# Patient Record
Sex: Male | Born: 1935 | Race: White | Hispanic: No | Smoking: Former smoker
Health system: Southern US, Community
[De-identification: ages and names within clinical notes are randomized; demographics above are authoritative.]

## PROBLEM LIST (undated history)

## (undated) DIAGNOSIS — I2510 Atherosclerotic heart disease of native coronary artery without angina pectoris: Secondary | ICD-10-CM

## (undated) DIAGNOSIS — N189 Chronic kidney disease, unspecified: Secondary | ICD-10-CM

## (undated) DIAGNOSIS — E785 Hyperlipidemia, unspecified: Secondary | ICD-10-CM

## (undated) DIAGNOSIS — I499 Cardiac arrhythmia, unspecified: Secondary | ICD-10-CM

## (undated) DIAGNOSIS — Z950 Presence of cardiac pacemaker: Secondary | ICD-10-CM

## (undated) DIAGNOSIS — E119 Type 2 diabetes mellitus without complications: Secondary | ICD-10-CM

## (undated) DIAGNOSIS — M1990 Unspecified osteoarthritis, unspecified site: Secondary | ICD-10-CM

## (undated) DIAGNOSIS — F419 Anxiety disorder, unspecified: Secondary | ICD-10-CM

## (undated) DIAGNOSIS — I219 Acute myocardial infarction, unspecified: Secondary | ICD-10-CM

## (undated) DIAGNOSIS — I10 Essential (primary) hypertension: Secondary | ICD-10-CM

## (undated) HISTORY — PX: CHOLECYSTECTOMY: SHX55

## (undated) HISTORY — PX: PACEMAKER INSERTION: SHX728

## (undated) HISTORY — PX: HEMORROIDECTOMY: SUR656

## (undated) HISTORY — PX: CORONARY ANGIOPLASTY: SHX604

---

## 2004-02-17 HISTORY — PX: KNEE SURGERY: SHX244

## 2012-02-17 DIAGNOSIS — I219 Acute myocardial infarction, unspecified: Secondary | ICD-10-CM

## 2012-02-17 HISTORY — DX: Acute myocardial infarction, unspecified: I21.9

## 2013-09-27 ENCOUNTER — Encounter (HOSPITAL_BASED_OUTPATIENT_CLINIC_OR_DEPARTMENT_OTHER): Payer: Self-pay | Admitting: Emergency Medicine

## 2013-09-27 ENCOUNTER — Emergency Department (HOSPITAL_BASED_OUTPATIENT_CLINIC_OR_DEPARTMENT_OTHER)
Admission: EM | Admit: 2013-09-27 | Discharge: 2013-09-27 | Disposition: A | Discharge status: Home / Self Care | Payer: Medicare Other | Attending: Emergency Medicine | Admitting: Emergency Medicine

## 2013-09-27 DIAGNOSIS — M25569 Pain in unspecified knee: Secondary | ICD-10-CM | POA: Diagnosis not present

## 2013-09-27 DIAGNOSIS — E119 Type 2 diabetes mellitus without complications: Secondary | ICD-10-CM | POA: Diagnosis not present

## 2013-09-27 DIAGNOSIS — I10 Essential (primary) hypertension: Secondary | ICD-10-CM | POA: Insufficient documentation

## 2013-09-27 DIAGNOSIS — Z7901 Long term (current) use of anticoagulants: Secondary | ICD-10-CM | POA: Insufficient documentation

## 2013-09-27 DIAGNOSIS — M25561 Pain in right knee: Secondary | ICD-10-CM

## 2013-09-27 DIAGNOSIS — E785 Hyperlipidemia, unspecified: Secondary | ICD-10-CM | POA: Diagnosis not present

## 2013-09-27 DIAGNOSIS — Z87891 Personal history of nicotine dependence: Secondary | ICD-10-CM | POA: Diagnosis not present

## 2013-09-27 DIAGNOSIS — I252 Old myocardial infarction: Secondary | ICD-10-CM | POA: Insufficient documentation

## 2013-09-27 DIAGNOSIS — Z79899 Other long term (current) drug therapy: Secondary | ICD-10-CM | POA: Diagnosis not present

## 2013-09-27 DIAGNOSIS — Z7902 Long term (current) use of antithrombotics/antiplatelets: Secondary | ICD-10-CM | POA: Diagnosis not present

## 2013-09-27 DIAGNOSIS — G8929 Other chronic pain: Secondary | ICD-10-CM | POA: Insufficient documentation

## 2013-09-27 DIAGNOSIS — F411 Generalized anxiety disorder: Secondary | ICD-10-CM | POA: Diagnosis not present

## 2013-09-27 HISTORY — DX: Anxiety disorder, unspecified: F41.9

## 2013-09-27 HISTORY — DX: Acute myocardial infarction, unspecified: I21.9

## 2013-09-27 HISTORY — DX: Type 2 diabetes mellitus without complications: E11.9

## 2013-09-27 HISTORY — DX: Essential (primary) hypertension: I10

## 2013-09-27 HISTORY — DX: Hyperlipidemia, unspecified: E78.5

## 2013-09-27 MED ORDER — OXYCODONE-ACETAMINOPHEN 5-325 MG PO TABS: 1.0000 | ORAL_TABLET | Freq: Four times a day (QID) | ORAL | Status: DC | PRN

## 2013-09-27 MED ORDER — OXYCODONE-ACETAMINOPHEN 5-325 MG PO TABS
1.0000 | ORAL_TABLET | Freq: Once | ORAL | Status: AC
  Administered 2013-09-27: 1 via ORAL
  Filled 2013-09-27: qty 1

## 2013-09-27 MED ORDER — NAPROXEN 250 MG PO TABS
500.0000 mg | ORAL_TABLET | Freq: Once | ORAL | Status: AC
  Administered 2013-09-27: 500 mg via ORAL
  Filled 2013-09-27: qty 2

## 2013-09-27 NOTE — ED Notes (Signed)
Pt is unable to ambulate and transfer to wheelchair and van.  [facility] called for transport.  Caregiver at bedside.

## 2013-09-27 NOTE — ED Notes (Signed)
Right knee pain that is chronic.  States pain kept him awake last night.

## 2013-09-27 NOTE — ED Provider Notes (Signed)
CSN: [id number]     Arrival date & time 09/27/13  1114 History   First MD Initiated Contact with Patient 09/27/13 1116     Chief Complaint  Patient presents with  . Knee Pain     (Consider location/radiation/quality/duration/timing/severity/associated sxs/prior Treatment) The history is provided by the patient.   patient has a history of a total right knee.  He reports chronic pain in his right knee over the past several months.  He states his pain exacerbated last night and was unrelieved by his Tylenol No. 3 that he usually takes for pain.  No recent illness.  No recent injury.  Denies fevers or chills.  Denies redness or skin changes.  Denies numbness or tingling distal to the right knee.  Denies new weakness of his right lower extremity.  No other complaints  Past Medical History  Diagnosis Date  . Diabetes mellitus without complication   . Anxiety   . MI (myocardial infarction)   . Hypertension   . Hyperlipidemia    Past Surgical History  Procedure Laterality Date  . Knee surgery    . Cholecystectomy    . Hemorroidectomy     No family history on file. History  Substance Use Topics  . Smoking status: Former Research scientist (life sciences)  . Smokeless tobacco: Not on file  . Alcohol Use: No    Review of Systems  All other systems reviewed and are negative.     Allergies  Review of patient's allergies indicates not on file.  Home Medications   Prior to Admission medications   Medication Sig Start Date End Date Taking? Authorizing Provider  acetaminophen-codeine (TYLENOL #3) 300-30 MG per tablet Take by mouth 2 (two) times daily as needed for moderate pain.   Yes Historical Provider, MD  atorvastatin (LIPITOR) 10 MG tablet Take 10 mg by mouth daily.   Yes Historical Provider, MD  busPIRone (BUSPAR) 15 MG tablet Take 15 mg by mouth 3 (three) times daily.   Yes Historical Provider, MD  carvedilol (COREG) 3.125 MG tablet Take 3.125 mg by mouth 2 (two) times daily with a meal.   Yes  Historical Provider, MD  clopidogrel (PLAVIX) 75 MG tablet Take 75 mg by mouth daily.   Yes Historical Provider, MD  ranitidine (ZANTAC) 150 MG capsule Take 150 mg by mouth 2 (two) times daily.   Yes Historical Provider, MD  warfarin (COUMADIN) 5 MG tablet Take 5 mg by mouth daily.   Yes Historical Provider, MD  oxyCODONE-acetaminophen (PERCOCET/ROXICET) 5-325 MG per tablet Take 1 tablet by mouth every 6 (six) hours as needed for severe pain. 09/27/13   Hoy Morn, MD   BP 130/64  Pulse 74  Temp(Src) 98.3 F (36.8 C) (Oral)  Resp 16  SpO2 100% Physical Exam  Nursing note and vitals reviewed. Constitutional: He is oriented to person, place, and time. He appears well-developed and well-nourished.  HENT:  Head: Normocephalic.  Eyes: EOM are normal.  Neck: Normal range of motion.  Pulmonary/Chest: Effort normal.  Abdominal: He exhibits no distension.  Musculoskeletal:  Mild to moderate joint effusion of the right knee.  No erythema or warmth.  Limited range of motion secondary to pain.  Neurological: He is alert and oriented to person, place, and time.  Psychiatric: He has a normal mood and affect.    ED Course  Procedures (including critical care time) Labs Review Labs Reviewed - No data to display  Imaging Review No results found.   EKG Interpretation None  MDM   Final diagnoses:  Chronic knee pain, right    Exacerbation of chronic pain.  No recent injury or trauma.  No indication for imaging.  Pain treated.  Home with a short course of Percocet.  I've asked that he call his orthopedic surgeon for followup    Hoy Morn, MD 09/27/13 1136

## 2013-09-27 NOTE — ED Notes (Signed)
[facility] at bedside for transport.  

## 2013-09-27 NOTE — ED Notes (Signed)
[facility] at bedside preparing for transport.

## 2013-12-19 ENCOUNTER — Inpatient Hospital Stay (HOSPITAL_COMMUNITY)
Admission: EM | Admit: 2013-12-19 | Discharge: 2013-12-23 | DRG: 690 | Disposition: A | Discharge status: Home Health | Payer: Medicare Other | Attending: Internal Medicine | Admitting: Internal Medicine

## 2013-12-19 ENCOUNTER — Encounter (HOSPITAL_COMMUNITY): Payer: Self-pay | Admitting: *Deleted

## 2013-12-19 DIAGNOSIS — N390 Urinary tract infection, site not specified: Secondary | ICD-10-CM | POA: Diagnosis not present

## 2013-12-19 DIAGNOSIS — Z7901 Long term (current) use of anticoagulants: Secondary | ICD-10-CM

## 2013-12-19 DIAGNOSIS — N179 Acute kidney failure, unspecified: Secondary | ICD-10-CM | POA: Diagnosis present

## 2013-12-19 DIAGNOSIS — B9620 Unspecified Escherichia coli [E. coli] as the cause of diseases classified elsewhere: Secondary | ICD-10-CM | POA: Diagnosis present

## 2013-12-19 DIAGNOSIS — I10 Essential (primary) hypertension: Secondary | ICD-10-CM | POA: Diagnosis present

## 2013-12-19 DIAGNOSIS — E785 Hyperlipidemia, unspecified: Secondary | ICD-10-CM | POA: Diagnosis present

## 2013-12-19 DIAGNOSIS — L89151 Pressure ulcer of sacral region, stage 1: Secondary | ICD-10-CM | POA: Diagnosis present

## 2013-12-19 DIAGNOSIS — Z86718 Personal history of other venous thrombosis and embolism: Secondary | ICD-10-CM

## 2013-12-19 DIAGNOSIS — Z950 Presence of cardiac pacemaker: Secondary | ICD-10-CM

## 2013-12-19 DIAGNOSIS — Z79899 Other long term (current) drug therapy: Secondary | ICD-10-CM

## 2013-12-19 DIAGNOSIS — E860 Dehydration: Secondary | ICD-10-CM | POA: Diagnosis present

## 2013-12-19 DIAGNOSIS — K219 Gastro-esophageal reflux disease without esophagitis: Secondary | ICD-10-CM | POA: Diagnosis present

## 2013-12-19 DIAGNOSIS — R1030 Lower abdominal pain, unspecified: Secondary | ICD-10-CM | POA: Diagnosis not present

## 2013-12-19 DIAGNOSIS — R739 Hyperglycemia, unspecified: Secondary | ICD-10-CM | POA: Diagnosis present

## 2013-12-19 DIAGNOSIS — E1121 Type 2 diabetes mellitus with diabetic nephropathy: Secondary | ICD-10-CM | POA: Diagnosis present

## 2013-12-19 DIAGNOSIS — R791 Abnormal coagulation profile: Secondary | ICD-10-CM | POA: Diagnosis present

## 2013-12-19 DIAGNOSIS — Z7902 Long term (current) use of antithrombotics/antiplatelets: Secondary | ICD-10-CM

## 2013-12-19 DIAGNOSIS — R7989 Other specified abnormal findings of blood chemistry: Secondary | ICD-10-CM | POA: Diagnosis present

## 2013-12-19 DIAGNOSIS — I2510 Atherosclerotic heart disease of native coronary artery without angina pectoris: Secondary | ICD-10-CM | POA: Diagnosis present

## 2013-12-19 DIAGNOSIS — I252 Old myocardial infarction: Secondary | ICD-10-CM

## 2013-12-19 DIAGNOSIS — R531 Weakness: Secondary | ICD-10-CM

## 2013-12-19 DIAGNOSIS — F419 Anxiety disorder, unspecified: Secondary | ICD-10-CM | POA: Diagnosis present

## 2013-12-19 DIAGNOSIS — L8990 Pressure ulcer of unspecified site, unspecified stage: Secondary | ICD-10-CM | POA: Diagnosis present

## 2013-12-19 DIAGNOSIS — E119 Type 2 diabetes mellitus without complications: Secondary | ICD-10-CM

## 2013-12-19 NOTE — ED Notes (Signed)
CBG 207, HR 60-70's, BP 132/65

## 2013-12-19 NOTE — ED Notes (Signed)
Per EMS pt from assisted living, staff reported pt started acting different, reported had dark/cloudy urine, today is having generalized weakness and no appetite today, then this evening noticed pt was incontinent in bed which is not normal and diaphoretic, pt report some abdominal pain, pt has UTI smell per EMS.

## 2013-12-20 ENCOUNTER — Inpatient Hospital Stay (HOSPITAL_COMMUNITY): Payer: Medicare Other

## 2013-12-20 ENCOUNTER — Encounter (HOSPITAL_COMMUNITY): Payer: Self-pay

## 2013-12-20 ENCOUNTER — Emergency Department (HOSPITAL_COMMUNITY): Payer: Medicare Other

## 2013-12-20 DIAGNOSIS — I252 Old myocardial infarction: Secondary | ICD-10-CM

## 2013-12-20 DIAGNOSIS — Z7902 Long term (current) use of antithrombotics/antiplatelets: Secondary | ICD-10-CM | POA: Diagnosis not present

## 2013-12-20 DIAGNOSIS — R7989 Other specified abnormal findings of blood chemistry: Secondary | ICD-10-CM | POA: Diagnosis present

## 2013-12-20 DIAGNOSIS — Z7901 Long term (current) use of anticoagulants: Secondary | ICD-10-CM | POA: Diagnosis not present

## 2013-12-20 DIAGNOSIS — K219 Gastro-esophageal reflux disease without esophagitis: Secondary | ICD-10-CM | POA: Diagnosis present

## 2013-12-20 DIAGNOSIS — I10 Essential (primary) hypertension: Secondary | ICD-10-CM | POA: Diagnosis present

## 2013-12-20 DIAGNOSIS — Z79899 Other long term (current) drug therapy: Secondary | ICD-10-CM | POA: Diagnosis not present

## 2013-12-20 DIAGNOSIS — R739 Hyperglycemia, unspecified: Secondary | ICD-10-CM | POA: Diagnosis present

## 2013-12-20 DIAGNOSIS — Z950 Presence of cardiac pacemaker: Secondary | ICD-10-CM | POA: Diagnosis not present

## 2013-12-20 DIAGNOSIS — I219 Acute myocardial infarction, unspecified: Secondary | ICD-10-CM | POA: Insufficient documentation

## 2013-12-20 DIAGNOSIS — N390 Urinary tract infection, site not specified: Principal | ICD-10-CM | POA: Diagnosis present

## 2013-12-20 DIAGNOSIS — Z86718 Personal history of other venous thrombosis and embolism: Secondary | ICD-10-CM

## 2013-12-20 DIAGNOSIS — E785 Hyperlipidemia, unspecified: Secondary | ICD-10-CM | POA: Diagnosis present

## 2013-12-20 DIAGNOSIS — R791 Abnormal coagulation profile: Secondary | ICD-10-CM | POA: Diagnosis present

## 2013-12-20 DIAGNOSIS — R531 Weakness: Secondary | ICD-10-CM

## 2013-12-20 DIAGNOSIS — F419 Anxiety disorder, unspecified: Secondary | ICD-10-CM | POA: Diagnosis present

## 2013-12-20 DIAGNOSIS — N179 Acute kidney failure, unspecified: Secondary | ICD-10-CM | POA: Diagnosis present

## 2013-12-20 DIAGNOSIS — L8990 Pressure ulcer of unspecified site, unspecified stage: Secondary | ICD-10-CM | POA: Diagnosis present

## 2013-12-20 DIAGNOSIS — L89151 Pressure ulcer of sacral region, stage 1: Secondary | ICD-10-CM | POA: Diagnosis present

## 2013-12-20 DIAGNOSIS — E1121 Type 2 diabetes mellitus with diabetic nephropathy: Secondary | ICD-10-CM | POA: Diagnosis present

## 2013-12-20 DIAGNOSIS — R1030 Lower abdominal pain, unspecified: Secondary | ICD-10-CM | POA: Diagnosis present

## 2013-12-20 DIAGNOSIS — B9620 Unspecified Escherichia coli [E. coli] as the cause of diseases classified elsewhere: Secondary | ICD-10-CM | POA: Diagnosis present

## 2013-12-20 DIAGNOSIS — E860 Dehydration: Secondary | ICD-10-CM | POA: Diagnosis present

## 2013-12-20 DIAGNOSIS — I2510 Atherosclerotic heart disease of native coronary artery without angina pectoris: Secondary | ICD-10-CM | POA: Diagnosis present

## 2013-12-20 LAB — URINALYSIS, ROUTINE W REFLEX MICROSCOPIC
Bilirubin Urine: NEGATIVE
Glucose, UA: NEGATIVE mg/dL
Ketones, ur: NEGATIVE mg/dL
Nitrite: POSITIVE — AB
Protein, ur: 30 mg/dL — AB
SPECIFIC GRAVITY, URINE: 1.018 (ref 1.005–1.030)
UROBILINOGEN UA: 0.2 mg/dL (ref 0.0–1.0)
pH: 5 (ref 5.0–8.0)

## 2013-12-20 LAB — COMPREHENSIVE METABOLIC PANEL
ALT: 16 U/L (ref 0–53)
AST: 20 U/L (ref 0–37)
Albumin: 3.2 g/dL — ABNORMAL LOW (ref 3.5–5.2)
Alkaline Phosphatase: 77 U/L (ref 39–117)
Anion gap: 15 (ref 5–15)
BUN: 24 mg/dL — ABNORMAL HIGH (ref 6–23)
CO2: 22 mEq/L (ref 19–32)
Calcium: 9.6 mg/dL (ref 8.4–10.5)
Chloride: 100 mEq/L (ref 96–112)
Creatinine, Ser: 1.52 mg/dL — ABNORMAL HIGH (ref 0.50–1.35)
GFR calc non Af Amer: 39 mL/min — ABNORMAL LOW (ref >90)
GFR, EST AFRICAN AMERICAN: 46 mL/min — AB (ref >90)
GLUCOSE: 189 mg/dL — AB (ref 70–99)
Potassium: 4.7 mEq/L (ref 3.7–5.3)
Sodium: 137 mEq/L (ref 137–147)
TOTAL PROTEIN: 8 g/dL (ref 6.0–8.3)
Total Bilirubin: 0.4 mg/dL (ref 0.3–1.2)

## 2013-12-20 LAB — CBC
HCT: 38.8 % — ABNORMAL LOW (ref 39.0–52.0)
HEMOGLOBIN: 12.7 g/dL — AB (ref 13.0–17.0)
MCH: 27.5 pg (ref 26.0–34.0)
MCHC: 32.7 g/dL (ref 30.0–36.0)
MCV: 84.2 fL (ref 78.0–100.0)
Platelets: 291 10*3/uL (ref 150–400)
RBC: 4.61 MIL/uL (ref 4.22–5.81)
RDW: 15.8 % — ABNORMAL HIGH (ref 11.5–15.5)
WBC: 6.4 10*3/uL (ref 4.0–10.5)

## 2013-12-20 LAB — PROTIME-INR
INR: 1.41 (ref 0.00–1.49)
INR: 1.43 (ref 0.00–1.49)
PROTHROMBIN TIME: 17.4 s — AB (ref 11.6–15.2)
Prothrombin Time: 17.6 seconds — ABNORMAL HIGH (ref 11.6–15.2)

## 2013-12-20 LAB — URINE MICROSCOPIC-ADD ON

## 2013-12-20 LAB — GLUCOSE, CAPILLARY
GLUCOSE-CAPILLARY: 191 mg/dL — AB (ref 70–99)
GLUCOSE-CAPILLARY: 168 mg/dL — AB (ref 70–99)
Glucose-Capillary: 165 mg/dL — ABNORMAL HIGH (ref 70–99)

## 2013-12-20 LAB — I-STAT CG4 LACTIC ACID, ED: Lactic Acid, Venous: 1.72 mmol/L (ref 0.5–2.2)

## 2013-12-20 LAB — SODIUM, URINE, RANDOM: SODIUM UR: 84 meq/L

## 2013-12-20 LAB — CBC WITH DIFFERENTIAL/PLATELET
BASOS ABS: 0 10*3/uL (ref 0.0–0.1)
Basophils Relative: 1 % (ref 0–1)
EOS ABS: 0 10*3/uL (ref 0.0–0.7)
EOS PCT: 0 % (ref 0–5)
HCT: 40.2 % (ref 39.0–52.0)
Hemoglobin: 13.3 g/dL (ref 13.0–17.0)
Lymphocytes Relative: 19 % (ref 12–46)
Lymphs Abs: 1.3 10*3/uL (ref 0.7–4.0)
MCH: 27.5 pg (ref 26.0–34.0)
MCHC: 33.1 g/dL (ref 30.0–36.0)
MCV: 83.1 fL (ref 78.0–100.0)
Monocytes Absolute: 0.7 10*3/uL (ref 0.1–1.0)
Monocytes Relative: 10 % (ref 3–12)
Neutro Abs: 4.8 10*3/uL (ref 1.7–7.7)
Neutrophils Relative %: 70 % (ref 43–77)
PLATELETS: 325 10*3/uL (ref 150–400)
RBC: 4.84 MIL/uL (ref 4.22–5.81)
RDW: 15.8 % — AB (ref 11.5–15.5)
WBC: 6.8 10*3/uL (ref 4.0–10.5)

## 2013-12-20 LAB — PRO B NATRIURETIC PEPTIDE: PRO B NATRI PEPTIDE: 3532 pg/mL — AB (ref 0–450)

## 2013-12-20 LAB — CREATININE, URINE, RANDOM: Creatinine, Urine: 145.7 mg/dL

## 2013-12-20 LAB — BASIC METABOLIC PANEL
ANION GAP: 15 (ref 5–15)
BUN: 24 mg/dL — AB (ref 6–23)
CHLORIDE: 105 meq/L (ref 96–112)
CO2: 23 mEq/L (ref 19–32)
Calcium: 9.3 mg/dL (ref 8.4–10.5)
Creatinine, Ser: 1.66 mg/dL — ABNORMAL HIGH (ref 0.50–1.35)
GFR calc Af Amer: 41 mL/min — ABNORMAL LOW (ref >90)
GFR, EST NON AFRICAN AMERICAN: 35 mL/min — AB (ref >90)
Glucose, Bld: 158 mg/dL — ABNORMAL HIGH (ref 70–99)
POTASSIUM: 4.5 meq/L (ref 3.7–5.3)
Sodium: 143 mEq/L (ref 137–147)

## 2013-12-20 LAB — SEDIMENTATION RATE: Sed Rate: 40 mm/hr — ABNORMAL HIGH (ref 0–16)

## 2013-12-20 LAB — HEMOGLOBIN A1C
HEMOGLOBIN A1C: 7.7 % — AB (ref <5.7)
Mean Plasma Glucose: 174 mg/dL — ABNORMAL HIGH (ref <117)

## 2013-12-20 LAB — TROPONIN I

## 2013-12-20 LAB — APTT: aPTT: 35 seconds (ref 24–37)

## 2013-12-20 MED ORDER — BUSPIRONE HCL 15 MG PO TABS
15.0000 mg | ORAL_TABLET | Freq: Three times a day (TID) | ORAL | Status: DC
  Administered 2013-12-20 – 2013-12-23 (×11): 15 mg via ORAL
  Filled 2013-12-20 (×12): qty 1

## 2013-12-20 MED ORDER — CLOPIDOGREL BISULFATE 75 MG PO TABS
75.0000 mg | ORAL_TABLET | Freq: Every day | ORAL | Status: DC
  Administered 2013-12-20 – 2013-12-23 (×4): 75 mg via ORAL
  Filled 2013-12-20 (×4): qty 1

## 2013-12-20 MED ORDER — INSULIN ASPART 100 UNIT/ML ~~LOC~~ SOLN
0.0000 [IU] | Freq: Every day | SUBCUTANEOUS | Status: DC
  Administered 2013-12-20: 3 [IU] via SUBCUTANEOUS

## 2013-12-20 MED ORDER — FAMOTIDINE 20 MG PO TABS
20.0000 mg | ORAL_TABLET | Freq: Every day | ORAL | Status: DC
  Administered 2013-12-20 – 2013-12-23 (×4): 20 mg via ORAL
  Filled 2013-12-20 (×4): qty 1

## 2013-12-20 MED ORDER — INSULIN ASPART 100 UNIT/ML ~~LOC~~ SOLN
0.0000 [IU] | Freq: Three times a day (TID) | SUBCUTANEOUS | Status: DC
  Administered 2013-12-20 – 2013-12-21 (×5): 2 [IU] via SUBCUTANEOUS
  Administered 2013-12-22: 1 [IU] via SUBCUTANEOUS
  Administered 2013-12-22: 2 [IU] via SUBCUTANEOUS
  Administered 2013-12-22: 1 [IU] via SUBCUTANEOUS

## 2013-12-20 MED ORDER — ATORVASTATIN CALCIUM 10 MG PO TABS
10.0000 mg | ORAL_TABLET | Freq: Every day | ORAL | Status: DC
  Administered 2013-12-20 – 2013-12-22 (×3): 10 mg via ORAL
  Filled 2013-12-20 (×4): qty 1

## 2013-12-20 MED ORDER — CETYLPYRIDINIUM CHLORIDE 0.05 % MT LIQD
7.0000 mL | Freq: Two times a day (BID) | OROMUCOSAL | Status: DC
  Administered 2013-12-20 – 2013-12-23 (×7): 7 mL via OROMUCOSAL

## 2013-12-20 MED ORDER — ENOXAPARIN SODIUM 80 MG/0.8ML ~~LOC~~ SOLN
1.0000 mg/kg | SUBCUTANEOUS | Status: DC
  Administered 2013-12-20 – 2013-12-21 (×2): 75 mg via SUBCUTANEOUS
  Filled 2013-12-20 (×3): qty 0.8

## 2013-12-20 MED ORDER — ACETAMINOPHEN-CODEINE #3 300-30 MG PO TABS
1.0000 | ORAL_TABLET | Freq: Three times a day (TID) | ORAL | Status: DC | PRN
Start: 2013-12-20 — End: 2013-12-23
  Administered 2013-12-21: 1 via ORAL
  Filled 2013-12-20: qty 1

## 2013-12-20 MED ORDER — DEXTROSE 5 % IV SOLN
1.0000 g | Freq: Every day | INTRAVENOUS | Status: DC
  Administered 2013-12-20 – 2013-12-22 (×3): 1 g via INTRAVENOUS
  Filled 2013-12-20 (×5): qty 10

## 2013-12-20 MED ORDER — WARFARIN SODIUM 5 MG PO TABS
5.0000 mg | ORAL_TABLET | Freq: Once | ORAL | Status: AC
  Administered 2013-12-20: 5 mg via ORAL
  Filled 2013-12-20: qty 1

## 2013-12-20 MED ORDER — WARFARIN - PHARMACIST DOSING INPATIENT: Freq: Every day | Status: DC

## 2013-12-20 MED ORDER — CARVEDILOL 3.125 MG PO TABS
3.1250 mg | ORAL_TABLET | Freq: Two times a day (BID) | ORAL | Status: DC
  Administered 2013-12-20 – 2013-12-23 (×8): 3.125 mg via ORAL
  Filled 2013-12-20 (×9): qty 1

## 2013-12-20 MED ORDER — ENSURE COMPLETE PO LIQD
237.0000 mL | Freq: Two times a day (BID) | ORAL | Status: DC
  Administered 2013-12-20 – 2013-12-23 (×6): 237 mL via ORAL

## 2013-12-20 MED ORDER — DEXTROSE 5 % IV SOLN
1.0000 g | Freq: Once | INTRAVENOUS | Status: AC
  Administered 2013-12-20: 1 g via INTRAVENOUS
  Filled 2013-12-20: qty 10

## 2013-12-20 MED ORDER — OXYCODONE-ACETAMINOPHEN 5-325 MG PO TABS: 1.0000 | ORAL_TABLET | Freq: Four times a day (QID) | ORAL | Status: DC | PRN

## 2013-12-20 MED ORDER — HEPARIN (PORCINE) IN NACL 100-0.45 UNIT/ML-% IJ SOLN
1150.0000 [IU]/h | INTRAMUSCULAR | Status: DC
  Administered 2013-12-20: 1150 [IU]/h via INTRAVENOUS
  Filled 2013-12-20: qty 250

## 2013-12-20 MED ORDER — SODIUM CHLORIDE 0.9 % IV SOLN
INTRAVENOUS | Status: DC
  Administered 2013-12-20 – 2013-12-22 (×3): 1000 mL via INTRAVENOUS

## 2013-12-20 NOTE — Progress Notes (Signed)
INITIAL NUTRITION ASSESSMENT  DOCUMENTATION CODES Per approved criteria  -Not Applicable   INTERVENTION: Heart Healthy diet Ensure Complete po BID, each supplement provides 350 kcal and 13 grams of protein RD to follow.  NUTRITION DIAGNOSIS: Predicted sub optimal intake related to caring for himself as evidenced by weight loss.   Goal: Intake of meals and supplements to meet >90% estimated needs.  Monitor:  Intake, labs, weight trend  Reason for Assessment: low braden  80 y.o. male  Admitting Dx: Acute UTI  ASSESSMENT: Patient admitted with UTI.   Patient reports good intake currently and prior to admit. Good appetite.  Lives alone and cooks for self. Reports UBW of 200 lbs "maybe" but unable to state when Stage 1 persistent erythema at sacrum and buttocks.  Height: Ht Readings from Last 1 Encounters:  12/19/13 5\' 7"  (1.702 m)    Weight: Wt Readings from Last 1 Encounters:  12/19/13 170 lb (77.111 kg)    Ideal Body Weight: 148 lbs  % Ideal Body Weight: 115  Wt Readings from Last 10 Encounters:  12/19/13 170 lb (77.111 kg)    Usual Body Weight: 200 lbs  % Usual Body Weight: 85  BMI:  Body mass index is 26.62 kg/(m^2).  Estimated Nutritional Needs: Kcal: 2000-2100 Protein: 75-85 gm Fluid: >/=2L daily  Skin: stage 1 pressure ulcer, persistent erythema at sacrum and buttocks  Diet Order: Diet Heart  EDUCATION NEEDS: -No education needs identified at this time   Intake/Output Summary (Last 24 hours) at 12/20/13 1045 Last data filed at 12/20/13 0530  Gross per 24 hour  Intake    120 ml  Output    300 ml  Net   -180 ml     Labs:   Recent Labs Lab 12/20/13 0128 12/20/13 0725  NA 137 143  K 4.7 4.5  CL 100 105  CO2 22 23  BUN 24* 24*  CREATININE 1.52* 1.66*  CALCIUM 9.6 9.3  GLUCOSE 189* 158*    CBG (last 3)   Recent Labs  12/20/13 0737  GLUCAP 165*    Scheduled Meds: . antiseptic oral rinse  7 mL Mouth Rinse BID  .  atorvastatin  10 mg Oral q1800  . busPIRone  15 mg Oral TID  . carvedilol  3.125 mg Oral BID WC  . cefTRIAXone (ROCEPHIN)  IV  1 g Intravenous QHS  . clopidogrel  75 mg Oral Daily  . enoxaparin (LOVENOX) injection  1 mg/kg Subcutaneous Q24H  . famotidine  20 mg Oral Daily  . insulin aspart  0-5 Units Subcutaneous QHS  . insulin aspart  0-9 Units Subcutaneous TID WC  . Warfarin - Pharmacist Dosing Inpatient   Does not apply q1800    Continuous Infusions: . sodium chloride 100 mL/hr at 12/20/13 0500    Past Medical History  Diagnosis Date  . Diabetes mellitus without complication   . Anxiety   . MI (myocardial infarction)   . Hypertension   . Hyperlipidemia     Past Surgical History  Procedure Laterality Date  . Knee surgery    . Cholecystectomy    . Hemorroidectomy      Antonieta Iba, RD, LDN Clinical Inpatient Dietitian Pager:  [phone] Weekend and after hours pager:  [phone]

## 2013-12-20 NOTE — ED Notes (Addendum)
Enos Fling [phone] (sits with patient)

## 2013-12-20 NOTE — Progress Notes (Addendum)
ANTICOAGULATION CONSULT NOTE - Initial Consult  Pharmacy Consult for Lovenox, warfarin Indication: history of DVT  Not on File  Patient Measurements: Height: 5\' 7"  (170.2 cm) Weight: 170 lb (77.111 kg) IBW/kg (Calculated) : 66.1  Vital Signs: Temp: 98.3 F (36.8 C) (11/04 0512) Temp Source: Oral (11/04 0512) BP: 126/52 mmHg (11/04 0512) Pulse Rate: 69 (11/04 0512)  Labs:  Recent Labs  12/20/13 0128 12/20/13 0725  HGB 13.3 12.7*  HCT 40.2 38.8*  PLT 325 291  APTT 35  --   LABPROT 17.4* 17.6*  INR 1.41 1.43  CREATININE 1.52* 1.66*  TROPONINI <0.30  --     Estimated Creatinine Clearance: 29.3 mL/min (by C-G formula based on Cr of 1.66).   Medical History: Past Medical History  Diagnosis Date  . Diabetes mellitus without complication   . Anxiety   . MI (myocardial infarction)   . Hypertension   . Hyperlipidemia     Medications:  Scheduled:  . atorvastatin  10 mg Oral q1800  . busPIRone  15 mg Oral TID  . carvedilol  3.125 mg Oral BID WC  . cefTRIAXone (ROCEPHIN)  IV  1 g Intravenous QHS  . clopidogrel  75 mg Oral Daily  . famotidine  20 mg Oral Daily  . insulin aspart  0-5 Units Subcutaneous QHS  . insulin aspart  0-9 Units Subcutaneous TID WC  . Warfarin - Pharmacist Dosing Inpatient   Does not apply q1800   Infusions:  . sodium chloride      Assessment: 80 yo male presented to ER early this AM with lower abd pain. Per notes, caregiver states that he is not walking and eating well as usual. Also has chronic right leg edema and tenderness in lower leg. Patient found to have UTI started on antibiotics. Note that patient was on warfarin prior to admission and INR was subtherapeutic on admission. Started on IV heparin and now transitioning to full dose Lovenox until INR therapeutic   Warfarin dose PTA: 5mg  daily with last dose AM 11/3  INR subtherapeutic  CBC ok  No reported bleeding  Patient also with positive UA on Rocephin  Goal of Therapy:   INR 2-3 Anti-Xa level 0.6-1 units/ml 4hrs after LMWH dose given Monitor platelets by anticoagulation protocol: Yes   Plan:  1) D/C IV heparin 2) 1 hour after heparin stopped, start Lovenox 1mg /kg (75mg ) q24 due to CrCl < 30 ml/min at this point 3) Monitor renal function for any possible Lovenox dosage changes 4) D/C Lovenox when INR therapeutic 5) Warfarin 5mg  tonight 6) Daily INR  Adrian Saran, PharmD, BCPS Pager [phone] 12/20/2013 9:47 AM

## 2013-12-20 NOTE — H&P (Addendum)
[facility] History and Physical  Kristi Hyer DJT:[id number] DOB: February 19, 1924 DOA: 12/19/2013  Referring physician: ED physician PCP: No primary care provider on file.  Specialists:   Chief Complaint: lower abdominal pain  HPI: Randy [name] is a 80 y.o. 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  Patient reports that he started having suprapubic abdominal pain this morning. He denies dysuria or burning on urination. He states that he has been having urinary frequency for more than a month. He does not have fever, chills, nausea, vomiting, diarrhea. He denies chest pain, cough, shortness breath.  Per caregiver, pt is usually active, and able to ambulate, but today, he is not walking and eating well. Patient was drenched in sweats at one point. Patient has chronic right leg edema and tenderness in the right lower leg. Of note, he has hx of right leg DVT per patient. He is supposed to be on coumadin, but his INR is 1.41 on admission.   Patient is found to have positive urinalysis for UTI. No leukocytosis. Negative chest x-ray. He is admitted to inpatient for further evaluation and treatment.  Review of Systems: As presented in the history of presenting illness, rest negative.  Where does patient live? Lives at home  Can patient participate in ADLs? little  Allergy: Not on File  Past Medical History  Diagnosis Date  . Diabetes mellitus without complication   . Anxiety   . MI (myocardial infarction)   . Hypertension   . Hyperlipidemia     Past Surgical History  Procedure Laterality Date  . Knee surgery    . Cholecystectomy    . Hemorroidectomy      Social History:  reports that he has quit smoking. He does not have any smokeless tobacco history on file. He reports that he does not drink alcohol or use illicit drugs.  Family  History: No family history on file.   Prior to Admission medications   Medication Sig Start Date End Date Taking? Authorizing Provider  acetaminophen-codeine (TYLENOL #3) 300-30 MG per tablet Take by mouth 2 (two) times daily as needed for moderate pain.    Historical Provider, MD  atorvastatin (LIPITOR) 10 MG tablet Take 10 mg by mouth daily.    Historical Provider, MD  busPIRone (BUSPAR) 15 MG tablet Take 15 mg by mouth 3 (three) times daily.    Historical Provider, MD  carvedilol (COREG) 3.125 MG tablet Take 3.125 mg by mouth 2 (two) times daily with a meal.    Historical Provider, MD  clopidogrel (PLAVIX) 75 MG tablet Take 75 mg by mouth daily.    Historical Provider, MD  oxyCODONE-acetaminophen (PERCOCET/ROXICET) 5-325 MG per tablet Take 1 tablet by mouth every 6 (six) hours as needed for severe pain. 09/27/13   Hoy Morn, MD  ranitidine (ZANTAC) 150 MG capsule Take 150 mg by mouth 2 (two) times daily.    Historical Provider, MD  warfarin (COUMADIN) 5 MG tablet Take 5 mg by mouth daily.    Historical Provider, MD    Physical Exam: Filed Vitals:   12/19/13 2316  BP: 152/68  Pulse: 69  Temp: 98.6 F (37 C)  TempSrc: Oral  Resp: 18  Height: 5\' 7"  (1.702 m)  Weight: 77.111 kg (170 lb)  SpO2: 100%   General: Not in acute distress. Dry mucus and membrane HEENT:       Eyes: PERRL, EOMI,  no scleral icterus       ENT: No discharge from the ears and nose, no pharynx injection, no tonsillar enlargement.        Neck: No JVD, no bruit, no mass felt. Cardiac: remote heart sound S1/S2, RRR, No murmurs, gallops or rubs Pulm: Good air movement bilaterally. Clear to auscultation bilaterally. No rales, wheezing, rhonchi or rubs. Abd: Soft, nondistended, mildly tender over suprapubic area, no rebound pain, no organomegaly, BS present. No CVA tenderness Ext: 1+ pitting right leg edema and tender over right lower leg. 2+DP/PT pulse bilaterally Musculoskeletal: No joint deformities, erythema,  or stiffness, ROM full Skin: Sacral erythema without tenderness, or fluctuance.  Neuro: Alert and oriented X3, cranial nerves II-XII grossly intact, muscle strength 5/5 in all extremeties, sensation to light touch intact. Moves all extremities. Psych: Patient is not psychotic, no suicidal or hemocidal ideation.  Labs on Admission:  Basic Metabolic Panel:  Recent Labs Lab 12/20/13 0128  NA 137  K 4.7  CL 100  CO2 22  GLUCOSE 189*  BUN 24*  CREATININE 1.52*  CALCIUM 9.6   Liver Function Tests:  Recent Labs Lab 12/20/13 0128  AST 20  ALT 16  ALKPHOS 77  BILITOT 0.4  PROT 8.0  ALBUMIN 3.2*   No results for input(s): LIPASE, AMYLASE in the last 168 hours. No results for input(s): AMMONIA in the last 168 hours. CBC:  Recent Labs Lab 12/20/13 0128  WBC 6.8  NEUTROABS 4.8  HGB 13.3  HCT 40.2  MCV 83.1  PLT 325   Cardiac Enzymes:  Recent Labs Lab 12/20/13 0128  TROPONINI <0.30    BNP (last 3 results) No results for input(s): PROBNP in the last 8760 hours. CBG: No results for input(s): GLUCAP in the last 168 hours.  Radiological Exams on Admission: Dg Chest 2 View  12/20/2013   CLINICAL DATA:  Weakness generalized.  UTI.  Fever.  EXAM: CHEST  2 VIEW  COMPARISON:  None.  FINDINGS: Left dual lead cardiac pacemaker. Heart size is upper limits of normal. Lungs are clear without airspace disease or pulmonary edema. Degenerative changes in the thoracic spine. No definite pleural effusions.  IMPRESSION: No acute chest abnormality.   Electronically Signed   By: Markus Daft M.D.   On: 12/20/2013 01:11    EKG: Independently reviewed.   Assessment/Plan Principal Problem:   Acute UTI Active Problems:   Hyperlipidemia   Hypertension   History of MI (myocardial infarction)   UTI (lower urinary tract infection)  UTI:  He has positive UA for UTI and urinary frequency. Currently patient is not septic. He is hemodynamically stable. Lactate is 1.7 to admission. - Admit  to MedSurg bed - Started Rocephin IV - IVF: ns 100 cc/h - blood culture x 2 - urine culture  Hx of MI: s/p stent placement per patient. On plavix at home. No chest pain on admission. - continue Plavix - Continue Lipitor, Coreg  HTN:  -continue Coreg.  Hx of R leg DVT: patient is supposed to be on Coumadin, however his INR is 1.41. He still has right leg edema and tenderness. - will start heparin IV for bridging  - coumadin per pharmacy  Decubitus ulcer: Sacral erythema without tenderness, or fluctuance.  -will consult to wound care  Elevated cre: cre is 1.52 on admission. No previous record. Likely due to dehydration. - IVF: ns 100 cc/h - check FaNa - BMP in AM   DVT ppx: On IV  Heparin    GERD:  pepcid  Code Status: Full code. I discussed with patient about code status, he is not ready to make decision now. Will be full code now.  Family Communication: None at bed side.   Disposition Plan: Admit to inpatient   Date of Service 12/20/2013    Ivor Costa [facility] Pager [phone]  If 7PM-7AM, please contact night-coverage [email or web address] Password [email or web address] 12/20/2013, 4:20 AM

## 2013-12-20 NOTE — ED Provider Notes (Signed)
CSN: [id number]     Arrival date & time 12/19/13  2303 History   First MD Initiated Contact with Patient 12/19/13 2339     Chief Complaint  Patient presents with  . Weakness  . Urinary Incontinence    foul odor (urine)     (Consider location/radiation/quality/duration/timing/severity/associated sxs/prior Treatment) HPI Comments: PT comes in with cc of weakness, loss of appetite. Pt has hx of dementia, poor historian, however, caregiver at bedside. Pt's POA is son, who we have been unable to reach.  Per caregiver, pt is usually active, and able to ambulate, but today, he is not walking and not eating. She also noticed that he was drenched in sweats at one point and he was working hard to breath when he was being helped to get out of the bed. Pt is also having incontinence, and his urine is foul smelling. Pt has hx of CAD with stents, ? DVT hx, on coumadin, and diet controlled DM.  Patient is a 80 y.o. male presenting with weakness. The history is provided by the patient.  Weakness Associated symptoms include shortness of breath. Pertinent negatives include no chest pain and no abdominal pain.    Past Medical History  Diagnosis Date  . Diabetes mellitus without complication   . Anxiety   . MI (myocardial infarction)   . Hypertension   . Hyperlipidemia    Past Surgical History  Procedure Laterality Date  . Knee surgery    . Cholecystectomy    . Hemorroidectomy     No family history on file. History  Substance Use Topics  . Smoking status: Former Research scientist (life sciences)  . Smokeless tobacco: Not on file  . Alcohol Use: No    Review of Systems  Constitutional: Positive for diaphoresis, activity change and fatigue. Negative for fever and appetite change.  Respiratory: Positive for shortness of breath. Negative for cough.   Cardiovascular: Negative for chest pain.  Gastrointestinal: Negative for abdominal pain.  Genitourinary: Negative for dysuria and hematuria.  Neurological: Positive for  weakness.      Allergies  Review of patient's allergies indicates not on file.  Home Medications   Prior to Admission medications   Medication Sig Start Date End Date Taking? Authorizing Provider  acetaminophen-codeine (TYLENOL #3) 300-30 MG per tablet Take by mouth 2 (two) times daily as needed for moderate pain.    Historical Provider, MD  atorvastatin (LIPITOR) 10 MG tablet Take 10 mg by mouth daily.    Historical Provider, MD  busPIRone (BUSPAR) 15 MG tablet Take 15 mg by mouth 3 (three) times daily.    Historical Provider, MD  carvedilol (COREG) 3.125 MG tablet Take 3.125 mg by mouth 2 (two) times daily with a meal.    Historical Provider, MD  clopidogrel (PLAVIX) 75 MG tablet Take 75 mg by mouth daily.    Historical Provider, MD  oxyCODONE-acetaminophen (PERCOCET/ROXICET) 5-325 MG per tablet Take 1 tablet by mouth every 6 (six) hours as needed for severe pain. 09/27/13   Hoy Morn, MD  ranitidine (ZANTAC) 150 MG capsule Take 150 mg by mouth 2 (two) times daily.    Historical Provider, MD  warfarin (COUMADIN) 5 MG tablet Take 5 mg by mouth daily.    Historical Provider, MD   BP 152/68 mmHg  Pulse 69  Temp(Src) 98.6 F (37 C) (Oral)  Resp 18  Ht 5\' 7"  (1.702 m)  Wt 170 lb (77.111 kg)  BMI 26.62 kg/m2  SpO2 100% Physical Exam  Constitutional: He appears well-developed.  HENT:  Head: Normocephalic and atraumatic.  Eyes: Conjunctivae and EOM are normal. Pupils are equal, round, and reactive to light.  Neck: Normal range of motion. Neck supple.  Cardiovascular: Normal rate and regular rhythm.   Faint heart sounds  Pulmonary/Chest: Effort normal and breath sounds normal.  Abdominal: Soft. Bowel sounds are normal. He exhibits no distension. There is no tenderness. There is no rebound and no guarding.  Neurological: He is alert.  Skin: Skin is warm. Rash noted.  Sacral erythema without tenderness, or fluctuance.  Nursing note and vitals reviewed.   ED Course   Procedures (including critical care time) Labs Review Labs Reviewed  CBC WITH DIFFERENTIAL - Abnormal; Notable for the following:    RDW 15.8 (*)    All other components within normal limits  COMPREHENSIVE METABOLIC PANEL - Abnormal; Notable for the following:    Glucose, Bld 189 (*)    BUN 24 (*)    Creatinine, Ser 1.52 (*)    Albumin 3.2 (*)    GFR calc non Af Amer 39 (*)    GFR calc Af Amer 46 (*)    All other components within normal limits  URINALYSIS, ROUTINE W REFLEX MICROSCOPIC - Abnormal; Notable for the following:    APPearance TURBID (*)    Hgb urine dipstick MODERATE (*)    Protein, ur 30 (*)    Nitrite POSITIVE (*)    Leukocytes, UA LARGE (*)    All other components within normal limits  PROTIME-INR - Abnormal; Notable for the following:    Prothrombin Time 17.4 (*)    All other components within normal limits  SEDIMENTATION RATE - Abnormal; Notable for the following:    Sed Rate 40 (*)    All other components within normal limits  URINE MICROSCOPIC-ADD ON - Abnormal; Notable for the following:    Bacteria, UA MANY (*)    All other components within normal limits  URINE CULTURE  TROPONIN I  APTT  PRO B NATRIURETIC PEPTIDE  I-STAT CG4 LACTIC ACID, ED    Imaging Review Dg Chest 2 View  12/20/2013   CLINICAL DATA:  Weakness generalized.  UTI.  Fever.  EXAM: CHEST  2 VIEW  COMPARISON:  None.  FINDINGS: Left dual lead cardiac pacemaker. Heart size is upper limits of normal. Lungs are clear without airspace disease or pulmonary edema. Degenerative changes in the thoracic spine. No definite pleural effusions.  IMPRESSION: No acute chest abnormality.   Electronically Signed   By: Markus Daft M.D.   On: 12/20/2013 01:11     EKG Interpretation None      Date: 12/20/2013  Rate: 71  Rhythm: AV dual paced  QRS Axis: normal  Intervals: normal  ST/T Wave abnormalities: normal  Conduction Disutrbances:none  Narrative Interpretation:   Old EKG Reviewed: none  available   MDM   Final diagnoses:  Weakness generalized  UTI (lower urinary tract infection)   Pt comes in with weakness. No recent falls. Normal neuro exam, Generalized weakness, with exam finding showing possible new developing pressure ulcer in the sacrum. DDx: Sepsis syndrome ACS syndrome DKA ICH/Stroke Infection - pneumonia/UTI/Cellulitis Dehydration Electrolyte abnormality Tox syndrome   Based on the exam results and history - appears to have UTI. Pt has no SIRs criteria. Pt's caregiver however did indicate that he is significantly weak, not eating and was sweating - and she was concerned that he was not fit for their services. Will admit as obs, which they agreed.   Nikiesha Milford,  MD 12/20/13 8588

## 2013-12-20 NOTE — Progress Notes (Signed)
Patient admitted to room 1328 from ED with UTI.

## 2013-12-20 NOTE — Progress Notes (Signed)
PT Cancellation Note  Patient Details Name: Randy [name] MRN: [id number] DOB: October 09, 1924   Cancelled Treatment:    Reason Eval/Treat Not Completed: Pain limiting ability to participate, patient states huis knee pain is bad, is quite comfortable in recliner. Discussed with patient he may want to consider  [place] as patient lives alone with daughter in and out. Will return 11/5.   Claretha Cooper 12/20/2013, 4:28 PM

## 2013-12-20 NOTE — Consult Note (Signed)
WOC wound consult note Reason for Consult: Persistent erythema at sacrum and buttocks Wound type:Pressure with incontinence associated dermatitis Pressure Ulcer POA: Yes-Stage 1 Measurement:6cm x 10cm  Wound bed:No open area Drainage (amount, consistency, odor) None Periwound:Intact, moist Dressing procedure/placement/frequency: I will implement a turning and repositioning POC for this patient and suggest moisturizing to follow the skin conditioning cleanser twice daily and PRN.  Additionally, I have provided a pressure redistribution chair cushion for his use when OOB in the chair that he will be able to take with him upon discharge. [place] nursing team will not follow, but will remain available to this patient, the nursing and medical teams.  Please re-consult if needed. Thanks, Maudie Flakes, MSN, RN, [place], [place], [place] [phone])

## 2013-12-20 NOTE — Progress Notes (Addendum)
Progress Note   [name] FMB:[id number] DOB: [birth year] DOA: 12/19/2013 PCP: Pcp Not In System  Dr. Rella Larve in [place].   Brief Narrative:   Randy [name] is an 80 y.o. 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  On initial evaluation in the ED, the patient was noted to have a subtherapeutic INR at 1.41, negative chest x-ray, and urinalysis concerning for infection.  Assessment/Plan:   Principal Problem:   Acute UTI  Continue empiric Rocephin.  Follow-up blood and urine cultures.  Check pre and post void residuals.  Active Problems:   Elevated BNP  Troponins negative.  Chest x-ray clear.  May be reflective of decreased renal clearance given elevated creatinine.    Hyperglycemia  Follow-up hemoglobin A1c.  Start insulin sensitive SSI.    Acute kidney injury  Baseline creatinine unknown.  No improvement despite IV fluids.  Follow-up FeNa.  Check renal ultrasound, rule out post obstructive uropathy.    Decubitus ulcer  Wound care RN consulted.    Hyperlipidemia  Continue Lipitor.    Hypertension  Continue Coreg.    History of MI (myocardial infarction)  Status post stent.  Continue Lipitor, Plavix and Coreg.    DVT  Started on IV heparin (INR subtherapeutic)and Coumadin per pharmacy.    DVT Prophylaxis  Code Status: Full. Family Communication: No family at the bedside.   Disposition Plan: Home when stable.   IV Access:    Peripheral IV   Procedures and diagnostic studies:   Dg Chest 2 View 12/20/2013: No acute chest abnormality.     Medical Consultants:    None.  Anti-Infectives:    Rocephin 12/20/13--->  Subjective:    Randy [name] denies abdominal pain.  No N/V.  Appetite good.  No diarrhea.  Urine stream weak over the past 2 days, has nocturia.     Objective:    Filed Vitals:   12/19/13 2316 12/20/13 0433 12/20/13 0500 12/20/13 0512  BP: 152/68 146/66  126/52  Pulse: 69 69 70 69  Temp: 98.6 F (37 C) 97.7 F (36.5 C)  98.3 F (36.8 C)  TempSrc: Oral Oral  Oral  Resp: 18 22  18   Height: 5\' 7"  (1.702 m)     Weight: 77.111 kg (170 lb)     SpO2: 100% 100%  100%    Intake/Output Summary (Last 24 hours) at 12/20/13 1121 Last data filed at 12/20/13 0530  Gross per 24 hour  Intake    120 ml  Output    300 ml  Net   -180 ml    Exam: Gen:  NAD Cardiovascular:  RRR, No M/R/G Respiratory:  Lungs CTAB Gastrointestinal:  Abdomen soft, NT/ND, + BS Extremities:  Trace edema, venous stasis changes   Data Reviewed:    Labs: Basic Metabolic Panel:  Recent Labs Lab 12/20/13 0128 12/20/13 0725  NA 137 143  K 4.7 4.5  CL 100 105  CO2 22 23  GLUCOSE 189* 158*  BUN 24* 24*  CREATININE 1.52* 1.66*  CALCIUM 9.6 9.3   GFR Estimated Creatinine Clearance: 29.3 mL/min (by C-G formula based on Cr of 1.66). Liver Function Tests:  Recent Labs Lab 12/20/13 0128  AST 20  ALT 16  ALKPHOS 77  BILITOT 0.4  PROT 8.0  ALBUMIN 3.2*   Coagulation profile  Recent Labs Lab 12/20/13 0128 12/20/13 0725  INR 1.41 1.43    CBC:  Recent Labs Lab 12/20/13 0128 12/20/13 0725  WBC 6.8 6.4  NEUTROABS 4.8  --   HGB 13.3 12.7*  HCT 40.2 38.8*  MCV 83.1 84.2  PLT 325 291   Cardiac Enzymes:  Recent Labs Lab 12/20/13 0128  TROPONINI <0.30   BNP (last 3 results)  Recent Labs  12/20/13 0128  PROBNP 3532.0*   CBG:  Recent Labs Lab 12/20/13 0737  GLUCAP 165*   Hgb A1c: No results for input(s): HGBA1C in the last 72 hours.  Urinalysis    Component Value Date/Time   COLORURINE YELLOW 12/20/2013 0159   APPEARANCEUR TURBID* 12/20/2013 0159   LABSPEC 1.018 12/20/2013 0159   PHURINE 5.0 12/20/2013 0159   GLUCOSEU NEGATIVE 12/20/2013 0159   HGBUR MODERATE* 12/20/2013 0159   BILIRUBINUR NEGATIVE 12/20/2013  0159   KETONESUR NEGATIVE 12/20/2013 0159   PROTEINUR 30* 12/20/2013 0159   UROBILINOGEN 0.2 12/20/2013 0159   NITRITE POSITIVE* 12/20/2013 0159   LEUKOCYTESUR LARGE* 12/20/2013 0159    Sepsis Labs:  Recent Labs Lab 12/20/13 0128 12/20/13 0139 12/20/13 0725  WBC 6.8  --  6.4  LATICACIDVEN  --  1.72  --    Microbiology No results found for this or any previous visit (from the past 240 hour(s)).   Medications:   . antiseptic oral rinse  7 mL Mouth Rinse BID  . atorvastatin  10 mg Oral q1800  . busPIRone  15 mg Oral TID  . carvedilol  3.125 mg Oral BID WC  . cefTRIAXone (ROCEPHIN)  IV  1 g Intravenous QHS  . clopidogrel  75 mg Oral Daily  . enoxaparin (LOVENOX) injection  1 mg/kg Subcutaneous Q24H  . famotidine  20 mg Oral Daily  . insulin aspart  0-5 Units Subcutaneous QHS  . insulin aspart  0-9 Units Subcutaneous TID WC  . warfarin  5 mg Oral ONCE-1800  . Warfarin - Pharmacist Dosing Inpatient   Does not apply q1800   Continuous Infusions: . sodium chloride 100 mL/hr at 12/20/13 0500    Time spent: 25 minutes.   LOS: 1 day   Rosalynn Sergent  [facility] Pager [phone]. If unable to reach me by pager, please call my cell phone at [phone].  *Please refer to [email or web address], password [email or web address] to get updated schedule on who will round on this patient, as hospitalists switch teams weekly. If 7PM-7AM, please contact night-coverage at [email or web address], password [email or web address] for any overnight needs.  12/20/2013, 11:21 AM

## 2013-12-20 NOTE — Plan of Care (Signed)
Problem: Phase I Progression Outcomes Goal: Voiding-avoid urinary catheter unless indicated Outcome: Completed/Met Date Met:  12/20/13 Incontinent of urine

## 2013-12-20 NOTE — Evaluation (Signed)
Occupational Therapy Evaluation Patient Details Name: Randy [name] MRN: [id number] DOB: [birth year] Today's Date: 12/20/2013    History of Present Illness Pt was admitted for acute UTI.  He has a h/o HRN, Pacemaker, CAD and MI   Clinical Impression   This 80 year old man was admitted for the above.  PLOF is unknown as is home situation:  Pt is a poor historian.  OT goals will focus on toilet transfers and standing for adls; these goals are set for min A.  Pt currently needs mod A at this time for sit to stand.    Follow Up Recommendations  Supervision/Assistance - 24 hour;SNF (vs)    Equipment Recommendations       Recommendations for Other Services       Precautions / Restrictions Precautions Precautions: Fall Restrictions Weight Bearing Restrictions: No      Mobility Bed Mobility Overal bed mobility: Needs Assistance Bed Mobility: Supine to Sit     Supine to sit: Mod assist     General bed mobility comments: assist for LEs and trunk.  Pt used siderail  Transfers   Equipment used: Rolling walker (2 wheeled) Transfers: Sit to/from Stand Sit to Stand: Mod assist;From elevated surface         General transfer comment: cues for UE placement    Balance                                            ADL Overall ADL's : Needs assistance/impaired     Grooming: Wash/dry hands;Wash/dry face;Sitting;Set up   Upper Body Bathing: Supervision/ safety;Sitting   Lower Body Bathing: Moderate assistance;Sit to/from stand   Upper Body Dressing : Minimal assistance;Sitting   Lower Body Dressing: Maximal assistance;Sit to/from stand                 General ADL Comments: did not get up to chair:  chair alarm box unavailable.  Sidestepped up [place].      Vision                     Perception     Praxis      Pertinent Vitals/Pain Pain Assessment: No/denies pain     Hand Dominance     Extremity/Trunk Assessment Upper Extremity  Assessment Upper Extremity Assessment: Generalized weakness (stiff end ranges)           Communication Communication Communication: No difficulties   Cognition Arousal/Alertness: Awake/alert Behavior During Therapy: WFL for tasks assessed/performed Overall Cognitive Status: No family/caregiver present to determine baseline cognitive functioning (pt oriented to self and type of place only   )                     General Comments       Exercises       Shoulder Instructions      Home Living Family/patient expects to be discharged to:: Private residence Living Arrangements: Children                               Additional Comments: pt is a poor historian.  Chart mentions daughter and caregiver:  unsure of amount of assistance/PLOF      Prior Functioning/Environment          Comments: pt is a poor historian; unsure  OT Diagnosis: Generalized weakness   OT Problem List: Decreased strength;Decreased activity tolerance;Impaired balance (sitting and/or standing);Decreased cognition;Decreased safety awareness;Pain   OT Treatment/Interventions: Self-care/ADL training;DME and/or AE instruction;Patient/family education;Balance training;Cognitive remediation/compensation    OT Goals(Current goals can be found in the care plan section) Acute Rehab OT Goals Patient Stated Goal: none stated OT Goal Formulation: With patient Time For Goal Achievement: 01/03/14 Potential to Achieve Goals: Good ADL Goals Pt Will Transfer to Toilet: with min assist;ambulating;bedside commode Additional ADL Goal #1: pt will go from sit to stand with min A and maintain for 2 minutes with min guard for adls.  OT Frequency: Min 2X/week   Barriers to D/C:            Co-evaluation              End of Session    Activity Tolerance: Patient tolerated treatment well Patient left: in bed;with call bell/phone within reach;with bed alarm set   Time: 5997-7414 OT Time  Calculation (min): 21 min Charges:  OT General Charges $OT Visit: 1 Procedure OT Evaluation $Initial OT Evaluation Tier I: 1 Procedure OT Treatments $Self Care/Home Management : 8-22 mins G-Codes:    Maiko Salais Jan 09, 2014, 12:13 PM   Lesle Chris, OTR/L [phone] 01-09-2014

## 2013-12-20 NOTE — Progress Notes (Addendum)
ANTICOAGULATION CONSULT NOTE - Initial Consult  Pharmacy Consult for Heparin, warfarin Indication: DVT  Not on File  Patient Measurements: Height: 5\' 7"  (170.2 cm) Weight: 170 lb (77.111 kg) IBW/kg (Calculated) : 66.1 Heparin Dosing Weight:   Vital Signs: Temp: 98.3 F (36.8 C) (11/04 0512) Temp Source: Oral (11/04 0512) BP: 126/52 mmHg (11/04 0512) Pulse Rate: 69 (11/04 0512)  Labs:  Recent Labs  12/20/13 0128  HGB 13.3  HCT 40.2  PLT 325  APTT 35  LABPROT 17.4*  INR 1.41  CREATININE 1.52*  TROPONINI <0.30    Estimated Creatinine Clearance: 32 mL/min (by C-G formula based on Cr of 1.52).   Medical History: Past Medical History  Diagnosis Date  . Diabetes mellitus without complication   . Anxiety   . MI (myocardial infarction)   . Hypertension   . Hyperlipidemia     Medications:  Scheduled:  . atorvastatin  10 mg Oral q1800  . busPIRone  15 mg Oral TID  . carvedilol  3.125 mg Oral BID WC  . cefTRIAXone (ROCEPHIN)  IV  1 g Intravenous QHS  . clopidogrel  75 mg Oral Daily  . famotidine  20 mg Oral Daily  . Warfarin - Pharmacist Dosing Inpatient   Does not apply q1800   Infusions:  . sodium chloride    . heparin      Assessment: Patient with hx of DVT and INR <1.5 on admit but on chronic wararin PTA.  Patient is poor historian so a follow up of home meds is to be completed today.    Goal of Therapy:  Heparin level 0.3-0.7 units/ml Monitor platelets by anticoagulation protocol: Yes   Plan:  Heparin drip at 1150 units/hr Daily CBC Next heparin level at  [address], [place] 12/20/2013,6:39 AM  Adrian Saran, PharmD, BCPS Pager [phone] 12/20/2013 8:01 AM

## 2013-12-21 DIAGNOSIS — E119 Type 2 diabetes mellitus without complications: Secondary | ICD-10-CM

## 2013-12-21 DIAGNOSIS — E1122 Type 2 diabetes mellitus with diabetic chronic kidney disease: Secondary | ICD-10-CM

## 2013-12-21 DIAGNOSIS — N189 Chronic kidney disease, unspecified: Secondary | ICD-10-CM

## 2013-12-21 DIAGNOSIS — N179 Acute kidney failure, unspecified: Secondary | ICD-10-CM

## 2013-12-21 LAB — BASIC METABOLIC PANEL
ANION GAP: 12 (ref 5–15)
BUN: 25 mg/dL — ABNORMAL HIGH (ref 6–23)
CALCIUM: 8.7 mg/dL (ref 8.4–10.5)
CO2: 23 mEq/L (ref 19–32)
Chloride: 104 mEq/L (ref 96–112)
Creatinine, Ser: 1.78 mg/dL — ABNORMAL HIGH (ref 0.50–1.35)
GFR, EST AFRICAN AMERICAN: 38 mL/min — AB (ref >90)
GFR, EST NON AFRICAN AMERICAN: 33 mL/min — AB (ref >90)
Glucose, Bld: 146 mg/dL — ABNORMAL HIGH (ref 70–99)
Potassium: 4.1 mEq/L (ref 3.7–5.3)
SODIUM: 139 meq/L (ref 137–147)

## 2013-12-21 LAB — GLUCOSE, CAPILLARY
GLUCOSE-CAPILLARY: 265 mg/dL — AB (ref 70–99)
GLUCOSE-CAPILLARY: 168 mg/dL — AB (ref 70–99)
GLUCOSE-CAPILLARY: 171 mg/dL — AB (ref 70–99)
Glucose-Capillary: 164 mg/dL — ABNORMAL HIGH (ref 70–99)
Glucose-Capillary: 194 mg/dL — ABNORMAL HIGH (ref 70–99)

## 2013-12-21 LAB — CBC
HCT: 36.1 % — ABNORMAL LOW (ref 39.0–52.0)
Hemoglobin: 11.6 g/dL — ABNORMAL LOW (ref 13.0–17.0)
MCH: 27.3 pg (ref 26.0–34.0)
MCHC: 32.1 g/dL (ref 30.0–36.0)
MCV: 84.9 fL (ref 78.0–100.0)
Platelets: 298 10*3/uL (ref 150–400)
RBC: 4.25 MIL/uL (ref 4.22–5.81)
RDW: 15.9 % — AB (ref 11.5–15.5)
WBC: 6.6 10*3/uL (ref 4.0–10.5)

## 2013-12-21 LAB — PROTIME-INR
INR: 1.42 (ref 0.00–1.49)
Prothrombin Time: 17.5 seconds — ABNORMAL HIGH (ref 11.6–15.2)

## 2013-12-21 MED ORDER — INSULIN NPH (HUMAN) (ISOPHANE) 100 UNIT/ML ~~LOC~~ SUSP
14.0000 [IU] | Freq: Two times a day (BID) | SUBCUTANEOUS | Status: DC
  Administered 2013-12-21 – 2013-12-23 (×4): 14 [IU] via SUBCUTANEOUS
  Filled 2013-12-21: qty 10

## 2013-12-21 MED ORDER — WARFARIN SODIUM 5 MG PO TABS
5.0000 mg | ORAL_TABLET | Freq: Once | ORAL | Status: AC
  Administered 2013-12-21: 5 mg via ORAL
  Filled 2013-12-21: qty 1

## 2013-12-21 MED ORDER — TAMSULOSIN HCL 0.4 MG PO CAPS
0.4000 mg | ORAL_CAPSULE | Freq: Every day | ORAL | Status: DC
  Administered 2013-12-21 – 2013-12-23 (×3): 0.4 mg via ORAL
  Filled 2013-12-21 (×3): qty 1

## 2013-12-21 NOTE — Care Management (Signed)
CARE MANAGEMENT NOTE 12/21/2013  Patient:  Randy [name], Randy [name]   Account Number:  [id number]  Date Initiated:  12/21/2013  Documentation initiated by:  Marney Doctor  Subjective/Objective Assessment:   80 yo admitted with UTI. history of hypertension, hyperlipidemia, history of right leg DVT on coumadin, pacemaker placement, CAD and myocardial infarction, who presents with suprapubic abdominal pain.     Action/Plan:   Lives with daughter who has MS and round the clock caregivers.   Anticipated DC Date:  12/24/2013   Anticipated DC Plan:  [place]  CM consult      [facility] Choice  HOME HEALTH   Choice offered to / List presented to:  C-4 Adult Children   DME arranged  [place] BED      DME agency  [place] arranged  HH-1 RN  [place].   Status of service:  In process, will continue to follow Medicare Important Message given?   (If response is "NO", the following Medicare IM given date fields will be blank) Date Medicare IM given:   Medicare IM given by:   Date Additional Medicare IM given:   Additional Medicare IM given by:    Discharge Disposition:    Per UR Regulation:  Reviewed for med. necessity/level of care/duration of stay  If discussed at [place] of Stay Meetings, dates discussed:    Comments:  12/21/13 Marney Doctor RN,BSN,NCM [phone] Spoke with pt about DC plan.  PT is recommending SNF.  Pt states that he will not be going to SNF, he will be going back home with his daughter.  I called and spoke with his daughter just to verify the plan.  Daughter [name] [phone] states that her plan is for her father to come back home with her as well.  She has MS and has 24hr caregivers.  Pt has been living with daugher for about 3 months and caregivers have been taking care of him as well. The address where they are living is [place] [address] [place] [address].  Daugher was offered choice for [facility] services and would like to use [facility].  She also states that pt will need a hospital bed and wheelchair.  MD paged to ask for orders for DME and HH.  Will call DME rep when orders placed.  [facility] HH rep informed of [place] referral and will follow pt progress. CM will continue to follow and assist with DC needs.

## 2013-12-21 NOTE — Plan of Care (Signed)
Problem: Phase II Progression Outcomes Goal: Voiding independently Outcome: Completed/Met Date Met:  12/21/13

## 2013-12-21 NOTE — Plan of Care (Signed)
Problem: Phase I Progression Outcomes Goal: Vital Signs stable- temperature less than 102 Outcome: Completed/Met Date Met:  12/21/13

## 2013-12-21 NOTE — Plan of Care (Signed)
Problem: Phase I Progression Outcomes Goal: OOB as tolerated unless otherwise ordered Outcome: Progressing Worked with PT/OT on 11/4

## 2013-12-21 NOTE — Plan of Care (Signed)
Problem: Phase I Progression Outcomes Goal: Hemodynamically stable Outcome: Completed/Met Date Met:  12/21/13     

## 2013-12-21 NOTE — Plan of Care (Signed)
Problem: Phase I Progression Outcomes Goal: Pain controlled with appropriate interventions Outcome: Not Applicable Date Met:  28/76/81 Denies pain

## 2013-12-21 NOTE — Progress Notes (Addendum)
Clinical Social Work Department BRIEF PSYCHOSOCIAL ASSESSMENT 12/22/2013  Patient:  Randy [name], Randy [name]     Account Number:  [id number]     Admit date:  12/19/2013  Clinical Social Worker:  Ulyess Blossom  Date/Time:  12/21/2013 03:00 PM  Referred by:  Physician  Date Referred:  12/21/2013 Referred for  SNF Placement   Other Referral:   Interview type:  Patient Other interview type:    PSYCHOSOCIAL DATA Living Status:  FAMILY Admitted from facility:   Level of care:   Primary support name:  Randy [name]/[phone] Primary support relationship to patient:  CHILD, ADULT Degree of support available:   strong    CURRENT CONCERNS Current Concerns  Post-Acute Placement   Other Concerns:    SOCIAL WORK ASSESSMENT / PLAN CSW received referral.    CSW and RNCM met with pt at bedside. CSW introduced self and explained role. CSW and RNCM discussed recommendation for ST SNF for discharge. Pt declines SNF and states that he plans to return home with pt daughter and 24 hour caregivers. Pt agreeable for RNCM to contact pt daughter to confirm that pt can discharge to pt daughter with 24 hour care.    RNCM contacted pt daughter via telephone and confirmed that pt daughter can manage pt care needs at home with the assistance of 24 hour care givers.    Per RN, pt will need ambulance transport home upon discharge. CSW to assist.    CSW to continue to follow.   Assessment/plan status:  Psychosocial Support/Ongoing Assessment of Needs Other assessment/ plan:   Information/referral to community resources:   RNCM following for [facility] needs. Pt declines SNF.    PATIENT'S/FAMILY'S RESPONSE TO PLAN OF CARE: Pt alert and oriented x 3. Pt declining SNF and plans to return home with pt daughter and 24 hour caregivers and home health. Pt daughter confirmed plan.    Alison Murray, MSW, [place] [facility] [phone]

## 2013-12-21 NOTE — Evaluation (Signed)
Physical Therapy Evaluation Patient Details Name: Randy [name] MRN: [id number] DOB: [birth year] Today's Date: 12/21/2013   History of Present Illness  Pt was admitted for acute UTI.  He has a h/o HRN, Pacemaker, CAD and MI  Clinical Impression  Pt was seen for initiating mobility after not being appropriate to evaluate today.  He wants to go straight home but PT recommends he go to SNF as his energy and pain-related disability are substantial and not going to change quickly.  Pt has ramped entrance to home and struggling with gait on level surface    Follow Up Recommendations SNF    Equipment Recommendations  None recommended by PT    Recommendations for Other Services       Precautions / Restrictions Precautions Precautions: Fall Restrictions Weight Bearing Restrictions: No      Mobility  Bed Mobility Overal bed mobility: Needs Assistance Bed Mobility: Supine to Sit     Supine to sit: Mod assist     General bed mobility comments: assist for LEs and trunk.  Pt used siderail  Transfers Overall transfer level: Needs assistance Equipment used: Rolling walker (2 wheeled);1 person hand held assist (one person standing by) Transfers: Sit to/from Stand Sit to Stand: Mod assist;From elevated surface         General transfer comment: cues for UE placement  Ambulation/Gait Ambulation/Gait assistance: +2 safety/equipment;Mod assist Ambulation Distance (Feet): 20 Feet Assistive device: Rolling walker (2 wheeled) Gait Pattern/deviations: Decreased step length - right;Decreased step length - left;Step-through pattern;Decreased dorsiflexion - right;Decreased dorsiflexion - left;Wide base of support;Trunk flexed Gait velocity: slow Gait velocity interpretation: Below normal speed for age/gender General Gait Details: Has shuffled appearance but mainly due to dependence on LLE as R knee is in tight position and ER'd hip, limited DF and knee ext  Stairs             Wheelchair Mobility    Modified Rankin (Stroke Patients Only)       Balance Overall balance assessment: Needs assistance Sitting-balance support: Feet supported;Bilateral upper extremity supported Sitting balance-Leahy Scale: Fair Sitting balance - Comments: Reminded to protect his IV site Postural control: Posterior lean Standing balance support: Bilateral upper extremity supported Standing balance-Leahy Scale: Poor Standing balance comment: walker and vc's with tc's to correct standing foot placement and control wgt shift onto RLE                             Pertinent Vitals/Pain Pain Assessment: 0-10 Pain Score: 6  Pain Location: R knee Pain Intervention(s): Patient requesting pain meds-RN notified;Monitored during session;Limited activity within patient's tolerance    Home Living Family/patient expects to be discharged to:: Private residence Living Arrangements: Children Available Help at Discharge: Family;Personal care attendant;Available PRN/intermittently Type of Home: House Home Access: Ramped entrance     Home Layout: One level Home Equipment: Walker - 2 wheels;Shower seat;Grab bars - tub/shower Additional Comments: Not sure of the assistance available at home    Prior Function Level of Independence: Independent with assistive device(s) (Help at home caring for home from caretakers)               Hand Dominance        Extremity/Trunk Assessment   Upper Extremity Assessment: Overall WFL for tasks assessed           Lower Extremity Assessment: Generalized weakness      Cervical / Trunk Assessment: Normal  Communication   Communication: No  difficulties  Cognition Arousal/Alertness: Awake/alert Behavior During Therapy: WFL for tasks assessed/performed Overall Cognitive Status: No family/caregiver present to determine baseline cognitive functioning                      General Comments General comments (skin integrity,  edema, etc.): Pt is edematous and complaining of R toenail being ingrown.  Has appearance that he will need a  great deal of care to go home from hospital.  Recommended SNF to doctor and nursing, as well as pt    Exercises General Exercises - Lower Extremity Ankle Circles/Pumps: AROM;Both;10 reps Quad Sets: AROM;Both;10 reps Gluteal Sets: AROM;Both;10 reps Long Arc Quad: Strengthening;Both;10 reps Heel Slides: Strengthening;Both;10 reps Hip ABduction/ADduction: Strengthening;Both;10 reps      Assessment/Plan    PT Assessment Patient needs continued PT services  PT Diagnosis Difficulty walking   PT Problem List Decreased strength;Decreased range of motion;Decreased activity tolerance;Decreased balance;Decreased mobility;Decreased coordination;Decreased cognition;Decreased knowledge of use of DME;Decreased safety awareness;Decreased knowledge of precautions;Obesity;Decreased skin integrity;Pain  PT Treatment Interventions DME instruction;Gait training;Functional mobility training;Therapeutic activities;Therapeutic exercise;Balance training;Neuromuscular re-education;Cognitive remediation;Patient/family education   PT Goals (Current goals can be found in the Care Plan section) Acute Rehab PT Goals Patient Stated Goal: none stated PT Goal Formulation: With patient Potential to Achieve Goals: Fair    Frequency Min 3X/week   Barriers to discharge Inaccessible home environment Pt reports his son in law will lift him where he cannot help    Co-evaluation               End of Session Equipment Utilized During Treatment: Other (comment) (FWW) Activity Tolerance: Patient limited by pain Patient left: in chair;with call bell/phone within reach Nurse Communication: Mobility status         Time: 9381-8299 PT Time Calculation (min): 31 min   Charges:   PT Evaluation $Initial PT Evaluation Tier I: 1 Procedure PT Treatments $Gait Training: 8-22 mins   PT G CodesRamond Dial 12/21/2013, 11:50 AM   Mee Hives, PT MS Acute Rehab Dept. Number: [phone]

## 2013-12-21 NOTE — Plan of Care (Signed)
Problem: Phase I Progression Outcomes Goal: Tolerating diet Outcome: Completed/Met Date Met:  12/21/13

## 2013-12-21 NOTE — Progress Notes (Signed)
[place] for Lovenox, warfarin Indication: history of DVT  No Known Allergies  Patient Measurements: Height: 5\' 7"  (170.2 cm) Weight: 170 lb (77.111 kg) IBW/kg (Calculated) : 66.1  Vital Signs: Temp: 98.2 F (36.8 C) (11/05 0511) Temp Source: Oral (11/05 0511) BP: 134/51 mmHg (11/05 0511) Pulse Rate: 75 (11/05 0511)  Labs:  Recent Labs  12/20/13 0128 12/20/13 0725 12/21/13 0413  HGB 13.3 12.7* 11.6*  HCT 40.2 38.8* 36.1*  PLT 325 291 298  APTT 35  --   --   LABPROT 17.4* 17.6* 17.5*  INR 1.41 1.43 1.42  CREATININE 1.52* 1.66* 1.78*  TROPONINI <0.30  --   --     Estimated Creatinine Clearance: 27.3 mL/min (by C-G formula based on Cr of 1.78).   Medical History: Past Medical History  Diagnosis Date  . Diabetes mellitus without complication   . Anxiety   . MI (myocardial infarction)   . Hypertension   . Hyperlipidemia     Medications:  Scheduled:  . antiseptic oral rinse  7 mL Mouth Rinse BID  . atorvastatin  10 mg Oral q1800  . busPIRone  15 mg Oral TID  . carvedilol  3.125 mg Oral BID WC  . cefTRIAXone (ROCEPHIN)  IV  1 g Intravenous QHS  . clopidogrel  75 mg Oral Daily  . enoxaparin (LOVENOX) injection  1 mg/kg Subcutaneous Q24H  . famotidine  20 mg Oral Daily  . feeding supplement (ENSURE COMPLETE)  237 mL Oral BID BM  . insulin aspart  0-5 Units Subcutaneous QHS  . insulin aspart  0-9 Units Subcutaneous TID WC  . Warfarin - Pharmacist Dosing Inpatient   Does not apply q1800   Infusions:  . sodium chloride 1,000 mL (12/21/13 3875)    Assessment: 80 yo male presented to ER early this AM with lower abd pain. Per notes, caregiver states that he is not walking and eating well as usual. Also has chronic right leg edema and tenderness in lower leg. Patient found to have UTI started on antibiotics. Note that patient was on warfarin prior to admission and INR was subtherapeutic on admission. Started on IV heparin and  now transitioning to full dose Lovenox until INR therapeutic   Warfarin dose PTA: 5mg  daily  INR 1.42 today (SUBtherapeutic)  CBC ok  No reported bleeding  Patient also with positive UA on Rocephin  Warfarin doses: 11/4: 5mg   Goal of Therapy:  INR 2-3 Anti-Xa level 0.6-1 units/ml 4hrs after LMWH dose given Monitor platelets by anticoagulation protocol: Yes   Plan:  1) Continue Lovenox 1mg /kg (75mg ) q24 3) Monitor renal function for any possible Lovenox dosage changes 4) D/C Lovenox when INR therapeutic x 24 hours 5) Warfarin 5mg  tonight 6) Daily INR  Kizzie Furnish, PharmD Pager: [phone] 12/21/2013 9:45 AM

## 2013-12-21 NOTE — Plan of Care (Signed)
Problem: Acute Rehab PT Goals(only PT should resolve) Goal: Pt Will Go Supine/Side To Sit Using no bedrails Goal: Pt Will Transfer Bed To Chair/Chair To Bed With LRAD

## 2013-12-21 NOTE — Plan of Care (Signed)
Problem: Consults Goal: Skin Care Protocol Initiated - if Braden Score 18 or less If consults are not indicated, leave blank or document N/A  Outcome: Completed/Met Date Met:  12/21/13     

## 2013-12-21 NOTE — Progress Notes (Signed)
Progress Note   Randy [name] [id number] DOB: May 16, 1924 DOA: 12/19/2013 PCP: Pcp Not In System  Dr. Rella Larve in [place].   Brief Narrative:   Randy [name] is an 80 y.o. 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  On initial evaluation in the ED, the patient was noted to have a subtherapeutic INR at 1.41, negative chest x-ray, and urinalysis concerning for infection.  Assessment/Plan:   Principal Problem:   Acute UTI  Continue empiric Rocephin.  Follow-up blood and urine cultures.  Had 107 cc post-void residual.  Start flomax.  Active Problems:   Elevated BNP  Troponins negative.  Chest x-ray clear.  May be reflective of decreased renal clearance given elevated creatinine.    Hyperglycemia / type II DM  Hemoglobin A1c is 7.7%.  CBGs 165-265.  Resume home dose of NPH.  Continue insulin sensitive SSI.    Acute kidney injury  Baseline creatinine unknown.  No improvement despite IV fluids, suspect that he has CKD.  No hydronephrosis on renal U/S.  Follow-up FeNa.    Decubitus ulcer  Wound care RN consulted.    Hyperlipidemia  Continue Lipitor.    Hypertension  Continue Coreg.    History of MI (myocardial infarction)  Status post stent.  Continue Lipitor, Plavix and Coreg.    DVT  On bridging Lovenox (INR subtherapeutic)and Coumadin per pharmacy.  INR not yet therapeutic.  Code Status: Full. Family Communication: No family at the bedside.  Caregiver, Luanna Cole updated by telephone.   Disposition Plan: Home when stable.  Has 2 caregivers at all times.   IV Access:    Peripheral IV   Procedures and diagnostic studies:   Dg Chest 2 View 12/20/2013: No acute chest abnormality.     US Renal 12/20/2013: 1. Large right upper pole cyst, 10.8 cm. 2. No hydronephrosis. 3. Significant  postvoid residual calculated to be 107 cc. 4. Debris within the bladder.    Medical Consultants:    None.  Anti-Infectives:    Rocephin 12/20/13--->  Subjective:   Randy [name] is without complaints.  No abdominal pain, nausea or vomiting.    Objective:    Filed Vitals:   12/20/13 1440 12/20/13 2045 12/20/13 2224 12/21/13 0511  BP: 144/61  131/60 134/51  Pulse: 73 80 69 75  Temp: 97.7 F (36.5 C)  98.7 F (37.1 C) 98.2 F (36.8 C)  TempSrc: Oral  Oral Oral  Resp: 18  16 16   Height:      Weight:      SpO2: 100%  99% 99%    Intake/Output Summary (Last 24 hours) at 12/21/13 1047 Last data filed at 12/21/13 0655  Gross per 24 hour  Intake 3461.66 ml  Output   1000 ml  Net 2461.66 ml    Exam: Gen:  NAD Cardiovascular:  RRR, No M/R/G Respiratory:  Lungs CTAB Gastrointestinal:  Abdomen soft, NT/ND, + BS Extremities:  Trace edema, venous stasis changes   Data Reviewed:    Labs: Basic Metabolic Panel:  Recent Labs Lab 12/20/13 0128 12/20/13 0725 12/21/13 0413  NA 137 143 139  K 4.7 4.5 4.1  CL 100 105 104  CO2 22 23 23   GLUCOSE 189* 158* 146*  BUN 24* 24* 25*  CREATININE 1.52* 1.66* 1.78*  CALCIUM 9.6 9.3 8.7   GFR Estimated Creatinine Clearance: 27.3 mL/min (by C-G formula  based on Cr of 1.78). Liver Function Tests:  Recent Labs Lab 12/20/13 0128  AST 20  ALT 16  ALKPHOS 77  BILITOT 0.4  PROT 8.0  ALBUMIN 3.2*   Coagulation profile  Recent Labs Lab 12/20/13 0128 12/20/13 0725 12/21/13 0413  INR 1.41 1.43 1.42    CBC:  Recent Labs Lab 12/20/13 0128 12/20/13 0725 12/21/13 0413  WBC 6.8 6.4 6.6  NEUTROABS 4.8  --   --   HGB 13.3 12.7* 11.6*  HCT 40.2 38.8* 36.1*  MCV 83.1 84.2 84.9  PLT 325 291 298   Cardiac Enzymes:  Recent Labs Lab 12/20/13 0128  TROPONINI <0.30   BNP (last 3 results)  Recent Labs  12/20/13 0128  PROBNP 3532.0*   CBG:  Recent Labs Lab 12/20/13 0737 12/20/13 1237 12/20/13 1653  12/20/13 2220 12/21/13 0725  GLUCAP 165* 191* 168* 265* 168*   Hgb A1c:  Recent Labs  12/20/13 0725  HGBA1C 7.7*    Urinalysis    Component Value Date/Time   COLORURINE YELLOW 12/20/2013 0159   APPEARANCEUR TURBID* 12/20/2013 0159   LABSPEC 1.018 12/20/2013 0159   PHURINE 5.0 12/20/2013 0159   GLUCOSEU NEGATIVE 12/20/2013 0159   HGBUR MODERATE* 12/20/2013 0159   BILIRUBINUR NEGATIVE 12/20/2013 0159   KETONESUR NEGATIVE 12/20/2013 0159   PROTEINUR 30* 12/20/2013 0159   UROBILINOGEN 0.2 12/20/2013 0159   NITRITE POSITIVE* 12/20/2013 0159   LEUKOCYTESUR LARGE* 12/20/2013 0159    Sepsis Labs:  Recent Labs Lab 12/20/13 0128 12/20/13 0139 12/20/13 0725 12/21/13 0413  WBC 6.8  --  6.4 6.6  LATICACIDVEN  --  1.72  --   --    Microbiology Recent Results (from the past 240 hour(s))  Culture, blood (routine x 2)     Status: None (Preliminary result)   Collection Time: 12/20/13  7:15 AM  Result Value Ref Range Status   Specimen Description BLOOD LEFT ARM  Final   Special Requests BOTTLES DRAWN AEROBIC ONLY 5CC  Final   Culture  Setup Time   Final    12/20/2013 10:12 Performed at [facility]    Culture   Final           BLOOD CULTURE RECEIVED NO GROWTH TO DATE CULTURE WILL BE HELD FOR 5 DAYS BEFORE ISSUING A FINAL NEGATIVE REPORT Performed at [facility]    Report Status PENDING  Incomplete  Culture, blood (routine x 2)     Status: None (Preliminary result)   Collection Time: 12/20/13  7:20 AM  Result Value Ref Range Status   Specimen Description BLOOD LEFT ANTECUBITAL  Final   Special Requests BOTTLES DRAWN AEROBIC ONLY 5CC  Final   Culture  Setup Time   Final    12/20/2013 10:13 Performed at [facility]    Culture   Final           BLOOD CULTURE RECEIVED NO GROWTH TO DATE CULTURE WILL BE HELD FOR 5 DAYS BEFORE ISSUING A FINAL NEGATIVE REPORT Performed at [facility]    Report Status PENDING  Incomplete     Medications:    . antiseptic oral rinse  7 mL Mouth Rinse BID  . atorvastatin  10 mg Oral q1800  . busPIRone  15 mg Oral TID  . carvedilol  3.125 mg Oral BID WC  . cefTRIAXone (ROCEPHIN)  IV  1 g Intravenous QHS  . clopidogrel  75 mg Oral Daily  . enoxaparin (LOVENOX) injection  1 mg/kg Subcutaneous Q24H  .  famotidine  20 mg Oral Daily  . feeding supplement (ENSURE COMPLETE)  237 mL Oral BID BM  . insulin aspart  0-5 Units Subcutaneous QHS  . insulin aspart  0-9 Units Subcutaneous TID WC  . warfarin  5 mg Oral ONCE-1800  . Warfarin - Pharmacist Dosing Inpatient   Does not apply q1800   Continuous Infusions: . sodium chloride 1,000 mL (12/21/13 0655)    Time spent: 25 minutes.   LOS: 2 days   Jalene Demo  [facility] Pager [phone]. If unable to reach me by pager, please call my cell phone at [phone].  *Please refer to [email or web address], password [email or web address] to get updated schedule on who will round on this patient, as hospitalists switch teams weekly. If 7PM-7AM, please contact night-coverage at [email or web address], password [email or web address] for any overnight needs.  12/21/2013, 10:47 AM

## 2013-12-22 DIAGNOSIS — L8990 Pressure ulcer of unspecified site, unspecified stage: Secondary | ICD-10-CM

## 2013-12-22 DIAGNOSIS — Z86718 Personal history of other venous thrombosis and embolism: Secondary | ICD-10-CM

## 2013-12-22 LAB — BASIC METABOLIC PANEL
ANION GAP: 14 (ref 5–15)
BUN: 22 mg/dL (ref 6–23)
CALCIUM: 8.6 mg/dL (ref 8.4–10.5)
CO2: 21 mEq/L (ref 19–32)
CREATININE: 1.46 mg/dL — AB (ref 0.50–1.35)
Chloride: 104 mEq/L (ref 96–112)
GFR, EST AFRICAN AMERICAN: 48 mL/min — AB (ref >90)
GFR, EST NON AFRICAN AMERICAN: 41 mL/min — AB (ref >90)
Glucose, Bld: 142 mg/dL — ABNORMAL HIGH (ref 70–99)
Potassium: 3.8 mEq/L (ref 3.7–5.3)
Sodium: 139 mEq/L (ref 137–147)

## 2013-12-22 LAB — PROTIME-INR
INR: 1.5 — ABNORMAL HIGH (ref 0.00–1.49)
Prothrombin Time: 18.2 seconds — ABNORMAL HIGH (ref 11.6–15.2)

## 2013-12-22 LAB — URINE CULTURE

## 2013-12-22 LAB — CBC
HEMATOCRIT: 33.8 % — AB (ref 39.0–52.0)
Hemoglobin: 10.9 g/dL — ABNORMAL LOW (ref 13.0–17.0)
MCH: 26.9 pg (ref 26.0–34.0)
MCHC: 32.2 g/dL (ref 30.0–36.0)
MCV: 83.5 fL (ref 78.0–100.0)
PLATELETS: 281 10*3/uL (ref 150–400)
RBC: 4.05 MIL/uL — ABNORMAL LOW (ref 4.22–5.81)
RDW: 15.7 % — ABNORMAL HIGH (ref 11.5–15.5)
WBC: 5.9 10*3/uL (ref 4.0–10.5)

## 2013-12-22 LAB — GLUCOSE, CAPILLARY
Glucose-Capillary: 139 mg/dL — ABNORMAL HIGH (ref 70–99)
Glucose-Capillary: 159 mg/dL — ABNORMAL HIGH (ref 70–99)
Glucose-Capillary: 143 mg/dL — ABNORMAL HIGH (ref 70–99)
Glucose-Capillary: 118 mg/dL — ABNORMAL HIGH (ref 70–99)

## 2013-12-22 MED ORDER — CETYLPYRIDINIUM CHLORIDE 0.05 % MT LIQD: 7.0000 mL | Freq: Two times a day (BID) | OROMUCOSAL | Status: DC

## 2013-12-22 MED ORDER — ENOXAPARIN SODIUM 80 MG/0.8ML ~~LOC~~ SOLN: 1.0000 mg/kg | Freq: Two times a day (BID) | SUBCUTANEOUS | Status: DC

## 2013-12-22 MED ORDER — VANCOMYCIN HCL 1000 MG IV SOLR
750.0000 mg | INTRAVENOUS | Status: DC
  Administered 2013-12-22 – 2013-12-23 (×2): 750 mg via INTRAVENOUS
  Filled 2013-12-22 (×3): qty 750

## 2013-12-22 MED ORDER — ENOXAPARIN SODIUM 80 MG/0.8ML ~~LOC~~ SOLN
1.0000 mg/kg | Freq: Two times a day (BID) | SUBCUTANEOUS | Status: DC
  Administered 2013-12-22 – 2013-12-23 (×3): 75 mg via SUBCUTANEOUS
  Filled 2013-12-22 (×5): qty 0.8

## 2013-12-22 MED ORDER — TAMSULOSIN HCL 0.4 MG PO CAPS: 0.4000 mg | ORAL_CAPSULE | Freq: Every day | ORAL | Status: DC

## 2013-12-22 MED ORDER — ACETAMINOPHEN-CODEINE #3 300-30 MG PO TABS: 1.0000 | ORAL_TABLET | Freq: Two times a day (BID) | ORAL | Status: DC | PRN

## 2013-12-22 MED ORDER — WARFARIN SODIUM 7.5 MG PO TABS
7.5000 mg | ORAL_TABLET | Freq: Once | ORAL | Status: AC
  Administered 2013-12-22: 7.5 mg via ORAL
  Filled 2013-12-22: qty 1

## 2013-12-22 MED ORDER — WARFARIN SODIUM 7.5 MG PO TABS: 7.5000 mg | ORAL_TABLET | Freq: Once | ORAL | Status: DC

## 2013-12-22 MED ORDER — ENSURE COMPLETE PO LIQD: 237.0000 mL | Freq: Two times a day (BID) | ORAL | Status: DC

## 2013-12-22 MED ORDER — BUSPIRONE HCL 15 MG PO TABS: 15.0000 mg | ORAL_TABLET | Freq: Two times a day (BID) | ORAL | Status: DC

## 2013-12-22 NOTE — Plan of Care (Signed)
Problem: Phase I Progression Outcomes Goal: Adequate I & O Outcome: Completed/Met Date Met:  12/22/13

## 2013-12-22 NOTE — Care Management Note (Signed)
CARE MANAGEMENT NOTE 12/22/2013  Patient:  Randy [name], Randy [name]   Account Number:  [id number]  Date Initiated:  12/21/2013  Documentation initiated by:  Marney Doctor  Subjective/Objective Assessment:   80 yo admitted with UTI. history of hypertension, hyperlipidemia, history of right leg DVT on coumadin, pacemaker placement, CAD and myocardial infarction, who presents with suprapubic abdominal pain.     Action/Plan:   Lives with daughter who has MS and round the clock caregivers.   Anticipated DC Date:  12/24/2013   Anticipated DC Plan:  [place]  CM consult      [facility] Choice  HOME HEALTH   Choice offered to / List presented to:  C-4 Adult Children   DME arranged  [facility]      DME agency  [place] arranged  HH-1 RN  [place].   Status of service:  In process, will continue to follow Medicare Important Message given?  YES (If response is "NO", the following Medicare IM given date fields will be blank) Date Medicare IM given:  12/22/2013 Medicare IM given by:  Marney Doctor Date Additional Medicare IM given:   Additional Medicare IM given by:    Discharge Disposition:    Per UR Regulation:  Reviewed for med. necessity/level of care/duration of stay  If discussed at [place] of Stay Meetings, dates discussed:    Comments:  12/22/13 Marney Doctor RN,BSN,NCM Orders written for HHRN/PT/OT/Aide, wheelchair and hospital bed.  Pts daughter states that they do not need an aide since they currently have round the clock caregivers.  [facility] DME rep Pura Spice called for Hospital bed and wheelchair. Per Pura Spice pt already has a wheelchair ordered under his name from [facility], and would have to pay out of pocket for a second one.  Spoke with caregiver Luanna Cole about wheelchair and she states she will just have the one they have fixed and do  not want another one.  No other Cm needs noted at this time.  CM will continue to follow.  12/21/13 Marney Doctor RN,BSN,NCM [phone] Spoke with pt about DC plan.  PT is recommending SNF.  Pt states that he will not be going to SNF, he will be going back home with his daughter.  I called and spoke with his daughter just to verify the plan.  Daughter Randy [name] [phone] states that her plan is for her father to come back home with her as well.  She has MS and has 24hr caregivers.  Pt has been living with daugher for about 3 months and caregivers have been taking care of him as well. The address where they are living is [address], [place] [address].  Daugher was offered choice for [facility] services and would like to use [facility].  She also states that pt will need a hospital bed and wheelchair.  MD paged to ask for orders for DME and HH.  Will call DME rep when orders placed.  [facility] HH rep informed of Industry referral and will follow pt progress. CM will continue to follow and assist with DC needs.

## 2013-12-22 NOTE — Progress Notes (Signed)
[facility] is providing the following services: Wheelchair and Hospital bed  If patient discharges after hours, please call [phone].   Linward Headland 12/22/2013, 10:05 AM

## 2013-12-22 NOTE — Progress Notes (Signed)
ANTIBIOTIC CONSULT NOTE - INITIAL  Pharmacy Consult for Vancomycin Indication: Bacteremia  No Known Allergies  Patient Measurements: Height: 5\' 7"  (170.2 cm) Weight: 170 lb (77.111 kg) IBW/kg (Calculated) : 66.1   Vital Signs: Temp: 98 F (36.7 C) (11/05 2059) Temp Source: Oral (11/05 2059) BP: 143/66 mmHg (11/05 2059) Pulse Rate: 81 (11/05 2059) Intake/Output from previous day: 11/05 0701 - 11/06 0700 In: 720 [P.O.:720] Out: 1550 [Urine:1550] Intake/Output from this shift: Total I/O In: -  Out: 450 [Urine:450]  Labs:  Recent Labs  12/20/13 0128 12/20/13 0159 12/20/13 0725 12/21/13 0413  WBC 6.8  --  6.4 6.6  HGB 13.3  --  12.7* 11.6*  PLT 325  --  291 298  LABCREA  --  145.7  --   --   CREATININE 1.52*  --  1.66* 1.78*   Estimated Creatinine Clearance: 27.3 mL/min (by C-G formula based on Cr of 1.78). No results for input(s): VANCOTROUGH, VANCOPEAK, VANCORANDOM, GENTTROUGH, GENTPEAK, GENTRANDOM, TOBRATROUGH, TOBRAPEAK, TOBRARND, AMIKACINPEAK, AMIKACINTROU, AMIKACIN in the last 72 hours.   Microbiology: Recent Results (from the past 720 hour(s))  Urine culture     Status: None (Preliminary result)   Collection Time: 12/20/13  1:59 AM  Result Value Ref Range Status   Specimen Description URINE, RANDOM  Final   Special Requests NONE  Final   Culture  Setup Time   Final    12/20/2013 08:38 Performed at [facility]   Final    >=100,000 COLONIES/ML Performed at [facility]    Culture   Final    ESCHERICHIA COLI Performed at [facility]    Report Status PENDING  Incomplete  Culture, blood (routine x 2)     Status: None (Preliminary result)   Collection Time: 12/20/13  7:15 AM  Result Value Ref Range Status   Specimen Description BLOOD LEFT ARM  Final   Special Requests BOTTLES DRAWN AEROBIC ONLY 5CC  Final   Culture  Setup Time   Final    12/20/2013 10:12 Performed at [facility]    Culture   Final            BLOOD CULTURE RECEIVED NO GROWTH TO DATE CULTURE WILL BE HELD FOR 5 DAYS BEFORE ISSUING A FINAL NEGATIVE REPORT Performed at [facility]    Report Status PENDING  Incomplete  Culture, blood (routine x 2)     Status: None (Preliminary result)   Collection Time: 12/20/13  7:20 AM  Result Value Ref Range Status   Specimen Description BLOOD LEFT ANTECUBITAL  Final   Special Requests BOTTLES DRAWN AEROBIC ONLY 5CC  Final   Culture  Setup Time   Final    12/20/2013 10:13 Performed at [facility]    Culture   Final    [place] IN CLUSTERS Note: Gram Stain Report Called to,Read Back By and Verified With: JENNIFER DODSON ON 12/21/2013 AT 11:55P BY WILEJ Performed at [facility]    Report Status PENDING  Incomplete    Medical History: Past Medical History  Diagnosis Date  . Diabetes mellitus without complication   . Anxiety   . MI (myocardial infarction)   . Hypertension   . Hyperlipidemia     Medications:  Scheduled:  . antiseptic oral rinse  7 mL Mouth Rinse BID  . atorvastatin  10 mg Oral q1800  . busPIRone  15 mg Oral TID  . carvedilol  3.125 mg Oral  BID WC  . cefTRIAXone (ROCEPHIN)  IV  1 g Intravenous QHS  . clopidogrel  75 mg Oral Daily  . enoxaparin (LOVENOX) injection  1 mg/kg Subcutaneous Q24H  . famotidine  20 mg Oral Daily  . feeding supplement (ENSURE COMPLETE)  237 mL Oral BID BM  . insulin aspart  0-5 Units Subcutaneous QHS  . insulin aspart  0-9 Units Subcutaneous TID WC  . insulin NPH Human  14 Units Subcutaneous BID AC  . tamsulosin  0.4 mg Oral Daily  . vancomycin (VANCOCIN) 750 mg IVPB  750 mg Intravenous Q24H  . Warfarin - Pharmacist Dosing Inpatient   Does not apply q1800   Infusions:  . sodium chloride 1,000 mL (12/21/13 0655)   Assessment: 55 yoM admitted 11/4 with UTI started on Rocephin now [facility] with Gm + cocci in clusters. Vancomycin per Rx for  Bacteremia.   Goal of Therapy:  Vancomycin trough  level 15-20 mcg/ml  Plan:   Vancomycin 750mg  IV q24h  F/U Scr/levels/cultures  Lawana Pai R 12/22/2013,1:08 AM

## 2013-12-22 NOTE — Care Management Note (Addendum)
Needs INR check daily until level 2-3. Patient is also on Lovenox 75 mg twice daily subcutaneous which he needs to continue for 24 hours after INR is at therapeutic level. Please communicate INR levels with PCP.  [facility] informed of the above needs for [facility].  Marney Doctor RN,BSN,NCM

## 2013-12-22 NOTE — Plan of Care (Signed)
Problem: Phase II Progression Outcomes Goal: Progress activity as tolerated unless otherwise ordered Outcome: Completed/Met Date Met:  12/22/13 oob in chair for most of the day on 11/5

## 2013-12-22 NOTE — Progress Notes (Addendum)
[facility] lab called with + blood culture from 11/3. 1 aerobic bottle + with gram + cocci in clusters. Paged NP on call via amion.

## 2013-12-22 NOTE — Progress Notes (Signed)
Pt to be discharged home on lovenox injections BID. No family/caregiver has been present at the bedside and pt with short term memory impairment unable to be taught to do so. After speaking with pt's caregivers, decision made to hold discharge until first thing tomorrow morning when [facility] [address] can provide education to family/caregivers face to face. Also, caregiver stated that she would not be able to fill pts prescriptions tonight if he did return home because his pharmacy is in [place] and it was too late to travel there and back. Communicated this with CM and MD and Dr. Charlies Silvers verbalized OK to hold discharge until the morning. Hortencia Conradi RN.

## 2013-12-22 NOTE — Discharge Summary (Addendum)
Physician Discharge Summary  Hewitt Garner NWG:[id number] DOB: Jul 08, 1924 DOA: 12/19/2013  PCP: Pcp Not In System  Admit date: 12/19/2013 Discharge date: 12/22/2013  Recommendations for Outpatient Follow-up:  Patient is on Lovenox 75 mg twice daily. D/C Lovenox when INR therapeutic x 24 hours.  INR needs to be 2-3. HHRN order placed for INR check; results to be communicated with PCP. Adjust coumadin dose based on INR level.   Warfarin 7.5mg  po x 1 tonight, 12/22/2013.  Take Macrobid for 7 days on discharge for treatment of Escherichia coli UTI. Patient is sensitive to Macrobid based on urine culture sensitivity report.    Discharge wound care:    Complete by:  As directed  Persistent erythema at sacrum and buttocks Wound type:Pressure with incontinence associated dermatitis Pressure Ulcer POA: Yes-Stage 1 Measurement:6cm x 10cm  Wound bed:No open area Drainage (amount, consistency, odor) None Periwound:Intact, moist Dressing procedure/placement/frequency:Tturning and repositioning POC for this patient and  moisturizing to follow the skin conditioning cleanser twice daily and PRN. Additionally,  a pressure redistribution chair cushion for his use when OOB in the chair.       Discharge Diagnoses:  Principal Problem:   Acute UTI Active Problems:   Hyperlipidemia   Hypertension   History of MI (myocardial infarction)   UTI (lower urinary tract infection)   Elevated brain natriuretic peptide (BNP) level   Hyperglycemia   Acute kidney injury   Decubitus ulcer   History of DVT (deep vein thrombosis)   Diabetes mellitus    Discharge Condition: stable   Diet recommendation: as tolerated   History of present illness:  80 y.o. 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 On initial evaluation in the ED, the patient was noted  to have a subtherapeutic INR at 1.41, negative chest x-ray, and urinalysis concerning for infection.  Assessment/Plan:   Principal Problem:  Acute UTI, E.Coli  Was one empiric Rocephin.  Will take macrobid on discharge as prescribed.  One of the blood cultures on admission showed gram positive clusters, another was negative. This may be a contaminant rather than true infection.Ispoke with Dr. Megan Salon who said likely this is coag negative. We can follow this up outpatient and if it turns out to be staph aureus will have to arrange outpatient treatment but as mentioned usually one blood culture with cocci in clusters is coag negative.Also  Had 107 cc post-void residual. Continue flomax.  Active Problems:  Elevated BNP  Troponins negative. Chest x-ray clear.  May be reflective of decreased renal clearance given elevated creatinine.   Hyperglycemia / type II DM  Hemoglobin A1c is 7.7%.Resume home dose of NPH.   Acute kidney injury  Baseline creatinine unknown.  No improvement despite IV fluids, suspect that he has CKD. No hydronephrosis on renal U/S.  Follow-up FeNa.   Decubitus ulcer  Wound care RN consulted.Dressing recommendations noted above in follow up section.   Hyperlipidemia  Continue Lipitor.   Hypertension  Continue Coreg.   History of MI (myocardial infarction)  Status post stent.  Continue Lipitor, Plavix and Coreg.   DVT  On bridging Lovenox (INR subtherapeutic)and Coumadin per pharmacy.  Please note regimen above   Code Status: Full. Family Communication: No family at the bedside. Caregiver, Luanna Cole updated by telephone.     IV Access:    Peripheral IV   Procedures and diagnostic studies:   Dg  Chest 2 View 12/20/2013: No acute chest abnormality.   US Renal 12/20/2013: 1. Large right upper pole cyst, 10.8 cm. 2. No hydronephrosis. 3. Significant postvoid residual calculated to be 107 cc. 4. Debris within the  bladder.   Medical Consultants:    None.  Anti-Infectives:    Rocephin 12/20/13---> 12/23/2013   Signed:  Leisa Lenz, MD  [facility] 12/22/2013, 2:16 PM  Pager #: [phone]   Discharge Exam: Filed Vitals:   12/22/13 0530  BP: 143/63  Pulse: 73  Temp: 97.7 F (36.5 C)  Resp: 20   Filed Vitals:   12/21/13 1400 12/21/13 2040 12/21/13 2059 12/22/13 0530  BP: 130/60  143/66 143/63  Pulse: 80 68 81 73  Temp: 98 F (36.7 C)  98 F (36.7 C) 97.7 F (36.5 C)  TempSrc: Oral  Oral Oral  Resp: 18  20 20   Height:      Weight:      SpO2: 98%  100% 98%    General: Pt is sleeping, no acute dsitress Cardiovascular: Regular rate and rhythm, S1/S2 appreciated  Respiratory: no wheezing, no crackles, no rhonchi Neuro: Grossly nonfocal  Discharge Instructions  Discharge Instructions    Call MD for:  difficulty breathing, headache or visual disturbances    Complete by:  As directed      Call MD for:  persistant dizziness or light-headedness    Complete by:  As directed      Call MD for:  persistant nausea and vomiting    Complete by:  As directed      Call MD for:  severe uncontrolled pain    Complete by:  As directed      Diet - low sodium heart healthy    Complete by:  As directed      Discharge instructions    Complete by:  As directed   Patient is on Lovenox 75 mg twice daily. D/C Lovenox when INR therapeutic x 24 hours.  INR needs to be 2-3. HHRN order placed for INR check; results to be communicated with PCP. Adjust coumadin dose based on INR level.   Warfarin 7.5mg  po x 1 tonight, 12/22/2013.  Take Macrobid for 7 days on discharge for treatment of Escherichia coli UTI. Patient is sensitive to Macrobid based on urine culture sensitivity report.     Discharge wound care:    Complete by:  As directed   Persistent erythema at sacrum and buttocks Wound type:Pressure with incontinence associated dermatitis Pressure Ulcer POA: Yes-Stage  1 Measurement:6cm x 10cm  Wound bed:No open area Drainage (amount, consistency, odor) None Periwound:Intact, moist Dressing procedure/placement/frequency:Tturning and repositioning POC for this patient and  moisturizing to follow the skin conditioning cleanser twice daily and PRN. Additionally,  a pressure redistribution chair cushion for his use when OOB in the chair.     Increase activity slowly    Complete by:  As directed             Medication List    TAKE these medications        acetaminophen-codeine 300-30 MG per tablet  Commonly known as:  TYLENOL #3  Take 1 tablet by mouth 2 (two) times daily as needed for moderate pain.     antiseptic oral rinse 0.05 % Liqd solution  Commonly known as:  CPC / CETYLPYRIDINIUM CHLORIDE 0.05%  7 mLs by Mouth Rinse route 2 (two) times daily.     atorvastatin 10 MG tablet  Commonly known as:  LIPITOR  Take 10  mg by mouth daily.     busPIRone 15 MG tablet  Commonly known as:  BUSPAR  Take 1 tablet (15 mg total) by mouth 2 (two) times daily.     carvedilol 3.125 MG tablet  Commonly known as:  COREG  Take 3.125 mg by mouth 2 (two) times daily with a meal.     clopidogrel 75 MG tablet  Commonly known as:  PLAVIX  Take 75 mg by mouth daily.     DESITIN EX  Apply 1 application topically as needed.     enoxaparin 80 MG/0.8ML injection  Commonly known as:  LOVENOX  Inject 0.75 mLs (75 mg total) into the skin every 12 (twelve) hours.     eucerin cream  Apply 1 application topically as needed for dry skin.     feeding supplement (ENSURE COMPLETE) Liqd  Take 237 mLs by mouth 2 (two) times daily between meals.     insulin NPH Human 100 UNIT/ML injection  Commonly known as:  HUMULIN N,NOVOLIN N  Inject 14 Units into the skin 2 (two) times daily before a meal.     ranitidine 150 MG capsule  Commonly known as:  ZANTAC  Take 150 mg by mouth 2 (two) times daily.     tamsulosin 0.4 MG Caps capsule  Commonly known as:  FLOMAX   Take 1 capsule (0.4 mg total) by mouth daily.     warfarin 7.5 MG tablet  Commonly known as:  COUMADIN  Take 1 tablet (7.5 mg total) by mouth one time only at 6 PM.           Follow-up Information    Follow up with Pcp Not In System.       The results of significant diagnostics from this hospitalization (including imaging, microbiology, ancillary and laboratory) are listed below for reference.    Significant Diagnostic Studies: Dg Chest 2 View  12/20/2013   CLINICAL DATA:  Weakness generalized.  UTI.  Fever.  EXAM: CHEST  2 VIEW  COMPARISON:  None.  FINDINGS: Left dual lead cardiac pacemaker. Heart size is upper limits of normal. Lungs are clear without airspace disease or pulmonary edema. Degenerative changes in the thoracic spine. No definite pleural effusions.  IMPRESSION: No acute chest abnormality.   Electronically Signed   By: Markus Daft M.D.   On: 12/20/2013 01:11   US Renal  12/20/2013   CLINICAL DATA:  Acute kidney injury. History of diabetes, hypertension, hyperlipidemia. Incontinence.  EXAM: RENAL/URINARY TRACT ULTRASOUND COMPLETE  COMPARISON:  None.  FINDINGS: Right Kidney:  Length: 11.7 cm. Echogenicity is normal. Large upper pole cyst is 10.8 x 8.1 x 10.4 cm. No hydronephrosis.  Left Kidney:  Length: 11.0 cm. Echogenicity within normal limits. No mass or hydronephrosis visualized.  Bladder:  Within the posterior aspect of the bladder there is echogenic debris. Prevoid volume is calculated to be 196 cc. Postvoid residual is 107 cc.  IMPRESSION: 1. Large right upper pole cyst, 10.8 cm. 2. No hydronephrosis. 3. Significant postvoid residual calculated to be 107 cc. 4. Debris within the bladder.   Electronically Signed   By: Shon Hale M.D.   On: 12/20/2013 20:39    Microbiology: Recent Results (from the past 240 hour(s))  Urine culture     Status: None   Collection Time: 12/20/13  1:59 AM  Result Value Ref Range Status   Specimen Description URINE, RANDOM  Final    Special Requests NONE  Final   Culture  Setup Time  Final    12/20/2013 08:38 Performed at [facility]   Final    >=100,000 COLONIES/ML Performed at [facility]    Culture   Final    ESCHERICHIA COLI Performed at [facility]    Report Status 12/22/2013 FINAL  Final   Organism ID, Bacteria ESCHERICHIA COLI  Final      Susceptibility   Escherichia coli - MIC*    AMPICILLIN >=32 RESISTANT Resistant     CEFAZOLIN >=64 RESISTANT Resistant     CEFTRIAXONE <=1 SENSITIVE Sensitive     CIPROFLOXACIN >=4 RESISTANT Resistant     GENTAMICIN <=1 SENSITIVE Sensitive     LEVOFLOXACIN >=8 RESISTANT Resistant     NITROFURANTOIN <=16 SENSITIVE Sensitive     TOBRAMYCIN <=1 SENSITIVE Sensitive     TRIMETH/SULFA <=20 SENSITIVE Sensitive     PIP/TAZO 8 SENSITIVE Sensitive     * ESCHERICHIA COLI  Culture, blood (routine x 2)     Status: None (Preliminary result)   Collection Time: 12/20/13  7:15 AM  Result Value Ref Range Status   Specimen Description BLOOD LEFT ARM  Final   Special Requests BOTTLES DRAWN AEROBIC ONLY 5CC  Final   Culture  Setup Time   Final    12/20/2013 10:12 Performed at [facility]    Culture   Final           BLOOD CULTURE RECEIVED NO GROWTH TO DATE CULTURE WILL BE HELD FOR 5 DAYS BEFORE ISSUING A FINAL NEGATIVE REPORT Performed at [facility]    Report Status PENDING  Incomplete  Culture, blood (routine x 2)     Status: None (Preliminary result)   Collection Time: 12/20/13  7:20 AM  Result Value Ref Range Status   Specimen Description BLOOD LEFT ANTECUBITAL  Final   Special Requests BOTTLES DRAWN AEROBIC ONLY 5CC  Final   Culture  Setup Time   Final    12/20/2013 10:13 Performed at [facility]    Culture   Final    GRAM POSITIVE COCCI IN CLUSTERS Note: Gram Stain Report Called to,Read Back By and Verified With: JENNIFER DODSON ON 12/21/2013 AT 11:55P BY WILEJ Performed at [facility]     Report Status PENDING  Incomplete     Labs: Basic Metabolic Panel:  Recent Labs Lab 12/20/13 0128 12/20/13 0725 12/21/13 0413 12/22/13 0500  NA 137 143 139 139  K 4.7 4.5 4.1 3.8  CL 100 105 104 104  CO2 22 23 23 21   GLUCOSE 189* 158* 146* 142*  BUN 24* 24* 25* 22  CREATININE 1.52* 1.66* 1.78* 1.46*  CALCIUM 9.6 9.3 8.7 8.6   Liver Function Tests:  Recent Labs Lab 12/20/13 0128  AST 20  ALT 16  ALKPHOS 77  BILITOT 0.4  PROT 8.0  ALBUMIN 3.2*   No results for input(s): LIPASE, AMYLASE in the last 168 hours. No results for input(s): AMMONIA in the last 168 hours. CBC:  Recent Labs Lab 12/20/13 0128 12/20/13 0725 12/21/13 0413 12/22/13 0500  WBC 6.8 6.4 6.6 5.9  NEUTROABS 4.8  --   --   --   HGB 13.3 12.7* 11.6* 10.9*  HCT 40.2 38.8* 36.1* 33.8*  MCV 83.1 84.2 84.9 83.5  PLT 325 291 298 281   Cardiac Enzymes:  Recent Labs Lab 12/20/13 0128  TROPONINI <0.30   BNP: BNP (last 3 results)  Recent Labs  12/20/13 0128  PROBNP 3532.0*  CBG:  Recent Labs Lab 12/21/13 1158 12/21/13 1624 12/21/13 2058 12/22/13 0725 12/22/13 1200  GLUCAP 164* 194* 171* 139* 159*    Time coordinating discharge: Over 30 minutes

## 2013-12-22 NOTE — Progress Notes (Signed)
[place] for Lovenox, warfarin Indication: history of DVT  No Known Allergies  Patient Measurements: Height: 5\' 7"  (170.2 cm) Weight: 170 lb (77.111 kg) IBW/kg (Calculated) : 66.1  Vital Signs: Temp: 97.7 F (36.5 C) (11/06 0530) Temp Source: Oral (11/06 0530) BP: 143/63 mmHg (11/06 0530) Pulse Rate: 73 (11/06 0530)  Labs:  Recent Labs  12/20/13 0128 12/20/13 0725 12/21/13 0413 12/22/13 0500  HGB 13.3 12.7* 11.6* 10.9*  HCT 40.2 38.8* 36.1* 33.8*  PLT 325 291 298 281  APTT 35  --   --   --   LABPROT 17.4* 17.6* 17.5* 18.2*  INR 1.41 1.43 1.42 1.50*  CREATININE 1.52* 1.66* 1.78* 1.46*  TROPONINI <0.30  --   --   --     Estimated Creatinine Clearance: 33.3 mL/min (by C-G formula based on Cr of 1.46).   Medical History: Past Medical History  Diagnosis Date  . Diabetes mellitus without complication   . Anxiety   . MI (myocardial infarction)   . Hypertension   . Hyperlipidemia     Medications:  Scheduled:  . antiseptic oral rinse  7 mL Mouth Rinse BID  . atorvastatin  10 mg Oral q1800  . busPIRone  15 mg Oral TID  . carvedilol  3.125 mg Oral BID WC  . cefTRIAXone (ROCEPHIN)  IV  1 g Intravenous QHS  . clopidogrel  75 mg Oral Daily  . enoxaparin (LOVENOX) injection  1 mg/kg Subcutaneous Q24H  . famotidine  20 mg Oral Daily  . feeding supplement (ENSURE COMPLETE)  237 mL Oral BID BM  . insulin aspart  0-5 Units Subcutaneous QHS  . insulin aspart  0-9 Units Subcutaneous TID WC  . insulin NPH Human  14 Units Subcutaneous BID AC  . tamsulosin  0.4 mg Oral Daily  . vancomycin (VANCOCIN) 750 mg IVPB  750 mg Intravenous Q24H  . Warfarin - Pharmacist Dosing Inpatient   Does not apply q1800   Infusions:  . sodium chloride 1,000 mL (12/22/13 5625)    Assessment: 79 yo male presented to ER early this AM with lower abd pain. Per notes, caregiver states that he is not walking and eating well as usual. Also has chronic right  leg edema and tenderness in lower leg. Patient found to have UTI started on antibiotics. Note that patient was on warfarin prior to admission and INR was subtherapeutic on admission. Started on IV heparin and now transitioning to full dose Lovenox until INR therapeutic   Warfarin dose PTA: 5mg  daily  INR 1.5 today (SUBtherapeutic)  CBC ok  No reported bleeding  Patient also with positive UA on Rocephin  Renal: SCr improved to 1.46 CrCl 28ml/min  Warfarin doses: 11/4: 5mg , 11/5: 5mg   Goal of Therapy:  INR 2-3 Anti-Xa level 0.6-1 units/ml 4hrs after LMWH dose given Monitor platelets by anticoagulation protocol: Yes   Plan:  1) Adjust Lovenox to 1mg /kg (75mg ) Q12H for improved renal function 3) Monitor renal function for any possible Lovenox dosage changes 4) D/C Lovenox when INR therapeutic x 24 hours 5) Warfarin 7.5mg  po x 1 tonight 6) Daily INR  Kizzie Furnish, PharmD Pager: [phone] 12/22/2013 7:26 AM

## 2013-12-22 NOTE — Progress Notes (Signed)
Physical Therapy Treatment Patient Details Name: Ozzie Knobel MRN: [id number] DOB: 11/21/1924 Today's Date: 12/22/2013    History of Present Illness Pt was admitted for acute UTI.  He has a h/o HRN, Pacemaker, CAD and MI    PT Comments    Patient is very pleasant and cooperative this session.  Pt is min-mod A with all mobility and requires increased time to perform due R knee pain.  Pt continues to refuse PT recommendation for SNF and insists that he will d/c to his daughters home; daughter has a ramp to enter home.  Will inform LPT  Follow Up Recommendations  SNF rec by LPT however pt declines and plans to go to his daughters home Already has a RW Will need HH PT Will update LPT on D/C change     Equipment Recommendations  None recommended by PT    Recommendations for Other Services       Precautions / Restrictions Precautions Precautions: Fall Restrictions Weight Bearing Restrictions: No    Mobility  Bed Mobility                  Transfers Overall transfer level: Needs assistance Equipment used: Rolling walker (2 wheeled) Transfers: Sit to/from Stand Sit to Stand: Min assist         General transfer comment: pt requires increased time to perform  Ambulation/Gait Ambulation/Gait assistance: Min assist;+2 safety/equipment Ambulation Distance (Feet): 20 Feet Assistive device: Rolling walker (2 wheeled) Gait Pattern/deviations: Decreased step length - left;Decreased stance time - right;Decreased weight shift to right;Antalgic Gait velocity: slow   General Gait Details: pt requires increased time and is limited d/t R knee pain; +2 A for equipment only   Stairs            Wheelchair Mobility    Modified Rankin (Stroke Patients Only)       Balance                                    Cognition Arousal/Alertness: Awake/alert Behavior During Therapy: WFL for tasks assessed/performed Overall Cognitive Status: No family/caregiver  present to determine baseline cognitive functioning                      Exercises General Exercises - Lower Extremity Ankle Circles/Pumps: AROM;Both;10 reps Quad Sets: AROM;Both;10 reps Gluteal Sets: AROM;Both;10 reps Heel Slides: Strengthening;Both;10 reps Straight Leg Raises: AROM;Both;10 reps    General Comments        Pertinent Vitals/Pain Pain Assessment: 0-10 Pain Score: 7  Pain Location: R knee Pain Intervention(s): Limited activity within patient's tolerance;Monitored during session    Home Living                      Prior Function            PT Goals (current goals can now be found in the care plan section) Progress towards PT goals: Progressing toward goals    Frequency  Min 3X/week    PT Plan Current plan remains appropriate    Co-evaluation             End of Session Equipment Utilized During Treatment: Gait belt Activity Tolerance: Patient limited by pain Patient left: in chair;with call bell/phone within reach;with chair alarm set     Time: 1045-1109 PT Time Calculation (min): 24 min  Charges:  G Codes:      Miller,Derrick, SPTA 12/22/2013, 12:53 PM   Reviewed above  Rica Koyanagi  PTA [facility]  Acute  Rehab Pager      [phone]

## 2013-12-22 NOTE — Plan of Care (Signed)
Problem: Phase II Progression Outcomes Goal: Tolerating diet Outcome: Completed/Met Date Met:  12/22/13     

## 2013-12-22 NOTE — Plan of Care (Signed)
Problem: Phase II Progression Outcomes Goal: Vital signs remain stable, temperature < 100 Outcome: Completed/Met Date Met:  12/22/13

## 2013-12-22 NOTE — Discharge Instructions (Signed)

## 2013-12-23 DIAGNOSIS — R799 Abnormal finding of blood chemistry, unspecified: Secondary | ICD-10-CM

## 2013-12-23 LAB — CBC
HCT: 36.3 % — ABNORMAL LOW (ref 39.0–52.0)
Hemoglobin: 11.7 g/dL — ABNORMAL LOW (ref 13.0–17.0)
MCH: 26.8 pg (ref 26.0–34.0)
MCHC: 32.2 g/dL (ref 30.0–36.0)
MCV: 83.3 fL (ref 78.0–100.0)
Platelets: 285 10*3/uL (ref 150–400)
RBC: 4.36 MIL/uL (ref 4.22–5.81)
RDW: 15.7 % — ABNORMAL HIGH (ref 11.5–15.5)
WBC: 5.6 10*3/uL (ref 4.0–10.5)

## 2013-12-23 LAB — GLUCOSE, CAPILLARY
GLUCOSE-CAPILLARY: 118 mg/dL — AB (ref 70–99)
Glucose-Capillary: 96 mg/dL (ref 70–99)

## 2013-12-23 LAB — CULTURE, BLOOD (ROUTINE X 2)

## 2013-12-23 LAB — PROTIME-INR
INR: 1.47 (ref 0.00–1.49)
Prothrombin Time: 18 seconds — ABNORMAL HIGH (ref 11.6–15.2)

## 2013-12-23 MED ORDER — WARFARIN SODIUM 7.5 MG PO TABS
7.5000 mg | ORAL_TABLET | Freq: Once | ORAL | Status: DC
  Filled 2013-12-23: qty 1

## 2013-12-23 MED ORDER — ENOXAPARIN (LOVENOX) PATIENT EDUCATION KIT
PACK | Freq: Once | Status: AC
  Administered 2013-12-23: 13:00:00
  Filled 2013-12-23: qty 1

## 2013-12-23 NOTE — Progress Notes (Signed)
Pt for discharge home today. Per RN, pt needs ambulance transport home.  CSW had confirmed address with pt daughter yesterday.   Ambulance transport form completed and provided to RN. RN plans to arrange ambulance transport to home. RN provided ambulance transport phone number [phone].  No further social work needs identified at this time.  CSW signing off.   Alison Murray, MSW, [place] Work [phone]

## 2013-12-23 NOTE — Progress Notes (Signed)
Progress Note   [name]:[id number] DOB: May 18, 1924 DOA: 12/19/2013 PCP: Pcp Not In System  Dr. Rella Larve in [place].   Brief Narrative:   Randy [name] is an 80 y.o. 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  On initial evaluation in the ED, the patient was noted to have a subtherapeutic INR at 1.41, negative chest x-ray, and urinalysis concerning for infection.  Barrier to discharge 12/22/13 was inability to obtain Lovenox in a timely manner. Discharge summary done by Dr. Rogers Blocker 12/22/13.  Assessment/Plan:   Principal Problem:   Acute UTI  Initially treated withempiric Rocephin.  Discharged on a course of Macrobid.  Follow-up blood and urine cultures.  Continue flomax.  One of 2 blood cultures grew coagulase-negative staph, felt to be a contaminant.  Active Problems:   Elevated BNP  Troponins negative.  Chest x-ray clear.  May be reflective of decreased renal clearance given elevated creatinine.    Hyperglycemia / type II DM  Hemoglobin A1c is 7.7%.  CBGs 96-159.  Continue home dose of NPH.    Acute kidney injury  Baseline creatinine unknown.  No improvement despite IV fluids, suspect that he has CKD.  No hydronephrosis on renal U/S.  FeNa was 0.67%, indicative of a prerenal etiology, however, creatinine has not returned to baseline despite IV fluids, so suspect that the patient has an element of diabetic nephropathy.    Decubitus ulcer  Seen by wound care RN 12/20/13. Wound care/pressure reduction recommendations noted.    Hyperlipidemia  Continue Lipitor.    Hypertension  Continue Coreg.    History of MI (myocardial infarction)  Status post stent.  Continue Lipitor, Plavix and Coreg.    DVT  On bridging Lovenox (INR subtherapeutic)and Coumadin per pharmacy.  INR not yet  therapeutic.  Discharge home on Lovenox until INR therapeutic 48 hours. Home health RN to draw PT/INR results and fax to PCP.  Code Status: Full. Family Communication: No family at the bedside.   Disposition Plan: Home when stable.  Has 2 caregivers at all times.   IV Access:    Peripheral IV   Procedures and diagnostic studies:   Dg Chest 2 View 12/20/2013: No acute chest abnormality.     US Renal 12/20/2013: 1. Large right upper pole cyst, 10.8 cm. 2. No hydronephrosis. 3. Significant postvoid residual calculated to be 107 cc. 4. Debris within the bladder.    Medical Consultants:    None.  Anti-Infectives:    Rocephin 12/20/13--->  Subjective:   Randy [name] is without complaints.  No abdominal pain, nausea or vomiting. Feels ready for discharge.   Objective:    Filed Vitals:   12/22/13 0530 12/22/13 1400 12/22/13 2052 12/23/13 0511  BP: 143/63 142/62 150/65 140/69  Pulse: 73 70 73 71  Temp: 97.7 F (36.5 C) 98 F (36.7 C) 97.5 F (36.4 C) 98.1 F (36.7 C)  TempSrc: Oral Oral Oral Oral  Resp: 20 20 18 16   Height:      Weight:      SpO2: 98% 98% 99% 98%    Intake/Output Summary (Last 24 hours) at 12/23/13 1253 Last data filed at 12/23/13 0900  Gross per 24 hour  Intake    880 ml  Output   1925 ml  Net  -1045 ml    Exam: Gen:  NAD Cardiovascular:  RRR, No  M/R/G Respiratory:  Lungs CTAB Gastrointestinal:  Abdomen soft, NT/ND, + BS Extremities:  Trace edema, venous stasis changes   Data Reviewed:    Labs: Basic Metabolic Panel:  Recent Labs Lab 12/20/13 0128 12/20/13 0725 12/21/13 0413 12/22/13 0500  NA 137 143 139 139  K 4.7 4.5 4.1 3.8  CL 100 105 104 104  CO2 22 23 23 21   GLUCOSE 189* 158* 146* 142*  BUN 24* 24* 25* 22  CREATININE 1.52* 1.66* 1.78* 1.46*  CALCIUM 9.6 9.3 8.7 8.6   GFR Estimated Creatinine Clearance: 33.3 mL/min (by C-G formula based on Cr of 1.46). Liver Function Tests:  Recent Labs Lab 12/20/13 0128   AST 20  ALT 16  ALKPHOS 77  BILITOT 0.4  PROT 8.0  ALBUMIN 3.2*   Coagulation profile  Recent Labs Lab 12/20/13 0128 12/20/13 0725 12/21/13 0413 12/22/13 0500 12/23/13 0525  INR 1.41 1.43 1.42 1.50* 1.47    CBC:  Recent Labs Lab 12/20/13 0128 12/20/13 0725 12/21/13 0413 12/22/13 0500 12/23/13 0525  WBC 6.8 6.4 6.6 5.9 5.6  NEUTROABS 4.8  --   --   --   --   HGB 13.3 12.7* 11.6* 10.9* 11.7*  HCT 40.2 38.8* 36.1* 33.8* 36.3*  MCV 83.1 84.2 84.9 83.5 83.3  PLT 325 291 298 281 285   Cardiac Enzymes:  Recent Labs Lab 12/20/13 0128  TROPONINI <0.30   BNP (last 3 results)  Recent Labs  12/20/13 0128  PROBNP 3532.0*   CBG:  Recent Labs Lab 12/22/13 0725 12/22/13 1200 12/22/13 1744 12/22/13 2057 12/23/13 0746  GLUCAP 139* 159* 143* 118* 96   Hgb A1c: No results for input(s): HGBA1C in the last 72 hours.  Urinalysis    Component Value Date/Time   COLORURINE YELLOW 12/20/2013 0159   APPEARANCEUR TURBID* 12/20/2013 0159   LABSPEC 1.018 12/20/2013 0159   PHURINE 5.0 12/20/2013 0159   GLUCOSEU NEGATIVE 12/20/2013 0159   HGBUR MODERATE* 12/20/2013 0159   BILIRUBINUR NEGATIVE 12/20/2013 0159   KETONESUR NEGATIVE 12/20/2013 0159   PROTEINUR 30* 12/20/2013 0159   UROBILINOGEN 0.2 12/20/2013 0159   NITRITE POSITIVE* 12/20/2013 0159   LEUKOCYTESUR LARGE* 12/20/2013 0159    Sepsis Labs:  Recent Labs Lab 12/20/13 0139 12/20/13 0725 12/21/13 0413 12/22/13 0500 12/23/13 0525  WBC  --  6.4 6.6 5.9 5.6  LATICACIDVEN 1.72  --   --   --   --    Microbiology Recent Results (from the past 240 hour(s))  Urine culture     Status: None   Collection Time: 12/20/13  1:59 AM  Result Value Ref Range Status   Specimen Description URINE, RANDOM  Final   Special Requests NONE  Final   Culture  Setup Time   Final    12/20/2013 08:38 Performed at [facility]   Final    >=100,000 COLONIES/ML Performed at [facility]     Culture   Final    ESCHERICHIA COLI Performed at [facility]    Report Status 12/22/2013 FINAL  Final   Organism ID, Bacteria ESCHERICHIA COLI  Final      Susceptibility   Escherichia coli - MIC*    AMPICILLIN >=32 RESISTANT Resistant     CEFAZOLIN >=64 RESISTANT Resistant     CEFTRIAXONE <=1 SENSITIVE Sensitive     CIPROFLOXACIN >=4 RESISTANT Resistant     GENTAMICIN <=1 SENSITIVE Sensitive     LEVOFLOXACIN >=8 RESISTANT Resistant  NITROFURANTOIN <=16 SENSITIVE Sensitive     TOBRAMYCIN <=1 SENSITIVE Sensitive     TRIMETH/SULFA <=20 SENSITIVE Sensitive     PIP/TAZO 8 SENSITIVE Sensitive     * ESCHERICHIA COLI  Culture, blood (routine x 2)     Status: None (Preliminary result)   Collection Time: 12/20/13  7:15 AM  Result Value Ref Range Status   Specimen Description BLOOD LEFT ARM  Final   Special Requests BOTTLES DRAWN AEROBIC ONLY 5CC  Final   Culture  Setup Time   Final    12/20/2013 10:12 Performed at [facility]    Culture   Final           BLOOD CULTURE RECEIVED NO GROWTH TO DATE CULTURE WILL BE HELD FOR 5 DAYS BEFORE ISSUING A FINAL NEGATIVE REPORT Performed at [facility]    Report Status PENDING  Incomplete  Culture, blood (routine x 2)     Status: None   Collection Time: 12/20/13  7:20 AM  Result Value Ref Range Status   Specimen Description BLOOD LEFT ANTECUBITAL  Final   Special Requests BOTTLES DRAWN AEROBIC ONLY 5CC  Final   Culture  Setup Time   Final    12/20/2013 10:13 Performed at [facility]    Culture   Final    STAPHYLOCOCCUS SPECIES (COAGULASE NEGATIVE) Note: THE SIGNIFICANCE OF ISOLATING THIS ORGANISM FROM A SINGLE SET OF BLOOD CULTURES WHEN MULTIPLE SETS ARE DRAWN IS UNCERTAIN. PLEASE NOTIFY THE MICROBIOLOGY DEPARTMENT WITHIN ONE WEEK IF SPECIATION AND SENSITIVITIES ARE REQUIRED. Note: Gram Stain Report Called to,Read Back By and Verified With: JENNIFER DODSON ON 12/21/2013 AT 11:55P BY WILEJ Performed at  [facility]    Report Status 12/23/2013 FINAL  Final     Medications:   . antiseptic oral rinse  7 mL Mouth Rinse BID  . atorvastatin  10 mg Oral q1800  . busPIRone  15 mg Oral TID  . carvedilol  3.125 mg Oral BID WC  . cefTRIAXone (ROCEPHIN)  IV  1 g Intravenous QHS  . clopidogrel  75 mg Oral Daily  . enoxaparin (LOVENOX) injection  1 mg/kg Subcutaneous Q12H  . enoxaparin   Does not apply Once  . famotidine  20 mg Oral Daily  . feeding supplement (ENSURE COMPLETE)  237 mL Oral BID BM  . insulin aspart  0-5 Units Subcutaneous QHS  . insulin aspart  0-9 Units Subcutaneous TID WC  . insulin NPH Human  14 Units Subcutaneous BID AC  . tamsulosin  0.4 mg Oral Daily  . vancomycin (VANCOCIN) 750 mg IVPB  750 mg Intravenous Q24H  . warfarin  7.5 mg Oral ONCE-1800  . Warfarin - Pharmacist Dosing Inpatient   Does not apply q1800   Continuous Infusions: . sodium chloride 1,000 mL (12/22/13 0639)    Time spent: 25 minutes.   LOS: 4 days   [facility] Pager [phone]. If unable to reach me by pager, please call my cell phone at [phone].  *Please refer to [email or web address], password [email or web address] to get updated schedule on who will round on this patient, as hospitalists switch teams weekly. If 7PM-7AM, please contact night-coverage at [email or web address], password [email or web address] for any overnight needs.  12/23/2013, 12:53 PM

## 2013-12-23 NOTE — Progress Notes (Signed)
Patient discharged to home via [facility], Discharge instructions reviewed with patients daughter who verbalized understanding. Lovenox injection reviewed with care giver over the phone and Lovenox education Kit sent with patient. New RX's sent with patient.

## 2013-12-23 NOTE — Progress Notes (Signed)
Randy [name] for Lovenox, warfarin Indication: history of DVT  No Known Allergies  Patient Measurements: Height: 5\' 7"  (170.2 cm) Weight: 170 lb (77.111 kg) IBW/kg (Calculated) : 66.1  Vital Signs: Temp: 98.1 F (36.7 C) (11/07 0511) Temp Source: Oral (11/07 0511) BP: 140/69 mmHg (11/07 0511) Pulse Rate: 71 (11/07 0511)  Labs:  Recent Labs  12/21/13 0413 12/22/13 0500 12/23/13 0525  HGB 11.6* 10.9* 11.7*  HCT 36.1* 33.8* 36.3*  PLT 298 281 285  LABPROT 17.5* 18.2* 18.0*  INR 1.42 1.50* 1.47  CREATININE 1.78* 1.46*  --     Estimated Creatinine Clearance: 33.3 mL/min (by C-G formula based on Cr of 1.46).   Medical History: Past Medical History  Diagnosis Date  . Diabetes mellitus without complication   . Anxiety   . MI (myocardial infarction)   . Hypertension   . Hyperlipidemia     Medications:  Scheduled:  . antiseptic oral rinse  7 mL Mouth Rinse BID  . atorvastatin  10 mg Oral q1800  . busPIRone  15 mg Oral TID  . carvedilol  3.125 mg Oral BID WC  . cefTRIAXone (ROCEPHIN)  IV  1 g Intravenous QHS  . clopidogrel  75 mg Oral Daily  . enoxaparin (LOVENOX) injection  1 mg/kg Subcutaneous Q12H  . famotidine  20 mg Oral Daily  . feeding supplement (ENSURE COMPLETE)  237 mL Oral BID BM  . insulin aspart  0-5 Units Subcutaneous QHS  . insulin aspart  0-9 Units Subcutaneous TID WC  . insulin NPH Human  14 Units Subcutaneous BID AC  . tamsulosin  0.4 mg Oral Daily  . vancomycin (VANCOCIN) 750 mg IVPB  750 mg Intravenous Q24H  . Warfarin - Pharmacist Dosing Inpatient   Does not apply q1800   Infusions:  . sodium chloride 1,000 mL (12/22/13 7989)    Assessment: 80 yo male presented to ER early this AM with lower abd pain. Per notes, caregiver states that he is not walking and eating well as usual. Also has chronic right leg edema and tenderness in lower leg. Patient found to have UTI started on antibiotics. Note that patient  was on warfarin prior to admission and INR was subtherapeutic on admission. Started on IV heparin and now transitioning to full dose Lovenox until INR therapeutic   Warfarin dose PTA: 5mg  daily  INR 1.47 today (SUBtherapeutic)  CBC ok  No reported bleeding  Patient also with positive UA on Rocephin  Renal: SCr improved to 1.46 CrCl 72ml/min  Warfarin doses: 11/4: 5mg , 11/5: 5mg  11/6: 7.5mg   Goal of Therapy:  INR 2-3 Anti-Xa level 0.6-1 units/ml 4hrs after LMWH dose given Monitor platelets by anticoagulation protocol: Yes   Plan:  1) Continue Lovenox 1mg /kg (75mg ) Q12H 3) Monitor renal function for any possible Lovenox dosage changes 4) D/C Lovenox when INR therapeutic x 24 hours 5) Warfarin 7.5mg  po x 1 tonight 6) Daily INR  [name] RPh 12/23/2013, 12:11 PM Pager [phone]

## 2013-12-23 NOTE — Progress Notes (Signed)
Notified [facility] of scheduled dc home today with HH and Lovenox SQ injection needed. Jonnie Finner RN CCM Case Mgmt phone [phone]

## 2013-12-26 LAB — CULTURE, BLOOD (ROUTINE X 2): Culture: NO GROWTH

## 2014-01-03 ENCOUNTER — Inpatient Hospital Stay (HOSPITAL_COMMUNITY)
Admission: EM | Admit: 2014-01-03 | Discharge: 2014-01-09 | DRG: 871 | Disposition: A | Discharge status: Skilled Nursing Facility | Payer: Medicare Other | Attending: Internal Medicine | Admitting: Internal Medicine

## 2014-01-03 ENCOUNTER — Encounter (HOSPITAL_COMMUNITY): Payer: Self-pay | Admitting: Emergency Medicine

## 2014-01-03 ENCOUNTER — Emergency Department (HOSPITAL_COMMUNITY): Payer: Medicare Other

## 2014-01-03 DIAGNOSIS — R4182 Altered mental status, unspecified: Secondary | ICD-10-CM

## 2014-01-03 DIAGNOSIS — L8990 Pressure ulcer of unspecified site, unspecified stage: Secondary | ICD-10-CM

## 2014-01-03 DIAGNOSIS — Z7901 Long term (current) use of anticoagulants: Secondary | ICD-10-CM

## 2014-01-03 DIAGNOSIS — R739 Hyperglycemia, unspecified: Secondary | ICD-10-CM

## 2014-01-03 DIAGNOSIS — B9620 Unspecified Escherichia coli [E. coli] as the cause of diseases classified elsewhere: Secondary | ICD-10-CM | POA: Diagnosis present

## 2014-01-03 DIAGNOSIS — Z86718 Personal history of other venous thrombosis and embolism: Secondary | ICD-10-CM

## 2014-01-03 DIAGNOSIS — E119 Type 2 diabetes mellitus without complications: Secondary | ICD-10-CM

## 2014-01-03 DIAGNOSIS — A419 Sepsis, unspecified organism: Secondary | ICD-10-CM | POA: Diagnosis not present

## 2014-01-03 DIAGNOSIS — I2510 Atherosclerotic heart disease of native coronary artery without angina pectoris: Secondary | ICD-10-CM | POA: Diagnosis present

## 2014-01-03 DIAGNOSIS — E785 Hyperlipidemia, unspecified: Secondary | ICD-10-CM | POA: Diagnosis present

## 2014-01-03 DIAGNOSIS — Z950 Presence of cardiac pacemaker: Secondary | ICD-10-CM

## 2014-01-03 DIAGNOSIS — N183 Chronic kidney disease, stage 3 (moderate): Secondary | ICD-10-CM | POA: Diagnosis present

## 2014-01-03 DIAGNOSIS — I129 Hypertensive chronic kidney disease with stage 1 through stage 4 chronic kidney disease, or unspecified chronic kidney disease: Secondary | ICD-10-CM | POA: Diagnosis present

## 2014-01-03 DIAGNOSIS — R531 Weakness: Secondary | ICD-10-CM

## 2014-01-03 DIAGNOSIS — I10 Essential (primary) hypertension: Secondary | ICD-10-CM | POA: Diagnosis present

## 2014-01-03 DIAGNOSIS — N179 Acute kidney failure, unspecified: Secondary | ICD-10-CM

## 2014-01-03 DIAGNOSIS — N390 Urinary tract infection, site not specified: Secondary | ICD-10-CM | POA: Diagnosis present

## 2014-01-03 DIAGNOSIS — G9340 Encephalopathy, unspecified: Secondary | ICD-10-CM | POA: Diagnosis present

## 2014-01-03 DIAGNOSIS — Z794 Long term (current) use of insulin: Secondary | ICD-10-CM

## 2014-01-03 DIAGNOSIS — K219 Gastro-esophageal reflux disease without esophagitis: Secondary | ICD-10-CM | POA: Diagnosis present

## 2014-01-03 DIAGNOSIS — I252 Old myocardial infarction: Secondary | ICD-10-CM

## 2014-01-03 DIAGNOSIS — F419 Anxiety disorder, unspecified: Secondary | ICD-10-CM | POA: Diagnosis present

## 2014-01-03 DIAGNOSIS — R7989 Other specified abnormal findings of blood chemistry: Secondary | ICD-10-CM

## 2014-01-03 DIAGNOSIS — Z87891 Personal history of nicotine dependence: Secondary | ICD-10-CM

## 2014-01-03 LAB — COMPREHENSIVE METABOLIC PANEL
ALBUMIN: 3.3 g/dL — AB (ref 3.5–5.2)
ALK PHOS: 80 U/L (ref 39–117)
ALT: 23 U/L (ref 0–53)
ANION GAP: 16 — AB (ref 5–15)
AST: 19 U/L (ref 0–37)
BILIRUBIN TOTAL: 0.7 mg/dL (ref 0.3–1.2)
BUN: 22 mg/dL (ref 6–23)
CHLORIDE: 98 meq/L (ref 96–112)
CO2: 21 mEq/L (ref 19–32)
Calcium: 9.6 mg/dL (ref 8.4–10.5)
Creatinine, Ser: 1.47 mg/dL — ABNORMAL HIGH (ref 0.50–1.35)
GFR calc Af Amer: 48 mL/min — ABNORMAL LOW (ref >90)
GFR calc non Af Amer: 41 mL/min — ABNORMAL LOW (ref >90)
Glucose, Bld: 215 mg/dL — ABNORMAL HIGH (ref 70–99)
POTASSIUM: 4.9 meq/L (ref 3.7–5.3)
Sodium: 135 mEq/L — ABNORMAL LOW (ref 137–147)
TOTAL PROTEIN: 7.4 g/dL (ref 6.0–8.3)

## 2014-01-03 LAB — CBC WITH DIFFERENTIAL/PLATELET
BASOS ABS: 0 10*3/uL (ref 0.0–0.1)
BASOS PCT: 0 % (ref 0–1)
EOS ABS: 0.1 10*3/uL (ref 0.0–0.7)
Eosinophils Relative: 1 % (ref 0–5)
HCT: 39.9 % (ref 39.0–52.0)
HEMOGLOBIN: 12.8 g/dL — AB (ref 13.0–17.0)
Lymphocytes Relative: 7 % — ABNORMAL LOW (ref 12–46)
Lymphs Abs: 0.8 10*3/uL (ref 0.7–4.0)
MCH: 27.1 pg (ref 26.0–34.0)
MCHC: 32.1 g/dL (ref 30.0–36.0)
MCV: 84.4 fL (ref 78.0–100.0)
MONOS PCT: 6 % (ref 3–12)
Monocytes Absolute: 0.6 10*3/uL (ref 0.1–1.0)
NEUTROS PCT: 86 % — AB (ref 43–77)
Neutro Abs: 9.1 10*3/uL — ABNORMAL HIGH (ref 1.7–7.7)
Platelets: 298 10*3/uL (ref 150–400)
RBC: 4.73 MIL/uL (ref 4.22–5.81)
RDW: 16.3 % — AB (ref 11.5–15.5)
WBC: 10.6 10*3/uL — ABNORMAL HIGH (ref 4.0–10.5)

## 2014-01-03 LAB — PROTIME-INR
INR: 2.96 — AB (ref 0.00–1.49)
PROTHROMBIN TIME: 31 s — AB (ref 11.6–15.2)

## 2014-01-03 LAB — TROPONIN I: Troponin I: <0.3 ng/mL (ref <0.30)

## 2014-01-03 LAB — CBG MONITORING, ED: Glucose-Capillary: 209 mg/dL — ABNORMAL HIGH (ref 70–99)

## 2014-01-03 LAB — PRO B NATRIURETIC PEPTIDE: Pro B Natriuretic peptide (BNP): 1521 pg/mL — ABNORMAL HIGH (ref 0–450)

## 2014-01-03 LAB — I-STAT CG4 LACTIC ACID, ED: Lactic Acid, Venous: 2.31 mmol/L — ABNORMAL HIGH (ref 0.5–2.2)

## 2014-01-03 MED ORDER — ACETAMINOPHEN 650 MG RE SUPP
650.0000 mg | Freq: Once | RECTAL | Status: AC
  Administered 2014-01-03: 650 mg via RECTAL
  Filled 2014-01-03: qty 1

## 2014-01-03 MED ORDER — SODIUM CHLORIDE 0.9 % IV BOLUS (SEPSIS)
250.0000 mL | Freq: Once | INTRAVENOUS | Status: AC
  Administered 2014-01-03: 250 mL via INTRAVENOUS

## 2014-01-03 MED ORDER — DEXTROSE 5 % IV SOLN
2.0000 g | Freq: Once | INTRAVENOUS | Status: DC
  Administered 2014-01-04: 2 g via INTRAVENOUS
  Filled 2014-01-03: qty 2

## 2014-01-03 NOTE — ED Notes (Signed)
Per EMS: From home not acting right, previously had UTI, unknown if had antibiotic.  Caregiver states Pt is not talking as much as he usually does. Pupils unequal, left bigger than right.  No recent injuries or falls.  243cbg 99.3 F 156/80 110hr Lung sounds clear.

## 2014-01-03 NOTE — ED Provider Notes (Signed)
CSN: [id number]     Arrival date & time 01/03/14  2150 History   First MD Initiated Contact with Patient 01/03/14 2151     Chief Complaint  Patient presents with  . Altered Mental Status     (Consider location/radiation/quality/duration/timing/severity/associated sxs/prior Treatment) The history is provided by the EMS personnel. No language interpreter was used.  Randy [name] is an 80 y/o M with PMHx of MI, HTN, DM, HLD presenting to the ED with AMS - patient was brought in by EMS. Last seen normal unknown. Patient lives at home and has aide that comes to his home. As per EMS report, stated that aide called EMS due to patient not acting like his normal self. Patient unable to give much history. History limited to no one with patient and patient's mental state. ROS limited.  Level 5 caveat   Past Medical History  Diagnosis Date  . Diabetes mellitus without complication   . Anxiety   . MI (myocardial infarction)   . Hypertension   . Hyperlipidemia    Past Surgical History  Procedure Laterality Date  . Knee surgery    . Cholecystectomy    . Hemorroidectomy     History reviewed. No pertinent family history. History  Substance Use Topics  . Smoking status: Former Research scientist (life sciences)  . Smokeless tobacco: Never Used  . Alcohol Use: No    Review of Systems  Unable to perform ROS: Mental status change      Allergies  Review of patient's allergies indicates no known allergies.  Home Medications   Prior to Admission medications   Medication Sig Start Date End Date Taking? Authorizing Provider  antiseptic oral rinse (CPC / CETYLPYRIDINIUM CHLORIDE 0.05%) 0.05 % LIQD solution 7 mLs by Mouth Rinse route 2 (two) times daily. 12/22/13  Yes Robbie Lis, MD  acetaminophen-codeine (TYLENOL #3) 300-30 MG per tablet Take 1 tablet by mouth 2 (two) times daily as needed for moderate pain. 12/22/13   Robbie Lis, MD  atorvastatin (LIPITOR) 10 MG tablet Take 10 mg by mouth daily.    Historical  Provider, MD  busPIRone (BUSPAR) 15 MG tablet Take 1 tablet (15 mg total) by mouth 2 (two) times daily. 12/22/13   Robbie Lis, MD  carvedilol (COREG) 3.125 MG tablet Take 3.125 mg by mouth 2 (two) times daily with a meal.    Historical Provider, MD  clopidogrel (PLAVIX) 75 MG tablet Take 75 mg by mouth daily.    Historical Provider, MD  Diaper Rash Products (DESITIN EX) Apply 1 application topically as needed.    Historical Provider, MD  enoxaparin (LOVENOX) 80 MG/0.8ML injection Inject 0.75 mLs (75 mg total) into the skin every 12 (twelve) hours. 12/22/13   Robbie Lis, MD  feeding supplement, ENSURE COMPLETE, (ENSURE COMPLETE) LIQD Take 237 mLs by mouth 2 (two) times daily between meals. 12/22/13   Robbie Lis, MD  insulin NPH Human (HUMULIN N,NOVOLIN N) 100 UNIT/ML injection Inject 14 Units into the skin 2 (two) times daily before a meal.    Historical Provider, MD  ranitidine (ZANTAC) 150 MG capsule Take 150 mg by mouth 2 (two) times daily.    Historical Provider, MD  Skin Protectants, Misc. (EUCERIN) cream Apply 1 application topically as needed for dry skin.    Historical Provider, MD  tamsulosin (FLOMAX) 0.4 MG CAPS capsule Take 1 capsule (0.4 mg total) by mouth daily. 12/22/13   Robbie Lis, MD  warfarin (COUMADIN) 7.5 MG tablet Take 1 tablet (  7.5 mg total) by mouth one time only at 6 PM. 12/22/13   Robbie Lis, MD   BP 103/52 mmHg  Pulse 91  Temp(Src) 104.2 F (40.1 C) (Rectal)  Resp 19  Ht 5\' 9"  (1.753 m)  Wt 170 lb (77.111 kg)  BMI 25.09 kg/m2  SpO2 97% Physical Exam  Constitutional: He appears well-developed and well-nourished. No distress.  HENT:  Head: Normocephalic and atraumatic.  Dry mucous membranes  Eyes:  Asymmetrical pupils-dilated in the left from previous surgery  Neck: Normal range of motion. Neck supple. No tracheal deviation present.  Negative neck stiffness Negative nuchal rigidity Negative cervical lymphadenopathy Negative meningeal signs   Cardiovascular: Regular rhythm and normal heart sounds.  Exam reveals no friction rub.   No murmur heard. Pulses:      Radial pulses are 2+ on the right side, and 2+ on the left side.       Dorsalis pedis pulses are 2+ on the right side, and 2+ on the left side.  Cap refill < 3 seconds  Pulmonary/Chest: Effort normal. No respiratory distress. He has no wheezes. He has rales. He exhibits no tenderness.  Rales noted to upper and lower lobes bilaterally  Negative stridor Negative use of accessory muscles  Abdominal: Soft. Bowel sounds are normal. He exhibits no distension. There is no tenderness. There is no rebound and no guarding.  Lymphadenopathy:    He has no cervical adenopathy.  Neurological: He is alert. No cranial nerve deficit. He exhibits normal muscle tone. Coordination normal.  Patient says "yes" when his name is called Patient does not follow commands Patient has a blank stare  Skin: Skin is warm and dry. No rash noted. He is not diaphoretic. No erythema.  Psychiatric: He has a normal mood and affect. His behavior is normal. Thought content normal.  Nursing note and vitals reviewed.   ED Course  Procedures (including critical care time)  Results for orders placed or performed during the hospital encounter of 01/03/14  CBC with Differential  Result Value Ref Range   WBC 10.6 (H) 4.0 - 10.5 K/uL   RBC 4.73 4.22 - 5.81 MIL/uL   Hemoglobin 12.8 (L) 13.0 - 17.0 g/dL   HCT 39.9 39.0 - 52.0 %   MCV 84.4 78.0 - 100.0 fL   MCH 27.1 26.0 - 34.0 pg   MCHC 32.1 30.0 - 36.0 g/dL   RDW 16.3 (H) 11.5 - 15.5 %   Platelets 298 150 - 400 K/uL   Neutrophils Relative % 86 (H) 43 - 77 %   Neutro Abs 9.1 (H) 1.7 - 7.7 K/uL   Lymphocytes Relative 7 (L) 12 - 46 %   Lymphs Abs 0.8 0.7 - 4.0 K/uL   Monocytes Relative 6 3 - 12 %   Monocytes Absolute 0.6 0.1 - 1.0 K/uL   Eosinophils Relative 1 0 - 5 %   Eosinophils Absolute 0.1 0.0 - 0.7 K/uL   Basophils Relative 0 0 - 1 %   Basophils  Absolute 0.0 0.0 - 0.1 K/uL  Comprehensive metabolic panel  Result Value Ref Range   Sodium 135 (L) 137 - 147 mEq/L   Potassium 4.9 3.7 - 5.3 mEq/L   Chloride 98 96 - 112 mEq/L   CO2 21 19 - 32 mEq/L   Glucose, Bld 215 (H) 70 - 99 mg/dL   BUN 22 6 - 23 mg/dL   Creatinine, Ser 1.47 (H) 0.50 - 1.35 mg/dL   Calcium 9.6 8.4 - 10.5 mg/dL  Total Protein 7.4 6.0 - 8.3 g/dL   Albumin 3.3 (L) 3.5 - 5.2 g/dL   AST 19 0 - 37 U/L   ALT 23 0 - 53 U/L   Alkaline Phosphatase 80 39 - 117 U/L   Total Bilirubin 0.7 0.3 - 1.2 mg/dL   GFR calc non Af Amer 41 (L) >90 mL/min   GFR calc Af Amer 48 (L) >90 mL/min   Anion gap 16 (H) 5 - 15  Troponin I  Result Value Ref Range   Troponin I <0.30 <0.30 ng/mL  Urinalysis, Routine w reflex microscopic  Result Value Ref Range   Color, Urine YELLOW YELLOW   APPearance TURBID (A) CLEAR   Specific Gravity, Urine 1.011 1.005 - 1.030   pH 5.5 5.0 - 8.0   Glucose, UA NEGATIVE NEGATIVE mg/dL   Hgb urine dipstick MODERATE (A) NEGATIVE   Bilirubin Urine NEGATIVE NEGATIVE   Ketones, ur 15 (A) NEGATIVE mg/dL   Protein, ur 30 (A) NEGATIVE mg/dL   Urobilinogen, UA 0.2 0.0 - 1.0 mg/dL   Nitrite POSITIVE (A) NEGATIVE   Leukocytes, UA LARGE (A) NEGATIVE  Protime-INR  Result Value Ref Range   Prothrombin Time 31.0 (H) 11.6 - 15.2 seconds   INR 2.96 (H) 0.00 - 1.49  Pro b natriuretic peptide (BNP)  Result Value Ref Range   Pro B Natriuretic peptide (BNP) 1521.0 (H) 0 - 450 pg/mL  Urine microscopic-add on  Result Value Ref Range   Squamous Epithelial / LPF RARE RARE   WBC, UA TOO NUMEROUS TO COUNT <3 WBC/hpf   RBC / HPF 11-20 <3 RBC/hpf   Bacteria, UA MANY (A) RARE  I-Stat CG4 Lactic Acid, ED  Result Value Ref Range   Lactic Acid, Venous 2.31 (H) 0.5 - 2.2 mmol/L  CBG monitoring, ED  Result Value Ref Range   Glucose-Capillary 209 (H) 70 - 99 mg/dL   Labs Review Labs Reviewed  CBC WITH DIFFERENTIAL - Abnormal; Notable for the following:    WBC 10.6 (*)     Hemoglobin 12.8 (*)    RDW 16.3 (*)    Neutrophils Relative % 86 (*)    Neutro Abs 9.1 (*)    Lymphocytes Relative 7 (*)    All other components within normal limits  COMPREHENSIVE METABOLIC PANEL - Abnormal; Notable for the following:    Sodium 135 (*)    Glucose, Bld 215 (*)    Creatinine, Ser 1.47 (*)    Albumin 3.3 (*)    GFR calc non Af Amer 41 (*)    GFR calc Af Amer 48 (*)    Anion gap 16 (*)    All other components within normal limits  URINALYSIS, ROUTINE W REFLEX MICROSCOPIC - Abnormal; Notable for the following:    APPearance TURBID (*)    Hgb urine dipstick MODERATE (*)    Ketones, ur 15 (*)    Protein, ur 30 (*)    Nitrite POSITIVE (*)    Leukocytes, UA LARGE (*)    All other components within normal limits  PROTIME-INR - Abnormal; Notable for the following:    Prothrombin Time 31.0 (*)    INR 2.96 (*)    All other components within normal limits  PRO B NATRIURETIC PEPTIDE - Abnormal; Notable for the following:    Pro B Natriuretic peptide (BNP) 1521.0 (*)    All other components within normal limits  URINE MICROSCOPIC-ADD ON - Abnormal; Notable for the following:    Bacteria, UA MANY (*)  All other components within normal limits  I-STAT CG4 LACTIC ACID, ED - Abnormal; Notable for the following:    Lactic Acid, Venous 2.31 (*)    All other components within normal limits  CBG MONITORING, ED - Abnormal; Notable for the following:    Glucose-Capillary 209 (*)    All other components within normal limits  CULTURE, BLOOD (ROUTINE X 2)  CULTURE, BLOOD (ROUTINE X 2)  URINE CULTURE  TROPONIN I  I-STAT CG4 LACTIC ACID, ED    Imaging Review Ct Head Wo Contrast  01/03/2014   CLINICAL DATA:  Altered mental status. Previous urinary tract infection. Pupils are unequal. No recent injury or fall.  EXAM: CT HEAD WITHOUT CONTRAST  TECHNIQUE: Contiguous axial images were obtained from the base of the skull through the vertex without intravenous contrast.  COMPARISON:   None.  FINDINGS: Diffuse cerebral atrophy. Mild ventricular dilatation consistent with central atrophy. Mild low attenuation change in the deep white matter likely due to small vessel ischemia. No mass effect or midline shift. No abnormal extra-axial fluid collections. Gray-white matter junctions are distinct. Basal cisterns are not effaced. No evidence of acute intracranial hemorrhage. No depressed skull fractures. Mild mucosal thickening in the paranasal sinuses. Mastoid air cells are not opacified.  IMPRESSION: No acute intracranial abnormalities. Chronic atrophy and small vessel ischemic changes.   Electronically Signed   By: Lucienne Capers M.D.   On: 01/03/2014 23:37   Dg Chest Port 1 View  01/03/2014   CLINICAL DATA:  Altered mental status, productive cough, fever, history diabetes, MI, hypertension, hyperlipidemia  EXAM: PORTABLE CHEST - 1 VIEW  COMPARISON:  Portable exam 2222 hr compared to 12/20/2013  FINDINGS: LEFT subclavian pacemaker leads project over RIGHT atrium and RIGHT ventricle.  Enlargement of cardiac silhouette.  Mediastinal contours and pulmonary vascularity normal.  Rotated to the RIGHT.  Lungs grossly clear.  No definite pleural effusion, pneumothorax or osseous abnormality.  IMPRESSION: Enlargement of cardiac silhouette post pacemaker.  No acute abnormalities.   Electronically Signed   By: Lavonia Dana M.D.   On: 01/03/2014 23:15     EKG Interpretation   Date/Time:  Wednesday January 03 2014 22:06:08 EST Ventricular Rate:  101 PR Interval:    QRS Duration: 96 QT Interval:  351 QTC Calculation: 455 R Axis:   -57 Text Interpretation:  Ventricular-paced complexes No further rhythm  analysis attempted due to paced rhythm Left axis deviation Borderline  repolarization abnormality Borderline prolonged QT interval Confirmed by  HARRISON  MD, FORREST ([id number]) on 01/03/2014 10:12:18 PM       12:18 AM This provider spoke with Dr. Blaine Hamper, [facility] - discussed case, labs,  imaging, vitals, ED course in great detail. Patient to be admitted for urosepsis. Patient to be admitted to [facility].   MDM   Final diagnoses:  Altered mental state  UTI (lower urinary tract infection)  Sepsis, due to unspecified organism    Medications  cefTRIAXone (ROCEPHIN) 1 g in dextrose 5 % 50 mL IVPB (0 g Intravenous Stopped 01/04/14 0124)  acetaminophen (TYLENOL) suppository 650 mg (650 mg Rectal Given 01/03/14 2219)  sodium chloride 0.9 % bolus 250 mL (0 mLs Intravenous Stopped 01/04/14 0017)   Filed Vitals:   01/04/14 0030 01/04/14 0100 01/04/14 0115 01/04/14 0130  BP: 94/50   103/52  Pulse: 106 109 90 91  Temp:      TempSrc:      Resp:  19    Height:      Weight:  SpO2: 95% 98% 96% 97%   This provider reviewed the patient's chart. Patient was admitted on 12/19/2013 for UTI and AKI.  EKG noted ventricular paced complexes with heart rate of 101 bpm. Troponin negative elevation. PT/INR elevated, PT 31, INR 2.96. BNP mildly elevated at 1521.0. CBC noted elevated white blood cell count of 10.6. CMP noted mild hyponatremia of 135. Glucose is 2:15. Mildly elevated anion gap of 16.0 mg/L-doubt DKA, bicarbonate 21. Mild bump in creatinine 1.47, this has improved, patient was approximately 1.78 thirteen days ago. Lactic acid 2.31. Urinalysis noted moderate hemoglobin with positive nitrates and large leukocytes with white blood cell count to numerous to count with many bacteria. Urine culture pending. Chest x-ray noted enlargement of the cardiac silhouette post pacemaker-no acute abnormalities identified. CT head no acute intracranial abnormalities identified. Blood cultures pending.  Patient presenting to emergency department with altered mental status and urosepsis. Patient had temperature of 104.73F rectally upon arrival with tachycardia, tachypnea, mildly low blood pressure of 100/48. Patient given IV fluids and IV antibiotics. Careful with the fluids since patient sounds wet with  elevated BNP - negative pitting edema noted to the lower extremities bilaterally. Patient given Tylenol suppository. Temperature has decreased to 97.74F. Patient found to have urinary tract infection-appears to be recurrent, patient was admitted to the hospital for the same in early November 2015. Patient to be admitted to stepdown for urosepsis. Patient stable for transfer.  Jamse Mead, PA-C 01/04/14 0201  Pamella Pert, MD 01/04/14 1124

## 2014-01-03 NOTE — ED Notes (Signed)
From home not acting right, previously had UTI, unknown if had antibiotic.  Caregiver states Pt is not talking as much as he usually does. Pupils unequal, left bigger than right.  No recent injuries or falls.

## 2014-01-04 ENCOUNTER — Encounter (HOSPITAL_COMMUNITY): Payer: Self-pay | Admitting: Internal Medicine

## 2014-01-04 DIAGNOSIS — E119 Type 2 diabetes mellitus without complications: Secondary | ICD-10-CM | POA: Insufficient documentation

## 2014-01-04 DIAGNOSIS — A419 Sepsis, unspecified organism: Secondary | ICD-10-CM | POA: Diagnosis present

## 2014-01-04 DIAGNOSIS — N179 Acute kidney failure, unspecified: Secondary | ICD-10-CM | POA: Diagnosis present

## 2014-01-04 DIAGNOSIS — I129 Hypertensive chronic kidney disease with stage 1 through stage 4 chronic kidney disease, or unspecified chronic kidney disease: Secondary | ICD-10-CM | POA: Diagnosis present

## 2014-01-04 DIAGNOSIS — I10 Essential (primary) hypertension: Secondary | ICD-10-CM

## 2014-01-04 DIAGNOSIS — F419 Anxiety disorder, unspecified: Secondary | ICD-10-CM | POA: Diagnosis present

## 2014-01-04 DIAGNOSIS — I252 Old myocardial infarction: Secondary | ICD-10-CM | POA: Diagnosis not present

## 2014-01-04 DIAGNOSIS — N390 Urinary tract infection, site not specified: Secondary | ICD-10-CM

## 2014-01-04 DIAGNOSIS — K219 Gastro-esophageal reflux disease without esophagitis: Secondary | ICD-10-CM | POA: Diagnosis present

## 2014-01-04 DIAGNOSIS — N183 Chronic kidney disease, stage 3 (moderate): Secondary | ICD-10-CM | POA: Diagnosis present

## 2014-01-04 DIAGNOSIS — Z7901 Long term (current) use of anticoagulants: Secondary | ICD-10-CM | POA: Diagnosis not present

## 2014-01-04 DIAGNOSIS — B9620 Unspecified Escherichia coli [E. coli] as the cause of diseases classified elsewhere: Secondary | ICD-10-CM | POA: Diagnosis present

## 2014-01-04 DIAGNOSIS — Z87891 Personal history of nicotine dependence: Secondary | ICD-10-CM | POA: Diagnosis not present

## 2014-01-04 DIAGNOSIS — I2510 Atherosclerotic heart disease of native coronary artery without angina pectoris: Secondary | ICD-10-CM | POA: Diagnosis present

## 2014-01-04 DIAGNOSIS — R4182 Altered mental status, unspecified: Secondary | ICD-10-CM | POA: Diagnosis present

## 2014-01-04 DIAGNOSIS — G9340 Encephalopathy, unspecified: Secondary | ICD-10-CM | POA: Diagnosis present

## 2014-01-04 DIAGNOSIS — Z86718 Personal history of other venous thrombosis and embolism: Secondary | ICD-10-CM

## 2014-01-04 DIAGNOSIS — E785 Hyperlipidemia, unspecified: Secondary | ICD-10-CM | POA: Diagnosis present

## 2014-01-04 DIAGNOSIS — Z950 Presence of cardiac pacemaker: Secondary | ICD-10-CM | POA: Diagnosis not present

## 2014-01-04 DIAGNOSIS — I369 Nonrheumatic tricuspid valve disorder, unspecified: Secondary | ICD-10-CM

## 2014-01-04 DIAGNOSIS — Z794 Long term (current) use of insulin: Secondary | ICD-10-CM | POA: Diagnosis not present

## 2014-01-04 LAB — CBC
HEMATOCRIT: 37.2 % — AB (ref 39.0–52.0)
HEMOGLOBIN: 11.7 g/dL — AB (ref 13.0–17.0)
MCH: 26.7 pg (ref 26.0–34.0)
MCHC: 31.5 g/dL (ref 30.0–36.0)
MCV: 84.7 fL (ref 78.0–100.0)
Platelets: 326 10*3/uL (ref 150–400)
RBC: 4.39 MIL/uL (ref 4.22–5.81)
RDW: 16.5 % — ABNORMAL HIGH (ref 11.5–15.5)
WBC: 15.9 10*3/uL — ABNORMAL HIGH (ref 4.0–10.5)

## 2014-01-04 LAB — COMPREHENSIVE METABOLIC PANEL
ALT: 19 U/L (ref 0–53)
AST: 15 U/L (ref 0–37)
Albumin: 2.9 g/dL — ABNORMAL LOW (ref 3.5–5.2)
Alkaline Phosphatase: 68 U/L (ref 39–117)
Anion gap: 14 (ref 5–15)
BUN: 27 mg/dL — ABNORMAL HIGH (ref 6–23)
CALCIUM: 9.2 mg/dL (ref 8.4–10.5)
CO2: 24 mEq/L (ref 19–32)
Chloride: 103 mEq/L (ref 96–112)
Creatinine, Ser: 1.94 mg/dL — ABNORMAL HIGH (ref 0.50–1.35)
GFR calc non Af Amer: 29 mL/min — ABNORMAL LOW (ref >90)
GFR, EST AFRICAN AMERICAN: 34 mL/min — AB (ref >90)
GLUCOSE: 234 mg/dL — AB (ref 70–99)
Potassium: 4.8 mEq/L (ref 3.7–5.3)
Sodium: 141 mEq/L (ref 137–147)
TOTAL PROTEIN: 6.9 g/dL (ref 6.0–8.3)
Total Bilirubin: 0.6 mg/dL (ref 0.3–1.2)

## 2014-01-04 LAB — GLUCOSE, CAPILLARY
GLUCOSE-CAPILLARY: 168 mg/dL — AB (ref 70–99)
GLUCOSE-CAPILLARY: 166 mg/dL — AB (ref 70–99)
Glucose-Capillary: 233 mg/dL — ABNORMAL HIGH (ref 70–99)

## 2014-01-04 LAB — URINALYSIS, ROUTINE W REFLEX MICROSCOPIC
Bilirubin Urine: NEGATIVE
GLUCOSE, UA: NEGATIVE mg/dL
Ketones, ur: 15 mg/dL — AB
Nitrite: POSITIVE — AB
Protein, ur: 30 mg/dL — AB
SPECIFIC GRAVITY, URINE: 1.011 (ref 1.005–1.030)
Urobilinogen, UA: 0.2 mg/dL (ref 0.0–1.0)
pH: 5.5 (ref 5.0–8.0)

## 2014-01-04 LAB — TROPONIN I
Troponin I: <0.3 ng/mL (ref <0.30)
Troponin I: <0.3 ng/mL (ref <0.30)
Troponin I: <0.3 ng/mL (ref <0.30)

## 2014-01-04 LAB — LACTIC ACID, PLASMA: LACTIC ACID, VENOUS: 2.3 mmol/L — AB (ref 0.5–2.2)

## 2014-01-04 LAB — MRSA PCR SCREENING: MRSA BY PCR: POSITIVE — AB

## 2014-01-04 LAB — URINE MICROSCOPIC-ADD ON

## 2014-01-04 LAB — PROTIME-INR
INR: 2.88 — ABNORMAL HIGH (ref 0.00–1.49)
Prothrombin Time: 30.4 seconds — ABNORMAL HIGH (ref 11.6–15.2)

## 2014-01-04 MED ORDER — CETYLPYRIDINIUM CHLORIDE 0.05 % MT LIQD
7.0000 mL | Freq: Two times a day (BID) | OROMUCOSAL | Status: DC
  Administered 2014-01-04 – 2014-01-09 (×11): 7 mL via OROMUCOSAL

## 2014-01-04 MED ORDER — SODIUM CHLORIDE 0.9 % IV BOLUS (SEPSIS)
300.0000 mL | Freq: Once | INTRAVENOUS | Status: AC
Start: 2014-01-04 — End: 2014-01-04
  Administered 2014-01-04: 300 mL via INTRAVENOUS

## 2014-01-04 MED ORDER — INSULIN NPH (HUMAN) (ISOPHANE) 100 UNIT/ML ~~LOC~~ SUSP
10.0000 [IU] | Freq: Two times a day (BID) | SUBCUTANEOUS | Status: DC
  Administered 2014-01-04 – 2014-01-09 (×11): 10 [IU] via SUBCUTANEOUS
  Filled 2014-01-04: qty 10

## 2014-01-04 MED ORDER — CLOPIDOGREL BISULFATE 75 MG PO TABS
75.0000 mg | ORAL_TABLET | Freq: Every day | ORAL | Status: DC
  Administered 2014-01-04 – 2014-01-09 (×6): 75 mg via ORAL
  Filled 2014-01-04 (×6): qty 1

## 2014-01-04 MED ORDER — WARFARIN SODIUM 7.5 MG PO TABS
7.5000 mg | ORAL_TABLET | Freq: Every day | ORAL | Status: DC
  Administered 2014-01-04 – 2014-01-06 (×3): 7.5 mg via ORAL
  Filled 2014-01-04 (×4): qty 1

## 2014-01-04 MED ORDER — ENSURE COMPLETE PO LIQD
237.0000 mL | Freq: Two times a day (BID) | ORAL | Status: DC
  Administered 2014-01-04 – 2014-01-09 (×8): 237 mL via ORAL
  Administered 2014-01-09: 10:00:00 via ORAL

## 2014-01-04 MED ORDER — PERFLUTREN LIPID MICROSPHERE
1.0000 mL | INTRAVENOUS | Status: AC | PRN
  Administered 2014-01-04: 3 mL via INTRAVENOUS
  Filled 2014-01-04: qty 10

## 2014-01-04 MED ORDER — INSULIN ASPART 100 UNIT/ML ~~LOC~~ SOLN
0.0000 [IU] | Freq: Three times a day (TID) | SUBCUTANEOUS | Status: DC
  Administered 2014-01-04 – 2014-01-05 (×3): 3 [IU] via SUBCUTANEOUS
  Administered 2014-01-05: 1 [IU] via SUBCUTANEOUS
  Administered 2014-01-05: 2 [IU] via SUBCUTANEOUS
  Administered 2014-01-06 – 2014-01-07 (×3): 1 [IU] via SUBCUTANEOUS
  Administered 2014-01-07 – 2014-01-08 (×2): 2 [IU] via SUBCUTANEOUS
  Administered 2014-01-08 – 2014-01-09 (×3): 1 [IU] via SUBCUTANEOUS
  Administered 2014-01-09: 3 [IU] via SUBCUTANEOUS

## 2014-01-04 MED ORDER — SODIUM CHLORIDE 0.9 % IV SOLN
250.0000 mg | Freq: Four times a day (QID) | INTRAVENOUS | Status: DC
  Administered 2014-01-04 – 2014-01-09 (×19): 250 mg via INTRAVENOUS
  Filled 2014-01-04 (×29): qty 250

## 2014-01-04 MED ORDER — ACETAMINOPHEN 650 MG RE SUPP
650.0000 mg | Freq: Three times a day (TID) | RECTAL | Status: DC | PRN
  Administered 2014-01-04 – 2014-01-06 (×2): 650 mg via RECTAL
  Filled 2014-01-04 (×2): qty 1

## 2014-01-04 MED ORDER — WARFARIN - PHARMACIST DOSING INPATIENT
Freq: Every day | Status: DC
  Administered 2014-01-04 – 2014-01-07 (×4)

## 2014-01-04 MED ORDER — CEFTRIAXONE SODIUM 1 G IJ SOLR
1.0000 g | INTRAMUSCULAR | Status: DC
  Administered 2014-01-04: 1 g via INTRAVENOUS
  Filled 2014-01-04 (×2): qty 10

## 2014-01-04 MED ORDER — ATORVASTATIN CALCIUM 10 MG PO TABS
10.0000 mg | ORAL_TABLET | Freq: Every day | ORAL | Status: DC
  Administered 2014-01-04 – 2014-01-09 (×6): 10 mg via ORAL
  Filled 2014-01-04 (×6): qty 1

## 2014-01-04 MED ORDER — BUSPIRONE HCL 15 MG PO TABS
15.0000 mg | ORAL_TABLET | Freq: Two times a day (BID) | ORAL | Status: DC
  Administered 2014-01-04 – 2014-01-09 (×11): 15 mg via ORAL
  Filled 2014-01-04 (×12): qty 1

## 2014-01-04 MED ORDER — SODIUM CHLORIDE 0.9 % IV SOLN
INTRAVENOUS | Status: DC
  Administered 2014-01-04 – 2014-01-05 (×3): via INTRAVENOUS
  Administered 2014-01-06: 1000 mL via INTRAVENOUS
  Administered 2014-01-06: 05:00:00 via INTRAVENOUS

## 2014-01-04 MED ORDER — FAMOTIDINE 20 MG PO TABS
20.0000 mg | ORAL_TABLET | Freq: Two times a day (BID) | ORAL | Status: DC
  Administered 2014-01-04 – 2014-01-05 (×3): 20 mg via ORAL
  Filled 2014-01-04 (×4): qty 1

## 2014-01-04 MED ORDER — ZINC OXIDE 40 % EX OINT: TOPICAL_OINTMENT | CUTANEOUS | Status: DC | PRN

## 2014-01-04 NOTE — Progress Notes (Signed)
[facility]  Patient Status: Active (receiving services up to time of hospitalization)  [facility] is providing the following services: RN, PT and OT  If patient discharges after hours, please call [phone].   Randy [name] 01/04/2014, 5:05 PM

## 2014-01-04 NOTE — Progress Notes (Signed)
ANTICOAGULATION CONSULT NOTE - Initial Consult  Pharmacy Consult for coumadin  Indication: hx DVT  No Known Allergies  Patient Measurements: Height: 5\' 7"  (170.2 cm) Weight: 203 lb 0.7 oz (92.1 kg) IBW/kg (Calculated) : 66.1 Heparin Dosing Weight:   Vital Signs: Temp: 97.3 F (36.3 C) (11/19 0210) Temp Source: Oral (11/19 0210) BP: 94/54 mmHg (11/19 0143) Pulse Rate: 89 (11/19 0143)  Labs:  Recent Labs  01/03/14 2220  HGB 12.8*  HCT 39.9  PLT 298  LABPROT 31.0*  INR 2.96*  CREATININE 1.47*  TROPONINI <0.30    Estimated Creatinine Clearance: 38.3 mL/min (by C-G formula based on Cr of 1.47).   Medical History: Past Medical History  Diagnosis Date  . Diabetes mellitus without complication   . Anxiety   . MI (myocardial infarction)   . Hypertension   . Hyperlipidemia     Medications:  Prescriptions prior to admission  Medication Sig Dispense Refill Last Dose  . antiseptic oral rinse (CPC / CETYLPYRIDINIUM CHLORIDE 0.05%) 0.05 % LIQD solution 7 mLs by Mouth Rinse route 2 (two) times daily. 44 mL 0 01/04/2014 at Unknown time  . acetaminophen-codeine (TYLENOL #3) 300-30 MG per tablet Take 1 tablet by mouth 2 (two) times daily as needed for moderate pain. 30 tablet 0   . atorvastatin (LIPITOR) 10 MG tablet Take 10 mg by mouth daily.   12/19/2013 at Unknown time  . busPIRone (BUSPAR) 15 MG tablet Take 1 tablet (15 mg total) by mouth 2 (two) times daily. 60 tablet 0   . carvedilol (COREG) 3.125 MG tablet Take 3.125 mg by mouth 2 (two) times daily with a meal.   12/19/2013 at Unknown time  . clopidogrel (PLAVIX) 75 MG tablet Take 75 mg by mouth daily.   12/19/2013 at Unknown time  . Diaper Rash Products (DESITIN EX) Apply 1 application topically as needed.   Past Week at Unknown time  . enoxaparin (LOVENOX) 80 MG/0.8ML injection Inject 0.75 mLs (75 mg total) into the skin every 12 (twelve) hours. 22 Syringe 0   . feeding supplement, ENSURE COMPLETE, (ENSURE COMPLETE) LIQD  Take 237 mLs by mouth 2 (two) times daily between meals. 237 mL 0   . insulin NPH Human (HUMULIN N,NOVOLIN N) 100 UNIT/ML injection Inject 14 Units into the skin 2 (two) times daily before a meal.   12/19/2013 at Unknown time  . ranitidine (ZANTAC) 150 MG capsule Take 150 mg by mouth 2 (two) times daily.   12/19/2013 at Unknown time  . Skin Protectants, Misc. (EUCERIN) cream Apply 1 application topically as needed for dry skin.   Past Week at Unknown time  . tamsulosin (FLOMAX) 0.4 MG CAPS capsule Take 1 capsule (0.4 mg total) by mouth daily. 30 capsule 0   . warfarin (COUMADIN) 7.5 MG tablet Take 1 tablet (7.5 mg total) by mouth one time only at 6 PM. 20 tablet 0     Assessment: 80 yo with hx of Hyperlipidemia  Hypertension  Anxiety  History of MI (myocardial infarction)  UTI (lower urinary tract infection)  Essential hypertension  Weakness generalized  History of DVT (deep vein thrombosis)  Diabetes mellitus  Sepsis Coumadin to continue. Current INR is 2.96.  Goal of Therapy:  INR 2-3 Monitor platelets by anticoagulation protocol: Yes   Plan:  Continue home coumadin dose of 7.5 mg daily.  Daily. INR   Curlene Dolphin 01/04/2014,4:03 AM

## 2014-01-04 NOTE — Progress Notes (Signed)
PATIENT DETAILS Name: Randy [name] Age: 80 y.o. Sex: male Date of Birth: 1924/12/27 Admit Date: 01/03/2014 Admitting Physician Ivor Costa, MD PCP:Pcp Not In System  Subjective: Much better, able to answer most of my questions appropriately  Assessment/Plan: Principal Problem:   Acute encephalopathy:likely secondary to UTI. Improved with Abx.CT head negative for significant intracranial abnormalities.  Active Problems: Sepsis:secondary to UTI. Much better with IVF, IV Abx. Await Urine and Blood cs.  UTI: Febrile on admission, recent urine culture Escherichia coli sensitive to Rocephin. Continue Rocephin. Await urine culture. Currently stable and without any nontoxic appearance.  Acute renal failure: Likely prerenal secondary to sepsis. Start IV fluids, follow renal function.  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 Pharmacy managing Coumadin.  History of CAD: No chest pain. Continue Plavix, Lipitor. Resume beta blocker when blood pressure more stable.  Hyperlipidemia: continue statin  Hypertension: BP currently soft, holding all antihypertensives for now. Resume when able  Diabetes: CBGs stable, continue NPH, as his eye. Follow CBGs and titrate accordingly. Recent A1c on 11/47.7.  Anxiety: Continue BuSpar.  Deconditioning/generalized weakness: Secondary to sepsis, recent illness.PT evaluation.  Disposition: Remain inpatient  Antibiotics:  Rocephin 11/18>>   Anti-infectives    Start     Dose/Rate Route Frequency Ordered Stop   01/04/14 0100  cefTRIAXone (ROCEPHIN) 1 g in dextrose 5 % 50 mL IVPB     1 g100 mL/hr over 30 Minutes Intravenous Every 24 hours 01/04/14 0019     01/04/14 0000  cefTRIAXone (ROCEPHIN) 2 g in dextrose 5 % 50 mL IVPB  Status:  Discontinued     2 g100 mL/hr over 30 Minutes Intravenous  Once 01/03/14 2353 01/04/14 0022      DVT Prophylaxis: On coumadin  Code Status: Full code   Family Communication None at  bedside  Procedures:  None  CONSULTS:  None  Time spent 40 minutes-which includes 50% of the time with face-to-face with patient/ family and coordinating care related to the above assessment and plan.  MEDICATIONS: Scheduled Meds: . antiseptic oral rinse  7 mL Mouth Rinse BID  . atorvastatin  10 mg Oral Daily  . busPIRone  15 mg Oral BID  . cefTRIAXone (ROCEPHIN)  IV  1 g Intravenous Q24H  . clopidogrel  75 mg Oral Daily  . famotidine  20 mg Oral BID  . feeding supplement (ENSURE COMPLETE)  237 mL Oral BID BM  . insulin aspart  0-9 Units Subcutaneous TID WC  . insulin NPH Human  10 Units Subcutaneous BID  . warfarin  7.5 mg Oral q1800  . Warfarin - Pharmacist Dosing Inpatient   Does not apply q1800   Continuous Infusions: . sodium chloride 100 mL/hr at 01/04/14 0840   PRN Meds:.acetaminophen, liver oil-zinc oxide    PHYSICAL EXAM: Vital signs in last 24 hours: Filed Vitals:   01/04/14 0210 01/04/14 0300 01/04/14 0345 01/04/14 0500  BP:  107/42 91/50   Pulse:  70 70   Temp: 97.3 F (36.3 C)  97.6 F (36.4 C)   TempSrc: Oral  Oral   Resp:  19 19   Height: 5\' 7"  (1.702 m)     Weight: 92.1 kg (203 lb 0.7 oz)   92.1 kg (203 lb 0.7 oz)  SpO2:  98% 98%     Weight change:  Filed Weights   01/03/14 2155 01/04/14 0210 01/04/14 0500  Weight: 77.111 kg (170 lb) 92.1 kg (203 lb 0.7 oz) 92.1 kg (  203 lb 0.7 oz)   Body mass index is 31.79 kg/(m^2).   Gen Exam: Awake and alert with clear speech.   Neck: Supple, No JVD.   Chest: B/L Clear.   CVS: S1 S2 Regular, no murmurs.  Abdomen: soft, BS +, non tender, non distended.  Extremities: no edema, lower extremities warm to touch. Neurologic: Non Focal.   Skin: No Rash.   Wounds: N/A.   Intake/Output from previous day: No intake or output data in the 24 hours ending 01/04/14 0916   LAB RESULTS: CBC  Recent Labs Lab 01/03/14 2220 01/04/14 0550  WBC 10.6* 15.9*  HGB 12.8* 11.7*  HCT 39.9 37.2*  PLT 298 326   MCV 84.4 84.7  MCH 27.1 26.7  MCHC 32.1 31.5  RDW 16.3* 16.5*  LYMPHSABS 0.8  --   MONOABS 0.6  --   EOSABS 0.1  --   BASOSABS 0.0  --     Chemistries   Recent Labs Lab 01/03/14 2220 01/04/14 0550  NA 135* 141  K 4.9 4.8  CL 98 103  CO2 21 24  GLUCOSE 215* 234*  BUN 22 27*  CREATININE 1.47* 1.94*  CALCIUM 9.6 9.2    CBG:  Recent Labs Lab 01/03/14 2212  GLUCAP 209*    GFR Estimated Creatinine Clearance: 29 mL/min (by C-G formula based on Cr of 1.94).  Coagulation profile  Recent Labs Lab 01/03/14 2220 01/04/14 0550  INR 2.96* 2.88*    Cardiac Enzymes  Recent Labs Lab 01/03/14 2220 01/04/14 0550  TROPONINI <0.30 <0.30    Invalid input(s): POCBNP No results for input(s): DDIMER in the last 72 hours. No results for input(s): HGBA1C in the last 72 hours. No results for input(s): CHOL, HDL, LDLCALC, TRIG, CHOLHDL, LDLDIRECT in the last 72 hours. No results for input(s): TSH, T4TOTAL, T3FREE, THYROIDAB in the last 72 hours.  Invalid input(s): FREET3 No results for input(s): VITAMINB12, FOLATE, FERRITIN, TIBC, IRON, RETICCTPCT in the last 72 hours. No results for input(s): LIPASE, AMYLASE in the last 72 hours.  Urine Studies No results for input(s): UHGB, CRYS in the last 72 hours.  Invalid input(s): UACOL, UAPR, USPG, UPH, UTP, UGL, UKET, UBIL, UNIT, UROB, ULEU, UEPI, UWBC, URBC, UBAC, CAST, UCOM, BILUA  MICROBIOLOGY: Recent Results (from the past 240 hour(s))  MRSA PCR Screening     Status: Abnormal   Collection Time: 01/04/14  2:15 AM  Result Value Ref Range Status   MRSA by PCR POSITIVE (A) NEGATIVE Final    Comment:        The GeneXpert MRSA Assay (FDA approved for NASAL specimens only), is one component of a comprehensive MRSA colonization surveillance program. It is not intended to diagnose MRSA infection nor to guide or monitor treatment for MRSA infections. RESULT CALLED TO, READ BACK BY AND VERIFIED WITH: PROPST,N RN [id number] AT  [phone] SKEEN,P     RADIOLOGY STUDIES/RESULTS: Dg Chest 2 View  12/20/2013   CLINICAL DATA:  Weakness generalized.  UTI.  Fever.  EXAM: CHEST  2 VIEW  COMPARISON:  None.  FINDINGS: Left dual lead cardiac pacemaker. Heart size is upper limits of normal. Lungs are clear without airspace disease or pulmonary edema. Degenerative changes in the thoracic spine. No definite pleural effusions.  IMPRESSION: No acute chest abnormality.   Electronically Signed   By: Markus Daft M.D.   On: 12/20/2013 01:11   Ct Head Wo Contrast  01/03/2014   CLINICAL DATA:  Altered mental status. Previous urinary tract infection. Pupils  are unequal. No recent injury or fall.  EXAM: CT HEAD WITHOUT CONTRAST  TECHNIQUE: Contiguous axial images were obtained from the base of the skull through the vertex without intravenous contrast.  COMPARISON:  None.  FINDINGS: Diffuse cerebral atrophy. Mild ventricular dilatation consistent with central atrophy. Mild low attenuation change in the deep white matter likely due to small vessel ischemia. No mass effect or midline shift. No abnormal extra-axial fluid collections. Gray-white matter junctions are distinct. Basal cisterns are not effaced. No evidence of acute intracranial hemorrhage. No depressed skull fractures. Mild mucosal thickening in the paranasal sinuses. Mastoid air cells are not opacified.  IMPRESSION: No acute intracranial abnormalities. Chronic atrophy and small vessel ischemic changes.   Electronically Signed   By: Lucienne Capers M.D.   On: 01/03/2014 23:37   US Renal  12/20/2013   CLINICAL DATA:  Acute kidney injury. History of diabetes, hypertension, hyperlipidemia. Incontinence.  EXAM: RENAL/URINARY TRACT ULTRASOUND COMPLETE  COMPARISON:  None.  FINDINGS: Right Kidney:  Length: 11.7 cm. Echogenicity is normal. Large upper pole cyst is 10.8 x 8.1 x 10.4 cm. No hydronephrosis.  Left Kidney:  Length: 11.0 cm. Echogenicity within normal limits. No mass or hydronephrosis  visualized.  Bladder:  Within the posterior aspect of the bladder there is echogenic debris. Prevoid volume is calculated to be 196 cc. Postvoid residual is 107 cc.  IMPRESSION: 1. Large right upper pole cyst, 10.8 cm. 2. No hydronephrosis. 3. Significant postvoid residual calculated to be 107 cc. 4. Debris within the bladder.   Electronically Signed   By: Shon Hale M.D.   On: 12/20/2013 20:39   Dg Chest Port 1 View  01/03/2014   CLINICAL DATA:  Altered mental status, productive cough, fever, history diabetes, MI, hypertension, hyperlipidemia  EXAM: PORTABLE CHEST - 1 VIEW  COMPARISON:  Portable exam 2222 hr compared to 12/20/2013  FINDINGS: LEFT subclavian pacemaker leads project over RIGHT atrium and RIGHT ventricle.  Enlargement of cardiac silhouette.  Mediastinal contours and pulmonary vascularity normal.  Rotated to the RIGHT.  Lungs grossly clear.  No definite pleural effusion, pneumothorax or osseous abnormality.  IMPRESSION: Enlargement of cardiac silhouette post pacemaker.  No acute abnormalities.   Electronically Signed   By: Lavonia Dana M.D.   On: 01/03/2014 23:15    Oren Binet, MD  [facility] Pager:[phone]  If 7PM-7AM, please contact night-coverage [email or web address] Password [email or web address] 01/04/2014, 9:16 AM   LOS: 1 day

## 2014-01-04 NOTE — H&P (Signed)
[facility] History and Physical  Andrea Colglazier YBW:[id number] DOB: 1924/03/02 DOA: 01/03/2014  Referring physician: ED physician PCP: Pcp Not In System  Specialists:   Chief Complaint: AMS and increasing urinary frequency.  HPI: Randy [name] is a 80 y.o. male with PMHx of MI, HTN, DM, HLD presenting to the ED with AMS.   Patient was brought in by EMS and he dose not know what happened to him. The history is limited. Patient lives at home and has aide that comes to his home. As per EMS report, stated that aide called EMS due to patient not acting like his normal self. When I evaluated patient on the floor, he is oriented x3. He does not have any symptoms except for increased urinary frequency. Patient does not have fever, chills, chest pain, shortness of breath, nausea, vomiting, diarrhea, abdominal pain.   Work up in the ED demonstrates positive UA for UTI, negative CT-head for acute abnormalities, negative chest x-ray for acute abnormalities. Pt is admitted to inpatient for further evaluation and treatment.  Review of Systems: As presented in the history of presenting illness, rest negative.  Where does patient live? Lives at home with aide.   Can patient participate in ADLs? barely  Allergy: No Known Allergies  Past Medical History  Diagnosis Date  . Diabetes mellitus without complication   . Anxiety   . MI (myocardial infarction)   . Hypertension   . Hyperlipidemia     Past Surgical History  Procedure Laterality Date  . Knee surgery    . Cholecystectomy    . Hemorroidectomy      Social History:  reports that he has quit smoking. He has never used smokeless tobacco. He reports that he does not drink alcohol or use illicit drugs.  Family History:  Family History  Problem Relation Age of Onset  . Heart attack Mother   . Heart attack Father      Prior to Admission medications   Medication Sig Start Date End Date Taking? Authorizing Provider   acetaminophen-codeine (TYLENOL #3) 300-30 MG per tablet Take 1 tablet by mouth 2 (two) times daily as needed for moderate pain. 12/22/13   Robbie Lis, MD  antiseptic oral rinse (CPC / CETYLPYRIDINIUM CHLORIDE 0.05%) 0.05 % LIQD solution 7 mLs by Mouth Rinse route 2 (two) times daily. 12/22/13   Robbie Lis, MD  atorvastatin (LIPITOR) 10 MG tablet Take 10 mg by mouth daily.    Historical Provider, MD  busPIRone (BUSPAR) 15 MG tablet Take 1 tablet (15 mg total) by mouth 2 (two) times daily. 12/22/13   Robbie Lis, MD  carvedilol (COREG) 3.125 MG tablet Take 3.125 mg by mouth 2 (two) times daily with a meal.    Historical Provider, MD  clopidogrel (PLAVIX) 75 MG tablet Take 75 mg by mouth daily.    Historical Provider, MD  Diaper Rash Products (DESITIN EX) Apply 1 application topically as needed.    Historical Provider, MD  enoxaparin (LOVENOX) 80 MG/0.8ML injection Inject 0.75 mLs (75 mg total) into the skin every 12 (twelve) hours. 12/22/13   Robbie Lis, MD  feeding supplement, ENSURE COMPLETE, (ENSURE COMPLETE) LIQD Take 237 mLs by mouth 2 (two) times daily between meals. 12/22/13   Robbie Lis, MD  insulin NPH Human (HUMULIN N,NOVOLIN N) 100 UNIT/ML injection Inject 14 Units into the skin 2 (two) times daily before a meal.    Historical Provider, MD  ranitidine (ZANTAC) 150 MG capsule Take 150 mg by  mouth 2 (two) times daily.    Historical Provider, MD  Skin Protectants, Misc. (EUCERIN) cream Apply 1 application topically as needed for dry skin.    Historical Provider, MD  tamsulosin (FLOMAX) 0.4 MG CAPS capsule Take 1 capsule (0.4 mg total) by mouth daily. 12/22/13   Robbie Lis, MD  warfarin (COUMADIN) 7.5 MG tablet Take 1 tablet (7.5 mg total) by mouth one time only at 6 PM. 12/22/13   Robbie Lis, MD    Physical Exam: Filed Vitals:   01/04/14 0130 01/04/14 0143 01/04/14 0147 01/04/14 0210  BP: 103/52 94/54    Pulse: 91 89    Temp:   97.5 F (36.4 C) 97.3 F (36.3 C)  TempSrc:    Oral Oral  Resp:  24    Height:    5\' 7"  (1.702 m)  Weight:    92.1 kg (203 lb 0.7 oz)  SpO2: 97% 95%     General: Not in acute distress HEENT:       Eyes: PERRL, EOMI, no scleral icterus       ENT: No discharge from the ears and nose, no pharynx injection, no tonsillar enlargement.        Neck: No JVD, no bruit, no mass felt. Cardiac: S1/S2, RRR, No murmurs, No gallops or rubs Pulm: Good air movement bilaterally. Clear to auscultation bilaterally. No rales, wheezing, rhonchi or rubs. Abd: Soft, nondistended, nontender, no rebound pain, no organomegaly, BS present Ext: No edema bilaterally. 2+DP/PT pulse bilaterally Musculoskeletal: No joint deformities, erythema, or stiffness, ROM full Skin: No rashes.  Neuro: Alert and oriented X3, cranial nerves II-XII grossly intact, muscle strength 5/5 in all extremeties, sensation to light touch intact. Brachial reflex 2+ bilaterally. Knee reflex 1+ bilaterally. Negative Babinski's sign. Normal finger to nose test. Psych: Patient is not psychotic, no suicidal or hemocidal ideation.  Labs on Admission:  Basic Metabolic Panel:  Recent Labs Lab 01/03/14 2220  NA 135*  K 4.9  CL 98  CO2 21  GLUCOSE 215*  BUN 22  CREATININE 1.47*  CALCIUM 9.6   Liver Function Tests:  Recent Labs Lab 01/03/14 2220  AST 19  ALT 23  ALKPHOS 80  BILITOT 0.7  PROT 7.4  ALBUMIN 3.3*   No results for input(s): LIPASE, AMYLASE in the last 168 hours. No results for input(s): AMMONIA in the last 168 hours. CBC:  Recent Labs Lab 01/03/14 2220  WBC 10.6*  NEUTROABS 9.1*  HGB 12.8*  HCT 39.9  MCV 84.4  PLT 298   Cardiac Enzymes:  Recent Labs Lab 01/03/14 2220  TROPONINI <0.30    BNP (last 3 results)  Recent Labs  12/20/13 0128 01/03/14 2220  PROBNP 3532.0* 1521.0*   CBG:  Recent Labs Lab 01/03/14 2212  GLUCAP 209*    Radiological Exams on Admission: Ct Head Wo Contrast  01/03/2014   CLINICAL DATA:  Altered mental status.  Previous urinary tract infection. Pupils are unequal. No recent injury or fall.  EXAM: CT HEAD WITHOUT CONTRAST  TECHNIQUE: Contiguous axial images were obtained from the base of the skull through the vertex without intravenous contrast.  COMPARISON:  None.  FINDINGS: Diffuse cerebral atrophy. Mild ventricular dilatation consistent with central atrophy. Mild low attenuation change in the deep white matter likely due to small vessel ischemia. No mass effect or midline shift. No abnormal extra-axial fluid collections. Gray-white matter junctions are distinct. Basal cisterns are not effaced. No evidence of acute intracranial hemorrhage. No depressed skull fractures. Mild  mucosal thickening in the paranasal sinuses. Mastoid air cells are not opacified.  IMPRESSION: No acute intracranial abnormalities. Chronic atrophy and small vessel ischemic changes.   Electronically Signed   By: Lucienne Capers M.D.   On: 01/03/2014 23:37   Dg Chest Port 1 View  01/03/2014   CLINICAL DATA:  Altered mental status, productive cough, fever, history diabetes, MI, hypertension, hyperlipidemia  EXAM: PORTABLE CHEST - 1 VIEW  COMPARISON:  Portable exam 2222 hr compared to 12/20/2013  FINDINGS: LEFT subclavian pacemaker leads project over RIGHT atrium and RIGHT ventricle.  Enlargement of cardiac silhouette.  Mediastinal contours and pulmonary vascularity normal.  Rotated to the RIGHT.  Lungs grossly clear.  No definite pleural effusion, pneumothorax or osseous abnormality.  IMPRESSION: Enlargement of cardiac silhouette post pacemaker.  No acute abnormalities.   Electronically Signed   By: Lavonia Dana M.D.   On: 01/03/2014 23:15    EKG: Independently reviewed. Paced.   Assessment/Plan Principal Problem:   Acute encephalopathy Active Problems:   Hyperlipidemia   Hypertension   Anxiety   History of MI (myocardial infarction)   UTI (lower urinary tract infection)   Essential hypertension   Weakness generalized   History of  DVT (deep vein thrombosis)   Diabetes mellitus   Sepsis  Acute encephalopathy: Likely secondary to urinary tract infection and sepsis. CT head is negative. Chest x-ray is negative. Patient mental status improved to his baseline after IVF - will admit to SDU - treat UTI - neuro check q4h  Sepsis secondary to UTI: He has positive UA for UTI and urinary frequency. Currently patient is mildly septic. bp is soft. Lactic acid is 2.31 - Started Rocephin IV - bolus IVF as needed.  - will not give IVF continuously given his elevated ProBMP 1521 - blood culture x 2 - urine culture - will hold Tamsulosine and coreg given soft bp  Hx of MI: s/p stent placement per patient. On plavix at home. No chest pain on admission. - continue Plavix - Continue Lipitor - hold coreg  HTN:  - hold coreg  Hx of R leg DVT: On Coumadin, and INR 2.96, -Continue Coumadin per pharmacy  Elevated cre: cre is 1.47 on admission. This is likely his new baseline since his Cre was also elevated on previous admission at 1.5 - BMP in AM  GERD: pepcid  DVT ppx: SCD. On coumadin with INR 2.96  Code Status: Full code Family Communication: None at bed side.    Disposition Plan: Admit to inpatient   Date of Service 01/04/2014    Ivor Costa [facility] Pager [phone]  If 7PM-7AM, please contact night-coverage [email or web address] Password [email or web address] 01/04/2014, 2:47 AM

## 2014-01-04 NOTE — Progress Notes (Signed)
ANTIBIOTIC CONSULT NOTE - INITIAL  Pharmacy Consult for Primaxin Indication: bacteremia  No Known Allergies  Patient Measurements: Height: 5\' 7"  (170.2 cm) Weight: 203 lb 0.7 oz (92.1 kg) IBW/kg (Calculated) : 66.1 Adjusted Body Weight:   Vital Signs: Temp: 98.1 F (36.7 C) (11/19 1957) Temp Source: Oral (11/19 1957) BP: 104/45 mmHg (11/19 1957) Pulse Rate: 70 (11/19 1957) Intake/Output from previous day:   Intake/Output from this shift:    Labs:  Recent Labs  01/03/14 2220 01/04/14 0550  WBC 10.6* 15.9*  HGB 12.8* 11.7*  PLT 298 326  CREATININE 1.47* 1.94*    Medical History: Past Medical History  Diagnosis Date  . Diabetes mellitus without complication   . Anxiety   . MI (myocardial infarction)   . Hypertension   . Hyperlipidemia     Medications:  Prescriptions prior to admission  Medication Sig Dispense Refill Last Dose  . acetaminophen-codeine (TYLENOL #3) 300-30 MG per tablet Take 1 tablet by mouth 2 (two) times daily as needed for moderate pain. 30 tablet 0 unknown  . antiseptic oral rinse (CPC / CETYLPYRIDINIUM CHLORIDE 0.05%) 0.05 % LIQD solution 7 mLs by Mouth Rinse route 2 (two) times daily. 44 mL 0 01/04/2014 at Unknown time  . atorvastatin (LIPITOR) 10 MG tablet Take 10 mg by mouth daily.   unknown  . busPIRone (BUSPAR) 15 MG tablet Take 1 tablet (15 mg total) by mouth 2 (two) times daily. 60 tablet 0 unknown  . carvedilol (COREG) 3.125 MG tablet Take 3.125 mg by mouth 2 (two) times daily with a meal.   unknown  . clopidogrel (PLAVIX) 75 MG tablet Take 75 mg by mouth daily.   unknown  . Diaper Rash Products (DESITIN EX) Apply 1 application topically as needed.   unknown  . enoxaparin (LOVENOX) 80 MG/0.8ML injection Inject 0.75 mLs (75 mg total) into the skin every 12 (twelve) hours. 22 Syringe 0 unknown  . feeding supplement, ENSURE COMPLETE, (ENSURE COMPLETE) LIQD Take 237 mLs by mouth 2 (two) times daily between meals. 237 mL 0 unknown  .  insulin NPH Human (HUMULIN N,NOVOLIN N) 100 UNIT/ML injection Inject 14 Units into the skin 2 (two) times daily before a meal.   unknown  . ranitidine (ZANTAC) 150 MG capsule Take 150 mg by mouth 2 (two) times daily.   unknown  . Skin Protectants, Misc. (EUCERIN) cream Apply 1 application topically as needed for dry skin.   unknown  . tamsulosin (FLOMAX) 0.4 MG CAPS capsule Take 1 capsule (0.4 mg total) by mouth daily. 30 capsule 0 unknown  . warfarin (COUMADIN) 7.5 MG tablet Take 1 tablet (7.5 mg total) by mouth one time only at 6 PM. 20 tablet 0 unknown   Assessment: 80 yo male admitted with acute encephalopathy, concern for sepsis, pt with UTI and now has 2/2 blood cultures with GNR. Increased white count  5.6 > 10.6 > 15.9, LA 2.3, Tmax/24h 102.5.  Goal of Therapy:  Resolution of infection  Plan:  Primaxin 250 mg IV q6h Monitor renal fx, cultures, duration of therapy Narrow as appropriate   Hughes Better, PharmD, BCPS Clinical Pharmacist Pager: [phone] 01/04/2014 9:13 PM

## 2014-01-04 NOTE — Care Management Note (Signed)
    Page 1 of 1   01/04/2014     2:02:27 PM CARE MANAGEMENT NOTE 01/04/2014  Patient:  Randy [name], Randy [name]   Account Number:  [id number]  Date Initiated:  01/04/2014  Documentation initiated by:  Olubunmi Rothenberger  Subjective/Objective Assessment:   dx sepsis 2/2 UTI; lives with dtr who has MS, has w/c and hosp bed, active with [facility] for RN, PT, OT, 24 hr caregivers for dtr also provide care for pt     Anticipated DC Date:  01/09/2014   Anticipated DC Plan:  [place]  CM consult      Status of service:  In process, will continue to follow Medicare Important Message given?   (If response is "NO", the following Medicare IM given date fields will be blank) Date Medicare IM given:   Medicare IM given by:   Date Additional Medicare IM given:   Additional Medicare IM given by:    Per UR Regulation:  Reviewed for med. necessity/level of care/duration of stay

## 2014-01-04 NOTE — Progress Notes (Signed)
OT Cancellation Note  Patient Details Name: Randy [name] MRN: [id number] DOB: Dec 30, 1924   Cancelled Treatment:    Reason Eval/Treat Not Completed: Other (comment) Pt is Medicare and current D/C plan is SNF. No apparent immediate acute care OT needs, therefore will defer OT to SNF. If OT eval is needed please call Acute Rehab Dept. at [phone] or text page OT at [phone].  [place], OTR/L  [phone] 01/04/2014 01/04/2014, 4:55 PM

## 2014-01-04 NOTE — Evaluation (Signed)
Physical Therapy Evaluation Patient Details Name: Randy [name] MRN: [id number] DOB: 1924-04-09 Today's Date: 01/04/2014   History of Present Illness  Pt was admitted for AMS and  for acute UTI.  He has a h/o Pacemaker, CAD and MI  Clinical Impression  Patient demonstrates deficits in functional mobility as indicated below. Will need continued skilled PT to address deficits and maximize function. Patient extremely lethargic, limited evaluation.  Attempted repositioning at EOB to facilitate arousal but patient did not respond significantly.  Nsg aware, patient too lethargic for OOB at this time. Total assist for mobility. Will continue to assess.     Follow Up Recommendations SNF    Equipment Recommendations  None recommended by PT    Recommendations for Other Services       Precautions / Restrictions Precautions Precautions: Fall Restrictions Weight Bearing Restrictions: No      Mobility  Bed Mobility Overal bed mobility: Needs Assistance Bed Mobility: Supine to Sit     Supine to sit: Total assist     General bed mobility comments: assist for LEs and trunk.  Attempts to arouse patient by repositioning and sitting EOB unsuccessful  Transfers                    Ambulation/Gait                Stairs            Wheelchair Mobility    Modified Rankin (Stroke Patients Only)       Balance   Sitting-balance support: Feet supported Sitting balance-Leahy Scale: Poor Sitting balance - Comments: sitting EOB patient was able to maintian positioning with min assist but still non-arousable                                     Pertinent Vitals/Pain Pain Assessment: Faces Faces Pain Scale: No hurt    Home Living Family/patient expects to be discharged to:: Private residence Living Arrangements: Children Available Help at Discharge: Family;Personal care attendant;Available PRN/intermittently Type of Home: House Home Access: Ramped  entrance     Home Layout: One level Home Equipment: Walker - 2 wheels;Shower seat;Grab bars - tub/shower Additional Comments: ALL OF THIS HOME LIVING WAS TAKEN FROM PREVIOUS VISIT INFORMATION     Prior Function           Comments: poor historian     Hand Dominance        Extremity/Trunk Assessment               Lower Extremity Assessment: Difficult to assess due to impaired cognition         Communication   Communication: HOH  Cognition Arousal/Alertness: Lethargic   Overall Cognitive Status: Difficult to assess                      General Comments      Exercises        Assessment/Plan    PT Assessment Patient needs continued PT services  PT Diagnosis Difficulty walking;Altered mental status   PT Problem List Decreased strength;Decreased range of motion;Decreased activity tolerance;Decreased balance;Decreased mobility;Decreased coordination;Decreased cognition;Decreased knowledge of use of DME;Decreased safety awareness;Decreased knowledge of precautions;Obesity;Decreased skin integrity;Pain  PT Treatment Interventions DME instruction;Gait training;Functional mobility training;Therapeutic activities;Therapeutic exercise;Balance training;Neuromuscular re-education;Cognitive remediation;Patient/family education   PT Goals (Current goals can be found in the Care Plan section) Acute Rehab PT Goals Patient  Stated Goal: none stated PT Goal Formulation: Patient unable to participate in goal setting Time For Goal Achievement: 01/18/14 Potential to Achieve Goals: Fair    Frequency Min 3X/week   Barriers to discharge Inaccessible home environment      Co-evaluation               End of Session   Activity Tolerance: Patient limited by lethargy Patient left: in bed Nurse Communication: Mobility status         Time: 6295-2841 PT Time Calculation (min) (ACUTE ONLY): 23 min   Charges:   PT Evaluation $Initial PT Evaluation Tier I: 1  Procedure PT Treatments $Therapeutic Activity: 8-22 mins   PT G CodesDuncan Dull 01/04/2014, 4:04 PM Alben Deeds, [place] DPT  [phone]

## 2014-01-04 NOTE — ED Notes (Signed)
At this time pt is alert to self and situation, however believes it is 58 and Randy [name] is president. Reoriented patient.

## 2014-01-04 NOTE — ED Notes (Signed)
Attempted report 

## 2014-01-04 NOTE — Progress Notes (Signed)
Inpatient Diabetes Program Recommendations  AACE/ADA: New Consensus Statement on Inpatient Glycemic Control (2013)  Target Ranges:  Prepandial:   less than 140 mg/dL      Peak postprandial:   less than 180 mg/dL (1-2 hours)      Critically ill patients:  140 - 180 mg/dL   Inpatient Diabetes Program Recommendations Insulin - Basal: Please increase NPH to 14 units bid (ac before meals as at home) Diet: Pt ordered heart healthy diet-please add carb modified as combinaition HH/Carb mod diet.  Thank you, Rosita Kea, RN, CNS, Diabetes Coordinator [phone])

## 2014-01-04 NOTE — Progress Notes (Signed)
Echocardiogram 2D Echocardiogram with Definity has been performed.  Randy [name] 01/04/2014, 2:07 PM

## 2014-01-05 DIAGNOSIS — R7881 Bacteremia: Secondary | ICD-10-CM

## 2014-01-05 DIAGNOSIS — A4151 Sepsis due to Escherichia coli [E. coli]: Secondary | ICD-10-CM

## 2014-01-05 LAB — BASIC METABOLIC PANEL
ANION GAP: 14 (ref 5–15)
BUN: 29 mg/dL — ABNORMAL HIGH (ref 6–23)
CO2: 23 meq/L (ref 19–32)
Calcium: 8.8 mg/dL (ref 8.4–10.5)
Chloride: 102 mEq/L (ref 96–112)
Creatinine, Ser: 1.95 mg/dL — ABNORMAL HIGH (ref 0.50–1.35)
GFR calc Af Amer: 34 mL/min — ABNORMAL LOW (ref >90)
GFR, EST NON AFRICAN AMERICAN: 29 mL/min — AB (ref >90)
Glucose, Bld: 163 mg/dL — ABNORMAL HIGH (ref 70–99)
Potassium: 4.6 mEq/L (ref 3.7–5.3)
SODIUM: 139 meq/L (ref 137–147)

## 2014-01-05 LAB — CBC
HCT: 38.3 % — ABNORMAL LOW (ref 39.0–52.0)
Hemoglobin: 12.3 g/dL — ABNORMAL LOW (ref 13.0–17.0)
MCH: 26.9 pg (ref 26.0–34.0)
MCHC: 32.1 g/dL (ref 30.0–36.0)
MCV: 83.6 fL (ref 78.0–100.0)
PLATELETS: 294 10*3/uL (ref 150–400)
RBC: 4.58 MIL/uL (ref 4.22–5.81)
RDW: 16.5 % — ABNORMAL HIGH (ref 11.5–15.5)
WBC: 8 10*3/uL (ref 4.0–10.5)

## 2014-01-05 LAB — GLUCOSE, CAPILLARY
GLUCOSE-CAPILLARY: 170 mg/dL — AB (ref 70–99)
GLUCOSE-CAPILLARY: 210 mg/dL — AB (ref 70–99)
Glucose-Capillary: 161 mg/dL — ABNORMAL HIGH (ref 70–99)
Glucose-Capillary: 133 mg/dL — ABNORMAL HIGH (ref 70–99)
Glucose-Capillary: 200 mg/dL — ABNORMAL HIGH (ref 70–99)

## 2014-01-05 LAB — PROTIME-INR
INR: 2.46 — AB (ref 0.00–1.49)
Prothrombin Time: 26.9 seconds — ABNORMAL HIGH (ref 11.6–15.2)

## 2014-01-05 MED ORDER — CHLORHEXIDINE GLUCONATE CLOTH 2 % EX PADS
6.0000 | MEDICATED_PAD | Freq: Every day | CUTANEOUS | Status: AC
  Administered 2014-01-05 – 2014-01-09 (×5): 6 via TOPICAL

## 2014-01-05 MED ORDER — MUPIROCIN 2 % EX OINT
1.0000 "application " | TOPICAL_OINTMENT | Freq: Two times a day (BID) | CUTANEOUS | Status: DC
  Administered 2014-01-05 – 2014-01-09 (×9): 1 via NASAL
  Filled 2014-01-05 (×2): qty 22

## 2014-01-05 MED ORDER — FAMOTIDINE 20 MG PO TABS
20.0000 mg | ORAL_TABLET | Freq: Every day | ORAL | Status: DC
  Administered 2014-01-06 – 2014-01-09 (×4): 20 mg via ORAL
  Filled 2014-01-05 (×4): qty 1

## 2014-01-05 NOTE — Progress Notes (Signed)
Physical Therapy Treatment Patient Details Name: Randy [name] MRN: [id number] DOB: 1924/03/03 Today's Date: 01/05/2014    History of Present Illness Pt was admittedm for AMS and  for acute UTI.  He has a h/o Pacemaker, CAD and MI    PT Comments    Patient with some moments of verbalizations, but overall remains lethargic with poor ability to participate in session. Attempted EOB activities to facilitate arousal, unsuccessfully.  Attempted sit to stand but patient not able to participate.  Given level of lethargy, assisted patient back to bed, nsg aware. Will decrease frequency as indicated below and continue to progress as tolerated.   Follow Up Recommendations  SNF     Equipment Recommendations  None recommended by PT    Recommendations for Other Services       Precautions / Restrictions Precautions Precautions: Fall Restrictions Weight Bearing Restrictions: No    Mobility  Bed Mobility Overal bed mobility: Needs Assistance Bed Mobility: Supine to Sit;Sit to Supine     Supine to sit: Max assist;+2 for physical assistance Sit to supine: Total assist;+2 for physical assistance   General bed mobility comments: patient able to perform some assit with MAX cues and tactile hand placement on rail  Transfers Overall transfer level: Needs assistance Equipment used:  (gait belt and chuck pad) Transfers: Sit to/from Stand Sit to Stand: Total assist;+2 physical assistance         General transfer comment: unable to clear > 2 inches from the bed, patient not assisting, unsafe to perform further attempts  Ambulation/Gait                 Stairs            Wheelchair Mobility    Modified Rankin (Stroke Patients Only)       Balance Overall balance assessment: Needs assistance Sitting-balance support: Feet supported Sitting balance-Leahy Scale: Poor Sitting balance - Comments: patient required assist for sitting EOB, minimal arousal noted but patient  with difficulty engaging in activity                            Cognition Arousal/Alertness: Lethargic   Overall Cognitive Status: Difficult to assess                      Exercises      General Comments General comments (skin integrity, edema, etc.): multiple attempts to arouse patient.  Patient hygiene perfromed upon return to supine      Pertinent Vitals/Pain Pain Assessment: Faces Faces Pain Scale: Hurts little more Pain Location: left toes when putting on socks Pain Intervention(s): Monitored during session;Limited activity within patient's tolerance    Home Living                      Prior Function            PT Goals (current goals can now be found in the care plan section) Acute Rehab PT Goals Patient Stated Goal: minimal arousal PT Goal Formulation: Patient unable to participate in goal setting Time For Goal Achievement: 01/18/14 Potential to Achieve Goals: Fair Progress towards PT goals: Not progressing toward goals - comment    Frequency  Min 2X/week    PT Plan Current plan remains appropriate;Frequency needs to be updated    Co-evaluation             End of Session Equipment Utilized During Treatment: Gait belt  Activity Tolerance: Patient limited by lethargy Patient left: in bed     Time: 0832-0858 PT Time Calculation (min) (ACUTE ONLY): 26 min  Charges:  $Therapeutic Activity: 23-37 mins                    G CodesDuncan Dull 29-Jan-2014, 10:38 AM Alben Deeds, PT DPT  [phone]

## 2014-01-05 NOTE — Progress Notes (Signed)
ANTICOAGULATION and ANTIBIOTIC CONSULT NOTE - Follow Up Consult  Pharmacy Consult for Coumadin and Primaxin Indication: hx DVT, bacteremia coverage  No Known Allergies  Patient Measurements: Height: 5\' 7"  (170.2 cm) Weight: 214 lb 15.2 oz (97.5 kg) IBW/kg (Calculated) : 66.1  Vital Signs: Temp: 101.7 F (38.7 C) (11/20 1203) Temp Source: Oral (11/20 1203) BP: 122/50 mmHg (11/20 1203) Pulse Rate: 67 (11/20 1203)  Labs:  Recent Labs  01/03/14 2220 01/04/14 0550 01/04/14 1400 01/04/14 1848 01/05/14 0250  HGB 12.8* 11.7*  --   --  12.3*  HCT 39.9 37.2*  --   --  38.3*  PLT 298 326  --   --  294  LABPROT 31.0* 30.4*  --   --  26.9*  INR 2.96* 2.88*  --   --  2.46*  CREATININE 1.47* 1.94*  --   --  1.95*  TROPONINI <0.30 <0.30 <0.30 <0.30  --     Estimated Creatinine Clearance: 29.7 mL/min (by C-G formula based on Cr of 1.95).  Assessment:   INR remains therapeutic (2.46) on most recent Coumadin regimen of 7.5 mg daily.  Hx right leg DVT. Previously on Coumadin 5 mg daily.  Was at [facility] form 11/3-11/7/15. INRs were subtherapeutic during that admission (1.4-1.5), so Lovenox added during that admission, and continued on discharge 12/23/13.  Also on Plavix.    E coli in blood and urine cultures.  Changed from Rocephin to Primaxin last night.  Goal of Therapy:  INR 2-3 Monitor platelets by anticoagulation protocol: Yes   Plan:   Continue Coumadin 7.5 mg daily.  Daily PT/INR for now.    Continue Primaxin 250 mg IV q6hrs.  Follow up E coli sensitivities;   Arty Baumgartner,  Pager: [phone] 01/05/2014,2:59 PM

## 2014-01-05 NOTE — Clinical Social Work Psychosocial (Signed)
Clinical Social Work Department BRIEF PSYCHOSOCIAL ASSESSMENT 01/05/2014  Patient:  BARTON, WANT     Account Number:  [id number]     Admit date:  01/03/2014  Clinical Social Worker:  Domenica Reamer, [place]  Date/Time:  01/05/2014 04:38 PM  Referred by:  Physician  Date Referred:  01/05/2014 Referred for  SNF Placement   Other Referral:   Interview type:  Family Other interview type:    PSYCHOSOCIAL DATA Living Status:  FAMILY Admitted from facility:   Level of care:   Primary support name:  Jeydan Barner Primary support relationship to patient:  CHILD, ADULT Degree of support available:   Patients son stated that he checks on his father everyday.    CURRENT CONCERNS Current Concerns  Post-Acute Placement   Other Concerns:    SOCIAL WORK ASSESSMENT / PLAN CSW spoke with patients son concerning SNF placement- son reports that his is the POA for his father, his sister, and his sisters husband (both of whom are disabled- MS and ?). [name] stated that the patient/daughter/son in law all live together so that [name] is able to check in on them all at once.  [name] stated that patient has been to SNF in the past and his is agreeable to patient returning for SNF in [place] or in [place].   Assessment/plan status:  Psychosocial Support/Ongoing Assessment of Needs Other assessment/ plan:   FL2 update   Information/referral to community resources:   [place] [facility] [facility] snf    PATIENT'S/FAMILY'S RESPONSE TO PLAN OF CARE: Patients son is very agreeable to SNF placement but is concerned about where he ends up because he wants to continue to visit the patient regularly.       Domenica Reamer, [place] Social Worker [phone]

## 2014-01-05 NOTE — Progress Notes (Signed)
PATIENT DETAILS Name: Randy [name] Age: 80 y.o. Sex: male Date of Birth: 19-May-1924 Admit Date: 01/03/2014 Admitting Physician Ivor Costa, MD PCP:Pcp Not In System  Subjective: Lethargic, but answering questions appropriately  Assessment/Plan: Principal Problem:  Acute encephalopathy:likely secondary to UTI/Bacteremia. Improved with Abx.CT head negative for significant intracranial abnormalities.  Active Problems: Sepsis:secondary to UTI/Bacteremia. Much better with IVF, IV Abx. Await Urine cs.Blood cs positive for E Coli-sensitivity pending.  UTI: Febrile on admission but none since, recent urine culture Escherichia coli sensitive to Rocephin. Continue Rocephin. Await urine culture. Currently stable and without any nontoxic appearance.  E Coli Bacteremia: continue IV Abx, await sensitivities. Repeat Blood culture 11/20.  Acute renal failure: Likely prerenal secondary to sepsis. Continue IV fluids, follow renal function.  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 Pharmacy managing Coumadin.  History of CAD: No chest pain. Continue Plavix, Lipitor. Resume beta blocker when blood pressure more stable.  Hyperlipidemia: continue statin  Hypertension: BP controlled without any anti-hypertensives, holding all antihypertensives for now. Resume when able  Diabetes: CBGs stable, continue NPH, as his eye. Follow CBGs and titrate accordingly. Recent A1c on 11/47.7.  Anxiety: Continue BuSpar.  Deconditioning/generalized weakness: Secondary to sepsis, recent illness.PT evaluation.  Disposition: Remain inpatient-SNF early next week.  Antibiotics:  Rocephin 11/18>>11/19  Primaxin 11/19>>   Anti-infectives    Start     Dose/Rate Route Frequency Ordered Stop   01/04/14 2130  imipenem-cilastatin (PRIMAXIN) 250 mg in sodium chloride 0.9 % 100 mL IVPB     250 mg200 mL/hr over 30 Minutes Intravenous 4 times per day 01/04/14 2110     01/04/14 0100  cefTRIAXone  (ROCEPHIN) 1 g in dextrose 5 % 50 mL IVPB  Status:  Discontinued     1 g100 mL/hr over 30 Minutes Intravenous Every 24 hours 01/04/14 0019 01/04/14 2100   01/04/14 0000  cefTRIAXone (ROCEPHIN) 2 g in dextrose 5 % 50 mL IVPB  Status:  Discontinued     2 g100 mL/hr over 30 Minutes Intravenous  Once 01/03/14 2353 01/04/14 0022      DVT Prophylaxis: On coumadin  Code Status: Full code   Family Communication Luna Fuse the phone Abrahim Sargent -[phone]-  Procedures:  None  CONSULTS:  None  Time spent 40 minutes-which includes 50% of the time with face-to-face with patient/ family and coordinating care related to the above assessment and plan.  MEDICATIONS: Scheduled Meds: . antiseptic oral rinse  7 mL Mouth Rinse BID  . atorvastatin  10 mg Oral Daily  . busPIRone  15 mg Oral BID  . clopidogrel  75 mg Oral Daily  . famotidine  20 mg Oral BID  . feeding supplement (ENSURE COMPLETE)  237 mL Oral BID BM  . imipenem-cilastatin  250 mg Intravenous 4 times per day  . insulin aspart  0-9 Units Subcutaneous TID WC  . insulin NPH Human  10 Units Subcutaneous BID  . warfarin  7.5 mg Oral q1800  . Warfarin - Pharmacist Dosing Inpatient   Does not apply q1800   Continuous Infusions: . sodium chloride 100 mL/hr at 01/04/14 1842   PRN Meds:.acetaminophen, liver oil-zinc oxide    PHYSICAL EXAM: Vital signs in last 24 hours: Filed Vitals:   01/05/14 0039 01/05/14 0505 01/05/14 0830 01/05/14 0900  BP: 128/43 145/74 143/45   Pulse: 70 77 71   Temp:  98 F (36.7 C)  99.8 F (37.7 C)  TempSrc:  Axillary  Oral  Resp: 29 31 28    Height:      Weight:  97.5 kg (214 lb 15.2 oz)    SpO2: 96% 97% 97%     Weight change: 20.389 kg (44 lb 15.2 oz) Filed Weights   01/04/14 0210 01/04/14 0500 01/05/14 0505  Weight: 92.1 kg (203 lb 0.7 oz) 92.1 kg (203 lb 0.7 oz) 97.5 kg (214 lb 15.2 oz)   Body mass index is 33.66 kg/(m^2).   Gen Exam: Awake and  alert, but some what lethargic today   Neck: Supple, No JVD.   Chest: B/L Clear.   CVS: S1 S2 Regular, no murmurs.  Abdomen: soft, BS +, non tender, non distended.  Extremities: no edema, lower extremities warm to touch. Neurologic: Non Focal-but with gen weakness Skin: No Rash.   Wounds: N/A.   Intake/Output from previous day:  Intake/Output Summary (Last 24 hours) at 01/05/14 0944 Last data filed at 01/05/14 0900  Gross per 24 hour  Intake   1680 ml  Output      0 ml  Net   1680 ml     LAB RESULTS: CBC  Recent Labs Lab 01/03/14 2220 01/04/14 0550 01/05/14 0250  WBC 10.6* 15.9* 8.0  HGB 12.8* 11.7* 12.3*  HCT 39.9 37.2* 38.3*  PLT 298 326 294  MCV 84.4 84.7 83.6  MCH 27.1 26.7 26.9  MCHC 32.1 31.5 32.1  RDW 16.3* 16.5* 16.5*  LYMPHSABS 0.8  --   --   MONOABS 0.6  --   --   EOSABS 0.1  --   --   BASOSABS 0.0  --   --     Chemistries   Recent Labs Lab 01/03/14 2220 01/04/14 0550 01/05/14 0250  NA 135* 141 139  K 4.9 4.8 4.6  CL 98 103 102  CO2 21 24 23   GLUCOSE 215* 234* 163*  BUN 22 27* 29*  CREATININE 1.47* 1.94* 1.95*  CALCIUM 9.6 9.2 8.8    CBG:  Recent Labs Lab 01/03/14 2212 01/04/14 1226 01/04/14 1616 01/04/14 2322 01/05/14 0652  GLUCAP 209* 233* 168* 166* 161*    GFR Estimated Creatinine Clearance: 29.7 mL/min (by C-G formula based on Cr of 1.95).  Coagulation profile  Recent Labs Lab 01/03/14 2220 01/04/14 0550 01/05/14 0250  INR 2.96* 2.88* 2.46*    Cardiac Enzymes  Recent Labs Lab 01/04/14 0550 01/04/14 1400 01/04/14 1848  TROPONINI <0.30 <0.30 <0.30    Invalid input(s): POCBNP No results for input(s): DDIMER in the last 72 hours. No results for input(s): HGBA1C in the last 72 hours. No results for input(s): CHOL, HDL, LDLCALC, TRIG, CHOLHDL, LDLDIRECT in the last 72 hours. No results for input(s): TSH, T4TOTAL, T3FREE, THYROIDAB in the last 72 hours.  Invalid input(s): FREET3 No results for input(s):  VITAMINB12, FOLATE, FERRITIN, TIBC, IRON, RETICCTPCT in the last 72 hours. No results for input(s): LIPASE, AMYLASE in the last 72 hours.  Urine Studies No results for input(s): UHGB, CRYS in the last 72 hours.  Invalid input(s): UACOL, UAPR, USPG, UPH, UTP, UGL, UKET, UBIL, UNIT, UROB, ULEU, UEPI, UWBC, URBC, UBAC, CAST, UCOM, BILUA  MICROBIOLOGY: Recent Results (from the past 240 hour(s))  Culture, blood (routine x 2)     Status: None (Preliminary result)   Collection Time: 01/03/14 10:18 PM  Result Value Ref Range Status   Specimen Description BLOOD LEFT FOREARM  Final   Special Requests BOTTLES DRAWN AEROBIC AND ANAEROBIC 5CC EACH  Final   Culture  Setup Time  Final    01/04/2014 04:44 Performed at [place]   Final    ESCHERICHIA COLI Note: Gram Stain Report Called to,Read Back By and Verified With: MONICA LANE @ [place] BY [facility] Performed at [facility]    Report Status PENDING  Incomplete  Culture, blood (routine x 2)     Status: None (Preliminary result)   Collection Time: 01/03/14 10:20 PM  Result Value Ref Range Status   Specimen Description BLOOD RIGHT ARM  Final   Special Requests BOTTLES DRAWN AEROBIC AND ANAEROBIC 5CC EACH  Final   Culture  Setup Time   Final    01/04/2014 04:44 Performed at [facility]    Culture   Final    ESCHERICHIA COLI Note: Gram Stain Report Called to,Read Back By and Verified With: JENNIFER WALKER ON 01/04/2014 AT 8:20P BY WILEJ Performed at [facility]    Report Status PENDING  Incomplete  MRSA PCR Screening     Status: Abnormal   Collection Time: 01/04/14  2:15 AM  Result Value Ref Range Status   MRSA by PCR POSITIVE (A) NEGATIVE Final    Comment:        The GeneXpert MRSA Assay (FDA approved for NASAL specimens only), is one component of a comprehensive MRSA colonization surveillance program. It is not intended to diagnose MRSA infection nor to guide or monitor  treatment for MRSA infections. RESULT CALLED TO, READ BACK BY AND VERIFIED WITH: PROPST,N RN [id number] AT [phone] SKEEN,P     RADIOLOGY STUDIES/RESULTS: Dg Chest 2 View  12/20/2013   CLINICAL DATA:  Weakness generalized.  UTI.  Fever.  EXAM: CHEST  2 VIEW  COMPARISON:  None.  FINDINGS: Left dual lead cardiac pacemaker. Heart size is upper limits of normal. Lungs are clear without airspace disease or pulmonary edema. Degenerative changes in the thoracic spine. No definite pleural effusions.  IMPRESSION: No acute chest abnormality.   Electronically Signed   By: Markus Daft M.D.   On: 12/20/2013 01:11   Ct Head Wo Contrast  01/03/2014   CLINICAL DATA:  Altered mental status. Previous urinary tract infection. Pupils are unequal. No recent injury or fall.  EXAM: CT HEAD WITHOUT CONTRAST  TECHNIQUE: Contiguous axial images were obtained from the base of the skull through the vertex without intravenous contrast.  COMPARISON:  None.  FINDINGS: Diffuse cerebral atrophy. Mild ventricular dilatation consistent with central atrophy. Mild low attenuation change in the deep white matter likely due to small vessel ischemia. No mass effect or midline shift. No abnormal extra-axial fluid collections. Gray-white matter junctions are distinct. Basal cisterns are not effaced. No evidence of acute intracranial hemorrhage. No depressed skull fractures. Mild mucosal thickening in the paranasal sinuses. Mastoid air cells are not opacified.  IMPRESSION: No acute intracranial abnormalities. Chronic atrophy and small vessel ischemic changes.   Electronically Signed   By: Lucienne Capers M.D.   On: 01/03/2014 23:37   US Renal  12/20/2013   CLINICAL DATA:  Acute kidney injury. History of diabetes, hypertension, hyperlipidemia. Incontinence.  EXAM: RENAL/URINARY TRACT ULTRASOUND COMPLETE  COMPARISON:  None.  FINDINGS: Right Kidney:  Length: 11.7 cm. Echogenicity is normal. Large upper pole cyst is 10.8 x 8.1 x 10.4 cm. No  hydronephrosis.  Left Kidney:  Length: 11.0 cm. Echogenicity within normal limits. No mass or hydronephrosis visualized.  Bladder:  Within the posterior aspect of the bladder there is echogenic debris. Prevoid volume is calculated to be  196 cc. Postvoid residual is 107 cc.  IMPRESSION: 1. Large right upper pole cyst, 10.8 cm. 2. No hydronephrosis. 3. Significant postvoid residual calculated to be 107 cc. 4. Debris within the bladder.   Electronically Signed   By: Shon Hale M.D.   On: 12/20/2013 20:39   Dg Chest Port 1 View  01/03/2014   CLINICAL DATA:  Altered mental status, productive cough, fever, history diabetes, MI, hypertension, hyperlipidemia  EXAM: PORTABLE CHEST - 1 VIEW  COMPARISON:  Portable exam 2222 hr compared to 12/20/2013  FINDINGS: LEFT subclavian pacemaker leads project over RIGHT atrium and RIGHT ventricle.  Enlargement of cardiac silhouette.  Mediastinal contours and pulmonary vascularity normal.  Rotated to the RIGHT.  Lungs grossly clear.  No definite pleural effusion, pneumothorax or osseous abnormality.  IMPRESSION: Enlargement of cardiac silhouette post pacemaker.  No acute abnormalities.   Electronically Signed   By: Lavonia Dana M.D.   On: 01/03/2014 23:15    Oren Binet, MD  [facility] Pager:[phone]  If 7PM-7AM, please contact night-coverage [email or web address] Password [email or web address] 01/05/2014, 9:44 AM   LOS: 2 days

## 2014-01-05 NOTE — Progress Notes (Signed)
Patient temperature was taken at 1200 and documented 101.7; not made aware of temperature until 1500; temperature rechecked at that time and was 99.4. Dr Sloan Leiter notified via text page. Blood cultures collected this morning. Will continue to monitor. Teresita Madura RN

## 2014-01-05 NOTE — Clinical Social Work Placement (Addendum)
Clinical Social Work Department  CLINICAL SOCIAL WORK PLACEMENT NOTE 01/05/2014 01/08/14 - SEE ADDITIONAL COMMENTS BELOW Patient:  Randy [name], Randy [name]  Account Number:  [id number] Admit date:  01/03/2014  Clinical Social Worker:  Domenica Reamer, CLINICAL SOCIAL WORKER  Date/time:  01/05/2014 04:59 PM  Clinical Social Work is seeking post-discharge placement for this patient at the following level of care:   SKILLED NURSING   (*CSW will update this form in Epic as items are completed)   01/05/2014  Patient/family provided with [place] Department of Clinical Social Work's list of facilities offering this level of care within the geographic area requested by the patient (or if unable, by the patient's family).  01/05/2014  Patient/family informed of their freedom to choose among providers that offer the needed level of care, that participate in Medicare, Medicaid or managed care program needed by the patient, have an available bed and are willing to accept the patient.  01/05/2014  Patient/family informed of [facility]' ownership interest in [facility], as well as of the fact that they are under no obligation to receive care at this facility.  PASARR submitted to EDS on 01/05/2014 PASARR number received on 01/05/2014  FL2 transmitted to all facilities in geographic area requested by pt/family on  01/05/2014 FL2 transmitted to all facilities within larger geographic area on   Patient informed that his/her managed care company has contracts with or will negotiate with  certain facilities, including the following:     Patient/family informed of bed offers received: 01/08/14  Patient chooses bed at [facility] [place]  Physician recommends and patient chooses bed at    Patient to be transferred to [facility] on 01/09/14   Patient to be transferred to facility by private male caregiver. Patient transported in wheelchair van. Patient and family  notified of transfer on 01/09/14 Name of family member notified: Son, Ancil Dewan   The following physician request were entered in Epic:  Additional Comments: 01/08/14: CSW talked with son Ulrick Methot several times today regarding discharge to SNF. He was advised of [facility]'s bed offers: [facility] and [facility] and his choice is [facility]. Mr. Kittleson reported that patient has been there before and the care was adequate. CSW advised by admissions director Debbie Early that d/c from hospital to them will have to be Tuesday, 11/24 as son could not do admissions paperwork today and this must be done before patient can be admitted per Ms. Early.   Shelle Iron, LCSW [phone]) 01/09/14: Son was informed of cost of ambulance transport to [place] (over $1,000). He arranged transport by wheelchair Lucianne Lei.      Domenica Reamer, [place] Social Worker [phone]

## 2014-01-05 NOTE — Plan of Care (Signed)
Problem: Consults Goal: UTI/Pyelonephritis Patient Education See Patient Education Module for education specifics. Outcome: Progressing Goal: Skin Care Protocol Initiated - if Braden Score 18 or less If consults are not indicated, leave blank or document N/A Outcome: Completed/Met Date Met:  01/05/14 Goal: Diabetes Guidelines if Diabetic/Glucose > 140 If diabetic or lab glucose is > 140 mg/dl - Initiate Diabetes/Hyperglycemia Guidelines & Document Interventions  Outcome: Completed/Met Date Met:  01/05/14  Problem: Phase I Progression Outcomes Goal: Pain controlled with appropriate interventions Outcome: Completed/Met Date Met:  01/05/14 Goal: OOB as tolerated unless otherwise ordered Outcome: Completed/Met Date Met:  01/05/14 Goal: Initial discharge plan identified Outcome: Completed/Met Date Met:  01/05/14 Goal: Voiding-avoid urinary catheter unless indicated Outcome: Completed/Met Date Met:  01/05/14 Goal: Tolerating diet Outcome: Completed/Met Date Met:  01/05/14 Goal: Hemodynamically stable Outcome: Completed/Met Date Met:  01/05/14

## 2014-01-06 DIAGNOSIS — R531 Weakness: Secondary | ICD-10-CM

## 2014-01-06 LAB — BASIC METABOLIC PANEL
Anion gap: 14 (ref 5–15)
BUN: 27 mg/dL — AB (ref 6–23)
CALCIUM: 8 mg/dL — AB (ref 8.4–10.5)
CO2: 21 mEq/L (ref 19–32)
Chloride: 106 mEq/L (ref 96–112)
Creatinine, Ser: 1.71 mg/dL — ABNORMAL HIGH (ref 0.50–1.35)
GFR, EST AFRICAN AMERICAN: 40 mL/min — AB (ref >90)
GFR, EST NON AFRICAN AMERICAN: 34 mL/min — AB (ref >90)
Glucose, Bld: 174 mg/dL — ABNORMAL HIGH (ref 70–99)
Potassium: 4.1 mEq/L (ref 3.7–5.3)
SODIUM: 141 meq/L (ref 137–147)

## 2014-01-06 LAB — URINE CULTURE

## 2014-01-06 LAB — CBC
HEMATOCRIT: 33.1 % — AB (ref 39.0–52.0)
Hemoglobin: 10.6 g/dL — ABNORMAL LOW (ref 13.0–17.0)
MCH: 26.7 pg (ref 26.0–34.0)
MCHC: 32 g/dL (ref 30.0–36.0)
MCV: 83.4 fL (ref 78.0–100.0)
Platelets: 246 10*3/uL (ref 150–400)
RBC: 3.97 MIL/uL — ABNORMAL LOW (ref 4.22–5.81)
RDW: 16.5 % — AB (ref 11.5–15.5)
WBC: 6 10*3/uL (ref 4.0–10.5)

## 2014-01-06 LAB — PROTIME-INR
INR: 2.24 — ABNORMAL HIGH (ref 0.00–1.49)
Prothrombin Time: 25 seconds — ABNORMAL HIGH (ref 11.6–15.2)

## 2014-01-06 LAB — CULTURE, BLOOD (ROUTINE X 2)

## 2014-01-06 LAB — GLUCOSE, CAPILLARY
GLUCOSE-CAPILLARY: 139 mg/dL — AB (ref 70–99)
Glucose-Capillary: 105 mg/dL — ABNORMAL HIGH (ref 70–99)

## 2014-01-06 MED ORDER — BACITRACIN ZINC 500 UNIT/GM EX OINT
TOPICAL_OINTMENT | Freq: Two times a day (BID) | CUTANEOUS | Status: DC
  Administered 2014-01-06 – 2014-01-09 (×6): via TOPICAL
  Filled 2014-01-06: qty 28.35

## 2014-01-06 NOTE — Consult Note (Signed)
WOC wound consult note Reason for Consult: LGT wound.  Patient had recent surgery at podiatrists office for removal of infected paronychia on medial nail of LGT. Wound type: Surgical Pressure Ulcer POA: No Measurement: 1.5cm x 0.2cm x 0.1cm Wound KMM:NOTRR, moist Drainage (amount, consistency, odor) Small amount of serosanguinous drainage on old dressing Periwound:intact Dressing procedure/placement/frequency: I will implement nursing orders for twice daily cleansing and application of bacitracin ointment to the surgical site followed by a dry dressing. This can be continued for two weeks until follow up appointment with podiatry. [facility] nursing team will not follow, but will remain available to this patient, the nursing and medical team.  Please re-consult if needed. Thanks, Maudie Flakes, MSN, RN, [place], [place], [place] [phone])

## 2014-01-06 NOTE — Progress Notes (Signed)
ANTICOAGULATION and ANTIBIOTIC CONSULT NOTE - Follow Up Consult  Pharmacy Consult for Coumadin and Primaxin Indication: hx DVT, bacteremia coverage  No Known Allergies  Patient Measurements: Height: 5\' 7"  (170.2 cm) Weight: 209 lb 14.1 oz (95.2 kg) IBW/kg (Calculated) : 66.1  Vital Signs: Temp: 100.8 F (38.2 C) (11/21 1153) Temp Source: Axillary (11/21 1153) BP: 136/55 mmHg (11/21 1153) Pulse Rate: 73 (11/21 1153)  Labs:  Recent Labs  01/04/14 0550 01/04/14 1400 01/04/14 1848 01/05/14 0250 01/06/14 0338  HGB 11.7*  --   --  12.3* 10.6*  HCT 37.2*  --   --  38.3* 33.1*  PLT 326  --   --  294 246  LABPROT 30.4*  --   --  26.9* 25.0*  INR 2.88*  --   --  2.46* 2.24*  CREATININE 1.94*  --   --  1.95* 1.71*  TROPONINI <0.30 <0.30 <0.30  --   --     Estimated Creatinine Clearance: 33.4 mL/min (by C-G formula based on Cr of 1.71).  Assessment:   INR remains therapeutic (2.24) on most recent Coumadin regimen of 7.5 mg daily, but has trended down over the last few days.  Hx right leg DVT. Previously on Coumadin 5 mg daily.  Was at [facility] form [facility]/3-11/7/15. INRs were subtherapeutic during that admission (1.4-1.5), so Lovenox added during that admission, and continued on discharge 12/23/13.  Also on Plavix.    E coli in blood and urine cultures now reported as ESBL-producing. Sensitive to Primaxin, day #3.  Febrile last night, WBC 6.0.   Scr has trended down some but current Primaxin dose remains appropriate.  Goal of Therapy:  INR 2-3 Monitor platelets by anticoagulation protocol: Yes   Plan:   Continue Coumadin 7.5 mg daily.  Continue Daily PT/INR.    Continue Primaxin 250 mg IV q6hrs.  Will follow renal function for any need to adjust regimen.  Arty Baumgartner, [place] Pager: [phone] 01/06/2014,2:03 PM

## 2014-01-06 NOTE — Progress Notes (Signed)
PATIENT DETAILS Name: Randy [name] Age: 80 y.o. Sex: male Date of Birth: 1924/03/11 Admit Date: 01/03/2014 Admitting Physician Ivor Costa, MD PCP:Pcp Not In System  Subjective: Awake and alert today. Febrile last night.  Assessment/Plan: Principal Problem:  Acute encephalopathy:likely secondary to UTI/E. Coli Bacteremia. Improved with Abx.CT head negative for significant intracranial abnormalities.  Active Problems: Sepsis:secondary to UTI/E Coli Bacteremia. Much better with IVF, IV Abx. Await Urine cs.Blood cs positive for E Coli-sensitivity pending.  UTI: Febrile on admission, and overnight. But non toxic appearance. Continue Primaxin. Await sensitivities.   E Coli Bacteremia: continue IV Abx, await sensitivities. Repeat Blood culture 11/20 pending.  Acute renal failure: Likely prerenal secondary to sepsis. Continue IV fluids, follow renal function-creatinine now down trending.  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 Pharmacy managing Coumadin.  History of CAD: No chest pain. Continue Plavix, Lipitor. Resume beta blocker when blood pressure more stable.  Hyperlipidemia: continue statin  Hypertension: BP controlled without any anti-hypertensives, holding all antihypertensives for now. Resume when able  Diabetes: CBGs stable, continue NPH,and SSI. Follow CBGs and titrate accordingly. Recent A1c on 11/47.7.  Anxiety: Continue BuSpar.  Deconditioning/generalized weakness: Secondary to sepsis, recent illness.PT evaluation completed-recommendations are for SNF  Disposition: Remain inpatient-SNF early next week-transfer to floor.  Antibiotics:  Rocephin 11/18>>11/19  Primaxin 11/19>>   Anti-infectives    Start     Dose/Rate Route Frequency Ordered Stop   01/04/14 2130  imipenem-cilastatin (PRIMAXIN) 250 mg in sodium chloride 0.9 % 100 mL IVPB     250 mg200 mL/hr over 30 Minutes Intravenous 4 times per day 01/04/14 2110     01/04/14 0100   cefTRIAXone (ROCEPHIN) 1 g in dextrose 5 % 50 mL IVPB  Status:  Discontinued     1 g100 mL/hr over 30 Minutes Intravenous Every 24 hours 01/04/14 0019 01/04/14 2100   01/04/14 0000  cefTRIAXone (ROCEPHIN) 2 g in dextrose 5 % 50 mL IVPB  Status:  Discontinued     2 g100 mL/hr over 30 Minutes Intravenous  Once 01/03/14 2353 01/04/14 0022      DVT Prophylaxis: On coumadin  Code Status: Full code   Family Communication Luna Fuse the phone on 11/20 Kamar Callender -[phone] 11/20  Procedures:  None  CONSULTS:  None  MEDICATIONS: Scheduled Meds: . antiseptic oral rinse  7 mL Mouth Rinse BID  . atorvastatin  10 mg Oral Daily  . busPIRone  15 mg Oral BID  . Chlorhexidine Gluconate Cloth  6 each Topical Q0600  . clopidogrel  75 mg Oral Daily  . famotidine  20 mg Oral Daily  . feeding supplement (ENSURE COMPLETE)  237 mL Oral BID BM  . imipenem-cilastatin  250 mg Intravenous 4 times per day  . insulin aspart  0-9 Units Subcutaneous TID WC  . insulin NPH Human  10 Units Subcutaneous BID  . mupirocin ointment  1 application Nasal BID  . warfarin  7.5 mg Oral q1800  . Warfarin - Pharmacist Dosing Inpatient   Does not apply q1800   Continuous Infusions: . sodium chloride 100 mL/hr at 01/06/14 0443   PRN Meds:.acetaminophen, liver oil-zinc oxide    PHYSICAL EXAM: Vital signs in last 24 hours: Filed Vitals:   01/06/14 0102 01/06/14 0230 01/06/14 0400 01/06/14 0751  BP: 138/51  106/54 150/59  Pulse: 75  70 70  Temp: 102.6 F (39.2 C) 98.7 F (37.1 C) 99.2 F (37.3 C) 97.1 F (36.2 C)  TempSrc:  Axillary Oral Axillary Oral  Resp: 29  22 24   Height:      Weight:   95.2 kg (209 lb 14.1 oz)   SpO2: 98%  96% 98%    Weight change: -2.3 kg (-5 lb 1.1 oz) Filed Weights   01/04/14 0500 01/05/14 0505 01/06/14 0400  Weight: 92.1 kg (203 lb 0.7 oz) 97.5 kg (214 lb 15.2 oz) 95.2 kg (209 lb 14.1 oz)   Body mass index is 32.86 kg/(m^2).    Gen Exam: Awake and alert, but some what lethargic today   Neck: Supple, No JVD.   Chest: B/L Clear.   CVS: S1 S2 Regular, no murmurs.  Abdomen: soft, BS +, non tender, non distended.  Extremities: no edema, lower extremities warm to touch. Neurologic: Non Focal-but with gen weakness Skin: No Rash.   Wounds: N/A.   Intake/Output from previous day:  Intake/Output Summary (Last 24 hours) at 01/06/14 1009 Last data filed at 01/06/14 0600  Gross per 24 hour  Intake   2340 ml  Output      0 ml  Net   2340 ml     LAB RESULTS: CBC  Recent Labs Lab 01/03/14 2220 01/04/14 0550 01/05/14 0250 01/06/14 0338  WBC 10.6* 15.9* 8.0 6.0  HGB 12.8* 11.7* 12.3* 10.6*  HCT 39.9 37.2* 38.3* 33.1*  PLT 298 326 294 246  MCV 84.4 84.7 83.6 83.4  MCH 27.1 26.7 26.9 26.7  MCHC 32.1 31.5 32.1 32.0  RDW 16.3* 16.5* 16.5* 16.5*  LYMPHSABS 0.8  --   --   --   MONOABS 0.6  --   --   --   EOSABS 0.1  --   --   --   BASOSABS 0.0  --   --   --     Chemistries   Recent Labs Lab 01/03/14 2220 01/04/14 0550 01/05/14 0250 01/06/14 0338  NA 135* 141 139 141  K 4.9 4.8 4.6 4.1  CL 98 103 102 106  CO2 21 24 23 21   GLUCOSE 215* 234* 163* 174*  BUN 22 27* 29* 27*  CREATININE 1.47* 1.94* 1.95* 1.71*  CALCIUM 9.6 9.2 8.8 8.0*    CBG:  Recent Labs Lab 01/05/14 0652 01/05/14 1209 01/05/14 1659 01/05/14 2239 01/06/14 0757  GLUCAP 161* 210* 133* 200* 139*    GFR Estimated Creatinine Clearance: 33.4 mL/min (by C-G formula based on Cr of 1.71).  Coagulation profile  Recent Labs Lab 01/03/14 2220 01/04/14 0550 01/05/14 0250 01/06/14 0338  INR 2.96* 2.88* 2.46* 2.24*    Cardiac Enzymes  Recent Labs Lab 01/04/14 0550 01/04/14 1400 01/04/14 1848  TROPONINI <0.30 <0.30 <0.30    Invalid input(s): POCBNP No results for input(s): DDIMER in the last 72 hours. No results for input(s): HGBA1C in the last 72 hours. No results for input(s): CHOL, HDL, LDLCALC, TRIG, CHOLHDL,  LDLDIRECT in the last 72 hours. No results for input(s): TSH, T4TOTAL, T3FREE, THYROIDAB in the last 72 hours.  Invalid input(s): FREET3 No results for input(s): VITAMINB12, FOLATE, FERRITIN, TIBC, IRON, RETICCTPCT in the last 72 hours. No results for input(s): LIPASE, AMYLASE in the last 72 hours.  Urine Studies No results for input(s): UHGB, CRYS in the last 72 hours.  Invalid input(s): UACOL, UAPR, USPG, UPH, UTP, UGL, UKET, UBIL, UNIT, UROB, ULEU, UEPI, UWBC, URBC, UBAC, CAST, UCOM, BILUA  MICROBIOLOGY: Recent Results (from the past 240 hour(s))  Culture, blood (routine x 2)     Status: None   Collection Time:  01/03/14 10:18 PM  Result Value Ref Range Status   Specimen Description BLOOD LEFT FOREARM  Final   Special Requests BOTTLES DRAWN AEROBIC AND ANAEROBIC [facility] EACH  Final   Culture  Setup Time   Final    01/04/2014 04:44 Performed at [facility]    Culture   Final    ESCHERICHIA COLI Note: SUSCEPTIBILITIES PERFORMED ON PREVIOUS CULTURE WITHIN THE LAST 5 DAYS. Confirmed Extended Spectrum Beta-Lactamase Producer (ESBL) CRITICAL RESULT CALLED TO, READ BACK BY AND VERIFIED WITH: MARGE LESSARD 01/06/14 @ 9:48AM BY RUSCOE A. Note: Gram Stain Report Called to,Read Back By and Verified With: MONICA LANE @ 1960 ON [phone] BY [facility] Performed at [facility]    Report Status 01/06/2014 FINAL  Final  Culture, blood (routine x 2)     Status: None   Collection Time: 01/03/14 10:20 PM  Result Value Ref Range Status   Specimen Description BLOOD RIGHT ARM  Final   Special Requests BOTTLES DRAWN AEROBIC AND ANAEROBIC [facility] EACH  Final   Culture  Setup Time   Final    01/04/2014 04:44 Performed at [facility]    Culture   Final    ESCHERICHIA COLI Note: Confirmed Extended Spectrum Beta-Lactamase Producer (ESBL) Note: Gram Stain Report Called to,Read Back By and Verified With: JENNIFER WALKER ON 01/04/2014 AT 8:20P BY WILEJ CRITICAL RESULT CALLED TO, READ BACK BY  AND VERIFIED WITH: MARGE LESSARD 01/06/14 @ 9:48AM BY RUSCOE A. Performed at [facility]    Report Status 01/06/2014 FINAL  Final   Organism ID, Bacteria ESCHERICHIA COLI  Final      Susceptibility   Escherichia coli - MIC*    AMPICILLIN >=32 RESISTANT Resistant     AMPICILLIN/SULBACTAM 16 INTERMEDIATE Intermediate     CEFAZOLIN >=64 RESISTANT Resistant     CEFEPIME RESISTANT      CEFTAZIDIME RESISTANT      CEFTRIAXONE >=64 RESISTANT Resistant     CIPROFLOXACIN >=4 RESISTANT Resistant     GENTAMICIN <=1 SENSITIVE Sensitive     IMIPENEM <=0.25 SENSITIVE Sensitive     PIP/TAZO <=4 SENSITIVE Sensitive     TOBRAMYCIN <=1 SENSITIVE Sensitive     TRIMETH/SULFA <=20 SENSITIVE Sensitive     * ESCHERICHIA COLI  Urine culture     Status: None (Preliminary result)   Collection Time: 01/03/14 10:54 PM  Result Value Ref Range Status   Specimen Description URINE, CATHETERIZED  Final   Special Requests CX ADDED AT 0422 ON [id number]  Final   Culture  Setup Time   Final    01/04/2014 12:40 Performed at [place]   Final    >=100,000 COLONIES/ML Performed at [facility]    Culture   Final    ESCHERICHIA COLI Performed at [facility]    Report Status PENDING  Incomplete  MRSA PCR Screening     Status: Abnormal   Collection Time: 01/04/14  2:15 AM  Result Value Ref Range Status   MRSA by PCR POSITIVE (A) NEGATIVE Final    Comment:        The GeneXpert MRSA Assay (FDA approved for NASAL specimens only), is one component of a comprehensive MRSA colonization surveillance program. It is not intended to diagnose MRSA infection nor to guide or monitor treatment for MRSA infections. RESULT CALLED TO, READ BACK BY AND VERIFIED WITH: PROPST,N RN [phone] AT [phone] SKEEN,P     RADIOLOGY STUDIES/RESULTS: Dg Chest 2 View  12/20/2013   CLINICAL DATA:  Weakness generalized.  UTI.  Fever.  EXAM: CHEST  2 VIEW  COMPARISON:  None.  FINDINGS: Left  dual lead cardiac pacemaker. Heart size is upper limits of normal. Lungs are clear without airspace disease or pulmonary edema. Degenerative changes in the thoracic spine. No definite pleural effusions.  IMPRESSION: No acute chest abnormality.   Electronically Signed   By: Markus Daft M.D.   On: 12/20/2013 01:11   Ct Head Wo Contrast  01/03/2014   CLINICAL DATA:  Altered mental status. Previous urinary tract infection. Pupils are unequal. No recent injury or fall.  EXAM: CT HEAD WITHOUT CONTRAST  TECHNIQUE: Contiguous axial images were obtained from the base of the skull through the vertex without intravenous contrast.  COMPARISON:  None.  FINDINGS: Diffuse cerebral atrophy. Mild ventricular dilatation consistent with central atrophy. Mild low attenuation change in the deep white matter likely due to small vessel ischemia. No mass effect or midline shift. No abnormal extra-axial fluid collections. Gray-white matter junctions are distinct. Basal cisterns are not effaced. No evidence of acute intracranial hemorrhage. No depressed skull fractures. Mild mucosal thickening in the paranasal sinuses. Mastoid air cells are not opacified.  IMPRESSION: No acute intracranial abnormalities. Chronic atrophy and small vessel ischemic changes.   Electronically Signed   By: Lucienne Capers M.D.   On: 01/03/2014 23:37   US Renal  12/20/2013   CLINICAL DATA:  Acute kidney injury. History of diabetes, hypertension, hyperlipidemia. Incontinence.  EXAM: RENAL/URINARY TRACT ULTRASOUND COMPLETE  COMPARISON:  None.  FINDINGS: Right Kidney:  Length: 11.7 cm. Echogenicity is normal. Large upper pole cyst is 10.8 x 8.1 x 10.4 cm. No hydronephrosis.  Left Kidney:  Length: 11.0 cm. Echogenicity within normal limits. No mass or hydronephrosis visualized.  Bladder:  Within the posterior aspect of the bladder there is echogenic debris. Prevoid volume is calculated to be 196 cc. Postvoid residual is 107 cc.  IMPRESSION: 1. Large right upper  pole cyst, 10.8 cm. 2. No hydronephrosis. 3. Significant postvoid residual calculated to be 107 cc. 4. Debris within the bladder.   Electronically Signed   By: Shon Hale M.D.   On: 12/20/2013 20:39   Dg Chest Port 1 View  01/03/2014   CLINICAL DATA:  Altered mental status, productive cough, fever, history diabetes, MI, hypertension, hyperlipidemia  EXAM: PORTABLE CHEST - 1 VIEW  COMPARISON:  Portable exam 2222 hr compared to 12/20/2013  FINDINGS: LEFT subclavian pacemaker leads project over RIGHT atrium and RIGHT ventricle.  Enlargement of cardiac silhouette.  Mediastinal contours and pulmonary vascularity normal.  Rotated to the RIGHT.  Lungs grossly clear.  No definite pleural effusion, pneumothorax or osseous abnormality.  IMPRESSION: Enlargement of cardiac silhouette post pacemaker.  No acute abnormalities.   Electronically Signed   By: Lavonia Dana M.D.   On: 01/03/2014 23:15    Oren Binet, MD  [facility] Pager:[phone]  If 7PM-7AM, please contact night-coverage [email or web address] Password [email or web address] 01/06/2014, 10:09 AM   LOS: 3 days

## 2014-01-07 LAB — BASIC METABOLIC PANEL
ANION GAP: 13 (ref 5–15)
BUN: 21 mg/dL (ref 6–23)
CO2: 22 mEq/L (ref 19–32)
Calcium: 8.3 mg/dL — ABNORMAL LOW (ref 8.4–10.5)
Chloride: 101 mEq/L (ref 96–112)
Creatinine, Ser: 1.52 mg/dL — ABNORMAL HIGH (ref 0.50–1.35)
GFR calc Af Amer: 46 mL/min — ABNORMAL LOW (ref >90)
GFR, EST NON AFRICAN AMERICAN: 39 mL/min — AB (ref >90)
Glucose, Bld: 169 mg/dL — ABNORMAL HIGH (ref 70–99)
Potassium: 4 mEq/L (ref 3.7–5.3)
SODIUM: 136 meq/L — AB (ref 137–147)

## 2014-01-07 LAB — GLUCOSE, CAPILLARY
GLUCOSE-CAPILLARY: 112 mg/dL — AB (ref 70–99)
Glucose-Capillary: 129 mg/dL — ABNORMAL HIGH (ref 70–99)
Glucose-Capillary: 156 mg/dL — ABNORMAL HIGH (ref 70–99)
Glucose-Capillary: 215 mg/dL — ABNORMAL HIGH (ref 70–99)

## 2014-01-07 LAB — PROTIME-INR
INR: 2.91 — AB (ref 0.00–1.49)
PROTHROMBIN TIME: 30.6 s — AB (ref 11.6–15.2)

## 2014-01-07 MED ORDER — SODIUM CHLORIDE 0.9 % IV SOLN
INTRAVENOUS | Status: AC
  Administered 2014-01-07: 11:00:00 via INTRAVENOUS

## 2014-01-07 MED ORDER — WARFARIN SODIUM 5 MG PO TABS
5.0000 mg | ORAL_TABLET | Freq: Once | ORAL | Status: AC
  Administered 2014-01-07: 5 mg via ORAL
  Filled 2014-01-07: qty 1

## 2014-01-07 NOTE — Progress Notes (Signed)
Pt's son Izsak Meir returned call and gave telephone consent for PICC line placement witnessed by Carole Civil, RN and Kizzie Ide, RN.

## 2014-01-07 NOTE — Plan of Care (Signed)
Problem: Phase I Progression Outcomes Goal: Vital Signs stable- temperature less than 102 Outcome: Completed/Met Date Met:  01/07/14 Goal: Adequate I & O Outcome: Completed/Met Date Met:  01/07/14

## 2014-01-07 NOTE — Progress Notes (Signed)
PATIENT DETAILS Name: Randy [name] Age: 80 y.o. Sex: male Date of Birth: May 10, 1924 Admit Date: 01/03/2014 Admitting Physician Ivor Costa, MD PCP:Pcp Not In System  Subjective: Awake and alert today. No major complaints.  Assessment/Plan: Principal Problem:  Acute encephalopathy:likely secondary to UTI/ESBL E. Coli Bacteremia. Improved with Abx.CT head negative for significant intracranial abnormalities.  Active Problems: Sepsis:secondary to UTI/ESBL E Coli Bacteremia. Much better with IVF, IV Abx.Urine cs and Blood cs positive for ESBL E Coli.Maintained on Primaxin, since repeat blood cultures on 11/20 continue to be negative, will place PICC, and plan on 2 weeks of IV Invanz on discharge.  UTI: Febrile on admission. But non toxic appearance. Abx and Cultures as above.  E Coli Bacteremia: continue IV Abx, await sensitivities. Repeat Blood culture 11/20 negative.As noted above, much improved.  Acute renal failure: Likely prerenal secondary to sepsis. Continue IV fluids, follow renal function-creatinine now down trending.  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 Pharmacy managing Coumadin.  History of CAD: No chest pain. Continue Plavix, Lipitor. Resume beta blocker when blood pressure more stable.  Hyperlipidemia: continue statin  Hypertension: BP controlled without any anti-hypertensives, holding all antihypertensives for now. Resume when able  Diabetes: CBGs stable, continue NPH,and SSI. Follow CBGs and titrate accordingly. Recent A1c on 11/47.7.  Anxiety: Continue BuSpar.  Deconditioning/generalized weakness: Secondary to sepsis, recent illness.PT evaluation completed-recommendations are for SNF  Disposition: Remain inpatient-SNF 11/23 with PICC line.  Antibiotics:  Rocephin 11/18>>11/19  Primaxin 11/19>>   Anti-infectives    Start     Dose/Rate Route Frequency Ordered Stop   01/04/14 2130  imipenem-cilastatin (PRIMAXIN) 250 mg in sodium  chloride 0.9 % 100 mL IVPB     250 mg200 mL/hr over 30 Minutes Intravenous 4 times per day 01/04/14 2110     01/04/14 0100  cefTRIAXone (ROCEPHIN) 1 g in dextrose 5 % 50 mL IVPB  Status:  Discontinued     1 g100 mL/hr over 30 Minutes Intravenous Every 24 hours 01/04/14 0019 01/04/14 2100   01/04/14 0000  cefTRIAXone (ROCEPHIN) 2 g in dextrose 5 % 50 mL IVPB  Status:  Discontinued     2 g100 mL/hr over 30 Minutes Intravenous  Once 01/03/14 2353 01/04/14 0022      DVT Prophylaxis: On coumadin  Code Status: Full code   Family Communication Luna Fuse the phone on 11/20 Rondall Radigan -[id number] 11/20  Procedures:  None  CONSULTS:  None  MEDICATIONS: Scheduled Meds: . antiseptic oral rinse  7 mL Mouth Rinse BID  . atorvastatin  10 mg Oral Daily  . bacitracin   Topical BID  . busPIRone  15 mg Oral BID  . Chlorhexidine Gluconate Cloth  6 each Topical Q0600  . clopidogrel  75 mg Oral Daily  . famotidine  20 mg Oral Daily  . feeding supplement (ENSURE COMPLETE)  237 mL Oral BID BM  . imipenem-cilastatin  250 mg Intravenous 4 times per day  . insulin aspart  0-9 Units Subcutaneous TID WC  . insulin NPH Human  10 Units Subcutaneous BID  . mupirocin ointment  1 application Nasal BID  . warfarin  7.5 mg Oral q1800  . Warfarin - Pharmacist Dosing Inpatient   Does not apply q1800   Continuous Infusions: . sodium chloride 40 mL/hr at 01/07/14 1103   PRN Meds:.acetaminophen, liver oil-zinc oxide    PHYSICAL EXAM: Vital signs in last 24 hours: Filed Vitals:   01/06/14 1700 01/06/14 2038 01/07/14  0559 01/07/14 1012  BP: 110/49 135/74 154/68 133/59  Pulse: 70 77 75 70  Temp:  98.4 F (36.9 C) 97.5 F (36.4 C) 97.3 F (36.3 C)  TempSrc:  Oral Oral Oral  Resp: 25 20 19 18   Height:      Weight:  98.294 kg (216 lb 11.2 oz)    SpO2: 95% 99% 97% 98%    Weight change: 3.094 kg (6 lb 13.2 oz) Filed Weights   01/05/14 0505 01/06/14 0400  01/06/14 2038  Weight: 97.5 kg (214 lb 15.2 oz) 95.2 kg (209 lb 14.1 oz) 98.294 kg (216 lb 11.2 oz)   Body mass index is 33.93 kg/(m^2).   Gen Exam: Awake and alert. Neck: Supple, No JVD.   Chest: B/L Clear.  No rales or rhonchi CVS: S1 S2 Regular, no murmurs.  Abdomen: soft, BS +, non tender, non distended.  Extremities: no edema, lower extremities warm to touch. Neurologic: Non Focal-but with gen weakness Skin: No Rash.   Wounds: N/A.   Intake/Output from previous day:  Intake/Output Summary (Last 24 hours) at 01/07/14 1133 Last data filed at 01/07/14 1013  Gross per 24 hour  Intake 3008.75 ml  Output    675 ml  Net 2333.75 ml     LAB RESULTS: CBC  Recent Labs Lab 01/03/14 2220 01/04/14 0550 01/05/14 0250 01/06/14 0338  WBC 10.6* 15.9* 8.0 6.0  HGB 12.8* 11.7* 12.3* 10.6*  HCT 39.9 37.2* 38.3* 33.1*  PLT 298 326 294 246  MCV 84.4 84.7 83.6 83.4  MCH 27.1 26.7 26.9 26.7  MCHC 32.1 31.5 32.1 32.0  RDW 16.3* 16.5* 16.5* 16.5*  LYMPHSABS 0.8  --   --   --   MONOABS 0.6  --   --   --   EOSABS 0.1  --   --   --   BASOSABS 0.0  --   --   --     Chemistries   Recent Labs Lab 01/03/14 2220 01/04/14 0550 01/05/14 0250 01/06/14 0338 01/07/14 0850  NA 135* 141 139 141 136*  K 4.9 4.8 4.6 4.1 4.0  CL 98 103 102 106 101  CO2 21 24 23 21 22   GLUCOSE 215* 234* 163* 174* 169*  BUN 22 27* 29* 27* 21  CREATININE 1.47* 1.94* 1.95* 1.71* 1.52*  CALCIUM 9.6 9.2 8.8 8.0* 8.3*    CBG:  Recent Labs Lab 01/05/14 1659 01/05/14 2239 01/06/14 0757 01/06/14 2129 01/07/14 0732  GLUCAP 133* 200* 139* 105* 112*    GFR Estimated Creatinine Clearance: 38.3 mL/min (by C-G formula based on Cr of 1.52).  Coagulation profile  Recent Labs Lab 01/03/14 2220 01/04/14 0550 01/05/14 0250 01/06/14 0338  INR 2.96* 2.88* 2.46* 2.24*    Cardiac Enzymes  Recent Labs Lab 01/04/14 0550 01/04/14 1400 01/04/14 1848  TROPONINI <0.30 <0.30 <0.30    Invalid input(s):  POCBNP No results for input(s): DDIMER in the last 72 hours. No results for input(s): HGBA1C in the last 72 hours. No results for input(s): CHOL, HDL, LDLCALC, TRIG, CHOLHDL, LDLDIRECT in the last 72 hours. No results for input(s): TSH, T4TOTAL, T3FREE, THYROIDAB in the last 72 hours.  Invalid input(s): FREET3 No results for input(s): VITAMINB12, FOLATE, FERRITIN, TIBC, IRON, RETICCTPCT in the last 72 hours. No results for input(s): LIPASE, AMYLASE in the last 72 hours.  Urine Studies No results for input(s): UHGB, CRYS in the last 72 hours.  Invalid input(s): UACOL, UAPR, USPG, UPH, UTP, UGL, UKET, UBIL, UNIT, UROB, ULEU,  UEPI, UWBC, URBC, UBAC, CAST, UCOM, BILUA  MICROBIOLOGY: Recent Results (from the past 240 hour(s))  Culture, blood (routine x 2)     Status: None   Collection Time: 01/03/14 10:18 PM  Result Value Ref Range Status   Specimen Description BLOOD LEFT FOREARM  Final   Special Requests BOTTLES DRAWN AEROBIC AND ANAEROBIC 5CC EACH  Final   Culture  Setup Time   Final    01/04/2014 04:44 Performed at [facility]    Culture   Final    ESCHERICHIA COLI Note: SUSCEPTIBILITIES PERFORMED ON PREVIOUS CULTURE WITHIN THE LAST 5 DAYS. Confirmed Extended Spectrum Beta-Lactamase Producer (ESBL) CRITICAL RESULT CALLED TO, READ BACK BY AND VERIFIED WITH: MARGE LESSARD 01/06/14 @ 9:48AM BY RUSCOE A. Note: Gram Stain Report Called to,Read Back By and Verified With: MONICA LANE @ 2458 ON [phone] BY [facility] Performed at [facility]    Report Status 01/06/2014 FINAL  Final  Culture, blood (routine x 2)     Status: None   Collection Time: 01/03/14 10:20 PM  Result Value Ref Range Status   Specimen Description BLOOD RIGHT ARM  Final   Special Requests BOTTLES DRAWN AEROBIC AND ANAEROBIC [facility] EACH  Final   Culture  Setup Time   Final    01/04/2014 04:44 Performed at [facility]    Culture   Final    ESCHERICHIA COLI Note: Confirmed Extended Spectrum  Beta-Lactamase Producer (ESBL) Note: Gram Stain Report Called to,Read Back By and Verified With: JENNIFER WALKER ON 01/04/2014 AT 8:20P BY WILEJ CRITICAL RESULT CALLED TO, READ BACK BY AND VERIFIED WITH: MARGE LESSARD 01/06/14 @ 9:48AM BY RUSCOE A. Performed at [facility]    Report Status 01/06/2014 FINAL  Final   Organism ID, Bacteria ESCHERICHIA COLI  Final      Susceptibility   Escherichia coli - MIC*    AMPICILLIN >=32 RESISTANT Resistant     AMPICILLIN/SULBACTAM 16 INTERMEDIATE Intermediate     CEFAZOLIN >=64 RESISTANT Resistant     CEFEPIME RESISTANT      CEFTAZIDIME RESISTANT      CEFTRIAXONE >=64 RESISTANT Resistant     CIPROFLOXACIN >=4 RESISTANT Resistant     GENTAMICIN <=1 SENSITIVE Sensitive     IMIPENEM <=0.25 SENSITIVE Sensitive     PIP/TAZO <=4 SENSITIVE Sensitive     TOBRAMYCIN <=1 SENSITIVE Sensitive     TRIMETH/SULFA <=20 SENSITIVE Sensitive     * ESCHERICHIA COLI  Urine culture     Status: None   Collection Time: 01/03/14 10:54 PM  Result Value Ref Range Status   Specimen Description URINE, CATHETERIZED  Final   Special Requests CX ADDED AT 0422 ON [id number]  Final   Culture  Setup Time   Final    01/04/2014 12:40 Performed at [facility]   Final    >=100,000 COLONIES/ML Performed at [facility]    Culture   Final    ESCHERICHIA COLI Note: Confirmed Extended Spectrum Beta-Lactamase Producer (ESBL) Performed at [facility]    Report Status 01/06/2014 FINAL  Final   Organism ID, Bacteria ESCHERICHIA COLI  Final      Susceptibility   Escherichia coli - MIC*    AMPICILLIN >=32 RESISTANT Resistant     CEFAZOLIN >=64 RESISTANT Resistant     CEFTRIAXONE >=64 RESISTANT Resistant     CIPROFLOXACIN >=4 RESISTANT Resistant     GENTAMICIN <=1 SENSITIVE Sensitive     LEVOFLOXACIN >=8 RESISTANT Resistant  NITROFURANTOIN <=16 SENSITIVE Sensitive     TOBRAMYCIN <=1 SENSITIVE Sensitive     TRIMETH/SULFA <=20  SENSITIVE Sensitive     IMIPENEM <=0.25 SENSITIVE Sensitive     PIP/TAZO <=4 SENSITIVE Sensitive     * ESCHERICHIA COLI  MRSA PCR Screening     Status: Abnormal   Collection Time: 01/04/14  2:15 AM  Result Value Ref Range Status   MRSA by PCR POSITIVE (A) NEGATIVE Final    Comment:        The GeneXpert MRSA Assay (FDA approved for NASAL specimens only), is one component of a comprehensive MRSA colonization surveillance program. It is not intended to diagnose MRSA infection nor to guide or monitor treatment for MRSA infections. RESULT CALLED TO, READ BACK BY AND VERIFIED WITH: PROPST,N RN [phone] AT [phone] SKEEN,P   Culture, blood (routine x 2)     Status: None (Preliminary result)   Collection Time: 01/05/14  7:50 AM  Result Value Ref Range Status   Specimen Description BLOOD LEFT HAND  Final   Special Requests BOTTLES DRAWN AEROBIC AND ANAEROBIC 10CC  Final   Culture  Setup Time   Final    01/05/2014 14:37 Performed at [facility]    Culture   Final           BLOOD CULTURE RECEIVED NO GROWTH TO DATE CULTURE WILL BE HELD FOR 5 DAYS BEFORE ISSUING A FINAL NEGATIVE REPORT Performed at [facility]    Report Status PENDING  Incomplete  Culture, blood (routine x 2)     Status: None (Preliminary result)   Collection Time: 01/05/14  8:00 AM  Result Value Ref Range Status   Specimen Description BLOOD RIGHT HAND  Final   Special Requests   Final    BOTTLES DRAWN AEROBIC AND ANAEROBIC 10CC BLUE 5CC RED   Culture  Setup Time   Final    01/05/2014 14:37 Performed at [facility]    Culture   Final           BLOOD CULTURE RECEIVED NO GROWTH TO DATE CULTURE WILL BE HELD FOR 5 DAYS BEFORE ISSUING A FINAL NEGATIVE REPORT Performed at [facility]    Report Status PENDING  Incomplete    RADIOLOGY STUDIES/RESULTS: Dg Chest 2 View  12/20/2013   CLINICAL DATA:  Weakness generalized.  UTI.  Fever.  EXAM: CHEST  2 VIEW  COMPARISON:  None.  FINDINGS: Left  dual lead cardiac pacemaker. Heart size is upper limits of normal. Lungs are clear without airspace disease or pulmonary edema. Degenerative changes in the thoracic spine. No definite pleural effusions.  IMPRESSION: No acute chest abnormality.   Electronically Signed   By: Markus Daft M.D.   On: 12/20/2013 01:11   Ct Head Wo Contrast  01/03/2014   CLINICAL DATA:  Altered mental status. Previous urinary tract infection. Pupils are unequal. No recent injury or fall.  EXAM: CT HEAD WITHOUT CONTRAST  TECHNIQUE: Contiguous axial images were obtained from the base of the skull through the vertex without intravenous contrast.  COMPARISON:  None.  FINDINGS: Diffuse cerebral atrophy. Mild ventricular dilatation consistent with central atrophy. Mild low attenuation change in the deep white matter likely due to small vessel ischemia. No mass effect or midline shift. No abnormal extra-axial fluid collections. Gray-white matter junctions are distinct. Basal cisterns are not effaced. No evidence of acute intracranial hemorrhage. No depressed skull fractures. Mild mucosal thickening in the paranasal sinuses. Mastoid air cells are not opacified.  IMPRESSION: No acute intracranial abnormalities. Chronic atrophy and small vessel ischemic changes.   Electronically Signed   By: Lucienne Capers M.D.   On: 01/03/2014 23:37   US Renal  12/20/2013   CLINICAL DATA:  Acute kidney injury. History of diabetes, hypertension, hyperlipidemia. Incontinence.  EXAM: RENAL/URINARY TRACT ULTRASOUND COMPLETE  COMPARISON:  None.  FINDINGS: Right Kidney:  Length: 11.7 cm. Echogenicity is normal. Large upper pole cyst is 10.8 x 8.1 x 10.4 cm. No hydronephrosis.  Left Kidney:  Length: 11.0 cm. Echogenicity within normal limits. No mass or hydronephrosis visualized.  Bladder:  Within the posterior aspect of the bladder there is echogenic debris. Prevoid volume is calculated to be 196 cc. Postvoid residual is 107 cc.  IMPRESSION: 1. Large right upper  pole cyst, 10.8 cm. 2. No hydronephrosis. 3. Significant postvoid residual calculated to be 107 cc. 4. Debris within the bladder.   Electronically Signed   By: Shon Hale M.D.   On: 12/20/2013 20:39   Dg Chest Port 1 View  01/03/2014   CLINICAL DATA:  Altered mental status, productive cough, fever, history diabetes, MI, hypertension, hyperlipidemia  EXAM: PORTABLE CHEST - 1 VIEW  COMPARISON:  Portable exam 2222 hr compared to 12/20/2013  FINDINGS: LEFT subclavian pacemaker leads project over RIGHT atrium and RIGHT ventricle.  Enlargement of cardiac silhouette.  Mediastinal contours and pulmonary vascularity normal.  Rotated to the RIGHT.  Lungs grossly clear.  No definite pleural effusion, pneumothorax or osseous abnormality.  IMPRESSION: Enlargement of cardiac silhouette post pacemaker.  No acute abnormalities.   Electronically Signed   By: Lavonia Dana M.D.   On: 01/03/2014 23:15    Oren Binet, MD  [facility] Pager:[phone]  If 7PM-7AM, please contact night-coverage [email or web address] Password [email or web address] 01/07/2014, 11:33 AM   LOS: 4 days

## 2014-01-07 NOTE — Progress Notes (Signed)
Patient refused lab draw this am. Abigail Marsiglia Joselita, RN 

## 2014-01-07 NOTE — Clinical Social Work Note (Signed)
CSW continues to follow this patient for d/c planning needs. CSW contacted patient's son [name] [phone]) and left message to follow-up. CSW to follow tomorrow.  [place], [place] Weekend Clinical Social Worker [phone]

## 2014-01-07 NOTE — Progress Notes (Signed)
Called emergency contact daughter [name] who the patient lives with at this time.  Advised pt was to get PICC line and needed consent for procedure.  Daughter stated that in fact her brother was the pt's POA and lives in [place]. Daughter has MS and is chair bound and waiting for caregiver to get back to get phone number out of her cell to call to get consent.  Last call caregiver has not returned yet but contact number for daughter [phone].

## 2014-01-07 NOTE — Clinical Social Work Note (Signed)
CSW contacted by patient's son [name]) and provided bed offers. Per [name], he would like to see if other facilities in [place] will provide an offer before making decision. [name] states he would like patient to be in [place], because it will be closer. CSW informed son, if other facilities have not offered a bed prior to d/c, patient may have to d/c to a facility that has provided a bed. Patient's son verbalized understanding and stated he wants to be contacted when other bed offers are made. CSW to follow tomorrow.  [place], [place] Weekend Clinical Social Worker [phone]

## 2014-01-07 NOTE — Progress Notes (Signed)
ANTICOAGULATION CONSULT NOTE - Follow Up Consult  Pharmacy Consult for Coumadin Indication: h/o DVT  No Known Allergies  Patient Measurements: Height: 5\' 7"  (170.2 cm) Weight: 216 lb 11.2 oz (98.294 kg) IBW/kg (Calculated) : 66.1  Vital Signs: Temp: 97.3 F (36.3 C) (11/22 1012) Temp Source: Oral (11/22 1012) BP: 133/59 mmHg (11/22 1012) Pulse Rate: 70 (11/22 1012)  Labs:  Recent Labs  01/04/14 1400 01/04/14 1848 01/05/14 0250 01/06/14 0338 01/07/14 0850  HGB  --   --  12.3* 10.6*  --   HCT  --   --  38.3* 33.1*  --   PLT  --   --  294 246  --   LABPROT  --   --  26.9* 25.0*  --   INR  --   --  2.46* 2.24*  --   CREATININE  --   --  1.95* 1.71* 1.52*  TROPONINI <0.30 <0.30  --   --   --     Estimated Creatinine Clearance: 38.3 mL/min (by C-G formula based on Cr of 1.52).  Assessment: Pt has h/o right leg DVT. Previously on Coumadin 5 mg daily.  Was at [facility] form 11/3-11/7/15. INRs were subtherapeutic during that admission (1.4-1.5), so Lovenox added during that admission, and continued on discharge 12/23/13.  Also on Plavix.  Patient's INR remains therapeutic at 2.91. Based on rapid increased in INR from 2.24 to 2.91 and reduced eating while inpatient, will reduce today's dose.   Goal of Therapy:  INR 2-3 Monitor platelets by anticoagulation protocol: Yes   Plan:  -Coumadin 5 mg tonight x1 -Daily PT/INR -Monitor for s/sx of bleeding  Andrey Cota. Diona Foley, PharmD Clinical Pharmacist Pager [phone] 01/07/2014,11:04 AM

## 2014-01-08 LAB — GLUCOSE, CAPILLARY
GLUCOSE-CAPILLARY: 130 mg/dL — AB (ref 70–99)
GLUCOSE-CAPILLARY: 153 mg/dL — AB (ref 70–99)
GLUCOSE-CAPILLARY: 135 mg/dL — AB (ref 70–99)
Glucose-Capillary: 170 mg/dL — ABNORMAL HIGH (ref 70–99)

## 2014-01-08 LAB — PROTIME-INR
INR: 3.59 — ABNORMAL HIGH (ref 0.00–1.49)
Prothrombin Time: 36.1 seconds — ABNORMAL HIGH (ref 11.6–15.2)

## 2014-01-08 MED ORDER — SODIUM CHLORIDE 0.9 % IV SOLN: 1.0000 g | INTRAVENOUS | Status: DC

## 2014-01-08 MED ORDER — SODIUM CHLORIDE 0.9 % IJ SOLN
10.0000 mL | INTRAMUSCULAR | Status: DC | PRN
  Administered 2014-01-09: 10 mL
  Filled 2014-01-08: qty 40

## 2014-01-08 MED ORDER — BUSPIRONE HCL 15 MG PO TABS: 15.0000 mg | ORAL_TABLET | Freq: Two times a day (BID) | ORAL | Status: DC

## 2014-01-08 NOTE — Progress Notes (Signed)
PHARMACY NOTE  Pharmacy Consult :  80 y.o. male is currently on chronic Coumadin for Hx DVT.   Dosing Wt :  97.4 kg  Hematology :  Recent Labs  01/06/14 0338 01/07/14 0850 01/07/14 1250 01/08/14 0531  HGB 10.6*  --   --   --   HCT 33.1*  --   --   --   PLT 246  --   --   --   LABPROT 25.0*  --  30.6* 36.1*  INR 2.24*  --  2.91* 3.59*  CREATININE 1.71* 1.52*  --   --     Current Medication[s] Include: Scheduled:  Scheduled:  . antiseptic oral rinse  7 mL Mouth Rinse BID  . atorvastatin  10 mg Oral Daily  . bacitracin   Topical BID  . busPIRone  15 mg Oral BID  . Chlorhexidine Gluconate Cloth  6 each Topical Q0600  . clopidogrel  75 mg Oral Daily  . famotidine  20 mg Oral Daily  . feeding supplement (ENSURE COMPLETE)  237 mL Oral BID BM  . imipenem-cilastatin  250 mg Intravenous 4 times per day  . insulin aspart  0-9 Units Subcutaneous TID WC  . insulin NPH Human  10 Units Subcutaneous BID  . mupirocin ointment  1 application Nasal BID  . Warfarin - Pharmacist Dosing Inpatient   Does not apply q1800   Assessment :  Today's INR 3.59.   INR is Supra-therapeutic.  No evidence of bleeding complications observed.  Goal :  INR goal is 2-3  Plan :  No Coumadin Today.    Next INR in AM with further Coumadin doses to be determined.  Ajai Terhaar, Craig Guess,  Pharm.D  01/08/2014  6:41 PM

## 2014-01-08 NOTE — Progress Notes (Signed)
Peripherally Inserted Central Catheter/Midline Placement  The IV Nurse has discussed with the patient and/or persons authorized to consent for the patient, the purpose of this procedure and the potential benefits and risks involved with this procedure.  The benefits include less needle sticks, lab draws from the catheter and patient may be discharged home with the catheter.  Risks include, but not limited to, infection, bleeding, blood clot (thrombus formation), and puncture of an artery; nerve damage and irregular heat beat.  Alternatives to this procedure were also discussed.  PICC/Midline Placement Documentation        Randy [name] 01/08/2014, 4:13 PM Consent was obtained by staff from son that is POA.

## 2014-01-08 NOTE — Plan of Care (Signed)
Problem: Phase II Progression Outcomes Goal: Progress activity as tolerated unless otherwise ordered Outcome: Adequate for Discharge Goal: Vital signs remain stable, temperature < 100 Outcome: Completed/Met Date Met:  01/08/14 Goal: Tolerating diet Outcome: Completed/Met Date Met:  01/08/14

## 2014-01-08 NOTE — Plan of Care (Signed)
Problem: Phase II Progression Outcomes Goal: Discharge plan established Outcome: Completed/Met Date Met:  01/08/14 Goal: Voiding independently Outcome: Completed/Met Date Met:  01/08/14 Goal: Other Phase II Outcomes/Goals Outcome: Not Applicable Date Met:  81/10/31

## 2014-01-08 NOTE — Discharge Summary (Addendum)
PATIENT DETAILS Name: Randy [name] Age: 80 y.o. Sex: male Date of Birth: May 12, 1924 MRN: [id number]. Admitting Physician: Ivor Costa, MD PCP:Pcp Not In System  Admit Date: 01/03/2014 Discharge date: 01/09/2014  Recommendations for Outpatient Follow-up:  1. Please continue IV Invanz for a total of 2 weeks from 01/06/14.  2. Please remove PICC line once Antibiotics are completed 3. Please follow blood cultures drawn on 11/24. 4. Please repeat blood cultures once patient completes Invanz to ensure clearing of bacteremia. 5. Repeat CBC/BMET weekly while on IV Invanz 6. Please repeat PT/INR on 11/25, and adjust Coumadin accordingly.  PRIMARY DISCHARGE DIAGNOSIS:  Principal Problem:   Acute encephalopathy Active Problems:   Hyperlipidemia   Hypertension   Anxiety   History of MI (myocardial infarction)   UTI (lower urinary tract infection)   Essential hypertension   Weakness generalized   History of DVT (deep vein thrombosis)   Diabetes mellitus   Sepsis      PAST MEDICAL HISTORY: Past Medical History  Diagnosis Date  . Diabetes mellitus without complication   . Anxiety   . MI (myocardial infarction)   . Hypertension   . Hyperlipidemia     DISCHARGE MEDICATIONS: Current Discharge Medication List    START taking these medications   Details  ertapenem 1 g in sodium chloride 0.9 % 50 mL Inject 1 g into the vein daily. For 14 days from 01/06/14. Please repeat blood cultures after completion of IV Invanz.      CONTINUE these medications which have CHANGED   Details  busPIRone (BUSPAR) 15 MG tablet Take 1 tablet (15 mg total) by mouth 2 (two) times daily. Qty: 30 tablet, Refills: 0    warfarin (COUMADIN) 7.5 MG tablet Take 1 tablet (7.5 mg total) by mouth one time only at 6 PM. Qty: 20 tablet, Refills: 0      CONTINUE these medications which have NOT CHANGED   Details  antiseptic oral rinse (CPC / CETYLPYRIDINIUM CHLORIDE 0.05%) 0.05 % LIQD solution 7 mLs by  Mouth Rinse route 2 (two) times daily. Qty: 44 mL, Refills: 0    atorvastatin (LIPITOR) 10 MG tablet Take 10 mg by mouth daily.    carvedilol (COREG) 3.125 MG tablet Take 3.125 mg by mouth 2 (two) times daily with a meal.    clopidogrel (PLAVIX) 75 MG tablet Take 75 mg by mouth daily.    Diaper Rash Products (DESITIN EX) Apply 1 application topically as needed.    feeding supplement, ENSURE COMPLETE, (ENSURE COMPLETE) LIQD Take 237 mLs by mouth 2 (two) times daily between meals. Qty: 237 mL, Refills: 0    insulin NPH Human (HUMULIN N,NOVOLIN N) 100 UNIT/ML injection Inject 14 Units into the skin 2 (two) times daily before a meal.    ranitidine (ZANTAC) 150 MG capsule Take 150 mg by mouth 2 (two) times daily.    Skin Protectants, Misc. (EUCERIN) cream Apply 1 application topically as needed for dry skin.    tamsulosin (FLOMAX) 0.4 MG CAPS capsule Take 1 capsule (0.4 mg total) by mouth daily. Qty: 30 capsule, Refills: 0      STOP taking these medications     acetaminophen-codeine (TYLENOL #3) 300-30 MG per tablet      enoxaparin (LOVENOX) 80 MG/0.8ML injection         ALLERGIES:  No Known Allergies  BRIEF HPI:  See H&P, Labs, Consult and Test reports for all details in brief, patient 80 year old male with history of coronary artery disease, diabetes and hypertension  who presented with altered mental status. He was found to have sepsis and UTI and admitted to the hospital for further evaluation and treatment.   CONSULTATIONS:   None  PERTINENT RADIOLOGIC STUDIES: Dg Chest 2 View  12/20/2013   CLINICAL DATA:  Weakness generalized.  UTI.  Fever.  EXAM: CHEST  2 VIEW  COMPARISON:  None.  FINDINGS: Left dual lead cardiac pacemaker. Heart size is upper limits of normal. Lungs are clear without airspace disease or pulmonary edema. Degenerative changes in the thoracic spine. No definite pleural effusions.  IMPRESSION: No acute chest abnormality.   Electronically Signed   By: Markus Daft M.D.   On: 12/20/2013 01:11   Ct Head Wo Contrast  01/03/2014   CLINICAL DATA:  Altered mental status. Previous urinary tract infection. Pupils are unequal. No recent injury or fall.  EXAM: CT HEAD WITHOUT CONTRAST  TECHNIQUE: Contiguous axial images were obtained from the base of the skull through the vertex without intravenous contrast.  COMPARISON:  None.  FINDINGS: Diffuse cerebral atrophy. Mild ventricular dilatation consistent with central atrophy. Mild low attenuation change in the deep white matter likely due to small vessel ischemia. No mass effect or midline shift. No abnormal extra-axial fluid collections. Gray-white matter junctions are distinct. Basal cisterns are not effaced. No evidence of acute intracranial hemorrhage. No depressed skull fractures. Mild mucosal thickening in the paranasal sinuses. Mastoid air cells are not opacified.  IMPRESSION: No acute intracranial abnormalities. Chronic atrophy and small vessel ischemic changes.   Electronically Signed   By: Lucienne Capers M.D.   On: 01/03/2014 23:37   US Renal  12/20/2013   CLINICAL DATA:  Acute kidney injury. History of diabetes, hypertension, hyperlipidemia. Incontinence.  EXAM: RENAL/URINARY TRACT ULTRASOUND COMPLETE  COMPARISON:  None.  FINDINGS: Right Kidney:  Length: 11.7 cm. Echogenicity is normal. Large upper pole cyst is 10.8 x 8.1 x 10.4 cm. No hydronephrosis.  Left Kidney:  Length: 11.0 cm. Echogenicity within normal limits. No mass or hydronephrosis visualized.  Bladder:  Within the posterior aspect of the bladder there is echogenic debris. Prevoid volume is calculated to be 196 cc. Postvoid residual is 107 cc.  IMPRESSION: 1. Large right upper pole cyst, 10.8 cm. 2. No hydronephrosis. 3. Significant postvoid residual calculated to be 107 cc. 4. Debris within the bladder.   Electronically Signed   By: Shon Hale M.D.   On: 12/20/2013 20:39   Dg Chest Port 1 View  01/03/2014   CLINICAL DATA:  Altered mental status,  productive cough, fever, history diabetes, MI, hypertension, hyperlipidemia  EXAM: PORTABLE CHEST - 1 VIEW  COMPARISON:  Portable exam 2222 hr compared to 12/20/2013  FINDINGS: LEFT subclavian pacemaker leads project over RIGHT atrium and RIGHT ventricle.  Enlargement of cardiac silhouette.  Mediastinal contours and pulmonary vascularity normal.  Rotated to the RIGHT.  Lungs grossly clear.  No definite pleural effusion, pneumothorax or osseous abnormality.  IMPRESSION: Enlargement of cardiac silhouette post pacemaker.  No acute abnormalities.   Electronically Signed   By: Lavonia Dana M.D.   On: 01/03/2014 23:15     PERTINENT LAB RESULTS: CBC: No results for input(s): WBC, HGB, HCT, PLT in the last 72 hours. CMET CMP     Component Value Date/Time   NA 136* 01/07/2014 0850   K 4.0 01/07/2014 0850   CL 101 01/07/2014 0850   CO2 22 01/07/2014 0850   GLUCOSE 169* 01/07/2014 0850   BUN 21 01/07/2014 0850   CREATININE 1.52* 01/07/2014 0850  CALCIUM 8.3* 01/07/2014 0850   PROT 6.9 01/04/2014 0550   ALBUMIN 2.9* 01/04/2014 0550   AST 15 01/04/2014 0550   ALT 19 01/04/2014 0550   ALKPHOS 68 01/04/2014 0550   BILITOT 0.6 01/04/2014 0550   GFRNONAA 39* 01/07/2014 0850   GFRAA 46* 01/07/2014 0850    GFR Estimated Creatinine Clearance: 38.1 mL/min (by C-G formula based on Cr of 1.52). No results for input(s): LIPASE, AMYLASE in the last 72 hours. No results for input(s): CKTOTAL, CKMB, CKMBINDEX, TROPONINI in the last 72 hours. Invalid input(s): POCBNP No results for input(s): DDIMER in the last 72 hours. No results for input(s): HGBA1C in the last 72 hours. No results for input(s): CHOL, HDL, LDLCALC, TRIG, CHOLHDL, LDLDIRECT in the last 72 hours. No results for input(s): TSH, T4TOTAL, T3FREE, THYROIDAB in the last 72 hours.  Invalid input(s): FREET3 No results for input(s): VITAMINB12, FOLATE, FERRITIN, TIBC, IRON, RETICCTPCT in the last 72 hours. Coags:  Recent Labs  01/08/14 0531  01/09/14 0541  INR 3.59* 3.59*   Microbiology: Recent Results (from the past 240 hour(s))  Culture, blood (routine x 2)     Status: None   Collection Time: 01/03/14 10:18 PM  Result Value Ref Range Status   Specimen Description BLOOD LEFT FOREARM  Final   Special Requests BOTTLES DRAWN AEROBIC AND ANAEROBIC [facility] EACH  Final   Culture  Setup Time   Final    01/04/2014 04:44 Performed at [facility]    Culture   Final    ESCHERICHIA COLI Note: SUSCEPTIBILITIES PERFORMED ON PREVIOUS CULTURE WITHIN THE LAST 5 DAYS. Confirmed Extended Spectrum Beta-Lactamase Producer (ESBL) CRITICAL RESULT CALLED TO, READ BACK BY AND VERIFIED WITH: MARGE LESSARD 01/06/14 @ 9:48AM BY RUSCOE A. Note: Gram Stain Report Called to,Read Back By and Verified With: MONICA LANE @ [phone] ON [phone] BY [facility] Performed at [facility]    Report Status 01/06/2014 FINAL  Final  Culture, blood (routine x 2)     Status: None   Collection Time: 01/03/14 10:20 PM  Result Value Ref Range Status   Specimen Description BLOOD RIGHT ARM  Final   Special Requests BOTTLES DRAWN AEROBIC AND ANAEROBIC [facility] EACH  Final   Culture  Setup Time   Final    01/04/2014 04:44 Performed at [facility]    Culture   Final    ESCHERICHIA COLI Note: Confirmed Extended Spectrum Beta-Lactamase Producer (ESBL) Note: Gram Stain Report Called to,Read Back By and Verified With: JENNIFER WALKER ON 01/04/2014 AT 8:20P BY WILEJ CRITICAL RESULT CALLED TO, READ BACK BY AND VERIFIED WITH: MARGE LESSARD 01/06/14 @ 9:48AM BY RUSCOE A. Performed at [facility]    Report Status 01/06/2014 FINAL  Final   Organism ID, Bacteria ESCHERICHIA COLI  Final      Susceptibility   Escherichia coli - MIC*    AMPICILLIN >=32 RESISTANT Resistant     AMPICILLIN/SULBACTAM 16 INTERMEDIATE Intermediate     CEFAZOLIN >=64 RESISTANT Resistant     CEFEPIME RESISTANT      CEFTAZIDIME RESISTANT      CEFTRIAXONE >=64 RESISTANT Resistant      CIPROFLOXACIN >=4 RESISTANT Resistant     GENTAMICIN <=1 SENSITIVE Sensitive     IMIPENEM <=0.25 SENSITIVE Sensitive     PIP/TAZO <=4 SENSITIVE Sensitive     TOBRAMYCIN <=1 SENSITIVE Sensitive     TRIMETH/SULFA <=20 SENSITIVE Sensitive     * ESCHERICHIA COLI  Urine culture     Status: None  Collection Time: 01/03/14 10:54 PM  Result Value Ref Range Status   Specimen Description URINE, CATHETERIZED  Final   Special Requests CX ADDED AT 0422 ON [id number]  Final   Culture  Setup Time   Final    01/04/2014 12:40 Performed at [place]   Final    >=100,000 COLONIES/ML Performed at [facility]    Culture   Final    ESCHERICHIA COLI Note: Confirmed Extended Spectrum Beta-Lactamase Producer (ESBL) Performed at [facility]    Report Status 01/06/2014 FINAL  Final   Organism ID, Bacteria ESCHERICHIA COLI  Final      Susceptibility   Escherichia coli - MIC*    AMPICILLIN >=32 RESISTANT Resistant     CEFAZOLIN >=64 RESISTANT Resistant     CEFTRIAXONE >=64 RESISTANT Resistant     CIPROFLOXACIN >=4 RESISTANT Resistant     GENTAMICIN <=1 SENSITIVE Sensitive     LEVOFLOXACIN >=8 RESISTANT Resistant     NITROFURANTOIN <=16 SENSITIVE Sensitive     TOBRAMYCIN <=1 SENSITIVE Sensitive     TRIMETH/SULFA <=20 SENSITIVE Sensitive     IMIPENEM <=0.25 SENSITIVE Sensitive     PIP/TAZO <=4 SENSITIVE Sensitive     * ESCHERICHIA COLI  MRSA PCR Screening     Status: Abnormal   Collection Time: 01/04/14  2:15 AM  Result Value Ref Range Status   MRSA by PCR POSITIVE (A) NEGATIVE Final    Comment:        The GeneXpert MRSA Assay (FDA approved for NASAL specimens only), is one component of a comprehensive MRSA colonization surveillance program. It is not intended to diagnose MRSA infection nor to guide or monitor treatment for MRSA infections. RESULT CALLED TO, READ BACK BY AND VERIFIED WITH: PROPST,N RN [phone] AT [phone] SKEEN,P   Culture, blood  (routine x 2)     Status: None (Preliminary result)   Collection Time: 01/05/14  7:50 AM  Result Value Ref Range Status   Specimen Description BLOOD LEFT HAND  Final   Special Requests BOTTLES DRAWN AEROBIC AND ANAEROBIC 10CC  Final   Culture  Setup Time   Final    01/05/2014 14:37 Performed at [place] Note: Gram Stain Report Called to,Read Back By and Verified With: CHERYL MARSHALL ON 01/08/2014 AT 11:30P BY WILEJ Performed at [facility]    Report Status PENDING  Incomplete  Culture, blood (routine x 2)     Status: None (Preliminary result)   Collection Time: 01/05/14  8:00 AM  Result Value Ref Range Status   Specimen Description BLOOD RIGHT HAND  Final   Special Requests   Final    BOTTLES DRAWN AEROBIC AND ANAEROBIC 10CC BLUE 5CC RED   Culture  Setup Time   Final    01/05/2014 14:37 Performed at [facility]    Culture   Final           BLOOD CULTURE RECEIVED NO GROWTH TO DATE CULTURE WILL BE HELD FOR 5 DAYS BEFORE ISSUING A FINAL NEGATIVE REPORT Performed at [facility]    Report Status PENDING  Incomplete     BRIEF HOSPITAL COURSE:  Acute encephalopathy:likely secondary to UTI/ESBL E. Coli Bacteremia. Resolved with Abx.CT head negative for significant intracranial abnormalities.  Active Problems: Sepsis:secondary to UTI/ESBL E Coli Bacteremia. Much better with IVF, IV Abx.Urine cs and Blood cs positive for ESBL E.Coli.Maintained  on Primaxin since 11/19, repeat blood cultures on 11/20 came back positive for gram-negative rods on 11/23 evening, case discussed with infectious disease Dr. Baxter Flattery over the phone who reviewed the chart. Retrospectively, it looks like repeat blood cultures on 11/20 was done a bit too early, patient only received 1 dose of IV Primaxin prior to these cultures being drawn. Patient is significantly improved, has defervesced and is without any fever. Dr. Baxter Flattery recommends to  continue with IV antibiotics for a total of 14 days, day one being 01/06/14. I would repeat another set of blood cultures on 11/24, these will need to be followed. Recommendations are to repeat another set of blood cultures once patient completes antibiotics. If repeat blood cultures are positive, suggest infectious disease evaluation at some point.  UTI: Febrile on admission. But non toxic appearance. Abx and Cultures as above.  E Coli Bacteremia: Blood cultures positive for ESBL Escherichia coli. Was on Primaxin from 11/19, see above for further details. Patient will be transitioned to [facility] on discharge.  Acute on chronic kidney disease stage III: Likely prerenal secondary to sepsis. Was treated IV fluids, creatinine peaked at 1.95, has now gone trended, last creatinine on 11/22 was 1.52. Please continue to follow renal function periodically.  History of DVT: On chronic Coumadin, INR slightly supra-therapeutic. Continue dosing of Coumadin-start on 11/25, and check INR 11/25 for further adjustment.   History of CAD: No chest pain. Continue Plavix, Lipitor. Resume beta blocker on discharge.   Hyperlipidemia: continue statin  Hypertension: Initially blood pressures was soft, hence all antihypertensive medications were held. BP no more stable, we'll resume Coreg on discharge.  Diabetes: CBGs stable, continue NPH. Follow CBGs and titrate accordingly. Recent A1c on 11/47.7.  Anxiety: Continue BuSpar.  Deconditioning/generalized weakness: Secondary to sepsis, recent illness.PT evaluation completed-recommendations are for SNF. Patient and son-Eric agreeable.   TODAY-DAY OF DISCHARGE:  Subjective:   Meziah Blasingame today has no headache,no chest abdominal pain,no new weakness tingling or numbness, feels much better wants to go home today.   Objective:   Blood pressure 141/66, pulse 70, temperature 97.8 F (36.6 C), temperature source Oral, resp. rate 18, height 5\' 7"  (1.702 m), weight 97.5  kg (214 lb 15.2 oz), SpO2 98 %.  Intake/Output Summary (Last 24 hours) at 01/09/14 0948 Last data filed at 01/09/14 0600  Gross per 24 hour  Intake    360 ml  Output   2150 ml  Net  -1790 ml   Filed Weights   01/06/14 2038 01/07/14 2117 01/08/14 2015  Weight: 98.294 kg (216 lb 11.2 oz) 97.4 kg (214 lb 11.7 oz) 97.5 kg (214 lb 15.2 oz)    Exam Awake Alert, Oriented *3, No new F.N deficits, Normal affect Zap.AT,PERRAL Supple Neck,No JVD, No cervical lymphadenopathy appriciated.  Symmetrical Chest wall movement, Good air movement bilaterally, CTAB RRR,No Gallops,Rubs or new Murmurs, No Parasternal Heave +ve B.Sounds, Abd Soft, Non tender, No organomegaly appriciated, No rebound -guarding or rigidity. No Cyanosis, Clubbing or edema, No new Rash or bruise  DISCHARGE CONDITION: Stable  DISPOSITION: SNF  DISCHARGE INSTRUCTIONS:    Activity:  As tolerated with Full fall precautions use walker/cane & assistance as needed  Diet recommendation: Diabetic Diet Heart Healthy diet   Discharge Instructions    Call MD for:  redness, tenderness, or signs of infection (pain, swelling, redness, odor or green/yellow discharge around incision site)    Complete by:  As directed      Diet - low sodium heart healthy  Complete by:  As directed      Diet Carb Modified    Complete by:  As directed      Increase activity slowly    Complete by:  As directed            Follow-up Information    Follow up with PCP. Schedule an appointment as soon as possible for a visit in 1 week.   Contact information:   PCP in [place]      Total Time spent on discharge equals 45 minutes.  SignedOren Binet 01/09/2014 9:48 AM

## 2014-01-08 NOTE — Progress Notes (Signed)
PT Cancellation Note  Patient Details Name: Randy [name] MRN: [id number] DOB: 1924/09/17   Cancelled Treatment:    Reason Eval/Treat Not Completed: Other (comment), pt with plans for d/c to SNF today, defer PT to SNF setting.   [place], [place] 01/08/2014, 2:13 PM

## 2014-01-08 NOTE — Plan of Care (Signed)
Problem: Phase I Progression Outcomes Goal: Other Phase I Outcomes/Goals Outcome: Not Applicable Date Met:  88/89/16

## 2014-01-08 NOTE — Plan of Care (Signed)
Problem: Consults Goal: UTI/Pyelonephritis Patient Education See Patient Education Module for education specifics.  Outcome: Completed/Met Date Met:  01/08/14 Goal: Nutrition Consult-if indicated Outcome: Completed/Met Date Met:  01/08/14  Problem: Phase II Progression Outcomes Goal: Progress activity as tolerated unless otherwise ordered Outcome: Adequate for Discharge  Problem: Phase III Progression Outcomes Goal: Pain controlled on oral analgesia Outcome: Completed/Met Date Met:  01/08/14 Goal: Activity at appropriate level-compared to baseline (UP IN CHAIR FOR HEMODIALYSIS)  Outcome: Not Applicable Date Met:  40/37/54 Goal: Afebrile, Vital Signs remain stable Outcome: Completed/Met Date Met:  01/08/14 Goal: Tolerating diet Outcome: Completed/Met Date Met:  01/08/14 Goal: IV Medications to PO Outcome: Completed/Met Date Met:  01/08/14 Goal: Discharge plan remains appropriate-arrangements made Outcome: Completed/Met Date Met:  01/08/14 Goal: Other Phase III Outcomes/Goals Outcome: Not Applicable Date Met:  36/06/77  Problem: Discharge Progression Outcomes Goal: Barriers To Progression Addressed/Resolved Outcome: Completed/Met Date Met:  01/08/14 Goal: Discharge plan in place and appropriate Outcome: Completed/Met Date Met:  01/08/14 Goal: Pain controlled with appropriate interventions Outcome: Completed/Met Date Met:  01/08/14 Goal: Hemodynamically stable Outcome: Completed/Met Date Met:  03/40/35 Goal: Complications resolved/controlled Outcome: Completed/Met Date Met:  01/08/14 Goal: Tolerating diet Outcome: Completed/Met Date Met:  01/08/14 Goal: Activity appropriate for discharge plan Outcome: Adequate for Discharge Goal: Temperature < 100 x 24 hrs on PO antibiotics Outcome: Completed/Met Date Met:  01/08/14 Goal: Other Discharge Outcomes/Goals Outcome: Not Applicable Date Met:  24/81/85

## 2014-01-08 NOTE — Progress Notes (Signed)
Patient not going to SNF today, paper work needs to be finalized by patient's son.  [facility] cannot accept the patient without the paper work per Education officer, museum.  Dr. Oren Binet paged about the situation.

## 2014-01-09 LAB — GLUCOSE, CAPILLARY
GLUCOSE-CAPILLARY: 180 mg/dL — AB (ref 70–99)
GLUCOSE-CAPILLARY: 211 mg/dL — AB (ref 70–99)
Glucose-Capillary: 137 mg/dL — ABNORMAL HIGH (ref 70–99)
Glucose-Capillary: 136 mg/dL — ABNORMAL HIGH (ref 70–99)

## 2014-01-09 LAB — PROTIME-INR
INR: 3.59 — ABNORMAL HIGH (ref 0.00–1.49)
Prothrombin Time: 36.1 seconds — ABNORMAL HIGH (ref 11.6–15.2)

## 2014-01-09 MED ORDER — HEPARIN SOD (PORK) LOCK FLUSH 100 UNIT/ML IV SOLN
250.0000 [IU] | Freq: Every day | INTRAVENOUS | Status: DC
  Filled 2014-01-09: qty 3

## 2014-01-09 MED ORDER — HEPARIN SOD (PORK) LOCK FLUSH 100 UNIT/ML IV SOLN
250.0000 [IU] | INTRAVENOUS | Status: DC | PRN
  Administered 2014-01-09: 250 [IU]
  Filled 2014-01-09: qty 3

## 2014-01-09 MED ORDER — WARFARIN SODIUM 7.5 MG PO TABS: 7.5000 mg | ORAL_TABLET | Freq: Once | ORAL | Status: DC

## 2014-01-09 MED ORDER — SODIUM CHLORIDE 0.9 % IV SOLN: 1.0000 g | INTRAVENOUS | Status: DC

## 2014-01-09 NOTE — Progress Notes (Signed)
PATIENT DETAILS Name: Randy [name] Age: 80 y.o. Sex: male Date of Birth: Aug 18, 1924 Admit Date: 01/03/2014 Admitting Physician Ivor Costa, MD PCP:Pcp Not In System  Subjective: Awake and alert today. No major complaints.Remains afebrile  Assessment/Plan: Principal Problem:  Acute encephalopathy:likely secondary to UTI/ESBL E. Coli Bacteremia. Improved with Abx.CT head negative for significant intracranial abnormalities.  Active Problems: Sepsis:secondary to UTI/ESBL E Coli Bacteremia. Much better with IVF, IV Abx.Urine cs and Blood cs positive for ESBL E Coli.Maintained on Primaxin, repeat blood cultures on 11/20 came back positive on 11/23 pm. Suspect repeat blood cultures done to early, after review of chart looks like patient only got one dose of Primaxin before repeat blood cultures. Patient improved, afebrile. Spoke with Dr Baxter Flattery who reviewed chart and recommended that we continue with IV Invanz/Primaxin for 14 days-day 1 will now be 01/06/14. Will repeat cultures today, and will need repeat cultures once completes antibiotic treatment.  UTI: Febrile on admission. But non toxic appearance. Abx and Cultures as above.  E Coli Bacteremia: continue IV Abx. See above  Acute renal failure: Likely prerenal secondary to sepsis. Continue IV fluids, follow renal function-creatinine now down trending.  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 Pharmacy managing Coumadin.  History of CAD: No chest pain. Continue Plavix, Lipitor. Resume beta blocker when blood pressure more stable.  Hyperlipidemia: continue statin  Hypertension: BP controlled without any anti-hypertensives, holding all antihypertensives for now. Resume when able  Diabetes: CBGs stable, continue NPH,and SSI. Follow CBGs and titrate accordingly. Recent A1c on 11/47.7.  Anxiety: Continue BuSpar.  Deconditioning/generalized weakness: Secondary to sepsis, recent illness.PT evaluation  completed-recommendations are for SNF  Disposition: Remain inpatient-SNF 11/24 with PICC line.  Antibiotics:  Rocephin 11/18>>11/19  Primaxin 11/19>>   Anti-infectives    Start     Dose/Rate Route Frequency Ordered Stop   01/08/14 0000  ertapenem 1 g in sodium chloride 0.9 % 50 mL     1 g100 mL/hr over 30 Minutes Intravenous Every 24 hours 01/08/14 0805     01/04/14 2130  imipenem-cilastatin (PRIMAXIN) 250 mg in sodium chloride 0.9 % 100 mL IVPB     250 mg200 mL/hr over 30 Minutes Intravenous 4 times per day 01/04/14 2110     01/04/14 0100  cefTRIAXone (ROCEPHIN) 1 g in dextrose 5 % 50 mL IVPB  Status:  Discontinued     1 g100 mL/hr over 30 Minutes Intravenous Every 24 hours 01/04/14 0019 01/04/14 2100   01/04/14 0000  cefTRIAXone (ROCEPHIN) 2 g in dextrose 5 % 50 mL IVPB  Status:  Discontinued     2 g100 mL/hr over 30 Minutes Intravenous  Once 01/03/14 2353 01/04/14 0022      DVT Prophylaxis: On coumadin  Code Status: Full code   Family Communication Luna Fuse the phone on 11/20 Efrain Clauson -[id number] 11/20  Procedures:  None  CONSULTS:  None  MEDICATIONS: Scheduled Meds: . antiseptic oral rinse  7 mL Mouth Rinse BID  . atorvastatin  10 mg Oral Daily  . bacitracin   Topical BID  . busPIRone  15 mg Oral BID  . clopidogrel  75 mg Oral Daily  . famotidine  20 mg Oral Daily  . feeding supplement (ENSURE COMPLETE)  237 mL Oral BID BM  . imipenem-cilastatin  250 mg Intravenous 4 times per day  . insulin aspart  0-9 Units Subcutaneous TID WC  . insulin NPH Human  10 Units Subcutaneous BID  . mupirocin  ointment  1 application Nasal BID  . Warfarin - Pharmacist Dosing Inpatient   Does not apply q1800   Continuous Infusions:   PRN Meds:.acetaminophen, liver oil-zinc oxide, sodium chloride    PHYSICAL EXAM: Vital signs in last 24 hours: Filed Vitals:   01/08/14 1803 01/08/14 2015 01/09/14 0600 01/09/14 0813  BP: 138/68  131/46 133/75 141/66  Pulse: 76 68 77 70  Temp: 98.7 F (37.1 C) 97.9 F (36.6 C) 98 F (36.7 C) 97.8 F (36.6 C)  TempSrc: Oral Oral Oral Oral  Resp: 18 17 17 18   Height:  5\' 7"  (1.702 m)    Weight:  97.5 kg (214 lb 15.2 oz)    SpO2: 99% 96% 96% 98%    Weight change: 0.1 kg (3.5 oz) Filed Weights   01/06/14 2038 01/07/14 2117 01/08/14 2015  Weight: 98.294 kg (216 lb 11.2 oz) 97.4 kg (214 lb 11.7 oz) 97.5 kg (214 lb 15.2 oz)   Body mass index is 33.66 kg/(m^2).   Gen Exam: Awake and alert. Neck: Supple, No JVD.   Chest: B/L Clear.  No rales or rhonchi CVS: S1 S2 Regular, no murmurs.  Abdomen: soft, BS +, non tender, non distended.  Extremities: no edema, lower extremities warm to touch. Neurologic: Non Focal-but with gen weakness Skin: No Rash.   Wounds: N/A.   Intake/Output from previous day:  Intake/Output Summary (Last 24 hours) at 01/09/14 0934 Last data filed at 01/09/14 0600  Gross per 24 hour  Intake    360 ml  Output   2150 ml  Net  -1790 ml     LAB RESULTS: CBC  Recent Labs Lab 01/03/14 2220 01/04/14 0550 01/05/14 0250 01/06/14 0338  WBC 10.6* 15.9* 8.0 6.0  HGB 12.8* 11.7* 12.3* 10.6*  HCT 39.9 37.2* 38.3* 33.1*  PLT 298 326 294 246  MCV 84.4 84.7 83.6 83.4  MCH 27.1 26.7 26.9 26.7  MCHC 32.1 31.5 32.1 32.0  RDW 16.3* 16.5* 16.5* 16.5*  LYMPHSABS 0.8  --   --   --   MONOABS 0.6  --   --   --   EOSABS 0.1  --   --   --   BASOSABS 0.0  --   --   --     Chemistries   Recent Labs Lab 01/03/14 2220 01/04/14 0550 01/05/14 0250 01/06/14 0338 01/07/14 0850  NA 135* 141 139 141 136*  K 4.9 4.8 4.6 4.1 4.0  CL 98 103 102 106 101  CO2 21 24 23 21 22   GLUCOSE 215* 234* 163* 174* 169*  BUN 22 27* 29* 27* 21  CREATININE 1.47* 1.94* 1.95* 1.71* 1.52*  CALCIUM 9.6 9.2 8.8 8.0* 8.3*    CBG:  Recent Labs Lab 01/08/14 0754 01/08/14 1128 01/08/14 1737 01/08/14 2050 01/09/14 0807  GLUCAP 130* 153* 135* 170* 136*    GFR Estimated  Creatinine Clearance: 38.1 mL/min (by C-G formula based on Cr of 1.52).  Coagulation profile  Recent Labs Lab 01/05/14 0250 01/06/14 0338 01/07/14 1250 01/08/14 0531 01/09/14 0541  INR 2.46* 2.24* 2.91* 3.59* 3.59*    Cardiac Enzymes  Recent Labs Lab 01/04/14 0550 01/04/14 1400 01/04/14 1848  TROPONINI <0.30 <0.30 <0.30    Invalid input(s): POCBNP No results for input(s): DDIMER in the last 72 hours. No results for input(s): HGBA1C in the last 72 hours. No results for input(s): CHOL, HDL, LDLCALC, TRIG, CHOLHDL, LDLDIRECT in the last 72 hours. No results for input(s): TSH, T4TOTAL, T3FREE, THYROIDAB in  the last 72 hours.  Invalid input(s): FREET3 No results for input(s): VITAMINB12, FOLATE, FERRITIN, TIBC, IRON, RETICCTPCT in the last 72 hours. No results for input(s): LIPASE, AMYLASE in the last 72 hours.  Urine Studies No results for input(s): UHGB, CRYS in the last 72 hours.  Invalid input(s): UACOL, UAPR, USPG, UPH, UTP, UGL, UKET, UBIL, UNIT, UROB, ULEU, UEPI, UWBC, URBC, UBAC, CAST, UCOM, BILUA  MICROBIOLOGY: Recent Results (from the past 240 hour(s))  Culture, blood (routine x 2)     Status: None   Collection Time: 01/03/14 10:18 PM  Result Value Ref Range Status   Specimen Description BLOOD LEFT FOREARM  Final   Special Requests BOTTLES DRAWN AEROBIC AND ANAEROBIC 5CC EACH  Final   Culture  Setup Time   Final    01/04/2014 04:44 Performed at [facility]    Culture   Final    ESCHERICHIA COLI Note: SUSCEPTIBILITIES PERFORMED ON PREVIOUS CULTURE WITHIN THE LAST 5 DAYS. Confirmed Extended Spectrum Beta-Lactamase Producer (ESBL) CRITICAL RESULT CALLED TO, READ BACK BY AND VERIFIED WITH: MARGE LESSARD 01/06/14 @ 9:48AM BY RUSCOE A. Note: Gram Stain Report Called to,Read Back By and Verified With: MONICA LANE @ 2585 ON [phone] BY [facility] Performed at [facility]    Report Status 01/06/2014 FINAL  Final  Culture, blood (routine x 2)     Status:  None   Collection Time: 01/03/14 10:20 PM  Result Value Ref Range Status   Specimen Description BLOOD RIGHT ARM  Final   Special Requests BOTTLES DRAWN AEROBIC AND ANAEROBIC [facility] EACH  Final   Culture  Setup Time   Final    01/04/2014 04:44 Performed at [facility]    Culture   Final    ESCHERICHIA COLI Note: Confirmed Extended Spectrum Beta-Lactamase Producer (ESBL) Note: Gram Stain Report Called to,Read Back By and Verified With: JENNIFER WALKER ON 01/04/2014 AT 8:20P BY WILEJ CRITICAL RESULT CALLED TO, READ BACK BY AND VERIFIED WITH: MARGE LESSARD 01/06/14 @ 9:48AM BY RUSCOE A. Performed at [facility]    Report Status 01/06/2014 FINAL  Final   Organism ID, Bacteria ESCHERICHIA COLI  Final      Susceptibility   Escherichia coli - MIC*    AMPICILLIN >=32 RESISTANT Resistant     AMPICILLIN/SULBACTAM 16 INTERMEDIATE Intermediate     CEFAZOLIN >=64 RESISTANT Resistant     CEFEPIME RESISTANT      CEFTAZIDIME RESISTANT      CEFTRIAXONE >=64 RESISTANT Resistant     CIPROFLOXACIN >=4 RESISTANT Resistant     GENTAMICIN <=1 SENSITIVE Sensitive     IMIPENEM <=0.25 SENSITIVE Sensitive     PIP/TAZO <=4 SENSITIVE Sensitive     TOBRAMYCIN <=1 SENSITIVE Sensitive     TRIMETH/SULFA <=20 SENSITIVE Sensitive     * ESCHERICHIA COLI  Urine culture     Status: None   Collection Time: 01/03/14 10:54 PM  Result Value Ref Range Status   Specimen Description URINE, CATHETERIZED  Final   Special Requests CX ADDED AT 0422 ON [id number]  Final   Culture  Setup Time   Final    01/04/2014 12:40 Performed at [place]   Final    >=100,000 COLONIES/ML Performed at [facility]    Culture   Final    ESCHERICHIA COLI Note: Confirmed Extended Spectrum Beta-Lactamase Producer (ESBL) Performed at [facility]    Report Status 01/06/2014 FINAL  Final   Organism ID, Bacteria ESCHERICHIA  COLI  Final      Susceptibility   Escherichia coli - MIC*      AMPICILLIN >=32 RESISTANT Resistant     CEFAZOLIN >=64 RESISTANT Resistant     CEFTRIAXONE >=64 RESISTANT Resistant     CIPROFLOXACIN >=4 RESISTANT Resistant     GENTAMICIN <=1 SENSITIVE Sensitive     LEVOFLOXACIN >=8 RESISTANT Resistant     NITROFURANTOIN <=16 SENSITIVE Sensitive     TOBRAMYCIN <=1 SENSITIVE Sensitive     TRIMETH/SULFA <=20 SENSITIVE Sensitive     IMIPENEM <=0.25 SENSITIVE Sensitive     PIP/TAZO <=4 SENSITIVE Sensitive     * ESCHERICHIA COLI  MRSA PCR Screening     Status: Abnormal   Collection Time: 01/04/14  2:15 AM  Result Value Ref Range Status   MRSA by PCR POSITIVE (A) NEGATIVE Final    Comment:        The GeneXpert MRSA Assay (FDA approved for NASAL specimens only), is one component of a comprehensive MRSA colonization surveillance program. It is not intended to diagnose MRSA infection nor to guide or monitor treatment for MRSA infections. RESULT CALLED TO, READ BACK BY AND VERIFIED WITH: PROPST,N RN [phone] AT [phone] SKEEN,P   Culture, blood (routine x 2)     Status: None (Preliminary result)   Collection Time: 01/05/14  7:50 AM  Result Value Ref Range Status   Specimen Description BLOOD LEFT HAND  Final   Special Requests BOTTLES DRAWN AEROBIC AND ANAEROBIC 10CC  Final   Culture  Setup Time   Final    01/05/2014 14:37 Performed at [place] Note: Gram Stain Report Called to,Read Back By and Verified With: CHERYL MARSHALL ON 01/08/2014 AT 11:30P BY WILEJ Performed at [facility]    Report Status PENDING  Incomplete  Culture, blood (routine x 2)     Status: None (Preliminary result)   Collection Time: 01/05/14  8:00 AM  Result Value Ref Range Status   Specimen Description BLOOD RIGHT HAND  Final   Special Requests   Final    BOTTLES DRAWN AEROBIC AND ANAEROBIC 10CC BLUE 5CC RED   Culture  Setup Time   Final    01/05/2014 14:37 Performed at [facility]    Culture    Final           BLOOD CULTURE RECEIVED NO GROWTH TO DATE CULTURE WILL BE HELD FOR 5 DAYS BEFORE ISSUING A FINAL NEGATIVE REPORT Performed at [facility]    Report Status PENDING  Incomplete    RADIOLOGY STUDIES/RESULTS: Dg Chest 2 View  12/20/2013   CLINICAL DATA:  Weakness generalized.  UTI.  Fever.  EXAM: CHEST  2 VIEW  COMPARISON:  None.  FINDINGS: Left dual lead cardiac pacemaker. Heart size is upper limits of normal. Lungs are clear without airspace disease or pulmonary edema. Degenerative changes in the thoracic spine. No definite pleural effusions.  IMPRESSION: No acute chest abnormality.   Electronically Signed   By: Markus Daft M.D.   On: 12/20/2013 01:11   Ct Head Wo Contrast  01/03/2014   CLINICAL DATA:  Altered mental status. Previous urinary tract infection. Pupils are unequal. No recent injury or fall.  EXAM: CT HEAD WITHOUT CONTRAST  TECHNIQUE: Contiguous axial images were obtained from the base of the skull through the vertex without intravenous contrast.  COMPARISON:  None.  FINDINGS: Diffuse cerebral atrophy. Mild ventricular dilatation consistent  with central atrophy. Mild low attenuation change in the deep white matter likely due to small vessel ischemia. No mass effect or midline shift. No abnormal extra-axial fluid collections. Gray-white matter junctions are distinct. Basal cisterns are not effaced. No evidence of acute intracranial hemorrhage. No depressed skull fractures. Mild mucosal thickening in the paranasal sinuses. Mastoid air cells are not opacified.  IMPRESSION: No acute intracranial abnormalities. Chronic atrophy and small vessel ischemic changes.   Electronically Signed   By: Lucienne Capers M.D.   On: 01/03/2014 23:37   US Renal  12/20/2013   CLINICAL DATA:  Acute kidney injury. History of diabetes, hypertension, hyperlipidemia. Incontinence.  EXAM: RENAL/URINARY TRACT ULTRASOUND COMPLETE  COMPARISON:  None.  FINDINGS: Right Kidney:  Length: 11.7 cm.  Echogenicity is normal. Large upper pole cyst is 10.8 x 8.1 x 10.4 cm. No hydronephrosis.  Left Kidney:  Length: 11.0 cm. Echogenicity within normal limits. No mass or hydronephrosis visualized.  Bladder:  Within the posterior aspect of the bladder there is echogenic debris. Prevoid volume is calculated to be 196 cc. Postvoid residual is 107 cc.  IMPRESSION: 1. Large right upper pole cyst, 10.8 cm. 2. No hydronephrosis. 3. Significant postvoid residual calculated to be 107 cc. 4. Debris within the bladder.   Electronically Signed   By: Shon Hale M.D.   On: 12/20/2013 20:39   Dg Chest Port 1 View  01/03/2014   CLINICAL DATA:  Altered mental status, productive cough, fever, history diabetes, MI, hypertension, hyperlipidemia  EXAM: PORTABLE CHEST - 1 VIEW  COMPARISON:  Portable exam 2222 hr compared to 12/20/2013  FINDINGS: LEFT subclavian pacemaker leads project over RIGHT atrium and RIGHT ventricle.  Enlargement of cardiac silhouette.  Mediastinal contours and pulmonary vascularity normal.  Rotated to the RIGHT.  Lungs grossly clear.  No definite pleural effusion, pneumothorax or osseous abnormality.  IMPRESSION: Enlargement of cardiac silhouette post pacemaker.  No acute abnormalities.   Electronically Signed   By: Lavonia Dana M.D.   On: 01/03/2014 23:15    Oren Binet, MD  [facility] Pager:[phone]  If 7PM-7AM, please contact night-coverage [email or web address] Password [facility] 01/09/2014, 9:34 AM   LOS: 6 days

## 2014-01-09 NOTE — Progress Notes (Signed)
Called Report to [employer], Elise Benne

## 2014-01-09 NOTE — Progress Notes (Signed)
ANTICOAGULATION CONSULT NOTE - Follow Up Consult  Pharmacy Consult for coumadin Indication: hx of DVT  No Known Allergies  Patient Measurements: Height: 5\' 7"  (170.2 cm) Weight: 214 lb 15.2 oz (97.5 kg) IBW/kg (Calculated) : 66.1 Heparin Dosing Weight:   Vital Signs: Temp: 97.8 F (36.6 C) (11/24 0813) Temp Source: Oral (11/24 0813) BP: 141/66 mmHg (11/24 0813) Pulse Rate: 70 (11/24 0813)  Labs:  Recent Labs  01/07/14 0850 01/07/14 1250 01/08/14 0531 01/09/14 0541  LABPROT  --  30.6* 36.1* 36.1*  INR  --  2.91* 3.59* 3.59*  CREATININE 1.52*  --   --   --     Estimated Creatinine Clearance: 38.1 mL/min (by C-G formula based on Cr of 1.52).   Medications:  Scheduled:  . antiseptic oral rinse  7 mL Mouth Rinse BID  . atorvastatin  10 mg Oral Daily  . bacitracin   Topical BID  . busPIRone  15 mg Oral BID  . clopidogrel  75 mg Oral Daily  . famotidine  20 mg Oral Daily  . feeding supplement (ENSURE COMPLETE)  237 mL Oral BID BM  . imipenem-cilastatin  250 mg Intravenous 4 times per day  . insulin aspart  0-9 Units Subcutaneous TID WC  . insulin NPH Human  10 Units Subcutaneous BID  . mupirocin ointment  1 application Nasal BID  . Warfarin - Pharmacist Dosing Inpatient   Does not apply q1800   Infusions:    Assessment: 80 yo male with hx of DVT is currently on supratherapeutic coumadin.  INR today is stable at 3.59. Goal of Therapy:  INR 2-3 Monitor platelets by anticoagulation protocol: Yes   Plan:  - no coumadin tonight. INR in am  Kade Demicco, Tsz-Yin 01/09/2014,8:30 AM

## 2014-01-09 NOTE — Progress Notes (Signed)
Pt discharged to [facility] in [place].  Pt left floor via wheelchair accompanied by caregiver and staff. Pleasant. Denies pain.

## 2014-01-11 LAB — CULTURE, BLOOD (ROUTINE X 2): Culture: NO GROWTH

## 2014-01-15 LAB — CULTURE, BLOOD (ROUTINE X 2)
Culture: NO GROWTH
Culture: NO GROWTH

## 2014-02-15 ENCOUNTER — Other Ambulatory Visit: Payer: Self-pay | Admitting: Internal Medicine

## 2014-03-10 ENCOUNTER — Emergency Department (HOSPITAL_COMMUNITY)
Admission: EM | Admit: 2014-03-10 | Discharge: 2014-03-10 | Disposition: A | Discharge status: Home / Self Care | Payer: Medicare Other | Source: Home / Self Care | Attending: Emergency Medicine | Admitting: Emergency Medicine

## 2014-03-10 ENCOUNTER — Encounter (HOSPITAL_COMMUNITY): Payer: Self-pay | Admitting: Emergency Medicine

## 2014-03-10 ENCOUNTER — Emergency Department (HOSPITAL_COMMUNITY): Payer: Medicare Other

## 2014-03-10 DIAGNOSIS — N390 Urinary tract infection, site not specified: Secondary | ICD-10-CM | POA: Insufficient documentation

## 2014-03-10 DIAGNOSIS — I10 Essential (primary) hypertension: Secondary | ICD-10-CM | POA: Insufficient documentation

## 2014-03-10 DIAGNOSIS — E785 Hyperlipidemia, unspecified: Secondary | ICD-10-CM | POA: Insufficient documentation

## 2014-03-10 DIAGNOSIS — R410 Disorientation, unspecified: Secondary | ICD-10-CM

## 2014-03-10 DIAGNOSIS — Z79899 Other long term (current) drug therapy: Secondary | ICD-10-CM | POA: Insufficient documentation

## 2014-03-10 DIAGNOSIS — A4150 Gram-negative sepsis, unspecified: Secondary | ICD-10-CM | POA: Diagnosis not present

## 2014-03-10 DIAGNOSIS — Z7902 Long term (current) use of antithrombotics/antiplatelets: Secondary | ICD-10-CM | POA: Insufficient documentation

## 2014-03-10 DIAGNOSIS — I252 Old myocardial infarction: Secondary | ICD-10-CM | POA: Insufficient documentation

## 2014-03-10 DIAGNOSIS — E119 Type 2 diabetes mellitus without complications: Secondary | ICD-10-CM | POA: Insufficient documentation

## 2014-03-10 DIAGNOSIS — Z794 Long term (current) use of insulin: Secondary | ICD-10-CM | POA: Insufficient documentation

## 2014-03-10 DIAGNOSIS — Z7901 Long term (current) use of anticoagulants: Secondary | ICD-10-CM | POA: Insufficient documentation

## 2014-03-10 LAB — BASIC METABOLIC PANEL
Anion gap: 7 (ref 5–15)
BUN: 23 mg/dL (ref 6–23)
CO2: 25 mmol/L (ref 19–32)
Calcium: 9 mg/dL (ref 8.4–10.5)
Chloride: 104 mmol/L (ref 96–112)
Creatinine, Ser: 1.66 mg/dL — ABNORMAL HIGH (ref 0.50–1.35)
GFR calc Af Amer: 41 mL/min — ABNORMAL LOW (ref >90)
GFR calc non Af Amer: 35 mL/min — ABNORMAL LOW (ref >90)
Glucose, Bld: 172 mg/dL — ABNORMAL HIGH (ref 70–99)
Potassium: 4.2 mmol/L (ref 3.5–5.1)
Sodium: 136 mmol/L (ref 135–145)

## 2014-03-10 LAB — CBC WITH DIFFERENTIAL/PLATELET
Basophils Absolute: 0 10*3/uL (ref 0.0–0.1)
Basophils Relative: 1 % (ref 0–1)
Eosinophils Absolute: 0 10*3/uL (ref 0.0–0.7)
Eosinophils Relative: 1 % (ref 0–5)
HCT: 43.3 % (ref 39.0–52.0)
Hemoglobin: 14.2 g/dL (ref 13.0–17.0)
Lymphocytes Relative: 9 % — ABNORMAL LOW (ref 12–46)
Lymphs Abs: 0.8 10*3/uL (ref 0.7–4.0)
MCH: 28.2 pg (ref 26.0–34.0)
MCHC: 32.8 g/dL (ref 30.0–36.0)
MCV: 86.1 fL (ref 78.0–100.0)
Monocytes Absolute: 0.8 10*3/uL (ref 0.1–1.0)
Monocytes Relative: 9 % (ref 3–12)
Neutro Abs: 6.7 10*3/uL (ref 1.7–7.7)
Neutrophils Relative %: 80 % — ABNORMAL HIGH (ref 43–77)
Platelets: 316 10*3/uL (ref 150–400)
RBC: 5.03 MIL/uL (ref 4.22–5.81)
RDW: 16.3 % — ABNORMAL HIGH (ref 11.5–15.5)
WBC: 8.3 10*3/uL (ref 4.0–10.5)

## 2014-03-10 LAB — URINALYSIS, ROUTINE W REFLEX MICROSCOPIC
Bilirubin Urine: NEGATIVE
Glucose, UA: NEGATIVE mg/dL
Ketones, ur: NEGATIVE mg/dL
Nitrite: POSITIVE — AB
Protein, ur: 30 mg/dL — AB
Specific Gravity, Urine: 1.013 (ref 1.005–1.030)
Urobilinogen, UA: 0.2 mg/dL (ref 0.0–1.0)
pH: 6 (ref 5.0–8.0)

## 2014-03-10 LAB — URINE MICROSCOPIC-ADD ON

## 2014-03-10 MED ORDER — CEFTRIAXONE SODIUM 1 G IJ SOLR
1.0000 g | Freq: Once | INTRAMUSCULAR | Status: AC
  Administered 2014-03-10: 1 g via INTRAVENOUS
  Filled 2014-03-10: qty 10

## 2014-03-10 MED ORDER — CEPHALEXIN 500 MG PO CAPS: 500.0000 mg | ORAL_CAPSULE | Freq: Four times a day (QID) | ORAL | Status: DC

## 2014-03-10 NOTE — ED Notes (Signed)
Per EMS. Pt from home. Caregiver told EMS pt is more confused than normal today. Pt has been having increased frequency of urination and has had a strong odor to urine. Caregiver reports last time pt had similar symptoms he was diagnosed with a UTI.

## 2014-03-10 NOTE — ED Notes (Signed)
Bed: TE70 Expected date: 03/10/14 Expected time: 5:44 PM Means of arrival: Ambulance Comments: AMS history of UTI

## 2014-03-10 NOTE — ED Provider Notes (Signed)
CSN: [id number]     Arrival date & time 03/10/14  1745 History   First MD Initiated Contact with Patient 03/10/14 1756     Chief Complaint  Patient presents with  . Altered Mental Status     (Consider location/radiation/quality/duration/timing/severity/associated sxs/prior Treatment) HPI   81 year old male brought in by EMS for evaluation of change in his mental status. Patient is coming from home. Apparently more confused than his baseline. Also reported increased urinary frequency in the urine is dark and has a strong odor. Patient himself has no complaints, but not sure how reliable a historian he is.  Past Medical History  Diagnosis Date  . Diabetes mellitus without complication   . Anxiety   . MI (myocardial infarction)   . Hypertension   . Hyperlipidemia    Past Surgical History  Procedure Laterality Date  . Knee surgery    . Cholecystectomy    . Hemorroidectomy    . Pacemaker insertion     Family History  Problem Relation Age of Onset  . Heart attack Mother   . Heart attack Father    History  Substance Use Topics  . Smoking status: Former Research scientist (life sciences)  . Smokeless tobacco: Never Used  . Alcohol Use: No    Review of Systems  All systems reviewed and negative, other than as noted in HPI.   Allergies  Review of patient's allergies indicates no known allergies.  Home Medications   Prior to Admission medications   Medication Sig Start Date End Date Taking? Authorizing Provider  antiseptic oral rinse (CPC / CETYLPYRIDINIUM CHLORIDE 0.05%) 0.05 % LIQD solution 7 mLs by Mouth Rinse route 2 (two) times daily. 12/22/13   Robbie Lis, MD  atorvastatin (LIPITOR) 10 MG tablet Take 10 mg by mouth daily.    Historical Provider, MD  busPIRone (BUSPAR) 15 MG tablet Take 1 tablet (15 mg total) by mouth 2 (two) times daily. 01/08/14   Shanker Kristeen Mans, MD  carvedilol (COREG) 3.125 MG tablet Take 3.125 mg by mouth 2 (two) times daily with a meal.    Historical Provider, MD   clopidogrel (PLAVIX) 75 MG tablet Take 75 mg by mouth daily.    Historical Provider, MD  Diaper Rash Products (DESITIN EX) Apply 1 application topically as needed.    Historical Provider, MD  ertapenem 1 g in sodium chloride 0.9 % 50 mL Inject 1 g into the vein daily. For 14 days from 01/06/14. Please repeat blood cultures after completion of IV Invanz. 01/09/14   Shanker Kristeen Mans, MD  feeding supplement, ENSURE COMPLETE, (ENSURE COMPLETE) LIQD Take 237 mLs by mouth 2 (two) times daily between meals. 12/22/13   Robbie Lis, MD  insulin NPH Human (HUMULIN N,NOVOLIN N) 100 UNIT/ML injection Inject 14 Units into the skin 2 (two) times daily before a meal.    Historical Provider, MD  ranitidine (ZANTAC) 150 MG capsule Take 150 mg by mouth 2 (two) times daily.    Historical Provider, MD  Skin Protectants, Misc. (EUCERIN) cream Apply 1 application topically as needed for dry skin.    Historical Provider, MD  tamsulosin (FLOMAX) 0.4 MG CAPS capsule Take 1 capsule (0.4 mg total) by mouth daily. 12/22/13   Robbie Lis, MD  warfarin (COUMADIN) 7.5 MG tablet Take 1 tablet (7.5 mg total) by mouth one time only at 6 PM. 01/10/14   Jonetta Osgood, MD   BP 111/46 mmHg  Pulse 70  Temp(Src) 98.7 F (37.1 C) (Oral)  Resp  26  SpO2 99% Physical Exam  Constitutional: He appears well-developed and well-nourished. No distress.  HENT:  Head: Normocephalic and atraumatic.  Eyes: Conjunctivae are normal. Right eye exhibits no discharge. Left eye exhibits no discharge.  Neck: Neck supple.  Cardiovascular: Normal rate, regular rhythm and normal heart sounds.  Exam reveals no gallop and no friction rub.   No murmur heard. Pulmonary/Chest: Effort normal and breath sounds normal. No respiratory distress.  Abdominal: Soft. He exhibits no distension. There is no tenderness.  Musculoskeletal: He exhibits no edema or tenderness.  Neurological: He is alert.  Skin: Skin is warm and dry.  Psychiatric: He has a normal  mood and affect. His behavior is normal. Thought content normal.  Nursing note and vitals reviewed.   ED Course  Procedures (including critical care time) Labs Review Labs Reviewed  URINALYSIS, ROUTINE W REFLEX MICROSCOPIC - Abnormal; Notable for the following:    APPearance TURBID (*)    Hgb urine dipstick LARGE (*)    Protein, ur 30 (*)    Nitrite POSITIVE (*)    Leukocytes, UA LARGE (*)    All other components within normal limits  BASIC METABOLIC PANEL - Abnormal; Notable for the following:    Glucose, Bld 172 (*)    Creatinine, Ser 1.66 (*)    GFR calc non Af Amer 35 (*)    GFR calc Af Amer 41 (*)    All other components within normal limits  CBC WITH DIFFERENTIAL/PLATELET - Abnormal; Notable for the following:    RDW 16.3 (*)    Neutrophils Relative % 80 (*)    Lymphocytes Relative 9 (*)    All other components within normal limits  URINE CULTURE  URINE MICROSCOPIC-ADD ON    Imaging Review Dg Chest 2 View  03/10/2014   CLINICAL DATA:  Increased confusion today. Increased urination and malodorous urine.  EXAM: CHEST  2 VIEW  COMPARISON:  01/03/2014 and 12/20/2013.  FINDINGS: 1923 hr. There are low lung volumes with patient rotation to the right. The left subclavian pacemaker leads appear unchanged within the right atrium and right ventricle. The heart size and mediastinal contours are stable. The lungs appear unchanged. I believe there is mild chronic scarring at the left base. There is no pleural effusion or pneumothorax. No acute osseous findings identified.  IMPRESSION: Stable radiographic appearance of the chest. No acute cardiopulmonary process.   Electronically Signed   By: Camie Patience M.D.   On: 03/10/2014 19:31     EKG Interpretation None      MDM   Final diagnoses:  Confusion  UTI (lower urinary tract infection)    81 year old male with change in mental status noted urinary changes. Urinalysis consistent with urinary tract infection. Afebrile. Renal  impairment, but this appears to be near baseline. Patient has no acute complaints. Feel he is stable for discharge at this time. Will send urine culture. Given a dose of Rocephin prior to discharge. Continued antibiotics with cephalexin.    Virgel Manifold, MD 03/14/14 1335

## 2014-03-10 NOTE — Discharge Instructions (Signed)

## 2014-03-11 ENCOUNTER — Encounter (HOSPITAL_COMMUNITY): Payer: Self-pay

## 2014-03-11 ENCOUNTER — Inpatient Hospital Stay (HOSPITAL_COMMUNITY)
Admission: EM | Admit: 2014-03-11 | Discharge: 2014-03-14 | DRG: 871 | Disposition: A | Discharge status: Home Health | Payer: Medicare Other | Attending: Internal Medicine | Admitting: Internal Medicine

## 2014-03-11 DIAGNOSIS — N390 Urinary tract infection, site not specified: Secondary | ICD-10-CM | POA: Diagnosis present

## 2014-03-11 DIAGNOSIS — G9341 Metabolic encephalopathy: Secondary | ICD-10-CM | POA: Diagnosis present

## 2014-03-11 DIAGNOSIS — Z7901 Long term (current) use of anticoagulants: Secondary | ICD-10-CM | POA: Diagnosis not present

## 2014-03-11 DIAGNOSIS — E785 Hyperlipidemia, unspecified: Secondary | ICD-10-CM | POA: Diagnosis present

## 2014-03-11 DIAGNOSIS — E119 Type 2 diabetes mellitus without complications: Secondary | ICD-10-CM | POA: Diagnosis present

## 2014-03-11 DIAGNOSIS — I252 Old myocardial infarction: Secondary | ICD-10-CM

## 2014-03-11 DIAGNOSIS — Z86718 Personal history of other venous thrombosis and embolism: Secondary | ICD-10-CM

## 2014-03-11 DIAGNOSIS — Z87891 Personal history of nicotine dependence: Secondary | ICD-10-CM

## 2014-03-11 DIAGNOSIS — E860 Dehydration: Secondary | ICD-10-CM | POA: Diagnosis present

## 2014-03-11 DIAGNOSIS — N12 Tubulo-interstitial nephritis, not specified as acute or chronic: Secondary | ICD-10-CM | POA: Diagnosis present

## 2014-03-11 DIAGNOSIS — N183 Chronic kidney disease, stage 3 (moderate): Secondary | ICD-10-CM | POA: Diagnosis present

## 2014-03-11 DIAGNOSIS — Z794 Long term (current) use of insulin: Secondary | ICD-10-CM

## 2014-03-11 DIAGNOSIS — A4150 Gram-negative sepsis, unspecified: Secondary | ICD-10-CM | POA: Diagnosis present

## 2014-03-11 DIAGNOSIS — R509 Fever, unspecified: Secondary | ICD-10-CM

## 2014-03-11 DIAGNOSIS — I129 Hypertensive chronic kidney disease with stage 1 through stage 4 chronic kidney disease, or unspecified chronic kidney disease: Secondary | ICD-10-CM | POA: Diagnosis present

## 2014-03-11 DIAGNOSIS — G9340 Encephalopathy, unspecified: Secondary | ICD-10-CM

## 2014-03-11 LAB — CBC WITH DIFFERENTIAL/PLATELET
BASOS PCT: 1 % (ref 0–1)
Basophils Absolute: 0.1 10*3/uL (ref 0.0–0.1)
Eosinophils Absolute: 0 10*3/uL (ref 0.0–0.7)
Eosinophils Relative: 0 % (ref 0–5)
HCT: 41 % (ref 39.0–52.0)
Hemoglobin: 13.2 g/dL (ref 13.0–17.0)
LYMPHS ABS: 1 10*3/uL (ref 0.7–4.0)
Lymphocytes Relative: 12 % (ref 12–46)
MCH: 27.6 pg (ref 26.0–34.0)
MCHC: 32.2 g/dL (ref 30.0–36.0)
MCV: 85.8 fL (ref 78.0–100.0)
Monocytes Absolute: 1 10*3/uL (ref 0.1–1.0)
Monocytes Relative: 12 % (ref 3–12)
NEUTROS PCT: 75 % (ref 43–77)
Neutro Abs: 6.1 10*3/uL (ref 1.7–7.7)
Platelets: 330 10*3/uL (ref 150–400)
RBC: 4.78 MIL/uL (ref 4.22–5.81)
RDW: 16.3 % — ABNORMAL HIGH (ref 11.5–15.5)
WBC: 8.1 10*3/uL (ref 4.0–10.5)

## 2014-03-11 LAB — BASIC METABOLIC PANEL
ANION GAP: 9 (ref 5–15)
BUN: 22 mg/dL (ref 6–23)
CHLORIDE: 102 mmol/L (ref 96–112)
CO2: 24 mmol/L (ref 19–32)
Calcium: 8.9 mg/dL (ref 8.4–10.5)
Creatinine, Ser: 1.71 mg/dL — ABNORMAL HIGH (ref 0.50–1.35)
GFR, EST AFRICAN AMERICAN: 40 mL/min — AB (ref >90)
GFR, EST NON AFRICAN AMERICAN: 34 mL/min — AB (ref >90)
GLUCOSE: 180 mg/dL — AB (ref 70–99)
POTASSIUM: 4.3 mmol/L (ref 3.5–5.1)
Sodium: 135 mmol/L (ref 135–145)

## 2014-03-11 LAB — GLUCOSE, CAPILLARY
GLUCOSE-CAPILLARY: 178 mg/dL — AB (ref 70–99)
Glucose-Capillary: 184 mg/dL — ABNORMAL HIGH (ref 70–99)

## 2014-03-11 LAB — PROTIME-INR
INR: 1.79 — ABNORMAL HIGH (ref 0.00–1.49)
Prothrombin Time: 21 seconds — ABNORMAL HIGH (ref 11.6–15.2)

## 2014-03-11 LAB — MAGNESIUM: Magnesium: 1.7 mg/dL (ref 1.5–2.5)

## 2014-03-11 LAB — TROPONIN I
TROPONIN I: 0.04 ng/mL — AB (ref <0.031)
TROPONIN I: 0.05 ng/mL — AB (ref <0.031)

## 2014-03-11 LAB — HEPATIC FUNCTION PANEL
ALT: 20 U/L (ref 0–53)
AST: 26 U/L (ref 0–37)
Albumin: 3.4 g/dL — ABNORMAL LOW (ref 3.5–5.2)
Alkaline Phosphatase: 66 U/L (ref 39–117)
Bilirubin, Direct: <0.1 mg/dL (ref 0.0–0.5)
Total Bilirubin: 0.4 mg/dL (ref 0.3–1.2)
Total Protein: 7.7 g/dL (ref 6.0–8.3)

## 2014-03-11 LAB — MRSA PCR SCREENING: MRSA BY PCR: POSITIVE — AB

## 2014-03-11 LAB — I-STAT CG4 LACTIC ACID, ED: LACTIC ACID, VENOUS: 2.5 mmol/L — AB (ref 0.5–2.0)

## 2014-03-11 MED ORDER — SODIUM CHLORIDE 0.9 % IV SOLN: 500.0000 mg | Freq: Three times a day (TID) | INTRAVENOUS | Status: DC

## 2014-03-11 MED ORDER — INSULIN ASPART 100 UNIT/ML ~~LOC~~ SOLN
0.0000 [IU] | Freq: Three times a day (TID) | SUBCUTANEOUS | Status: DC
Start: 2014-03-12 — End: 2014-03-14
  Administered 2014-03-12: 3 [IU] via SUBCUTANEOUS
  Administered 2014-03-12 (×2): 1 [IU] via SUBCUTANEOUS
  Administered 2014-03-13 – 2014-03-14 (×3): 2 [IU] via SUBCUTANEOUS

## 2014-03-11 MED ORDER — SODIUM CHLORIDE 0.9 % IV BOLUS (SEPSIS)
1000.0000 mL | Freq: Once | INTRAVENOUS | Status: AC
  Administered 2014-03-11: 1000 mL via INTRAVENOUS

## 2014-03-11 MED ORDER — ALUM & MAG HYDROXIDE-SIMETH 200-200-20 MG/5ML PO SUSP: 30.0000 mL | Freq: Four times a day (QID) | ORAL | Status: DC | PRN

## 2014-03-11 MED ORDER — WARFARIN - PHARMACIST DOSING INPATIENT
Freq: Every day | Status: DC
  Administered 2014-03-13: 18:00:00

## 2014-03-11 MED ORDER — ONDANSETRON HCL 4 MG/2ML IJ SOLN: 4.0000 mg | Freq: Four times a day (QID) | INTRAMUSCULAR | Status: DC | PRN

## 2014-03-11 MED ORDER — INFLUENZA VAC SPLIT QUAD 0.5 ML IM SUSY
0.5000 mL | PREFILLED_SYRINGE | INTRAMUSCULAR | Status: AC
  Administered 2014-03-13: 0.5 mL via INTRAMUSCULAR
  Filled 2014-03-11 (×3): qty 0.5

## 2014-03-11 MED ORDER — ACETAMINOPHEN 325 MG PO TABS
650.0000 mg | ORAL_TABLET | ORAL | Status: DC | PRN
Start: 2014-03-11 — End: 2014-03-14
  Administered 2014-03-11: 650 mg via ORAL
  Filled 2014-03-11: qty 2

## 2014-03-11 MED ORDER — DEXTROSE 5 % IV SOLN
1.0000 g | Freq: Once | INTRAVENOUS | Status: AC
  Administered 2014-03-11: 1 g via INTRAVENOUS
  Filled 2014-03-11: qty 10

## 2014-03-11 MED ORDER — SODIUM CHLORIDE 0.9 % IJ SOLN
3.0000 mL | Freq: Two times a day (BID) | INTRAMUSCULAR | Status: DC
  Administered 2014-03-12: 3 mL via INTRAVENOUS

## 2014-03-11 MED ORDER — CARVEDILOL 3.125 MG PO TABS
3.1250 mg | ORAL_TABLET | Freq: Two times a day (BID) | ORAL | Status: DC
  Administered 2014-03-11 – 2014-03-14 (×6): 3.125 mg via ORAL
  Filled 2014-03-11 (×6): qty 1

## 2014-03-11 MED ORDER — ACETAMINOPHEN 325 MG PO TABS
650.0000 mg | ORAL_TABLET | Freq: Four times a day (QID) | ORAL | Status: DC | PRN
  Administered 2014-03-11: 650 mg via ORAL
  Filled 2014-03-11: qty 2

## 2014-03-11 MED ORDER — TAMSULOSIN HCL 0.4 MG PO CAPS
0.4000 mg | ORAL_CAPSULE | Freq: Every day | ORAL | Status: DC
  Administered 2014-03-11 – 2014-03-14 (×4): 0.4 mg via ORAL
  Filled 2014-03-11 (×5): qty 1

## 2014-03-11 MED ORDER — ONDANSETRON HCL 4 MG PO TABS: 4.0000 mg | ORAL_TABLET | Freq: Four times a day (QID) | ORAL | Status: DC | PRN

## 2014-03-11 MED ORDER — WARFARIN SODIUM 7.5 MG PO TABS: 7.5000 mg | ORAL_TABLET | Freq: Once | ORAL | Status: DC

## 2014-03-11 MED ORDER — HYDROMORPHONE HCL 1 MG/ML IJ SOLN: 0.5000 mg | INTRAMUSCULAR | Status: DC | PRN

## 2014-03-11 MED ORDER — ATORVASTATIN CALCIUM 10 MG PO TABS
5.0000 mg | ORAL_TABLET | Freq: Every day | ORAL | Status: DC
  Administered 2014-03-11 – 2014-03-14 (×4): 5 mg via ORAL
  Filled 2014-03-11 (×5): qty 0.5

## 2014-03-11 MED ORDER — INSULIN NPH (HUMAN) (ISOPHANE) 100 UNIT/ML ~~LOC~~ SUSP
16.0000 [IU] | Freq: Two times a day (BID) | SUBCUTANEOUS | Status: DC
  Administered 2014-03-12 – 2014-03-14 (×5): 16 [IU] via SUBCUTANEOUS
  Filled 2014-03-11: qty 10

## 2014-03-11 MED ORDER — PNEUMOCOCCAL VAC POLYVALENT 25 MCG/0.5ML IJ INJ
0.5000 mL | INJECTION | INTRAMUSCULAR | Status: AC
  Administered 2014-03-13: 0.5 mL via INTRAMUSCULAR
  Filled 2014-03-11 (×3): qty 0.5

## 2014-03-11 MED ORDER — SODIUM CHLORIDE 0.9 % IV SOLN
250.0000 mg | Freq: Four times a day (QID) | INTRAVENOUS | Status: DC
  Administered 2014-03-11 – 2014-03-13 (×7): 250 mg via INTRAVENOUS
  Filled 2014-03-11 (×10): qty 250

## 2014-03-11 MED ORDER — ACETAMINOPHEN 650 MG RE SUPP
650.0000 mg | RECTAL | Status: DC | PRN
Start: 2014-03-11 — End: 2014-03-14
  Administered 2014-03-12: 650 mg via RECTAL
  Filled 2014-03-11: qty 1

## 2014-03-11 MED ORDER — LEVALBUTEROL HCL 0.63 MG/3ML IN NEBU: 0.6300 mg | INHALATION_SOLUTION | Freq: Four times a day (QID) | RESPIRATORY_TRACT | Status: DC | PRN

## 2014-03-11 MED ORDER — ACETAMINOPHEN 325 MG PO TABS
650.0000 mg | ORAL_TABLET | Freq: Once | ORAL | Status: AC
  Administered 2014-03-11: 650 mg via ORAL
  Filled 2014-03-11: qty 2

## 2014-03-11 MED ORDER — CLOPIDOGREL BISULFATE 75 MG PO TABS
75.0000 mg | ORAL_TABLET | Freq: Every day | ORAL | Status: DC
  Administered 2014-03-11 – 2014-03-14 (×4): 75 mg via ORAL
  Filled 2014-03-11 (×5): qty 1

## 2014-03-11 MED ORDER — ENSURE COMPLETE PO LIQD
237.0000 mL | Freq: Two times a day (BID) | ORAL | Status: DC
Start: 2014-03-12 — End: 2014-03-14
  Administered 2014-03-12 – 2014-03-14 (×5): 237 mL via ORAL

## 2014-03-11 MED ORDER — WARFARIN SODIUM 5 MG PO TABS
5.0000 mg | ORAL_TABLET | Freq: Once | ORAL | Status: AC
  Administered 2014-03-11: 5 mg via ORAL
  Filled 2014-03-11: qty 1

## 2014-03-11 MED ORDER — WARFARIN SODIUM 5 MG PO TABS: 5.0000 mg | ORAL_TABLET | Freq: Every day | ORAL | Status: DC

## 2014-03-11 MED ORDER — ACETAMINOPHEN 650 MG RE SUPP: 650.0000 mg | Freq: Four times a day (QID) | RECTAL | Status: DC | PRN

## 2014-03-11 NOTE — ED Notes (Signed)
Floor nurse notified and and ed nurse notified...klj

## 2014-03-11 NOTE — H&P (Addendum)
[facility] History and Physical  Randy [name] MGQ:[id number] DOB: [birth year] DOA: 03/11/2014  Referring physician:  PCP: Pcp Not In System   Chief Complaint: AMS and increasing urinary frequency.  HPI:  Randy [name] is a 81 y.o. male with PMHx of MI, HTN, DM, HLD presenting to the ED with AMS.Caregiver told EMS pt is more confused than normal today. Pt has been having increased frequency of urination and has had a strong odor to urine. Caregiver reports last time pt had similar symptoms he was diagnosed with a UTI.He was seen in the ED 1/23 , rx with rocephine and DC home on keflex which he was unable to fill . Today he presents from home and he was noted to have a fever. In the ED his fever was recorded as 102.7 . UA shows WBCs too numerous to count. Patient very weak and clinically dehydrated      Review of Systems: negative for the following  Constitutional: Positive for fever, chills, diaphoresis, appetite change and fatigue.  HEENT: Denies photophobia, eye pain, redness, hearing loss, ear pain, congestion, sore throat, rhinorrhea, sneezing, mouth sores, trouble swallowing, neck pain, neck stiffness and tinnitus.  Respiratory: Denies SOB, DOE, cough, chest tightness, and wheezing.  Cardiovascular: Denies chest pain, palpitations and leg swelling.  Gastrointestinal: Denies nausea, vomiting, abdominal pain, diarrhea, constipation, blood in stool and abdominal distention.  Genitourinary: Positive for dysuria, urgency, frequency, hematuria, flank pain and difficulty urinating.  Musculoskeletal: Denies myalgias, back pain, joint swelling, arthralgias and gait problem.  Skin: Denies pallor, rash and wound.  Neurological: Positive for dizziness, seizures, syncope, weakness, light-headedness, numbness and headaches.  Hematological: Denies adenopathy. Easy bruising, personal or family bleeding history  Psychiatric/Behavioral: Denies suicidal ideation, mood changes, confusion,  nervousness, sleep disturbance and agitation       Past Medical History  Diagnosis Date  . Diabetes mellitus without complication   . Anxiety   . MI (myocardial infarction)   . Hypertension   . Hyperlipidemia      Past Surgical History  Procedure Laterality Date  . Knee surgery    . Cholecystectomy    . Hemorroidectomy    . Pacemaker insertion        Social History:  reports that he has quit smoking. He has never used smokeless tobacco. He reports that he does not drink alcohol or use illicit drugs.    No Known Allergies  Family History  Problem Relation Age of Onset  . Heart attack Mother   . Heart attack Father                        Prior to Admission medications   Medication Sig Start Date End Date Taking? Authorizing Provider  busPIRone (BUSPAR) 15 MG tablet Take 1 tablet (15 mg total) by mouth 2 (two) times daily. 01/08/14  Yes Shanker Kristeen Mans, MD  carvedilol (COREG) 3.125 MG tablet Take 3.125 mg by mouth 2 (two) times daily with a meal.   Yes Historical Provider, MD  clopidogrel (PLAVIX) 75 MG tablet Take 75 mg by mouth daily.   Yes Historical Provider, MD  insulin NPH Human (HUMULIN N,NOVOLIN N) 100 UNIT/ML injection Inject 16 Units into the skin 2 (two) times daily before a meal.    Yes Historical Provider, MD  ranitidine (ZANTAC) 150 MG capsule Take 150 mg by mouth 2 (two) times daily.   Yes Historical Provider, MD  tamsulosin (FLOMAX) 0.4 MG CAPS capsule Take 1 capsule (0.4 mg total)  by mouth daily. 12/22/13  Yes Robbie Lis, MD  warfarin (COUMADIN) 5 MG tablet Take 5 mg by mouth daily.   Yes Historical Provider, MD  antiseptic oral rinse (CPC / CETYLPYRIDINIUM CHLORIDE 0.05%) 0.05 % LIQD solution 7 mLs by Mouth Rinse route 2 (two) times daily. Patient not taking: Reported on 03/11/2014 12/22/13   Robbie Lis, MD  atorvastatin (LIPITOR) 10 MG tablet Take 5 mg by mouth daily.     Historical Provider, MD  cephALEXin (KEFLEX) 500 MG capsule Take 1  capsule (500 mg total) by mouth 4 (four) times daily. Patient not taking: Reported on 03/11/2014 03/10/14   Virgel Manifold, MD  Diaper Rash Products (DESITIN EX) Apply 1 application topically as needed.    Historical Provider, MD  ertapenem 1 g in sodium chloride 0.9 % 50 mL Inject 1 g into the vein daily. For 14 days from 01/06/14. Please repeat blood cultures after completion of IV Invanz. Patient not taking: Reported on 03/11/2014 01/09/14   Jonetta Osgood, MD  feeding supplement, ENSURE COMPLETE, (ENSURE COMPLETE) LIQD Take 237 mLs by mouth 2 (two) times daily between meals. 12/22/13   Robbie Lis, MD  Skin Protectants, Misc. (EUCERIN) cream Apply 1 application topically as needed for dry skin.    Historical Provider, MD  warfarin (COUMADIN) 7.5 MG tablet Take 1 tablet (7.5 mg total) by mouth one time only at 6 PM. 01/10/14   Jonetta Osgood, MD     Physical Exam: Filed Vitals:   03/11/14 1100 03/11/14 1117 03/11/14 1249 03/11/14 1434  BP: 123/46   145/55  Pulse: 72   74  Temp:  100.9 F (38.3 C) 99.9 F (37.7 C) 98 F (36.7 C)  TempSrc:  Rectal Rectal Oral  Resp: 18   17  SpO2: 97%   100%    Constitutional: He appears well-developed. He appears distressed (he appears uncomfortable).  Elderly, frail  Head: Normocephalic and atraumatic  Ear: TM normal bilaterally  Mouth: no erythema or exudates, dry mucous membranes Eyes: PERRL, EOMI, conjunctivae normal, No scleral icterus.  Neck: Supple, Trachea midline normal ROM, No JVD, mass, thyromegaly, or carotid bruit present.  Cardiovascular: RRR, S1 normal, S2 normal, no MRG, pulses symmetric and intact bilaterally  Pulmonary/Chest: CTAB, no wheezes, rales, or rhonchi  Abdominal: Soft. Non-tender, non-distended, bowel sounds are normal, no masses, organomegaly, or guarding present.  GU: no CVA tenderness Musculoskeletal: No joint deformities, erythema, or stiffness, ROM full and no nontender Ext: no edema and no cyanosis, pulses  palpable bilaterally (DP and PT)  Hematology: no cervical, inginal, or axillary adenopathy.  Neurological: A&O x3, Strenght is normal and symmetric bilaterally, cranial nerve II-XII are grossly intact, no focal motor deficit, sensory intact to light touch bilaterally.  Skin: Warm, dry and intact. No rash, cyanosis, or clubbing.  Psychiatric: Normal mood and affect. speech and behavior is normal. Judgment and thought content normal. Cognition and memory are normal.       Labs on Admission:    Basic Metabolic Panel:  Recent Labs Lab 03/10/14 1859 03/11/14 1004  NA 136 135  K 4.2 4.3  CL 104 102  CO2 25 24  GLUCOSE 172* 180*  BUN 23 22  CREATININE 1.66* 1.71*  CALCIUM 9.0 8.9   Liver Function Tests: No results for input(s): AST, ALT, ALKPHOS, BILITOT, PROT, ALBUMIN in the last 168 hours. No results for input(s): LIPASE, AMYLASE in the last 168 hours. No results for input(s): AMMONIA in the last 168  hours. CBC:  Recent Labs Lab 03/10/14 1859 03/11/14 1004  WBC 8.3 8.1  NEUTROABS 6.7 6.1  HGB 14.2 13.2  HCT 43.3 41.0  MCV 86.1 85.8  PLT 316 330   Cardiac Enzymes: No results for input(s): CKTOTAL, CKMB, CKMBINDEX, TROPONINI in the last 168 hours.  BNP (last 3 results)  Recent Labs  12/20/13 0128 01/03/14 2220  PROBNP 3532.0* 1521.0*      CBG: No results for input(s): GLUCAP in the last 168 hours.  Radiological Exams on Admission: Dg Chest 2 View  03/10/2014   CLINICAL DATA:  Increased confusion today. Increased urination and malodorous urine.  EXAM: CHEST  2 VIEW  COMPARISON:  01/03/2014 and 12/20/2013.  FINDINGS: 1923 hr. There are low lung volumes with patient rotation to the right. The left subclavian pacemaker leads appear unchanged within the right atrium and right ventricle. The heart size and mediastinal contours are stable. The lungs appear unchanged. I believe there is mild chronic scarring at the left base. There is no pleural effusion or  pneumothorax. No acute osseous findings identified.  IMPRESSION: Stable radiographic appearance of the chest. No acute cardiopulmonary process.   Electronically Signed   By: Camie Patience M.D.   On: 03/10/2014 19:31    EKG: Independently reviewed.  Assessment/Plan Active Problems:   Pyelonephritis   SIRS secondary to pyelonephritis/ESBL UTI? Chest x-ray negative Patient placed on Primaxin given history of ESBL Blood culture 2  Chronic kidney disease, stage III Creatinine at baseline We'll start the patient on gentle IV hydration given clinically dehydrated   Diabetes mellitus We'll check a hemoglobin A1c Start the patient on sliding scale insulin  Acute encephalopathy: Likely secondary to urinary tract infection and sepsis  Hx of MI: s/p stent placement per patient. On plavix at home. No chest pain on admission. - continue Plavix - Continue Lipitor - hold coreg  Hx of R leg DVT: On Coumadin, and INR 1.7 -Continue Coumadin per pharmacy  ode Status:   full Family Communication: bedside Disposition Plan: admit   Time spent: 70 mins   [facility] Pager [phone]  If 7PM-7AM, please contact night-coverage [email or web address] Password [facility] 03/11/2014, 2:53 PM

## 2014-03-11 NOTE — Progress Notes (Signed)
ANTIBIOTIC CONSULT NOTE - INITIAL  Pharmacy Consult for Primaxin Indication: Pyelonephritis, Hx of ESBL infection  No Known Allergies  Patient Measurements:   Height: 5'7" Weight: current pending, 97.5 kg as of 01/08/14  Vital Signs: Temp: 98 F (36.7 C) (01/24 1434) Temp Source: Oral (01/24 1434) BP: 145/55 mmHg (01/24 1434) Pulse Rate: 74 (01/24 1434) Intake/Output from previous day:   Intake/Output from this shift:    Labs:  Recent Labs  03/10/14 1859 03/11/14 1004  WBC 8.3 8.1  HGB 14.2 13.2  PLT 316 330  CREATININE 1.66* 1.71*   CrCl cannot be calculated (Unknown ideal weight.). No results for input(s): VANCOTROUGH, VANCOPEAK, VANCORANDOM, GENTTROUGH, GENTPEAK, GENTRANDOM, TOBRATROUGH, TOBRAPEAK, TOBRARND, AMIKACINPEAK, AMIKACINTROU, AMIKACIN in the last 72 hours.   Microbiology: No results found for this or any previous visit (from the past 720 hour(s)).  Medical History: Past Medical History  Diagnosis Date  . Diabetes mellitus without complication   . Anxiety   . MI (myocardial infarction)   . Hypertension   . Hyperlipidemia     Assessment: 81 y/o M with PMH of ESBL UTI, MI, HTN, DM, HLD, and hx of DVT on chronic anticoagulation with warfarin who presents to [facility] ED with AMS, malodorous urine, and frequency of urination. Patient was seen in ED on 1/23, where he received a dose of Rocephin and was sent home with a prescription from Keflex that he was unable to fill.  Pharmacy consulted to dose Primaxin for this patient for pyelonephritis with hx of ESBL infection.  1/23 >> Rocephin x 1 1/24 >> Primaxin >>  Tmax: 102.7 WBCs: WNL Renal: SCr 1.71, CrCl ~ 31 mL/min Normalized  1/24 blood x 2: sent 1/24 urine: sent  Goal of Therapy:  Appropriate antibiotic dosing for renal function and indication Eradication of infection  Plan:   Primaxin 250 mg IV q6h.  Follow-up renal function, cultures, clinical course.   Lindell Spar, PharmD, BCPS Pager:  [phone] 03/11/2014 3:39 PM

## 2014-03-11 NOTE — ED Provider Notes (Signed)
CSN: [id number]     Arrival date & time 03/11/14  0941 History   First MD Initiated Contact with Patient 03/11/14 0945     Chief Complaint  Patient presents with  . Weakness     (Consider location/radiation/quality/duration/timing/severity/associated sxs/prior Treatment) HPI   Randy [name] is a 81 y.o. male who presents for evaluation of altered mental status.  This was noticed, when a home health aide came to see his wife, this morning.  He was in the ED yesterday and treated for a UTI with IV Rocephin and given a prescription for Keflex.  It is unknown if he started the Keflex yet.  The patient cannot give history.  He does not know why he is here.  Level V Caveat- confusion   Past Medical History  Diagnosis Date  . Diabetes mellitus without complication   . Anxiety   . MI (myocardial infarction)   . Hypertension   . Hyperlipidemia    Past Surgical History  Procedure Laterality Date  . Knee surgery    . Cholecystectomy    . Hemorroidectomy    . Pacemaker insertion     Family History  Problem Relation Age of Onset  . Heart attack Mother   . Heart attack Father    History  Substance Use Topics  . Smoking status: Former Research scientist (life sciences)  . Smokeless tobacco: Never Used  . Alcohol Use: No    Review of Systems  Unable to perform ROS     Allergies  Review of patient's allergies indicates no known allergies.  Home Medications   Prior to Admission medications   Medication Sig Start Date End Date Taking? Authorizing Provider  clopidogrel (PLAVIX) 75 MG tablet Take 75 mg by mouth daily.   Yes Historical Provider, MD  antiseptic oral rinse (CPC / CETYLPYRIDINIUM CHLORIDE 0.05%) 0.05 % LIQD solution 7 mLs by Mouth Rinse route 2 (two) times daily. 12/22/13   Robbie Lis, MD  atorvastatin (LIPITOR) 10 MG tablet Take 10 mg by mouth daily.    Historical Provider, MD  busPIRone (BUSPAR) 15 MG tablet Take 1 tablet (15 mg total) by mouth 2 (two) times daily. 01/08/14   Shanker Kristeen Mans, MD  carvedilol (COREG) 3.125 MG tablet Take 3.125 mg by mouth 2 (two) times daily with a meal.    Historical Provider, MD  cephALEXin (KEFLEX) 500 MG capsule Take 1 capsule (500 mg total) by mouth 4 (four) times daily. 03/10/14   Virgel Manifold, MD  Diaper Rash Products (DESITIN EX) Apply 1 application topically as needed.    Historical Provider, MD  ertapenem 1 g in sodium chloride 0.9 % 50 mL Inject 1 g into the vein daily. For 14 days from 01/06/14. Please repeat blood cultures after completion of IV Invanz. 01/09/14   Shanker Kristeen Mans, MD  feeding supplement, ENSURE COMPLETE, (ENSURE COMPLETE) LIQD Take 237 mLs by mouth 2 (two) times daily between meals. 12/22/13   Robbie Lis, MD  insulin NPH Human (HUMULIN N,NOVOLIN N) 100 UNIT/ML injection Inject 14 Units into the skin 2 (two) times daily before a meal.    Historical Provider, MD  ranitidine (ZANTAC) 150 MG capsule Take 150 mg by mouth 2 (two) times daily.    Historical Provider, MD  Skin Protectants, Misc. (EUCERIN) cream Apply 1 application topically as needed for dry skin.    Historical Provider, MD  tamsulosin (FLOMAX) 0.4 MG CAPS capsule Take 1 capsule (0.4 mg total) by mouth daily. 12/22/13   Link Snuffer  Charlies Silvers, MD  warfarin (COUMADIN) 7.5 MG tablet Take 1 tablet (7.5 mg total) by mouth one time only at 6 PM. 01/10/14   Shanker Kristeen Mans, MD   BP 123/46 mmHg  Pulse 72  Temp(Src) 99.9 F (37.7 C) (Rectal)  Resp 18  SpO2 97% Physical Exam  Constitutional: He appears well-developed. He appears distressed (he appears uncomfortable).  Elderly, frail  HENT:  Head: Normocephalic and atraumatic.  Right Ear: External ear normal.  Left Ear: External ear normal.  Mucous membranes are dry  Eyes: Conjunctivae and EOM are normal. Pupils are equal, round, and reactive to light.  Neck: Normal range of motion and phonation normal. Neck supple.  Cardiovascular: Normal rate, regular rhythm and normal heart sounds.   Pulmonary/Chest: Effort  normal and breath sounds normal. He exhibits no bony tenderness.  Abdominal: Soft. There is no tenderness.  Musculoskeletal: Normal range of motion.  Neurological: He is alert. No cranial nerve deficit or sensory deficit. He exhibits normal muscle tone. Coordination normal.  Skin: Skin is warm, dry and intact. No erythema. No pallor.  Psychiatric: His behavior is normal.  Nursing note and vitals reviewed.   ED Course  Procedures (including critical care time) He is febrile, treated with Tylenol, for fever.  Medications  sodium chloride 0.9 % bolus 1,000 mL (0 mLs Intravenous Stopped 03/11/14 1149)  acetaminophen (TYLENOL) tablet 650 mg (650 mg Oral Given 03/11/14 1025)  cefTRIAXone (ROCEPHIN) 1 g in dextrose 5 % 50 mL IVPB (0 g Intravenous Stopped 03/11/14 1224)    Patient Vitals for the past 24 hrs:  BP Temp Temp src Pulse Resp SpO2  03/11/14 1249 - 99.9 F (37.7 C) Rectal - - -  03/11/14 1117 - 100.9 F (38.3 C) Rectal - - -  03/11/14 1100 (!) 123/46 mmHg - - 72 18 97 %  03/11/14 1040 (!) 130/51 mmHg - - 72 18 98 %  03/11/14 0949 (!) 143/52 mmHg 102.7 F (39.3 C) Rectal 70 18 97 %     2:22 PM Reevaluation with update and discussion. After initial assessment and treatment, an updated evaluation reveals patient is now alert, awake, responsive and eating.  His temperature has improved.Richarda Blade   I talked to the patient's son, Randy [name], who states that the patient has upper.  His home situation, being that he requires supervision, by AIDS, or there about 18 hours each day.  They typically get him in bed.  They did not return until the morning.  There are 2 other family matters in the home, but they are also disabled and require assistance from AIDS.  Son lives remotely, from [place].  The son plans on seeing about 24-hour care, beginning tomorrow.  In the meantime, he feels that the patient would be unsafe if he returns to his home setting.  2:31 PM-Consult complete  with Dr. Allyson Sabal. Patient case explained and discussed. She agrees to admit patient for further evaluation and treatment. Call ended at [facility] - Abnormal; Notable for the following:    Glucose, Bld 180 (*)    Creatinine, Ser 1.71 (*)    GFR calc non Af Amer 34 (*)    GFR calc Af Amer 40 (*)    All other components within normal limits  CBC WITH DIFFERENTIAL/PLATELET - Abnormal; Notable for the following:    RDW 16.3 (*)    All other components within normal limits  I-STAT CG4 LACTIC ACID, ED -  Abnormal; Notable for the following:    Lactic Acid, Venous 2.50 (*)    All other components within normal limits  CULTURE, BLOOD (ROUTINE X 2)  CULTURE, BLOOD (ROUTINE X 2)    Imaging Review Dg Chest 2 View  03/10/2014   CLINICAL DATA:  Increased confusion today. Increased urination and malodorous urine.  EXAM: CHEST  2 VIEW  COMPARISON:  01/03/2014 and 12/20/2013.  FINDINGS: 1923 hr. There are low lung volumes with patient rotation to the right. The left subclavian pacemaker leads appear unchanged within the right atrium and right ventricle. The heart size and mediastinal contours are stable. The lungs appear unchanged. I believe there is mild chronic scarring at the left base. There is no pleural effusion or pneumothorax. No acute osseous findings identified.  IMPRESSION: Stable radiographic appearance of the chest. No acute cardiopulmonary process.   Electronically Signed   By: Camie Patience M.D.   On: 03/10/2014 19:31     EKG Interpretation None      MDM   Final diagnoses:  Febrile illness  UTI (lower urinary tract infection)    Urinary tract infection with fever, and confusion.  I suspect that he is delirious from the fever.  Patient is debilitated, and unable to be managed as he currently is, in his home setting.  He also had a difficult to treat urinary tract infection, with bacteremia, about 2 months ago.  He will therefore be admitted  for treatment and stabilization.  Nursing Notes Reviewed/ Care Coordinated, and agree without changes. Applicable Imaging Reviewed.  Interpretation of Laboratory Data incorporated into ED treatment  Plan: Admit    Richarda Blade, MD 03/11/14 [phone]

## 2014-03-11 NOTE — ED Notes (Signed)
Bed: CB76 Expected date:  Expected time:  Means of arrival:  Comments: EMS- aloc, seen last PM for UTI

## 2014-03-11 NOTE — Progress Notes (Signed)
ANTICOAGULATION CONSULT NOTE - Initial Consult  Pharmacy Consult for Warfarin Indication: VTE prophylaxis with prior DVT  No Known Allergies  Patient Measurements: Height: 5\' 7"  (170.2 cm) Weight: 198 lb 1 oz (89.841 kg) IBW/kg (Calculated) : 66.1 Heparin Dosing Weight: n/a  Vital Signs: Temp: 101.8 F (38.8 C) (01/24 1617) Temp Source: Oral (01/24 1617) BP: 147/56 mmHg (01/24 1617) Pulse Rate: 87 (01/24 1617)  Labs:  Recent Labs  03/10/14 1859 03/11/14 1004 03/11/14 1503  HGB 14.2 13.2  --   HCT 43.3 41.0  --   PLT 316 330  --   LABPROT  --   --  21.0*  INR  --   --  1.79*  CREATININE 1.66* 1.71*  --   TROPONINI  --   --  0.04*    Estimated Creatinine Clearance: 32.5 mL/min (by C-G formula based on Cr of 1.71).   Medical History: Past Medical History  Diagnosis Date  . Diabetes mellitus without complication   . Anxiety   . MI (myocardial infarction)   . Hypertension   . Hyperlipidemia     Medications:  Prescriptions prior to admission  Medication Sig Dispense Refill Last Dose  . busPIRone (BUSPAR) 15 MG tablet Take 1 tablet (15 mg total) by mouth 2 (two) times daily. 30 tablet 0 Past Week at Unknown time  . carvedilol (COREG) 3.125 MG tablet Take 3.125 mg by mouth 2 (two) times daily with a meal.   Past Week at Unknown time  . clopidogrel (PLAVIX) 75 MG tablet Take 75 mg by mouth daily.   Past Week at Unknown time  . insulin NPH Human (HUMULIN N,NOVOLIN N) 100 UNIT/ML injection Inject 16 Units into the skin 2 (two) times daily before a meal.    Unknown  . ranitidine (ZANTAC) 150 MG capsule Take 150 mg by mouth 2 (two) times daily.   Past Week at Unknown time  . tamsulosin (FLOMAX) 0.4 MG CAPS capsule Take 1 capsule (0.4 mg total) by mouth daily. 30 capsule 0 Past Week at Unknown time  . warfarin (COUMADIN) 5 MG tablet Take 5 mg by mouth daily.   Past Week at Unknown time  . antiseptic oral rinse (CPC / CETYLPYRIDINIUM CHLORIDE 0.05%) 0.05 % LIQD solution 7  mLs by Mouth Rinse route 2 (two) times daily. (Patient not taking: Reported on 03/11/2014) 44 mL 0 01/04/2014 at Unknown time  . atorvastatin (LIPITOR) 10 MG tablet Take 5 mg by mouth daily.    Unknown  . cephALEXin (KEFLEX) 500 MG capsule Take 1 capsule (500 mg total) by mouth 4 (four) times daily. (Patient not taking: Reported on 03/11/2014) 20 capsule 0   . Diaper Rash Products (DESITIN EX) Apply 1 application topically as needed.   Unknown  . ertapenem 1 g in sodium chloride 0.9 % 50 mL Inject 1 g into the vein daily. For 14 days from 01/06/14. Please repeat blood cultures after completion of IV Invanz. (Patient not taking: Reported on 03/11/2014)     . feeding supplement, ENSURE COMPLETE, (ENSURE COMPLETE) LIQD Take 237 mLs by mouth 2 (two) times daily between meals. 237 mL 0 Unknown  . Skin Protectants, Misc. (EUCERIN) cream Apply 1 application topically as needed for dry skin.   Unknown  . warfarin (COUMADIN) 7.5 MG tablet Take 1 tablet (7.5 mg total) by mouth one time only at 6 PM. 20 tablet 0     Assessment: 89yoM admitted for AMS, fever; UA c/w UTI.  On warfarin 5 mg daily  at home, to resume per pharmacy while in hospital.  PMH ESBL UTI; also MI, DM, HTN, HLD  Baseline CBC wnl Baseline INR subtherapeutic at 1.79 Last dose warfarin sometime in previous week, although exact day unknown Noted to be on Plavix for prior MI; now on broad spectrum abx in hospital No signs of bleeding per RN/MD notes  Goal of Therapy:  INR 2-3    Plan:  I expect patient's low INR is due to recent skipped doses.  Also acutely ill and now on broad spectrum abx, which may indirectly increase INR.  Unclear how long ago DVT was.  For these reasons, will resume home dosing rather than loading patient with warfarin  Warfarin 5 mg po x 1 tonight; would use even lower doses (i.e. 4 mg) once INR therapeutic as expect acute illness to lower warfarin requirement  Daily INR, CBC at least q72 hr while on warfarin  F/u  for signs of bleeding  Recommend f/u regarding duration of warfarin and Plavix with patient's cardiologist or primary prescriber  Reuel Boom, PharmD Pager: [phone] 03/11/2014, 4:54 PM

## 2014-03-11 NOTE — ED Notes (Signed)
UNABLE TO GET SECOND SET OF BLOOD CULTURES X2

## 2014-03-11 NOTE — ED Notes (Signed)
He was seen here yesterday evening; dx with u.t.i. And received IV antibiotic and was d/c home.  A home health aide phoned EMS because she felt "pt. Not awake as usual".  He is drowsy and in no distress.  He lives with his daughter, who has m.s.  His skin is pale, warm and dry and he is breathing normally.

## 2014-03-11 NOTE — ED Notes (Signed)
He is eating lunch with alacrity after he is assisted with set-up of same.  I just facilitated a call with pt. Daughter and caregiver to Dr. Eulis Foster.

## 2014-03-12 LAB — COMPREHENSIVE METABOLIC PANEL
ALT: 18 U/L (ref 0–53)
AST: 22 U/L (ref 0–37)
Albumin: 3 g/dL — ABNORMAL LOW (ref 3.5–5.2)
Alkaline Phosphatase: 58 U/L (ref 39–117)
Anion gap: 11 (ref 5–15)
BILIRUBIN TOTAL: 0.6 mg/dL (ref 0.3–1.2)
BUN: 23 mg/dL (ref 6–23)
CO2: 24 mmol/L (ref 19–32)
Calcium: 8.6 mg/dL (ref 8.4–10.5)
Chloride: 104 mmol/L (ref 96–112)
Creatinine, Ser: 1.74 mg/dL — ABNORMAL HIGH (ref 0.50–1.35)
GFR calc Af Amer: 39 mL/min — ABNORMAL LOW (ref >90)
GFR calc non Af Amer: 34 mL/min — ABNORMAL LOW (ref >90)
Glucose, Bld: 170 mg/dL — ABNORMAL HIGH (ref 70–99)
Potassium: 4.2 mmol/L (ref 3.5–5.1)
SODIUM: 139 mmol/L (ref 135–145)
Total Protein: 7 g/dL (ref 6.0–8.3)

## 2014-03-12 LAB — GLUCOSE, CAPILLARY
GLUCOSE-CAPILLARY: 150 mg/dL — AB (ref 70–99)
GLUCOSE-CAPILLARY: 136 mg/dL — AB (ref 70–99)
Glucose-Capillary: 230 mg/dL — ABNORMAL HIGH (ref 70–99)

## 2014-03-12 LAB — HEMOGLOBIN A1C
Hgb A1c MFr Bld: 6.9 % — ABNORMAL HIGH (ref <5.7)
Mean Plasma Glucose: 151 mg/dL — ABNORMAL HIGH (ref <117)

## 2014-03-12 LAB — CBC
HCT: 37.4 % — ABNORMAL LOW (ref 39.0–52.0)
Hemoglobin: 11.7 g/dL — ABNORMAL LOW (ref 13.0–17.0)
MCH: 27 pg (ref 26.0–34.0)
MCHC: 31.3 g/dL (ref 30.0–36.0)
MCV: 86.2 fL (ref 78.0–100.0)
PLATELETS: 263 10*3/uL (ref 150–400)
RBC: 4.34 MIL/uL (ref 4.22–5.81)
RDW: 16.3 % — ABNORMAL HIGH (ref 11.5–15.5)
WBC: 9.1 10*3/uL (ref 4.0–10.5)

## 2014-03-12 LAB — PROTIME-INR
INR: 1.76 — ABNORMAL HIGH (ref 0.00–1.49)
Prothrombin Time: 20.7 seconds — ABNORMAL HIGH (ref 11.6–15.2)

## 2014-03-12 LAB — TROPONIN I: TROPONIN I: 0.07 ng/mL — AB (ref <0.031)

## 2014-03-12 MED ORDER — SODIUM CHLORIDE 0.9 % IV SOLN
INTRAVENOUS | Status: AC
  Administered 2014-03-12: 14:00:00 via INTRAVENOUS

## 2014-03-12 MED ORDER — CHLORHEXIDINE GLUCONATE CLOTH 2 % EX PADS
6.0000 | MEDICATED_PAD | Freq: Every day | CUTANEOUS | Status: DC
  Administered 2014-03-12 – 2014-03-14 (×3): 6 via TOPICAL

## 2014-03-12 MED ORDER — MUPIROCIN 2 % EX OINT
1.0000 "application " | TOPICAL_OINTMENT | Freq: Two times a day (BID) | CUTANEOUS | Status: DC
  Administered 2014-03-12 – 2014-03-14 (×6): 1 via NASAL
  Filled 2014-03-12: qty 22

## 2014-03-12 MED ORDER — WARFARIN SODIUM 6 MG PO TABS
6.0000 mg | ORAL_TABLET | Freq: Once | ORAL | Status: AC
  Administered 2014-03-12: 6 mg via ORAL
  Filled 2014-03-12: qty 1

## 2014-03-12 NOTE — Progress Notes (Signed)
[place] for Warfarin Indication: Hx DVT  No Known Allergies  Patient Measurements: Height: 5\' 7"  (170.2 cm) Weight: 198 lb 1 oz (89.841 kg) IBW/kg (Calculated) : 66.1  Vital Signs: Temp: 101 F (38.3 C) (01/25 0622) Temp Source: Rectal (01/25 0622) BP: 115/53 mmHg (01/25 0622) Pulse Rate: 70 (01/25 0622)  Labs:  Recent Labs  03/10/14 1859 03/11/14 1004 03/11/14 1503 03/11/14 2051 03/12/14 0251 03/12/14 0447  HGB 14.2 13.2  --   --   --  11.7*  HCT 43.3 41.0  --   --   --  37.4*  PLT 316 330  --   --   --  263  LABPROT  --   --  21.0*  --   --  20.7*  INR  --   --  1.79*  --   --  1.76*  CREATININE 1.66* 1.71*  --   --   --  1.74*  TROPONINI  --   --  0.04* 0.05* 0.07*  --     Estimated Creatinine Clearance: 32 mL/min (by C-G formula based on Cr of 1.74).  Assessment: 89yoM admitted 1/24 with AMS, fever, UA c/w UTI.  PMH includes ESBL UTI, MI, DM, HTN, HLD and DVT on chronic warfarin.  Home warfarin dose reported as 5 mg daily (last dose unknown).  Admission INR 1.79 is subtherapeutic.  Pharmacy is consulted to continue inpatient warfarin dosing.  Today, 03/12/2014:  INR 1.76, remains subtherapeutic  CBC: Hgb decreased to 11.7, Plt 263  No bleeding or complications reported.  SCr remains elevated, 1.74  Diet: Heart healthy  Drug-drug interactions: Plavix for prior MI; now on broad spectrum abx in hospital  Goal of Therapy:  INR 2-3    Plan:   Warfarin 6 mg po x 1 tonight  He may require lower doses once INR therapeutic as acute illness and abx will lower warfarin requirement  Daily INR, CBC at least q72 hr while on warfarin  F/u for signs of bleeding   Gretta Arab PharmD, BCPS Pager [phone] 03/12/2014 10:09 AM

## 2014-03-12 NOTE — Progress Notes (Signed)
Patient's troponins 0.04, 0.05, and 0.07. Pt in no distress. BP 115/53, HR 71, temp 101. Dr.Abrol notified. Will pass information to oncoming nurse.

## 2014-03-12 NOTE — Progress Notes (Addendum)
[facility] PROGRESS NOTE  Randy [name] MVE:[id number] DOB: 02-28-1924 DOA: 03/11/2014 PCP: Pcp Not In System  Assessment/Plan: Active Problems:   Pyelonephritis    Sepsis secondary to pyelonephritis/ESBL UTI, with gram-negative bacteremia Chest x-ray negative Patient placed on Primaxin given history of ESBL Blood culture positive for gram-negative bacteria, urine culture positive Awaiting sensitivity panel   Chronic kidney disease, stage III Creatinine at baseline Increase IV fluids to 1 25cc/h     Diabetes mellitus-controlled We'll check a hemoglobin A1c Continue sliding scale insulin   Acute encephalopathy: Likely secondary to urinary tract infection and sepsis  Hx of MI: s/p stent placement per patient. On plavix at home. No chest pain on admission. - continue Plavix - Continue Lipitor - hold parameters placed on coreg  Hx of R leg DVT: On Coumadin, and INR 1.7 -Continue Coumadin per pharmacy   Code Status: full Family Communication: family updated about patient's clinical progress Disposition Plan:  As above    Brief narrative: Randy [name] is a 81 y.o. male with PMHx of MI, HTN, DM, HLD presenting to the ED with AMS.Caregiver told EMS pt is more confused than normal today. Pt has been having increased frequency of urination and has had a strong odor to urine. Caregiver reports last time pt had similar symptoms he was diagnosed with a UTI.He was seen in the ED 1/23 , rx with rocephine and DC home on keflex which he was unable to fill . Today he presents from home and he was noted to have a fever. In the ED his fever was recorded as 102.7 . UA shows WBCs too numerous to count. Patient very weak and clinically dehydrated  Consultants:  None  Procedures:  None  Antibiotics: primaxin  HPI/Subjective: Patient continues to be febrile, agitated, confused  Objective: Filed Vitals:   03/11/14 2049 03/11/14 2323 03/12/14 0234 03/12/14 0622  BP:     115/53  Pulse:    70  Temp: 102.4 F (39.1 C) 102 F (38.9 C) 101.2 F (38.4 C) 101 F (38.3 C)  TempSrc: Rectal Rectal Rectal Rectal  Resp:    18  Height:      Weight:      SpO2:    98%    Intake/Output Summary (Last 24 hours) at 03/12/14 1254 Last data filed at 03/12/14 1215  Gross per 24 hour  Intake    880 ml  Output    152 ml  Net    728 ml    Exam:  General: Disoriented, does not appear to be short of breath or any acute distress  Cardiovascular: RRR, nl S1 s2  Respiratory: Decreased breath sounds at the bases, scattered rhonchi, no crackles  Abdomen: soft +BS NT/ND, no masses palpable  Extremities: No cyanosis and no edema      Data Reviewed: Basic Metabolic Panel:  Recent Labs Lab 03/10/14 1859 03/11/14 1004 03/11/14 1503 03/12/14 0447  NA 136 135  --  139  K 4.2 4.3  --  4.2  CL 104 102  --  104  CO2 25 24  --  24  GLUCOSE 172* 180*  --  170*  BUN 23 22  --  23  CREATININE 1.66* 1.71*  --  1.74*  CALCIUM 9.0 8.9  --  8.6  MG  --   --  1.7  --     Liver Function Tests:  Recent Labs Lab 03/11/14 1503 03/12/14 0447  AST 26 22  ALT 20 18  ALKPHOS 66 58  BILITOT 0.4 0.6  PROT 7.7 7.0  ALBUMIN 3.4* 3.0*   No results for input(s): LIPASE, AMYLASE in the last 168 hours. No results for input(s): AMMONIA in the last 168 hours.  CBC:  Recent Labs Lab 03/10/14 1859 03/11/14 1004 03/12/14 0447  WBC 8.3 8.1 9.1  NEUTROABS 6.7 6.1  --   HGB 14.2 13.2 11.7*  HCT 43.3 41.0 37.4*  MCV 86.1 85.8 86.2  PLT 316 330 263    Cardiac Enzymes:  Recent Labs Lab 03/11/14 1503 03/11/14 2051 03/12/14 0251  TROPONINI 0.04* 0.05* 0.07*   BNP (last 3 results)  Recent Labs  12/20/13 0128 01/03/14 2220  PROBNP 3532.0* 1521.0*     CBG:  Recent Labs Lab 03/11/14 1640 03/11/14 2207 03/12/14 0803  GLUCAP 178* 184* 150*    Recent Results (from the past 240 hour(s))  Urine culture     Status: None (Preliminary result)   Collection  Time: 03/10/14  6:40 PM  Result Value Ref Range Status   Specimen Description URINE, CATHETERIZED  Final   Special Requests NONE  Final   Colony Count   Final    >=100,000 COLONIES/ML Performed at [facility]    Culture   Final    ESCHERICHIA COLI Performed at [facility]    Report Status PENDING  Incomplete  Culture, blood (routine x 2)     Status: None (Preliminary result)   Collection Time: 03/11/14 10:04 AM  Result Value Ref Range Status   Specimen Description BLOOD RIGHT ANTECUBITAL  Final   Special Requests BOTTLES DRAWN AEROBIC AND ANAEROBIC 5 CC EACH  Final   Culture   Final    GRAM NEGATIVE RODS Note: Gram Stain Report Called to,Read Back By and Verified With: CHLOE A. RN @ 1215AM 03/12/14 [place] Performed at [facility]    Report Status PENDING  Incomplete  Culture, blood (routine x 2)     Status: None (Preliminary result)   Collection Time: 03/11/14 10:20 AM  Result Value Ref Range Status   Specimen Description BLOOD LEFT ANTECUBITAL  Final   Special Requests BOTTLES DRAWN AEROBIC AND ANAEROBIC 3 CC EACH  Final   Culture   Final           BLOOD CULTURE RECEIVED NO GROWTH TO DATE CULTURE WILL BE HELD FOR 5 DAYS BEFORE ISSUING A FINAL NEGATIVE REPORT Performed at [facility]    Report Status PENDING  Incomplete  MRSA PCR Screening     Status: Abnormal   Collection Time: 03/11/14  9:39 PM  Result Value Ref Range Status   MRSA by PCR POSITIVE (A) NEGATIVE Final    Comment:        The GeneXpert MRSA Assay (FDA approved for NASAL specimens only), is one component of a comprehensive MRSA colonization surveillance program. It is not intended to diagnose MRSA infection nor to guide or monitor treatment for MRSA infections. RESULT CALLED TO, READ BACK BY AND VERIFIED WITH: SPOKE WITH ADDISON,C RN [phone] [place],N      Studies: Dg Chest 2 View  03/10/2014   CLINICAL DATA:  Increased confusion today. Increased urination  and malodorous urine.  EXAM: CHEST  2 VIEW  COMPARISON:  01/03/2014 and 12/20/2013.  FINDINGS: 1923 hr. There are low lung volumes with patient rotation to the right. The left subclavian pacemaker leads appear unchanged within the right atrium and right ventricle. The heart size and mediastinal contours are stable. The lungs appear unchanged. I believe there  is mild chronic scarring at the left base. There is no pleural effusion or pneumothorax. No acute osseous findings identified.  IMPRESSION: Stable radiographic appearance of the chest. No acute cardiopulmonary process.   Electronically Signed   By: Camie Patience M.D.   On: 03/10/2014 19:31    Scheduled Meds: . atorvastatin  5 mg Oral Daily  . carvedilol  3.125 mg Oral BID WC  . Chlorhexidine Gluconate Cloth  6 each Topical Q0600  . clopidogrel  75 mg Oral Daily  . feeding supplement (ENSURE COMPLETE)  237 mL Oral BID BM  . imipenem-cilastatin  250 mg Intravenous 4 times per day  . Influenza vac split quadrivalent PF  0.5 mL Intramuscular Tomorrow-1000  . insulin aspart  0-9 Units Subcutaneous TID WC  . insulin NPH Human  16 Units Subcutaneous BID AC  . mupirocin ointment  1 application Nasal BID  . pneumococcal 23 valent vaccine  0.5 mL Intramuscular Tomorrow-1000  . sodium chloride  3 mL Intravenous Q12H  . tamsulosin  0.4 mg Oral Daily  . warfarin  6 mg Oral ONCE-1800  . Warfarin - Pharmacist Dosing Inpatient   Does not apply q1800   Continuous Infusions:   Active Problems:   Pyelonephritis    Time spent: 40 minutes   [facility] Pager [phone]. If 7PM-7AM, please contact night-coverage at [email or web address], password [facility] 03/12/2014, 12:54 PM  LOS: 1 day

## 2014-03-12 NOTE — Progress Notes (Signed)
Received call from [facility] with positive blood culture gram negative rods. NP on call made aware. No new orders placed. Pt on Primaxin. Will continue to monitor closely

## 2014-03-12 NOTE — Progress Notes (Addendum)
CRITICAL VALUE ALERT  Critical value received:  MRSA positive  Date of notification:  03/12/14  Time of notification:  0004  Critical value read back:Yes.    Nurse who received alert:  Virgina Norfolk, RN  MD notified (1st page):  Rogue Bussing   Time of first page:  0006  MD notified (2nd page):  Time of second page:  Responding MD:  Rogue Bussing  Time MD responded:  [phone]

## 2014-03-12 NOTE — Progress Notes (Signed)
CRITICAL VALUE ALERT  Critical value received:  Anaerobic bottle positive for gram negative rods  Date of notification:  03/12/14  Time of notification:  0016  Critical value read back:Yes.    Nurse who received alert:  Virgina Norfolk   MD notified (1st page):  Rogue Bussing   Time of first page:  0017  MD notified (2nd page):  Time of second page:  Responding MD:  Rogue Bussing   Time MD responded:  [phone]

## 2014-03-13 ENCOUNTER — Inpatient Hospital Stay (HOSPITAL_COMMUNITY): Payer: Medicare Other

## 2014-03-13 DIAGNOSIS — A4150 Gram-negative sepsis, unspecified: Secondary | ICD-10-CM | POA: Diagnosis not present

## 2014-03-13 LAB — COMPREHENSIVE METABOLIC PANEL
ALT: 19 U/L (ref 0–53)
ANION GAP: 9 (ref 5–15)
AST: 26 U/L (ref 0–37)
Albumin: 2.9 g/dL — ABNORMAL LOW (ref 3.5–5.2)
Alkaline Phosphatase: 56 U/L (ref 39–117)
BILIRUBIN TOTAL: 0.6 mg/dL (ref 0.3–1.2)
BUN: 25 mg/dL — AB (ref 6–23)
CALCIUM: 8.3 mg/dL — AB (ref 8.4–10.5)
CO2: 24 mmol/L (ref 19–32)
Chloride: 105 mmol/L (ref 96–112)
Creatinine, Ser: 1.54 mg/dL — ABNORMAL HIGH (ref 0.50–1.35)
GFR calc Af Amer: 45 mL/min — ABNORMAL LOW (ref >90)
GFR calc non Af Amer: 39 mL/min — ABNORMAL LOW (ref >90)
GLUCOSE: 104 mg/dL — AB (ref 70–99)
POTASSIUM: 3.9 mmol/L (ref 3.5–5.1)
Sodium: 138 mmol/L (ref 135–145)
TOTAL PROTEIN: 6.7 g/dL (ref 6.0–8.3)

## 2014-03-13 LAB — CBC
HCT: 36.9 % — ABNORMAL LOW (ref 39.0–52.0)
Hemoglobin: 11.7 g/dL — ABNORMAL LOW (ref 13.0–17.0)
MCH: 27.3 pg (ref 26.0–34.0)
MCHC: 31.7 g/dL (ref 30.0–36.0)
MCV: 86 fL (ref 78.0–100.0)
PLATELETS: 252 10*3/uL (ref 150–400)
RBC: 4.29 MIL/uL (ref 4.22–5.81)
RDW: 16.4 % — AB (ref 11.5–15.5)
WBC: 8.6 10*3/uL (ref 4.0–10.5)

## 2014-03-13 LAB — URINE CULTURE: Colony Count: >=100000

## 2014-03-13 LAB — PROTIME-INR
INR: 1.58 — ABNORMAL HIGH (ref 0.00–1.49)
PROTHROMBIN TIME: 19.1 s — AB (ref 11.6–15.2)

## 2014-03-13 LAB — GLUCOSE, CAPILLARY
GLUCOSE-CAPILLARY: 97 mg/dL (ref 70–99)
GLUCOSE-CAPILLARY: 182 mg/dL — AB (ref 70–99)
Glucose-Capillary: 138 mg/dL — ABNORMAL HIGH (ref 70–99)
Glucose-Capillary: 192 mg/dL — ABNORMAL HIGH (ref 70–99)
Glucose-Capillary: 110 mg/dL — ABNORMAL HIGH (ref 70–99)

## 2014-03-13 MED ORDER — SODIUM CHLORIDE 0.9 % IV SOLN
1.0000 g | INTRAVENOUS | Status: DC
  Administered 2014-03-13 – 2014-03-14 (×2): 1 g via INTRAVENOUS
  Filled 2014-03-13 (×2): qty 1

## 2014-03-13 MED ORDER — WARFARIN SODIUM 7.5 MG PO TABS
7.5000 mg | ORAL_TABLET | Freq: Once | ORAL | Status: AC
  Administered 2014-03-13: 7.5 mg via ORAL
  Filled 2014-03-13: qty 1

## 2014-03-13 MED ORDER — SODIUM CHLORIDE 0.9 % IJ SOLN
10.0000 mL | INTRAMUSCULAR | Status: DC | PRN
  Administered 2014-03-14 (×2): 10 mL
  Filled 2014-03-13 (×2): qty 40

## 2014-03-13 NOTE — Progress Notes (Signed)
Peripherally Inserted Central Catheter/Midline Placement  The IV Nurse has discussed with the patient and/or persons authorized to consent for the patient, the purpose of this procedure and the potential benefits and risks involved with this procedure.  The benefits include less needle sticks, lab draws from the catheter and patient may be discharged home with the catheter.  Risks include, but not limited to, infection, bleeding, blood clot (thrombus formation), and puncture of an artery; nerve damage and irregular heat beat.  Alternatives to this procedure were also discussed.  PICC/Midline Placement Documentation        Randy [name] 03/13/2014, 12:17 PM

## 2014-03-13 NOTE — Progress Notes (Addendum)
[facility] PROGRESS NOTE  Randy [name] PPJ:[id number] DOB: 07-25-1924 DOA: 03/11/2014 PCP: Pcp Not In System  Assessment/Plan: Active Problems:   Pyelonephritis    Sepsis secondary to pyelonephritis/ESBL UTI, with gram-negative bacteremia Chest x-ray negative Patient placed on Primaxin given history of ESBL, will switch to ertapenem, PICC line placement Given positive blood cultures will need a total of 10-14 days of IV antibiotics, discussed with Dr. Megan Salon Awaiting sensitivity panel PICC line placement  Chronic kidney disease, stage III Creatinine at baseline Hold fluids and continue to follow    Diabetes mellitus-controlled Hemoglobin A1c 6.9 Continue sliding scale insulin   Acute encephalopathy: Likely secondary to urinary tract infection and sepsis  Hx of MI: s/p stent placement per patient. On plavix at home. No chest pain on admission. - continue Plavix - Continue Lipitor - hold parameters placed on coreg  Hx of R leg DVT: On Coumadin, INR subtherapeutic -Continue Coumadin per pharmacy   Code Status: full Family Communication: family updated about patient's clinical progress Disposition Plan:  PT/OT eval   Brief narrative: Randy [name] is a 81 y.o. male with PMHx of MI, HTN, DM, HLD presenting to the ED with AMS.Caregiver told EMS pt is more confused than normal today. Pt has been having increased frequency of urination and has had a strong odor to urine. Caregiver reports last time pt had similar symptoms he was diagnosed with a UTI.He was seen in the ED 1/23 , rx with rocephine and DC home on keflex which he was unable to fill . Today he presents from home and he was noted to have a fever. In the ED his fever was recorded as 102.7 . UA shows WBCs too numerous to count. Patient very weak and clinically dehydrated  Consultants:  None  Procedures:  None  Antibiotics: primaxin  HPI/Subjective: Patient continues to be febrile, agitated,  confused  Objective: Filed Vitals:   03/12/14 0622 03/12/14 1430 03/12/14 2143 03/13/14 0414  BP: 115/53 161/79 101/54 138/58  Pulse: 70 78 69 67  Temp: 101 F (38.3 C) 99 F (37.2 C) 98 F (36.7 C) 98 F (36.7 C)  TempSrc: Rectal Oral Oral Oral  Resp: 18 18 18 19   Height:      Weight:      SpO2: 98% 100% 96% 100%    Intake/Output Summary (Last 24 hours) at 03/13/14 1047 Last data filed at 03/13/14 0511  Gross per 24 hour  Intake 1113.75 ml  Output    750 ml  Net 363.75 ml    Exam:  General: Disoriented, does not appear to be short of breath or any acute distress  Cardiovascular: RRR, nl S1 s2  Respiratory: Decreased breath sounds at the bases, scattered rhonchi, no crackles  Abdomen: soft +BS NT/ND, no masses palpable  Extremities: No cyanosis and no edema      Data Reviewed: Basic Metabolic Panel:  Recent Labs Lab 03/10/14 1859 03/11/14 1004 03/11/14 1503 03/12/14 0447 03/13/14 0450  NA 136 135  --  139 138  K 4.2 4.3  --  4.2 3.9  CL 104 102  --  104 105  CO2 25 24  --  24 24  GLUCOSE 172* 180*  --  170* 104*  BUN 23 22  --  23 25*  CREATININE 1.66* 1.71*  --  1.74* 1.54*  CALCIUM 9.0 8.9  --  8.6 8.3*  MG  --   --  1.7  --   --     Liver Function Tests:  Recent Labs Lab 03/11/14 1503 03/12/14 0447 03/13/14 0450  AST 26 22 26   ALT 20 18 19   ALKPHOS 66 58 56  BILITOT 0.4 0.6 0.6  PROT 7.7 7.0 6.7  ALBUMIN 3.4* 3.0* 2.9*   No results for input(s): LIPASE, AMYLASE in the last 168 hours. No results for input(s): AMMONIA in the last 168 hours.  CBC:  Recent Labs Lab 03/10/14 1859 03/11/14 1004 03/12/14 0447 03/13/14 0450  WBC 8.3 8.1 9.1 8.6  NEUTROABS 6.7 6.1  --   --   HGB 14.2 13.2 11.7* 11.7*  HCT 43.3 41.0 37.4* 36.9*  MCV 86.1 85.8 86.2 86.0  PLT 316 330 263 252    Cardiac Enzymes:  Recent Labs Lab 03/11/14 1503 03/11/14 2051 03/12/14 0251  TROPONINI 0.04* 0.05* 0.07*   BNP (last 3 results)  Recent Labs   12/20/13 0128 01/03/14 2220  PROBNP 3532.0* 1521.0*     CBG:  Recent Labs Lab 03/12/14 0803 03/12/14 1152 03/12/14 1724 03/12/14 2145 03/13/14 0735  GLUCAP 150* 230* 136* 138* 97    Recent Results (from the past 240 hour(s))  Urine culture     Status: None   Collection Time: 03/10/14  6:40 PM  Result Value Ref Range Status   Specimen Description URINE, CATHETERIZED  Final   Special Requests NONE  Final   Colony Count   Final    >=100,000 COLONIES/ML Performed at [facility]    Culture   Final    ESCHERICHIA COLI Note: Confirmed Extended Spectrum Beta-Lactamase Producer (ESBL) CRITICAL RESULT CALLED TO, READ BACK BY AND VERIFIED WITH: DANA@9 :00AM ON 03/13/14 BY DANTS Performed at [facility]    Report Status 03/13/2014 FINAL  Final   Organism ID, Bacteria ESCHERICHIA COLI  Final      Susceptibility   Escherichia coli - MIC*    AMPICILLIN >=32 RESISTANT Resistant     CEFAZOLIN >=64 RESISTANT Resistant     CEFTRIAXONE >=64 RESISTANT Resistant     CIPROFLOXACIN >=4 RESISTANT Resistant     GENTAMICIN <=1 SENSITIVE Sensitive     LEVOFLOXACIN >=8 RESISTANT Resistant     NITROFURANTOIN <=16 SENSITIVE Sensitive     TOBRAMYCIN <=1 SENSITIVE Sensitive     TRIMETH/SULFA <=20 SENSITIVE Sensitive     IMIPENEM 1 SENSITIVE Sensitive     PIP/TAZO <=4 SENSITIVE Sensitive     * ESCHERICHIA COLI  Urine culture     Status: None   Collection Time: 03/11/14  3:14 AM  Result Value Ref Range Status   Specimen Description URINE, RANDOM  Final   Special Requests NONE  Final   Colony Count   Final    3,000 COLONIES/ML Performed at [facility]    Culture   Final    INSIGNIFICANT GROWTH Performed at [facility]    Report Status 03/13/2014 FINAL  Final  Culture, blood (routine x 2)     Status: None (Preliminary result)   Collection Time: 03/11/14 10:04 AM  Result Value Ref Range Status   Specimen Description BLOOD RIGHT ANTECUBITAL  Final    Special Requests BOTTLES DRAWN AEROBIC AND ANAEROBIC 5 CC EACH  Final   Culture   Final    GRAM NEGATIVE RODS Note: Gram Stain Report Called to,Read Back By and Verified With: CHLOE A. RN @ 1215AM 03/12/14 [place] Performed at [facility]    Report Status PENDING  Incomplete  Culture, blood (routine x 2)     Status: None (Preliminary result)  Collection Time: 03/11/14 10:20 AM  Result Value Ref Range Status   Specimen Description BLOOD LEFT ANTECUBITAL  Final   Special Requests BOTTLES DRAWN AEROBIC AND ANAEROBIC 3 CC EACH  Final   Culture   Final           BLOOD CULTURE RECEIVED NO GROWTH TO DATE CULTURE WILL BE HELD FOR 5 DAYS BEFORE ISSUING A FINAL NEGATIVE REPORT Performed at [facility]    Report Status PENDING  Incomplete  MRSA PCR Screening     Status: Abnormal   Collection Time: 03/11/14  9:39 PM  Result Value Ref Range Status   MRSA by PCR POSITIVE (A) NEGATIVE Final    Comment:        The GeneXpert MRSA Assay (FDA approved for NASAL specimens only), is one component of a comprehensive MRSA colonization surveillance program. It is not intended to diagnose MRSA infection nor to guide or monitor treatment for MRSA infections. RESULT CALLED TO, READ BACK BY AND VERIFIED WITH: SPOKE WITH ADDISON,C RN [phone] [place],N      Studies: Dg Chest 2 View  03/10/2014   CLINICAL DATA:  Increased confusion today. Increased urination and malodorous urine.  EXAM: CHEST  2 VIEW  COMPARISON:  01/03/2014 and 12/20/2013.  FINDINGS: 1923 hr. There are low lung volumes with patient rotation to the right. The left subclavian pacemaker leads appear unchanged within the right atrium and right ventricle. The heart size and mediastinal contours are stable. The lungs appear unchanged. I believe there is mild chronic scarring at the left base. There is no pleural effusion or pneumothorax. No acute osseous findings identified.  IMPRESSION: Stable radiographic appearance of the  chest. No acute cardiopulmonary process.   Electronically Signed   By: Camie Patience M.D.   On: 03/10/2014 19:31    Scheduled Meds: . atorvastatin  5 mg Oral Daily  . carvedilol  3.125 mg Oral BID WC  . Chlorhexidine Gluconate Cloth  6 each Topical Q0600  . clopidogrel  75 mg Oral Daily  . ertapenem  1 g Intravenous Q24H  . feeding supplement (ENSURE COMPLETE)  237 mL Oral BID BM  . insulin aspart  0-9 Units Subcutaneous TID WC  . insulin NPH Human  16 Units Subcutaneous BID AC  . mupirocin ointment  1 application Nasal BID  . sodium chloride  3 mL Intravenous Q12H  . tamsulosin  0.4 mg Oral Daily  . warfarin  7.5 mg Oral Once  . Warfarin - Pharmacist Dosing Inpatient   Does not apply q1800   Continuous Infusions:   Active Problems:   Pyelonephritis    Time spent: 40 minutes   [facility] Pager [phone]. If 7PM-7AM, please contact night-coverage at [email or web address], password [facility] 03/13/2014, 10:47 AM  LOS: 2 days

## 2014-03-13 NOTE — Progress Notes (Signed)
[place] for Warfarin Indication: Hx DVT  No Known Allergies  Patient Measurements: Height: 5\' 7"  (170.2 cm) Weight: 198 lb 1 oz (89.841 kg) IBW/kg (Calculated) : 66.1  Vital Signs: Temp: 98 F (36.7 C) (01/26 0414) Temp Source: Oral (01/26 0414) BP: 138/58 mmHg (01/26 0414) Pulse Rate: 67 (01/26 0414)  Labs:  Recent Labs  03/11/14 1004 03/11/14 1503 03/11/14 2051 03/12/14 0251 03/12/14 0447 03/13/14 0450  HGB 13.2  --   --   --  11.7* 11.7*  HCT 41.0  --   --   --  37.4* 36.9*  PLT 330  --   --   --  263 252  LABPROT  --  21.0*  --   --  20.7* 19.1*  INR  --  1.79*  --   --  1.76* 1.58*  CREATININE 1.71*  --   --   --  1.74* 1.54*  TROPONINI  --  0.04* 0.05* 0.07*  --   --    Estimated Creatinine Clearance: 36.1 mL/min (by C-G formula based on Cr of 1.54).  Assessment: 89yoM admitted 1/24 with AMS, fever, UA c/w UTI.  PMH includes ESBL UTI, MI, DM, HTN, HLD and DVT on chronic warfarin.  Home warfarin dose reported as 5 mg daily (last dose unknown).  Admission INR 1.79 is subtherapeutic.  Pharmacy is consulted to continue inpatient warfarin dosing.  Today, 03/13/2014:  INR 1.58, remains subtherapeutic and decreased despite increased warfarin dosage  CBC: Hgb low/stable at 11.7, Plt remain WNL  No bleeding or complications reported.  SCr remains elevated but improved, 1.54  Diet: Heart healthy, Ensure supplement BID  Drug-drug interactions: Plavix for prior MI; broad spectrum abx  Goal of Therapy:  INR 2-3   Plan:   Warfarin 7.5 mg PO x1 dose today  Daily INR, CBC at least q72 hr while on warfarin  F/u for signs of bleeding   Gretta Arab PharmD, BCPS Pager [phone] 03/13/2014 8:23 AM

## 2014-03-14 LAB — GLUCOSE, CAPILLARY
Glucose-Capillary: 100 mg/dL — ABNORMAL HIGH (ref 70–99)
Glucose-Capillary: 188 mg/dL — ABNORMAL HIGH (ref 70–99)

## 2014-03-14 LAB — CULTURE, BLOOD (ROUTINE X 2)

## 2014-03-14 LAB — PROTIME-INR
INR: 1.59 — ABNORMAL HIGH (ref 0.00–1.49)
Prothrombin Time: 19.1 seconds — ABNORMAL HIGH (ref 11.6–15.2)

## 2014-03-14 MED ORDER — HEPARIN SOD (PORK) LOCK FLUSH 100 UNIT/ML IV SOLN
250.0000 [IU] | INTRAVENOUS | Status: DC | PRN
  Administered 2014-03-14: 250 [IU]
  Filled 2014-03-14 (×2): qty 3

## 2014-03-14 MED ORDER — ERTAPENEM SODIUM 1 G IJ SOLR: 1.0000 g | INTRAMUSCULAR | Status: DC

## 2014-03-14 MED ORDER — HEPARIN SOD (PORK) LOCK FLUSH 100 UNIT/ML IV SOLN
250.0000 [IU] | Freq: Every day | INTRAVENOUS | Status: DC
  Filled 2014-03-14: qty 3

## 2014-03-14 MED ORDER — WARFARIN SODIUM 7.5 MG PO TABS
7.5000 mg | ORAL_TABLET | Freq: Once | ORAL | Status: AC
  Administered 2014-03-14: 7.5 mg via ORAL
  Filled 2014-03-14: qty 1

## 2014-03-14 NOTE — Evaluation (Signed)
Physical Therapy Evaluation Patient Details Name: Randy [name] MRN: [id number] DOB: [birth year] Today's Date: 03/14/2014   History of Present Illness  Pt is an 81 year old male admitted with AMS and sepsis secondary to pyelonephritis/ESBL UTI, with gram-negative bacteremia.  PMHx pacemaker, CAD, MI, DM    Clinical Impression  Pt admitted with above diagnosis. Pt currently with functional limitations due to the deficits listed below (see PT Problem List).  Pt will benefit from skilled PT to increase their independence and safety with mobility to allow discharge to the venue listed below.  Pt poor historian and unaware of assist required and current condition.  Pt reports he ambulates well without assistive device however unable to stand pivot to [facility] for BM today.  Pt only able to tolerate standing EOB with RW for support.  Recommend SNF upon d/c at this time.     Follow Up Recommendations SNF;Supervision/Assistance - 24 hour    Equipment Recommendations  None recommended by PT    Recommendations for Other Services       Precautions / Restrictions Precautions Precautions: Fall      Mobility  Bed Mobility Overal bed mobility: Needs Assistance Bed Mobility: Supine to Sit;Sit to Supine     Supine to sit: HOB elevated;Supervision Sit to supine: Supervision   General bed mobility comments: increased time and effort however did not require assist  Transfers Overall transfer level: Needs assistance Equipment used: Rolling walker (2 wheeled) Transfers: Sit to/from Stand Sit to Stand: Mod assist         General transfer comment: performed twice, once without AD as pt states he never uses them and pt unsteady and required assist to rise and stabilize, unable to perform full extension on R LE and pt c/o R knee pain, performed again with RW for support however pt continues to require increased assist  Ambulation/Gait                Stairs            Wheelchair  Mobility    Modified Rankin (Stroke Patients Only)       Balance Overall balance assessment: Needs assistance         Standing balance support: Bilateral upper extremity supported Standing balance-Leahy Scale: Poor Standing balance comment: required RW and support for static standing                             Pertinent Vitals/Pain Pain Assessment: No/denies pain    Home Living Family/patient expects to be discharged to:: Private residence   Available Help at Discharge: Family;Personal care attendant;Available PRN/intermittently Type of Home: House Home Access: Ramped entrance     Home Layout: One level Home Equipment: Walker - 2 wheels;Shower seat;Grab bars - tub/shower Additional Comments: above per previous admission, pt poor historian    Prior Function Level of Independence: Independent with assistive device(s)         Comments: poor historian, states he has w/c and RW however "would never use" them     Hand Dominance        Extremity/Trunk Assessment   Upper Extremity Assessment: Defer to OT evaluation           Lower Extremity Assessment: Generalized weakness;RLE deficits/detail RLE Deficits / Details: reports R knee pain during mobility, limited functional extension observed, per knee scar likely possible hx of TKA       Communication   Communication: [facility]  Cognition  Arousal/Alertness: Awake/alert Behavior During Therapy: WFL for tasks assessed/performed Overall Cognitive Status: Within Functional Limits for tasks assessed                      General Comments      Exercises        Assessment/Plan    PT Assessment Patient needs continued PT services  PT Diagnosis Difficulty walking;Generalized weakness   PT Problem List Decreased strength;Decreased range of motion;Decreased mobility;Decreased cognition;Decreased activity tolerance;Decreased knowledge of use of DME;Decreased safety awareness  PT Treatment  Interventions DME instruction;Gait training;Functional mobility training;Patient/family education;Therapeutic activities;Therapeutic exercise;Balance training   PT Goals (Current goals can be found in the Care Plan section) Acute Rehab PT Goals PT Goal Formulation: With patient Time For Goal Achievement: 03/21/14 Potential to Achieve Goals: Good    Frequency Min 3X/week   Barriers to discharge        Co-evaluation               End of Session Equipment Utilized During Treatment: Gait belt Activity Tolerance: Patient limited by pain Patient left: in bed;with call bell/phone within reach;with bed alarm set Nurse Communication: Mobility status (aware pt on bedpan)         Time: 0902-0922 PT Time Calculation (min) (ACUTE ONLY): 20 min   Charges:   PT Evaluation $Initial PT Evaluation Tier I: 1 Procedure PT Treatments $Therapeutic Activity: 8-22 mins   PT G Codes:        Khiana Camino,KATHrine E 03/14/2014, 9:35 AM Carmelia Bake, PT, DPT 03/14/2014 Pager: [phone]

## 2014-03-14 NOTE — Evaluation (Signed)
Occupational Therapy Evaluation Patient Details Name: Randy [name] MRN: [id number] DOB: 01-08-1925 Today's Date: 03/14/2014    History of Present Illness Pt is an 81 year old male admitted with AMS and sepsis secondary to pyelonephritis/ESBL UTI, with gram-negative bacteremia.  PMHx pacemaker, CAD, MI, DM   Clinical Impression   Pt presents w/ dx as above, he appears confused and is a poor historian demonstrating decreased cog/AMS although no family present for baseline. He currently requires Mod A for sit to stand transfers and LB dressing. Pt has posterior lean in sitting at EOB. He will benefit from SNF Rehab followed by 24/7 assist.     Follow Up Recommendations  SNF;Supervision/Assistance - 24 hour    Equipment Recommendations  Other (comment) (Defer to next venue)    Recommendations for Other Services       Precautions / Restrictions Precautions Precautions: Fall      Mobility Bed Mobility Overal bed mobility: Needs Assistance Bed Mobility: Supine to Sit;Sit to Supine     Supine to sit: HOB elevated;Supervision Sit to supine: Supervision   General bed mobility comments: increased time and effort however did not require assist  Transfers Overall transfer level: Needs assistance Equipment used: Rolling walker (2 wheeled) Transfers: Sit to/from Stand Sit to Stand: Mod assist         General transfer comment: performed twice, once without AD as pt states he never uses them and pt unsteady and required assist to rise and stabilize, unable to perform full extension on R LE and pt c/o R knee pain, performed again with RW for support however pt continues to require increased assist    Balance Overall balance assessment: Needs assistance Sitting-balance support: No upper extremity supported;Feet supported Sitting balance-Leahy Scale: Fair   Postural control: Posterior lean Standing balance support: Bilateral upper extremity supported Standing balance-Leahy Scale:  Poor Standing balance comment: Required RW & support for static standing                            ADL Overall ADL's : Needs assistance/impaired     Grooming: Wash/dry hands;Wash/dry face;Set up;Sitting   Upper Body Bathing: Set up;Sitting;Supervision/ safety   Lower Body Bathing: Sit to/from stand;Moderate assistance   Upper Body Dressing : Set up;Sitting;Supervision/safety   Lower Body Dressing: Sit to/from stand;Moderate assistance   Toilet Transfer: BSC;Stand-pivot;RW;+2 for physical assistance;Moderate assistance   Toileting- Clothing Manipulation and Hygiene: +2 for physical assistance;+2 for safety/equipment;Sit to/from stand       Functional mobility during ADLs: +2 for physical assistance;Rolling walker (Pt poor historian, needs assist although states he does not) General ADL Comments: Pt appears confused and is a poor historian demonstrating decreased cog/AMS although no family present for baseline. Pt stated he lived alone, then stated he lives w/ daughter, then stated that he has assist w/ ADL's, then stated he does not have assist with ADL's, self care or homemaking. Pt participated in ADL retraining session after assessment for grooming and UB bathing sitting at EOB. Pt required frequent verbal and tactile cues due to posterior lean in sitting.     Vision  NT                   Perception     Praxis      Pertinent Vitals/Pain Pain Assessment: No/denies pain     Hand Dominance     Extremity/Trunk Assessment Upper Extremity Assessment Upper Extremity Assessment: Generalized weakness   Lower Extremity  Assessment Lower Extremity Assessment: Defer to PT evaluation RLE Deficits / Details: reports R knee pain during mobility, limited functional extension observed, per knee scar likely possible hx of TKA       Communication Communication Communication: HOH   Cognition Arousal/Alertness: Awake/alert Behavior During Therapy: WFL for tasks  assessed/performed Overall Cognitive Status: No family/caregiver present to determine baseline cognitive functioning       Memory: Decreased short-term memory             General Comments       Exercises       Shoulder Instructions      Home Living Family/patient expects to be discharged to:: Private residence Living Arrangements: Children Available Help at Discharge: Family;Personal care attendant;Available PRN/intermittently Type of Home: House Home Access: Ramped entrance     Home Layout: One level               Home Equipment: Walker - 2 wheels;Shower seat;Grab bars - tub/shower   Additional Comments: above per previous admission, pt poor historian      Prior Functioning/Environment Level of Independence: Independent with assistive device(s)        Comments: poor historian, states he has w/c and RW however "would never use" them    OT Diagnosis: Generalized weakness;Cognitive deficits;Altered mental status   OT Problem List: Decreased strength;Decreased activity tolerance;Impaired balance (sitting and/or standing);Decreased knowledge of use of DME or AE;Decreased safety awareness;Decreased cognition;Decreased knowledge of precautions   OT Treatment/Interventions:      OT Goals(Current goals can be found in the care plan section) Acute Rehab OT Goals Patient Stated Goal: I want to go home  OT Frequency:     Barriers to D/C:            Co-evaluation              End of Session Equipment Utilized During Treatment: Gait belt;Rolling walker  Activity Tolerance: Patient tolerated treatment well Patient left: with call bell/phone within reach;in bed;with bed alarm set   Time: 1000-1020 OT Time Calculation (min): 20 min Charges:  OT General Charges $OT Visit: 1 Procedure OT Evaluation $Initial OT Evaluation Tier I: 1 Procedure G-Codes:    Almyra Deforest, OTR/L 03/14/2014, 10:35 AM

## 2014-03-14 NOTE — Plan of Care (Signed)
Problem: Phase II Progression Outcomes Goal: Voiding independently Outcome: Adequate for Discharge Home with 24 hour caregiver  Problem: Phase III Progression Outcomes Goal: IV Medications to PO Outcome: Adequate for Discharge Pt getting IV abx at home

## 2014-03-14 NOTE — Progress Notes (Signed)
Spoke with pt's daughter Caren Griffins [phone] concerning discharge plans and needs. Caren Griffins selected [facility] for [facility] needs. Referral given to in house rep. Caren Griffins also stated pt will need transportation home. Claiborne Billings, [place] aware.

## 2014-03-14 NOTE — Progress Notes (Signed)
ANTICOAGULATION CONSULT NOTE   Pharmacy Consult for Warfarin Indication: Hx DVT  No Known Allergies  Patient Measurements: Height: 5\' 7"  (170.2 cm) Weight: 198 lb 1 oz (89.841 kg) IBW/kg (Calculated) : 66.1  Vital Signs: Temp: 97.7 F (36.5 C) (01/27 0607) Temp Source: Oral (01/27 0607) BP: 131/63 mmHg (01/27 0607) Pulse Rate: 70 (01/27 0607)  Labs:  Recent Labs  03/11/14 1004  03/11/14 1503 03/11/14 2051 03/12/14 0251 03/12/14 0447 03/13/14 0450 03/14/14 0600  HGB 13.2  --   --   --   --  11.7* 11.7*  --   HCT 41.0  --   --   --   --  37.4* 36.9*  --   PLT 330  --   --   --   --  263 252  --   LABPROT  --   < > 21.0*  --   --  20.7* 19.1* 19.1*  INR  --   < > 1.79*  --   --  1.76* 1.58* 1.59*  CREATININE 1.71*  --   --   --   --  1.74* 1.54*  --   TROPONINI  --   --  0.04* 0.05* 0.07*  --   --   --   < > = values in this interval not displayed. Estimated Creatinine Clearance: 36.1 mL/min (by C-G formula based on Cr of 1.54).  Assessment: 89yoM admitted 1/24 with AMS, fever, UA c/w UTI.  PMH includes ESBL UTI, MI, DM, HTN, HLD and DVT on chronic warfarin.  Home warfarin dose reported as 5 mg daily (last dose unknown).  Admission INR 1.79 is subtherapeutic.  Pharmacy is consulted to continue inpatient warfarin dosing.  Today, 03/14/2014:  INR 1.59, remains subtherapeutic despite increased warfarin dosage  CBC: Hgb low/stable at 11.7, Plt remain WNL (1/26)  No bleeding or complications reported.  SCr remains elevated but improved, 1.54  Diet: Heart healthy, Ensure supplement BID  Drug-drug interactions: Plavix for prior MI; broad spectrum abx  Goal of Therapy:  INR 2-3   Plan:   Warfarin 7.5 mg PO x1 dose today  Daily INR, CBC at least q72 hr while on warfarin  Monitor for signs of bleeding   Gretta Arab PharmD, BCPS Pager [phone] 03/14/2014 9:01 AM

## 2014-03-14 NOTE — Progress Notes (Signed)
CSW reviewed PT evaluation recommending SNF at discharge - CSW spoke with patient's daughter, Caren Griffins via phone (ph#: [phone]) who states that they would prefer that he return home at discharge, that they can provide 24 hour care. RNCM, Cookie aware & will set up for home health services. Patient's daughter states that they would need non-emergent ambulance for transport home - CSW will arrange when ready.   No further CSW needs identified - CSW signing off.   Raynaldo Opitz, [facility] Clinical Social Worker cell #: [phone]

## 2014-03-14 NOTE — Progress Notes (Signed)
[facility] called for transport.     Cederick Broadnax, LCSW [facility] Clinical Social Worker cell #: [phone]  

## 2014-03-14 NOTE — Discharge Summary (Addendum)
Physician Discharge Summary  Randy [name] MRN: [id number] DOB/AGE: 79/16/1926 79 y.o.  PCP: Pcp Not In System   Admit date: 03/11/2014 Discharge date: 03/14/2014  Discharge Diagnoses:  ESBL UTI/ bacteremia . Diabetes mellitus without complication   . Anxiety   . MI (myocardial infarction)   . Hypertension   . Hyperlipidemia    Follow-up recommendations Follow-up primary care provider in 5-7 days Follow-up INR 1/28 and then every 3 days Follow-up CBC, BMP in one week    Medication List    STOP taking these medications        cephALEXin 500 MG capsule  Commonly known as:  [place] these medications        antiseptic oral rinse 0.05 % Liqd solution  Commonly known as:  CPC / CETYLPYRIDINIUM CHLORIDE 0.05%  7 mLs by Mouth Rinse route 2 (two) times daily.     atorvastatin 10 MG tablet  Commonly known as:  LIPITOR  Take 5 mg by mouth daily.     busPIRone 15 MG tablet  Commonly known as:  BUSPAR  Take 1 tablet (15 mg total) by mouth 2 (two) times daily.     carvedilol 3.125 MG tablet  Commonly known as:  COREG  Take 3.125 mg by mouth 2 (two) times daily with a meal.     clopidogrel 75 MG tablet  Commonly known as:  PLAVIX  Take 75 mg by mouth daily.     DESITIN EX  Apply 1 application topically as needed.     ertapenem 1 g in sodium chloride 0.9 % 50 mL  Inject 1 g into the vein daily.     eucerin cream  Apply 1 application topically as needed for dry skin.     feeding supplement (ENSURE COMPLETE) Liqd  Take 237 mLs by mouth 2 (two) times daily between meals.     insulin NPH Human 100 UNIT/ML injection  Commonly known as:  HUMULIN N,NOVOLIN N  Inject 16 Units into the skin 2 (two) times daily before a meal.     ranitidine 150 MG capsule  Commonly known as:  ZANTAC  Take 150 mg by mouth 2 (two) times daily.     tamsulosin 0.4 MG Caps capsule  Commonly known as:  FLOMAX  Take 1 capsule (0.4 mg total) by mouth daily.      warfarin 5 MG tablet  Commonly known as:  COUMADIN  Take 5 mg by mouth daily.     warfarin 7.5 MG tablet  Commonly known as:  COUMADIN  Take 1 tablet (7.5 mg total) by mouth one time only at 6 PM.        Discharge Condition:    Disposition: 01-Home or Self Care   Consults:  None   Significant Diagnostic Studies: Dg Chest 2 View  03/10/2014   CLINICAL DATA:  Increased confusion today. Increased urination and malodorous urine.  EXAM: CHEST  2 VIEW  COMPARISON:  01/03/2014 and 12/20/2013.  FINDINGS: 1923 hr. There are low lung volumes with patient rotation to the right. The left subclavian pacemaker leads appear unchanged within the right atrium and right ventricle. The heart size and mediastinal contours are stable. The lungs appear unchanged. I believe there is mild chronic scarring at the left base. There is no pleural effusion or pneumothorax. No acute osseous findings identified.  IMPRESSION: Stable radiographic appearance of the chest. No acute cardiopulmonary process.   Electronically Signed   By: Camie Patience  M.D.   On: 03/10/2014 19:31   Dg Chest Port 1 View  03/13/2014   CLINICAL DATA:  Right-sided PICC line placement.  EXAM: PORTABLE CHEST - 1 VIEW  COMPARISON:  March 10, 2014.  FINDINGS: The heart size and mediastinal contours are within normal limits. Both lungs are clear. No pneumothorax or pleural effusion is noted. Left-sided pacemaker is unchanged in position. Interval placement of right-sided PICC line with distal tip overlying expected position of SVC. The visualized skeletal structures are unremarkable.  IMPRESSION: Right-sided PICC line tip is seen in expected position of SVC. No acute cardiopulmonary abnormality seen.   Electronically Signed   By: Sabino Dick M.D.   On: 03/13/2014 13:13      Microbiology: Recent Results (from the past 240 hour(s))  Urine culture     Status: None   Collection Time: 03/10/14  6:40 PM  Result Value Ref Range Status   Specimen  Description URINE, CATHETERIZED  Final   Special Requests NONE  Final   Colony Count   Final    >=100,000 COLONIES/ML Performed at [facility]    Culture   Final    ESCHERICHIA COLI Note: Confirmed Extended Spectrum Beta-Lactamase Producer (ESBL) CRITICAL RESULT CALLED TO, READ BACK BY AND VERIFIED WITH: DANA_0 :00AM ON 03/13/14 BY DANTS Performed at [facility]    Report Status 03/13/2014 FINAL  Final   Organism ID, Bacteria ESCHERICHIA COLI  Final      Susceptibility   Escherichia coli - MIC*    AMPICILLIN >=32 RESISTANT Resistant     CEFAZOLIN >=64 RESISTANT Resistant     CEFTRIAXONE >=64 RESISTANT Resistant     CIPROFLOXACIN >=4 RESISTANT Resistant     GENTAMICIN <=1 SENSITIVE Sensitive     LEVOFLOXACIN >=8 RESISTANT Resistant     NITROFURANTOIN <=16 SENSITIVE Sensitive     TOBRAMYCIN <=1 SENSITIVE Sensitive     TRIMETH/SULFA <=20 SENSITIVE Sensitive     IMIPENEM 1 SENSITIVE Sensitive     PIP/TAZO <=4 SENSITIVE Sensitive     * ESCHERICHIA COLI  Urine culture     Status: None   Collection Time: 03/11/14  3:14 AM  Result Value Ref Range Status   Specimen Description URINE, RANDOM  Final   Special Requests NONE  Final   Colony Count   Final    3,000 COLONIES/ML Performed at [facility]    Culture   Final    INSIGNIFICANT GROWTH Performed at [facility]    Report Status 03/13/2014 FINAL  Final  Culture, blood (routine x 2)     Status: None   Collection Time: 03/11/14 10:04 AM  Result Value Ref Range Status   Specimen Description BLOOD RIGHT ANTECUBITAL  Final   Special Requests BOTTLES DRAWN AEROBIC AND ANAEROBIC 5 CC EACH  Final   Culture   Final    ESCHERICHIA COLI Note: Confirmed Extended Spectrum Beta-Lactamase Producer (ESBL) CRITICAL RESULT CALLED TO, READ BACK BY AND VERIFIED WITH: Sharyon Medicus 03/14/14 0750 BY SMITHERSJ Note: Gram Stain Report Called to,Read Back By and Verified With: CHLOE A. RN @ 1215AM 03/12/14  VINCJ Performed at [facility]    Report Status 03/14/2014 FINAL  Final   Organism ID, Bacteria ESCHERICHIA COLI  Final      Susceptibility   Escherichia coli - MIC*    AMPICILLIN >=32 RESISTANT Resistant     AMPICILLIN/SULBACTAM 16 INTERMEDIATE Intermediate     CEFAZOLIN >=64 RESISTANT Resistant     CEFEPIME >=64 RESISTANT Resistant  CEFTAZIDIME RESISTANT      CEFTRIAXONE >=64 RESISTANT Resistant     CIPROFLOXACIN >=4 RESISTANT Resistant     GENTAMICIN <=1 SENSITIVE Sensitive     IMIPENEM 1 SENSITIVE Sensitive     PIP/TAZO <=4 SENSITIVE Sensitive     TOBRAMYCIN <=1 SENSITIVE Sensitive     TRIMETH/SULFA <=20 SENSITIVE Sensitive     * ESCHERICHIA COLI  Culture, blood (routine x 2)     Status: None (Preliminary result)   Collection Time: 03/11/14 10:20 AM  Result Value Ref Range Status   Specimen Description BLOOD LEFT ANTECUBITAL  Final   Special Requests BOTTLES DRAWN AEROBIC AND ANAEROBIC 3 CC EACH  Final   Culture   Final           BLOOD CULTURE RECEIVED NO GROWTH TO DATE CULTURE WILL BE HELD FOR 5 DAYS BEFORE ISSUING A FINAL NEGATIVE REPORT Performed at [facility]    Report Status PENDING  Incomplete  MRSA PCR Screening     Status: Abnormal   Collection Time: 03/11/14  9:39 PM  Result Value Ref Range Status   MRSA by PCR POSITIVE (A) NEGATIVE Final    Comment:        The GeneXpert MRSA Assay (FDA approved for NASAL specimens only), is one component of a comprehensive MRSA colonization surveillance program. It is not intended to diagnose MRSA infection nor to guide or monitor treatment for MRSA infections. RESULT CALLED TO, READ BACK BY AND VERIFIED WITH: SPOKE WITH ADDISON,C RN [phone] [place],N      Labs: Results for orders placed or performed during the hospital encounter of 03/11/14 (from the past 48 hour(s))  Glucose, capillary     Status: Abnormal   Collection Time: 03/12/14  5:24 PM  Result Value Ref Range   Glucose-Capillary  136 (H) 70 - 99 mg/dL   Comment 1 Documented in Chart    Comment 2 Notify RN   Glucose, capillary     Status: Abnormal   Collection Time: 03/12/14  9:45 PM  Result Value Ref Range   Glucose-Capillary 138 (H) 70 - 99 mg/dL  Protime-INR     Status: Abnormal   Collection Time: 03/13/14  4:50 AM  Result Value Ref Range   Prothrombin Time 19.1 (H) 11.6 - 15.2 seconds   INR 1.58 (H) 0.00 - 1.49  CBC     Status: Abnormal   Collection Time: 03/13/14  4:50 AM  Result Value Ref Range   WBC 8.6 4.0 - 10.5 K/uL   RBC 4.29 4.22 - 5.81 MIL/uL   Hemoglobin 11.7 (L) 13.0 - 17.0 g/dL   HCT 36.9 (L) 39.0 - 52.0 %   MCV 86.0 78.0 - 100.0 fL   MCH 27.3 26.0 - 34.0 pg   MCHC 31.7 30.0 - 36.0 g/dL   RDW 16.4 (H) 11.5 - 15.5 %   Platelets 252 150 - 400 K/uL  Comprehensive metabolic panel     Status: Abnormal   Collection Time: 03/13/14  4:50 AM  Result Value Ref Range   Sodium 138 135 - 145 mmol/L   Potassium 3.9 3.5 - 5.1 mmol/L   Chloride 105 96 - 112 mmol/L   CO2 24 19 - 32 mmol/L   Glucose, Bld 104 (H) 70 - 99 mg/dL   BUN 25 (H) 6 - 23 mg/dL   Creatinine, Ser 1.54 (H) 0.50 - 1.35 mg/dL   Calcium 8.3 (L) 8.4 - 10.5 mg/dL   Total Protein 6.7 6.0 - 8.3 g/dL  Albumin 2.9 (L) 3.5 - 5.2 g/dL   AST 26 0 - 37 U/L   ALT 19 0 - 53 U/L   Alkaline Phosphatase 56 39 - 117 U/L   Total Bilirubin 0.6 0.3 - 1.2 mg/dL   GFR calc non Af Amer 39 (L) >90 mL/min   GFR calc Af Amer 45 (L) >90 mL/min    Comment: (NOTE) The eGFR has been calculated using the CKD EPI equation. This calculation has not been validated in all clinical situations. eGFR's persistently <90 mL/min signify possible Chronic Kidney Disease.    Anion gap 9 5 - 15  Glucose, capillary     Status: None   Collection Time: 03/13/14  7:35 AM  Result Value Ref Range   Glucose-Capillary 97 70 - 99 mg/dL  Glucose, capillary     Status: Abnormal   Collection Time: 03/13/14 11:50 AM  Result Value Ref Range   Glucose-Capillary 192 (H) 70 - 99  mg/dL  Glucose, capillary     Status: Abnormal   Collection Time: 03/13/14  4:52 PM  Result Value Ref Range   Glucose-Capillary 182 (H) 70 - 99 mg/dL  Glucose, capillary     Status: Abnormal   Collection Time: 03/13/14  9:09 PM  Result Value Ref Range   Glucose-Capillary 110 (H) 70 - 99 mg/dL  Protime-INR     Status: Abnormal   Collection Time: 03/14/14  6:00 AM  Result Value Ref Range   Prothrombin Time 19.1 (H) 11.6 - 15.2 seconds   INR 1.59 (H) 0.00 - 1.49  Glucose, capillary     Status: Abnormal   Collection Time: 03/14/14  7:32 AM  Result Value Ref Range   Glucose-Capillary 100 (H) 70 - 99 mg/dL     HPI :Randy [name] is a 81 y.o. male with PMHx of MI, HTN, DM, HLD presenting to the ED with AMS.Caregiver told EMS pt is more confused than normal today. Pt has been having increased frequency of urination and has had a strong odor to urine. Caregiver reports last time pt had similar symptoms he was diagnosed with a UTI.He was seen in the ED 1/23 , rx with rocephine and DC home on keflex which he was unable to fill . Today he presents from home and he was noted to have a fever. In the ED his fever was recorded as 102.7 . UA shows WBCs too numerous to count. Patient very weak and clinically dehydrated  HOSPITAL COURSE: Sepsis secondary to pyelonephritis/ESBL UTI, with gram-negative bacteremia Chest x-ray negative Patient placed on Primaxin given history of ESBL, will switch to ertapenem, PICC line placement Given positive blood cultures will need a total of 12 days of IV antibiotics, discussed with Dr. Megan Salon PICC line placement  Chronic kidney disease, stage III Creatinine at baseline Follow BMP In 5-7 days    Diabetes mellitus-controlled Hemoglobin A1c 6.9  Acute encephalopathy: Likely secondary to urinary tract infection and sepsis  Hx of MI: s/p stent placement per patient. On plavix at home. No chest pain on admission. - continue Plavix - Continue Lipitor Cont  coreg   Hx of R leg DVT: On Coumadin, INR subtherapeutic -Continue Coumadin per pharmacy Home health to follow the patients INR      Discharge Exam: *   Blood pressure 131/63, pulse 70, temperature 97.7 F (36.5 C), temperature source Oral, resp. rate 20, height _0  (1.702 m), weight 89.841 kg (198 lb 1 oz), SpO2 98 %.   General: Disoriented, does not appear  to be short of breath or any acute distress   Cardiovascular: RRR, nl S1 s2   Respiratory: Decreased breath sounds at the bases, scattered rhonchi, no crackles   Abdomen: soft +BS NT/ND, no masses palpable   Extremities: No cyanosis and no edema         Discharge Instructions    Diet - low sodium heart healthy    Complete by:  As directed      Increase activity slowly    Complete by:  As directed              Signed: March Steyer 03/14/2014, 12:35 PM

## 2014-03-14 NOTE — Progress Notes (Signed)
CRITICAL VALUE ALERT  Critical value received:  Anaerobic blood culture positive for E coli and ESBL  Date of notification:  03/14/2014   Time of notification:  7:50 AM  Critical value read back:Yes.    Nurse who received alert:  Sharyon Medicus RN  MD notified (1st page):  Dr. Allyson Sabal  Time of first page:  7:53 AM  MD notified (2nd page):  Time of second page:  Responding MD:  Dr. Allyson Sabal  Time MD responded:  7:58 AM

## 2014-03-17 LAB — CULTURE, BLOOD (ROUTINE X 2): Culture: NO GROWTH

## 2014-04-17 HISTORY — PX: PICC LINE PLACE PERIPHERAL (ARMC HX): HXRAD1248

## 2014-04-19 ENCOUNTER — Encounter (HOSPITAL_COMMUNITY): Payer: Self-pay | Admitting: *Deleted

## 2014-04-19 ENCOUNTER — Emergency Department (HOSPITAL_COMMUNITY): Payer: Medicare Other

## 2014-04-19 ENCOUNTER — Ambulatory Visit (HOSPITAL_COMMUNITY)
Admission: RE | Admit: 2014-04-19 | Discharge: 2014-04-19 | Disposition: A | Discharge status: Home / Self Care | Payer: Medicare Other | Source: Ambulatory Visit | Attending: Orthopedic Surgery | Admitting: Orthopedic Surgery

## 2014-04-19 ENCOUNTER — Inpatient Hospital Stay (HOSPITAL_COMMUNITY)
Admission: EM | Admit: 2014-04-19 | Discharge: 2014-04-24 | DRG: 464 | Disposition: A | Discharge status: Home / Self Care | Payer: Medicare Other | Attending: Internal Medicine | Admitting: Internal Medicine

## 2014-04-19 DIAGNOSIS — E119 Type 2 diabetes mellitus without complications: Secondary | ICD-10-CM | POA: Diagnosis present

## 2014-04-19 DIAGNOSIS — I878 Other specified disorders of veins: Secondary | ICD-10-CM | POA: Diagnosis present

## 2014-04-19 DIAGNOSIS — T8450XA Infection and inflammatory reaction due to unspecified internal joint prosthesis, initial encounter: Secondary | ICD-10-CM

## 2014-04-19 DIAGNOSIS — Z950 Presence of cardiac pacemaker: Secondary | ICD-10-CM

## 2014-04-19 DIAGNOSIS — Z794 Long term (current) use of insulin: Secondary | ICD-10-CM

## 2014-04-19 DIAGNOSIS — T8453XA Infection and inflammatory reaction due to internal right knee prosthesis, initial encounter: Secondary | ICD-10-CM | POA: Diagnosis present

## 2014-04-19 DIAGNOSIS — T8459XA Infection and inflammatory reaction due to other internal joint prosthesis, initial encounter: Secondary | ICD-10-CM

## 2014-04-19 DIAGNOSIS — N390 Urinary tract infection, site not specified: Secondary | ICD-10-CM | POA: Diagnosis present

## 2014-04-19 DIAGNOSIS — N400 Enlarged prostate without lower urinary tract symptoms: Secondary | ICD-10-CM | POA: Diagnosis present

## 2014-04-19 DIAGNOSIS — F419 Anxiety disorder, unspecified: Secondary | ICD-10-CM | POA: Diagnosis present

## 2014-04-19 DIAGNOSIS — Z86718 Personal history of other venous thrombosis and embolism: Secondary | ICD-10-CM

## 2014-04-19 DIAGNOSIS — Z96659 Presence of unspecified artificial knee joint: Secondary | ICD-10-CM

## 2014-04-19 DIAGNOSIS — K219 Gastro-esophageal reflux disease without esophagitis: Secondary | ICD-10-CM | POA: Diagnosis present

## 2014-04-19 DIAGNOSIS — D62 Acute posthemorrhagic anemia: Secondary | ICD-10-CM | POA: Diagnosis not present

## 2014-04-19 DIAGNOSIS — F0390 Unspecified dementia without behavioral disturbance: Secondary | ICD-10-CM | POA: Diagnosis not present

## 2014-04-19 DIAGNOSIS — I252 Old myocardial infarction: Secondary | ICD-10-CM | POA: Diagnosis not present

## 2014-04-19 DIAGNOSIS — M00861 Arthritis due to other bacteria, right knee: Secondary | ICD-10-CM | POA: Diagnosis not present

## 2014-04-19 DIAGNOSIS — Z7901 Long term (current) use of anticoagulants: Secondary | ICD-10-CM | POA: Diagnosis not present

## 2014-04-19 DIAGNOSIS — Z7902 Long term (current) use of antithrombotics/antiplatelets: Secondary | ICD-10-CM

## 2014-04-19 DIAGNOSIS — Z96651 Presence of right artificial knee joint: Secondary | ICD-10-CM | POA: Diagnosis present

## 2014-04-19 DIAGNOSIS — M009 Pyogenic arthritis, unspecified: Secondary | ICD-10-CM | POA: Diagnosis present

## 2014-04-19 DIAGNOSIS — I2510 Atherosclerotic heart disease of native coronary artery without angina pectoris: Secondary | ICD-10-CM | POA: Diagnosis present

## 2014-04-19 DIAGNOSIS — I10 Essential (primary) hypertension: Secondary | ICD-10-CM | POA: Diagnosis present

## 2014-04-19 DIAGNOSIS — Z955 Presence of coronary angioplasty implant and graft: Secondary | ICD-10-CM | POA: Diagnosis not present

## 2014-04-19 DIAGNOSIS — I129 Hypertensive chronic kidney disease with stage 1 through stage 4 chronic kidney disease, or unspecified chronic kidney disease: Secondary | ICD-10-CM | POA: Diagnosis present

## 2014-04-19 DIAGNOSIS — Z8619 Personal history of other infectious and parasitic diseases: Secondary | ICD-10-CM | POA: Insufficient documentation

## 2014-04-19 DIAGNOSIS — Z87891 Personal history of nicotine dependence: Secondary | ICD-10-CM | POA: Diagnosis not present

## 2014-04-19 DIAGNOSIS — N183 Chronic kidney disease, stage 3 unspecified: Secondary | ICD-10-CM | POA: Diagnosis present

## 2014-04-19 DIAGNOSIS — Z22322 Carrier or suspected carrier of Methicillin resistant Staphylococcus aureus: Secondary | ICD-10-CM | POA: Diagnosis not present

## 2014-04-19 DIAGNOSIS — Y838 Other surgical procedures as the cause of abnormal reaction of the patient, or of later complication, without mention of misadventure at the time of the procedure: Secondary | ICD-10-CM | POA: Diagnosis not present

## 2014-04-19 DIAGNOSIS — E785 Hyperlipidemia, unspecified: Secondary | ICD-10-CM | POA: Diagnosis present

## 2014-04-19 DIAGNOSIS — Y831 Surgical operation with implant of artificial internal device as the cause of abnormal reaction of the patient, or of later complication, without mention of misadventure at the time of the procedure: Secondary | ICD-10-CM | POA: Diagnosis present

## 2014-04-19 DIAGNOSIS — B9629 Other Escherichia coli [E. coli] as the cause of diseases classified elsewhere: Secondary | ICD-10-CM | POA: Diagnosis not present

## 2014-04-19 DIAGNOSIS — M25561 Pain in right knee: Secondary | ICD-10-CM | POA: Diagnosis present

## 2014-04-19 LAB — COMPREHENSIVE METABOLIC PANEL
ALBUMIN: 2.9 g/dL — AB (ref 3.5–5.2)
ALK PHOS: 69 U/L (ref 39–117)
ALT: 16 U/L (ref 0–53)
AST: 25 U/L (ref 0–37)
Anion gap: 7 (ref 5–15)
BILIRUBIN TOTAL: 0.4 mg/dL (ref 0.3–1.2)
BUN: 15 mg/dL (ref 6–23)
CHLORIDE: 103 mmol/L (ref 96–112)
CO2: 29 mmol/L (ref 19–32)
Calcium: 8.7 mg/dL (ref 8.4–10.5)
Creatinine, Ser: 1.57 mg/dL — ABNORMAL HIGH (ref 0.50–1.35)
GFR calc Af Amer: 44 mL/min — ABNORMAL LOW (ref >90)
GFR calc non Af Amer: 38 mL/min — ABNORMAL LOW (ref >90)
Glucose, Bld: 115 mg/dL — ABNORMAL HIGH (ref 70–99)
POTASSIUM: 4.1 mmol/L (ref 3.5–5.1)
Sodium: 139 mmol/L (ref 135–145)
Total Protein: 7.3 g/dL (ref 6.0–8.3)

## 2014-04-19 LAB — URINALYSIS, ROUTINE W REFLEX MICROSCOPIC
Bilirubin Urine: NEGATIVE
Glucose, UA: NEGATIVE mg/dL
KETONES UR: NEGATIVE mg/dL
NITRITE: NEGATIVE
PH: 5.5 (ref 5.0–8.0)
Protein, ur: 30 mg/dL — AB
SPECIFIC GRAVITY, URINE: 1.016 (ref 1.005–1.030)
Urobilinogen, UA: 0.2 mg/dL (ref 0.0–1.0)

## 2014-04-19 LAB — I-STAT CG4 LACTIC ACID, ED: Lactic Acid, Venous: 1.58 mmol/L (ref 0.5–2.0)

## 2014-04-19 LAB — CBC WITH DIFFERENTIAL/PLATELET
BASOS ABS: 0 10*3/uL (ref 0.0–0.1)
Basophils Relative: 0 % (ref 0–1)
EOS ABS: 0.3 10*3/uL (ref 0.0–0.7)
EOS PCT: 3 % (ref 0–5)
HCT: 40.7 % (ref 39.0–52.0)
Hemoglobin: 13.2 g/dL (ref 13.0–17.0)
Lymphocytes Relative: 17 % (ref 12–46)
Lymphs Abs: 1.6 10*3/uL (ref 0.7–4.0)
MCH: 27.2 pg (ref 26.0–34.0)
MCHC: 32.4 g/dL (ref 30.0–36.0)
MCV: 83.9 fL (ref 78.0–100.0)
Monocytes Absolute: 0.8 10*3/uL (ref 0.1–1.0)
Monocytes Relative: 9 % (ref 3–12)
Neutro Abs: 6.4 10*3/uL (ref 1.7–7.7)
Neutrophils Relative %: 71 % (ref 43–77)
PLATELETS: 490 10*3/uL — AB (ref 150–400)
RBC: 4.85 MIL/uL (ref 4.22–5.81)
RDW: 16.5 % — AB (ref 11.5–15.5)
WBC: 9.1 10*3/uL (ref 4.0–10.5)

## 2014-04-19 LAB — PROTIME-INR
INR: 2.52 — AB (ref 0.00–1.49)
PROTHROMBIN TIME: 27.4 s — AB (ref 11.6–15.2)

## 2014-04-19 LAB — URINE MICROSCOPIC-ADD ON

## 2014-04-19 LAB — GLUCOSE, CAPILLARY: Glucose-Capillary: 121 mg/dL — ABNORMAL HIGH (ref 70–99)

## 2014-04-19 MED ORDER — CARVEDILOL 3.125 MG PO TABS
3.1250 mg | ORAL_TABLET | Freq: Two times a day (BID) | ORAL | Status: DC
  Administered 2014-04-20 – 2014-04-24 (×8): 3.125 mg via ORAL
  Filled 2014-04-19 (×11): qty 1

## 2014-04-19 MED ORDER — HYDROCERIN EX CREA: 1.0000 "application " | TOPICAL_CREAM | CUTANEOUS | Status: DC | PRN

## 2014-04-19 MED ORDER — CETYLPYRIDINIUM CHLORIDE 0.05 % MT LIQD
7.0000 mL | Freq: Two times a day (BID) | OROMUCOSAL | Status: DC
  Administered 2014-04-20 – 2014-04-24 (×4): 7 mL via OROMUCOSAL

## 2014-04-19 MED ORDER — ENSURE COMPLETE PO LIQD
237.0000 mL | Freq: Two times a day (BID) | ORAL | Status: DC
  Administered 2014-04-21 – 2014-04-24 (×8): 237 mL via ORAL

## 2014-04-19 MED ORDER — CLOPIDOGREL BISULFATE 75 MG PO TABS
75.0000 mg | ORAL_TABLET | Freq: Every day | ORAL | Status: DC
  Administered 2014-04-21 – 2014-04-24 (×4): 75 mg via ORAL
  Filled 2014-04-19 (×4): qty 1

## 2014-04-19 MED ORDER — BUSPIRONE HCL 15 MG PO TABS
15.0000 mg | ORAL_TABLET | Freq: Two times a day (BID) | ORAL | Status: DC
  Administered 2014-04-20 – 2014-04-24 (×9): 15 mg via ORAL
  Filled 2014-04-19 (×13): qty 1

## 2014-04-19 MED ORDER — PIPERACILLIN-TAZOBACTAM 3.375 G IVPB
3.3750 g | Freq: Three times a day (TID) | INTRAVENOUS | Status: DC
  Administered 2014-04-20 – 2014-04-23 (×10): 3.375 g via INTRAVENOUS
  Filled 2014-04-19 (×12): qty 50

## 2014-04-19 MED ORDER — INSULIN ASPART 100 UNIT/ML ~~LOC~~ SOLN
0.0000 [IU] | Freq: Three times a day (TID) | SUBCUTANEOUS | Status: DC
  Administered 2014-04-21 (×3): 3 [IU] via SUBCUTANEOUS
  Administered 2014-04-22: 5 [IU] via SUBCUTANEOUS
  Administered 2014-04-22: 3 [IU] via SUBCUTANEOUS
  Administered 2014-04-22: 2 [IU] via SUBCUTANEOUS
  Administered 2014-04-23 (×2): 5 [IU] via SUBCUTANEOUS
  Administered 2014-04-24 (×2): 3 [IU] via SUBCUTANEOUS

## 2014-04-19 MED ORDER — PIPERACILLIN-TAZOBACTAM 3.375 G IVPB 30 MIN
3.3750 g | Freq: Once | INTRAVENOUS | Status: AC
  Administered 2014-04-19: 3.375 g via INTRAVENOUS
  Filled 2014-04-19: qty 50

## 2014-04-19 MED ORDER — TAMSULOSIN HCL 0.4 MG PO CAPS
0.4000 mg | ORAL_CAPSULE | Freq: Every day | ORAL | Status: DC
  Administered 2014-04-20 – 2014-04-24 (×5): 0.4 mg via ORAL
  Filled 2014-04-19 (×6): qty 1

## 2014-04-19 MED ORDER — VANCOMYCIN HCL IN DEXTROSE 1-5 GM/200ML-% IV SOLN
1000.0000 mg | Freq: Once | INTRAVENOUS | Status: AC
  Administered 2014-04-19: 1000 mg via INTRAVENOUS
  Filled 2014-04-19: qty 200

## 2014-04-19 MED ORDER — ATORVASTATIN CALCIUM 10 MG PO TABS
5.0000 mg | ORAL_TABLET | Freq: Every day | ORAL | Status: DC
  Administered 2014-04-20 – 2014-04-24 (×5): 5 mg via ORAL
  Filled 2014-04-19 (×5): qty 1

## 2014-04-19 MED ORDER — ZINC OXIDE 40 % EX OINT: TOPICAL_OINTMENT | CUTANEOUS | Status: DC | PRN

## 2014-04-19 MED ORDER — FAMOTIDINE 20 MG PO TABS
20.0000 mg | ORAL_TABLET | Freq: Two times a day (BID) | ORAL | Status: DC
  Administered 2014-04-20 – 2014-04-24 (×9): 20 mg via ORAL
  Filled 2014-04-19 (×10): qty 1

## 2014-04-19 NOTE — ED Provider Notes (Signed)
CSN: [id number]     Arrival date & time 04/19/14  1315 History   First MD Initiated Contact with Patient 04/19/14 1451     Chief Complaint  Patient presents with  . Knee Pain     (Consider location/radiation/quality/duration/timing/severity/associated sxs/prior Treatment) HPI Comments: Patient from orthopedics office with 2 day history of right knee pain and swelling and redness. He had joint aspiration the office that was reportedly septic. Patient sent for admission for our washout. He denies fevers, chills, nausea or vomiting. Denies any abdominal pain, chest pain or shortness of breath. Previous admission last month for urinary tract infection. Patient is on Coumadin for history of DVT. He also takes Plavix. He denies any pain in his knee unless he tries to bear weight.  The history is provided by the patient and a caregiver.    Past Medical History  Diagnosis Date  . Diabetes mellitus without complication   . Anxiety   . MI (myocardial infarction)   . Hypertension   . Hyperlipidemia    Past Surgical History  Procedure Laterality Date  . Knee surgery    . Cholecystectomy    . Hemorroidectomy    . Pacemaker insertion     Family History  Problem Relation Age of Onset  . Heart attack Mother   . Heart attack Father    History  Substance Use Topics  . Smoking status: Former Research scientist (life sciences)  . Smokeless tobacco: Never Used  . Alcohol Use: No    Review of Systems  Constitutional: Positive for activity change and appetite change. Negative for fever.  HENT: Negative for congestion and rhinorrhea.   Respiratory: Negative for cough, chest tightness and shortness of breath.   Cardiovascular: Negative for chest pain.  Gastrointestinal: Negative for nausea, vomiting and abdominal pain.  Genitourinary: Negative for dysuria and hematuria.  Musculoskeletal: Positive for myalgias, joint swelling and arthralgias.  Skin: Positive for wound.  Neurological: Negative for dizziness, weakness and  headaches.  A complete 10 system review of systems was obtained and all systems are negative except as noted in the HPI and PMH.      Allergies  Review of patient's allergies indicates no known allergies.  Home Medications   Prior to Admission medications   Medication Sig Start Date End Date Taking? Authorizing Provider  warfarin (COUMADIN) 5 MG tablet Take 5 mg by mouth daily.   Yes Historical Provider, MD  antiseptic oral rinse (CPC / CETYLPYRIDINIUM CHLORIDE 0.05%) 0.05 % LIQD solution 7 mLs by Mouth Rinse route 2 (two) times daily. Patient not taking: Reported on 03/11/2014 12/22/13   Robbie Lis, MD  atorvastatin (LIPITOR) 10 MG tablet Take 5 mg by mouth daily.     Historical Provider, MD  busPIRone (BUSPAR) 15 MG tablet Take 1 tablet (15 mg total) by mouth 2 (two) times daily. 01/08/14   Shanker Kristeen Mans, MD  carvedilol (COREG) 3.125 MG tablet Take 3.125 mg by mouth 2 (two) times daily with a meal.    Historical Provider, MD  clopidogrel (PLAVIX) 75 MG tablet Take 75 mg by mouth daily.    Historical Provider, MD  Diaper Rash Products (DESITIN EX) Apply 1 application topically as needed.    Historical Provider, MD  feeding supplement, ENSURE COMPLETE, (ENSURE COMPLETE) LIQD Take 237 mLs by mouth 2 (two) times daily between meals. 12/22/13   Robbie Lis, MD  insulin NPH Human (HUMULIN N,NOVOLIN N) 100 UNIT/ML injection Inject 16 Units into the skin 2 (two) times daily before a  meal.     Historical Provider, MD  ranitidine (ZANTAC) 150 MG capsule Take 150 mg by mouth 2 (two) times daily.    Historical Provider, MD  Skin Protectants, Misc. (EUCERIN) cream Apply 1 application topically as needed for dry skin.    Historical Provider, MD  tamsulosin (FLOMAX) 0.4 MG CAPS capsule Take 1 capsule (0.4 mg total) by mouth daily. 12/22/13   Robbie Lis, MD  warfarin (COUMADIN) 7.5 MG tablet Take 1 tablet (7.5 mg total) by mouth one time only at 6 PM. Patient not taking: Reported on 04/19/2014  01/10/14   Shanker Kristeen Mans, MD   BP 127/42 mmHg  Pulse 70  Temp(Src) 98.3 F (36.8 C) (Oral)  Resp 18  Wt 197 lb 15.6 oz (89.8 kg)  SpO2 100% Physical Exam  Constitutional: He is oriented to person, place, and time. He appears well-developed and well-nourished. No distress.  HENT:  Head: Normocephalic and atraumatic.  Mouth/Throat: Oropharynx is clear and moist. No oropharyngeal exudate.  Eyes: Conjunctivae and EOM are normal. Pupils are equal, round, and reactive to light.  Neck: Normal range of motion. Neck supple.  No meningismus.  Cardiovascular: Normal rate, regular rhythm, normal heart sounds and intact distal pulses.   No murmur heard. Pulmonary/Chest: Effort normal and breath sounds normal. No respiratory distress.  Abdominal: Soft. There is no tenderness. There is no rebound and no guarding.  Musculoskeletal: Normal range of motion. He exhibits edema and tenderness.  Right knee with erythema and edema. Reduced range of motion. Intact distal pulses.  Neurological: He is alert and oriented to person, place, and time. No cranial nerve deficit. He exhibits normal muscle tone. Coordination normal.  No ataxia on finger to nose bilaterally. No pronator drift. 5/5 strength throughout. CN 2-12 intact. Negative Romberg. Equal grip strength. Sensation intact. Gait is normal.   Skin: Skin is warm.  Psychiatric: He has a normal mood and affect. His behavior is normal.  Nursing note and vitals reviewed.   ED Course  Procedures (including critical care time) Labs Review Labs Reviewed  CBC WITH DIFFERENTIAL/PLATELET - Abnormal; Notable for the following:    RDW 16.5 (*)    Platelets 490 (*)    All other components within normal limits  PROTIME-INR - Abnormal; Notable for the following:    Prothrombin Time 27.4 (*)    INR 2.52 (*)    All other components within normal limits  URINALYSIS, ROUTINE W REFLEX MICROSCOPIC - Abnormal; Notable for the following:    APPearance TURBID (*)     Hgb urine dipstick MODERATE (*)    Protein, ur 30 (*)    Leukocytes, UA LARGE (*)    All other components within normal limits  URINE MICROSCOPIC-ADD ON - Abnormal; Notable for the following:    Squamous Epithelial / LPF FEW (*)    Bacteria, UA MANY (*)    All other components within normal limits  COMPREHENSIVE METABOLIC PANEL - Abnormal; Notable for the following:    Glucose, Bld 115 (*)    Creatinine, Ser 1.57 (*)    Albumin 2.9 (*)    GFR calc non Af Amer 38 (*)    GFR calc Af Amer 44 (*)    All other components within normal limits  GLUCOSE, CAPILLARY - Abnormal; Notable for the following:    Glucose-Capillary 121 (*)    All other components within normal limits  CULTURE, BLOOD (ROUTINE X 2)  CULTURE, BLOOD (ROUTINE X 2)  MRSA PCR SCREENING  URINE  CULTURE  MAGNESIUM  PHOSPHORUS  BASIC METABOLIC PANEL  PROTIME-INR  CBC  I-STAT CG4 LACTIC ACID, ED  I-STAT CG4 LACTIC ACID, ED    Imaging Review Dg Chest 2 View  04/19/2014   CLINICAL DATA:  Hypertension, diabetes, past smoker right knee pain  EXAM: CHEST  2 VIEW  COMPARISON:  03/13/2014  FINDINGS: Cardiomegaly again noted. Chronic elevation of the right hemidiaphragm. No acute infiltrate or pleural effusion. No pulmonary edema. Dual lead cardiac pacemaker with leads in right atrium and right ventricle. Osteopenia and mild degenerative changes thoracic spine.  IMPRESSION: No active disease.  Dual lead cardiac pacemaker in place.   Electronically Signed   By: Lahoma Crocker M.D.   On: 04/19/2014 16:10   Dg Knee Complete 4 Views Right  04/19/2014   CLINICAL DATA:  Knee pain, status post knee replacement 2010  EXAM: RIGHT KNEE - COMPLETE 4+ VIEW  COMPARISON:  None.  FINDINGS: Four views of the right knee submitted. No acute fracture or subluxation. There is right knee prosthesis in anatomic alignment. Spurring of patella. Small joint effusion. There is no evidence of loosening of prosthesis.  IMPRESSION: No acute fracture or subluxation.  Right knee prosthesis in anatomic alignment. No evidence of loosening of prosthesis. Spurring of patella. Small joint effusion.   Electronically Signed   By: Lahoma Crocker M.D.   On: 04/19/2014 16:05     EKG Interpretation None      MDM   Final diagnoses:  Septic arthritis of knee, right   Patient presents from ortho office with concern for septic knee. No fever or systemic symptoms.  Discussed with Dr. Mayer Camel confirms the affected joint and recommended medical admission for antibiotics. Plan for OR tomorrow.  Previous urine culture grew E coli ESBL.   Vitals  Stable. No leukocytosis. No fever. +UTI as well.   We'll treat with Zosyn which patient's last blood culture was sensitive to.  Admission discussed with outpatient clinic residents.  Ezequiel Essex, MD 04/19/14 [phone]

## 2014-04-19 NOTE — Consult Note (Signed)
Reason for Consult: Infected right total knee  Referring Physician: Dr. Sherilyn Cooter is an 81 y.o. male.   HPI:  Randy [name] is here today with complaint of right knee pain.  Patient states that over the few days  he's noticed increased pain and some swelling as well as redness on the right knee.  He does have a history of a right total knee arthroplasty performed by Dr. Stana Bunting and states that on 2010.  The patient has had a very stiff knee following his knee replacement most likely due to noncompliance with physical therapy.  Again a proximally 1 week ago he noticed increased pain and swelling on the knee.  He cannot recall a specific injury.  He denies any fevers chills night sweats or other signs of infection.  Today, he rates his pain as an 8/10 with any movement but states it is minimal at rest.  The patient is here today with his caretaker, she reports that he was hospitalized from 1/24-1/27 2016, with urosepsis and grew Escherichia coli out of both the urine and his blood.  His PICC line was discontinued 4-5 days ago.  In the last day or 2, he has  grown somewhat weaker, with some crackles when he breathes.  Past Medical History  Diagnosis Date  . Diabetes mellitus without complication   . Anxiety   . MI (myocardial infarction)   . Hypertension   . Hyperlipidemia     Past Surgical History  Procedure Laterality Date  . Knee surgery    . Cholecystectomy    . Hemorroidectomy    . Pacemaker insertion      Family History  Problem Relation Age of Onset  . Heart attack Mother   . Heart attack Father     Social History:  reports that he has quit smoking. He has never used smokeless tobacco. He reports that he does not drink alcohol or use illicit drugs.  Allergies: No Known Allergies  Medications: I have reviewed the patient's current medications.  Results for orders placed or performed during the hospital encounter of 04/19/14 (from the past 48 hour(s))  CBC with  Differential/Platelet     Status: Abnormal   Collection Time: 04/19/14  3:30 PM  Result Value Ref Range   WBC 9.1 4.0 - 10.5 K/uL   RBC 4.85 4.22 - 5.81 MIL/uL   Hemoglobin 13.2 13.0 - 17.0 g/dL   HCT 40.7 39.0 - 52.0 %   MCV 83.9 78.0 - 100.0 fL   MCH 27.2 26.0 - 34.0 pg   MCHC 32.4 30.0 - 36.0 g/dL   RDW 16.5 (H) 11.5 - 15.5 %   Platelets 490 (H) 150 - 400 K/uL   Neutrophils Relative % 71 43 - 77 %   Neutro Abs 6.4 1.7 - 7.7 K/uL   Lymphocytes Relative 17 12 - 46 %   Lymphs Abs 1.6 0.7 - 4.0 K/uL   Monocytes Relative 9 3 - 12 %   Monocytes Absolute 0.8 0.1 - 1.0 K/uL   Eosinophils Relative 3 0 - 5 %   Eosinophils Absolute 0.3 0.0 - 0.7 K/uL   Basophils Relative 0 0 - 1 %   Basophils Absolute 0.0 0.0 - 0.1 K/uL  Urinalysis, Routine w reflex microscopic     Status: Abnormal   Collection Time: 04/19/14  3:30 PM  Result Value Ref Range   Color, Urine YELLOW YELLOW   APPearance TURBID (A) CLEAR   Specific Gravity, Urine 1.016 1.005 -  1.030   pH 5.5 5.0 - 8.0   Glucose, UA NEGATIVE NEGATIVE mg/dL   Hgb urine dipstick MODERATE (A) NEGATIVE   Bilirubin Urine NEGATIVE NEGATIVE   Ketones, ur NEGATIVE NEGATIVE mg/dL   Protein, ur 30 (A) NEGATIVE mg/dL   Urobilinogen, UA 0.2 0.0 - 1.0 mg/dL   Nitrite NEGATIVE NEGATIVE   Leukocytes, UA LARGE (A) NEGATIVE  Urine microscopic-add on     Status: Abnormal   Collection Time: 04/19/14  3:30 PM  Result Value Ref Range   Squamous Epithelial / LPF FEW (A) RARE   WBC, UA TOO NUMEROUS TO COUNT <3 WBC/hpf   RBC / HPF 11-20 <3 RBC/hpf   Bacteria, UA MANY (A) RARE    Dg Chest 2 View  04/19/2014   CLINICAL DATA:  Hypertension, diabetes, past smoker right knee pain  EXAM: CHEST  2 VIEW  COMPARISON:  03/13/2014  FINDINGS: Cardiomegaly again noted. Chronic elevation of the right hemidiaphragm. No acute infiltrate or pleural effusion. No pulmonary edema. Dual lead cardiac pacemaker with leads in right atrium and right ventricle. Osteopenia and mild  degenerative changes thoracic spine.  IMPRESSION: No active disease.  Dual lead cardiac pacemaker in place.   Electronically Signed   By: Lahoma Crocker M.D.   On: 04/19/2014 16:10   Dg Knee Complete 4 Views Right  04/19/2014   CLINICAL DATA:  Knee pain, status post knee replacement 2010  EXAM: RIGHT KNEE - COMPLETE 4+ VIEW  COMPARISON:  None.  FINDINGS: Four views of the right knee submitted. No acute fracture or subluxation. There is right knee prosthesis in anatomic alignment. Spurring of patella. Small joint effusion. There is no evidence of loosening of prosthesis.  IMPRESSION: No acute fracture or subluxation. Right knee prosthesis in anatomic alignment. No evidence of loosening of prosthesis. Spurring of patella. Small joint effusion.   Electronically Signed   By: Lahoma Crocker M.D.   On: 04/19/2014 16:05    ROS  Patient denies any chest pains or shortness of breath.  Blood pressure 138/51, pulse 70, temperature 97.6 F (36.4 C), temperature source Oral, resp. rate 18, weight 89.8 kg (197 lb 15.6 oz), SpO2 99 %.   Physical Exam   Blood pressure 113/64, temperature 98.7, pulse 69.  Well-nourished well-developed patient seated on the exam table in no obvious distress no shortness of breath.  Patient is a pleasant 81 year old male in no acute distress.  He is alert and oriented x3.  His skin is intact.  Patient's respirations are regular and slightly labored.  Today, the patient has some erythema or warmth as well as redness over the superior lateral portion of his knee.  He does have an area of fluctuance.  He continues to have a range from 30-45 with an obvious flexion contracture.  No noticeable laxity.  His calves are soft and nontender.  He does have chronic venous stasis and skin changes over bilateral lower extremities.  RADIOGRAPHS:  X-rays were ordered, performed, and interpreted by myself and Dr. Mayer Camel today included; AP and lateral views of the right knee are taken and reviewed in office  today.  No fracture dislocation subluxation or tumors identified.  After discussing options and obtaining informed consent, the area of fluctuance was cleansed with alcohol, anesthetized with ethyl chloride and we aspirated 15 cc of cloudy oranges reddish fluid.  He has the patient also had an effusion, we went to the other side of the total knee where was noninflamed likewise prepped the skin and aspirated  another 35 cc of similar purulent fluid.  This was sent off for stat Gram stain, culture and cell count.   Assessment/Plan:  Assess: Patient with urosepsis in late January 2016, blood cultures grew out Escherichia coli, just completed PICC line last week,.  He developed effusion and pain and right total knee with weakness, aspirated almost 40 cc of pus from total knee today.   Plan: Treatment options are discussed with the patient and Dr. Mayer Camel.  At this time the patient will be sent to [facility] to be admitted by [facility]  hospitalist, spoke with Dr. Tana Coast.  Unfortunately, there are no beds available and the patient will have to be admitted through the emergency room.  After preoperative evaluation and clearance by the hospitalist, the plan is to have the patient undergo a irrigation and debridement with removal of parts and placement of an antibiotic spacer.  He'll likely be on IV antibiotics following the procedure and he will have a consult with infectious disease.  Because of his age, diabetes and cardiac history this patient is at a high risk for complications.  Patient is told there are certainly risks with a procedure of this magnitude.  The potential complications of surgery are discussed with the patient.  Surgeries plan for 04/20/14 at 1240 to include irrigation and debridement as well as removal of an infected total knee and placement of antibiotic spacer.  PHILLIPS, ERIC R 04/19/2014, 5:19 PM

## 2014-04-19 NOTE — ED Notes (Signed)
Pt in from [facility] to be admitted to the hospital, pt is planned to have irrigation and debridement of his right knee. Pt had this knee replaced in 2010 and due to noncompliance with physical therapy pt has developed pain and stiffness in this knee. Pt sent here due to no available beds in the hospital.

## 2014-04-19 NOTE — Consult Note (Signed)
ANTIBIOTIC CONSULT NOTE - INITIAL  Pharmacy Consult for Zosyn Indication: right knee infection, UTI  No Known Allergies  Patient Measurements: Weight: 197 lb 15.6 oz (89.8 kg)  Vital Signs: Temp: 97.6 F (36.4 C) (03/03 1321) Temp Source: Oral (03/03 1321) BP: 128/65 mmHg (03/03 1800) Pulse Rate: 70 (03/03 1800) Intake/Output from previous day:   Intake/Output from this shift:    Labs:  Recent Labs  04/19/14 1530 04/19/14 1652  WBC 9.1  --   HGB 13.2  --   PLT 490*  --   CREATININE  --  1.57*   Estimated Creatinine Clearance: 35.4 mL/min (by C-G formula based on Cr of 1.57).  Microbiology: No results found for this or any previous visit (from the past 720 hour(s)).  Medical History: Past Medical History  Diagnosis Date  . Diabetes mellitus without complication   . Anxiety   . MI (myocardial infarction)   . Hypertension   . Hyperlipidemia    Assessment: 89yom s/p right TKR in 2010 presents to the ED with increased pain, swelling, and redness to his right knee. He was recently hospitalized 1/24-1/27 with Ecoli urosepsis/bacteremia and finished a course of ertapenem ~ 5 days ago. He will now begin zosyn with plans for I&D of his knee tomorrow. Serum creatinine is elevated at 1.57 but appears to be around his baseline.   Received vancomycin 1g and zosyn 3.375g in the ED.  3/3 blood cx x2>> 3/3 UA positive  Goal of Therapy:  Appropriate dosing  Plan:  1) Zosyn 3.375g IV q8 ( 4 hours infusion) 2) Follow renal function, cultures, LOT  Deboraha Sprang 04/19/2014,6:05 PM

## 2014-04-19 NOTE — ED Notes (Signed)
Patient transported to X-ray 

## 2014-04-19 NOTE — H&P (Signed)
Date: 04/19/2014               Patient Name:  Randy [name] MRN: [id number]  DOB: 07/23/1924 Age / Sex: 81 y.o., male   PCP: Pcp Not In System         Medical Service: Internal Medicine Teaching Service         Attending Physician: Dr. Aldine Contes, MD    First Contact: Dr. Venita Lick  Pager: [phone]  Second Contact: Dr. Bing Neighbors  Pager: [phone]       After Hours (After 5p/  First Contact Pager: [phone]  weekends / holidays): Second Contact Pager: [phone]   Chief Complaint: Right knee pain  History of Present Illness: Mr. Randy [name] is a 81 year old male with type 2 diabetes, history of MI status post stent, hypertension, hyperlipidemia who presents today with right knee pain. His caregiver Randy [name] was present and contributed to the interview.  Today, he went to see his orthopedist Dr. Mayer Camel for right knee pain. He reports that there was increased warmth, erythema, swelling and wanted to have it evaluated. He reports he has had knee issues for the past 5-6 years did not feel as though the pain was worse. Per orthopedics, he had a right total knee arthroplasty performed by Dr. Stana Bunting in 2010. His caregiver however reported that he had decreased mobility using his walker at home which raise suspicion.  He was hospitalized from 03/11/14 through 03/14/14 for Escherichia coli urosepsis secondary to pyelonephritis for which he was given imipenem/cilastatin due to history of ESBL infections but was transitioned to ertapenem for total 12 day course of antibiotics. His PICC line was discontinued for 4 to 5 days ago which the caregiver confirms. Currently, he denies any fever, chills, dysuria, flank pain, hematuria, increased urinary frequency, abdominal pain, nausea, vomiting.  In the office, he was found to have an effusion of his right knee, and 40 mL of purulent fluid was collected and sent for culture, Gram stain, cell count.   Meds: Current Facility-Administered Medications    Medication Dose Route Frequency Provider Last Rate Last Dose  . [START ON 04/20/2014] piperacillin-tazobactam (ZOSYN) IVPB 3.375 g  3.375 g Intravenous 3 times per day Deboraha Sprang, [facility]        Allergies: Allergies as of 04/19/2014  . (No Known Allergies)   Past Medical History  Diagnosis Date  . Diabetes mellitus without complication   . Anxiety   . MI (myocardial infarction)   . Hypertension   . Hyperlipidemia    Past Surgical History  Procedure Laterality Date  . Knee surgery    . Cholecystectomy    . Hemorroidectomy    . Pacemaker insertion     Family History  Problem Relation Age of Onset  . Heart attack Mother   . Heart attack Father    History   Social History  . Marital Status: Widowed    Spouse Name: N/A  . Number of Children: N/A  . Years of Education: N/A   Occupational History  . Not on file.   Social History Main Topics  . Smoking status: Former Research scientist (life sciences)  . Smokeless tobacco: Never Used  . Alcohol Use: No  . Drug Use: No  . Sexual Activity: No   Other Topics Concern  . Not on file   Social History Narrative    Review of Systems: Review of Systems  Respiratory: Negative for shortness of breath.   Cardiovascular: Negative for chest pain.  Physical Exam: Blood pressure 137/59, pulse 76, temperature 97.6 F (36.4 C), temperature source Oral, resp. rate 18, weight 197 lb 15.6 oz (89.8 kg), SpO2 96 %.   General: resting in bed, NAD HEENT: Left eye pupil fixated and dilated without any response to light, right pupillary response to light intact, EOMI, no scleral icterus, metal fillings noted in his oropharynx Cardiac: RRR, no rubs, murmurs or gallops Pulm: clear to auscultation bilaterally, no wheezes, rales, or rhonchi Abd: soft, nontender, nondistended, BS present Ext: Right knee with increased erythema, swelling, warmth as compared to the left knee. Right lower extremity also with skin discoloration that appears consistent with  chronic venous stasis disease.        Neuro: responds to questions appropriately; moving all extremities freely   Lab results: Basic Metabolic Panel:  Recent Labs  04/19/14 1652  NA 139  K 4.1  CL 103  CO2 29  GLUCOSE 115*  BUN 15  CREATININE 1.57*  CALCIUM 8.7   Liver Function Tests:  Recent Labs  04/19/14 1652  AST 25  ALT 16  ALKPHOS 69  BILITOT 0.4  PROT 7.3  ALBUMIN 2.9*  CBC:  Recent Labs  04/19/14 1530  WBC 9.1  NEUTROABS 6.4  HGB 13.2  HCT 40.7  MCV 83.9  PLT 490*   Coagulation:  Recent Labs  04/19/14 1652  LABPROT 27.4*  INR 2.52*   Urinalysis:  Recent Labs  04/19/14 1530  COLORURINE YELLOW  LABSPEC 1.016  PHURINE 5.5  GLUCOSEU NEGATIVE  HGBUR MODERATE*  BILIRUBINUR NEGATIVE  KETONESUR NEGATIVE  PROTEINUR 30*  UROBILINOGEN 0.2  NITRITE NEGATIVE  LEUKOCYTESUR LARGE*    Imaging results:  Dg Chest 2 View  04/19/2014   CLINICAL DATA:  Hypertension, diabetes, past smoker right knee pain  EXAM: CHEST  2 VIEW  COMPARISON:  03/13/2014  FINDINGS: Cardiomegaly again noted. Chronic elevation of the right hemidiaphragm. No acute infiltrate or pleural effusion. No pulmonary edema. Dual lead cardiac pacemaker with leads in right atrium and right ventricle. Osteopenia and mild degenerative changes thoracic spine.  IMPRESSION: No active disease.  Dual lead cardiac pacemaker in place.   Electronically Signed   By: Lahoma Crocker M.D.   On: 04/19/2014 16:10   Dg Knee Complete 4 Views Right  04/19/2014   CLINICAL DATA:  Knee pain, status post knee replacement 2010  EXAM: RIGHT KNEE - COMPLETE 4+ VIEW  COMPARISON:  None.  FINDINGS: Four views of the right knee submitted. No acute fracture or subluxation. There is right knee prosthesis in anatomic alignment. Spurring of patella. Small joint effusion. There is no evidence of loosening of prosthesis.  IMPRESSION: No acute fracture or subluxation. Right knee prosthesis in anatomic alignment. No evidence of  loosening of prosthesis. Spurring of patella. Small joint effusion.   Electronically Signed   By: Lahoma Crocker M.D.   On: 04/19/2014 16:05    Assessment & Plan by Problem:  Right knee septic arthritis: Likely in the setting of his most recent sepsis though he had been treated with IV antibiotics. Of course, any kind of hardware in place can serve as a nidus for infection. UA does appear suggestive of infection given large leukocytes, many bacteria though patient is currently without symptoms. No reported use of catheter at home which could account for these findings. Plan for operative irrigation and debridement, removal of infected total knee, and placement of an antibiotic spacer. -Continue Zosyn to cover for Escherichia coli, etiologic agent of his urosepsis -Follow-up cell  count, culture, Gram stain from knee effusion -Orthopedics following, appreciate recommendations -Check urine culture to guide antibiotic therapy -Consult PT/OT  Chronic anticoagulation: Currently taking warfarin 5 mg daily, most recent doses morning. 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 INR 2.5 which is therapeutic. -Hold warfarin for operative management as noted above  CKD Stage 3: Crt 1.6, baseline 1.5-1.7.  -Continue following  BPH: Continue Flomax 0.4 mg  Hyperlipidemia: Continue atorvastatin 5 mg  Hypertension: Continue Coreg 3.125 mg twice a day.  GERD: Continue Pepcid 20 mg twice a day.  Anxiety: Continue BuSpar 15 mg twice a day.  Coronary artery disease: Continue Plavix 75 mg.  #FEN:  -Diet: Nothing by mouth after midnight  #DVT prophylaxis: SCDs  #CODE STATUS: FULL CODE -Defer to caregivers Teonte [phone]] and Dyann Ruddle [[phone]] if patient lacks decision-making capacity -Confirmed with patient on admission  Dispo: Disposition is deferred at this time, awaiting improvement of current medical problems.   The patient does have a current PCP (Pcp Not In System) and does not need an  [facility] hospital follow-up appointment after discharge.  The patient does have transportation limitations that hinder transportation to clinic appointments.  Signed: Charlott Rakes, MD 04/19/2014, 8:01 PM

## 2014-04-20 ENCOUNTER — Inpatient Hospital Stay (HOSPITAL_COMMUNITY): Payer: Medicare Other | Admitting: Anesthesiology

## 2014-04-20 ENCOUNTER — Encounter (HOSPITAL_COMMUNITY)
Admission: EM | Disposition: A | Discharge status: Home / Self Care | Payer: Self-pay | Source: Home / Self Care | Attending: Internal Medicine

## 2014-04-20 ENCOUNTER — Inpatient Hospital Stay: Admit: 2014-04-20 | Payer: Self-pay | Admitting: Orthopedic Surgery

## 2014-04-20 ENCOUNTER — Inpatient Hospital Stay (HOSPITAL_COMMUNITY): Payer: Medicare Other

## 2014-04-20 DIAGNOSIS — E119 Type 2 diabetes mellitus without complications: Secondary | ICD-10-CM

## 2014-04-20 DIAGNOSIS — N183 Chronic kidney disease, stage 3 unspecified: Secondary | ICD-10-CM | POA: Diagnosis present

## 2014-04-20 DIAGNOSIS — T8450XA Infection and inflammatory reaction due to unspecified internal joint prosthesis, initial encounter: Secondary | ICD-10-CM

## 2014-04-20 DIAGNOSIS — N400 Enlarged prostate without lower urinary tract symptoms: Secondary | ICD-10-CM

## 2014-04-20 DIAGNOSIS — M00861 Arthritis due to other bacteria, right knee: Secondary | ICD-10-CM

## 2014-04-20 DIAGNOSIS — F419 Anxiety disorder, unspecified: Secondary | ICD-10-CM

## 2014-04-20 DIAGNOSIS — K219 Gastro-esophageal reflux disease without esophagitis: Secondary | ICD-10-CM

## 2014-04-20 DIAGNOSIS — I129 Hypertensive chronic kidney disease with stage 1 through stage 4 chronic kidney disease, or unspecified chronic kidney disease: Secondary | ICD-10-CM

## 2014-04-20 DIAGNOSIS — E785 Hyperlipidemia, unspecified: Secondary | ICD-10-CM

## 2014-04-20 DIAGNOSIS — I2510 Atherosclerotic heart disease of native coronary artery without angina pectoris: Secondary | ICD-10-CM

## 2014-04-20 HISTORY — PX: EXCISIONAL TOTAL KNEE ARTHROPLASTY WITH ANTIBIOTIC SPACERS: SHX5827

## 2014-04-20 LAB — GLUCOSE, CAPILLARY
Glucose-Capillary: 119 mg/dL — ABNORMAL HIGH (ref 70–99)
Glucose-Capillary: 130 mg/dL — ABNORMAL HIGH (ref 70–99)
Glucose-Capillary: 172 mg/dL — ABNORMAL HIGH (ref 70–99)
Glucose-Capillary: 187 mg/dL — ABNORMAL HIGH (ref 70–99)

## 2014-04-20 LAB — CBC
HCT: 36.9 % — ABNORMAL LOW (ref 39.0–52.0)
Hemoglobin: 11.7 g/dL — ABNORMAL LOW (ref 13.0–17.0)
MCH: 26.6 pg (ref 26.0–34.0)
MCHC: 31.7 g/dL (ref 30.0–36.0)
MCV: 83.9 fL (ref 78.0–100.0)
PLATELETS: 464 10*3/uL — AB (ref 150–400)
RBC: 4.4 MIL/uL (ref 4.22–5.81)
RDW: 16.4 % — ABNORMAL HIGH (ref 11.5–15.5)
WBC: 7.5 10*3/uL (ref 4.0–10.5)

## 2014-04-20 LAB — PHOSPHORUS: Phosphorus: 3.1 mg/dL (ref 2.3–4.6)

## 2014-04-20 LAB — BASIC METABOLIC PANEL
Anion gap: 10 (ref 5–15)
BUN: 14 mg/dL (ref 6–23)
CHLORIDE: 106 mmol/L (ref 96–112)
CO2: 23 mmol/L (ref 19–32)
CREATININE: 1.48 mg/dL — AB (ref 0.50–1.35)
Calcium: 8.7 mg/dL (ref 8.4–10.5)
GFR calc non Af Amer: 41 mL/min — ABNORMAL LOW (ref >90)
GFR, EST AFRICAN AMERICAN: 47 mL/min — AB (ref >90)
Glucose, Bld: 118 mg/dL — ABNORMAL HIGH (ref 70–99)
Potassium: 4.1 mmol/L (ref 3.5–5.1)
Sodium: 139 mmol/L (ref 135–145)

## 2014-04-20 LAB — MRSA PCR SCREENING: MRSA by PCR: POSITIVE — AB

## 2014-04-20 LAB — MAGNESIUM: Magnesium: 2 mg/dL (ref 1.5–2.5)

## 2014-04-20 LAB — PROTIME-INR
INR: 2.45 — AB (ref 0.00–1.49)
Prothrombin Time: 26.8 seconds — ABNORMAL HIGH (ref 11.6–15.2)

## 2014-04-20 SURGERY — REMOVAL, TOTAL ARTHROPLASTY HARDWARE, KNEE, WITH ANTIBIOTIC SPACER INSERTION
Anesthesia: General | Site: Knee | Laterality: Right

## 2014-04-20 MED ORDER — ONDANSETRON HCL 4 MG/2ML IJ SOLN
INTRAMUSCULAR | Status: DC | PRN
  Administered 2014-04-20: 4 mg via INTRAVENOUS

## 2014-04-20 MED ORDER — TIZANIDINE HCL 2 MG PO CAPS: 2.0000 mg | ORAL_CAPSULE | Freq: Three times a day (TID) | ORAL | Status: DC

## 2014-04-20 MED ORDER — STERILE WATER FOR INJECTION IJ SOLN
INTRAMUSCULAR | Status: AC
  Filled 2014-04-20: qty 10

## 2014-04-20 MED ORDER — PHENOL 1.4 % MT LIQD: 1.0000 | OROMUCOSAL | Status: DC | PRN

## 2014-04-20 MED ORDER — HYDROMORPHONE HCL 1 MG/ML IJ SOLN
0.2500 mg | INTRAMUSCULAR | Status: DC | PRN
  Administered 2014-04-20: 0.5 mg via INTRAVENOUS
  Administered 2014-04-20: 0.25 mg via INTRAVENOUS
  Administered 2014-04-20: 0.5 mg via INTRAVENOUS
  Administered 2014-04-20: 0.25 mg via INTRAVENOUS
  Administered 2014-04-20: 0.5 mg via INTRAVENOUS

## 2014-04-20 MED ORDER — PROPOFOL 10 MG/ML IV BOLUS
INTRAVENOUS | Status: AC
  Filled 2014-04-20: qty 20

## 2014-04-20 MED ORDER — OXYCODONE HCL 5 MG/5ML PO SOLN: 5.0000 mg | Freq: Once | ORAL | Status: DC | PRN

## 2014-04-20 MED ORDER — METHOCARBAMOL 500 MG PO TABS
500.0000 mg | ORAL_TABLET | Freq: Four times a day (QID) | ORAL | Status: DC | PRN
  Administered 2014-04-21: 500 mg via ORAL
  Filled 2014-04-20 (×3): qty 1

## 2014-04-20 MED ORDER — HYDROMORPHONE HCL 1 MG/ML IJ SOLN
0.5000 mg | INTRAMUSCULAR | Status: DC | PRN
  Administered 2014-04-21: 0.5 mg via INTRAVENOUS
  Filled 2014-04-20: qty 1

## 2014-04-20 MED ORDER — VANCOMYCIN HCL 1000 MG IV SOLR
INTRAVENOUS | Status: AC
  Filled 2014-04-20: qty 4000

## 2014-04-20 MED ORDER — LIDOCAINE HCL (CARDIAC) 20 MG/ML IV SOLN
INTRAVENOUS | Status: AC
  Filled 2014-04-20: qty 10

## 2014-04-20 MED ORDER — MENTHOL 3 MG MT LOZG: 1.0000 | LOZENGE | OROMUCOSAL | Status: DC | PRN

## 2014-04-20 MED ORDER — EPHEDRINE SULFATE 50 MG/ML IJ SOLN
INTRAMUSCULAR | Status: AC
  Filled 2014-04-20: qty 1

## 2014-04-20 MED ORDER — VANCOMYCIN HCL IN DEXTROSE 1-5 GM/200ML-% IV SOLN
INTRAVENOUS | Status: AC
  Filled 2014-04-20: qty 200

## 2014-04-20 MED ORDER — ONDANSETRON HCL 4 MG/2ML IJ SOLN
4.0000 mg | Freq: Four times a day (QID) | INTRAMUSCULAR | Status: DC | PRN
  Filled 2014-04-20 (×2): qty 2

## 2014-04-20 MED ORDER — HYDROMORPHONE HCL 1 MG/ML IJ SOLN
INTRAMUSCULAR | Status: AC
  Filled 2014-04-20: qty 1

## 2014-04-20 MED ORDER — TOBRAMYCIN SULFATE 1.2 G IJ SOLR
INTRAMUSCULAR | Status: AC
  Filled 2014-04-20: qty 4.8

## 2014-04-20 MED ORDER — PHENYLEPHRINE HCL 10 MG/ML IJ SOLN
INTRAMUSCULAR | Status: DC | PRN
  Administered 2014-04-20: 120 ug via INTRAVENOUS
  Administered 2014-04-20: 40 ug via INTRAVENOUS

## 2014-04-20 MED ORDER — TOBRAMYCIN SULFATE 1.2 G IJ SOLR
INTRAMUSCULAR | Status: DC | PRN
  Administered 2014-04-20: 4.8 g

## 2014-04-20 MED ORDER — MAGNESIUM CITRATE PO SOLN: 1.0000 | Freq: Once | ORAL | Status: AC | PRN

## 2014-04-20 MED ORDER — FENTANYL CITRATE 0.05 MG/ML IJ SOLN
INTRAMUSCULAR | Status: AC
  Filled 2014-04-20: qty 5

## 2014-04-20 MED ORDER — VANCOMYCIN HCL 1000 MG IV SOLR
INTRAVENOUS | Status: DC | PRN
  Administered 2014-04-20: 4 g

## 2014-04-20 MED ORDER — LIDOCAINE HCL (CARDIAC) 20 MG/ML IV SOLN
INTRAVENOUS | Status: DC | PRN
  Administered 2014-04-20: 60 mg via INTRAVENOUS

## 2014-04-20 MED ORDER — FENTANYL CITRATE 0.05 MG/ML IJ SOLN
INTRAMUSCULAR | Status: DC | PRN
  Administered 2014-04-20 (×4): 50 ug via INTRAVENOUS
  Administered 2014-04-20: 100 ug via INTRAVENOUS
  Administered 2014-04-20 (×2): 50 ug via INTRAVENOUS
  Administered 2014-04-20: 100 ug via INTRAVENOUS

## 2014-04-20 MED ORDER — KCL IN DEXTROSE-NACL 20-5-0.45 MEQ/L-%-% IV SOLN
INTRAVENOUS | Status: DC
  Administered 2014-04-20 – 2014-04-23 (×3): via INTRAVENOUS
  Filled 2014-04-20 (×16): qty 1000

## 2014-04-20 MED ORDER — BISACODYL 5 MG PO TBEC: 5.0000 mg | DELAYED_RELEASE_TABLET | Freq: Every day | ORAL | Status: DC | PRN

## 2014-04-20 MED ORDER — METHOCARBAMOL 1000 MG/10ML IJ SOLN
500.0000 mg | Freq: Four times a day (QID) | INTRAVENOUS | Status: DC | PRN
  Administered 2014-04-20: 500 mg via INTRAVENOUS
  Filled 2014-04-20 (×3): qty 5

## 2014-04-20 MED ORDER — OXYCODONE HCL 5 MG PO TABS
5.0000 mg | ORAL_TABLET | ORAL | Status: DC | PRN
  Administered 2014-04-20 – 2014-04-22 (×5): 10 mg via ORAL
  Filled 2014-04-20 (×6): qty 2

## 2014-04-20 MED ORDER — OXYCODONE-ACETAMINOPHEN 5-325 MG PO TABS: 1.0000 | ORAL_TABLET | ORAL | Status: DC | PRN

## 2014-04-20 MED ORDER — ONDANSETRON HCL 4 MG PO TABS
4.0000 mg | ORAL_TABLET | Freq: Four times a day (QID) | ORAL | Status: DC | PRN
  Filled 2014-04-20: qty 1

## 2014-04-20 MED ORDER — CHLORHEXIDINE GLUCONATE CLOTH 2 % EX PADS
6.0000 | MEDICATED_PAD | Freq: Every day | CUTANEOUS | Status: DC
  Administered 2014-04-22 – 2014-04-24 (×3): 6 via TOPICAL

## 2014-04-20 MED ORDER — ACETAMINOPHEN 650 MG RE SUPP: 650.0000 mg | Freq: Four times a day (QID) | RECTAL | Status: DC | PRN

## 2014-04-20 MED ORDER — ROCURONIUM BROMIDE 50 MG/5ML IV SOLN
INTRAVENOUS | Status: AC
  Filled 2014-04-20: qty 1

## 2014-04-20 MED ORDER — ACETAMINOPHEN 325 MG PO TABS
650.0000 mg | ORAL_TABLET | Freq: Four times a day (QID) | ORAL | Status: DC | PRN
  Administered 2014-04-21: 650 mg via ORAL
  Filled 2014-04-20 (×2): qty 2

## 2014-04-20 MED ORDER — METOCLOPRAMIDE HCL 5 MG PO TABS
5.0000 mg | ORAL_TABLET | Freq: Three times a day (TID) | ORAL | Status: DC | PRN
  Filled 2014-04-20: qty 2

## 2014-04-20 MED ORDER — SODIUM CHLORIDE 0.9 % IJ SOLN
INTRAMUSCULAR | Status: AC
  Filled 2014-04-20: qty 10

## 2014-04-20 MED ORDER — 0.9 % SODIUM CHLORIDE (POUR BTL) OPTIME
TOPICAL | Status: DC | PRN
  Administered 2014-04-20: 1000 mL

## 2014-04-20 MED ORDER — MUPIROCIN 2 % EX OINT
1.0000 "application " | TOPICAL_OINTMENT | Freq: Two times a day (BID) | CUTANEOUS | Status: DC
  Administered 2014-04-20 – 2014-04-24 (×9): 1 via NASAL
  Filled 2014-04-20 (×2): qty 22

## 2014-04-20 MED ORDER — SODIUM CHLORIDE 0.9 % IR SOLN
Status: DC | PRN
  Administered 2014-04-20 (×2): 1000 mL

## 2014-04-20 MED ORDER — DOCUSATE SODIUM 100 MG PO CAPS
100.0000 mg | ORAL_CAPSULE | Freq: Two times a day (BID) | ORAL | Status: DC
  Administered 2014-04-21 – 2014-04-23 (×5): 100 mg via ORAL
  Filled 2014-04-20 (×7): qty 1

## 2014-04-20 MED ORDER — PROMETHAZINE HCL 25 MG/ML IJ SOLN: 6.2500 mg | INTRAMUSCULAR | Status: DC | PRN

## 2014-04-20 MED ORDER — OXYCODONE HCL 5 MG PO TABS: 5.0000 mg | ORAL_TABLET | Freq: Once | ORAL | Status: DC | PRN

## 2014-04-20 MED ORDER — ONDANSETRON HCL 4 MG/2ML IJ SOLN
INTRAMUSCULAR | Status: AC
  Filled 2014-04-20: qty 2

## 2014-04-20 MED ORDER — METOCLOPRAMIDE HCL 5 MG/ML IJ SOLN
5.0000 mg | Freq: Three times a day (TID) | INTRAMUSCULAR | Status: DC | PRN
  Filled 2014-04-20: qty 2

## 2014-04-20 MED ORDER — LACTATED RINGERS IV SOLN
INTRAVENOUS | Status: DC
  Administered 2014-04-20 (×2): via INTRAVENOUS

## 2014-04-20 MED ORDER — ALUM & MAG HYDROXIDE-SIMETH 200-200-20 MG/5ML PO SUSP: 30.0000 mL | ORAL | Status: DC | PRN

## 2014-04-20 MED ORDER — VANCOMYCIN HCL 1000 MG IV SOLR
1000.0000 mg | INTRAVENOUS | Status: DC | PRN
  Administered 2014-04-20: 1000 mg via INTRAVENOUS

## 2014-04-20 MED ORDER — PROPOFOL 10 MG/ML IV BOLUS
INTRAVENOUS | Status: DC | PRN
  Administered 2014-04-20: 110 mg via INTRAVENOUS

## 2014-04-20 MED ORDER — SENNOSIDES-DOCUSATE SODIUM 8.6-50 MG PO TABS: 1.0000 | ORAL_TABLET | Freq: Every evening | ORAL | Status: DC | PRN

## 2014-04-20 MED ORDER — DIPHENHYDRAMINE HCL 12.5 MG/5ML PO ELIX: 12.5000 mg | ORAL_SOLUTION | ORAL | Status: DC | PRN

## 2014-04-20 SURGICAL SUPPLY — 60 items
BANDAGE ELASTIC 4 VELCRO ST LF (GAUZE/BANDAGES/DRESSINGS) ×3 IMPLANT
BANDAGE ELASTIC 6 VELCRO ST LF (GAUZE/BANDAGES/DRESSINGS) ×3 IMPLANT
BLADE SAW SGTL 81X20 HD (BLADE) ×3 IMPLANT
BLADE SURG 10 STRL SS (BLADE) ×3 IMPLANT
BNDG COHESIVE 4X5 TAN STRL (GAUZE/BANDAGES/DRESSINGS) ×3 IMPLANT
BNDG GAUZE ELAST 4 BULKY (GAUZE/BANDAGES/DRESSINGS) IMPLANT
BOWL SMART MIX CTS (DISPOSABLE) ×3 IMPLANT
CEMENT HV SMART SET (Cement) ×9 IMPLANT
COVER SURGICAL LIGHT HANDLE (MISCELLANEOUS) ×3 IMPLANT
CUFF TOURNIQUET SINGLE 18IN (TOURNIQUET CUFF) IMPLANT
CUFF TOURNIQUET SINGLE 24IN (TOURNIQUET CUFF) IMPLANT
CUFF TOURNIQUET SINGLE 34IN LL (TOURNIQUET CUFF) ×3 IMPLANT
CUFF TOURNIQUET SINGLE 44IN (TOURNIQUET CUFF) IMPLANT
DRAPE IMP U-DRAPE 54X76 (DRAPES) ×3 IMPLANT
DURAPREP 26ML APPLICATOR (WOUND CARE) ×3 IMPLANT
ELECT REM PT RETURN 9FT ADLT (ELECTROSURGICAL) ×3
ELECTRODE REM PT RTRN 9FT ADLT (ELECTROSURGICAL) ×1 IMPLANT
EVACUATOR 1/8 PVC DRAIN (DRAIN) ×3 IMPLANT
FACESHIELD WRAPAROUND (MASK) ×3 IMPLANT
GAUZE SPONGE 4X4 12PLY STRL (GAUZE/BANDAGES/DRESSINGS) ×3 IMPLANT
GAUZE XEROFORM 1X8 LF (GAUZE/BANDAGES/DRESSINGS) ×3 IMPLANT
GLOVE BIO SURGEON STRL SZ7.5 (GLOVE) ×3 IMPLANT
GLOVE BIO SURGEON STRL SZ8.5 (GLOVE) ×3 IMPLANT
GLOVE BIOGEL PI IND STRL 8 (GLOVE) ×1 IMPLANT
GLOVE BIOGEL PI IND STRL 9 (GLOVE) ×1 IMPLANT
GLOVE BIOGEL PI INDICATOR 8 (GLOVE) ×2
GLOVE BIOGEL PI INDICATOR 9 (GLOVE) ×2
GOWN STRL REUS W/ TWL LRG LVL3 (GOWN DISPOSABLE) ×1 IMPLANT
GOWN STRL REUS W/ TWL XL LVL3 (GOWN DISPOSABLE) ×2 IMPLANT
GOWN STRL REUS W/TWL LRG LVL3 (GOWN DISPOSABLE) ×2
GOWN STRL REUS W/TWL XL LVL3 (GOWN DISPOSABLE) ×4
HANDPIECE INTERPULSE COAX TIP (DISPOSABLE) ×2
HOOD SURGICAL BLUE (PROTECTIVE WEAR) ×6 IMPLANT
IMMOBILIZER KNEE 22 (SOFTGOODS) ×3 IMPLANT
KIT BASIN OR (CUSTOM PROCEDURE TRAY) ×3 IMPLANT
KIT ROOM TURNOVER OR (KITS) ×3 IMPLANT
MANIFOLD NEPTUNE II (INSTRUMENTS) ×3 IMPLANT
NS IRRIG 1000ML POUR BTL (IV SOLUTION) ×3 IMPLANT
PACK ORTHO EXTREMITY (CUSTOM PROCEDURE TRAY) ×3 IMPLANT
PACK UNIVERSAL I (CUSTOM PROCEDURE TRAY) ×3 IMPLANT
PAD ARMBOARD 7.5X6 YLW CONV (MISCELLANEOUS) ×6 IMPLANT
PAD CAST 4YDX4 CTTN HI CHSV (CAST SUPPLIES) ×1 IMPLANT
PADDING CAST COTTON 4X4 STRL (CAST SUPPLIES) ×2
PADDING CAST COTTON 6X4 STRL (CAST SUPPLIES) ×3 IMPLANT
PENCIL BUTTON HOLSTER BLD 10FT (ELECTRODE) ×3 IMPLANT
SET HNDPC FAN SPRY TIP SCT (DISPOSABLE) ×1 IMPLANT
SPONGE LAP 18X18 X RAY DECT (DISPOSABLE) ×3 IMPLANT
STOCKINETTE IMPERVIOUS 9X36 MD (GAUZE/BANDAGES/DRESSINGS) ×3 IMPLANT
SUT ETHILON 3 0 PS 1 (SUTURE) ×3 IMPLANT
SUT VIC AB 0 CT1 27 (SUTURE) ×4
SUT VIC AB 0 CT1 27XBRD ANBCTR (SUTURE) ×2 IMPLANT
SUT VIC AB 3-0 FS2 27 (SUTURE) ×3 IMPLANT
TOWEL OR 17X24 6PK STRL BLUE (TOWEL DISPOSABLE) ×3 IMPLANT
TOWEL OR 17X26 10 PK STRL BLUE (TOWEL DISPOSABLE) ×3 IMPLANT
TUBE ANAEROBIC SPECIMEN COL (MISCELLANEOUS) IMPLANT
TUBE CONNECTING 12'X1/4 (SUCTIONS) ×1
TUBE CONNECTING 12X1/4 (SUCTIONS) ×2 IMPLANT
UNDERPAD 30X30 INCONTINENT (UNDERPADS AND DIAPERS) ×3 IMPLANT
WATER STERILE IRR 1000ML POUR (IV SOLUTION) IMPLANT
YANKAUER SUCT BULB TIP NO VENT (SUCTIONS) ×3 IMPLANT

## 2014-04-20 NOTE — Progress Notes (Signed)
Utilization review completed.  

## 2014-04-20 NOTE — Progress Notes (Signed)
PT Cancellation Note  Patient Details Name: Randy [name] MRN: [id number] DOB: 1924-02-21   Cancelled Treatment:    Reason Eval/Treat Not Completed: Patient not medically ready.  Pt going to OR today for knee surgery @ ~12:40 pm today.  PT will hold off until post-op orders written by ortho MD.  PT to check for orders in AM.   Thanks,    Wells Guiles B. Damione Robideau, PT, DPT [phone]   04/20/2014, 9:30 AM

## 2014-04-20 NOTE — H&P (View-Only) (Signed)
Reason for Consult: Infected right total knee  Referring Physician: Dr. Sherilyn [name] is an 81 y.o. male.   HPI:  Randy [name] is here today with complaint of right knee pain.  [name] states that over Randy few days  Randy [name]'s noticed increased pain and some swelling as well as redness on Randy right knee.  Randy [name] does have a history of a right total knee arthroplasty performed by Dr. Stana Bunting and states that on 2010.  Randy [name] has had a very stiff knee following his knee replacement most likely due to noncompliance with physical therapy.  Again a proximally 1 week ago Randy [name] noticed increased pain and swelling on Randy knee.  Randy [name] cannot recall a specific injury.  Randy [name] denies any fevers chills night sweats or other signs of infection.  Today, Randy [name] rates his pain as an 8/10 with any movement but states it is minimal at rest.  Randy [name] is here today with his caretaker, she reports that Randy [name] was hospitalized from 1/24-1/27 2016, with urosepsis and grew Escherichia coli out of both Randy urine and his blood.  His PICC line was discontinued 4-5 days ago.  In Randy last day or 2, Randy [name] has  grown somewhat weaker, with some crackles when Randy [name] breathes.  Past Medical History  Diagnosis Date  . Diabetes mellitus without complication   . Anxiety   . MI (myocardial infarction)   . Hypertension   . Hyperlipidemia     Past Surgical History  Procedure Laterality Date  . Knee surgery    . Cholecystectomy    . Hemorroidectomy    . Pacemaker insertion      Family History  Problem Relation Age of Onset  . Heart attack Mother   . Heart attack Father     Social History:  reports that Randy [name] has quit smoking. Randy [name] has never used smokeless tobacco. Randy [name] reports that Randy [name] does not drink alcohol or use illicit drugs.  Allergies: No Known Allergies  Medications: I have reviewed Randy [name]'s current medications.  Results for orders placed or performed during Randy hospital encounter of 04/19/14 (from Randy past 48 hour(s))  CBC with  Differential/Platelet     Status: Abnormal   Collection Time: 04/19/14  3:30 PM  Result Value Ref Range   WBC 9.1 4.0 - 10.5 K/uL   RBC 4.85 4.22 - 5.81 MIL/uL   Hemoglobin 13.2 13.0 - 17.0 g/dL   HCT 40.7 39.0 - 52.0 %   MCV 83.9 78.0 - 100.0 fL   MCH 27.2 26.0 - 34.0 pg   MCHC 32.4 30.0 - 36.0 g/dL   RDW 16.5 (H) 11.5 - 15.5 %   Platelets 490 (H) 150 - 400 K/uL   Neutrophils Relative % 71 43 - 77 %   Neutro Abs 6.4 1.7 - 7.7 K/uL   Lymphocytes Relative 17 12 - 46 %   Lymphs Abs 1.6 0.7 - 4.0 K/uL   Monocytes Relative 9 3 - 12 %   Monocytes Absolute 0.8 0.1 - 1.0 K/uL   Eosinophils Relative 3 0 - 5 %   Eosinophils Absolute 0.3 0.0 - 0.7 K/uL   Basophils Relative 0 0 - 1 %   Basophils Absolute 0.0 0.0 - 0.1 K/uL  Urinalysis, Routine w reflex microscopic     Status: Abnormal   Collection Time: 04/19/14  3:30 PM  Result Value Ref Range   Color, Urine YELLOW YELLOW   APPearance TURBID (A) CLEAR   Specific Gravity, Urine 1.016 1.005 -  1.030   pH 5.5 5.0 - 8.0   Glucose, UA NEGATIVE NEGATIVE mg/dL   Hgb urine dipstick MODERATE (A) NEGATIVE   Bilirubin Urine NEGATIVE NEGATIVE   Ketones, ur NEGATIVE NEGATIVE mg/dL   Protein, ur 30 (A) NEGATIVE mg/dL   Urobilinogen, UA 0.2 0.0 - 1.0 mg/dL   Nitrite NEGATIVE NEGATIVE   Leukocytes, UA LARGE (A) NEGATIVE  Urine microscopic-add on     Status: Abnormal   Collection Time: 04/19/14  3:30 PM  Result Value Ref Range   Squamous Epithelial / LPF FEW (A) RARE   WBC, UA TOO NUMEROUS TO COUNT <3 WBC/hpf   RBC / HPF 11-20 <3 RBC/hpf   Bacteria, UA MANY (A) RARE    Dg Chest 2 View  04/19/2014   CLINICAL DATA:  Hypertension, diabetes, past smoker right knee pain  EXAM: CHEST  2 VIEW  COMPARISON:  03/13/2014  FINDINGS: Cardiomegaly again noted. Chronic elevation of Randy right hemidiaphragm. No acute infiltrate or pleural effusion. No pulmonary edema. Dual lead cardiac pacemaker with leads in right atrium and right ventricle. Osteopenia and mild  degenerative changes thoracic spine.  IMPRESSION: No active disease.  Dual lead cardiac pacemaker in place.   Electronically Signed   By: Lahoma Crocker M.D.   On: 04/19/2014 16:10   Dg Knee Complete 4 Views Right  04/19/2014   CLINICAL DATA:  Knee pain, status post knee replacement 2010  EXAM: RIGHT KNEE - COMPLETE 4+ VIEW  COMPARISON:  None.  FINDINGS: Four views of Randy right knee submitted. No acute fracture or subluxation. There is right knee prosthesis in anatomic alignment. Spurring of patella. Small joint effusion. There is no evidence of loosening of prosthesis.  IMPRESSION: No acute fracture or subluxation. Right knee prosthesis in anatomic alignment. No evidence of loosening of prosthesis. Spurring of patella. Small joint effusion.   Electronically Signed   By: Lahoma Crocker M.D.   On: 04/19/2014 16:05    ROS  [name] denies any chest pains or shortness of breath.  Blood pressure 138/51, pulse 70, temperature 97.6 F (36.4 C), temperature source Oral, resp. rate 18, weight 89.8 kg (197 lb 15.6 oz), SpO2 99 %.   Physical Exam   Blood pressure 113/64, temperature 98.7, pulse 69.  Well-nourished well-developed [name] seated on Randy exam table in no obvious distress no shortness of breath.  [name] is a pleasant 81 year old male in no acute distress.  Randy [name] is alert and oriented x3.  His skin is intact.  [name]'s respirations are regular and slightly labored.  Today, Randy [name] has some erythema or warmth as well as redness over Randy superior lateral portion of his knee.  Randy [name] does have an area of fluctuance.  Randy [name] continues to have a range from 30-45 with an obvious flexion contracture.  No noticeable laxity.  His calves are soft and nontender.  Randy [name] does have chronic venous stasis and skin changes over bilateral lower extremities.  RADIOGRAPHS:  X-rays were ordered, performed, and interpreted by myself and Dr. Mayer Camel today included; AP and lateral views of Randy right knee are taken and reviewed in office  today.  No fracture dislocation subluxation or tumors identified.  After discussing options and obtaining informed consent, Randy area of fluctuance was cleansed with alcohol, anesthetized with ethyl chloride and we aspirated 15 cc of cloudy oranges reddish fluid.  Randy [name] has Randy [name] also had an effusion, we went to Randy other side of Randy total knee where was noninflamed likewise prepped Randy skin and aspirated  another 35 cc of similar purulent fluid.  This was sent off for stat Gram stain, culture and cell count.   Assessment/Plan:  Assess: [name] with urosepsis in late January 2016, blood cultures grew out Escherichia coli, just completed PICC line last week,.  Randy [name] developed effusion and pain and right total knee with weakness, aspirated almost 40 cc of pus from total knee today.   Plan: Treatment options are discussed with Randy [name] and Dr. Mayer Camel.  At this time Randy [name] will be sent to [facility] to be admitted by [facility]  hospitalist, spoke with Dr. Tana Coast.  Unfortunately, there are no beds available and Randy [name] will have to be admitted through Randy emergency room.  After preoperative evaluation and clearance by Randy hospitalist, Randy plan is to have Randy [name] undergo a irrigation and debridement with removal of parts and placement of an antibiotic spacer.  Randy [name]'ll likely be on IV antibiotics following Randy procedure and Randy [name] will have a consult with infectious disease.  Because of his age, diabetes and cardiac history this [name] is at a high risk for complications.  [name] is told there are certainly risks with a procedure of this magnitude.  Randy potential complications of surgery are discussed with Randy [name].  Surgeries plan for 04/20/14 at 1240 to include irrigation and debridement as well as removal of an infected total knee and placement of antibiotic spacer.  PHILLIPS, ERIC R 04/19/2014, 5:19 PM

## 2014-04-20 NOTE — Anesthesia Procedure Notes (Signed)
Procedure Name: LMA Insertion Date/Time: 04/20/2014 12:58 PM Performed by: Mariea Clonts Pre-anesthesia Checklist: Patient identified, Emergency Drugs available, Patient being monitored, Suction available and Timeout performed Patient Re-evaluated:Patient Re-evaluated prior to inductionOxygen Delivery Method: Circle system utilized Preoxygenation: Pre-oxygenation with 100% oxygen Intubation Type: IV induction LMA: LMA inserted LMA Size: 5.0 Number of attempts: 1 Placement Confirmation: breath sounds checked- equal and bilateral and positive ETCO2 Tube secured with: Tape Dental Injury: Teeth and Oropharynx as per pre-operative assessment

## 2014-04-20 NOTE — Anesthesia Preprocedure Evaluation (Addendum)
Anesthesia Evaluation  Patient identified by MRN, date of birth, ID band Patient awake    Reviewed: Allergy & Precautions, NPO status , Patient's Chart, lab work & pertinent test results  History of Anesthesia Complications Negative for: history of anesthetic complications  Airway Mallampati: II  TM Distance: >3 FB Neck ROM: Full    Dental  (+) Teeth Intact, Dental Advisory Given   Pulmonary former smoker,    Pulmonary exam normal       Cardiovascular hypertension, Pt. on medications + Past MI  Impressions:  - Technically difficult study, contrast did not improve images much. LVEF 55-60%, moderate LVH, diastolic dysfunction, indeterminate LV filling pressure, moderate TR, RVSP 56 mmHg, Right-sided structures and IVC not well visualized.    Neuro/Psych Anxiety negative neurological ROS     GI/Hepatic negative GI ROS, Neg liver ROS,   Endo/Other  diabetes  Renal/GU Renal InsufficiencyRenal disease     Musculoskeletal   Abdominal   Peds  Hematology   Anesthesia Other Findings   Reproductive/Obstetrics                           Anesthesia Physical Anesthesia Plan  ASA: III  Anesthesia Plan: General   Post-op Pain Management:    Induction: Intravenous  Airway Management Planned: LMA  Additional Equipment:   Intra-op Plan:   Post-operative Plan: Extubation in OR  Informed Consent: I have reviewed the patients History and Physical, chart, labs and discussed the procedure including the risks, benefits and alternatives for the proposed anesthesia with the patient or authorized representative who has indicated his/her understanding and acceptance.   Dental advisory given  Plan Discussed with: CRNA, Anesthesiologist and Surgeon  Anesthesia Plan Comments:        Anesthesia Quick Evaluation

## 2014-04-20 NOTE — Transfer of Care (Signed)
Immediate Anesthesia Transfer of Care Note  Patient: Randy [name]  Procedure(s) Performed: Procedure(s): IRRIGATION AND DEBRIDMENT RIGHT TOTAL KNEE REMOVE  ACL IMPLANTED AND PLACE SPACER (Right)  Patient Location: PACU  Anesthesia Type:General  Level of Consciousness: sedated  Airway & Oxygen Therapy: Patient Spontanous Breathing and Patient connected to nasal cannula oxygen  Post-op Assessment: Report given to RN and Post -op Vital signs reviewed and stable  Post vital signs: stable  Last Vitals:  Filed Vitals:   04/20/14 0550  BP: 110/45  Pulse: 70  Temp: 36.4 C  Resp: 17    Complications: No apparent anesthesia complications

## 2014-04-20 NOTE — Progress Notes (Signed)
Subjective: Patient feels "good" this morning. No pain unless he moves his right leg. Eager to learn about the plan for the day.  Objective: Vital signs in last 24 hours: Filed Vitals:   04/19/14 2048 04/19/14 2129 04/20/14 0121 04/20/14 0550  BP:  127/42 114/49 110/45  Pulse:  70 71 70  Temp:  98.3 F (36.8 C) 98.3 F (36.8 C) 97.6 F (36.4 C)  TempSrc: Oral Oral Oral Oral  Resp: 17 18 19 17   Weight:      SpO2:  100% 98% 100%   Weight change:   Intake/Output Summary (Last 24 hours) at 04/20/14 0830 Last data filed at 04/20/14 9163  Gross per 24 hour  Intake      0 ml  Output    300 ml  Net   -300 ml   Physical Exam: General: resting in bed, NAD, appears younger than stated age 78: Left pupil fixed and dilated without any response to light, right pupillary light response intact, EOMI, no scleral icterus, metal fillings noted in his oropharynx Cardiac: RRR, no rubs, murmurs or gallops Pulm: clear to auscultation bilaterally, no wheezes, rales, or rhonchi Abd: soft, nontender, nondistended, BS present Ext: Pale extremities, right knee with increased swelling as compared to the left knee, some faint erythema. Right lower extremity also with chronic venous stasis disease.      Neuro: responds to questions appropriately; moving all extremities freely  Lab Results: Basic Metabolic Panel:  Recent Labs Lab 04/19/14 1652 04/20/14 0633  NA 139 139  K 4.1 4.1  CL 103 106  CO2 29 23  GLUCOSE 115* 118*  BUN 15 14  CREATININE 1.57* 1.48*  CALCIUM 8.7 8.7  MG  --  2.0  PHOS  --  3.1   Liver Function Tests:  Recent Labs Lab 04/19/14 1652  AST 25  ALT 16  ALKPHOS 69  BILITOT 0.4  PROT 7.3  ALBUMIN 2.9*   CBC:  Recent Labs Lab 04/19/14 1530 04/20/14 0633  WBC 9.1 7.5  NEUTROABS 6.4  --   HGB 13.2 11.7*  HCT 40.7 36.9*  MCV 83.9 83.9  PLT 490* 464*   CBG:  Recent Labs Lab 04/19/14 2135 04/20/14 0655  GLUCAP 121* 119*    Coagulation:  Recent Labs Lab 04/19/14 1652 04/20/14 0633  LABPROT 27.4* 26.8*  INR 2.52* 2.45*   Urinalysis:  Recent Labs Lab 04/19/14 1530  COLORURINE YELLOW  LABSPEC 1.016  PHURINE 5.5  GLUCOSEU NEGATIVE  HGBUR MODERATE*  BILIRUBINUR NEGATIVE  KETONESUR NEGATIVE  PROTEINUR 30*  UROBILINOGEN 0.2  NITRITE NEGATIVE  LEUKOCYTESUR LARGE*    Micro Results: Recent Results (from the past 240 hour(s))  MRSA PCR Screening     Status: Abnormal   Collection Time: 04/20/14  1:21 AM  Result Value Ref Range Status   MRSA by PCR POSITIVE (A) NEGATIVE Final    Comment:        The GeneXpert MRSA Assay (FDA approved for NASAL specimens only), is one component of a comprehensive MRSA colonization surveillance program. It is not intended to diagnose MRSA infection nor to guide or monitor treatment for MRSA infections. RESULT CALLED TO, READ BACK BY AND VERIFIED WITH: CALLED TO RN Alfonso Ellis [phone] @[phone]  THANEY    Studies/Results: Dg Chest 2 View  04/19/2014   CLINICAL DATA:  Hypertension, diabetes, past smoker right knee pain  EXAM: CHEST  2 VIEW  COMPARISON:  03/13/2014  FINDINGS: Cardiomegaly again noted. Chronic elevation of the right hemidiaphragm. No  acute infiltrate or pleural effusion. No pulmonary edema. Dual lead cardiac pacemaker with leads in right atrium and right ventricle. Osteopenia and mild degenerative changes thoracic spine.  IMPRESSION: No active disease.  Dual lead cardiac pacemaker in place.   Electronically Signed   By: Lahoma Crocker M.D.   On: 04/19/2014 16:10   Dg Knee Complete 4 Views Right  04/19/2014   CLINICAL DATA:  Knee pain, status post knee replacement 2010  EXAM: RIGHT KNEE - COMPLETE 4+ VIEW  COMPARISON:  None.  FINDINGS: Four views of the right knee submitted. No acute fracture or subluxation. There is right knee prosthesis in anatomic alignment. Spurring of patella. Small joint effusion. There is no evidence of loosening of prosthesis.   IMPRESSION: No acute fracture or subluxation. Right knee prosthesis in anatomic alignment. No evidence of loosening of prosthesis. Spurring of patella. Small joint effusion.   Electronically Signed   By: Lahoma Crocker M.D.   On: 04/19/2014 16:05   Medications: I have reviewed the patient's current medications. Scheduled Meds: . antiseptic oral rinse  7 mL Mouth Rinse BID  . atorvastatin  5 mg Oral Daily  . busPIRone  15 mg Oral BID  . carvedilol  3.125 mg Oral BID WC  . Chlorhexidine Gluconate Cloth  6 each Topical Q0600  . [START ON 04/21/2014] clopidogrel  75 mg Oral Daily  . famotidine  20 mg Oral BID  . feeding supplement (ENSURE COMPLETE)  237 mL Oral BID BM  . insulin aspart  0-15 Units Subcutaneous TID WC  . mupirocin ointment  1 application Nasal BID  . piperacillin-tazobactam (ZOSYN)  IV  3.375 g Intravenous 3 times per day  . tamsulosin  0.4 mg Oral Daily   Continuous Infusions:  PRN Meds:.hydrocerin, liver oil-zinc oxide Assessment/Plan: Principal Problem:   Septic joint of right knee joint Active Problems:   Hyperlipidemia   Hypertension   Anxiety   History of MI (myocardial infarction)   UTI (lower urinary tract infection)   History of DVT (deep vein thrombosis)   Diabetes mellitus without complication   Chronic kidney disease (CKD), stage III (moderate)  Randy [name] is an 81 yo man with a history of hypertension, hyperlipidemia, DM2, MI and CKD who had a right knee total arthroplasty in 2010 and presented on 3/3 with septic arthritis of the right knee.   Septic Right Knee Joint: Developed at site of hardware nidus in context of recent sepsis (which was treated with IV abx). UA significant for many bacteria and large leukocytes, but patient denies symptoms.  - Irrigation and debridement with removal of parts and placement of an antibiotic spacer today at 12:40PM - Continue zosyn to cover e coli (previous agent in urosepsis) - Follow up cell count, culture, gram stain  from knee effusion  - Urine culture pending - Appreciate PT/OT consult - Appreciate orthopaedics consult  Chronic Anticoagulation: Currently on warfarin 5 mg daily with INR 2.45.  - Warfarin was held on admission; to be continued once cleared post-operatively - MAY NEED REVERSAL PRIOR TO SURGERY (FFP)  DM2: Last A1c 6.9% in 02/2013. - SSI  CAD: On home plavix. - Held Plavix 75 mg daily  CKD Stage III: Creatinine at baseline (1.6). - Continue to trend  BPH: Continue home Flomax.  - Continue Flomax 0.4 mg daily  Hyperlipidemia: On home atorvastatin - Continue atorvastatin 5 mg daily  Hypertension: On home coreg  - Continue coreg 3.125 mg BID  GERD: On daily pepcid. - Continue pepcid  20 mg BID  Anxiety: On BuSpar at home. - Continue BuSpar 15 mg BID  Diet: NPO prior to surgery  DVT Ppx: SCDs  Code Status: Full  -Defer to caregivers Randy [name] [[phone]] and Randy [name] [phone]] if patient lacks decision-making capacity -Confirmed with patient on admission  Dispo: Disposition is deferred at this time, awaiting improvement of current medical problems.  Anticipated discharge in approximately 2 day(s).   The patient does not have a current PCP (Pcp Not In System) and does not know need an [facility] hospital follow-up appointment after discharge.  The patient does not know have transportation limitations that hinder transportation to clinic appointments.  .Services Needed at time of discharge: Y = Yes, Blank = No PT:   OT:   RN:   Equipment:   Other:     LOS: 1 day   Drucilla Schmidt, MD 04/20/2014, 8:30 AM

## 2014-04-20 NOTE — Anesthesia Postprocedure Evaluation (Signed)
Anesthesia Post Note  Patient: Randy [name]  Procedure(s) Performed: Procedure(s) (LRB): IRRIGATION AND DEBRIDMENT RIGHT TOTAL KNEE REMOVE  ACL IMPLANTED AND PLACE SPACER (Right)  Anesthesia type: general  Patient location: PACU  Post pain: Pain level controlled  Post assessment: Patient's Cardiovascular Status Stable  Last Vitals:  Filed Vitals:   04/20/14 1606  BP: 126/52  Pulse: 70  Temp:   Resp: 27    Post vital signs: Reviewed and stable  Level of consciousness: sedated  Complications: No apparent anesthesia complications

## 2014-04-20 NOTE — Progress Notes (Signed)
OT Cancellation Note  Patient Details Name: Randy [name] MRN: [id number] DOB: Mar 21, 1924   Cancelled Treatment:    Reason Eval/Treat Not Completed: Other (comment) Note plan for surgery today. Will await post-op orders by ortho MD. Will check for orders in am.  Jules Schick  [phone] 04/20/2014, 10:24 AM

## 2014-04-20 NOTE — Interval H&P Note (Signed)
History and Physical Interval Note:  04/20/2014 12:42 PM  Randy [name]  has presented today for surgery, with the diagnosis of INFECTED RIGHT TOTAL KNEE   The various methods of treatment have been discussed with the patient and family. After consideration of risks, benefits and other options for treatment, the patient has consented to  Procedure(s): IRRIGATION AND DEBRIDMENT RIGHT TOTAL KNEE REMOVE  ACL IMPLANTED AND PLACE SPACER (Right) as a surgical intervention .  The patient's history has been reviewed, patient examined, no change in status, stable for surgery.  I have reviewed the patient's chart and labs.  Questions were answered to the patient's satisfaction.     Kerin Salen

## 2014-04-20 NOTE — Progress Notes (Signed)
Pt seen and examined. Case d/w residents in detail.  In brief, patient is an 81 y/o male with PMH of R TKA, DM, CAD s/p MI, HTN, HLD who p/w R knee pain and swelling from ortho office. Patient was noted to have right knee pain, swelling and redness at home by care giver and was taken to ortho where he was found to have an effusion which was aspirated and 40 ml of purulent fluid was sent for culture and he was referred to the hospital.  Of note, patient was hospitalized from 1/24 to 1/27 for E. Coli urosepsis which was treated with imipenem/cilastin and had his PICC line dc'd 4-5 days ago after completing his course. No fevers/chills, no cp, no sob, no abd pain, no n/v, no dysuria.  Physical Exam: Gen: AAO*3, NAD CVS: RRR, normal heart sounds Pulm: CTA b/l Abd: soft, non tender, BS + Ext: R knee with increased swelling, erythema and local warmth    Assessment and Plan: 81 y/o male with R knee pain and swelling with likely septic arthritis  R knee septic joint: - Ortho following. Patient to have removal of R knee prosthesis and placement of an antibiotic spacer today - c/w zosyn for now - f/u outpatient culture results - PT/OT to evaluate post op  CKD: - Cr at baseline. Will monitor

## 2014-04-20 NOTE — Op Note (Signed)
PATIENT ID:      Randy [name]  MRN:     [id number] DOB/AGE:    [birth year] / 81 y.o.       OPERATIVE REPORT    DATE OF PROCEDURE:  04/20/2014       PREOPERATIVE DIAGNOSIS:   INFECTED RIGHT TOTAL KNEE       Estimated body mass index is 31 kg/(m^2) as calculated from the following:   Height as of 03/11/14: 5\' 7"  (1.702 m).   Weight as of this encounter: 89.8 kg (197 lb 15.6 oz).                                                        POSTOPERATIVE DIAGNOSIS:   INFECTED RIGHT TOTAL KNEE                                                                       PROCEDURE: Radical irrigation and debridement of infected R otal knee arthroplasty with removal of implants and placement of antibiotic impregnated PMMA spacer     SURGEON: Randy [name]    ASSISTANT:   Randy [name]  (present throughout entire procedure and necessary for timely completion of the procedure)  ANESTHESIA: GET   DRAINS: foley, 2 medium hemovac in knee   TOURNIQUET TIME: 65 min   COMPLICATIONS:  None     SPECIMENS: Infected Synovial fluid and tissue for Gram stain and culture.   INDICATIONS FOR PROCEDURE: Patient is status post total knee arthroplasty using [place] AMK prosthesis, placed in stay still [place], by Dr. Chana [name] in 2010. Patient recently moved to [facility] to stay with his family. He is now 81 years old with moderate dementia and lives with the family with help in [place]. In January 2016 he developed urosepsis with Escherichia coli in his blood. He recently finished IV antibiotics and his PICC line was removed 7 days ago. 3 days ago the knee swelled up and we saw him in the office yesterday aspirated his knee and got 40 mL of orange red pus out of the knee. He was admitted to the hospital for radical irrigation and debridement and removal of the prosthesis. We will place an antibiotic impregnated spacer. Because of his dementia and limited rehabilitation potential the plan is to leave  the spacer in place and the should function well for him. The risks and benefits of surgery have been discussed with the patient is family and his caretaker.    DESCRIPTION OF PROCEDURE: The patient identified by armband, received  And IV antibiotics, in the holding area at [facility]. Patient taken to the operating room, appropriate anesthetic  monitors were attached General endotracheal anesthesia induced with  the patient in supine position, Foley catheter was inserted. Tourniquet  applied high to the operative thigh. Lateral post and foot positioner  applied to the table, the lower extremity was then prepped and draped  in usual sterile fashion from the ankle to the tourniquet. Time-out procedure was performed. The limb was kept elevated during the prep to drain blood  without compressing the infected region.  The tourniquet was then inflated to 30 mmHg. We began the operation by making the anterior midline incision starting at handbreadth above the patella going over the patella 1 cm medial to and  6 cm distal to the tibial tubercle. Small bleeders in the skin and the  subcutaneous tissue identified and cauterized. Transverse retinaculum was incised and reflected medially and a medial parapatellar arthrotomy was accomplished. We immediately encountered purulent material and inflamed synovium and specimens were sent for Gram stain and culture.the patella was everted and the superficial medial collateral ligament was then elevated from anterior to posterior along the proximal  flare of the tibia. We then set about sharply excising all inflamed synovium that was visible with the patella everted and the knee flexed.  Polyethylene bearing was then removed sharply with an osteotome.  With the knee hyperflexed.  We then used the thin osteotomes underneath the anterior chamfer, and distal cuts of the femoral component to remove it from the distal femur.  Curettes and rongeurs were then used to remove  bits of bone cement that were still embedded in the femur.  With the knee hyperflexed and the tibia externally rotated.  We then removed inflamed tissue from around the tibial tray, and using thin osteotomes and an oscillating saw disrupted the interface between the tibial implant and the tibial plateaus.  A mallet and a punch from the [employer] [facility] cement revision set were then used to extract the tibial component. Again, curettes, osteotomes, and rongeurs were used to extract cement from the tibial canal.  We remove the patella with thin osteotomes and then used drills to remove cement from the patella. We also removed inflamed posterior synovium now that the implants were out.  The wound was then thoroughly irrigated out with normal saline solution, and all surfaces dried with sponges.  A quadruple batch of DePuy HV cement was then mixed with 3 1.5gm vials of tobramycin and 41 g vials of vancomycin. With traction and slight flexion applied to the knee.  A cement spacer was custom fit between the femur and the tibia, with a small spacer applied to the patella.  With the knee flexed 15 the cement was allowed to cure and kept cool with pulse lavage.  After the cement had cured, 2 large bore Hemovac drains were placed in the wound. We then closed in layers with running 0 Vicryl in the capsule, 3-0 undyed Vicryl in the subcutaneous tissue and skin staples. A dressing of Xeroform, 4 x 4 dressings, sponges, web roll, Ace wrap and a knee immobilizer were then placed. The patient was then awakened, extubated, and taken to recovery room without difficulty.   Randy [name] 04/20/2014, 2:54 PM

## 2014-04-21 LAB — GLUCOSE, CAPILLARY
GLUCOSE-CAPILLARY: 197 mg/dL — AB (ref 70–99)
GLUCOSE-CAPILLARY: 196 mg/dL — AB (ref 70–99)
GLUCOSE-CAPILLARY: 170 mg/dL — AB (ref 70–99)
Glucose-Capillary: 190 mg/dL — ABNORMAL HIGH (ref 70–99)

## 2014-04-21 LAB — PROTIME-INR
INR: 2.72 — ABNORMAL HIGH (ref 0.00–1.49)
Prothrombin Time: 29.1 seconds — ABNORMAL HIGH (ref 11.6–15.2)

## 2014-04-21 LAB — CBC
HEMATOCRIT: 32.9 % — AB (ref 39.0–52.0)
Hemoglobin: 10.6 g/dL — ABNORMAL LOW (ref 13.0–17.0)
MCH: 27 pg (ref 26.0–34.0)
MCHC: 32.2 g/dL (ref 30.0–36.0)
MCV: 83.9 fL (ref 78.0–100.0)
Platelets: 436 10*3/uL — ABNORMAL HIGH (ref 150–400)
RBC: 3.92 MIL/uL — ABNORMAL LOW (ref 4.22–5.81)
RDW: 16.4 % — ABNORMAL HIGH (ref 11.5–15.5)
WBC: 8.5 10*3/uL (ref 4.0–10.5)

## 2014-04-21 LAB — BASIC METABOLIC PANEL
Anion gap: 7 (ref 5–15)
BUN: 14 mg/dL (ref 6–23)
CALCIUM: 8.1 mg/dL — AB (ref 8.4–10.5)
CHLORIDE: 103 mmol/L (ref 96–112)
CO2: 25 mmol/L (ref 19–32)
Creatinine, Ser: 1.61 mg/dL — ABNORMAL HIGH (ref 0.50–1.35)
GFR calc non Af Amer: 37 mL/min — ABNORMAL LOW (ref >90)
GFR, EST AFRICAN AMERICAN: 43 mL/min — AB (ref >90)
Glucose, Bld: 202 mg/dL — ABNORMAL HIGH (ref 70–99)
Potassium: 4.5 mmol/L (ref 3.5–5.1)
Sodium: 135 mmol/L (ref 135–145)

## 2014-04-21 MED ORDER — INSULIN NPH (HUMAN) (ISOPHANE) 100 UNIT/ML ~~LOC~~ SUSP
10.0000 [IU] | Freq: Two times a day (BID) | SUBCUTANEOUS | Status: DC
  Administered 2014-04-22: 10 [IU] via SUBCUTANEOUS
  Filled 2014-04-21 (×2): qty 10

## 2014-04-21 NOTE — Progress Notes (Signed)
Subjective: Fells better today. Did not remember that he had surgery yesterday. Has a drain in place. No complaints.   Objective: Vital signs in last 24 hours: Filed Vitals:   04/21/14 0400 04/21/14 0500 04/21/14 0800 04/21/14 1009  BP:  117/38  122/40  Pulse:  76    Temp:  98.5 F (36.9 C)    TempSrc:  Oral    Resp: 18  18   Weight:  210 lb (95.255 kg)    SpO2: 98% 98%     Weight change: 12 lb 0.4 oz (5.455 kg)  Intake/Output Summary (Last 24 hours) at 04/21/14 1236 Last data filed at 04/21/14 0946  Gross per 24 hour  Intake 2003.33 ml  Output   1030 ml  Net 973.33 ml   Physical Exam: General: NAD, eating breakfast HEENT: AT, [place], EOMI. Cardiac: RRR, no added sounds appreciated. Pulm: clear to auscultation bilaterally, no wheezes, rales, or rhonchi Abd: soft, nontender, nondistended, BS present Ext: Brace extending from thing to ankle, DP pulse present both feet.  Neuro- Alert, knows he is in the hospital doesn't know which, knows DOB.  Lab Results: Basic Metabolic Panel:  Recent Labs Lab 04/20/14 0633 04/21/14 0405  NA 139 135  K 4.1 4.5  CL 106 103  CO2 23 25  GLUCOSE 118* 202*  BUN 14 14  CREATININE 1.48* 1.61*  CALCIUM 8.7 8.1*  MG 2.0  --   PHOS 3.1  --    Liver Function Tests:  Recent Labs Lab 04/19/14 1652  AST 25  ALT 16  ALKPHOS 69  BILITOT 0.4  PROT 7.3  ALBUMIN 2.9*   CBC:  Recent Labs Lab 04/19/14 1530 04/20/14 0633 04/21/14 0405  WBC 9.1 7.5 8.5  NEUTROABS 6.4  --   --   HGB 13.2 11.7* 10.6*  HCT 40.7 36.9* 32.9*  MCV 83.9 83.9 83.9  PLT 490* 464* 436*   CBG:  Recent Labs Lab 04/20/14 0655 04/20/14 1142 04/20/14 1540 04/20/14 1814 04/21/14 0641 04/21/14 1225  GLUCAP 119* 130* 172* 187* 197* 190*   Coagulation:  Recent Labs Lab 04/19/14 1652 04/20/14 0633 04/21/14 0405  LABPROT 27.4* 26.8* 29.1*  INR 2.52* 2.45* 2.72*   Urinalysis:  Recent Labs Lab 04/19/14 1530  COLORURINE YELLOW  LABSPEC 1.016    PHURINE 5.5  GLUCOSEU NEGATIVE  HGBUR MODERATE*  BILIRUBINUR NEGATIVE  KETONESUR NEGATIVE  PROTEINUR 30*  UROBILINOGEN 0.2  NITRITE NEGATIVE  LEUKOCYTESUR LARGE*   Studies/Results: Dg Chest 2 View  04/19/2014   CLINICAL DATA:  IMPRESSION: No active disease.  Dual lead cardiac pacemaker in place.   Electronically Signed   By: Lahoma Crocker M.D.   On: 04/19/2014 16:10   Dg Knee Complete 4 Views Right  04/19/2014   CLINICAL DATA:   IMPRESSION: No acute fracture or subluxation. Right knee prosthesis in anatomic alignment. No evidence of loosening of prosthesis. Spurring of patella. Small joint effusion.   Electronically Signed   By: Lahoma Crocker M.D.   On: 04/19/2014 16:05   Dg Knee Right Port  04/20/2014   CLINICAL DATA:   IMPRESSION: Status post removal of prosthesis and debridement of knee joint with spacer placement.   Electronically Signed   By: Inez Catalina M.D.   On: 04/20/2014 17:02   Medications: I have reviewed the patient's current medications. Scheduled Meds: . antiseptic oral rinse  7 mL Mouth Rinse BID  . atorvastatin  5 mg Oral Daily  . busPIRone  15 mg Oral BID  .  carvedilol  3.125 mg Oral BID WC  . Chlorhexidine Gluconate Cloth  6 each Topical Q0600  . clopidogrel  75 mg Oral Daily  . docusate sodium  100 mg Oral BID  . famotidine  20 mg Oral BID  . feeding supplement (ENSURE COMPLETE)  237 mL Oral BID BM  . insulin aspart  0-15 Units Subcutaneous TID WC  . mupirocin ointment  1 application Nasal BID  . piperacillin-tazobactam (ZOSYN)  IV  3.375 g Intravenous 3 times per day  . tamsulosin  0.4 mg Oral Daily   Continuous Infusions: . dextrose 5 % and 0.45 % NaCl with KCl 20 mEq/L 125 mL/hr at 04/20/14 2044  . lactated ringers 50 mL/hr at 04/20/14 1246   Assessment/Plan: Principal Problem:   Septic joint of right knee joint Active Problems:   Hyperlipidemia   Hypertension   Anxiety   History of MI (myocardial infarction)   UTI (lower urinary tract infection)    History of DVT (deep vein thrombosis)   Diabetes mellitus without complication   Chronic kidney disease (CKD), stage III (moderate)   Infection of total right knee replacement  Randy [name] is an 81 yo man with a history of hypertension, hyperlipidemia, DM2, MI and CKD who had a right knee total arthroplasty in 2010 and presented on 3/3 with septic arthritis of the right knee.   Septic Right Knee Joint:1 Day Post-Op Procedure(s) (LRB): IRRIGATION AND DEBRIDMENT RIGHT TOTAL KNEE REMOVE ACL IMPLANTED AND PLACE SPACE. Developed at site of hardware nidus in context of recent sepsis (which was treated with IV abx).  - Continue zosyn to cover e coli (previous agent in urosepsis) - Follow up cell count, culture, gram stain from knee effusion  - Appreciate PT/OT consult  Chronic Anticoagulation: Warfarin held. INF today- 2.7 -No anticoag for now, No DVT ppx for now.  DM2: Last A1c 6.9% in 02/2013. Fasting blood sugars- 197. On NPH at home- 14 u bid - SSI-M - Restart home meds- at 10u BID  CAD: On home plavix. - Held Plavix 75 mg daily.  CKD Stage III: Creatinine at baseline (1.6). - Bmet in the am.  BPH: Continue home Flomax.  - Continue Flomax 0.4 mg daily  Hyperlipidemia: On home atorvastatin - Continue atorvastatin 5 mg daily  Hypertension: On home coreg  - Continue coreg 3.125 mg BID  GERD: On daily pepcid. - Continue pepcid 20 mg BID  Anxiety: On BuSpar at home. - Continue BuSpar 15 mg BID  Diet: Carb mord  DVT Ppx: INR 2.7.  Code Status: Full  -Defer to caregivers Teonte [[id number]] and Dyann Ruddle [[phone]] if patient lacks decision-making capacity -Confirmed with patient on admission  Dispo: Disposition is deferred at this time, awaiting improvement of current medical problems.  Anticipated discharge in approximately 2 day(s).    LOS: 2 days   [name], MD 04/21/2014, 12:36 PM

## 2014-04-21 NOTE — Progress Notes (Signed)
Subjective: 1 Day Post-Op Procedure(s) (LRB): IRRIGATION AND DEBRIDMENT RIGHT TOTAL KNEE REMOVE  ACL IMPLANTED AND PLACE SPACER (Right)   Patient resting comfortably in bed. He knows that he had surgery on his knee and he can tell us his name. When asked where he is he stated [facility]. He denies any significant pain at this time.   Activity level:  Partial weight bearing right leg in immobilizer Diet tolerance:  Eating well Voiding:  ok Patient reports pain as mild.    Objective: Vital signs in last 24 hours: Temp:  [97.3 F (36.3 C)-98.5 F (36.9 C)] 98.5 F (36.9 C) (03/05 0500) Pulse Rate:  [70-76] 76 (03/05 0500) Resp:  [18-29] 18 (03/05 0400) BP: (95-144)/(38-70) 117/38 mmHg (03/05 0500) SpO2:  [97 %-100 %] 98 % (03/05 0500) Weight:  [95.255 kg (210 lb)] 95.255 kg (210 lb) (03/05 0500)  Labs:  Recent Labs  04/19/14 1530 04/20/14 0633 04/21/14 0405  HGB 13.2 11.7* 10.6*    Recent Labs  04/20/14 0633 04/21/14 0405  WBC 7.5 8.5  RBC 4.40 3.92*  HCT 36.9* 32.9*  PLT 464* 436*    Recent Labs  04/20/14 0633 04/21/14 0405  NA 139 135  K 4.1 4.5  CL 106 103  CO2 23 25  BUN 14 14  CREATININE 1.48* 1.61*  GLUCOSE 118* 202*  CALCIUM 8.7 8.1*    Recent Labs  04/20/14 0633 04/21/14 0405  INR 2.45* 2.72*    Physical Exam:  Neurologically intact ABD soft Neurovascular intact Sensation intact distally Intact pulses distally Dorsiflexion/Plantar flexion intact Incision: dressing C/D/I No cellulitis present Compartment soft  Assessment/Plan:  1 Day Post-Op Procedure(s) (LRB): IRRIGATION AND DEBRIDMENT RIGHT TOTAL KNEE REMOVE  ACL IMPLANTED AND PLACE SPACER (Right) Advance diet Up with therapy  Continue drain. Continue IV abx. Continue current pain meds.  Appreciate medical management.  Patient will continue here through the weekend.    Randy [name], Randy [name] 04/21/2014, 7:47 AM

## 2014-04-21 NOTE — Evaluation (Signed)
Occupational Therapy Evaluation Patient Details Name: Randy [name] MRN: [id number] DOB: Jul 13, 1924 Today's Date: 04/21/2014    History of Present Illness Pt is an 81 y.o. Male s/p I&D of Right Total Knee Removal of ACL Implant and placement of antibiotic spacer on 04/20/14 due to infection. 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    Clinical Impression   PTA pt lived at home and was minimally ambulatory with use of RW and assistance from PCA. Pt had assistance for ADLs. Feel that pt would benefit from SNF at d/c due to decreased mobility and strength. Pt will benefit from acute OT to address LB ADLs, EOB sitting, and functional transfers.     Follow Up Recommendations  SNF;Supervision/Assistance - 24 hour    Equipment Recommendations  Other (comment) (Defer to SNF)    Recommendations for Other Services       Precautions / Restrictions Precautions Precautions: Fall;Knee Precaution Comments: educated pt on knee precautions and use of KI at all times Required Braces or Orthoses: Knee Immobilizer - Right Knee Immobilizer - Right: On at all times Restrictions Weight Bearing Restrictions: Yes [name]: Partial weight bearing RLE Partial Weight Bearing Percentage or Pounds: 50      Mobility Bed Mobility Overal bed mobility: Needs Assistance Bed Mobility: Supine to Sit;Sit to Supine     Supine to sit: HOB elevated;Max assist Sit to supine: Mod assist   General bed mobility comments: Mod A to rotate hips and progress LEs to EOB. HOB elevated and heavy use of bed rails with A to pull to sitting. Propped pt's leg on therapist's leg when sitting EOB and pt with fair balance.   Transfers                 General transfer comment: Not assessed at this time due to pain and safety. Pt will likely need +2 to stand. Pt was able to use Bil UEs and LLE to push up to scoot himself up the side of the bed.          ADL Overall ADL's :  Needs assistance/impaired Eating/Feeding: Independent;Sitting   Grooming: Set up;Sitting   Upper Body Bathing: Set up;Sitting   Lower Body Bathing: Total assistance;Sitting/lateral leans   Upper Body Dressing : Set up;Sitting   Lower Body Dressing: Total assistance;Sitting/lateral leans                 General ADL Comments: Pt able to get EOB despite pain and stating "I can't do it." very slow with progression of movement due to pain. Once EOB, pt was able to use Bil UEs and LLE to boost self up to scoot himself up the EOB and scoot his hips back. Pt's bed pads were soaking (pt had condom cath, but likely already off). Notified RN. Performed rolling in bed to place new bed pads and change pt's gown.                Pertinent Vitals/Pain Pain Assessment: 0-10 Pain Score: 9  Pain Location: RLE Pain Descriptors / Indicators: Sharp;Constant;Moaning ("twisting in my knee") Pain Intervention(s): Limited activity within patient's tolerance;Monitored during session;Repositioned;Ice applied        Extremity/Trunk Assessment Upper Extremity Assessment Upper Extremity Assessment: Generalized weakness   Lower Extremity Assessment Lower Extremity Assessment: Defer to PT evaluation   Cervical / Trunk Assessment Cervical / Trunk Assessment: Normal   Communication Communication Communication: [facility]  Cognition Arousal/Alertness: Awake/alert Behavior During Therapy: WFL for tasks assessed/performed Overall Cognitive Status: Impaired/Different from baseline Area of Impairment: Orientation Orientation Level: Disoriented to;Place;Time (Pt initially unable to state DOB)   Memory: Decreased recall of precautions;Decreased short-term memory         General Comments: Pt at times appears disoriented.               Home Living Family/patient expects to be discharged to:: Private residence Living Arrangements: Children Available Help at Discharge: Family;Personal care  attendant;Available 24 hours/day Type of Home: House Home Access: Ramped entrance     Home Layout: One level               Home Equipment: Walker - 2 wheels;Shower seat;Grab bars - tub/shower   Additional Comments: pt lives with daughter who has MS and is bed ridden. They have 4 rotating caregivers and 24/7 supervision.       Prior Functioning/Environment Level of Independence: Needs assistance  Gait / Transfers Assistance Needed: pt with limited mobility using RW at home and assistance of caretaker. He mostly sits in a recliner during the day and gets to the bathroom, but otherwise no mobility until he returns to bed with help from the PCA.  ADL's / Homemaking Assistance Needed: Pt needs assist and has PCA at all times. They help with majority of bathing/dressing.    Comments: poor historian.    OT Diagnosis: Generalized weakness;Acute pain   OT Problem List: Decreased strength;Decreased range of motion;Impaired balance (sitting and/or standing);Decreased activity tolerance;Decreased cognition;Decreased safety awareness;Decreased knowledge of use of DME or AE;Decreased knowledge of precautions;Pain   OT Treatment/Interventions: Self-care/ADL training;Therapeutic exercise;Energy conservation;DME and/or AE instruction;Therapeutic activities;Patient/family education;Balance training    OT Goals(Current goals can be found in the care plan section) Acute Rehab OT Goals Patient Stated Goal: to stay 2-3 days and go home OT Goal Formulation: With patient Time For Goal Achievement: 05/05/14 Potential to Achieve Goals: Good ADL Goals Pt Will Perform Lower Body Bathing: with mod assist;sit to/from stand Pt Will Perform Lower Body Dressing: with mod assist;sit to/from stand Pt Will Transfer to Toilet: with mod assist;stand pivot transfer;bedside commode Pt Will Perform Toileting - Clothing Manipulation and hygiene: with mod assist;sit to/from stand  OT Frequency: Min 1X/week    End  of Session Equipment Utilized During Treatment: Right knee immobilizer Nurse Communication: Other (comment) (pt's condom cath off)  Activity Tolerance: Patient limited by pain;Patient tolerated treatment well Patient left: in bed;with call bell/phone within reach;with bed alarm set;with family/visitor present;with SCD's reapplied   Time: 9179-1505 OT Time Calculation (min): 50 min Charges:  OT General Charges $OT Visit: 1 Procedure OT Evaluation $Initial OT Evaluation Tier I: 1 Procedure OT Treatments $Self Care/Home Management : 8-22 mins $Therapeutic Activity: 8-22 mins G-Codes:    Villa Herb M 2014-04-22, 3:33 PM  Secundino Ginger Lynetta Mare, OTR/L Occupational Therapist [phone] (pager)

## 2014-04-21 NOTE — Progress Notes (Signed)
PT Cancellation Note  Patient Details Name: [name] MRN: [id number] DOB: [birth year]   Cancelled Treatment:    Reason Eval/Treat Not Completed: Patient declined, no reason specified Patient recently completed OT treatment, complaint of pain and fatigue.  [name], [name] L 04/21/2014, 4:59 PM

## 2014-04-22 LAB — CBC
HCT: 28.6 % — ABNORMAL LOW (ref 39.0–52.0)
HEMOGLOBIN: 9.1 g/dL — AB (ref 13.0–17.0)
MCH: 26.8 pg (ref 26.0–34.0)
MCHC: 31.8 g/dL (ref 30.0–36.0)
MCV: 84.4 fL (ref 78.0–100.0)
Platelets: 421 10*3/uL — ABNORMAL HIGH (ref 150–400)
RBC: 3.39 MIL/uL — AB (ref 4.22–5.81)
RDW: 16.5 % — AB (ref 11.5–15.5)
WBC: 8.9 10*3/uL (ref 4.0–10.5)

## 2014-04-22 LAB — BASIC METABOLIC PANEL
Anion gap: 4 — ABNORMAL LOW (ref 5–15)
BUN: 15 mg/dL (ref 6–23)
CO2: 28 mmol/L (ref 19–32)
Calcium: 8 mg/dL — ABNORMAL LOW (ref 8.4–10.5)
Chloride: 101 mmol/L (ref 96–112)
Creatinine, Ser: 1.57 mg/dL — ABNORMAL HIGH (ref 0.50–1.35)
GFR calc Af Amer: 44 mL/min — ABNORMAL LOW (ref >90)
GFR calc non Af Amer: 38 mL/min — ABNORMAL LOW (ref >90)
Glucose, Bld: 169 mg/dL — ABNORMAL HIGH (ref 70–99)
Potassium: 4.2 mmol/L (ref 3.5–5.1)
Sodium: 133 mmol/L — ABNORMAL LOW (ref 135–145)

## 2014-04-22 LAB — URINE CULTURE

## 2014-04-22 LAB — GLUCOSE, CAPILLARY
GLUCOSE-CAPILLARY: 245 mg/dL — AB (ref 70–99)
GLUCOSE-CAPILLARY: 131 mg/dL — AB (ref 70–99)
Glucose-Capillary: 164 mg/dL — ABNORMAL HIGH (ref 70–99)
Glucose-Capillary: 231 mg/dL — ABNORMAL HIGH (ref 70–99)
Glucose-Capillary: 168 mg/dL — ABNORMAL HIGH (ref 70–99)

## 2014-04-22 LAB — PROTIME-INR
INR: 2.84 — ABNORMAL HIGH (ref 0.00–1.49)
PROTHROMBIN TIME: 30 s — AB (ref 11.6–15.2)

## 2014-04-22 MED ORDER — INSULIN NPH (HUMAN) (ISOPHANE) 100 UNIT/ML ~~LOC~~ SUSP
14.0000 [IU] | Freq: Two times a day (BID) | SUBCUTANEOUS | Status: DC
  Administered 2014-04-22 – 2014-04-23 (×3): 14 [IU] via SUBCUTANEOUS
  Filled 2014-04-22: qty 10

## 2014-04-22 MED ORDER — OXYCODONE HCL 5 MG PO TABS
5.0000 mg | ORAL_TABLET | Freq: Four times a day (QID) | ORAL | Status: DC | PRN
  Administered 2014-04-23: 10 mg via ORAL
  Filled 2014-04-22: qty 1
  Filled 2014-04-22: qty 2

## 2014-04-22 NOTE — Progress Notes (Signed)
ANTIBIOTIC CONSULT NOTE - FOLLOW UP  Pharmacy Consult for Zosyn Indication: infected R total knee  No Known Allergies  Patient Measurements: Weight: 210 lb (95.255 kg)  Vital Signs: Temp: 98 F (36.7 C) (03/06 0435) Temp Source: Oral (03/06 0435) BP: 128/52 mmHg (03/06 0435) Pulse Rate: 72 (03/06 0435) Intake/Output from previous day: 03/05 0701 - 03/06 0700 In: 360 [P.O.:360] Out: 550 [Urine:450; Drains:100] Intake/Output from this shift:    Labs:  Recent Labs  04/20/14 0633 04/21/14 0405 04/22/14 0458  WBC 7.5 8.5 8.9  HGB 11.7* 10.6* 9.1*  PLT 464* 436* 421*  CREATININE 1.48* 1.61* 1.57*   Estimated Creatinine Clearance: 36.5 mL/min (by C-G formula based on Cr of 1.57).    Microbiology: Recent Results (from the past 720 hour(s))  Blood culture (routine x 2)     Status: None (Preliminary result)   Collection Time: 04/19/14  3:30 PM  Result Value Ref Range Status   Specimen Description BLOOD LEFT FOREARM  Final   Special Requests BOTTLES DRAWN AEROBIC AND ANAEROBIC 2CC  Final   Culture   Final           BLOOD CULTURE RECEIVED NO GROWTH TO DATE CULTURE WILL BE HELD FOR 5 DAYS BEFORE ISSUING A FINAL NEGATIVE REPORT Performed at [facility]    Report Status PENDING  Incomplete  Blood culture (routine x 2)     Status: None (Preliminary result)   Collection Time: 04/19/14  4:52 PM  Result Value Ref Range Status   Specimen Description BLOOD ARM RIGHT  Final   Special Requests BOTTLES DRAWN AEROBIC AND ANAEROBIC 10CC  Final   Culture   Final           BLOOD CULTURE RECEIVED NO GROWTH TO DATE CULTURE WILL BE HELD FOR 5 DAYS BEFORE ISSUING A FINAL NEGATIVE REPORT Performed at [facility]    Report Status PENDING  Incomplete  MRSA PCR Screening     Status: Abnormal   Collection Time: 04/20/14  1:21 AM  Result Value Ref Range Status   MRSA by PCR POSITIVE (A) NEGATIVE Final    Comment:        The GeneXpert MRSA Assay (FDA approved for NASAL  specimens only), is one component of a comprehensive MRSA colonization surveillance program. It is not intended to diagnose MRSA infection nor to guide or monitor treatment for MRSA infections. RESULT CALLED TO, READ BACK BY AND VERIFIED WITH: CALLED TO RN Alfonso Ellis [phone] @[phone]  THANEY   Urine culture     Status: None   Collection Time: 04/20/14  8:55 PM  Result Value Ref Range Status   Specimen Description URINE, CLEAN CATCH  Final   Special Requests NONE  Final   Colony Count   Final    50,000 COLONIES/ML Performed at [facility]    Culture   Final    Multiple bacterial morphotypes present, none predominant. Suggest appropriate recollection if clinically indicated. Performed at [facility]    Report Status 04/22/2014 FINAL  Final    Anti-infectives    Start     Dose/Rate Route Frequency Ordered Stop   04/20/14 1340  vancomycin (VANCOCIN) powder  Status:  Discontinued       As needed 04/20/14 1341 04/20/14 1528   04/20/14 1339  tobramycin (NEBCIN) powder  Status:  Discontinued       As needed 04/20/14 1340 04/20/14 1528   04/20/14 0000  piperacillin-tazobactam (ZOSYN) IVPB 3.375 g  3.375 g 12.5 mL/hr over 240 Minutes Intravenous 3 times per day 04/19/14 1804     04/19/14 1530  piperacillin-tazobactam (ZOSYN) IVPB 3.375 g     3.375 g 100 mL/hr over 30 Minutes Intravenous  Once 04/19/14 1515 04/19/14 1650   04/19/14 1515  vancomycin (VANCOCIN) IVPB 1000 mg/200 mL premix     1,000 mg 200 mL/hr over 60 Minutes Intravenous  Once 04/19/14 1506 04/19/14 1828      Assessment: 81 yo M continues on Zosyn for empiric coverage of R knee infection.  Pt with a recent Ecoli UTI that was sensitive to Zosyn.  Awaiting outpt cx data to determine final abx choice.  Renal function is stable.  Pt is afebrile and WBC is trending down.  Goal of Therapy:  Eradication of Infection  Plan:  Continue Zosyn 3.375 gm IV q8h (4 hour infusion). Follow-up culture data  and clinical progress.  **Follow up restart of Coumadin for hx DVTs   [employer], Pharm.D., BCPS Clinical Pharmacist Pager [phone] 04/22/2014 2:50 PM

## 2014-04-22 NOTE — Progress Notes (Signed)
Subjective: 2 Days Post-Op Procedure(s) (LRB): IRRIGATION AND DEBRIDMENT RIGHT TOTAL KNEE REMOVE  ACL IMPLANTED AND PLACE SPACER (Right)  Activity level:  Partial weight bearing right leg in immobilizer Diet tolerance:  ok Voiding:  ok Patient reports pain as moderate.    Objective: Vital signs in last 24 hours: Temp:  [98 F (36.7 C)-98.6 F (37 C)] 98 F (36.7 C) (03/06 0435) Pulse Rate:  [72-76] 72 (03/06 0435) Resp:  [18] 18 (03/06 0435) BP: (120-134)/(42-52) 128/52 mmHg (03/06 0435) SpO2:  [97 %-100 %] 100 % (03/06 0435)  Labs:  Recent Labs  04/19/14 1530 04/20/14 0633 04/21/14 0405 04/22/14 0458  HGB 13.2 11.7* 10.6* 9.1*    Recent Labs  04/21/14 0405 04/22/14 0458  WBC 8.5 8.9  RBC 3.92* 3.39*  HCT 32.9* 28.6*  PLT 436* 421*    Recent Labs  04/21/14 0405 04/22/14 0458  NA 135 133*  K 4.5 4.2  CL 103 101  CO2 25 28  BUN 14 15  CREATININE 1.61* 1.57*  GLUCOSE 202* 169*  CALCIUM 8.1* 8.0*    Recent Labs  04/21/14 0405 04/22/14 0458  INR 2.72* 2.84*    Physical Exam:  Neurologically intact ABD soft Neurovascular intact Sensation intact distally Intact pulses distally Dorsiflexion/Plantar flexion intact Incision: dressing C/D/I No cellulitis present Compartment soft  Assessment/Plan:  2 Days Post-Op Procedure(s) (LRB): IRRIGATION AND DEBRIDMENT RIGHT TOTAL KNEE REMOVE  ACL IMPLANTED AND PLACE SPACER (Right) Advance diet Up with therapy Continue drain. Continue IV abx. Continue current pain meds.  Appreciate medical management.  Patient will continue here through the weekend.   Shonnie Poudrier, Larwance Sachs 04/22/2014, 10:32 AM

## 2014-04-22 NOTE — Evaluation (Signed)
Physical Therapy Evaluation Patient Details Name: Randy [name] MRN: [id number] DOB: 1925/02/15 Today's Date: 04/22/2014   History of Present Illness  Pt is an 81 y.o. Male s/p I&D of Right Total Knee Removal of ACL Implant and placement of antibiotic spacer on 04/20/14 due to infection. 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   Clinical Impression   Patient is s/p above surgery resulting in functional limitations due to the deficits listed below (see PT Problem List).  Patient will benefit from skilled PT to increase their independence and safety with mobility to allow discharge to the venue listed below.       Follow Up Recommendations SNF;Supervision/Assistance - 24 hour    Equipment Recommendations  Rolling walker with 5" wheels;3in1 (PT)    Recommendations for Other Services       Precautions / Restrictions Precautions Precautions: Fall;Knee Precaution Comments: educated pt on knee precautions and use of KI at all times Required Braces or Orthoses: Knee Immobilizer - Right Knee Immobilizer - Right: On at all times Restrictions Weight Bearing Restrictions: Yes [name]: Partial weight bearing RLE Partial Weight Bearing Percentage or Pounds: 50      Mobility  Bed Mobility Overal bed mobility: Needs Assistance Bed Mobility: Supine to Sit     Supine to sit: HOB elevated;Max assist     General bed mobility comments: Mod A to rotate hips and progress LEs to EOB. HOB elevated and heavy use of bed rails with A to pull to sitting. Propped pt's leg on therapist's leg when sitting EOB until able to slide hips forward to allow R foot to reach floor; and pt with fair balance, and cues for optimal hand placement  Transfers Overall transfer level: Needs assistance Equipment used: Rolling walker (2 wheeled);2 person hand held assist Transfers: Sit to/from [facility] Sit to Stand: +2 physical assistance;Max assist Stand  pivot transfers: +2 physical assistance;Max assist       General transfer comment: Difficulty clearing hip from the bed with intial sit to stand trial using RW; performed basic stand pivot transfer towards stronger, L side with +2 assist and L knee blocked  Ambulation/Gait                Stairs            Wheelchair Mobility    Modified Rankin (Stroke Patients Only)       Balance Overall balance assessment: Needs assistance Sitting-balance support: Bilateral upper extremity supported Sitting balance-Leahy Scale: Fair                                       Pertinent Vitals/Pain Pain Assessment: 0-10 Pain Score: 8  Pain Location: RLE Pain Descriptors / Indicators: Burning Pain Intervention(s): Limited activity within patient's tolerance;Monitored during session (notified PA)    Home Living Family/patient expects to be discharged to:: Private residence Living Arrangements: Children Available Help at Discharge: Family;Personal care attendant;Available 24 hours/day Type of Home: House Home Access: Ramped entrance     Home Layout: One level Home Equipment: Walker - 2 wheels;Shower seat;Grab bars - tub/shower Additional Comments: pt lives with daughter who has MS and is bed ridden. They have 4 rotating caregivers and 24/7 supervision.     Prior Function Level of Independence: Needs assistance   Gait / Transfers Assistance Needed: pt with limited  mobility using RW at home and assistance of caretaker. He mostly sits in a recliner during the day and gets to the bathroom, but otherwise no mobility until he returns to bed with help from the PCA.   ADL's / Homemaking Assistance Needed: Pt needs assist and has PCA at all times. They help with majority of bathing/dressing.   Comments: poor historian.     Hand Dominance        Extremity/Trunk Assessment   Upper Extremity Assessment: Generalized weakness           Lower Extremity Assessment:  RLE deficits/detail RLE Deficits / Details: Decr AROM and strength, limited by pain; KI ordered for at all times    Cervical / Trunk Assessment: Normal  Communication   Communication: HOH  Cognition Arousal/Alertness: Awake/alert Behavior During Therapy: WFL for tasks assessed/performed Overall Cognitive Status: Impaired/Different from baseline Area of Impairment: Orientation     Memory: Decreased recall of precautions;Decreased short-term memory         General Comments: Pt at times appears disoriented.     General Comments      Exercises        Assessment/Plan    PT Assessment Patient needs continued PT services  PT Diagnosis Difficulty walking;Abnormality of gait;Generalized weakness;Acute pain   PT Problem List Decreased strength;Decreased range of motion;Decreased activity tolerance;Decreased balance;Decreased mobility;Decreased coordination;Decreased cognition;Decreased knowledge of use of DME;Decreased safety awareness;Decreased knowledge of precautions;Pain  PT Treatment Interventions DME instruction;Gait training;Functional mobility training;Therapeutic activities;Therapeutic exercise;Balance training;Patient/family education   PT Goals (Current goals can be found in the Care Plan section) Acute Rehab PT Goals Patient Stated Goal: did not state PT Goal Formulation: With patient Time For Goal Achievement: 05/06/14 Potential to Achieve Goals: Good    Frequency Min 3X/week   Barriers to discharge        Co-evaluation               End of Session Equipment Utilized During Treatment: Gait belt;Right knee immobilizer Activity Tolerance: Patient limited by pain Patient left: in chair;with call bell/phone within reach Nurse Communication: Mobility status;Patient requests pain meds         Time: 0957-1021 PT Time Calculation (min) (ACUTE ONLY): 24 min   Charges:   PT Evaluation $Initial PT Evaluation Tier I: 1 Procedure PT  Treatments $Therapeutic Activity: 8-22 mins   PT G Codes:        Roney Marion Hamff 04/22/2014, 11:38 AM  Roney Marion, PT  Acute Rehabilitation Services Pager [phone] Office [phone]

## 2014-04-22 NOTE — Clinical Social Work Note (Signed)
CSW received consult for SNF placement. CSW met with patient in room with caregiver. Patient preferred CSW speak with his daughter Caren Griffins. CSW spoke with daughter Caren Griffins, who states she would prefer CSW speak with brother [name] regarding d/c plan. However, Caren Griffins states patient has been residing with her for approximately one year. Cyrnthia states patient has been in SNF in [place]. CSW attempted to contact [name] [phone]) and left a message for follow-up. CSW to follow with assessment tomorrow.  [place], [place] Weekend Clinical Social Worker [phone]

## 2014-04-22 NOTE — Progress Notes (Signed)
Subjective: Eating lunch. No complaints. Sitting in recliner at bedside. Wanted to know when he will be going home.   Objective: Vital signs in last 24 hours: Filed Vitals:   04/21/14 1300 04/21/14 1720 04/21/14 2247 04/22/14 0435  BP: 120/48 134/42 124/48 128/52  Pulse: 72  76 72  Temp: 98.6 F (37 C)  98.6 F (37 C) 98 F (36.7 C)  TempSrc: Oral  Oral Oral  Resp: 18  18 18   Weight:      SpO2: 97%  100% 100%   Weight change:   Intake/Output Summary (Last 24 hours) at 04/22/14 1248 Last data filed at 04/22/14 0435  Gross per 24 hour  Intake    240 ml  Output    550 ml  Net   -310 ml   Physical Exam: General: NAD, eating lunch,stable vitals HEENT: AT, [place], EOMI. Cardiac: RRR, no added sounds appreciated. Pulm: clear to auscultation bilaterally, no wheezes, rales, or rhonchi Abd: soft, nontender, nondistended, BS present Ext: Brace extending from thigh to ankle, right foot slightly swollen, compression ace bandage present Neuro- Alert, moving extremities voluntarily  Lab Results: Basic Metabolic Panel:  Recent Labs Lab 04/20/14 0633 04/21/14 0405 04/22/14 0458  NA 139 135 133*  K 4.1 4.5 4.2  CL 106 103 101  CO2 23 25 28   GLUCOSE 118* 202* 169*  BUN 14 14 15   CREATININE 1.48* 1.61* 1.57*  CALCIUM 8.7 8.1* 8.0*  MG 2.0  --   --   PHOS 3.1  --   --    Liver Function Tests:  Recent Labs Lab 04/19/14 1652  AST 25  ALT 16  ALKPHOS 69  BILITOT 0.4  PROT 7.3  ALBUMIN 2.9*   CBC:  Recent Labs Lab 04/19/14 1530  04/21/14 0405 04/22/14 0458  WBC 9.1  < > 8.5 8.9  NEUTROABS 6.4  --   --   --   HGB 13.2  < > 10.6* 9.1*  HCT 40.7  < > 32.9* 28.6*  MCV 83.9  < > 83.9 84.4  PLT 490*  < > 436* 421*  < > = values in this interval not displayed. CBG:  Recent Labs Lab 04/21/14 1225 04/21/14 1651 04/21/14 2248 04/22/14 0619 04/22/14 1105 04/22/14 1224  GLUCAP 190* 196* 170* 164* 245* 231*   Coagulation:  Recent Labs Lab 04/19/14 1652  04/20/14 0633 04/21/14 0405 04/22/14 0458  LABPROT 27.4* 26.8* 29.1* 30.0*  INR 2.52* 2.45* 2.72* 2.84*   Urinalysis:  Recent Labs Lab 04/19/14 1530  COLORURINE YELLOW  LABSPEC 1.016  PHURINE 5.5  GLUCOSEU NEGATIVE  HGBUR MODERATE*  BILIRUBINUR NEGATIVE  KETONESUR NEGATIVE  PROTEINUR 30*  UROBILINOGEN 0.2  NITRITE NEGATIVE  LEUKOCYTESUR LARGE*   Studies/Results: Dg Chest 2 View  04/19/2014   CLINICAL DATA:  IMPRESSION: No active disease.  Dual lead cardiac pacemaker in place.   Electronically Signed   By: Lahoma Crocker M.D.   On: 04/19/2014 16:10   Dg Knee Complete 4 Views Right  04/19/2014   CLINICAL DATA:   IMPRESSION: No acute fracture or subluxation. Right knee prosthesis in anatomic alignment. No evidence of loosening of prosthesis. Spurring of patella. Small joint effusion.   Electronically Signed   By: Lahoma Crocker M.D.   On: 04/19/2014 16:05   Dg Knee Right Port  04/20/2014   CLINICAL DATA:   IMPRESSION: Status post removal of prosthesis and debridement of knee joint with spacer placement.   Electronically Signed   By: Elta Guadeloupe  Lukens M.D.   On: 04/20/2014 17:02   Medications: I have reviewed the patient's current medications. Scheduled Meds: . antiseptic oral rinse  7 mL Mouth Rinse BID  . atorvastatin  5 mg Oral Daily  . busPIRone  15 mg Oral BID  . carvedilol  3.125 mg Oral BID WC  . Chlorhexidine Gluconate Cloth  6 each Topical Q0600  . clopidogrel  75 mg Oral Daily  . docusate sodium  100 mg Oral BID  . famotidine  20 mg Oral BID  . feeding supplement (ENSURE COMPLETE)  237 mL Oral BID BM  . insulin aspart  0-15 Units Subcutaneous TID WC  . insulin NPH Human  10 Units Subcutaneous BID AC & HS  . mupirocin ointment  1 application Nasal BID  . piperacillin-tazobactam (ZOSYN)  IV  3.375 g Intravenous 3 times per day  . tamsulosin  0.4 mg Oral Daily   Continuous Infusions: . dextrose 5 % and 0.45 % NaCl with KCl 20 mEq/L 10 mL/hr at 04/21/14 1357  . lactated ringers  50 mL/hr at 04/20/14 1246   Assessment/Plan:  Randy [name] is an 81 yo man with a history of hypertension, hyperlipidemia, DM2, MI and CKD who had a right knee total arthroplasty in 2010 and presented on 3/3 with septic arthritis of the right knee.   Septic Right Knee Joint ( prosthesis) :1 Day Post-Op Procedure(s) (LRB): IRRIGATION AND DEBRIDMENT RIGHT TOTAL KNEE REMOVE ACL IMPLANTED AND PLACE SPACE. Developed at site of hardware nidus in context of recent sepsis (which was treated with IV abx).  - Continue zosyn to cover e coli (previous agent in urosepsis) - Talk to patient orthopedist about culture results so pt can be switched to PO meds.  - Appreciate PT/OT consult - Discharge tomorrow when bed is available, and when surgery signs off - cont Oxycodone 5-10 at Q6H  Chronic Anticoagulation: Warfarin held. INF today- 2.8 -No anticoag for now, No DVT ppx/tx for now.  DM2: Last A1c 6.9% in 02/2013. Fasting blood sugars- 197. On NPH at home- 14 u bid - SSI-M - increase NPH to 14u BID  CAD: On home plavix. - Held Plavix 75 mg daily.  CKD Stage III: Creatinine at baseline (1.6). - Bmet in the am.  BPH: Continue home Flomax. Is on condom cath.  - Continue Flomax 0.4 mg daily  Hyperlipidemia: On home atorvastatin - Continue atorvastatin 5 mg daily  Hypertension: On home coreg  - Continue coreg 3.125 mg BID  GERD: On daily pepcid. - Continue pepcid 20 mg BID  Anxiety: On BuSpar at home. - Continue BuSpar 15 mg BID  Diet: Carb mord  DVT Ppx: INR 2.8.  Code Status: Full  -Defer to caregivers Teonte [[phone]] and Dyann Ruddle [[phone]] if patient lacks decision-making capacity -Confirmed with patient on admission  Dispo: Disposition is deferred at this time, awaiting improvement of current medical problems.  Anticipated discharge in approximately 1-2 day(s).    LOS: 3 days   [name], MD 04/22/2014, 12:48 PM

## 2014-04-22 NOTE — Clinical Social Work Note (Signed)
CSW received phone call from patient's son [name]. Patient's son requested SNF list to be emailed to him. CSW made patient's son aware cannot send email to him. Patient's son became upset and stated I had already violated his rights as HCPOA by speaking with patient as he was HCPOA. CSW explained to son, met with patient and caregiver in room. Patient requested CSW speak with daughter and daughter made CSW aware patient's son makes "all the decisions about these things." CSW asked son why he was becoming verbally hostile. Son states he still does not understand why CSW couldn't send him an email. CSW re-educated son on why e-mail was not being sent. CSW offered to fax SNF list to son, son declined and states he does not have access to fax. CSW made son aware daughter had list, and son states he lives in [place] and will not drive to her home and pick it up. She offered to provide son with SNF over the phone, son declined. Son states he will not complete assessment with CSW or make choice on any facilities until he receives list. CSW made son aware phone call was to be terminated phone as he had become louder and more hostile during conversation. Son states "that's fine by me." CSW made son aware, weekday CSW would follow-up with him. Patient was agreeable with CSW contacting him. CSW to follow tomorrow.  [place], [place] Weekend Clinical Social Worker [phone]

## 2014-04-22 NOTE — Progress Notes (Signed)
Went to check on patient, found him sitting on the edge of the recliner. Stated he was trying to get up to the bathroom. Call bell was within reach.  IV was disconnected from tubing and approximately 75cc of blood was on the floor. Called nurse techs for assistance. Cleaned everything with bleach wipes. Put patient back in bed with bed alarm on. Reconnected IV with new tubing. VSS.

## 2014-04-23 ENCOUNTER — Inpatient Hospital Stay (HOSPITAL_COMMUNITY): Payer: Medicare Other

## 2014-04-23 ENCOUNTER — Encounter (HOSPITAL_COMMUNITY): Payer: Self-pay | Admitting: Orthopedic Surgery

## 2014-04-23 DIAGNOSIS — Z8619 Personal history of other infectious and parasitic diseases: Secondary | ICD-10-CM | POA: Insufficient documentation

## 2014-04-23 DIAGNOSIS — B9629 Other Escherichia coli [E. coli] as the cause of diseases classified elsewhere: Secondary | ICD-10-CM

## 2014-04-23 DIAGNOSIS — T8453XA Infection and inflammatory reaction due to internal right knee prosthesis, initial encounter: Principal | ICD-10-CM

## 2014-04-23 DIAGNOSIS — Z22322 Carrier or suspected carrier of Methicillin resistant Staphylococcus aureus: Secondary | ICD-10-CM

## 2014-04-23 DIAGNOSIS — Y838 Other surgical procedures as the cause of abnormal reaction of the patient, or of later complication, without mention of misadventure at the time of the procedure: Secondary | ICD-10-CM

## 2014-04-23 LAB — PROTIME-INR
INR: 2.01 — AB (ref 0.00–1.49)
Prothrombin Time: 22.9 seconds — ABNORMAL HIGH (ref 11.6–15.2)

## 2014-04-23 LAB — CBC
HEMATOCRIT: 27.4 % — AB (ref 39.0–52.0)
HEMOGLOBIN: 8.8 g/dL — AB (ref 13.0–17.0)
MCH: 26.9 pg (ref 26.0–34.0)
MCHC: 32.1 g/dL (ref 30.0–36.0)
MCV: 83.8 fL (ref 78.0–100.0)
Platelets: 420 10*3/uL — ABNORMAL HIGH (ref 150–400)
RBC: 3.27 MIL/uL — AB (ref 4.22–5.81)
RDW: 16.3 % — ABNORMAL HIGH (ref 11.5–15.5)
WBC: 8.1 10*3/uL (ref 4.0–10.5)

## 2014-04-23 LAB — GLUCOSE, CAPILLARY
GLUCOSE-CAPILLARY: 240 mg/dL — AB (ref 70–99)
GLUCOSE-CAPILLARY: 230 mg/dL — AB (ref 70–99)
Glucose-Capillary: 112 mg/dL — ABNORMAL HIGH (ref 70–99)
Glucose-Capillary: 140 mg/dL — ABNORMAL HIGH (ref 70–99)

## 2014-04-23 MED ORDER — SODIUM CHLORIDE 0.9 % IV SOLN: 1.0000 g | INTRAVENOUS | Status: DC

## 2014-04-23 MED ORDER — SODIUM CHLORIDE 0.9 % IV SOLN
250.0000 mg | Freq: Four times a day (QID) | INTRAVENOUS | Status: DC
  Filled 2014-04-23: qty 250

## 2014-04-23 MED ORDER — VANCOMYCIN HCL 10 G IV SOLR
1250.0000 mg | INTRAVENOUS | Status: DC
  Administered 2014-04-23 – 2014-04-24 (×2): 1250 mg via INTRAVENOUS
  Filled 2014-04-23 (×2): qty 1250

## 2014-04-23 MED ORDER — SODIUM CHLORIDE 0.9 % IV SOLN
1.0000 g | INTRAVENOUS | Status: DC
  Administered 2014-04-23 – 2014-04-24 (×2): 1 g via INTRAVENOUS
  Filled 2014-04-23 (×2): qty 1

## 2014-04-23 MED ORDER — SODIUM CHLORIDE 0.9 % IJ SOLN
10.0000 mL | INTRAMUSCULAR | Status: DC | PRN
  Administered 2014-04-24: 10 mL
  Filled 2014-04-23: qty 40

## 2014-04-23 MED ORDER — VANCOMYCIN HCL 10 G IV SOLR: 1250.0000 mg | INTRAVENOUS | Status: DC

## 2014-04-23 NOTE — Consult Note (Signed)
[facility]  Total days of antibiotics 5        Day 5 piptazo               Reason for Consult: prosthetic joint infection    Referring Physician: rowan  Principal Problem:   Septic joint of right knee joint Active Problems:   Hyperlipidemia   Hypertension   Anxiety   History of MI (myocardial infarction)   UTI (lower urinary tract infection)   History of DVT (deep vein thrombosis)   Diabetes mellitus without complication   Chronic kidney disease (CKD), stage III (moderate)   Infection of total right knee replacement    HPI: Randy [name] is a 81 y.o. male with DM, HTN, HDL, CAD, dementia, hx of right TKA in 2010, recently admitted for sepsis of urinary source, found to have Ecoli ESBL bacteremia, treated for 14 day course of imipenem. He was seen by Dr. Mayer Camel last week for worsening right knee pain, warmth and swelling. Office based arthrocentesis yielded 73mL purulent blood tinged fluid. Given that his clinical picture had high suspicion for prosthetic joint infection, he was admitted on 3/4 with the intent for excision of TKA and antibiotic spacer implant with intent of leaving spacer, given his immobility. His arthrocentesis has only yielded WBC, no growth, he is on piptazo presently. Blood cx negative. No other specimens sent from OR.  Past Medical History  Diagnosis Date  . Diabetes mellitus without complication   . Anxiety   . MI (myocardial infarction)   . Hypertension   . Hyperlipidemia     Allergies: No Known Allergies  MEDICATIONS: . antiseptic oral rinse  7 mL Mouth Rinse BID  . atorvastatin  5 mg Oral Daily  . busPIRone  15 mg Oral BID  . carvedilol  3.125 mg Oral BID WC  . Chlorhexidine Gluconate Cloth  6 each Topical Q0600  . clopidogrel  75 mg Oral Daily  . docusate sodium  100 mg Oral BID  . famotidine  20 mg Oral BID  . feeding supplement (ENSURE COMPLETE)  237 mL Oral BID BM  . insulin aspart  0-15 Units Subcutaneous TID  WC  . insulin NPH Human  14 Units Subcutaneous BID AC & HS  . mupirocin ointment  1 application Nasal BID  . piperacillin-tazobactam (ZOSYN)  IV  3.375 g Intravenous 3 times per day  . tamsulosin  0.4 mg Oral Daily    History  Substance Use Topics  . Smoking status: Former Research scientist (life sciences)  . Smokeless tobacco: Never Used  . Alcohol Use: No    Family History  Problem Relation Age of Onset  . Heart attack Mother   . Heart attack Father     Review of Systems  Constitutional: Negative for fever, chills, diaphoresis, activity change, appetite change, fatigue and unexpected weight change.  HENT: Negative for congestion, sore throat, rhinorrhea, sneezing, trouble swallowing and sinus pressure.  Eyes: Negative for photophobia and visual disturbance.  Respiratory: Negative for cough, chest tightness, shortness of breath, wheezing and stridor.  Cardiovascular: Negative for chest pain, palpitations and leg swelling.  Gastrointestinal: Negative for nausea, vomiting, abdominal pain, diarrhea, constipation, blood in stool, abdominal distention and anal bleeding.  Genitourinary: Negative for dysuria, hematuria, flank pain and difficulty urinating.  Musculoskeletal: Negative for myalgias, back pain, joint swelling, arthralgias and gait problem.  Skin: Negative for color change, pallor, rash and wound.  Neurological: Negative for dizziness, tremors, weakness and light-headedness.  Hematological: Negative  for adenopathy. Does not bruise/bleed easily.  Psychiatric/Behavioral: Negative for behavioral problems, confusion, sleep disturbance, dysphoric mood, decreased concentration and agitation.     OBJECTIVE: Temp:  [97.4 F (36.3 C)-97.9 F (36.6 C)] 97.4 F (36.3 C) (03/07 0604) Pulse Rate:  [67-70] 67 (03/07 0604) Resp:  [18] 18 (03/07 0604) BP: (130-145)/(48-60) 145/60 mmHg (03/07 0604) SpO2:  [99 %-100 %] 99 % (03/07 0604) Weight:  [200 lb 6 oz (90.89 kg)] 200 lb 6 oz (90.89 kg) (03/07  0500) Physical Exam  Constitutional: He is oriented to person, place. He appears well-developed and well-nourished. No distress.  HENT:  Mouth/Throat: Oropharynx is clear and moist. No oropharyngeal exudate.  Cardiovascular: Normal rate, regular rhythm and normal heart sounds. Exam reveals no gallop and no friction rub.  No murmur heard.  Pulmonary/Chest: Effort normal and breath sounds normal. No respiratory distress. He has no wheezes.  Abdominal: Soft. Bowel sounds are normal. He exhibits no distension. There is no tenderness.  Lymphadenopathy:  no cervical adenopathy.  Ext: right leg wrapped from surgery, accordion drain in place. Neurological: He is alert and oriented to person, place, and time.  Skin: Skin is warm and dry. No rash noted. No erythema.     LABS: Results for orders placed or performed during the hospital encounter of 04/19/14 (from the past 48 hour(s))  Glucose, capillary     Status: Abnormal   Collection Time: 04/21/14 12:25 PM  Result Value Ref Range   Glucose-Capillary 190 (H) 70 - 99 mg/dL  Glucose, capillary     Status: Abnormal   Collection Time: 04/21/14  4:51 PM  Result Value Ref Range   Glucose-Capillary 196 (H) 70 - 99 mg/dL  Glucose, capillary     Status: Abnormal   Collection Time: 04/21/14 10:48 PM  Result Value Ref Range   Glucose-Capillary 170 (H) 70 - 99 mg/dL  Protime-INR     Status: Abnormal   Collection Time: 04/22/14  4:58 AM  Result Value Ref Range   Prothrombin Time 30.0 (H) 11.6 - 15.2 seconds   INR 2.84 (H) 0.00 - 1.49  CBC     Status: Abnormal   Collection Time: 04/22/14  4:58 AM  Result Value Ref Range   WBC 8.9 4.0 - 10.5 K/uL   RBC 3.39 (L) 4.22 - 5.81 MIL/uL   Hemoglobin 9.1 (L) 13.0 - 17.0 g/dL   HCT 28.6 (L) 39.0 - 52.0 %   MCV 84.4 78.0 - 100.0 fL   MCH 26.8 26.0 - 34.0 pg   MCHC 31.8 30.0 - 36.0 g/dL   RDW 16.5 (H) 11.5 - 15.5 %   Platelets 421 (H) 150 - 400 K/uL  Basic metabolic panel     Status: Abnormal    Collection Time: 04/22/14  4:58 AM  Result Value Ref Range   Sodium 133 (L) 135 - 145 mmol/L   Potassium 4.2 3.5 - 5.1 mmol/L   Chloride 101 96 - 112 mmol/L   CO2 28 19 - 32 mmol/L   Glucose, Bld 169 (H) 70 - 99 mg/dL   BUN 15 6 - 23 mg/dL   Creatinine, Ser 1.57 (H) 0.50 - 1.35 mg/dL   Calcium 8.0 (L) 8.4 - 10.5 mg/dL   GFR calc non Af Amer 38 (L) >90 mL/min   GFR calc Af Amer 44 (L) >90 mL/min    Comment: (NOTE) The eGFR has been calculated using the CKD EPI equation. This calculation has not been validated in all clinical situations. eGFR's persistently <90  mL/min signify possible Chronic Kidney Disease.    Anion gap 4 (L) 5 - 15  Glucose, capillary     Status: Abnormal   Collection Time: 04/22/14  6:19 AM  Result Value Ref Range   Glucose-Capillary 164 (H) 70 - 99 mg/dL  Glucose, capillary     Status: Abnormal   Collection Time: 04/22/14 11:05 AM  Result Value Ref Range   Glucose-Capillary 245 (H) 70 - 99 mg/dL  Glucose, capillary     Status: Abnormal   Collection Time: 04/22/14 12:24 PM  Result Value Ref Range   Glucose-Capillary 231 (H) 70 - 99 mg/dL  Glucose, capillary     Status: Abnormal   Collection Time: 04/22/14  4:41 PM  Result Value Ref Range   Glucose-Capillary 131 (H) 70 - 99 mg/dL  Glucose, capillary     Status: Abnormal   Collection Time: 04/22/14  9:32 PM  Result Value Ref Range   Glucose-Capillary 168 (H) 70 - 99 mg/dL  Glucose, capillary     Status: Abnormal   Collection Time: 04/23/14  6:02 AM  Result Value Ref Range   Glucose-Capillary 240 (H) 70 - 99 mg/dL  Protime-INR     Status: Abnormal   Collection Time: 04/23/14  6:35 AM  Result Value Ref Range   Prothrombin Time 22.9 (H) 11.6 - 15.2 seconds   INR 2.01 (H) 0.00 - 1.49  CBC     Status: Abnormal   Collection Time: 04/23/14  6:35 AM  Result Value Ref Range   WBC 8.1 4.0 - 10.5 K/uL   RBC 3.27 (L) 4.22 - 5.81 MIL/uL   Hemoglobin 8.8 (L) 13.0 - 17.0 g/dL   HCT 27.4 (L) 39.0 - 52.0 %   MCV  83.8 78.0 - 100.0 fL   MCH 26.9 26.0 - 34.0 pg   MCHC 32.1 30.0 - 36.0 g/dL   RDW 16.3 (H) 11.5 - 15.5 %   Platelets 420 (H) 150 - 400 K/uL  Glucose, capillary     Status: Abnormal   Collection Time: 04/23/14 11:38 AM  Result Value Ref Range   Glucose-Capillary 112 (H) 70 - 99 mg/dL   Comment 1 Notify RN     MICRO: 3/4 blood cx ngtd 3/4 urine cx ngtd 3/4 aspirate, synovial fluid ngtd  Assessment/Plan:  45y M with right prosthetic joint infection s/p resection with antibiotic spacer. Recently had esbl ecoli bacteremia  - recommend to treat for 6 wk with imipenem during hospitalization, and can switch to ertapenem once a day dosing as outpatient - we will also add vancomycin to cover for MRSA since he is colonized, thus a total of 6 wk with vanco plus ertapenem as outpatient - recommend that he gets vanco trouch on morning of 3/10 to ensure he is at appropriate dose - would need weekly cbc and bmp x 6 wk - will check sed rate and crp - at the end of 6 wk of IV antibiotics, if inflammatory markers still elevated, can do suppression with bactrim since ecoli isolate  - we will see back in id clinic in 2-4 wk Trina Asch B. [place] [facility] [phone]

## 2014-04-23 NOTE — Clinical Social Work Psychosocial (Signed)
Clinical Social Work Department BRIEF PSYCHOSOCIAL ASSESSMENT 04/23/2014  Patient:  Randy, [name]     Account Number:  [id number]     Admit date:  04/19/2014  Clinical Social Worker:  Delrae Sawyers  Date/Time:  04/23/2014 11:29 AM  Referred by:  Physician  Date Referred:  04/23/2014 Referred for  SNF Placement   Other Referral:   none.   Interview type:  Patient Other interview type:   none.    PSYCHOSOCIAL DATA Living Status:  FAMILY Admitted from facility:   Level of care:   Primary support name:  Randy [name] Primary support relationship to patient:  CHILD, ADULT Degree of support available:   Strong support system.    CURRENT CONCERNS Current Concerns  Post-Acute Placement   Other Concerns:   none.    SOCIAL WORK ASSESSMENT / PLAN CSW received referral for possible SNF placement at time of discharge. CSW Surveyor, quantity met with patient at bedside, stating that patient requesting to discharge home once medically stable for discharge. Per CSW Surveyor, quantity, patient reports living at home with patient's daughter and son-in-law.    CSW confirmed with patient's daughter, Randy [name], regarding patient being able to return home once medically stable for discharge. Patient's daughter informed CSW patient is able to return home once medically stable. Per patient's daughter, patient has 24/7 caregivers through a private company. Patient's daughter requesting home health PT for patient at time of discharge.  RNCM and MD updated regarding change in discharge disposition.    CSW spoke with patient's son/HCPOA, Randy [name], regarding patient's discharge disposition. Patient's son expressed understanding of patient's request to discharge home once medically stable. Patient's son requested CSW provide patient's son with a list of [facility] SNF's in the event that patient requires placement in the future. Patient's son reports that patient's son is HCPOA for patient,  patient's daughter, and patient's son-in-law. Patient's son expressed gratitude for CSW.    Per RNCM, patient's daughter requesting EMS transportation for patient at time of discharge. CSW to arrange for transportation once patient medically stable for discharge.    CSW signing off. No other needs addressed.   Assessment/plan status:  Psychosocial Support/Ongoing Assessment of Needs Other assessment/ plan:   none.   Information/referral to community resources:   Patient to be discharged home with home health services.    PATIENT'S/FAMILY'S RESPONSE TO PLAN OF CARE: Patient and patient's family understanding and agreeable to CSW plan of care. Patient nor patient's family expressed any further questions or concerns.       Lubertha Sayres, [place] [phone]) Licensed Clinical Social Worker Orthopedics [phone]) and Surgical [phone])

## 2014-04-23 NOTE — Discharge Instructions (Signed)
You will be receiving antibiotics throughout your IV site (PICC line) every day for the next 6 weeks.  You will be getting weekly blood draw to follow your blood count and electrolytes (CBC and BMET).  You will be resuming your warfarin as an outpatient.  Please attend your follow up appointments with Dr.

## 2014-04-23 NOTE — Discharge Summary (Signed)
Name: Randy [name] MRN: [id number] DOB: [birth year] 81 y.o. PCP: Pcp Not In System  Date of Admission: 04/19/2014  2:24 PM Date of Discharge: 04/23/2014 Attending Physician: Aldine Contes, MD  Discharge Diagnosis: Principal Problem:   Septic joint of right knee joint Active Problems:   Hyperlipidemia   Hypertension   Anxiety   History of MI (myocardial infarction)   UTI (lower urinary tract infection)   History of DVT (deep vein thrombosis)   Diabetes mellitus without complication   Chronic kidney disease (CKD), stage III (moderate)   Infection of total right knee replacement  Discharge Medications:   Medication List    TAKE these medications        oxyCODONE-acetaminophen 5-325 MG per tablet  Commonly known as:  ROXICET  Take 1 tablet by mouth every 4 (four) hours as needed.     tizanidine 2 MG capsule  Commonly known as:  ZANAFLEX  Take 1 capsule (2 mg total) by mouth 3 (three) times daily.      ASK your doctor about these medications        A+D DIAPER RASH EX  Apply 1 application topically every 2 (two) hours as needed (diaper rash).     acetaminophen-codeine 300-30 MG per tablet  Commonly known as:  TYLENOL #3  Take 1 tablet by mouth every 4 (four) hours as needed (pain).     antiseptic oral rinse 0.05 % Liqd solution  Commonly known as:  CPC / CETYLPYRIDINIUM CHLORIDE 0.05%  7 mLs by Mouth Rinse route 2 (two) times daily.     atorvastatin 10 MG tablet  Commonly known as:  LIPITOR  Take 10 mg by mouth at bedtime.     busPIRone 15 MG tablet  Commonly known as:  BUSPAR  Take 1 tablet (15 mg total) by mouth 2 (two) times daily.     carvedilol 3.125 MG tablet  Commonly known as:  COREG  Take 3.125 mg by mouth 2 (two) times daily with a meal.     clopidogrel 75 MG tablet  Commonly known as:  PLAVIX  Take 75 mg by mouth daily.     eucerin cream  Apply 1 application topically as needed for dry skin.     EXUDERM LP 4"X4" Pads  Apply 1 each  topically once a week.     feeding supplement (ENSURE COMPLETE) Liqd  Take 237 mLs by mouth 2 (two) times daily between meals.     insulin NPH Human 100 UNIT/ML injection  Commonly known as:  HUMULIN N,NOVOLIN N  Inject 14 Units into the skin 2 (two) times daily before a meal. With breakfast and dinner     miconazole 2 % powder  Commonly known as:  MICOTIN  Apply 1 application topically daily as needed for itching.     ranitidine 150 MG capsule  Commonly known as:  ZANTAC  Take 150 mg by mouth 2 (two) times daily.     tamsulosin 0.4 MG Caps capsule  Commonly known as:  FLOMAX  Take 1 capsule (0.4 mg total) by mouth daily.     warfarin 5 MG tablet  Commonly known as:  COUMADIN  Take 5 mg by mouth daily. Take 1 1/2 tablets ( 7.5 mg) on Tuesday and Saturday, take 1 tablet (5 mg) on Sunday, Monday, Wednesday, Thursday, Friday     warfarin 7.5 MG tablet  Commonly known as:  COUMADIN  Take 1 tablet (7.5 mg total) by mouth one time only at 6 PM.  Disposition and follow-up:   RandyJaiel B [name] was discharged from [facility] in Stable condition.  At the hospital follow up visit please address:  1.  Septic Right Knee: Patient to continue ertapenem daily and vancomycin (for MRSA colonization) for 6 weeks with weekly CBC and BMP x 6 weeks. If inflammatory markers elevated in 6 weeks, can do suppression with bactrim since ecoli isolate. He is to remain weight-bearing as tolerated with home PT and with a knee immobilizer which should remain in place except for skin care, no knee flexing.  2.  Labs / imaging needed at time of follow-up: none  3.  Pending labs/ test needing follow-up: none  Follow-up Appointments:     Follow-up Information    Follow up with Kerin Salen, MD In 2 weeks.   Specialty:  Orthopedic Surgery   Contact information:   [place] [address] [phone]       Follow up with [facility].   Why:   They will contact you to schedule home nurse, therapy and social worker visits.   Contact information:   [address] [place] [address] [phone]       Discharge Instructions: Discharge Instructions    Partial weight bearing    Complete by:  As directed   % Body Weight:  50 with walker and therapy assist  Laterality:  right           Consultations: Treatment Team:  Kerin Salen, MD  Procedures Performed:  Dg Chest 2 View  04/19/2014   CLINICAL DATA:  Hypertension, diabetes, past smoker right knee pain  EXAM: CHEST  2 VIEW  COMPARISON:  03/13/2014  FINDINGS: Cardiomegaly again noted. Chronic elevation of the right hemidiaphragm. No acute infiltrate or pleural effusion. No pulmonary edema. Dual lead cardiac pacemaker with leads in right atrium and right ventricle. Osteopenia and mild degenerative changes thoracic spine.  IMPRESSION: No active disease.  Dual lead cardiac pacemaker in place.   Electronically Signed   By: Lahoma Crocker M.D.   On: 04/19/2014 16:10   Dg Knee Complete 4 Views Right  04/19/2014   CLINICAL DATA:  Knee pain, status post knee replacement 2010  EXAM: RIGHT KNEE - COMPLETE 4+ VIEW  COMPARISON:  None.  FINDINGS: Four views of the right knee submitted. No acute fracture or subluxation. There is right knee prosthesis in anatomic alignment. Spurring of patella. Small joint effusion. There is no evidence of loosening of prosthesis.  IMPRESSION: No acute fracture or subluxation. Right knee prosthesis in anatomic alignment. No evidence of loosening of prosthesis. Spurring of patella. Small joint effusion.   Electronically Signed   By: Lahoma Crocker M.D.   On: 04/19/2014 16:05   Dg Knee Right Port  04/20/2014   CLINICAL DATA:  Status post removal of prior knee prosthesis  EXAM: PORTABLE RIGHT KNEE - 1-2 VIEW  COMPARISON:  04/19/2014  FINDINGS: The previously seen prosthesis has been removed. Two surgical drains are noted in place. A PMMA spacer has been placed. No  acute abnormality is noted.  IMPRESSION: Status post removal of prosthesis and debridement of knee joint with spacer placement.   Electronically Signed   By: Inez Catalina M.D.   On: 04/20/2014 17:02   Admission HPI: Randy [name] is a 81 year old male with type 2 diabetes, history of MI status post stent, hypertension, hyperlipidemia who presents today with right knee pain. His caregiver Enos Fling was present and contributed to the interview.  Today, he went to see his orthopedist Dr. Mayer Camel for right knee pain. He reports that there was increased warmth, erythema, swelling and wanted to have it evaluated. He reports he has had knee issues for the past 5-6 years did not feel as though the pain was worse. Per orthopedics, he had a right total knee arthroplasty performed by Dr. Stana Bunting in 2010. His caregiver however reported that he had decreased mobility using his walker at home which raise suspicion.  He was hospitalized from 03/11/14 through 03/14/14 for Escherichia coli urosepsis secondary to pyelonephritis for which he was given imipenem/cilastatin due to history of ESBL infections but was transitioned to ertapenem for total 12 day course of antibiotics. His PICC line was discontinued for 4 to 5 days ago which the caregiver confirms. Currently, he denies any fever, chills, dysuria, flank pain, hematuria, increased urinary frequency, abdominal pain, nausea, vomiting.  In the office, he was found to have an effusion of his right knee, and 40 mL of purulent fluid was collected and sent for culture, Gram stain, cell count.  Hospital Course by problem list: Principal Problem:   Septic joint of right knee joint Active Problems:   Hyperlipidemia   Hypertension   Anxiety   History of MI (myocardial infarction)   UTI (lower urinary tract infection)   History of DVT (deep vein thrombosis)   Diabetes mellitus without complication   Chronic kidney disease (CKD), stage III (moderate)   Infection of total right  knee replacement   Mr. Lazarus is an 81 yo man with a history of hypertension, hyperlipidemia, DM2, MI and CKD who had a right knee total arthroplasty in 2010 and presented on 3/3 with septic arthritis of the right knee.   Septic Right Knee Joint At Site of Prior Prosthesis : Patient had an irrigation and debridment of the right total knee with removal of ACL implant and spacer placement. He had presented in sepsis and was found to have a septic right knee joint at site of hardware nidus (which was treated with IV abx). Culture from outpatient orthopaedist had 122 cells, polys, monos, no growth x10 days. Drain removed by orthopaedics on day of discharge. Patient to continue ertapenem daily and vancomycin (for MRSA colonization) for 6 weeks with weekly CBC and BMP x 6 weeks. If inflammatory markers elevated in 6 weeks, can do suppression with bactrim since ecoli isolate. SNF was recommended, but patient wished to return home. He is to remain WBAT with a knee immobilizer which should remain in place except for skin care, no knee flexing.  Chronic Anticoagulation: On chronic warfarin. INR today; 1.61. Resuming warfarin at discharge.  DM2: Last A1c 6.9% in 02/2013. Fasting blood sugars- 197. On NPH at home, 14 u BID.  CAD: On home plavix 75 mg daily.  CKD Stage III: Creatinine at baseline (1.49).  BPH: Continue home Flomax 0.4 mg daily. Used condom cath in hospital.   Hyperlipidemia: On home atorvastatin 5 mg daily.  Hypertension: On home coreg 3.125 mg BID  GERD: On daily pepcid 20 mg BID  Anxiety: On BuSpar 15 mg BID at home.  Discharge Vitals:   BP 145/60 mmHg  Pulse 67  Temp(Src) 97.4 F (36.3 C) (Oral)  Resp 18  Wt 200 lb 6 oz (90.89 kg)  SpO2 99%  Discharge Labs:  Results for orders placed or performed during the hospital encounter of 04/19/14 (from the past 24 hour(s))  Glucose, capillary     Status: Abnormal   Collection Time: 04/22/14  12:24 PM  Result Value Ref Range    Glucose-Capillary 231 (H) 70 - 99 mg/dL  Glucose, capillary     Status: Abnormal   Collection Time: 04/22/14  4:41 PM  Result Value Ref Range   Glucose-Capillary 131 (H) 70 - 99 mg/dL  Glucose, capillary     Status: Abnormal   Collection Time: 04/22/14  9:32 PM  Result Value Ref Range   Glucose-Capillary 168 (H) 70 - 99 mg/dL  Glucose, capillary     Status: Abnormal   Collection Time: 04/23/14  6:02 AM  Result Value Ref Range   Glucose-Capillary 240 (H) 70 - 99 mg/dL  Protime-INR     Status: Abnormal   Collection Time: 04/23/14  6:35 AM  Result Value Ref Range   Prothrombin Time 22.9 (H) 11.6 - 15.2 seconds   INR 2.01 (H) 0.00 - 1.49  CBC     Status: Abnormal   Collection Time: 04/23/14  6:35 AM  Result Value Ref Range   WBC 8.1 4.0 - 10.5 K/uL   RBC 3.27 (L) 4.22 - 5.81 MIL/uL   Hemoglobin 8.8 (L) 13.0 - 17.0 g/dL   HCT 27.4 (L) 39.0 - 52.0 %   MCV 83.8 78.0 - 100.0 fL   MCH 26.9 26.0 - 34.0 pg   MCHC 32.1 30.0 - 36.0 g/dL   RDW 16.3 (H) 11.5 - 15.5 %   Platelets 420 (H) 150 - 400 K/uL  Glucose, capillary     Status: Abnormal   Collection Time: 04/23/14 11:38 AM  Result Value Ref Range   Glucose-Capillary 112 (H) 70 - 99 mg/dL   Comment 1 Notify RN     Signed: Karlene Einstein, MD 04/23/2014, 12:12 PM    Services Ordered on Discharge: [facility] PT/OT/RN/SW Equipment Ordered on Discharge: rolling walker with 5" wheels, 3 in 1

## 2014-04-23 NOTE — Progress Notes (Signed)
Subjective: Randy [name] feels "fine" this morning. He has some pain in his right knee that he estimates at 6/10 in intensity when he moves. At rest, he is not in pain. He has been having BMs. He does not wish to go to a SNF, and would like to return to his daughter's house.  Interval Events:  - Clinical Social Worker has spoken with son about discharge to daughter's house with [facility] services; he is agreeable, patient is agreeable, daughter is agreeable  Objective: Vital signs in last 24 hours: Filed Vitals:   04/22/14 1600 04/22/14 2133 04/23/14 0500 04/23/14 0604  BP:  130/48  145/60  Pulse:  70  67  Temp:  97.9 F (36.6 C)  97.4 F (36.3 C)  TempSrc:  Oral  Oral  Resp: $Remo'18 18  18  'fOaQJ$ Weight:   200 lb 6 oz (90.89 kg)   SpO2: 100% 99%  99%   Weight change:   Intake/Output Summary (Last 24 hours) at 04/23/14 0714 Last data filed at 04/23/14 0648  Gross per 24 hour  Intake    360 ml  Output    675 ml  Net   -315 ml   Physical Exam: General: NAD, lying in the dark, pleasant HEENT: AT, [place], EOMI, no lymphadenopathy Cardiac: RRR, no added sounds appreciated Pulm: clear to auscultation bilaterally, no wheezes, rales, or rhonchi Abd: soft, nontender, nondistended, BS present Ext: Brace extending from thigh to ankle, right foot slightly swollen, compression ace bandage present, some pain on movement Neuro: Alert, moving extremities voluntarily  Lab Results: Basic Metabolic Panel:  Recent Labs Lab 04/20/14 0633 04/21/14 0405 04/22/14 0458  NA 139 135 133*  K 4.1 4.5 4.2  CL 106 103 101  CO2 $Re'23 25 28  'Iis$ GLUCOSE 118* 202* 169*  BUN $Re'14 14 15  'rOE$ CREATININE 1.48* 1.61* 1.57*  CALCIUM 8.7 8.1* 8.0*  MG 2.0  --   --   PHOS 3.1  --   --    Liver Function Tests:  Recent Labs Lab 04/19/14 1652  AST 25  ALT 16  ALKPHOS 69  BILITOT 0.4  PROT 7.3  ALBUMIN 2.9*   CBC:  Recent Labs Lab 04/19/14 1530  04/21/14 0405 04/22/14 0458  WBC 9.1  < > 8.5 8.9  NEUTROABS 6.4  --    --   --   HGB 13.2  < > 10.6* 9.1*  HCT 40.7  < > 32.9* 28.6*  MCV 83.9  < > 83.9 84.4  PLT 490*  < > 436* 421*  < > = values in this interval not displayed. CBG:  Recent Labs Lab 04/22/14 0619 04/22/14 1105 04/22/14 1224 04/22/14 1641 04/22/14 2132 04/23/14 0602  GLUCAP 164* 245* 231* 131* 168* 240*   Coagulation:  Recent Labs Lab 04/19/14 1652 04/20/14 0633 04/21/14 0405 04/22/14 0458  LABPROT 27.4* 26.8* 29.1* 30.0*  INR 2.52* 2.45* 2.72* 2.84*   Urinalysis:  Recent Labs Lab 04/19/14 1530  COLORURINE YELLOW  LABSPEC 1.016  PHURINE 5.5  GLUCOSEU NEGATIVE  HGBUR MODERATE*  BILIRUBINUR NEGATIVE  KETONESUR NEGATIVE  PROTEINUR 30*  UROBILINOGEN 0.2  NITRITE NEGATIVE  LEUKOCYTESUR LARGE*   Studies/Results: Dg Chest 2 View  04/19/2014   CLINICAL DATA:  IMPRESSION: No active disease.  Dual lead cardiac pacemaker in place.   Electronically Signed   By: Lahoma Crocker M.D.   On: 04/19/2014 16:10   Dg Knee Complete 4 Views Right  04/19/2014   CLINICAL DATA:   IMPRESSION: No acute  fracture or subluxation. Right knee prosthesis in anatomic alignment. No evidence of loosening of prosthesis. Spurring of patella. Small joint effusion.   Electronically Signed   By: Lahoma Crocker M.D.   On: 04/19/2014 16:05   Dg Knee Right Port  04/20/2014   CLINICAL DATA:   IMPRESSION: Status post removal of prosthesis and debridement of knee joint with spacer placement.   Electronically Signed   By: Inez Catalina M.D.   On: 04/20/2014 17:02   Medications: I have reviewed the patient's current medications. Scheduled Meds: . antiseptic oral rinse  7 mL Mouth Rinse BID  . atorvastatin  5 mg Oral Daily  . busPIRone  15 mg Oral BID  . carvedilol  3.125 mg Oral BID WC  . Chlorhexidine Gluconate Cloth  6 each Topical Q0600  . clopidogrel  75 mg Oral Daily  . docusate sodium  100 mg Oral BID  . famotidine  20 mg Oral BID  . feeding supplement (ENSURE COMPLETE)  237 mL Oral BID BM  . insulin aspart   0-15 Units Subcutaneous TID WC  . insulin NPH Human  14 Units Subcutaneous BID AC & HS  . mupirocin ointment  1 application Nasal BID  . piperacillin-tazobactam (ZOSYN)  IV  3.375 g Intravenous 3 times per day  . tamsulosin  0.4 mg Oral Daily   Continuous Infusions: . dextrose 5 % and 0.45 % NaCl with KCl 20 mEq/L 10 mL/hr at 04/21/14 1357  . lactated ringers 50 mL/hr at 04/20/14 1246   Assessment/Plan:  Randy [name] is an 81 yo man with a history of hypertension, hyperlipidemia, DM2, MI and CKD who had a right knee total arthroplasty in 2010 and presented on 3/3 with septic arthritis of the right knee.   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 Developed at site of hardware nidus in context of recent sepsis (which was treated with IV abx). ESR and CRP pending. Culture from outpatient orthopaedist had 122 cells, polys, monos, no growth x10 days. - Transition zosyn (to cover e coli, previous agent in urosepsis) to ertapenem daily and vancomycin (for MRSA colonization). Each will be for 6 weeks - PICC placement for above antibiotic management - Needs weekly CBC and BMP x 6 weeks - If inflammatory markers elevated in 6 weeks, can do suppression with bactrim since ecoli isolate - Appreciate ID consult - Appreciate PT/OT consult (recommend SNF); patient wishes to return home - Pull out wound drain once it stops draining / falls out - Has f/u with orthopaedics in 2 weeks - Continue Oxycodone 5-10 at [place] to d/c patient and resume coumadin per orthopaedics  Chronic Anticoagulation: Warfarin held. INR today; 2.84. - Resume warfarin at discharge  DM2: Last A1c 6.9% in 02/2013. Fasting blood sugars- 197. On NPH at home, 14 u BID. - SSI-moderate - Increase NPH to 14U BID  CAD: On home plavix. - Held Plavix 75 mg daily.  CKD Stage III: Creatinine at baseline  (1.57). - Continue to trend  BPH: Continue home Flomax. Is on condom cath.  - Continue Flomax 0.4 mg daily  Hyperlipidemia: On home atorvastatin. - Continue atorvastatin 5 mg daily  Hypertension: On home coreg.  - Continue coreg 3.125 mg BID  GERD: On daily pepcid. - Continue pepcid 20 mg BID  Anxiety: On BuSpar at home. - Continue BuSpar 15 mg BID  Diet: Carb  mord  DVT Ppx: INR 2.84.  Code Status: Full  - Defer to caregivers Teonte [[phone]] and Dyann Ruddle [phone]] if patient lacks decision-making capacity. Currently has capacity. - Confirmed with patient on admission  Dispo: Disposition is deferred at this time, awaiting improvement of current medical problems.  Anticipated discharge in approximately 0 day(s).    LOS: 4 days   Drucilla Schmidt, MD 04/23/2014, 7:14 AM

## 2014-04-23 NOTE — Progress Notes (Addendum)
ANTIBIOTIC CONSULT NOTE - FOLLOW UP  Pharmacy Consult for Vancomycin and Imipenem -> Ertapenem Indication: prosthetic joint infection, right total knee  No Known Allergies  Patient Measurements: Height: 67 inches Weight: 200 lb 6 oz (90.89 kg)  Vital Signs: Temp: 97.8 F (36.6 C) (03/07 1235) Temp Source: Oral (03/07 0604) BP: 133/52 mmHg (03/07 1235) Pulse Rate: 70 (03/07 1235) Intake/Output from previous day: 03/06 0701 - 03/07 0700 In: 360 [P.O.:360] Out: 675 [Urine:500; Drains:175] Intake/Output from this shift: Total I/O In: 240 [P.O.:240] Out: 730 [Urine:700; Drains:30]  Labs:  Recent Labs  04/21/14 0405 04/22/14 0458 04/23/14 0635  WBC 8.5 8.9 8.1  HGB 10.6* 9.1* 8.8*  PLT 436* 421* 420*  CREATININE 1.61* 1.57*  --    Estimated Creatinine Clearance: 35.6 mL/min (by C-G formula based on Cr of 1.57).  Assessment:   Day # 4 Zosyn for prosthetic joint infection and recent ESBL-E coli bacteremia.  POD #3 I&D of infected right total knee with removal of implants, placement of antibiotic spacer.   To change Zosyn to Vancomycin and Imipenem per ID recommendtaions.  Planning 6 weeks IV antibiotics, with Imipenem changing to Ertapenem at discharge. Discussed carbapenem choice briefly with Dr. Sherrine Maples. For PICC line today, then discharge to SNF.  Goal of Therapy:  Vancomycin trough level 15-20 mcg/ml appropriate Ertapenem dose for renal function  Plan:   Begin Vancomycin 1250 mg IV q24hrs.  With discharge to SNF planned for today, will begin with Ertapenem, rather than Imipenem (which would require multiple daily doses).   Ertapenem 1 gram IV q24hrs.  Will need decrease to 500 mg IV q24hrs if renal function worsens/creatinine clearance drops to 10-30 ml/min range.   Will need Vancomycin trough level at steady-state, then weekly while on Vancomycin. Weekly CBC and bmet.  Arty Baumgartner,  Pager: [phone] 04/23/2014,12:54 PM

## 2014-04-23 NOTE — Progress Notes (Signed)
Peripherally Inserted Central Catheter/Midline Placement  The IV Nurse has discussed with the patient and/or persons authorized to consent for the patient, the purpose of this procedure and the potential benefits and risks involved with this procedure.  The benefits include less needle sticks, lab draws from the catheter and patient may be discharged home with the catheter.  Risks include, but not limited to, infection, bleeding, blood clot (thrombus formation), and puncture of an artery; nerve damage and irregular heat beat.  Alternatives to this procedure were also discussed.  PICC/Midline Placement Documentation  PICC / Midline Single Lumen 11/08/28 PICC Right Basilic 38 cm 0 cm (Active)       Mallie Giambra, Nicolette Bang 04/23/2014, 5:03 PM

## 2014-04-23 NOTE — Significant Event (Signed)
Patient is going to be kept overnight instead of discharge today; orthopaedics to remove drain tomorrow as requested by ID. Patient to be transported tomorrow by nonemergent ambulance home to receive his IV antibiotics (vancomycin and ertapenem) through his PICC with [facility] PT/OT/RN/SW in place.   Venita Lick, MD [phone]

## 2014-04-23 NOTE — Progress Notes (Signed)
PATIENT ID: Randy [name]  MRN: [id number]  DOB/AGE:  09/01/1924 / 81 y.o.  3 Days Post-Op Procedure(s) (LRB): IRRIGATION AND DEBRIDMENT RIGHT TOTAL KNEE REMOVE  ACL IMPLANTED AND PLACE SPACER (Right)    PROGRESS NOTE Subjective: Patient is alert, oriented, no Nausea, no Vomiting, yes passing gas, yes Bowel Movement. Taking PO well. Denies SOB, Chest or Calf Pain. Using Incentive Spirometer, PAS in place. Ambulate pt may work on transfers with therapy assist and is to wear knee immobilizer at all times.  No CPM as pt has ABX spacer.   Patient reports pain as 6 on 0-10 scale .  Results from in office aspiration prior to surgery has came back with no growth.  He did have 122,000 cells with abundant poly's and mono's.      Objective: Vital signs in last 24 hours: Filed Vitals:   04/22/14 1600 04/22/14 2133 04/23/14 0500 04/23/14 0604  BP:  130/48  145/60  Pulse:  70  67  Temp:  97.9 F (36.6 C)  97.4 F (36.3 C)  TempSrc:  Oral  Oral  Resp: 18 18  18   Weight:   90.89 kg (200 lb 6 oz)   SpO2: 100% 99%  99%      Intake/Output from previous day: I/O last 3 completed shifts: In: 360 [P.O.:360] Out: 1225 [Urine:950; Drains:275]   Intake/Output this shift: Total I/O In: 240 [P.O.:240] Out: -    LABORATORY DATA:  Recent Labs  04/21/14 0405  04/22/14 0458  04/22/14 1641 04/22/14 2132 04/23/14 0602 04/23/14 0635  WBC 8.5  --  8.9  --   --   --   --  8.1  HGB 10.6*  --  9.1*  --   --   --   --  8.8*  HCT 32.9*  --  28.6*  --   --   --   --  27.4*  PLT 436*  --  421*  --   --   --   --  420*  NA 135  --  133*  --   --   --   --   --   K 4.5  --  4.2  --   --   --   --   --   CL 103  --  101  --   --   --   --   --   CO2 25  --  28  --   --   --   --   --   BUN 14  --  15  --   --   --   --   --   CREATININE 1.61*  --  1.57*  --   --   --   --   --   GLUCOSE 202*  --  169*  --   --   --   --   --   GLUCAP  --   < >  --   < > 131* 168* 240*  --   INR 2.72*  --  2.84*  --    --   --   --  2.01*  CALCIUM 8.1*  --  8.0*  --   --   --   --   --   < > = values in this interval not displayed.  Examination: Neurologically intact Neurovascular intact Sensation intact distally Intact pulses distally Dorsiflexion/Plantar flexion intact Incision: dressing C/D/I No cellulitis present Compartment soft}  Assessment:  3 Days Post-Op Procedure(s) (LRB): IRRIGATION AND DEBRIDMENT RIGHT TOTAL KNEE REMOVE  ACL IMPLANTED AND PLACE SPACER (Right) ADDITIONAL DIAGNOSIS: Expected Acute Blood Loss Anemia, Acute Blood Loss Anemia, Diabetes and BPH, CKD stage 3, CAD, HTN, Anxiety, GERD.  Plan: PT/OT WBAT with Knee immobilizer and therapy asist. DVT Prophylaxis:  SCDx72hrs, pt was on chronic anticoagulation prior to sx.  Per medicine - No anticoag for now, No DVT ppx/tx for now.  DISCHARGE PLAN: Pt's son has POA and would like pt to go to SNF.  Pt would like to go home with daughter. We plan on consulting infectious disease to have pt on correct oral abx at discharge.  DISCHARGE NEEDS: HHPT, HHRN, Walker and 3-in-1 comode seat     Karmella Bouvier R 04/23/2014, 9:45 AM

## 2014-04-23 NOTE — Progress Notes (Signed)
Patient seen and examined with Dr. Sherrine Maples. Case d/w residents in detail. I agree with findings and plan as documented in Dr. Sandi Raveling note.  Patient is stable for d/c home today on IV ertapenem and vancomycin for sptic R knee joint with prosthesis involvement. ID recommendations appreciated. Patient will need PICC line for 6 weeks of IV abx, Ok to resume warfarin per orthopedics

## 2014-04-23 NOTE — Progress Notes (Signed)
CARE MANAGEMENT NOTE 04/23/2014  Patient:  Randy [name], Randy [name]   Account Number:  [id number]  Date Initiated:  04/23/2014  Documentation initiated by:  [facility]  Subjective/Objective Assessment:   infected right total knee     Action/Plan:   PT/OT evals recommended SNF, patient refused SNF, daughter agreeable to patient returning home   Anticipated DC Date:  04/23/2014   Anticipated DC Plan:  [place]  In-house referral  Clinical Social Worker      DC Forensic scientist  CM consult      [facility] Choice  HOME HEALTH   Choice offered to / List presented to:  C-4 Adult Children        [place] arranged  HH-1 RN  [place].   Status of service:  Completed, signed off Medicare Important Message given?  YES Date Medicare IM given:  04/23/2014 Medicare IM given by:  [facility]  Discharge Disposition:  [place]  Per UR Regulation:  Reviewed for med. necessity/level of care/duration of stay   Comments:  04/23/14 Spoke with CSW, patient refusing SNF, daughter and physician agreeable with patient going home with [place]. Contacted patient's daughter Caren Griffins, who he lives with, she would like [facility] which they have worked with previously. Contacted Miranda at [employer] and set up [facility], [place], [place] and Education officer, museum. Caren Griffins would like for patient to be transported home via ambulance. CSW informed.Updated patient.

## 2014-04-23 NOTE — Progress Notes (Signed)
Physical Therapy Treatment Patient Details Name: Randy [name] MRN: [id number] DOB: [birth year] Today's Date: 04/23/2014    History of Present Illness Pt is an 81 y.o. Male s/p I&D of Right Total Knee Removal of ACL Implant and placement of antibiotic spacer on 04/20/14 due to infection. 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     PT Comments    Making slow progress with mobility; Appreciate SW input and work on this case; While I still believe pt would benefit from post-acute rehab for more frequent and consistent therapy services, I of course recognize the pt's choice, and provided he has consistent 24 hour assist at home (which he does), home with HHPT/OT/Aide And RN follow up is a viable option;   Will update equipment recs for a drop-arm BSC, sliding board, tub transfer bench, and it is worth considering a hospital bed if pt does not already have; Agree with ambulance transport home.   Follow Up Recommendations  SNF;Supervision/Assistance - 24 hour  Patient and family choosing home with [facility] therapies/services     Equipment Recommendations  Other (comment);Rolling walker with 5" wheels;3in1 (PT) (consider drop-arm BSC and sliding board), hospital bed, ambulance transport home   Recommendations for Other Services       Precautions / Restrictions Precautions Precautions: Fall;Knee Precaution Comments: educated pt on knee precautions and use of KI at all times Required Braces or Orthoses: Knee Immobilizer - Right Knee Immobilizer - Right: On at all times Restrictions [name]: Partial weight bearing RLE Partial Weight Bearing Percentage or Pounds: 50    Mobility  Bed Mobility Overal bed mobility: Needs Assistance Bed Mobility: Supine to Sit;Sit to Supine     Supine to sit: Mod assist;HOB elevated Sit to supine: Mod assist   General bed mobility comments: Mod A to rotate hips and progress LEs to EOB. HOB elevated and heavy use of  bed rails with A to pull to sitting. Propped pt's leg on therapist's leg when sitting EOB until able to slide hips forward to allow R foot to reach floor; and pt with fair balance, and cues for optimal hand placement  Transfers Overall transfer level: Needs assistance Equipment used: 2 person hand held assist Transfers: Stand Pivot Transfers   Stand pivot transfers: +2 safety/equipment;Max assist       General transfer comment: Performed basic stand pivot transfer bed to recliner, then back to bed; cues for technique, and especially to initiate with anterior weight shift, and reach across chair for far armrest; Required L knee blocking for stability; opted to help pt back to bed for a bath as he was quite soiled with stool (and we didn't discover this until transferred to the chair)  Ambulation/Gait                 Stairs            Wheelchair Mobility    Modified Rankin (Stroke Patients Only)       Balance     Sitting balance-Leahy Scale: Fair                              Cognition Arousal/Alertness: Awake/alert Behavior During Therapy: WFL for tasks assessed/performed Overall Cognitive Status: Within Functional Limits for tasks assessed (for simple mobility tasks)                 General Comments:  Pt was unaware he had been incontinent of stool    Exercises      General Comments        Pertinent Vitals/Pain Pain Assessment: Faces Faces Pain Scale: Hurts even more Pain Location: RLE with motion Pain Descriptors / Indicators: Grimacing;Discomfort Pain Intervention(s): Limited activity within patient's tolerance;Monitored during session;Repositioned    Home Living                      Prior Function            PT Goals (current goals [name] now be found in the care plan section) Acute Rehab PT Goals Patient Stated Goal: did not state PT Goal Formulation: With patient Time For Goal Achievement: 05/06/14 Potential to  Achieve Goals: Good Progress towards PT goals: Progressing toward goals    Frequency  Min 3X/week    PT Plan Current plan remains appropriate;Equipment recommendations need to be updated    Co-evaluation             End of Session Equipment Utilized During Treatment: Gait belt;Right knee immobilizer Activity Tolerance: Patient tolerated treatment well Patient left: in bed;with call bell/phone within reach     Time: 1027-1055 PT Time Calculation (min) (ACUTE ONLY): 28 min  Charges:  $Therapeutic Activity: 23-37 mins                    G Codes:      Randy [name] 04/23/2014, 12:12 PM  Randy [name], [place] Pager [phone] Office [phone]

## 2014-04-24 LAB — CBC
HEMATOCRIT: 27.8 % — AB (ref 39.0–52.0)
Hemoglobin: 8.9 g/dL — ABNORMAL LOW (ref 13.0–17.0)
MCH: 26.9 pg (ref 26.0–34.0)
MCHC: 32 g/dL (ref 30.0–36.0)
MCV: 84 fL (ref 78.0–100.0)
Platelets: 481 10*3/uL — ABNORMAL HIGH (ref 150–400)
RBC: 3.31 MIL/uL — ABNORMAL LOW (ref 4.22–5.81)
RDW: 16.3 % — ABNORMAL HIGH (ref 11.5–15.5)
WBC: 9 10*3/uL (ref 4.0–10.5)

## 2014-04-24 LAB — BASIC METABOLIC PANEL
Anion gap: 4 — ABNORMAL LOW (ref 5–15)
BUN: 14 mg/dL (ref 6–23)
CALCIUM: 8.7 mg/dL (ref 8.4–10.5)
CO2: 27 mmol/L (ref 19–32)
Chloride: 105 mmol/L (ref 96–112)
Creatinine, Ser: 1.49 mg/dL — ABNORMAL HIGH (ref 0.50–1.35)
GFR calc Af Amer: 47 mL/min — ABNORMAL LOW (ref >90)
GFR calc non Af Amer: 40 mL/min — ABNORMAL LOW (ref >90)
GLUCOSE: 100 mg/dL — AB (ref 70–99)
Potassium: 4.1 mmol/L (ref 3.5–5.1)
Sodium: 136 mmol/L (ref 135–145)

## 2014-04-24 LAB — CLOSTRIDIUM DIFFICILE BY PCR: CDIFFPCR: NEGATIVE

## 2014-04-24 LAB — GLUCOSE, CAPILLARY
GLUCOSE-CAPILLARY: 179 mg/dL — AB (ref 70–99)
Glucose-Capillary: 98 mg/dL (ref 70–99)
Glucose-Capillary: 175 mg/dL — ABNORMAL HIGH (ref 70–99)

## 2014-04-24 LAB — PROTIME-INR
INR: 1.61 — AB (ref 0.00–1.49)
PROTHROMBIN TIME: 19.3 s — AB (ref 11.6–15.2)

## 2014-04-24 MED ORDER — HEPARIN SOD (PORK) LOCK FLUSH 100 UNIT/ML IV SOLN
250.0000 [IU] | INTRAVENOUS | Status: AC | PRN
  Administered 2014-04-24: 250 [IU]

## 2014-04-24 NOTE — Progress Notes (Signed)
Subjective: Mr. Kesling feels fine today. His knee continues to have some pain.  Interval Events:  - Patient was set for discharge yesterday with Lanier in place at home for IV therapy; patient was kept for removal of drain today  Objective: Vital signs in last 24 hours: Filed Vitals:   04/24/14 0000 04/24/14 0400 04/24/14 0500 04/24/14 0646  BP:    138/51  Pulse:    80  Temp:    97.5 F (36.4 C)  TempSrc:    Oral  Resp: _0 Weight:   206 lb 2 oz (93.498 kg)   SpO2: 98% 98%  99%   Weight change: 5 lb 12 oz (2.608 kg)  Intake/Output Summary (Last 24 hours) at 04/24/14 0981 Last data filed at 04/23/14 2257  Gross per 24 hour  Intake    480 ml  Output   1830 ml  Net  -1350 ml   Physical Exam: General: NAD, lying in the dark, pleasant, asking questions about whether daughter knows he is coming home HEENT: AT, [place], EOMI, no lymphadenopathy Cardiac: RRR, no added sounds appreciated Pulm: clear to auscultation bilaterally, no wheezes, rales, or rhonchi Abd: soft, nontender, nondistended, BS present Ext: Brace extending from thigh to ankle, right foot slightly swollen, compression ace bandage present, some pain on movement, drain removed Neuro: Alert, moving extremities voluntarily  Lab Results: Basic Metabolic Panel:  Recent Labs Lab 04/20/14 0633  04/22/14 0458 04/24/14 0450  NA 139  < > 133* 136  K 4.1  < > 4.2 4.1  CL 106  < > 101 105  CO2 23  < > 28 27  GLUCOSE 118*  < > 169* 100*  BUN 14  < > 15 14  CREATININE 1.48*  < > 1.57* 1.49*  CALCIUM 8.7  < > 8.0* 8.7  MG 2.0  --   --   --   PHOS 3.1  --   --   --   < > = values in this interval not displayed. Liver Function Tests:  Recent Labs Lab 04/19/14 1652  AST 25  ALT 16  ALKPHOS 69  BILITOT 0.4  PROT 7.3  ALBUMIN 2.9*   CBC:  Recent Labs Lab 04/19/14 1530  04/23/14 0635 04/24/14 0450  WBC 9.1  < > 8.1 9.0  NEUTROABS 6.4  --   --   --   HGB 13.2  < > 8.8* 8.9*  HCT 40.7  < > 27.4*  27.8*  MCV 83.9  < > 83.8 84.0  PLT 490*  < > 420* 481*  < > = values in this interval not displayed. CBG:  Recent Labs Lab 04/22/14 2132 04/23/14 0602 04/23/14 1138 04/23/14 1806 04/23/14 2127 04/24/14 0644  GLUCAP 168* 240* 112* 230* 140* 98   Coagulation:  Recent Labs Lab 04/21/14 0405 04/22/14 0458 04/23/14 0635 04/24/14 0450  LABPROT 29.1* 30.0* 22.9* 19.3*  INR 2.72* 2.84* 2.01* 1.61*   Urinalysis:  Recent Labs Lab 04/19/14 1530  COLORURINE YELLOW  LABSPEC 1.016  PHURINE 5.5  GLUCOSEU NEGATIVE  HGBUR MODERATE*  BILIRUBINUR NEGATIVE  KETONESUR NEGATIVE  PROTEINUR 30*  UROBILINOGEN 0.2  NITRITE NEGATIVE  LEUKOCYTESUR LARGE*   Studies/Results: Dg Chest 2 View  04/19/2014   CLINICAL DATA:  IMPRESSION: No active disease.  Dual lead cardiac pacemaker in place.   Electronically Signed   By: Lahoma Crocker M.D.   On: 04/19/2014 16:10   Dg Knee Complete 4 Views Right  04/19/2014  CLINICAL DATA:   IMPRESSION: No acute fracture or subluxation. Right knee prosthesis in anatomic alignment. No evidence of loosening of prosthesis. Spurring of patella. Small joint effusion.   Electronically Signed   By: Lahoma Crocker M.D.   On: 04/19/2014 16:05   Dg Knee Right Port  04/20/2014   CLINICAL DATA:   IMPRESSION: Status post removal of prosthesis and debridement of knee joint with spacer placement.   Electronically Signed   By: Inez Catalina M.D.   On: 04/20/2014 17:02   Medications: I have reviewed the patient's current medications. Scheduled Meds: . antiseptic oral rinse  7 mL Mouth Rinse BID  . atorvastatin  5 mg Oral Daily  . busPIRone  15 mg Oral BID  . carvedilol  3.125 mg Oral BID WC  . Chlorhexidine Gluconate Cloth  6 each Topical Q0600  . clopidogrel  75 mg Oral Daily  . docusate sodium  100 mg Oral BID  . ertapenem  1 g Intravenous Q24H  . famotidine  20 mg Oral BID  . feeding supplement (ENSURE COMPLETE)  237 mL Oral BID BM  . insulin aspart  0-15 Units Subcutaneous  TID WC  . insulin NPH Human  14 Units Subcutaneous BID AC & HS  . mupirocin ointment  1 application Nasal BID  . tamsulosin  0.4 mg Oral Daily  . vancomycin  1,250 mg Intravenous Q24H   Continuous Infusions: . dextrose 5 % and 0.45 % NaCl with KCl 20 mEq/L 125 mL/hr at 04/23/14 0857  . lactated ringers 50 mL/hr at 04/20/14 1246   Assessment/Plan:  Mr. Bognar is an 81 yo man with a history of hypertension, hyperlipidemia, DM2, MI and CKD who had a right knee total arthroplasty in 2010 and presented on 3/3 with septic arthritis of the right knee. Ready for discharge with [facility] tomorrow.  Septic Right Knee Joint At Site of Prior Prosthesis : 4 Days Post-Op Procedure(s) (LRB): IRRIGATION AND DEBRIDMENT RIGHT TOTAL KNEE REMOVE ACL IMPLANTED AND PLACE SPACE. Developed at site of hardware nidus in context of recent sepsis (which was treated with IV abx). ESR and CRP pending. Culture from outpatient orthopaedist had 122 cells, polys, monos, no growth x10 days. Drain removed today by orthopaedics. - Continue ertapenem daily and vancomycin (for MRSA colonization). Each will be for 6 weeks. - Needs weekly CBC and BMP x 6 weeks - If inflammatory markers elevated in 6 weeks, can do suppression with bactrim since ecoli isolate - Appreciate PT/OT consult (recommend SNF); patient wishes to return home - Continue Oxycodone 5-10 at [facility] - WBAT, knee immobilizer should remain in place except for skin care, no knee flexing - Has f/u with orthopaedics in 2 weeks  Chronic Anticoagulation: On chronic warfarin. INR today; 1.61. - Resume warfarin at discharge  DM2: Last A1c 6.9% in 02/2013. Fasting blood sugars- 197. On NPH at home, 14 u BID. - SSI-moderate - Continue NPH to 14U BID  CAD: On home plavix. - Held Plavix 75 mg daily.  CKD Stage III: Creatinine at baseline (1.49). - Continue to trend  BPH: Continue home Flomax. Is on condom cath.  - Continue Flomax 0.4 mg daily  Hyperlipidemia: On home  atorvastatin. - Continue atorvastatin 5 mg daily  Hypertension: On home coreg.  - Continue coreg 3.125 mg BID  GERD: On daily pepcid. - Continue pepcid 20 mg BID  Anxiety: On BuSpar at home. - Continue BuSpar 15 mg BID  Diet: Carb mod  DVT Ppx: INR 1.61.  Code  Status: Full  - Defer to caregivers [place] [[phone]] and Dyann Ruddle [[phone]] if patient lacks decision-making capacity. Currently has capacity. - Confirmed with patient on admission  Dispo: Disposition is deferred at this time, awaiting improvement of current medical problems.  Anticipated discharge in approximately 0 day(s).    LOS: 5 days   Karlene Einstein, MD 04/24/2014, 7:18 AM

## 2014-04-24 NOTE — Discharge Planning (Signed)
CSW received call from MD stating patient is ready for discharge. CSW confirmed with patient's RN patient will need to be administered 2pm IV antibiotics prior to EMS transportation. CSW arranged for 4pm EMS pick-up on 04/24/2014. CSW updated patient's daughter, Caren Griffins, regarding discharge. CSW signing off.  Lubertha Sayres, [place] [phone]) Licensed Clinical Social Worker Orthopedics [phone]) and Surgical [phone])

## 2014-04-24 NOTE — Progress Notes (Signed)
Patient seen and examined with Dr. Sherrine Maples. Case d/w residents in detail. I agree with findings and plan as documented in Dr. Sandi Raveling note.  Patient is stable for d/c home today with home PT. Will need to complete 6 week course of IV abx (ertapenem and vancomycin). Outpatient ortho f/u in 2 weeks.

## 2014-04-24 NOTE — Consult Note (Addendum)
WOC wound consult note Reason for Consult: Consult requested for buttocks.  Pt plans to discharge home this afternoon. Wound type: Patchy areas of partial thickness skin loss; appearance consistent with moisture associated skin damage.  Pt is reluctant to admit he is incontinent at times but bedside nurses state that he is of both urine and stool periodically. Pressure Ulcer POA: Yes; it was present on admission, this is NOT a pressure ulcer. Measurement: Affected areas spread out to bilat buttocks, no depth beyond .1cm, irregular edges. Wound bed: Red and moist superficial areas Drainage (amount, consistency, odor) No odor or drainage Periwound: Red macerated skin was noted on admission, this remains surrounding areas. Dressing procedure/placement/frequency: Discussed with patient the importance of applying barrier cream to protect skin and repel moisture when at home and avoid prolonged pressure to affected areas. He verbalizes understanding. Please re-consult if further assistance is needed.  Thank-you,  Julien Girt MSN, [place], [place], [place], [place]

## 2014-04-24 NOTE — Progress Notes (Addendum)
Subjective: 4 Days Post-Op Procedure(s) (LRB): IRRIGATION AND DEBRIDMENT RIGHT TOTAL KNEE REMOVE  ACL IMPLANTED AND PLACE SPACER (Right) Patient reports pain as 2 on 0-10 scale. Has accomplished a bed to chair transfers with physical therapy and his knee immobilizer is in place. Today he is alert and oriented 2 which is an improvement. Synovial fluid Gram stain and culture from 04/19/2014 is no growth, and this culture was done with the patient off of antibiotics for 5 days. The Gram stain did show 122,000 white cells abundant polys and monos no organisms. The patient has had a PICC line placed for IV antibiotics 6 weeks per ID. The cement spacer in his knee has 4 g of vancomycin and 4.5 g of tobramycin in the spacer. He is weightbearing as tolerated.  Objective: Vital signs in last 24 hours: Temp:  [97.5 F (36.4 C)-97.8 F (36.6 C)] 97.5 F (36.4 C) (03/08 0646) Pulse Rate:  [65-80] 80 (03/08 0646) Resp:  [18-20] 20 (03/08 0646) BP: (107-138)/(41-52) 138/51 mmHg (03/08 0646) SpO2:  [98 %-100 %] 99 % (03/08 0646) Weight:  [93.498 kg (206 lb 2 oz)] 93.498 kg (206 lb 2 oz) (03/08 0500)  Intake/Output from previous day: 03/07 0701 - 03/08 0700 In: 480 [P.O.:480] Out: 1830 [Urine:1750; Drains:80] Intake/Output this shift:     Recent Labs  04/22/14 0458 04/23/14 0635 04/24/14 0450  HGB 9.1* 8.8* 8.9*    Recent Labs  04/23/14 0635 04/24/14 0450  WBC 8.1 9.0  RBC 3.27* 3.31*  HCT 27.4* 27.8*  PLT 420* 481*    Recent Labs  04/22/14 0458 04/24/14 0450  NA 133* 136  K 4.2 4.1  CL 101 105  CO2 28 27  BUN 15 14  CREATININE 1.57* 1.49*  GLUCOSE 169* 100*  CALCIUM 8.0* 8.7    Recent Labs  04/23/14 0635 04/24/14 0450  INR 2.01* 1.61*    Neurologically intact ABD soft Neurovascular intact Sensation intact distally Intact pulses distally Dorsiflexion/Plantar flexion intact Incision: no drainage No cellulitis present Compartment soft The patient's drain had  less than 25 mL output and was removed by me today. Assessment/Plan: 4 Days Post-Op removal of infected right total knee prosthesis, placement of cement spacer loaded with vancomycin and tobramycin. Discharge home with home health, weightbearing as tolerated, knee immobilizer should remain in place except for skin care. I told him not to flex his knee. The spacer was placed with the knee in 15 of flexion and that is where he should remain. Is okay to resume his preoperative warfarin. I will see him back in the office in 2 weeks for follow-up sooner if he has any difficulties. Because of his limited ambulation capacity preoperatively the plan is to leave the spacer and indefinitely.  Alesia Oshields J 04/24/2014, 7:05 AM

## 2014-04-25 LAB — CULTURE, BLOOD (ROUTINE X 2)
CULTURE: NO GROWTH
CULTURE: NO GROWTH

## 2014-05-18 ENCOUNTER — Emergency Department (HOSPITAL_COMMUNITY)
Admission: EM | Admit: 2014-05-18 | Discharge: 2014-05-18 | Disposition: A | Discharge status: Home / Self Care | Payer: Medicare Other | Attending: Emergency Medicine | Admitting: Emergency Medicine

## 2014-05-18 ENCOUNTER — Encounter (HOSPITAL_COMMUNITY): Payer: Self-pay | Admitting: Emergency Medicine

## 2014-05-18 DIAGNOSIS — T82868A Thrombosis of vascular prosthetic devices, implants and grafts, initial encounter: Secondary | ICD-10-CM | POA: Insufficient documentation

## 2014-05-18 DIAGNOSIS — Z7901 Long term (current) use of anticoagulants: Secondary | ICD-10-CM | POA: Insufficient documentation

## 2014-05-18 DIAGNOSIS — Z792 Long term (current) use of antibiotics: Secondary | ICD-10-CM | POA: Insufficient documentation

## 2014-05-18 DIAGNOSIS — Z87891 Personal history of nicotine dependence: Secondary | ICD-10-CM | POA: Diagnosis not present

## 2014-05-18 DIAGNOSIS — F419 Anxiety disorder, unspecified: Secondary | ICD-10-CM | POA: Insufficient documentation

## 2014-05-18 DIAGNOSIS — E785 Hyperlipidemia, unspecified: Secondary | ICD-10-CM | POA: Insufficient documentation

## 2014-05-18 DIAGNOSIS — I252 Old myocardial infarction: Secondary | ICD-10-CM | POA: Insufficient documentation

## 2014-05-18 DIAGNOSIS — E119 Type 2 diabetes mellitus without complications: Secondary | ICD-10-CM | POA: Insufficient documentation

## 2014-05-18 DIAGNOSIS — I10 Essential (primary) hypertension: Secondary | ICD-10-CM | POA: Diagnosis not present

## 2014-05-18 DIAGNOSIS — Z79899 Other long term (current) drug therapy: Secondary | ICD-10-CM | POA: Insufficient documentation

## 2014-05-18 DIAGNOSIS — T82898A Other specified complication of vascular prosthetic devices, implants and grafts, initial encounter: Secondary | ICD-10-CM

## 2014-05-18 DIAGNOSIS — Y832 Surgical operation with anastomosis, bypass or graft as the cause of abnormal reaction of the patient, or of later complication, without mention of misadventure at the time of the procedure: Secondary | ICD-10-CM | POA: Insufficient documentation

## 2014-05-18 LAB — URINALYSIS, ROUTINE W REFLEX MICROSCOPIC
BILIRUBIN URINE: NEGATIVE
Glucose, UA: NEGATIVE mg/dL
Hgb urine dipstick: NEGATIVE
KETONES UR: NEGATIVE mg/dL
Leukocytes, UA: NEGATIVE
NITRITE: NEGATIVE
PH: 6 (ref 5.0–8.0)
Protein, ur: NEGATIVE mg/dL
Specific Gravity, Urine: 1.017 (ref 1.005–1.030)
Urobilinogen, UA: 0.2 mg/dL (ref 0.0–1.0)

## 2014-05-18 MED ORDER — HEPARIN SOD (PORK) LOCK FLUSH 100 UNIT/ML IV SOLN
250.0000 [IU] | INTRAVENOUS | Status: AC | PRN
  Administered 2014-05-18: 250 [IU]

## 2014-05-18 NOTE — Progress Notes (Addendum)
EDCM spoke to patient at bedside.  Patient recently discharged on 03/07 for septic knee joint.  Patient presents today with "clogged"  PICC line from home.  Patient is currently receiving home health services with [facility].  Patient has a visiting Therapist, sports for IV antibiotics, PT, OT and Education officer, museum.  Patient reports his daughter and her husband stay with him.  "My daughter give me my medication. "  Patient reports he has a walker, bedside commode, shower chair and wheelchair at home.  Patient reports the wheels fell off the wheelchair, and the one time he was brought to the hospital in his wheelchair, they left it here.  Patient reports his son was to pick it up, but he hasn't brought it to him yet.  Patient reports his pcp is Dr. Leonides Schanz but pill bottles on bedside table show Jennette Bill as prescriber.  Patient reports the last time he saw his pcp was, "the week before last."  Patient reports he cannot remember if he received a discharge follow up call post discharge from previous admission.  No further EDCM needs at this time.   05/18/2014 A. Dajanae Brophy RNCM 1830pm EDCM called and spoke to patent's daughter Caren Griffins.  Caren Griffins has confirmed that patient does have home health services with [facility].  Caren Griffins also reports patient has 24 hour private duty caregivers in the home.  EDCM asked Caren Griffins if she needs anything else for patient at home?  Caren Griffins reports no further needs at this time.  Caren Griffins also confirmed patient's wheelchair is missing from patient's last admission at [facility].  Caren Griffins reports they do have another one at home but would like to have the other one back as well.  EDCM will call to [facility] ED to see if patient's wheelchair is there.  No further EDCM needs at this time.

## 2014-05-18 NOTE — ED Notes (Signed)
Pt is up for d/c.  Called the number on chart [phone] and spoke to [place], his home health care giver who was given report and she's confirmed pt needs [facility] transport back home.  [facility] has been called for pickup.

## 2014-05-18 NOTE — ED Notes (Signed)
Per EMS pt comes from home due to PICC line not flushing when home health was trying to administer antibiotics for his right knee infection.  Pt had knee surgery couple weeks ago due to getting an infection in it even though pt had knee replacement surgery years ago.  Home Health RN told EMS that pt has been slightly altered by talking about his wife and finding two pans of gold.  She also reported that urine has foul odor.

## 2014-05-18 NOTE — ED Notes (Signed)
Bed: RESB Expected date:  Expected time:  Means of arrival:  Comments: EMS- PICC Line clogged

## 2014-05-18 NOTE — ED Provider Notes (Signed)
CSN: [id number]     Arrival date & time 05/18/14  1309 History   First MD Initiated Contact with Patient 05/18/14 1458     Chief Complaint  Patient presents with  . PICC line problems      (Consider location/radiation/quality/duration/timing/severity/associated sxs/prior Treatment) HPI  Patient was sent to the ED, because of a nonfunctional PICC line. He is receiving antibiotics, for a right knee infection, at home. Patient is a poor historian. Apparently home health nurse was with him today. Nurse reported to EMS that the patient was somewhat confused and had a foul order to his urine.  Level V Caveat- poor historian   Past Medical History  Diagnosis Date  . Diabetes mellitus without complication   . Anxiety   . MI (myocardial infarction)   . Hypertension   . Hyperlipidemia    Past Surgical History  Procedure Laterality Date  . Knee surgery    . Cholecystectomy    . Hemorroidectomy    . Pacemaker insertion    . Excisional total knee arthroplasty with antibiotic spacers Right 04/20/2014    Procedure: IRRIGATION AND DEBRIDMENT RIGHT TOTAL KNEE REMOVE  ACL IMPLANTED AND PLACE SPACER;  Surgeon: [name], MD;  Location: [facility];  Service: Orthopedics;  Laterality: Right;   Family History  Problem Relation Age of Onset  . Heart attack Mother   . Heart attack Father    History  Substance Use Topics  . Smoking status: Former Research scientist (life sciences)  . Smokeless tobacco: Never Used  . Alcohol Use: No    Review of Systems  Unable to perform ROS     Allergies  Review of patient's allergies indicates no known allergies.  Home Medications   Prior to Admission medications   Medication Sig Start Date End Date Taking? Authorizing Provider  antiseptic oral rinse (CPC / CETYLPYRIDINIUM CHLORIDE 0.05%) 0.05 % LIQD solution 7 mLs by Mouth Rinse route 2 (two) times daily. Patient taking differently: 15 mLs by Mouth Rinse route 2 (two) times daily as needed (toothache).  12/22/13  Yes Robbie Lis,  MD  atorvastatin (LIPITOR) 10 MG tablet Take 10 mg by mouth at bedtime.    Yes Historical Provider, MD  busPIRone (BUSPAR) 15 MG tablet Take 1 tablet (15 mg total) by mouth 2 (two) times daily. 01/08/14  Yes Shanker Kristeen Mans, MD  carvedilol (COREG) 3.125 MG tablet Take 3.125 mg by mouth 2 (two) times daily with a meal.   Yes Historical Provider, MD  clopidogrel (PLAVIX) 75 MG tablet Take 75 mg by mouth daily.   Yes Historical Provider, MD  Diaper Rash Products (A+D DIAPER RASH EX) Apply 1 application topically every 2 (two) hours as needed (diaper rash).   Yes Historical Provider, MD  ertapenem 1 g in sodium chloride 0.9 % 50 mL Inject 1 g into the vein daily. 04/23/14  Yes Karlene Einstein, MD  feeding supplement, ENSURE COMPLETE, (ENSURE COMPLETE) LIQD Take 237 mLs by mouth 2 (two) times daily between meals. Patient taking differently: Take 237 mLs by mouth 2 (two) times daily as needed (nutritional supplement).  12/22/13  Yes Robbie Lis, MD  Hydroactive Dressings (EXUDERM LP 4"X4") PADS Apply 1 each topically once a week.   Yes Historical Provider, MD  insulin NPH Human (HUMULIN N,NOVOLIN N) 100 UNIT/ML injection Inject 16 Units into the skin 2 (two) times daily before a meal. With breakfast and dinner   Yes Historical Provider, MD  miconazole (MICOTIN) 2 % powder Apply 1 application topically  daily as needed for itching.   Yes Historical Provider, MD  oxyCODONE-acetaminophen (ROXICET) 5-325 MG per tablet Take 1 tablet by mouth every 4 (four) hours as needed. Patient taking differently: Take 1 tablet by mouth every 4 (four) hours as needed for moderate pain or severe pain.  04/20/14  Yes Leighton Parody, PA-C  ranitidine (ZANTAC) 150 MG capsule Take 150 mg by mouth 2 (two) times daily.   Yes Historical Provider, MD  Skin Protectants, Misc. (EUCERIN) cream Apply 1 application topically as needed for dry skin.   Yes Historical Provider, MD  tamsulosin (FLOMAX) 0.4 MG CAPS capsule Take 1 capsule (0.4  mg total) by mouth daily. 12/22/13  Yes Robbie Lis, MD  tizanidine (ZANAFLEX) 2 MG capsule Take 1 capsule (2 mg total) by mouth 3 (three) times daily. 04/20/14  Yes Leighton Parody, PA-C  vancomycin 1,250 mg in sodium chloride 0.9 % 250 mL Inject 1,250 mg into the vein daily. 04/23/14  Yes Karlene Einstein, MD  warfarin (COUMADIN) 5 MG tablet Take 5 mg by mouth daily. Take 1 1/2 tablets ( 7.5 mg) on Tuesday and Saturday, take 1 tablet (5 mg) on Sunday, Monday, Wednesday, Thursday, Friday   Yes Historical Provider, MD   BP 133/51 mmHg  Pulse 73  Temp(Src) 97.7 F (36.5 C) (Oral)  Resp 20  SpO2 100% Physical Exam  Constitutional: He appears well-developed.  Elderly, frail  HENT:  Head: Normocephalic and atraumatic.  Right Ear: External ear normal.  Left Ear: External ear normal.  Eyes: Conjunctivae and EOM are normal. Pupils are equal, round, and reactive to light.  Neck: Normal range of motion and phonation normal. Neck supple.  Cardiovascular: Normal rate, regular rhythm and normal heart sounds.   PICC line in right anterior antecubital space, site and line appear normal.  Pulmonary/Chest: Effort normal and breath sounds normal. He exhibits no bony tenderness.  Abdominal: Soft. There is no tenderness.  Musculoskeletal:  Right knee swollen anteriorly with decreased range of motion secondary to pain. There is an overlying anterior knee wound, consistent with recent surgical procedure. There is no drainage, dehiscence or bleeding noted from the wound.  Neurological: He is alert. No cranial nerve deficit or sensory deficit. He exhibits normal muscle tone. Coordination normal.  Skin: Skin is warm, dry and intact.  Psychiatric: He has a normal mood and affect. His behavior is normal.  Nursing note and vitals reviewed.   ED Course  Procedures (including critical care time)  IV therapy team consulted, for PICC line treatment to improve flow.  Labs Review Labs Reviewed  URINALYSIS, ROUTINE  W REFLEX MICROSCOPIC - Abnormal; Notable for the following:    APPearance CLOUDY (*)    All other components within normal limits  URINE CULTURE    Imaging Review No results found.   EKG Interpretation None      MDM   Final diagnoses:  Occluded PICC line, initial encounter    PICC line was successfully cannulated, by IV therapy staff. Urinalysis returned, and is abnormal. The patient is currently on ertapenem, and vancomycin. His is very broad-spectrum coverage, it would be difficult to add anything additional, without awaiting urinary culture results. Patient is nontoxic, with normal vital signs. I doubt severe bacterial infection, metabolic instability or impending vascular collapse.  Nursing Notes Reviewed/ Care Coordinated Applicable Imaging Reviewed Interpretation of Laboratory Data incorporated into ED treatment  The patient appears reasonably screened and/or stabilized for discharge and I doubt any other medical condition or  other [place] requiring further screening, evaluation, or treatment in the ED at this time prior to discharge.  Plan: Home Medications- usual; Home Treatments- rest; return here if the recommended treatment, does not improve the symptoms; Recommended follow up- PCP 1 week fr check up    Daleen Bo, MD 05/19/14 [phone]

## 2014-05-18 NOTE — ED Notes (Signed)
Picc line has been heparinized by the IV RN.

## 2014-05-18 NOTE — Discharge Instructions (Signed)
PICC Home Guide A peripherally inserted central catheter (PICC) is a long, thin, flexible tube that is inserted into a vein in the upper arm. It is a form of intravenous (IV) access. It is considered to be a "central" line because the tip of the PICC ends in a large vein in your chest. This large vein is called the superior vena cava (SVC). The PICC tip ends in the SVC because there is a lot of blood flow in the SVC. This allows medicines and IV fluids to be quickly distributed throughout the body. The PICC is inserted using a sterile technique by a specially trained nurse or physician. After the PICC is inserted, a chest X-ray exam is done to be sure it is in the correct place.  A PICC may be placed for different reasons, such as:  To give medicines and liquid nutrition that can only be given through a central line. Examples are:  Certain antibiotic treatments.  Chemotherapy.  Total parenteral nutrition (TPN).  To take frequent blood samples.  To give IV fluids and blood products.  If there is difficulty placing a peripheral intravenous (PIV) catheter. If taken care of properly, a PICC can remain in place for several months. A PICC can also allow a person to go home from the hospital early. Medicine and PICC care can be managed at home by a family member or home health care team. WHAT PROBLEMS CAN HAPPEN WHEN I HAVE A PICC? Problems with a PICC can occasionally occur. These may include the following:  A blood clot (thrombus) forming in or at the tip of the PICC. This can cause the PICC to become clogged. A clot-dissolving medicine called tissue plasminogen activator (tPA) can be given through the PICC to help break up the clot.  Inflammation of the vein (phlebitis) in which the PICC is placed. Signs of inflammation may include redness, pain at the insertion site, red streaks, or being able to feel a "cord" in the vein where the PICC is located.  Infection in the PICC or at the insertion  site. Signs of infection may include fever, chills, redness, swelling, or pus drainage from the PICC insertion site.  PICC movement (malposition). The PICC tip may move from its original position due to excessive physical activity, forceful coughing, sneezing, or vomiting.  A break or cut in the PICC. It is important to not use scissors near the PICC.  Nerve or tendon irritation or injury during PICC insertion. WHAT SHOULD I KEEP IN MIND ABOUT ACTIVITIES WHEN I HAVE A PICC?  You may bend your arm and move it freely. If your PICC is near or at the bend of your elbow, avoid activity with repeated motion at the elbow.  Rest at home for the remainder of the day following PICC line insertion.  Avoid lifting heavy objects as instructed by your health care provider.  Avoid using a crutch with the arm on the same side as your PICC. You may need to use a walker. WHAT SHOULD I KNOW ABOUT MY PICC DRESSING?  Keep your PICC bandage (dressing) clean and dry to prevent infection.  Ask your health care provider when you may shower. Ask your health care provider to teach you how to wrap the PICC when you do take a shower.  Change the PICC dressing as instructed by your health care provider.  Change your PICC dressing if it becomes loose or wet. WHAT SHOULD I KNOW ABOUT PICC CARE?  Check the PICC insertion site   daily for leakage, redness, swelling, or pain.  Do not take a bath, swim, or use hot tubs when you have a PICC. Cover PICC line with clear plastic wrap and tape to keep it dry while showering.  Flush the PICC as directed by your health care provider. Let your health care provider know right away if the PICC is difficult to flush or does not flush. Do not use force to flush the PICC.  Do not use a syringe that is less than 10 mL to flush the PICC.  Never pull or tug on the PICC.  Avoid blood pressure checks on the arm with the PICC.  Keep your PICC identification card with you at all  times.  Do not take the PICC out yourself. Only a trained clinical professional should remove the PICC. SEEK IMMEDIATE MEDICAL CARE IF:  Your PICC is accidentally pulled all the way out. If this happens, cover the insertion site with a bandage or gauze dressing. Do not throw the PICC away. Your health care provider will need to inspect it.  Your PICC was tugged or pulled and has partially come out. Do not  push the PICC back in.  There is any type of drainage, redness, or swelling where the PICC enters the skin.  You cannot flush the PICC, it is difficult to flush, or the PICC leaks around the insertion site when it is flushed.  You hear a "flushing" sound when the PICC is flushed.  You have pain, discomfort, or numbness in your arm, shoulder, or jaw on the same side as the PICC.  You feel your heart "racing" or skipping beats.  You notice a hole or tear in the PICC.  You develop chills or a fever. MAKE SURE YOU:   Understand these instructions.  Will watch your condition.  Will get help right away if you are not doing well or get worse. Document Released: 08/09/2002 Document Revised: 06/19/2013 Document Reviewed: 10/10/2012 Exit[employer] Patient Information 2015 [employer]. This information is not intended to replace advice given to you by your health care provider. Make sure you discuss any questions you have with your health care provider.  

## 2014-05-20 LAB — URINE CULTURE
CULTURE: NO GROWTH
Colony Count: NO GROWTH
Special Requests: NORMAL

## 2014-05-24 ENCOUNTER — Telehealth: Payer: Self-pay | Admitting: *Deleted

## 2014-05-24 NOTE — Telephone Encounter (Signed)
Patient not scheduled until 4/12 for hospital follow up, last dose of iv antibiotics was 4/6.  Per Dr. Baxter Flattery, IV antibiotics/labs/PICC care should be continued through 4/16.  Per Dr. Baxter Flattery, ok to pull PICC 4/17.  RN relayed order to Coretta at [facility]. Landis Gandy, RN

## 2014-05-29 ENCOUNTER — Ambulatory Visit (INDEPENDENT_AMBULATORY_CARE_PROVIDER_SITE_OTHER): Payer: Medicare Other | Admitting: Internal Medicine

## 2014-05-29 ENCOUNTER — Inpatient Hospital Stay: Payer: Medicare Other | Admitting: Internal Medicine

## 2014-05-29 ENCOUNTER — Encounter: Payer: Self-pay | Admitting: Internal Medicine

## 2014-05-29 VITALS — BP 107/65 | Temp 98.7°F

## 2014-05-29 DIAGNOSIS — T8450XS Infection and inflammatory reaction due to unspecified internal joint prosthesis, sequela: Secondary | ICD-10-CM

## 2014-05-29 DIAGNOSIS — T8459XS Infection and inflammatory reaction due to other internal joint prosthesis, sequela: Secondary | ICD-10-CM

## 2014-05-29 DIAGNOSIS — Z96659 Presence of unspecified artificial knee joint: Principal | ICD-10-CM

## 2014-05-29 LAB — C-REACTIVE PROTEIN: CRP: 0.6 mg/dL — ABNORMAL HIGH (ref <0.60)

## 2014-05-29 LAB — BASIC METABOLIC PANEL
BUN: 20 mg/dL (ref 6–23)
CHLORIDE: 107 meq/L (ref 96–112)
CO2: 24 mEq/L (ref 19–32)
Calcium: 8.7 mg/dL (ref 8.4–10.5)
Creat: 1.22 mg/dL (ref 0.50–1.35)
Glucose, Bld: 124 mg/dL — ABNORMAL HIGH (ref 70–99)
Potassium: 4.2 mEq/L (ref 3.5–5.3)
Sodium: 141 mEq/L (ref 135–145)

## 2014-05-29 NOTE — Progress Notes (Signed)
Subjective:    Patient ID: Randy [name], male    DOB: 08/03/1924, 81 y.o.   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 He underwent HW removal of right knee and abtx spacer placement on 04/20/14. He was discharged on vancomycin and ertapenem for 6 wk. He presents today in followup. He is currently on 5th wk, 3day of treatment. He states that his right knee still feels warm, some discomfort. His health care attendant notices that it "creaks" when he moves his leg. No fever, chills, nightsweats. He remains wheelchair dependent. No diarrhea, no thrush. He has occasional "diaper rash" treated with desitan per healthcare attendant. He has not seen Dr. Mayer Camel since his discharge as of yet.  No Known Allergies  Current Outpatient Prescriptions on File Prior to Visit  Medication Sig Dispense Refill  . atorvastatin (LIPITOR) 10 MG tablet Take 10 mg by mouth at bedtime.     . busPIRone (BUSPAR) 15 MG tablet Take 1 tablet (15 mg total) by mouth 2 (two) times daily. 30 tablet 0  . carvedilol (COREG) 3.125 MG tablet Take 3.125 mg by mouth 2 (two) times daily with a meal.    . Diaper Rash Products (A+D DIAPER RASH EX) Apply 1 application topically every 2 (two) hours as needed (diaper rash).    . ertapenem 1 g in sodium chloride 0.9 % 50 mL Inject 1 g into the vein daily. 30 g 0  . feeding supplement, ENSURE COMPLETE, (ENSURE COMPLETE) LIQD Take 237 mLs by mouth 2 (two) times daily between meals. (Patient taking differently: Take 237 mLs by mouth 2 (two) times daily as needed (nutritional supplement). ) 237 mL 0  . Hydroactive Dressings (EXUDERM LP 4"X4") PADS Apply 1 each topically once a week.    . insulin NPH Human (HUMULIN N,NOVOLIN N) 100 UNIT/ML injection Inject 16 Units into the skin 2 (two) times daily before a meal. With breakfast and dinner    . miconazole (MICOTIN) 2 % powder Apply 1 application topically daily as needed for itching.    .  ranitidine (ZANTAC) 150 MG capsule Take 150 mg by mouth 2 (two) times daily.    . Skin Protectants, Misc. (EUCERIN) cream Apply 1 application topically as needed for dry skin.    . tamsulosin (FLOMAX) 0.4 MG CAPS capsule Take 1 capsule (0.4 mg total) by mouth daily. 30 capsule 0  . tizanidine (ZANAFLEX) 2 MG capsule Take 1 capsule (2 mg total) by mouth 3 (three) times daily. 60 capsule 0  . vancomycin 1,250 mg in sodium chloride 0.9 % 250 mL Inject 1,250 mg into the vein daily. 37500 mg 0  . warfarin (COUMADIN) 5 MG tablet Take 5 mg by mouth daily. Take 1 1/2 tablets ( 7.5 mg) on Tuesday and Saturday, take 1 tablet (5 mg) on Sunday, Monday, Wednesday, Thursday, Friday    . antiseptic oral rinse (CPC / CETYLPYRIDINIUM CHLORIDE 0.05%) 0.05 % LIQD solution 7 mLs by Mouth Rinse route 2 (two) times daily. (Patient not taking: Reported on 05/29/2014) 44 mL 0  . clopidogrel (PLAVIX) 75 MG tablet Take 75 mg by mouth daily.    Marland Kitchen oxyCODONE-acetaminophen (ROXICET) 5-325 MG per tablet Take 1 tablet by mouth every 4 (four) hours as needed. (Patient not taking: Reported on 05/29/2014) 60 tablet 0   No current facility-administered medications on file prior to visit.   Active Ambulatory Problems    Diagnosis Date Noted  . Hyperlipidemia   .  Hypertension   . Anxiety   . Acute UTI 12/20/2013  . History of MI (myocardial infarction) 12/20/2013  . UTI (lower urinary tract infection) 12/20/2013  . Essential hypertension   . Weakness generalized   . Elevated brain natriuretic peptide (BNP) level 12/20/2013  . Hyperglycemia 12/20/2013  . Acute kidney injury 12/20/2013  . Decubitus ulcer 12/20/2013  . History of DVT (deep vein thrombosis) 12/20/2013  . Diabetes mellitus 12/21/2013  . Sepsis 01/04/2014  . Diabetes mellitus without complication   . Pyelonephritis 03/11/2014  . Septic joint of right knee joint 04/19/2014  . Chronic kidney disease (CKD), stage III (moderate) 04/20/2014  . Infection of total  right knee replacement 04/20/2014  . History of infection due to ESBL Escherichia coli    Resolved Ambulatory Problems    Diagnosis Date Noted  . MI (myocardial infarction)   . Acute encephalopathy 01/04/2014  . Septic arthritis of knee, right 04/19/2014   No Additional Past Medical History     Review of Systems  10 point ros is negative except for right knee pain and occasional + confusion      Objective:   Physical Exam BP 107/65 mmHg  Temp(Src) 98.7 F (37.1 C) (Oral) Physical Exam  Constitutional: He is oriented to person, place, and time. He appears well-developed and well-nourished. No distress. Appears his stated age HENT:  Mouth/Throat: Oropharynx is clear and moist. No oropharyngeal exudate.  Cardiovascular: Normal rate, regular rhythm and normal heart sounds. Exam reveals no gallop and no friction rub.  No murmur heard.  Pulmonary/Chest: Effort normal and breath sounds normal. No respiratory distress. He has no wheezes.  Ext: right arm picc line in c/d/i. Right knee is warm> left knee. Erythematous, slight on right knee.no effusion. Surgical incision is well healed. Trace edema to right leg He has no cervical adenopathy.  Neurological: He is alert and oriented to person, place. He knows that [place] is not living at [place], [place]" but staying in [place] Skin: Skin is warm and dry. No rash noted. No erythema.     Lab Results  Component Value Date   ESRSEDRATE 23* 05/29/2014   Lab Results  Component Value Date   CRP 0.6* 05/29/2014   Lab Results  Component Value Date   CREATININE 1.22 05/29/2014       Assessment & Plan:  Prosthetic joint infection s/p HW removal and antibiotic spacer = due to poor baseline function, unlikely to have 2 staged revision per Dr. Mayer Camel. I am not sure if the patient knows what he can and can't do with his right knee. We will make sure patient has follow up visit with Dr. Mayer Camel.  We will check inflammatory markers today. It  appears that they are nearly normalized.  We will extend it out 8 wk of IV antibiotics, then place him on bactrim ds daily, but need to coordinate since it interacts with warfarin. See him back in 3 wk prior to transition to oral antibiotics

## 2014-05-30 LAB — SEDIMENTATION RATE: SED RATE: 23 mm/h — AB (ref 0–20)

## 2014-06-04 ENCOUNTER — Telehealth: Payer: Self-pay | Admitting: *Deleted

## 2014-06-04 ENCOUNTER — Telehealth: Payer: Self-pay

## 2014-06-04 NOTE — Telephone Encounter (Addendum)
Staff message received 4/18. [facility] already in touch with Orland Mustard, RN regarding orders.

## 2014-06-04 NOTE — Telephone Encounter (Signed)
-----   Message from Carlyle Basques, MD sent at 06/02/2014 12:31 PM EDT ----- Can we extend his ertapenem by 2 more weeks. We saw him last Tuesday. Then can pull out picc line

## 2014-06-04 NOTE — Telephone Encounter (Signed)
[facility] calling with  Questions regarding IV antibiotics. Patient told nurse he was to continue IV antibiotics beyond initial stop date.  Order needed.  No problem we will fax office note which sates he is to be extended for 3 weeks beyond initial date.    Note faxed. [phone]   12:20  Mary from [facility] called to confirm patients IV antibiotics. He was scheduled to be discontinued on April 16 th and with new order will be extended to Jun 25, 2014 with usual labs as per protocol. Patient has office visit scheduled for Jun 26, 2014 . Home health will leave PICC in place until office visit.    Laverle Patter, RN

## 2014-06-05 ENCOUNTER — Telehealth: Payer: Self-pay | Admitting: *Deleted

## 2014-06-05 NOTE — Telephone Encounter (Signed)
See note from Dr Annetta Maw 06/05/14

## 2014-06-05 NOTE — Telephone Encounter (Signed)
-----   Message from Carlyle Basques, MD sent at 06/02/2014 12:31 PM EDT ----- Can we extend his ertapenem by 2 more weeks. We saw him last Tuesday. Then can pull out picc line

## 2014-06-05 NOTE — Telephone Encounter (Signed)
Taken care of by Orland Mustard 06/04/14 see note.

## 2014-06-06 ENCOUNTER — Other Ambulatory Visit: Payer: Self-pay | Admitting: Orthopedic Surgery

## 2014-06-07 ENCOUNTER — Encounter (HOSPITAL_COMMUNITY)
Admission: RE | Admit: 2014-06-07 | Discharge: 2014-06-07 | Disposition: A | Discharge status: Home / Self Care | Payer: Medicare Other | Source: Ambulatory Visit | Attending: Orthopedic Surgery | Admitting: Orthopedic Surgery

## 2014-06-07 ENCOUNTER — Encounter (HOSPITAL_COMMUNITY): Payer: Self-pay

## 2014-06-07 ENCOUNTER — Other Ambulatory Visit: Payer: Self-pay | Admitting: Orthopedic Surgery

## 2014-06-07 DIAGNOSIS — Z79899 Other long term (current) drug therapy: Secondary | ICD-10-CM | POA: Insufficient documentation

## 2014-06-07 DIAGNOSIS — Z87891 Personal history of nicotine dependence: Secondary | ICD-10-CM | POA: Insufficient documentation

## 2014-06-07 DIAGNOSIS — E119 Type 2 diabetes mellitus without complications: Secondary | ICD-10-CM | POA: Diagnosis not present

## 2014-06-07 DIAGNOSIS — Z01818 Encounter for other preprocedural examination: Secondary | ICD-10-CM | POA: Diagnosis not present

## 2014-06-07 DIAGNOSIS — Z86718 Personal history of other venous thrombosis and embolism: Secondary | ICD-10-CM | POA: Diagnosis not present

## 2014-06-07 DIAGNOSIS — Z7901 Long term (current) use of anticoagulants: Secondary | ICD-10-CM | POA: Insufficient documentation

## 2014-06-07 DIAGNOSIS — Z7902 Long term (current) use of antithrombotics/antiplatelets: Secondary | ICD-10-CM | POA: Insufficient documentation

## 2014-06-07 DIAGNOSIS — Z950 Presence of cardiac pacemaker: Secondary | ICD-10-CM | POA: Diagnosis not present

## 2014-06-07 DIAGNOSIS — I2510 Atherosclerotic heart disease of native coronary artery without angina pectoris: Secondary | ICD-10-CM | POA: Diagnosis not present

## 2014-06-07 DIAGNOSIS — Z794 Long term (current) use of insulin: Secondary | ICD-10-CM | POA: Insufficient documentation

## 2014-06-07 DIAGNOSIS — Z01812 Encounter for preprocedural laboratory examination: Secondary | ICD-10-CM | POA: Insufficient documentation

## 2014-06-07 DIAGNOSIS — Z0183 Encounter for blood typing: Secondary | ICD-10-CM | POA: Diagnosis not present

## 2014-06-07 DIAGNOSIS — Z955 Presence of coronary angioplasty implant and graft: Secondary | ICD-10-CM | POA: Diagnosis not present

## 2014-06-07 DIAGNOSIS — I129 Hypertensive chronic kidney disease with stage 1 through stage 4 chronic kidney disease, or unspecified chronic kidney disease: Secondary | ICD-10-CM | POA: Insufficient documentation

## 2014-06-07 DIAGNOSIS — N183 Chronic kidney disease, stage 3 (moderate): Secondary | ICD-10-CM | POA: Insufficient documentation

## 2014-06-07 DIAGNOSIS — I480 Paroxysmal atrial fibrillation: Secondary | ICD-10-CM | POA: Insufficient documentation

## 2014-06-07 DIAGNOSIS — I252 Old myocardial infarction: Secondary | ICD-10-CM | POA: Diagnosis not present

## 2014-06-07 HISTORY — DX: Unspecified osteoarthritis, unspecified site: M19.90

## 2014-06-07 HISTORY — DX: Atherosclerotic heart disease of native coronary artery without angina pectoris: I25.10

## 2014-06-07 HISTORY — DX: Presence of cardiac pacemaker: Z95.0

## 2014-06-07 HISTORY — DX: Cardiac arrhythmia, unspecified: I49.9

## 2014-06-07 HISTORY — DX: Chronic kidney disease, unspecified: N18.9

## 2014-06-07 LAB — CBC WITH DIFFERENTIAL/PLATELET
BASOS PCT: 1 % (ref 0–1)
Basophils Absolute: 0.1 10*3/uL (ref 0.0–0.1)
EOS PCT: 6 % — AB (ref 0–5)
Eosinophils Absolute: 0.5 10*3/uL (ref 0.0–0.7)
HEMATOCRIT: 38.5 % — AB (ref 39.0–52.0)
HEMOGLOBIN: 12.2 g/dL — AB (ref 13.0–17.0)
Lymphocytes Relative: 24 % (ref 12–46)
Lymphs Abs: 1.8 10*3/uL (ref 0.7–4.0)
MCH: 27.4 pg (ref 26.0–34.0)
MCHC: 31.7 g/dL (ref 30.0–36.0)
MCV: 86.3 fL (ref 78.0–100.0)
MONO ABS: 0.6 10*3/uL (ref 0.1–1.0)
Monocytes Relative: 8 % (ref 3–12)
Neutro Abs: 4.7 10*3/uL (ref 1.7–7.7)
Neutrophils Relative %: 61 % (ref 43–77)
Platelets: 376 10*3/uL (ref 150–400)
RBC: 4.46 MIL/uL (ref 4.22–5.81)
RDW: 15.9 % — AB (ref 11.5–15.5)
WBC: 7.6 10*3/uL (ref 4.0–10.5)

## 2014-06-07 LAB — BASIC METABOLIC PANEL
ANION GAP: 10 (ref 5–15)
BUN: 23 mg/dL (ref 6–23)
CHLORIDE: 107 mmol/L (ref 96–112)
CO2: 23 mmol/L (ref 19–32)
Calcium: 9 mg/dL (ref 8.4–10.5)
Creatinine, Ser: 1.58 mg/dL — ABNORMAL HIGH (ref 0.50–1.35)
GFR calc non Af Amer: 37 mL/min — ABNORMAL LOW (ref >90)
GFR, EST AFRICAN AMERICAN: 43 mL/min — AB (ref >90)
Glucose, Bld: 193 mg/dL — ABNORMAL HIGH (ref 70–99)
Potassium: 4.2 mmol/L (ref 3.5–5.1)
SODIUM: 140 mmol/L (ref 135–145)

## 2014-06-07 LAB — ABO/RH: ABO/RH(D): B NEG

## 2014-06-07 LAB — APTT: APTT: 39 s — AB (ref 24–37)

## 2014-06-07 LAB — SURGICAL PCR SCREEN
MRSA, PCR: POSITIVE — AB
Staphylococcus aureus: POSITIVE — AB

## 2014-06-07 LAB — PROTIME-INR
INR: 1.94 — AB (ref 0.00–1.49)
PROTHROMBIN TIME: 22.3 s — AB (ref 11.6–15.2)

## 2014-06-07 NOTE — Progress Notes (Signed)
Call to Dr. Damita Dunnings office, spoke with Jaclyn Shaggy, order rec'd to hold coumadin starting now & continue plavix.

## 2014-06-07 NOTE — Progress Notes (Signed)
Call to phone- for pt.'s (aide).  Daughter answered but she is disabled & cannot give me the phone no. For the aide who is in charge of his medicines.

## 2014-06-07 NOTE — Progress Notes (Signed)
Call to pharmacy tech for med. Reconciliation.

## 2014-06-07 NOTE — Progress Notes (Signed)
Call to request orders.

## 2014-06-07 NOTE — Progress Notes (Signed)
Call to Dr. Marya Fossa office, spoke with Hassan Rowan, she asks that I fax our request for device order & she will return it with his last OV note.

## 2014-06-07 NOTE — Progress Notes (Signed)
PT. DOESN'T KNOW WHY HE TAKES COUMADIN & PLAVIX, HE IS A POOR HISTORIAN . HE IS ACCOMPANIED BY AILEEN, A PRIVATE AIDE TODAY. HE HAS 24/7 HELP AT HOME. AILEEN IS HELPFUL IN GIVING HIS MEDICINE SCHEDULE. PT. ALSO IS NOT AWARE OF WHO HE HAS SEEN FOR PACEMAKER /CARDIAC CARE.

## 2014-06-07 NOTE — Progress Notes (Signed)
Reached private aide for Randy [name], instructions given to her ( Roslyn- evening care giver) to hold coumadin, continue Plavix . Pt. Repeated the instructions & states she will give this information to the a.m. Caregiver.

## 2014-06-07 NOTE — Pre-Procedure Instructions (Signed)
Randy [name]  06/07/2014   Your procedure is scheduled on:  06/11/2014  Report to [facility] Admitting   ENTRANCE  A at 10:30 AM.  Call this number if you have problems the morning of surgery: [phone]   Remember:   Do not eat food or drink liquids after midnight. On SUNDAY   Take these medicines the morning of surgery with A SIP OF WATER: Carvedilol, Buspar, Zantac, Tamsulosin, Tizanidine    Do not wear jewelry   Do not wear lotions, powders, or perfumes. You may wear deodorant.    Men may shave face and neck.   Do not bring valuables to the hospital.  [facility] is not responsible                  for any belongings or valuables.               Contacts, dentures or bridgework may not be worn into surgery.   Leave suitcase in the car. After surgery it may be brought to your room.   For patients admitted to the hospital, discharge time is determined by your                treatment team.               Patients discharged the day of surgery will not be allowed to drive  home.  Name and phone number of your driver: /w SON- Eric   Special Instructions: Special Instructions: [facility] - Preparing for Surgery  Before surgery, you can play an important role.  Because skin is not sterile, your skin needs to be as free of germs as possible.  You can reduce the number of germs on you skin by washing with CHG (chlorahexidine gluconate) soap before surgery.  CHG is an antiseptic cleaner which kills germs and bonds with the skin to continue killing germs even after washing.  Please DO NOT use if you have an allergy to CHG or antibacterial soaps.  If your skin becomes reddened/irritated stop using the CHG and inform your nurse when you arrive at Short Stay.  Do not shave (including legs and underarms) for at least 48 hours prior to the first CHG shower.  You may shave your face.  Please follow these instructions carefully:   1.  Shower with CHG Soap the night before  surgery and the  morning of Surgery.  2.  If you choose to wash your hair, wash your hair first as usual with your  normal shampoo.  3.  After you shampoo, rinse your hair and body thoroughly to remove the  Shampoo.  4.  Use CHG as you would any other liquid soap.  You can apply chg directly to the skin and wash gently with scrungie or a clean washcloth.  5.  Apply the CHG Soap to your body ONLY FROM THE NECK DOWN.    Do not use on open wounds or open sores.  Avoid contact with your eyes, ears, mouth and genitals (private parts).  Wash genitals (private parts)   with your normal soap.  6.  Wash thoroughly, paying special attention to the area where your surgery will be performed.  7.  Thoroughly rinse your body with warm water from the neck down.  8.  DO NOT shower/wash with your normal soap after using and rinsing off   the CHG Soap.  9.  Pat yourself dry with a clean towel.  10.  Wear clean pajamas.            11.  Place clean sheets on your bed the night of your first shower and do not sleep with pets.  Day of Surgery  Do not apply any lotions/deodorants the morning of surgery.  Please wear clean clothes to the hospital/surgery center.   Please read over the following fact sheets that you were given: Pain Booklet, Coughing and Deep Breathing, Blood Transfusion Information, MRSA Information and Surgical Site Infection Prevention

## 2014-06-07 NOTE — Progress Notes (Signed)
Call in effort to furnish Coumadin instructions to pt. Aide, line is busy.

## 2014-06-07 NOTE — Progress Notes (Signed)
Call to PCP- Rella Larve, [facility] office- asked for records on this pt., spoke with Josie. She also furnished the name of the cardiologist- Dr. Marya Fossa in [place]. (pt. Was not aware or remembered that he had a cardiologist)

## 2014-06-08 ENCOUNTER — Encounter (HOSPITAL_COMMUNITY): Payer: Self-pay

## 2014-06-08 DIAGNOSIS — Z89529 Acquired absence of unspecified knee: Secondary | ICD-10-CM

## 2014-06-08 NOTE — Progress Notes (Signed)
Spoke with patient's son [name] and instructed on arrival time of 945 am 06/11/14.

## 2014-06-08 NOTE — H&P (Signed)
TOTAL KNEE REVISION ADMISSION H&P  Patient is being admitted for right revision total knee arthroplasty.  Subjective:  Chief Complaint:right knee pain.  HPI: Randy [name], 81 y.o. male, has a history of pain and functional disability in the right knee(s) due to Status post-radical irrigation and debridement of an infected right AMK total knee with placement of PMMA spacer 6 weeks ago the tibia has now subluxed 50% in front of the spacer.   and patient has failed non-surgical conservative treatments for greater than 12 weeks to include NSAID's and/or analgesics, use of assistive devices and activity modification. The indications for the revision of the total knee arthroplasty are history of total knee infection and Status post-radical irrigation and debridement of an infected right AMK total knee with placement of PMMA spacer 6 weeks ago the tibia has now subluxed 50% in front of the spacer.  . Onset of symptoms was abrupt starting 1 years ago with rapidlly worsening course since that time.  Prior procedures on the right knee(s) include arthroplasty.  Patient currently rates pain in the right knee(s) at 10 out of 10 with activity. There is night pain, worsening of pain with activity and weight bearing, pain that interferes with activities of daily living and pain with passive range of motion.  Patient has evidence of PMMA spacer 6 weeks ago the tibia has now subluxed 50% in front of the spacer.  by imaging studies. This condition presents safety issues increasing the risk of falls.  There is no current active infection.  Patient Active Problem List   Diagnosis Date Noted  . History of infection due to ESBL Escherichia coli   . Chronic kidney disease (CKD), stage III (moderate) 04/20/2014  . Infection of total right knee replacement 04/20/2014  . Septic joint of right knee joint 04/19/2014  . Pyelonephritis 03/11/2014  . Sepsis 01/04/2014  . Diabetes mellitus without complication   . Diabetes  mellitus 12/21/2013  . Acute UTI 12/20/2013  . History of MI (myocardial infarction) 12/20/2013  . UTI (lower urinary tract infection) 12/20/2013  . Elevated brain natriuretic peptide (BNP) level 12/20/2013  . Hyperglycemia 12/20/2013  . Acute kidney injury 12/20/2013  . Decubitus ulcer 12/20/2013  . History of DVT (deep vein thrombosis) 12/20/2013  . Hyperlipidemia   . Hypertension   . Anxiety   . Essential hypertension   . Weakness generalized    Past Medical History  Diagnosis Date  . Diabetes mellitus without complication   . Anxiety   . Hypertension   . Hyperlipidemia   . Presence of permanent cardiac pacemaker   . MI (myocardial infarction) 2014  . Arthritis     R knee,     Past Surgical History  Procedure Laterality Date  . Knee surgery  2006  . Cholecystectomy    . Hemorroidectomy    . Pacemaker insertion    . Excisional total knee arthroplasty with antibiotic spacers Right 04/20/2014    Procedure: IRRIGATION AND DEBRIDMENT RIGHT TOTAL KNEE REMOVE  ACL IMPLANTED AND PLACE SPACER;  Surgeon: [name], MD;  Location: [facility];  Service: Orthopedics;  Laterality: Right;  . Picc line place peripheral ([facility] hx)  04/2014    R upper arm     No prescriptions prior to admission   No Known Allergies  History  Substance Use Topics  . Smoking status: Former Smoker    Quit date: 06/07/1983  . Smokeless tobacco: Never Used  . Alcohol Use: No    Family History  Problem  Relation Age of Onset  . Heart attack Mother   . Heart attack Father       Review of Systems  Constitutional: Negative.   HENT: Negative.   Eyes: Positive for blurred vision.  Respiratory: Negative.   Cardiovascular: Positive for leg swelling.       CAD with hx of blood clots and heart attack  Gastrointestinal: Negative.   Genitourinary:       Poor bladder control  Musculoskeletal: Positive for myalgias and joint pain.  Skin: Negative.   Neurological: Positive for focal weakness.       Poor motor  control  Endo/Heme/Allergies: Bruises/bleeds easily.       Diabetes  Psychiatric/Behavioral: The patient is nervous/anxious.      Objective:  Physical Exam  Constitutional: He appears well-developed and well-nourished.  HENT:  Head: Normocephalic and atraumatic.  Eyes: Pupils are equal, round, and reactive to light.  Neck: Neck supple.  Cardiovascular: Intact distal pulses.   Respiratory: Effort normal.  Musculoskeletal: He exhibits tenderness.  wound is well-healed there is no effusion, no swelling and no erythema.  The knee is bent about 40.  At the end of the procedure was done 15, which was standard.  He moves his toes well.  They're pink and well perfused normal sensation of the foot.  Neurological: He is alert.  Skin: Skin is warm and dry.    Vital signs in last 24 hours: Temp:  [98.5 F (36.9 C)] 98.5 F (36.9 C) (04/21 1518) Pulse Rate:  [80] 80 (04/21 1518) Resp:  [18] 18 (04/21 1518) BP: (118)/(51) 118/51 mmHg (04/21 1518) SpO2:  [98 %] 98 % (04/21 1518)  Labs:  Estimated body mass index is 32.28 kg/(m^2) as calculated from the following:   Height as of 03/11/14: 5\' 7"  (1.702 m).   Weight as of 04/19/14: 93.498 kg (206 lb 2 oz).  Imaging Review Plain radiographs demonstrate AP and lateral x-rays show that the tibia has subluxed 50% behind the spacer and the intramedullary portion of the spacer and the tibial side is now on the anterior lip of the tibia.  Assessment/Plan:  Status post-radical irrigation and debridement of an infected right AMK total knee with placement of PMMA spacer 6 weeks ago the tibia has now subluxed 50% in front of the spacer.  There is no evidence of recurrent infection.  The skin itself looks in good condition.  The patient history, physical examination, clinical judgment of the provider and imaging studies are consistent with end stage degenerative joint disease of the right knee(s), previous total knee arthroplasty. Revision total knee  arthroplasty is deemed medically necessary. The treatment options including medical management, injection therapy, arthroscopy and revision arthroplasty were discussed at length. The risks and benefits of revision total knee arthroplasty were presented and reviewed. The risks due to aseptic loosening, infection, stiffness, patella tracking problems, thromboembolic complications and other imponderables were discussed. The patient acknowledged the explanation, agreed to proceed with the plan and consent was signed. Patient is being admitted for inpatient treatment for surgery, pain control, PT, OT, prophylactic antibiotics, VTE prophylaxis, progressive ambulation and ADL's and discharge planning.The patient is planning to be discharged home with home health services

## 2014-06-08 NOTE — Progress Notes (Signed)
Anesthesia Chart Review: Patient is a 81 year old male scheduled for removal of right knee spacer and replacement of right total knee on 06/11/14 by Dr. Mayer Camel.  He recently resided in [place], [place], but presented to [facility] on 04/18/14 with an infected right TKA believed related to a recent E. Coli UTI/bacteremia.  He underwent radical I&D with removal of implants and placement of antibiotic impregnated PMMA spacer on 04/20/14.   Son is Jaishaun Mcnab, phone number [phone].  Primary caregiver in [place] is Rose.  History is significant for CAD/MI s/p LAD stent 10/2011 and RCA DES 03/2013 (following v-fib arrest 03/26/13 treated with brief CPR and one 150J shock), PAF, SSS s/p PPM (left chest; [employer], last check 11/14/13), BLE DVT, HTN, HLD, DM2, CKD stage III, anxiety, dementia, arthritis, remote former smoker, right TKA '10. He has a RUE PICC.   ID is Dr. Carlyle Basques. Last seen at [facility] 05/08/14 by Jennette Bill, PA-C/Dr. Rella Larve [phone]). Cardiologist is Dr. Mayra Neer in [place], [place] [phone]).  Meds include Lipitor, Buspar, Plavix, Roxicet, Zantac, Flomax, Zanaflex, vancomycin, warfarin, Humulin NPH, Coreg.  Per Dr. Damita Dunnings office, patient to hold Coumadin starting 06/07/14 but continue Plavix.  01/04/14 Echo:  - Procedure narrative: Transthoracic echocardiography. Intravenous contrast (Definity) was administered. Technically difficult study with poor echocardiographic windows. - Left ventricle: The cavity size was normal. Wall thickness was increased in a pattern of moderate LVH. Systolic function was normal. The estimated ejection fraction was in the range of 55% to 60%. Doppler parameters are consistent with abnormal left ventricular relaxation (grade 1 diastolic dysfunction). The E/e&' ratio is between 8-15, suggesting indeterminate LV fillingpressure. - Aortic valve: Trileaflet; mildly calcified leaflets. Very mild stenosis. Peak and mean gradients of 17  and 11 mmHg. Based on an LVOT diameter of 2.2 cm, the calculated AVA is around 2-2.1 cm2. There was trivial regurgitation. Valve area (VTI): 2.18 cm^2Valve area (Vmax): 2.08 cm^2. - Mitral valve: Calcified annulus. - Left atrium: The atrium was mildly dilated. - Tricuspid valve: There was moderate regurgitation. - Pulmonary arteries: PA peak pressure: 56 mm Hg (S). Impressions: Technically difficult study, contrast did not improve images much. LVEF 55-60%, moderate LVH, diastolic dysfunction, indeterminate LV filling pressure, moderate TR, RVSP 56 mmHg, Right-sided structures and IVC not well visualized.  03/26/13 Cardiac cath (per discharge summary received from his cardiologist): LM patent, Mid LAD at prior stent sie is patent. 20% lesion. LCX minor luminal irregularities. RCA 95% de novo mild lesion s/p Premier DES. (EF at that time was 45-50% with basal mid inferior and inferior lateral hypokinesis by echo.) 01/03/14 EKG: V-paced rhythm, borderline prolonged QT interval, negative T wave in inferior leads III and aVF and lateral leads V4-6, consider repolarization abnormality or ischemia.   04/19/14 CXR: FINDINGS: Cardiomegaly again noted. Chronic elevation of the right hemidiaphragm. No acute infiltrate or pleural effusion. No pulmonary edema. Dual lead cardiac pacemaker with leads in right atrium and right ventricle. Osteopenia and mild degenerative changes thoracic spine. IMPRESSION: No active disease. Dual lead cardiac pacemaker in place.  Preoperative labs noted. Cr 1.58. Glucose 193. H/H 12.2/38.5. PT/INR 22.3/1.94.  PTT 39. T&S done.  Recheck PT/INR on the day of surgery.  Dr. Marya Fossa is out of the office yesterday and today, so his nurse Larene Beach says our PPM perioperative RX form can not be completed prior to 06/11/14.    I updated anesthesiologist Dr. Tobias Salo.  Further anesthesiologist evaluation on the day of surgery.  Myra Gianotti, PA-C [facility]/Anesthesiology Phone  [phone] 06/08/2014 3:30 PM

## 2014-06-10 MED ORDER — DEXTROSE-NACL 5-0.45 % IV SOLN
INTRAVENOUS | Status: DC
  Administered 2014-06-11: 17:00:00 via INTRAVENOUS

## 2014-06-10 MED ORDER — CHLORHEXIDINE GLUCONATE 4 % EX LIQD
60.0000 mL | Freq: Once | CUTANEOUS | Status: DC
  Filled 2014-06-10: qty 60

## 2014-06-10 MED ORDER — CEFAZOLIN SODIUM-DEXTROSE 2-3 GM-% IV SOLR
2.0000 g | INTRAVENOUS | Status: AC
  Administered 2014-06-11: 2 g via INTRAVENOUS
  Filled 2014-06-10: qty 50

## 2014-06-11 ENCOUNTER — Inpatient Hospital Stay (HOSPITAL_COMMUNITY)
Admission: RE | Admit: 2014-06-11 | Discharge: 2014-06-15 | DRG: 470 | Disposition: A | Discharge status: Home Health | Payer: Medicare Other | Source: Ambulatory Visit | Attending: Orthopedic Surgery | Admitting: Orthopedic Surgery

## 2014-06-11 ENCOUNTER — Encounter (HOSPITAL_COMMUNITY)
Admission: RE | Disposition: A | Discharge status: Home Health | Payer: Self-pay | Source: Ambulatory Visit | Attending: Orthopedic Surgery

## 2014-06-11 ENCOUNTER — Inpatient Hospital Stay (HOSPITAL_COMMUNITY): Payer: Medicare Other | Admitting: Anesthesiology

## 2014-06-11 ENCOUNTER — Inpatient Hospital Stay (HOSPITAL_COMMUNITY): Payer: Medicare Other | Admitting: Emergency Medicine

## 2014-06-11 ENCOUNTER — Encounter (HOSPITAL_COMMUNITY): Payer: Self-pay | Admitting: *Deleted

## 2014-06-11 DIAGNOSIS — Z8249 Family history of ischemic heart disease and other diseases of the circulatory system: Secondary | ICD-10-CM

## 2014-06-11 DIAGNOSIS — I129 Hypertensive chronic kidney disease with stage 1 through stage 4 chronic kidney disease, or unspecified chronic kidney disease: Secondary | ICD-10-CM | POA: Diagnosis present

## 2014-06-11 DIAGNOSIS — Z79899 Other long term (current) drug therapy: Secondary | ICD-10-CM | POA: Diagnosis not present

## 2014-06-11 DIAGNOSIS — Z7901 Long term (current) use of anticoagulants: Secondary | ICD-10-CM | POA: Diagnosis not present

## 2014-06-11 DIAGNOSIS — E119 Type 2 diabetes mellitus without complications: Secondary | ICD-10-CM | POA: Diagnosis present

## 2014-06-11 DIAGNOSIS — Z794 Long term (current) use of insulin: Secondary | ICD-10-CM

## 2014-06-11 DIAGNOSIS — F0390 Unspecified dementia without behavioral disturbance: Secondary | ICD-10-CM | POA: Diagnosis present

## 2014-06-11 DIAGNOSIS — Z87891 Personal history of nicotine dependence: Secondary | ICD-10-CM

## 2014-06-11 DIAGNOSIS — E785 Hyperlipidemia, unspecified: Secondary | ICD-10-CM | POA: Diagnosis present

## 2014-06-11 DIAGNOSIS — Z950 Presence of cardiac pacemaker: Secondary | ICD-10-CM | POA: Diagnosis not present

## 2014-06-11 DIAGNOSIS — Z86718 Personal history of other venous thrombosis and embolism: Secondary | ICD-10-CM

## 2014-06-11 DIAGNOSIS — Z7902 Long term (current) use of antithrombotics/antiplatelets: Secondary | ICD-10-CM | POA: Diagnosis not present

## 2014-06-11 DIAGNOSIS — N183 Chronic kidney disease, stage 3 (moderate): Secondary | ICD-10-CM | POA: Diagnosis present

## 2014-06-11 DIAGNOSIS — I252 Old myocardial infarction: Secondary | ICD-10-CM | POA: Diagnosis not present

## 2014-06-11 DIAGNOSIS — D62 Acute posthemorrhagic anemia: Secondary | ICD-10-CM | POA: Diagnosis not present

## 2014-06-11 DIAGNOSIS — M25561 Pain in right knee: Principal | ICD-10-CM | POA: Diagnosis present

## 2014-06-11 DIAGNOSIS — Z96659 Presence of unspecified artificial knee joint: Secondary | ICD-10-CM

## 2014-06-11 DIAGNOSIS — I2510 Atherosclerotic heart disease of native coronary artery without angina pectoris: Secondary | ICD-10-CM | POA: Diagnosis present

## 2014-06-11 DIAGNOSIS — Z96651 Presence of right artificial knee joint: Secondary | ICD-10-CM

## 2014-06-11 DIAGNOSIS — Z89529 Acquired absence of unspecified knee: Secondary | ICD-10-CM

## 2014-06-11 HISTORY — PX: TOTAL KNEE ARTHROPLASTY WITH REVISION COMPONENTS: SHX6198

## 2014-06-11 LAB — GLUCOSE, CAPILLARY
GLUCOSE-CAPILLARY: 76 mg/dL (ref 70–99)
GLUCOSE-CAPILLARY: 82 mg/dL (ref 70–99)
GLUCOSE-CAPILLARY: 128 mg/dL — AB (ref 70–99)
Glucose-Capillary: 68 mg/dL — ABNORMAL LOW (ref 70–99)
Glucose-Capillary: 130 mg/dL — ABNORMAL HIGH (ref 70–99)
Glucose-Capillary: 59 mg/dL — ABNORMAL LOW (ref 70–99)
Glucose-Capillary: 66 mg/dL — ABNORMAL LOW (ref 70–99)
Glucose-Capillary: 174 mg/dL — ABNORMAL HIGH (ref 70–99)

## 2014-06-11 LAB — TYPE AND SCREEN
ABO/RH(D): B NEG
ABO/RH(D): B NEG
ANTIBODY SCREEN: NEGATIVE
ANTIBODY SCREEN: NEGATIVE

## 2014-06-11 LAB — PROTIME-INR
INR: 1.66 — AB (ref 0.00–1.49)
Prothrombin Time: 19.8 seconds — ABNORMAL HIGH (ref 11.6–15.2)

## 2014-06-11 LAB — GRAM STAIN: GRAM STAIN: NONE SEEN

## 2014-06-11 SURGERY — TOTAL KNEE ARTHROPLASTY WITH REVISION COMPONENTS
Anesthesia: General | Site: Knee | Laterality: Right

## 2014-06-11 MED ORDER — DOCUSATE SODIUM 100 MG PO CAPS
100.0000 mg | ORAL_CAPSULE | Freq: Two times a day (BID) | ORAL | Status: DC
  Administered 2014-06-11 – 2014-06-15 (×8): 100 mg via ORAL
  Filled 2014-06-11 (×8): qty 1

## 2014-06-11 MED ORDER — BUPIVACAINE LIPOSOME 1.3 % IJ SUSP
INTRAMUSCULAR | Status: DC | PRN
  Administered 2014-06-11: 20 mL

## 2014-06-11 MED ORDER — FENTANYL CITRATE (PF) 100 MCG/2ML IJ SOLN
INTRAMUSCULAR | Status: DC
Start: 2014-06-11 — End: 2014-06-11
  Filled 2014-06-11: qty 2

## 2014-06-11 MED ORDER — MIDAZOLAM HCL 2 MG/2ML IJ SOLN
INTRAMUSCULAR | Status: AC
  Filled 2014-06-11: qty 2

## 2014-06-11 MED ORDER — CEFUROXIME SODIUM 1.5 G IJ SOLR
INTRAMUSCULAR | Status: DC | PRN
  Administered 2014-06-11: 1.5 g

## 2014-06-11 MED ORDER — DEXTROSE 50 % IV SOLN
25.0000 mL | Freq: Once | INTRAVENOUS | Status: AC
  Administered 2014-06-11: 25 mL via INTRAVENOUS

## 2014-06-11 MED ORDER — BUPIVACAINE-EPINEPHRINE (PF) 0.5% -1:200000 IJ SOLN
INTRAMUSCULAR | Status: AC
  Filled 2014-06-11: qty 30

## 2014-06-11 MED ORDER — DEXTROSE 50 % IV SOLN
INTRAVENOUS | Status: AC
  Administered 2014-06-11: 25 mL
  Filled 2014-06-11: qty 50

## 2014-06-11 MED ORDER — WARFARIN SODIUM 5 MG PO TABS
5.0000 mg | ORAL_TABLET | Freq: Once | ORAL | Status: AC
  Administered 2014-06-11: 5 mg via ORAL
  Filled 2014-06-11: qty 1

## 2014-06-11 MED ORDER — ONDANSETRON HCL 4 MG/2ML IJ SOLN: 4.0000 mg | Freq: Four times a day (QID) | INTRAMUSCULAR | Status: DC | PRN

## 2014-06-11 MED ORDER — KCL IN DEXTROSE-NACL 20-5-0.45 MEQ/L-%-% IV SOLN
INTRAVENOUS | Status: DC
  Administered 2014-06-12 – 2014-06-13 (×3): via INTRAVENOUS
  Filled 2014-06-11 (×14): qty 1000

## 2014-06-11 MED ORDER — TRANEXAMIC ACID 1000 MG/10ML IV SOLN
2000.0000 mg | Freq: Once | INTRAVENOUS | Status: AC
  Administered 2014-06-11: 2000 mg via TOPICAL
  Filled 2014-06-11 (×2): qty 20

## 2014-06-11 MED ORDER — SODIUM CHLORIDE 0.9 % IR SOLN
Status: DC | PRN
  Administered 2014-06-11: 1000 mL

## 2014-06-11 MED ORDER — VANCOMYCIN HCL 10 G IV SOLR
1250.0000 mg | INTRAVENOUS | Status: DC
  Administered 2014-06-11 – 2014-06-14 (×4): 1250 mg via INTRAVENOUS
  Filled 2014-06-11 (×5): qty 1250

## 2014-06-11 MED ORDER — WARFARIN - PHARMACIST DOSING INPATIENT
Freq: Every day | Status: DC
  Administered 2014-06-12: 18:00:00

## 2014-06-11 MED ORDER — BUSPIRONE HCL 5 MG PO TABS
15.0000 mg | ORAL_TABLET | Freq: Two times a day (BID) | ORAL | Status: DC
  Administered 2014-06-12 – 2014-06-14 (×6): 15 mg via ORAL
  Filled 2014-06-11 (×14): qty 1

## 2014-06-11 MED ORDER — HYDROMORPHONE HCL 1 MG/ML IJ SOLN
0.2500 mg | INTRAMUSCULAR | Status: DC | PRN
  Administered 2014-06-11 (×8): 0.25 mg via INTRAVENOUS

## 2014-06-11 MED ORDER — PROPOFOL 10 MG/ML IV BOLUS
INTRAVENOUS | Status: AC
  Filled 2014-06-11: qty 20

## 2014-06-11 MED ORDER — LACTATED RINGERS IV SOLN
INTRAVENOUS | Status: DC
  Administered 2014-06-11 (×2): via INTRAVENOUS

## 2014-06-11 MED ORDER — DIPHENHYDRAMINE HCL 12.5 MG/5ML PO ELIX: 12.5000 mg | ORAL_SOLUTION | ORAL | Status: DC | PRN

## 2014-06-11 MED ORDER — SENNOSIDES-DOCUSATE SODIUM 8.6-50 MG PO TABS
1.0000 | ORAL_TABLET | Freq: Every evening | ORAL | Status: DC | PRN
  Administered 2014-06-14: 1 via ORAL
  Filled 2014-06-11: qty 1

## 2014-06-11 MED ORDER — PHENOL 1.4 % MT LIQD: 1.0000 | OROMUCOSAL | Status: DC | PRN

## 2014-06-11 MED ORDER — METOCLOPRAMIDE HCL 5 MG/ML IJ SOLN: 5.0000 mg | Freq: Three times a day (TID) | INTRAMUSCULAR | Status: DC | PRN

## 2014-06-11 MED ORDER — METHOCARBAMOL 1000 MG/10ML IJ SOLN
500.0000 mg | Freq: Four times a day (QID) | INTRAVENOUS | Status: DC | PRN
  Administered 2014-06-11: 500 mg via INTRAVENOUS
  Filled 2014-06-11 (×3): qty 5

## 2014-06-11 MED ORDER — HYDROMORPHONE HCL 1 MG/ML IJ SOLN: 1.0000 mg | INTRAMUSCULAR | Status: DC | PRN

## 2014-06-11 MED ORDER — CLOPIDOGREL BISULFATE 75 MG PO TABS
75.0000 mg | ORAL_TABLET | Freq: Every day | ORAL | Status: DC
  Administered 2014-06-12 – 2014-06-15 (×4): 75 mg via ORAL
  Filled 2014-06-11 (×4): qty 1

## 2014-06-11 MED ORDER — PROMETHAZINE HCL 25 MG/ML IJ SOLN
6.2500 mg | INTRAMUSCULAR | Status: DC | PRN
  Administered 2014-06-11: 6.25 mg via INTRAVENOUS

## 2014-06-11 MED ORDER — ENSURE COMPLETE PO LIQD: 237.0000 mL | Freq: Two times a day (BID) | ORAL | Status: DC | PRN

## 2014-06-11 MED ORDER — CARVEDILOL 3.125 MG PO TABS
3.1250 mg | ORAL_TABLET | Freq: Two times a day (BID) | ORAL | Status: DC
  Administered 2014-06-12 – 2014-06-14 (×6): 3.125 mg via ORAL
  Filled 2014-06-11 (×8): qty 1

## 2014-06-11 MED ORDER — PHENYLEPHRINE HCL 10 MG/ML IJ SOLN
INTRAMUSCULAR | Status: DC | PRN
  Administered 2014-06-11 (×6): 80 ug via INTRAVENOUS

## 2014-06-11 MED ORDER — INSULIN NPH (HUMAN) (ISOPHANE) 100 UNIT/ML ~~LOC~~ SUSP
14.0000 [IU] | Freq: Two times a day (BID) | SUBCUTANEOUS | Status: DC
  Administered 2014-06-11 – 2014-06-15 (×8): 14 [IU] via SUBCUTANEOUS
  Filled 2014-06-11 (×2): qty 10

## 2014-06-11 MED ORDER — BISACODYL 5 MG PO TBEC
5.0000 mg | DELAYED_RELEASE_TABLET | Freq: Every day | ORAL | Status: DC | PRN
  Administered 2014-06-14 – 2014-06-15 (×2): 5 mg via ORAL
  Filled 2014-06-11 (×2): qty 1

## 2014-06-11 MED ORDER — ACETAMINOPHEN 10 MG/ML IV SOLN
INTRAVENOUS | Status: AC
  Filled 2014-06-11: qty 100

## 2014-06-11 MED ORDER — TOBRAMYCIN SULFATE 1.2 G IJ SOLR
INTRAMUSCULAR | Status: DC | PRN
  Administered 2014-06-11: 1.2 g

## 2014-06-11 MED ORDER — TIZANIDINE HCL 2 MG PO CAPS: 2.0000 mg | ORAL_CAPSULE | Freq: Three times a day (TID) | ORAL | Status: DC

## 2014-06-11 MED ORDER — FENTANYL CITRATE (PF) 250 MCG/5ML IJ SOLN
INTRAMUSCULAR | Status: AC
  Filled 2014-06-11: qty 5

## 2014-06-11 MED ORDER — ALUM & MAG HYDROXIDE-SIMETH 200-200-20 MG/5ML PO SUSP: 30.0000 mL | ORAL | Status: DC | PRN

## 2014-06-11 MED ORDER — WARFARIN SODIUM 5 MG PO TABS: 5.0000 mg | ORAL_TABLET | Freq: Every day | ORAL | Status: DC

## 2014-06-11 MED ORDER — DEXTROSE 50 % IV SOLN
12.5000 g | Freq: Once | INTRAVENOUS | Status: AC
  Administered 2014-06-11: 12.5 g via INTRAVENOUS

## 2014-06-11 MED ORDER — OXYCODONE HCL 5 MG PO TABS
5.0000 mg | ORAL_TABLET | ORAL | Status: DC | PRN
  Administered 2014-06-11 – 2014-06-13 (×6): 10 mg via ORAL
  Administered 2014-06-14: 5 mg via ORAL
  Administered 2014-06-15 (×2): 10 mg via ORAL
  Filled 2014-06-11: qty 1
  Filled 2014-06-11 (×7): qty 2

## 2014-06-11 MED ORDER — ENSURE ENLIVE PO LIQD: 237.0000 mL | Freq: Two times a day (BID) | ORAL | Status: DC | PRN

## 2014-06-11 MED ORDER — OXYCODONE-ACETAMINOPHEN 5-325 MG PO TABS: 1.0000 | ORAL_TABLET | ORAL | Status: DC | PRN

## 2014-06-11 MED ORDER — SODIUM CHLORIDE 0.9 % IV SOLN
1.0000 g | INTRAVENOUS | Status: DC
  Administered 2014-06-11 – 2014-06-14 (×4): 1 g via INTRAVENOUS
  Filled 2014-06-11 (×6): qty 1

## 2014-06-11 MED ORDER — FENTANYL CITRATE (PF) 100 MCG/2ML IJ SOLN
INTRAMUSCULAR | Status: DC | PRN
  Administered 2014-06-11 (×5): 50 ug via INTRAVENOUS

## 2014-06-11 MED ORDER — ONDANSETRON HCL 4 MG PO TABS: 4.0000 mg | ORAL_TABLET | Freq: Four times a day (QID) | ORAL | Status: DC | PRN

## 2014-06-11 MED ORDER — DEXTROSE 50 % IV SOLN
INTRAVENOUS | Status: AC
  Filled 2014-06-11: qty 50

## 2014-06-11 MED ORDER — METOCLOPRAMIDE HCL 5 MG PO TABS: 5.0000 mg | ORAL_TABLET | Freq: Three times a day (TID) | ORAL | Status: DC | PRN

## 2014-06-11 MED ORDER — DEXTROSE 5 % IV SOLN
10.0000 mg | INTRAVENOUS | Status: DC | PRN
  Administered 2014-06-11: 80 ug via INTRAVENOUS

## 2014-06-11 MED ORDER — TAMSULOSIN HCL 0.4 MG PO CAPS
0.4000 mg | ORAL_CAPSULE | Freq: Every day | ORAL | Status: DC
  Administered 2014-06-12 – 2014-06-15 (×4): 0.4 mg via ORAL
  Filled 2014-06-11 (×4): qty 1

## 2014-06-11 MED ORDER — HYDROMORPHONE HCL 1 MG/ML IJ SOLN
INTRAMUSCULAR | Status: AC
  Administered 2014-06-11: 0.25 mg via INTRAVENOUS
  Filled 2014-06-11: qty 1

## 2014-06-11 MED ORDER — CEFUROXIME SODIUM 1.5 G IJ SOLR
INTRAMUSCULAR | Status: AC
  Filled 2014-06-11: qty 1.5

## 2014-06-11 MED ORDER — ONDANSETRON HCL 4 MG/2ML IJ SOLN
INTRAMUSCULAR | Status: DC | PRN
  Administered 2014-06-11: 4 mg via INTRAVENOUS

## 2014-06-11 MED ORDER — FAMOTIDINE 20 MG PO TABS
20.0000 mg | ORAL_TABLET | Freq: Every day | ORAL | Status: DC
  Administered 2014-06-12 – 2014-06-15 (×4): 20 mg via ORAL
  Filled 2014-06-11 (×5): qty 1

## 2014-06-11 MED ORDER — FLEET ENEMA 7-19 GM/118ML RE ENEM: 1.0000 | ENEMA | Freq: Once | RECTAL | Status: AC | PRN

## 2014-06-11 MED ORDER — SODIUM CHLORIDE 0.9 % IJ SOLN
INTRAMUSCULAR | Status: DC | PRN
  Administered 2014-06-11: 40 mL via INTRAVENOUS

## 2014-06-11 MED ORDER — PROMETHAZINE HCL 25 MG/ML IJ SOLN
INTRAMUSCULAR | Status: AC
  Administered 2014-06-11: 6.25 mg via INTRAVENOUS
  Filled 2014-06-11: qty 1

## 2014-06-11 MED ORDER — BUPIVACAINE LIPOSOME 1.3 % IJ SUSP
20.0000 mL | INTRAMUSCULAR | Status: DC
  Filled 2014-06-11: qty 20

## 2014-06-11 MED ORDER — PHENYLEPHRINE 40 MCG/ML (10ML) SYRINGE FOR IV PUSH (FOR BLOOD PRESSURE SUPPORT)
PREFILLED_SYRINGE | INTRAVENOUS | Status: AC
  Filled 2014-06-11: qty 20

## 2014-06-11 MED ORDER — METHOCARBAMOL 500 MG PO TABS
500.0000 mg | ORAL_TABLET | Freq: Four times a day (QID) | ORAL | Status: DC | PRN
  Administered 2014-06-12: 500 mg via ORAL
  Filled 2014-06-11 (×3): qty 1

## 2014-06-11 MED ORDER — OXYCODONE HCL 5 MG PO TABS
ORAL_TABLET | ORAL | Status: AC
  Administered 2014-06-11: 10 mg via ORAL
  Filled 2014-06-11: qty 2

## 2014-06-11 MED ORDER — DEXTROSE 50 % IV SOLN
INTRAVENOUS | Status: AC
  Administered 2014-06-11: 25 mL via INTRAVENOUS
  Filled 2014-06-11: qty 50

## 2014-06-11 MED ORDER — TOBRAMYCIN SULFATE 1.2 G IJ SOLR
INTRAMUSCULAR | Status: AC
  Filled 2014-06-11: qty 1.2

## 2014-06-11 MED ORDER — ONDANSETRON HCL 4 MG/2ML IJ SOLN
INTRAMUSCULAR | Status: AC
  Filled 2014-06-11: qty 2

## 2014-06-11 MED ORDER — MENTHOL 3 MG MT LOZG: 1.0000 | LOZENGE | OROMUCOSAL | Status: DC | PRN

## 2014-06-11 MED ORDER — DEXTROSE 50 % IV SOLN
1.0000 | Freq: Once | INTRAVENOUS | Status: AC
  Administered 2014-06-11: 50 mL via INTRAVENOUS

## 2014-06-11 SURGICAL SUPPLY — 64 items
BANDAGE ELASTIC 4 VELCRO ST LF (GAUZE/BANDAGES/DRESSINGS) ×3 IMPLANT
BANDAGE ELASTIC 6 VELCRO ST LF (GAUZE/BANDAGES/DRESSINGS) ×3 IMPLANT
BANDAGE ESMARK 6X9 LF (GAUZE/BANDAGES/DRESSINGS) ×1 IMPLANT
BLADE SAG 18X100X1.27 (BLADE) ×3 IMPLANT
BLADE SAW SAG 90X13X1.27 (BLADE) ×3 IMPLANT
BLADE SURG ROTATE 9660 (MISCELLANEOUS) IMPLANT
BNDG ESMARK 6X9 LF (GAUZE/BANDAGES/DRESSINGS) ×3
BOWL SMART MIX CTS (DISPOSABLE) ×3 IMPLANT
CAP KNEE TOTAL 3 SIGMA ×3 IMPLANT
COVER BACK TABLE 24X17X13 BIG (DRAPES) IMPLANT
COVER SURGICAL LIGHT HANDLE (MISCELLANEOUS) ×3 IMPLANT
CUFF TOURNIQUET SINGLE 34IN LL (TOURNIQUET CUFF) ×3 IMPLANT
CUFF TOURNIQUET SINGLE 44IN (TOURNIQUET CUFF) IMPLANT
DISC DIAMOND MED (BURR) IMPLANT
DRAPE EXTREMITY T 121X128X90 (DRAPE) ×3 IMPLANT
DRAPE IMP U-DRAPE 54X76 (DRAPES) ×3 IMPLANT
DRAPE U-SHAPE 47X51 STRL (DRAPES) ×3 IMPLANT
DURAPREP 26ML APPLICATOR (WOUND CARE) ×3 IMPLANT
ELECT REM PT RETURN 9FT ADLT (ELECTROSURGICAL) ×3
ELECTRODE REM PT RTRN 9FT ADLT (ELECTROSURGICAL) ×1 IMPLANT
EVACUATOR 1/8 PVC DRAIN (DRAIN) IMPLANT
GAUZE SPONGE 4X4 12PLY STRL (GAUZE/BANDAGES/DRESSINGS) ×3 IMPLANT
GAUZE XEROFORM 1X8 LF (GAUZE/BANDAGES/DRESSINGS) ×3 IMPLANT
GLOVE BIO SURGEON STRL SZ7.5 (GLOVE) ×3 IMPLANT
GLOVE BIO SURGEON STRL SZ8.5 (GLOVE) ×6 IMPLANT
GLOVE BIOGEL PI IND STRL 8 (GLOVE) ×2 IMPLANT
GLOVE BIOGEL PI IND STRL 9 (GLOVE) ×1 IMPLANT
GLOVE BIOGEL PI INDICATOR 8 (GLOVE) ×4
GLOVE BIOGEL PI INDICATOR 9 (GLOVE) ×2
GOWN STRL REUS W/ TWL LRG LVL3 (GOWN DISPOSABLE) ×1 IMPLANT
GOWN STRL REUS W/ TWL XL LVL3 (GOWN DISPOSABLE) ×3 IMPLANT
GOWN STRL REUS W/TWL LRG LVL3 (GOWN DISPOSABLE) ×2
GOWN STRL REUS W/TWL XL LVL3 (GOWN DISPOSABLE) ×6
HANDPIECE INTERPULSE COAX TIP (DISPOSABLE) ×2
HOOD PEEL AWAY FACE SHEILD DIS (HOOD) ×9 IMPLANT
IMMOBILIZER KNEE 22  40 CIR (ORTHOPEDIC SUPPLIES) ×2
IMMOBILIZER KNEE 22 40 CIR (ORTHOPEDIC SUPPLIES) ×1 IMPLANT
KIT BASIN OR (CUSTOM PROCEDURE TRAY) ×3 IMPLANT
KIT ROOM TURNOVER OR (KITS) ×3 IMPLANT
MANIFOLD NEPTUNE II (INSTRUMENTS) ×3 IMPLANT
NEEDLE SPNL 18GX3.5 QUINCKE PK (NEEDLE) ×3 IMPLANT
NS IRRIG 1000ML POUR BTL (IV SOLUTION) ×3 IMPLANT
PACK TOTAL JOINT (CUSTOM PROCEDURE TRAY) ×3 IMPLANT
PACK UNIVERSAL I (CUSTOM PROCEDURE TRAY) ×3 IMPLANT
PAD ARMBOARD 7.5X6 YLW CONV (MISCELLANEOUS) ×6 IMPLANT
PAD CAST 4YDX4 CTTN HI CHSV (CAST SUPPLIES) ×1 IMPLANT
PADDING CAST COTTON 4X4 STRL (CAST SUPPLIES) ×2
PADDING CAST COTTON 6X4 STRL (CAST SUPPLIES) ×3 IMPLANT
RASP HELIOCORDIAL MED (MISCELLANEOUS) IMPLANT
SET HNDPC FAN SPRY TIP SCT (DISPOSABLE) ×1 IMPLANT
STAPLER VISISTAT 35W (STAPLE) ×3 IMPLANT
SUCTION FRAZIER TIP 10 FR DISP (SUCTIONS) ×3 IMPLANT
SUT VIC AB 0 CT1 27 (SUTURE) ×2
SUT VIC AB 0 CT1 27XBRD ANBCTR (SUTURE) ×1 IMPLANT
SUT VIC AB 1 CTX 36 (SUTURE) ×2
SUT VIC AB 1 CTX36XBRD ANBCTR (SUTURE) ×1 IMPLANT
SUT VIC AB 2-0 CT1 27 (SUTURE) ×2
SUT VIC AB 2-0 CT1 TAPERPNT 27 (SUTURE) ×1 IMPLANT
SYR 50ML LL SCALE MARK (SYRINGE) ×3 IMPLANT
TOWEL OR 17X24 6PK STRL BLUE (TOWEL DISPOSABLE) ×3 IMPLANT
TOWEL OR 17X26 10 PK STRL BLUE (TOWEL DISPOSABLE) ×3 IMPLANT
TRAY FOLEY CATH 14FR (SET/KITS/TRAYS/PACK) IMPLANT
TUBE ANAEROBIC SPECIMEN COL (MISCELLANEOUS) ×3 IMPLANT
WATER STERILE IRR 1000ML POUR (IV SOLUTION) IMPLANT

## 2014-06-11 NOTE — Progress Notes (Signed)
Notified Dr.Rose that pt Blood Sugar was 68,orders received to give 1/2 an amp. Also notified that Coumadin was taken on Fri, April 22 so he will not be doing a block.

## 2014-06-11 NOTE — Progress Notes (Signed)
Notified Dr.Rose of CBG of 76-order received to give another 1/2 amp of D50

## 2014-06-11 NOTE — Discharge Instructions (Addendum)
INSTRUCTIONS AFTER JOINT REPLACEMENT  ° °o Remove items at home which could result in a fall. This includes throw rugs or furniture in walking pathways °o ICE to the affected joint every three hours while awake for 30 minutes at a time, for at least the first 3-5 days, and then as needed for pain and swelling.  Continue to use ice for pain and swelling. You may notice swelling that will progress down to the foot and ankle.  This is normal after surgery.  Elevate your leg when you are not up walking on it.   °o Continue to use the breathing machine you got in the hospital (incentive spirometer) which will help keep your temperature down.  It is common for your temperature to cycle up and down following surgery, especially at night when you are not up moving around and exerting yourself.  The breathing machine keeps your lungs expanded and your temperature down. ° ° °DIET:  As you were doing prior to hospitalization, we recommend a well-balanced diet. ° °DRESSING / WOUND CARE / SHOWERING ° °You may change your dressing 3-5 days after surgery.  Then change the dressing every day with sterile gauze.  Please use good hand washing techniques before changing the dressing.  Do not use any lotions or creams on the incision until instructed by your surgeon. ° °ACTIVITY ° °o Increase activity slowly as tolerated, but follow the weight bearing instructions below.   °o No driving for 6 weeks or until further direction given by your physician.  You cannot drive while taking narcotics.  °o No lifting or carrying greater than 10 lbs. until further directed by your surgeon. °o Avoid periods of inactivity such as sitting longer than an hour when not asleep. This helps prevent blood clots.  °o You may return to work once you are authorized by your doctor.  ° ° ° °WEIGHT BEARING  ° °Weight bearing as tolerated with assist device (walker, cane, etc) as directed, use it as long as suggested by your surgeon or therapist, typically at  least 4-6 weeks. ° ° °EXERCISES ° °Results after joint replacement surgery are often greatly improved when you follow the exercise, range of motion and muscle strengthening exercises prescribed by your doctor. Safety measures are also important to protect the joint from further injury. Any time any of these exercises cause you to have increased pain or swelling, decrease what you are doing until you are comfortable again and then slowly increase them. If you have problems or questions, call your caregiver or physical therapist for advice.  ° °Rehabilitation is important following a joint replacement. After just a few days of immobilization, the muscles of the leg can become weakened and shrink (atrophy).  These exercises are designed to build up the tone and strength of the thigh and leg muscles and to improve motion. Often times heat used for twenty to thirty minutes before working out will loosen up your tissues and help with improving the range of motion but do not use heat for the first two weeks following surgery (sometimes heat can increase post-operative swelling).  ° °These exercises can be done on a training (exercise) mat, on the floor, on a table or on a bed. Use whatever works the best and is most comfortable for you.    Use music or television while you are exercising so that the exercises are a pleasant break in your day. This will make your life better with the exercises acting as a break   in your routine that you can look forward to.   Perform all exercises about fifteen times, three times per day or as directed.  You should exercise both the operative leg and the other leg as well. ° °Exercises include: °  °• Quad Sets - Tighten up the muscle on the front of the thigh (Quad) and hold for 5-10 seconds.   °• Straight Leg Raises - With your knee straight (if you were given a brace, keep it on), lift the leg to 60 degrees, hold for 3 seconds, and slowly lower the leg.  Perform this exercise against  resistance later as your leg gets stronger.  °• Leg Slides: Lying on your back, slowly slide your foot toward your buttocks, bending your knee up off the floor (only go as far as is comfortable). Then slowly slide your foot back down until your leg is flat on the floor again.  °• Angel Wings: Lying on your back spread your legs to the side as far apart as you can without causing discomfort.  °• Hamstring Strength:  Lying on your back, push your heel against the floor with your leg straight by tightening up the muscles of your buttocks.  Repeat, but this time bend your knee to a comfortable angle, and push your heel against the floor.  You may put a pillow under the heel to make it more comfortable if necessary.  ° °A rehabilitation program following joint replacement surgery can speed recovery and prevent re-injury in the future due to weakened muscles. Contact your doctor or a physical therapist for more information on knee rehabilitation.  ° ° °CONSTIPATION ° °Constipation is defined medically as fewer than three stools per week and severe constipation as less than one stool per week.  Even if you have a regular bowel pattern at home, your normal regimen is likely to be disrupted due to multiple reasons following surgery.  Combination of anesthesia, postoperative narcotics, change in appetite and fluid intake all can affect your bowels.  ° °YOU MUST use at least one of the following options; they are listed in order of increasing strength to get the job done.  They are all available over the counter, and you may need to use some, POSSIBLY even all of these options:   ° °Drink plenty of fluids (prune juice may be helpful) and high fiber foods °Colace 100 mg by mouth twice a day  °Senokot for constipation as directed and as needed Dulcolax (bisacodyl), take with full glass of water  °Miralax (polyethylene glycol) once or twice a day as needed. ° °If you have tried all these things and are unable to have a bowel  movement in the first 3-4 days after surgery call either your surgeon or your primary doctor.   ° °If you experience loose stools or diarrhea, hold the medications until you stool forms back up.  If your symptoms do not get better within 1 week or if they get worse, check with your doctor.  If you experience "the worst abdominal pain ever" or develop nausea or vomiting, please contact the office immediately for further recommendations for treatment. ° ° °ITCHING:  If you experience itching with your medications, try taking only a single pain pill, or even half a pain pill at a time.  You can also use Benadryl over the counter for itching or also to help with sleep.  ° °TED HOSE STOCKINGS:  Use stockings on both legs until for at least 2 weeks or as   directed by physician office. They may be removed at night for sleeping.  MEDICATIONS:  See your medication summary on the After Visit Summary that nursing will review with you.  You may have some home medications which will be placed on hold until you complete the course of blood thinner medication.  It is important for you to complete the blood thinner medication as prescribed.  PRECAUTIONS:  If you experience chest pain or shortness of breath - call 911 immediately for transfer to the hospital emergency department.   If you develop a fever greater that 101 F, purulent drainage from wound, increased redness or drainage from wound, foul odor from the wound/dressing, or calf pain - CONTACT YOUR SURGEON.                                                   FOLLOW-UP APPOINTMENTS:  If you do not already have a post-op appointment, please call the office for an appointment to be seen by your surgeon.  Guidelines for how soon to be seen are listed in your After Visit Summary, but are typically between 1-4 weeks after surgery.  OTHER INSTRUCTIONS:   Knee Replacement:  Do not place pillow under knee, focus on keeping the knee straight while resting. CPM  instructions: 0-90 degrees, 2 hours in the morning, 2 hours in the afternoon, and 2 hours in the evening. Place foam block, curve side up under heel at all times except when in CPM or when walking.  DO NOT modify, tear, cut, or change the foam block in any way.  MAKE SURE YOU:   Understand these instructions.   Get help right away if you are not doing well or get worse.    Thank you for letting us be a part of your medical care team.  It is a privilege we respect greatly.  We hope these instructions will help you stay on track for a fast and full recovery!    Information on my medicine - Coumadin   (Warfarin)  This medication education was reviewed with me or my healthcare representative as part of my discharge preparation.  The pharmacist that spoke with me during my hospital stay was:  Lavenia Atlas, [facility]  Why was Coumadin prescribed for you? Coumadin was prescribed for you because you have a blood clot or a medical condition that can cause an increased risk of forming blood clots. Blood clots can cause serious health problems by blocking the flow of blood to the heart, lung, or brain. Coumadin can prevent harmful blood clots from forming. As a reminder your indication for Coumadin is:   Deep Vein Thrombosis Treatment  And for blood clot prevention after orthopedic surgery   What test will check on my response to Coumadin? While on Coumadin (warfarin) you will need to have an INR test regularly to ensure that your dose is keeping you in the desired range. The INR (international normalized ratio) number is calculated from the result of the laboratory test called prothrombin time (PT).  If an INR APPOINTMENT HAS NOT ALREADY BEEN MADE FOR YOU please schedule an appointment to have this lab work done by your health care provider within 7 days. Your INR goal is usually a number between:  2 to 3 or your provider may give you a more narrow range like 2-2.5.  Ask  your health care provider  during an office visit what your goal INR is.  What  do you need to  know  About  COUMADIN? Take Coumadin (warfarin) exactly as prescribed by your healthcare provider about the same time each day.  DO NOT stop taking without talking to the doctor who prescribed the medication.  Stopping without other blood clot prevention medication to take the place of Coumadin may increase your risk of developing a new clot or stroke.  Get refills before you run out.  What do you do if you miss a dose? If you miss a dose, take it as soon as you remember on the same day then continue your regularly scheduled regimen the next day.  Do not take two doses of Coumadin at the same time.  Important Safety Information A possible side effect of Coumadin (Warfarin) is an increased risk of bleeding. You should call your healthcare provider right away if you experience any of the following: ? Bleeding from an injury or your nose that does not stop. ? Unusual colored urine (red or dark brown) or unusual colored stools (red or black). ? Unusual bruising for unknown reasons. ? A serious fall or if you hit your head (even if there is no bleeding).  Some foods or medicines interact with Coumadin (warfarin) and might alter your response to warfarin. To help avoid this: ? Eat a balanced diet, maintaining a consistent amount of Vitamin K. ? Notify your provider about major diet changes you plan to make. ? Avoid alcohol or limit your intake to 1 drink for women and 2 drinks for men per day. (1 drink is 5 oz. wine, 12 oz. beer, or 1.5 oz. liquor.)  Make sure that ANY health care provider who prescribes medication for you knows that you are taking Coumadin (warfarin).  Also make sure the healthcare provider who is monitoring your Coumadin knows when you have started a new medication including herbals and non-prescription products.  Coumadin (Warfarin)  Major Drug Interactions  Increased Warfarin Effect Decreased Warfarin Effect   Alcohol (large quantities) Antibiotics (esp. Septra/Bactrim, Flagyl, Cipro) Amiodarone (Cordarone) Aspirin (ASA) Cimetidine (Tagamet) Megestrol (Megace) NSAIDs (ibuprofen, naproxen, etc.) Piroxicam (Feldene) Propafenone (Rythmol SR) Propranolol (Inderal) Isoniazid (INH) Posaconazole (Noxafil) Barbiturates (Phenobarbital) Carbamazepine (Tegretol) Chlordiazepoxide (Librium) Cholestyramine (Questran) Griseofulvin Oral Contraceptives Rifampin Sucralfate (Carafate) Vitamin K   Coumadin (Warfarin) Major Herbal Interactions  Increased Warfarin Effect Decreased Warfarin Effect  Garlic Ginseng Ginkgo biloba Coenzyme Q10 Green tea St. Johns wort    Coumadin (Warfarin) FOOD Interactions  Eat a consistent number of servings per week of foods HIGH in Vitamin K (1 serving =  cup)  Collards (cooked, or boiled & drained) Kale (cooked, or boiled & drained) Mustard greens (cooked, or boiled & drained) Parsley *serving size only =  cup Spinach (cooked, or boiled & drained) Swiss chard (cooked, or boiled & drained) Turnip greens (cooked, or boiled & drained)  Eat a consistent number of servings per week of foods MEDIUM-HIGH in Vitamin K (1 serving = 1 cup)  Asparagus (cooked, or boiled & drained) Broccoli (cooked, boiled & drained, or raw & chopped) Brussel sprouts (cooked, or boiled & drained) *serving size only =  cup Lettuce, raw (green leaf, endive, romaine) Spinach, raw Turnip greens, raw & chopped   These websites have more information on Coumadin (warfarin):  FailFactory.se; VeganReport.com.au;

## 2014-06-11 NOTE — Anesthesia Preprocedure Evaluation (Addendum)
Anesthesia Evaluation  Patient identified by MRN, date of birth, ID band Patient awake    Reviewed: Allergy & Precautions, NPO status , Patient's Chart, lab work & pertinent test results  Airway Mallampati: II  TM Distance: >3 FB Neck ROM: Full    Dental no notable dental hx. (+) Teeth Intact, Dental Advidsory Given   Pulmonary neg pulmonary ROS, former smoker,  breath sounds clear to auscultation  Pulmonary exam normal       Cardiovascular hypertension, + CAD and + Past MI + dysrhythmias + pacemaker Rhythm:Regular Rate:Normal     Neuro/Psych negative neurological ROS  negative psych ROS   GI/Hepatic negative GI ROS, Neg liver ROS,   Endo/Other  diabetes  Renal/GU Renal InsufficiencyRenal disease  negative genitourinary   Musculoskeletal negative musculoskeletal ROS (+)   Abdominal   Peds negative pediatric ROS (+)  Hematology negative hematology ROS (+)   Anesthesia Other Findings   Reproductive/Obstetrics negative OB ROS                            Anesthesia Physical Anesthesia Plan  ASA: III  Anesthesia Plan: General   Post-op Pain Management:    Induction: Intravenous  Airway Management Planned: LMA  Additional Equipment:   Intra-op Plan:   Post-operative Plan: Extubation in OR  Informed Consent: I have reviewed the patients History and Physical, chart, labs and discussed the procedure including the risks, benefits and alternatives for the proposed anesthesia with the patient or authorized representative who has indicated his/her understanding and acceptance.   Dental advisory given and Dental Advisory Given  Plan Discussed with: CRNA, Surgeon and Anesthesiologist  Anesthesia Plan Comments:        Anesthesia Quick Evaluation

## 2014-06-11 NOTE — Anesthesia Postprocedure Evaluation (Signed)
  Anesthesia Post-op Note  Patient: Randy [name]  Procedure(s) Performed: Procedure(s): TOTAL KNEE ARTHROPLASTY WITH REVISION COMPONENTS REMOVE SPACER PLACE TKA (Right)  Patient Location: PACU  Anesthesia Type:General  Level of Consciousness: awake  Airway and Oxygen Therapy: Patient Spontanous Breathing  Post-op Pain: mild  Post-op Assessment: Post-op Vital signs reviewed  Post-op Vital Signs: Reviewed  Last Vitals:  Filed Vitals:   06/11/14 1700  BP: 124/63  Pulse: 70  Temp: 36.6 C  Resp: 24    Complications: No apparent anesthesia complications

## 2014-06-11 NOTE — Interval H&P Note (Signed)
History and Physical Interval Note:  06/11/2014 1:39 PM  Randy [name]  has presented today for surgery, with the diagnosis of RETAINED SPACER S/P REMOVAL RIGHT TOTAL KNEE  The various methods of treatment have been discussed with the patient and family. After consideration of risks, benefits and other options for treatment, the patient has consented to  Procedure(s): TOTAL KNEE ARTHROPLASTY WITH REVISION COMPONENTS REMOVE SPACER PLACE TKA (Right) as a surgical intervention .  The patient's history has been reviewed, patient examined, no change in status, stable for surgery.  I have reviewed the patient's chart and labs.  Questions were answered to the patient's satisfaction.     Kerin Salen

## 2014-06-11 NOTE — Anesthesia Procedure Notes (Signed)
Procedure Name: Intubation Date/Time: 06/11/2014 2:30 PM Performed by: Manus Gunning, Steffen Hase J Pre-anesthesia Checklist: Patient identified, Timeout performed, Emergency Drugs available, Suction available and Patient being monitored Patient Re-evaluated:Patient Re-evaluated prior to inductionOxygen Delivery Method: Circle system utilized Preoxygenation: Pre-oxygenation with 100% oxygen Intubation Type: IV induction Ventilation: Mask ventilation without difficulty Laryngoscope Size: Mac and 4 Grade View: Grade I Tube type: Oral Tube size: 7.0 mm Number of attempts: 1 Placement Confirmation: ETT inserted through vocal cords under direct vision,  positive ETCO2 and breath sounds checked- equal and bilateral Secured at: 21 cm Tube secured with: Tape Dental Injury: Teeth and Oropharynx as per pre-operative assessment

## 2014-06-11 NOTE — Progress Notes (Signed)
ANTICOAGULATION CONSULT NOTE - Initial Consult  Pharmacy Consult for Coumadin Indication: DVT history  No Known Allergies  Patient Measurements: Height: 5\' 7"  (170.2 cm) Weight: 163 lb (73.936 kg) IBW/kg (Calculated) : 66.1  Vital Signs: Temp: 97 F (36.1 C) (04/25 1820) Temp Source: Oral (04/25 0940) BP: 111/53 mmHg (04/25 1848) Pulse Rate: 70 (04/25 1848)  Labs:  Recent Labs  06/11/14 1013  LABPROT 19.8*  INR 1.66*    Estimated Creatinine Clearance: 30.2 mL/min (by C-G formula based on Cr of 1.58).   Medical History: Past Medical History  Diagnosis Date  . Diabetes mellitus without complication   . Anxiety   . Hypertension   . Hyperlipidemia   . Presence of permanent cardiac pacemaker   . MI (myocardial infarction) 2014  . Arthritis     R knee,   . Dysrhythmia     PAF;,sss s/p St. Jude PPM  . CKD (chronic kidney disease)   . Coronary artery disease     Assessment: 81 year old male on Coumadin PTA for DVT history, now s/p revision of right knee arthroplasty.  Last dose of Coumadin 4/22 with INR today of 1.66  Also on Vancomycin prior to admission -- resumed at dose of 1250 mg iv Q 24 hours  Goal of Therapy:  INR 2-3 Monitor platelets by anticoagulation protocol: Yes   Plan:  Coumadin 5 mg po daily at 1800 pm Daily INR  Thank you Anette Guarneri, PharmD  Tad Moore 06/11/2014,8:17 PM

## 2014-06-11 NOTE — Transfer of Care (Signed)
Immediate Anesthesia Transfer of Care Note  Patient: Randy [name]  Procedure(s) Performed: Procedure(s): TOTAL KNEE ARTHROPLASTY WITH REVISION COMPONENTS REMOVE SPACER PLACE TKA (Right)  Patient Location: PACU  Anesthesia Type:General  Level of Consciousness: awake  Airway & Oxygen Therapy: Patient Spontanous Breathing  Post-op Assessment: Report given to RN and Post -op Vital signs reviewed and stable  Post vital signs: Reviewed and stable  Last Vitals:  Filed Vitals:   06/11/14 0940  BP: 143/61  Pulse: 70  Temp: 36.3 C  Resp: 18    Complications: No apparent anesthesia complications

## 2014-06-11 NOTE — Op Note (Signed)
PATIENT ID:      Randy [name]  MRN:     [id number] DOB/AGE:    January 05, 1925 / 81 y.o.       OPERATIVE REPORT    DATE OF PROCEDURE:  06/11/2014       PREOPERATIVE DIAGNOSIS:   RETAINED SPACER S/P REMOVAL RIGHT TOTAL KNEE      Estimated body mass index is 25.52 kg/(m^2) as calculated from the following:   Height as of this encounter: 5\' 7"  (1.702 m).   Weight as of this encounter: 73.936 kg (163 lb).                                                        POSTOPERATIVE DIAGNOSIS:   Retained Specer S/p Removal Right Total knee                                                                      PROCEDURE:  Procedure(s): TOTAL KNEE ARTHROPLASTY WITH REVISION COMPONENTS REMOVE SPACER PLACE TKA Using Depuy Revision implants #5R Femur, #5Tibia, 47mm sigma RP bearing, 41 Patella     SURGEON: Analis Distler J    ASSISTANT:   [employer]  (present throughout entire procedure and necessary for timely completion of the procedure)   ANESTHESIA: GET with Femoral Nerve Block  DRAINS: foley, 2 medium hemovac in knee   TOURNIQUET TIME: 40JWJ   COMPLICATIONS:  None     SPECIMENS: None   INDICATIONS FOR PROCEDURE: The patient is status post removal of infected total knee implants with placement of an antibiotic impregnated polymethylmethacrylate spacer a little over an weeks ago. He had urosepsis in November 2015 with Escherichia coli and his blood. His total knee swelled up a month later. Aspiration revealed 123,000 white cells but nothing ever grew on cultures. On follow-up in the clinic last week he had full and out of bed a couple of times and partially dislocated the cement spacer anteriorly in relation to the tibia. He has had 6 weeks of IV antibiotics,  CBC, sedimentation rate 22 and CRP are all normal. Patient desires desires reimplantation of total knee arthroplasty probably with either constrained components or a hinged based on the condition of the ligaments at surgery. He understands there is  at least a 15% chance of recurrence of the infection and accepts the risks. All questions have been encouraged and answered.   DESCRIPTION OF PROCEDURE: The patient identified by armband, received  right femoral nerve block and IV antibiotics, in the holding area at [facility]. Patient taken to the operating room, appropriate anesthetic  monitors were attached General endotracheal anesthesia induced with  the patient in supine position, Foley catheter was inserted. Tourniquet  applied high to the operative thigh. Lateral post and foot positioner  applied to the table, the lower extremity was then prepped and draped  in usual sterile fashion from the ankle to the tourniquet. Time-out procedure was performed. The tourniquet was inflated to 300 mmHg during approach until we were able to Hyperflex the knee. We began the operation by making the anterior  midline incision starting at handbreadth above the patella going over the patella 1 cm medial to and  6 cm distal to the tibial tubercle. Small bleeders in the skin and the  subcutaneous tissue identified and cauterized. Transverse retinaculum was incised and reflected medially and a medial parapatellar arthrotomy was accomplished. the patella was everted and prepatellar scar tissue was excised with the electrocautery. The superficial medial collateral  ligament was then elevated from anterior to posterior along the proximal  flare of the tibia. Using an osteotome and curettes the PMMA spacer was removed without difficulty piecemeal. The knee was flexed and further scar tissue was removed to allow motion and anterior translation of the tibia beneath the femur. At this time the tourniquet was let down and bleeders identified and cauterized. We continued to  work our way around posteriorly along the proximal tibia, and externally  rotated the tibia subluxing it out from underneath the femur. A McHale  retractor was placed through the notch and a  lateral Hohmann retractor  placed, and we then drilled through the proximal tibia in line with the  axis of the tibia followed by an intramedullary guide rod and 2-degree  posterior slope cutting guide. The tibial cutting guide was pinned into place  allowing resection of a small amount of bone to freshen up the tibial cut. Tibial bone quality was excellent and primary tibial implant was to be selected. Satisfied with the tibial resection, we then placed an intramedullary rod and side the femur and performed a 1-2 mm freshening cut on the distal femur at 5 right. We then sized for a #5 right femoral component and kept the rotation from the original component. The distal femoral chamfer cutting guide was positioned and pinned into place and the anterior chamfer and posterior cuts were freshened up without difficulty. We then tendon placed the box cutting guide and performed the box cut. We checked our  extension and flexion gaps and found them symmetric at 50mm.  The patella was then everted with towel clips and a brush cut accomplished removing small peripheral osteophytes behind a patellar thickness of about 13 mm. This was sized for 41 button and reach redrilled. The knee was then once again hyperflexed exposing the proximal tibia and at the trial #5 tibial base. We then hammered into place the5R trial femoral component. We then inserted a 10-mm trial bearing, trial patellar button, and took the knee through range of motion from 0-90 degrees. a lateral release was performed to help the patella center better as she took it for range of motion. The limb was wrapped with an Esmarch bandage and the tourniquet inflated to 300 mmHg with a trial components in place. At this point, all trial components were removed, all bony surfaces were waterpicked clean with pulse lavage and dried with suction and sponges. A double batch of DePuy HV cement with 1500 mg of Zinacef, and 1200 mg of tobramycin powder was mixed and  applied to all bony metallic mating surfaces except for the posterior condyles of the femur itself. In order, we hammered into place the tibial tray and removed excess cement, the femoral component  and removed excess cement, a 10 mm  RP bearing  was inserted, and the knee brought to full extension with compression.  The patellar button was clamped into place, and excess cement  removed. While the cement cured the wound was irrigated out with normal saline solution pulse lavage, and medium Hemovac drains were placed from an anterolateral  approach. Ligament stability and patellar tracking were checked and found to be excellent. The parapatellar arthrotomy was closed with  running #1 Vicryl suture. The subcutaneous tissue with 0 and 2-0 undyed  Vicryl suture, and the skin with skin staples. A dressing of Xeroform,  4 x 4, dressing sponges, Webril, and Ace wrap applied. The patient  awakened, extubated, and taken to recovery room without difficulty.  Mayra Reel J 06/11/2014, 4:12 PM

## 2014-06-12 ENCOUNTER — Encounter (HOSPITAL_COMMUNITY): Payer: Self-pay | Admitting: Orthopedic Surgery

## 2014-06-12 LAB — CBC
HCT: 30.2 % — ABNORMAL LOW (ref 39.0–52.0)
Hemoglobin: 10 g/dL — ABNORMAL LOW (ref 13.0–17.0)
MCH: 28.2 pg (ref 26.0–34.0)
MCHC: 33.1 g/dL (ref 30.0–36.0)
MCV: 85.1 fL (ref 78.0–100.0)
PLATELETS: 309 10*3/uL (ref 150–400)
RBC: 3.55 MIL/uL — AB (ref 4.22–5.81)
RDW: 16 % — ABNORMAL HIGH (ref 11.5–15.5)
WBC: 13 10*3/uL — ABNORMAL HIGH (ref 4.0–10.5)

## 2014-06-12 LAB — GLUCOSE, CAPILLARY
Glucose-Capillary: 143 mg/dL — ABNORMAL HIGH (ref 70–99)
Glucose-Capillary: 173 mg/dL — ABNORMAL HIGH (ref 70–99)
Glucose-Capillary: 130 mg/dL — ABNORMAL HIGH (ref 70–99)
Glucose-Capillary: 158 mg/dL — ABNORMAL HIGH (ref 70–99)

## 2014-06-12 LAB — VANCOMYCIN, TROUGH: VANCOMYCIN TR: 15.2 ug/mL (ref 10.0–20.0)

## 2014-06-12 MED ORDER — SODIUM CHLORIDE 0.9 % IJ SOLN: 10.0000 mL | Freq: Two times a day (BID) | INTRAMUSCULAR | Status: DC

## 2014-06-12 MED ORDER — SODIUM CHLORIDE 0.9 % IJ SOLN
10.0000 mL | INTRAMUSCULAR | Status: DC | PRN
  Administered 2014-06-14: 10 mL
  Filled 2014-06-12: qty 40

## 2014-06-12 MED ORDER — WARFARIN SODIUM 5 MG PO TABS
5.0000 mg | ORAL_TABLET | Freq: Once | ORAL | Status: AC
  Administered 2014-06-12: 5 mg via ORAL
  Filled 2014-06-12: qty 1

## 2014-06-12 NOTE — Evaluation (Signed)
Occupational Therapy Evaluation Patient Details Name: Randy [name] MRN: [id number] DOB: 08-May-1924 Today's Date: 06/12/2014    History of Present Illness Patient is a 81 y/o male s/p Right TKA with revision components remove spacer place. 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    Clinical Impression   Patient required an AD for mobility and family was assisting with ADLs PTA. Patient currently functioning at an overall max>total assist level for ADLs and bed mobility. Did not complete transfer secondary to increased pain and patient's inability to sit EOB. Patient will benefit from acute OT to increase overall independence in the areas of ADLs, functional mobility, and overall safety in order to safely discharge to venue listed below.     Follow Up Recommendations  SNF;Supervision/Assistance - 24 hour    Equipment Recommendations  Other (comment) (TBD next venue of care)    Recommendations for Other Services  None at this time   Precautions / Restrictions Precautions Precautions: Fall;Knee Precaution Booklet Issued: Yes (comment) Precaution Comments: Reviewed precautions Required Braces or Orthoses: Knee Immobilizer - Right Knee Immobilizer - Right: On when out of bed or walking Restrictions Weight Bearing Restrictions: Yes [name]: Weight bearing as tolerated      Mobility Bed Mobility Overal bed mobility: Needs Assistance Bed Mobility: Supine to Sit;Sit to Supine     Supine to sit: Max assist;HOB elevated;Total assist Sit to supine: HOB elevated;Max assist   General bed mobility comments: Assist for BLEs management for bed mobility. Patient with right lateral lean and unable to tolerate sitting in upright position.   Transfers Overall transfer level: Needs assistance Equipment used: 2 person hand held assist Transfers: Sit to/from Stand Sit to Stand: Total assist;+2 physical assistance;Max assist         General  transfer comment: no transfer occurred during OT eval/treat secondary to patient with increased pain and inability to maintain sitting EOB without up to max assist at times.     Balance Overall balance assessment: Needs assistance (Simultaneous filing. User may not have seen previous data.) Sitting-balance support: Feet supported;No upper extremity supported (Simultaneous filing. User may not have seen previous data.) Sitting balance-Leahy Scale: Poor (Simultaneous filing. User may not have seen previous data.) Sitting balance - Comments: Min-Mod A for sitting balance. Right lateral trunk lean. Gripping rail with RUE. Able to get to midline ith Max A and VC's to lean on left elbow. Postural control: Right lateral lean Standing balance support: During functional activity Standing balance-Leahy Scale: Zero Standing balance comment: Total A of 2 to get to standing.    ADL Overall ADL's : Needs assistance/impaired Eating/Feeding: Set up;Bed level   Grooming: Set up;Bed level   Upper Body Bathing: Total assistance;Bed level   Lower Body Bathing: Total assistance;Bed level   Upper Body Dressing : Total assistance;Bed level   Lower Body Dressing: Total assistance;Bed level  General ADL Comments: No transfer occurred during OT eval/treat. Patient engaged in bed mobility and attempted to sit EOB, patient with right lateral lean and had difficulty sitting EOB without leaning onto right elbow. Patient unable to reach BLEs for LB ADLs at this time.     Pertinent Vitals/Pain Pain Assessment: Faces Faces Pain Scale: Hurts whole lot Pain Location: right knee during movement. Pain Descriptors / Indicators: Grimacing Pain Intervention(s): Limited activity within patient's tolerance;Monitored during session;Repositioned     Hand Dominance Right   Extremity/Trunk Assessment Upper Extremity Assessment Upper  Extremity Assessment: RUE deficits/detail RUE Deficits / Details: decreased AROM  throughout shoulder   Lower Extremity Assessment Lower Extremity Assessment: Defer to PT evaluation RLE Deficits / Details: AROM of ankle WFL. Other joints not assessed secondary to KI. RLE: Unable to fully assess due to immobilization RLE Sensation: decreased light touch  Communication Communication Communication: HOH   Cognition Arousal/Alertness: Awake/alert Behavior During Therapy: WFL for tasks assessed/performed Overall Cognitive Status: No family/caregiver present to determine baseline cognitive functioning Area of Impairment: Orientation;Memory;Safety/judgement;Awareness;Problem solving Orientation Level: Disoriented to;Time;Place   Memory: Decreased recall of precautions   Safety/Judgement: Decreased awareness of safety Awareness: Intellectual Problem Solving: Slow processing;Decreased initiation;Difficulty sequencing;Requires verbal cues;Requires tactile cues              Home Living Family/patient expects to be discharged to:: Private residence Living Arrangements: Other relatives Available Help at Discharge: Family;Personal care attendant;Available 24 hours/day Type of Home: House Home Access: Ramped entrance  Home Layout: One level     Bathroom Shower/Tub: Walk-in shower     Home Equipment: Walker - 2 wheels;Grab bars - tub/shower;Shower seat - built in   Additional Comments: pt lives with daughter who has MS and is bed ridden. They have 4 rotating caregivers and 24/7 supervision.       Prior Functioning/Environment Level of Independence: Needs assistance  Gait / Transfers Assistance Needed: pt with limited mobility using RW at home and assistance of caretaker. He mostly sits in a recliner during the day and gets to the bathroom, but otherwise no mobility until he returns to bed with help from the PCA.  ADL's / Homemaking Assistance Needed: Pt needs assist and has PCA at all times. They help with majority of bathing/dressing.    Comments: poor historian.  Patient unable to fully answer all questions. Most PLOF/social history obtained from PT evaluation ~ 1 month ago.    OT Diagnosis: Generalized weakness;Acute pain   OT Problem List: Decreased strength;Decreased range of motion;Decreased activity tolerance;Impaired balance (sitting and/or standing);Decreased coordination;Decreased safety awareness;Decreased knowledge of use of DME or AE;Decreased knowledge of precautions;Pain   OT Treatment/Interventions: Self-care/ADL training;Energy conservation;DME and/or AE instruction;Therapeutic exercise;Therapeutic activities;Patient/family education;Balance training    OT Goals(Current goals can be found in the care plan section) Acute Rehab OT Goals Patient Stated Goal: none stated OT Goal Formulation: With patient Time For Goal Achievement: 06/25/14 Potential to Achieve Goals: Good ADL Goals Pt Will Perform Grooming: with min assist;sitting Pt Will Perform Upper Body Bathing: with min assist;sitting Pt Will Perform Lower Body Bathing: with mod assist;sit to/from stand;with adaptive equipment Pt Will Perform Upper Body Dressing: with min assist;sitting Pt Will Perform Lower Body Dressing: with mod assist;sit to/from stand;with adaptive equipment Pt Will Transfer to Toilet: with mod assist;bedside commode;stand pivot transfer Pt Will Perform Toileting - Clothing Manipulation and hygiene: sit to/from stand;with mod assist  OT Frequency: Min 2X/week   Barriers to D/C: Unsure, patient poor historian   End of Session Activity Tolerance: Patient limited by pain Patient left: in bed;with call bell/phone within reach   Time: 1307-1330 OT Time Calculation (min): 23 min Charges:  OT General Charges $OT Visit: 1 Procedure OT Evaluation $Initial OT Evaluation Tier I: 1 Procedure OT Treatments $Self Care/Home Management : 8-22 mins  Pippa Hanif , MS, OTR/L, CLT Pager: [phone]  06/12/2014, 2:56 PM

## 2014-06-12 NOTE — Plan of Care (Signed)
Problem: Phase I Progression Outcomes Goal: OOB as tolerated unless otherwise ordered Outcome: Progressing Pt continues to be a max assist, able to tolerate small periods of standing but limited d/t pain.

## 2014-06-12 NOTE — Progress Notes (Signed)
Utilization review completed.  

## 2014-06-12 NOTE — Progress Notes (Signed)
Physical Therapy Treatment Patient Details Name: Randy [name] MRN: [id number] DOB: 09/20/1924 Today's Date: 06/12/2014    History of Present Illness Patient is a 81 y/o male s/p Right TKA with revision components remove spacer place. 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     PT Comments    Patient progressing very slowly with mobility. Able to obtain midline with Max manual cues and VC's to lean left but still requires external support to maintain static sitting balance. Total A of 2 to stand. Pt limited by pain. Discharge recommendation and frequency updated due to lack of progress and effort. Discharge recommendation updated to ST SNF and frequency decreased to QD. Will continue to follow and progress as tolerated.   Follow Up Recommendations  SNF;Supervision/Assistance - 24 hour     Equipment Recommendations  None recommended by PT    Recommendations for Other Services OT consult     Precautions / Restrictions Precautions Precautions: Fall;Knee Precaution Booklet Issued: Yes (comment) Precaution Comments: Reviewed precautions Required Braces or Orthoses: Knee Immobilizer - Right Knee Immobilizer - Right: On when out of bed or walking Restrictions Weight Bearing Restrictions: Yes [name]: Weight bearing as tolerated    Mobility  Bed Mobility Overal bed mobility: Needs Assistance Bed Mobility: Supine to Sit;Sit to Supine     Supine to sit: Max assist;HOB elevated;Total assist Sit to supine: HOB elevated;Max assist   General bed mobility comments: Assist for BLEs management for bed mobility. Patient with right lateral lean and unable to tolerate sitting in upright position.   Transfers Overall transfer level: Needs assistance Equipment used: 2 person hand held assist Transfers: Sit to/from Stand Sit to Stand: Total assist;+2 physical assistance;Max assist         General transfer comment: no transfer occurred  during OT eval/treat secondary to patient with increased pain and inability to maintain sitting EOB without up to max assist at times.   Ambulation/Gait                 Stairs            Wheelchair Mobility    Modified Rankin (Stroke Patients Only)       Balance Overall balance assessment: Needs assistance (Simultaneous filing. User may not have seen previous data.) Sitting-balance support: Feet supported;No upper extremity supported (Simultaneous filing. User may not have seen previous data.) Sitting balance-Leahy Scale: Poor (Simultaneous filing. User may not have seen previous data.) Sitting balance - Comments: Min-Mod A for sitting balance. Right lateral trunk lean. Gripping rail with RUE. Able to get to midline ith Max A and VC's to lean on left elbow. Postural control: Right lateral lean Standing balance support: During functional activity Standing balance-Leahy Scale: Zero Standing balance comment: Total A of 2 to get to standing.                    Cognition Arousal/Alertness: Awake/alert Behavior During Therapy: WFL for tasks assessed/performed Overall Cognitive Status: No family/caregiver present to determine baseline cognitive functioning Area of Impairment: Orientation;Memory;Safety/judgement;Awareness;Problem solving Orientation Level: Disoriented to;Time;Place   Memory: Decreased recall of precautions   Safety/Judgement: Decreased awareness of safety Awareness: Intellectual Problem Solving: Slow processing;Decreased initiation;Difficulty sequencing;Requires verbal cues;Requires tactile cues      Exercises Total Joint Exercises Ankle Circles/Pumps: Right;10 reps;Supine Quad Sets:  (Refused) Gluteal Sets: Both;5 reps;Supine    General Comments General comments (skin integrity, edema, etc.): Education provided  on importance of OOB mobility and HEP however pt reporting pain and "i cannot do any of that right now."      Pertinent  Vitals/Pain Pain Assessment: Faces Faces Pain Scale: Hurts whole lot Pain Location: right knee during movement. Pain Descriptors / Indicators: Grimacing Pain Intervention(s): Limited activity within patient's tolerance;Monitored during session;Repositioned    Home Living Family/patient expects to be discharged to:: Private residence Living Arrangements: Other relatives Available Help at Discharge: Family;Personal care attendant;Available 24 hours/day Type of Home: House Home Access: Ramped entrance   Home Layout: One level Home Equipment: Walker - 2 wheels;Grab bars - tub/shower;Shower seat - built in Additional Comments: pt lives with daughter who has MS and is bed ridden. They have 4 rotating caregivers and 24/7 supervision.     Prior Function Level of Independence: Needs assistance  Gait / Transfers Assistance Needed: pt with limited mobility using RW at home and assistance of caretaker. He mostly sits in a recliner during the day and gets to the bathroom, but otherwise no mobility until he returns to bed with help from the PCA.  ADL's / Homemaking Assistance Needed: Pt needs assist and has PCA at all times. They help with majority of bathing/dressing.  Comments: poor historian. Patient unable to fully answer all questions. Most PLOF/social history obtained from PT evaluation ~ 1 month ago.   PT Goals (current goals can now be found in the care plan section) Acute Rehab PT Goals Patient Stated Goal: none stated PT Goal Formulation: With patient Time For Goal Achievement: 06/26/14 Potential to Achieve Goals: Fair Progress towards PT goals: Progressing toward goals    Frequency  7X/week (QD)    PT Plan Frequency needs to be updated;Discharge plan needs to be updated    Co-evaluation             End of Session Equipment Utilized During Treatment: Gait belt;Right knee immobilizer Activity Tolerance: Patient limited by pain Patient left: in bed;with call bell/phone within  reach;with bed alarm set     Time: 8527-7824 PT Time Calculation (min) (ACUTE ONLY): 39 min  Charges:  $Therapeutic Activity: 23-37 mins $Neuromuscular Re-education: 8-22 mins                    G CodesCandy Sledge A Jun 22, 2014, 2:54 PM Candy Sledge, [place], DPT [phone]

## 2014-06-12 NOTE — Progress Notes (Signed)
Patient ID: Randy [name], male   DOB: October 10, 1924, 81 y.o.   MRN: [id number] PATIENT ID: Randy [name]  MRN: [id number]  DOB/AGE:  January 25, 1925 / 81 y.o.  1 Day Post-Op Procedure(s) (LRB): TOTAL KNEE ARTHROPLASTY WITH REVISION COMPONENTS REMOVE SPACER PLACE TKA (Right)    PROGRESS NOTE Subjective: Patient is alert, oriented, no Nausea, no Vomiting, yes passing gas, no Bowel Movement. Taking PO well. Denies SOB, Chest or Calf Pain. Using Incentive Spirometer, PAS in place. Ambulate WBAT,no CPM, AROM only, Knee immobilizer when not walking Patient reports pain as 6 on 0-10 scale  .    Objective: Vital signs in last 24 hours: Filed Vitals:   06/12/14 0000 06/12/14 0136 06/12/14 0359 06/12/14 0535  BP:  102/58  98/36  Pulse:  70  68  Temp:  97.4 F (36.3 C)  98.4 F (36.9 C)  TempSrc:      Resp: 16 16 16 16   Height:      Weight:      SpO2: 98% 98% 98% 98%      Intake/Output from previous day: I/O last 3 completed shifts: In: 1600 [I.V.:1600] Out: 100 [Blood:100]   Intake/Output this shift:     LABORATORY DATA:  Recent Labs  06/11/14 1013  06/11/14 1813 06/11/14 2109 06/12/14 0623  GLUCAP  --   < > 174* 128* 143*  INR 1.66*  --   --   --   --   < > = values in this interval not displayed.  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

## 2014-06-12 NOTE — Progress Notes (Signed)
Pharmacy -- Vancomycin (on prior to admission)  Vancomycin trough = 15.2 mcg/dL (therapeutic) Continues on 8 week course for septic joint  Plan: Continue Vancomycin 1250 mg iv Q 24 hours Next trough in 1 week per [place]  Thank you. Anette Guarneri, PharmD [phone]

## 2014-06-12 NOTE — Progress Notes (Signed)
[facility]  Patient Status: Active pt with [facility] prior to this readmission  [facility] is providing the following services: HHRN, OT, PT, MSW and [place] for home IV ABX. [facility] hospital team will follow [name] while inpatient and support transition home when deemed appropriate.  If patient discharges after hours, please call [phone].   [name] 06/12/2014, 9:12 AM

## 2014-06-12 NOTE — Evaluation (Signed)
Physical Therapy Evaluation Patient Details Name: Randy [name] MRN: [id number] DOB: Jan 14, 1925 Today's Date: 06/12/2014   History of Present Illness  Patient is a 81 y/o male s/p Right TKA with revision components remove spacer place. 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     Clinical Impression  Patient presents with post surgical deficits and pain RLE s/p above surgery. Pt with poor pain tolerance limiting mobility assessment. Education provided on HEP however pt refusing to perform quad sets secondary to pain. Not able to get to full upright sitting due to unwillingness/resistance and pain. Pt's daughter has MS and has around the clock caregivers. Pt reports he will be staying with her and will have 24/7 S at home at d/c. If not able to have 24/7 caregivers, will need ST SNF. Would benefit from skilled PT and HHPT to improve transfers, gait, balance and mobility so pt can maximize independence and ease burden of care prior to return home. Will attempt to see BID however pt may not be able to tolerate this level of activity.    Follow Up Recommendations Home health PT;Supervision/Assistance - 24 hour    Equipment Recommendations  None recommended by PT    Recommendations for Other Services OT consult     Precautions / Restrictions Precautions Precautions: Fall;Knee Precaution Booklet Issued: Yes (comment) Precaution Comments: Reviewed precautions and HEP. Required Braces or Orthoses: Knee Immobilizer - Right Knee Immobilizer - Right: On when out of bed or walking Restrictions Weight Bearing Restrictions: Yes [name]: Weight bearing as tolerated      Mobility  Bed Mobility Overal bed mobility: Needs Assistance Bed Mobility: Supine to Sit;Sit to Supine     Supine to sit: Max assist;HOB elevated;Total assist Sit to supine: Total assist;+2 for physical assistance;HOB elevated   General bed mobility comments: Increased time to  get to EOB. Assist with mobilize RLE, scoot bottom and elevate trunk. Not able to fully gain upright trunk sitting EOB due to resistance??   Transfers Overall transfer level:  (NA secondary to poor sitting tolerance and pain.)                  Ambulation/Gait                Stairs            Wheelchair Mobility    Modified Rankin (Stroke Patients Only)       Balance Overall balance assessment: Needs assistance Sitting-balance support: Feet supported;Bilateral upper extremity supported Sitting balance-Leahy Scale: Poor Sitting balance - Comments: Not able to obtain full upright sitting EOB due to pain. Leaning on Right forearm gripping handrail. Postural control: Right lateral lean     Standing balance comment: Not assessed.                             Pertinent Vitals/Pain Pain Assessment: Faces Faces Pain Scale: Hurts worst Pain Location: right knee with movement. Pain Descriptors / Indicators: Sore;Sharp;Aching Pain Intervention(s): Limited activity within patient's tolerance;Monitored during session;Premedicated before session;Repositioned    Home Living Family/patient expects to be discharged to:: Private residence Living Arrangements: Other relatives Available Help at Discharge: Family;Personal care attendant;Available 24 hours/day Type of Home: House Home Access: Ramped entrance     Home Layout: One level Home Equipment: Walker - 2 wheels;Shower seat;Grab bars - tub/shower Additional Comments: pt lives with daughter who has  MS and is bed ridden. They have 4 rotating caregivers and 24/7 supervision.     Prior Function Level of Independence: Needs assistance   Gait / Transfers Assistance Needed: pt with limited mobility using RW at home and assistance of caretaker. He mostly sits in a recliner during the day and gets to the bathroom, but otherwise no mobility until he returns to bed with help from the PCA.   ADL's / Homemaking  Assistance Needed: Pt needs assist and has PCA at all times. They help with majority of bathing/dressing.   Comments: poor historian. Most PLOF/social history obtained from PT evaluation ~ 1 month ago.     Hand Dominance        Extremity/Trunk Assessment   Upper Extremity Assessment: Defer to OT evaluation           Lower Extremity Assessment: RLE deficits/detail RLE Deficits / Details: AROM of ankle WFL. Other joints not assessed secondary to [place].       Communication   Communication: HOH  Cognition Arousal/Alertness: Awake/alert Behavior During Therapy: WFL for tasks assessed/performed Overall Cognitive Status: No family/caregiver present to determine baseline cognitive functioning (Pt poor historian.)                      General Comments General comments (skin integrity, edema, etc.): Education provided on importance of OOB mobility and HEP however pt reporting pain and "i cannot do any of that right now."    Exercises Total Joint Exercises Ankle Circles/Pumps: Right;10 reps;Supine Quad Sets:  (refused to attempt due to pain) Gluteal Sets: Both;5 reps;Supine      Assessment/Plan    PT Assessment Patient needs continued PT services  PT Diagnosis Acute pain   PT Problem List Decreased strength;Pain;Decreased range of motion;Impaired sensation;Decreased activity tolerance;Decreased balance;Decreased mobility  PT Treatment Interventions Balance training;Gait training;Functional mobility training;Patient/family education;Therapeutic activities;Therapeutic exercise;Wheelchair mobility training;DME instruction   PT Goals (Current goals can be found in the Care Plan section) Acute Rehab PT Goals Patient Stated Goal: to go home to stay with daughter and have 24/7 S PT Goal Formulation: With patient Time For Goal Achievement: 06/26/14 Potential to Achieve Goals: Fair    Frequency 7X/week (BID)   Barriers to discharge   Per prior notes, pt will have 24/7 S at  home from [place].    Co-evaluation               End of Session Equipment Utilized During Treatment: Gait belt;Right knee immobilizer Activity Tolerance: Patient limited by pain Patient left: in bed;with call bell/phone within reach;with bed alarm set Nurse Communication: Mobility status         Time: 1601-0932 PT Time Calculation (min) (ACUTE ONLY): 32 min   Charges:   PT Evaluation $Initial PT Evaluation Tier I: 1 Procedure PT Treatments $Therapeutic Activity: 8-22 mins   PT G CodesCandy Sledge A 06/20/2014, 11:25 AM Candy Sledge, PT, DPT [phone]

## 2014-06-13 LAB — GLUCOSE, CAPILLARY
Glucose-Capillary: 124 mg/dL — ABNORMAL HIGH (ref 70–99)
Glucose-Capillary: 143 mg/dL — ABNORMAL HIGH (ref 70–99)
Glucose-Capillary: 124 mg/dL — ABNORMAL HIGH (ref 70–99)

## 2014-06-13 LAB — CBC
HCT: 24 % — ABNORMAL LOW (ref 39.0–52.0)
Hemoglobin: 7.7 g/dL — ABNORMAL LOW (ref 13.0–17.0)
MCH: 27.5 pg (ref 26.0–34.0)
MCHC: 32.1 g/dL (ref 30.0–36.0)
MCV: 85.7 fL (ref 78.0–100.0)
PLATELETS: 299 10*3/uL (ref 150–400)
RBC: 2.8 MIL/uL — AB (ref 4.22–5.81)
RDW: 15.7 % — AB (ref 11.5–15.5)
WBC: 9.4 10*3/uL (ref 4.0–10.5)

## 2014-06-13 LAB — PROTIME-INR
INR: 1.89 — AB (ref 0.00–1.49)
PROTHROMBIN TIME: 21.8 s — AB (ref 11.6–15.2)

## 2014-06-13 MED ORDER — WARFARIN SODIUM 7.5 MG PO TABS
7.5000 mg | ORAL_TABLET | Freq: Once | ORAL | Status: AC
  Administered 2014-06-13: 7.5 mg via ORAL
  Filled 2014-06-13: qty 1

## 2014-06-13 NOTE — Progress Notes (Signed)
ANTICOAGULATION CONSULT NOTE - Follow Up Consult  Pharmacy Consult for Coumadin Indication: DVT history  No Known Allergies  Patient Measurements: Height: 5\' 7"  (170.2 cm) Weight: 163 lb (73.936 kg) IBW/kg (Calculated) : 66.1  Vital Signs: Temp: 98.3 F (36.8 C) (04/27 0431) BP: 122/82 mmHg (04/27 0431) Pulse Rate: 74 (04/27 0431)  Labs:  Recent Labs  06/11/14 1013 06/12/14 0908 06/13/14 0456  HGB  --  10.0* 7.7*  HCT  --  30.2* 24.0*  PLT  --  309 299  LABPROT 19.8*  --  21.8*  INR 1.66*  --  1.89*    Estimated Creatinine Clearance: 30.2 mL/min (by C-G formula based on Cr of 1.58).   Medical History: Past Medical History  Diagnosis Date  . Diabetes mellitus without complication   . Anxiety   . Hypertension   . Hyperlipidemia   . Presence of permanent cardiac pacemaker   . MI (myocardial infarction) 2014  . Arthritis     R knee,   . Dysrhythmia     PAF;,sss s/p St. Jude PPM  . CKD (chronic kidney disease)   . Coronary artery disease     Assessment: 81 year old male on Coumadin PTA for DVT history, now s/p revision of right knee arthroplasty.  INR today remains sub-therapeutic at 1.89 on home dose of Coumadin 5 mg daily     Goal of Therapy:  INR 2-3 Monitor platelets by anticoagulation protocol: Yes   Plan:  Coumadin 7.5 mg boost dose today. Resume home coumadin dose tomorrow  Daily INR  Albertina Parr, PharmD., BCPS Clinical Pharmacist Pager [phone]

## 2014-06-13 NOTE — Progress Notes (Signed)
PATIENT ID: Randy [name]  MRN: [id number]  DOB/AGE:  1924-07-14 / 81 y.o.  2 Days Post-Op Procedure(s) (LRB): TOTAL KNEE ARTHROPLASTY WITH REVISION COMPONENTS REMOVE SPACER PLACE TKA (Right)    PROGRESS NOTE Subjective: Patient is alert, oriented, no Nausea, no Vomiting, yes passing gas, no Bowel Movement. Taking PO well. Denies SOB, Chest or Calf Pain. Using Incentive Spirometer, PAS in place. Ambulate WBAT with walker and pt assistance,  Patient reports pain as 4 on 0-10 scale and 6 on 0-10 scale  .    Objective: Vital signs in last 24 hours: Filed Vitals:   06/12/14 2058 06/12/14 2346 06/13/14 0314 06/13/14 0431  BP: 129/91   122/82  Pulse: 68   74  Temp: 98 F (36.7 C)   98.3 F (36.8 C)  TempSrc:      Resp: 18 16 14 14   Height:      Weight:      SpO2: 100% 100% 100% 99%      Intake/Output from previous day: I/O last 3 completed shifts: In: 2040 [P.O.:240; I.V.:1500; IV Piggyback:300] Out: -    Intake/Output this shift:     LABORATORY DATA:  Recent Labs  06/11/14 1013  06/12/14 0908  06/12/14 1637 06/12/14 2143 06/13/14 0456 06/13/14 0625  WBC  --   --  13.0*  --   --   --  9.4  --   HGB  --   --  10.0*  --   --   --  7.7*  --   HCT  --   --  30.2*  --   --   --  24.0*  --   PLT  --   --  309  --   --   --  299  --   GLUCAP  --   < >  --   < > 130* 158*  --  124*  INR 1.66*  --   --   --   --   --  1.89*  --   < > = values in this interval not displayed.  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  Active ROM only. DVT Prophylaxis:  SCDx72hrs, ASA 325 mg BID x 2 weeks DISCHARGE PLAN: Home with daughter who has 3 full time nursing aides DISCHARGE NEEDS: HHPT, HHRN and 3-in-1 comode seat     [name] R 06/13/2014, 8:22 AM

## 2014-06-13 NOTE — Progress Notes (Signed)
Physical Therapy Treatment Patient Details Name: Randy [name] MRN: [id number] DOB: Jan 03, 1925 Today's Date: 06/13/2014    History of Present Illness Patient is a 81 y/o male s/p Right TKA with revision components remove spacer place. 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     PT Comments    Patient able to tolerate sitting EOB for longer periods without external support. Continues to require total A for all transfers/mobility. Continues to refuse OOB to chair even with max encouragement and education. Appropriate for ST SNF. Will continue to follow and progress as pt allows.   Follow Up Recommendations  SNF;Supervision/Assistance - 24 hour     Equipment Recommendations  None recommended by PT    Recommendations for Other Services OT consult     Precautions / Restrictions Precautions Precautions: Fall;Knee Precaution Booklet Issued: Yes (comment) Precaution Comments: Reviewed precautions Required Braces or Orthoses: Knee Immobilizer - Right Knee Immobilizer - Right: On when out of bed or walking Restrictions Weight Bearing Restrictions: Yes [name]: Weight bearing as tolerated    Mobility  Bed Mobility Overal bed mobility: Needs Assistance Bed Mobility: Supine to Sit;Sit to Supine     Supine to sit: Max assist;Total assist;+2 for physical assistance;HOB elevated Sit to supine: Max assist;Total assist;+2 for physical assistance;HOB elevated   General bed mobility comments: Assist for BLEs management for bed mobility. Patient with right lateral lean initially.  Transfers Overall transfer level: Needs assistance Equipment used: 2 person hand held assist Transfers: Sit to/from Stand Sit to Stand: Total assist;+2 physical assistance         General transfer comment: Total A to stand x3. Refused to transfer to chair.  Ambulation/Gait                 Stairs            Wheelchair Mobility    Modified  Rankin (Stroke Patients Only)       Balance Overall balance assessment: Needs assistance Sitting-balance support: Feet supported;No upper extremity supported Sitting balance-Leahy Scale: Poor Sitting balance - Comments: Min A for sitting balance. Right lateral trunk lean. Gripping rail with RUE. Able to get to midline with Max A and VC's to lean on left elbow. When distracted LOB to the right. Postural control: Right lateral lean Standing balance support: During functional activity Standing balance-Leahy Scale: Zero                      Cognition Arousal/Alertness: Awake/alert Behavior During Therapy: WFL for tasks assessed/performed Overall Cognitive Status: No family/caregiver present to determine baseline cognitive functioning                      Exercises General Exercises - Lower Extremity Ankle Circles/Pumps: Both;10 reps;Seated Long Arc Quad: AAROM;Right;10 reps;Seated    General Comments        Pertinent Vitals/Pain Pain Assessment: Faces Faces Pain Scale: Hurts whole lot Pain Location: right knee during movement Pain Descriptors / Indicators: Grimacing Pain Intervention(s): Limited activity within patient's tolerance;Monitored during session;Repositioned    Home Living                      Prior Function            PT Goals (current goals can now be found in the care plan section) Progress towards PT goals: Progressing toward goals    Frequency  7X/week    PT Plan Current plan remains appropriate    Co-evaluation             End of Session Equipment Utilized During Treatment: Gait belt;Right knee immobilizer Activity Tolerance: Patient limited by pain;Other (comment) (refusing mobility) Patient left: in bed;with call bell/phone within reach;with bed alarm set     Time: 9622-2979 PT Time Calculation (min) (ACUTE ONLY): 18 min  Charges:  $Therapeutic Activity: 8-22 mins                    G CodesCandy Sledge A June 15, 2014, 4:16 PM Wray Kearns, [place], DPT [phone]

## 2014-06-13 NOTE — Clinical Social Work Note (Signed)
Per RNCM, patient refusing SNF placement at time of discharge. RNCM states patient has discharge plan at home and is active with [facility]. CSW signing off.  Lubertha Sayres, [place] Cell: [phone]       Fax: [phone] Clinical Social Work: Orthopedics [phone]) and Surgical [phone])

## 2014-06-14 ENCOUNTER — Ambulatory Visit: Payer: Medicare Other | Admitting: Internal Medicine

## 2014-06-14 LAB — CBC
HCT: 24.1 % — ABNORMAL LOW (ref 39.0–52.0)
Hemoglobin: 7.7 g/dL — ABNORMAL LOW (ref 13.0–17.0)
MCH: 27.8 pg (ref 26.0–34.0)
MCHC: 32 g/dL (ref 30.0–36.0)
MCV: 87 fL (ref 78.0–100.0)
PLATELETS: 272 10*3/uL (ref 150–400)
RBC: 2.77 MIL/uL — AB (ref 4.22–5.81)
RDW: 15.7 % — AB (ref 11.5–15.5)
WBC: 7.8 10*3/uL (ref 4.0–10.5)

## 2014-06-14 LAB — GLUCOSE, CAPILLARY
GLUCOSE-CAPILLARY: 276 mg/dL — AB (ref 70–99)
Glucose-Capillary: 206 mg/dL — ABNORMAL HIGH (ref 70–99)
Glucose-Capillary: 99 mg/dL (ref 70–99)
Glucose-Capillary: 147 mg/dL — ABNORMAL HIGH (ref 70–99)
Glucose-Capillary: 126 mg/dL — ABNORMAL HIGH (ref 70–99)

## 2014-06-14 LAB — PROTIME-INR
INR: 1.78 — ABNORMAL HIGH (ref 0.00–1.49)
Prothrombin Time: 20.9 seconds — ABNORMAL HIGH (ref 11.6–15.2)

## 2014-06-14 MED ORDER — WARFARIN SODIUM 7.5 MG PO TABS
7.5000 mg | ORAL_TABLET | Freq: Once | ORAL | Status: AC
  Administered 2014-06-14: 7.5 mg via ORAL
  Filled 2014-06-14: qty 1

## 2014-06-14 NOTE — Progress Notes (Signed)
ANTICOAGULATION CONSULT NOTE - Follow Up Consult  Pharmacy Consult for Coumadin Indication: DVT history  No Known Allergies  Patient Measurements: Height: 5\' 7"  (170.2 cm) Weight: 163 lb (73.936 kg) IBW/kg (Calculated) : 66.1  Vital Signs:    Labs:  Recent Labs  06/12/14 0908 06/13/14 0456 06/14/14 0550  HGB 10.0* 7.7* 7.7*  HCT 30.2* 24.0* 24.1*  PLT 309 299 272  LABPROT  --  21.8* 20.9*  INR  --  1.89* 1.78*    Estimated Creatinine Clearance: 30.2 mL/min (by C-G formula based on Cr of 1.58).   Medical History: Past Medical History  Diagnosis Date  . Diabetes mellitus without complication   . Anxiety   . Hypertension   . Hyperlipidemia   . Presence of permanent cardiac pacemaker   . MI (myocardial infarction) 2014  . Arthritis     R knee,   . Dysrhythmia     PAF;,sss s/p St. Jude PPM  . CKD (chronic kidney disease)   . Coronary artery disease     Assessment: 81 year old male on Coumadin PTA for DVT history, now s/p revision of right knee arthroplasty.  INR today remains sub-therapeutic at 1.78 despite a boost dose of Coumadin 7.5 mg yesterday. H/H remains low but stable. Patient eating well.    Goal of Therapy:  INR 2-3 Monitor platelets by anticoagulation protocol: Yes   Plan:  Repeat Coumadin 7.5 mg boost dose today. Resume home coumadin dose tomorrow. If patient does not get today's dose here, will inform him to take boost dose at home.  Daily INR  Albertina Parr, PharmD., BCPS Clinical Pharmacist Pager [phone]

## 2014-06-14 NOTE — Progress Notes (Signed)
This RN and a NT entered pt room to clean pt, change linens, and replace condom catheter. When pt rolled RN found red open areas on pts sacrum, once cleaned pt found to have a stage 2 on sacral area. Barrier cream and a pink foam dressing applied to pts wound. Nursing will continue to monitor.

## 2014-06-14 NOTE — Progress Notes (Signed)
Patient ID: Randy [name], male   DOB: 05-04-1924, 81 y.o.   MRN: [id number] PATIENT ID: Randy [name]  MRN: [id number]  DOB/AGE:  07/19/1924 / 81 y.o.  3 Days Post-Op Procedure(s) (LRB): TOTAL KNEE ARTHROPLASTY WITH REVISION COMPONENTS REMOVE SPACER PLACE TKA (Right)    PROGRESS NOTE Subjective: Patient is alert, oriented, no Nausea, no Vomiting, yes passing gas, no Bowel Movement. Taking PO well, ate his entire breakfast the back and legs. Denies SOB, Chest or Calf Pain. Using Incentive Spirometer, PAS in place. Ambulate transfer with assist. Patient reports pain as 2 on 0-10 scale  .    Objective: Vital signs in last 24 hours: Filed Vitals:   06/13/14 2000 06/13/14 2137 06/13/14 2351 06/14/14 0331  BP:  102/68    Pulse:  79    Temp:  98 F (36.7 C)    TempSrc:  Oral    Resp: 16  16 14   Height:      Weight:      SpO2: 98% 100% 99% 99%      Intake/Output from previous day: I/O last 3 completed shifts: In: 2760 [P.O.:960; I.V.:1500; IV Piggyback:300] Out: 800 [Urine:800]   Intake/Output this shift:     LABORATORY DATA:  Recent Labs  06/13/14 0456  06/13/14 1645 06/13/14 2221 06/14/14 0550 06/14/14 0642  WBC 9.4  --   --   --  7.8  --   HGB 7.7*  --   --   --  7.7*  --   HCT 24.0*  --   --   --  24.1*  --   PLT 299  --   --   --  272  --   GLUCAP  --   < > 206* 124*  --  99  INR 1.89*  --   --   --  1.78*  --   < > = values in this interval not displayed.  Examination: Neurologically intact ABD soft Neurovascular intact Sensation intact distally Intact pulses distally Dorsiflexion/Plantar flexion intact Incision: no drainage No cellulitis present Compartment soft}  Assessment:   3 Days Post-Op Procedure(s) (LRB): TOTAL KNEE ARTHROPLASTY WITH REVISION COMPONENTS REMOVE SPACER PLACE TKA (Right), patient had removal of infected total knee 6 weeks ago, history of urosepsis in November with Escherichia coli, cultures of synovial fluid and intraoperative  cultures were negative.  He is had 6 weeks of IV antibiotics and his wound looked benign. ADDITIONAL DIAGNOSIS: Expected Acute Blood Loss Anemia, Diabetes, Hypertension and Renal Insufficiency Chronic, History of urosepsis in November that led to an infected right total knee, moderate dementia,, chronic.  Plan: PT/OT WBAT, CPM 5/hrs day until ROM 0-90 degrees, then D/C CPM DVT Prophylaxis:  SCDx72hrs, ASA 325 mg BID x 2 weeks DISCHARGE PLAN: Home, According the family.  Patient has round-the-clock nursing care at home and they would like to take him home. DISCHARGE NEEDS: HHPT and [place] 06/14/2014, 9:04 AM

## 2014-06-14 NOTE — Progress Notes (Addendum)
Physical Therapy Treatment Patient Details Name: Randy [name] MRN: [id number] DOB: 05/29/1924 Today's Date: 06/14/2014    History of Present Illness Patient is a 81 y/o male s/p Right TKA with revision components remove spacer place. 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     PT Comments    Pt was seen for updating of functional condition and was unable to fully stand to get to chair.  As of now he would be a hoyer transfer for staff to avoid issues with safety.  He is planning to go home with daughter but is not fully capable of helping to move to allow this.  Will continue daily PT visits  Follow Up Recommendations  SNF;Supervision/Assistance - 24 hour     Equipment Recommendations  None recommended by PT    Recommendations for Other Services       Precautions / Restrictions Precautions Precautions: Fall;Knee Precaution Booklet Issued: Yes (comment) Required Braces or Orthoses: Knee Immobilizer - Right Knee Immobilizer - Right: On at all times Restrictions Weight Bearing Restrictions: Yes [name]: Weight bearing as tolerated    Mobility  Bed Mobility Overal bed mobility: Needs Assistance;+2 for physical assistance;+ 2 for safety/equipment Bed Mobility: Supine to Sit     Supine to sit: Max assist Sit to supine: +2 for physical assistance;Max assist   General bed mobility comments: Swinging LE's to bedside esp RLE in immob  Transfers Overall transfer level: Needs assistance   Transfers: Sit to/from Stand Sit to Stand: Total assist;+2 physical assistance         General transfer comment: Pt is struggling to try to pull RLE under him and to extend R knee in immobilizer  Ambulation/Gait             General Gait Details: unable   Stairs            Wheelchair Mobility    Modified Rankin (Stroke Patients Only)       Balance Overall balance assessment: Needs assistance Sitting-balance support:  Feet supported;Bilateral upper extremity supported Sitting balance-Leahy Scale: Poor Sitting balance - Comments: R lateral trunk lean after assisting pt to stand, very lethargic effort to control sitting Postural control: Posterior lean Standing balance support: Bilateral upper extremity supported Standing balance-Leahy Scale: Zero                      Cognition Arousal/Alertness: Awake/alert (Confused) Behavior During Therapy: Anxious Overall Cognitive Status: Impaired/Different from baseline Area of Impairment: Orientation;Memory;Following commands;Safety/judgement;Awareness;Problem solving Orientation Level: Disoriented to;Time;Situation   Memory: Decreased short-term memory Following Commands: Follows one step commands inconsistently Safety/Judgement: Decreased awareness of safety Awareness: Intellectual Problem Solving: Slow processing;Difficulty sequencing;Decreased initiation;Requires verbal cues;Requires tactile cues General Comments: Pt thought he had received an amputation on his surgery leg, but was surprised he had not    Exercises      General Comments General comments (skin integrity, edema, etc.): Pt has cloudy urine in the catheter bag and talked with nursing about his confusion and issues with R side lean.  He apparently was the same last visit.      Pertinent Vitals/Pain Pain Assessment: Faces Pain Score: 5  Faces Pain Scale: Hurts even more Pain Location: R knee Pain Intervention(s): Limited activity within patient's tolerance;Monitored during session;Premedicated before session;Repositioned;Patient requesting pain meds-RN notified  BP after standing was 145/65, pulse 89.  Nsg informed.    Home Living  Prior Function            PT Goals (current goals can now be found in the care plan section) Acute Rehab PT Goals Patient Stated Goal: none stated Progress towards PT goals: Progressing toward goals    Frequency   7X/week    PT Plan Current plan remains appropriate    Co-evaluation             End of Session Equipment Utilized During Treatment: Gait belt;Right knee immobilizer Activity Tolerance: Patient limited by fatigue;Patient limited by pain;Patient limited by lethargy Patient left: in bed;with call bell/phone within reach;with bed alarm set     Time: 9924-2683 PT Time Calculation (min) (ACUTE ONLY): 23 min  Charges:  $Therapeutic Activity: 23-37 mins                    G Codes:      Ramond Dial 2014-06-29, 12:27 PM   Mee Hives, PT MS Acute Rehab Dept. Number: [phone]

## 2014-06-15 LAB — TISSUE CULTURE
CULTURE: NO GROWTH
Gram Stain: NONE SEEN

## 2014-06-15 LAB — PROTIME-INR
INR: 1.81 — ABNORMAL HIGH (ref 0.00–1.49)
Prothrombin Time: 21.1 seconds — ABNORMAL HIGH (ref 11.6–15.2)

## 2014-06-15 LAB — GLUCOSE, CAPILLARY
Glucose-Capillary: 136 mg/dL — ABNORMAL HIGH (ref 70–99)
Glucose-Capillary: 118 mg/dL — ABNORMAL HIGH (ref 70–99)

## 2014-06-15 MED ORDER — SULFAMETHOXAZOLE-TRIMETHOPRIM 800-160 MG PO TABS: 1.0000 | ORAL_TABLET | Freq: Every day | ORAL | Status: DC

## 2014-06-15 MED ORDER — WARFARIN SODIUM 7.5 MG PO TABS: 7.5000 mg | ORAL_TABLET | Freq: Once | ORAL | Status: DC

## 2014-06-15 NOTE — Progress Notes (Addendum)
Physical Therapy Treatment Patient Details Name: Randy [name] MRN: [id number] DOB: 1924-11-17 Today's Date: 06/15/2014    History of Present Illness Patient is a 81 y/o male s/p Right TKA with revision components remove spacer place. 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     PT Comments    Pt. With some improvement in his functional mobility today, though still limited to +2 max assist transfers.  PT frequency decreased to 5x/wk due to slow progress and DC disposition being SNF.  He felt like he did a little better today.    I noted edema of lower leg with some weeping of serous fluid .  I informed RN Caryl Asp and she stated she would come and look at the area as soon as she finished with another patient's care.  Follow Up Recommendations  SNF;Supervision/Assistance - 24 hour     Equipment Recommendations  None recommended by PT    Recommendations for Other Services       Precautions / Restrictions Precautions Precautions: Fall;Knee Precaution Comments: Reviewed precautions Required Braces or Orthoses: Knee Immobilizer - Right Knee Immobilizer - Right: On at all times Restrictions Weight Bearing Restrictions: Yes [name]: Weight bearing as tolerated    Mobility  Bed Mobility Overal bed mobility: Needs Assistance;+2 for physical assistance Bed Mobility: Supine to Sit     Supine to sit: Max assist;HOB elevated     General bed mobility comments: up in chair upon arrival into room  Transfers Overall transfer level: Needs assistance Equipment used: Rolling walker (2 wheeled) Transfers: Sit to/from Stand Sit to Stand: Total assist;+2 physical assistance Stand pivot transfers: Max assist;+2 physical assistance       General transfer comment: attempted sit/stand x3.  pt. unable to bring body into full standing.  R le sliding forward and total physical A x3 required and still unable to transtion into full standing.  tactile  guidance for hand placement but unable to follow cues for sequencing  Ambulation/Gait Ambulation/Gait assistance:  (pt. unable at this time)               Stairs            Wheelchair Mobility    Modified Rankin (Stroke Patients Only)       Balance                                    Cognition Arousal/Alertness: Awake/alert Behavior During Therapy: WFL for tasks assessed/performed Overall Cognitive Status: Impaired/Different from baseline Area of Impairment: Orientation;Memory;Following commands;Safety/judgement;Awareness;Problem solving Orientation Level: Disoriented to;Time;Situation   Memory: Decreased short-term memory Following Commands: Follows one step commands consistently Safety/Judgement: Decreased awareness of safety   Problem Solving: Slow processing;Difficulty sequencing;Decreased initiation;Requires verbal cues;Requires tactile cues General Comments: Pt. seemed more aware of the circumstances of his right LE    Exercises Total Joint Exercises Ankle Circles/Pumps: AROM;Both;10 reps    General Comments        Pertinent Vitals/Pain Pain Assessment: No/denies pain Faces Pain Scale: Hurts little more Pain Location: right knee Pain Descriptors / Indicators: Sore;Discomfort Pain Intervention(s): Limited activity within patient's tolerance;Repositioned;Monitored during session;Patient requesting pain meds-RN notified    Home Living                      Prior Function  PT Goals (current goals can now be found in the care plan section) Progress towards PT goals: Progressing toward goals    Frequency  Min 5X/week    PT Plan Current plan remains appropriate    Co-evaluation             End of Session Equipment Utilized During Treatment: Gait belt;Right knee immobilizer Activity Tolerance: Patient limited by fatigue (limited by weakness) Patient left: in chair;with call bell/phone within reach;with  chair alarm set     Time: [phone] PT Time Calculation (min) (ACUTE ONLY): 26 min  Charges:  $Therapeutic Activity: 23-37 mins                    G CodesLadona Ridgel 06/15/2014, 10:25 AM Gerlean Ren PT Acute Rehab Services [phone] Bee[place] [phone]

## 2014-06-15 NOTE — Clinical Social Work Note (Signed)
Clinical Social Work Assessment  Patient Details  Name: Randy [name] MRN: [id number] Date of Birth: 11/13/1924  Date of referral:  06/15/14               Reason for consult:  Facility Placement, Discharge Planning                Permission sought to share information with:  Family Supports Permission granted to share information::  Yes, Verbal Permission Granted  Name::     [name]  Agency::     Relationship::  Child Adult, daughter  Contact Information:  [phone]  Housing/Transportation Living arrangements for the past 2 months:  Mobile Home Source of Information:  Patient Patient Interpreter Needed:  None Criminal Activity/Legal Involvement Pertinent to Current Situation/Hospitalization:  No - Comment as needed (Not appropriate at this time.) Significant Relationships:  Adult Children Lives with:  Self Do you feel safe going back to the place where you live?  Yes (Patient discharging home with daughter.) Need for family participation in patient care:  Yes (Comment) (Patient discharging home with daughter)  Care giving concerns:  Patient refusing SNF placement throughout hospitalization. Patient requesting to discharge to patient's daughters home with home health services.   Social Worker assessment / plan:  Patient from home alone, but prefers to discharge to patient's daughters home with home health services. CSW and RNCM met with patient multiple times, patient insistent on discharging to patient's daughters. RNCM confirmed with patient's daughter, patient able to discharge to patient's daughters home once medically stable. CSW to arrange ambulance ([facility]) transportation.  Employment status:  Retired Forensic scientist:  Medicare PT Recommendations:  [place] / Referral to community resources:  [place]  Patient/Family's Response to care:  Patient and patient's daughter understanding and agreeable to CSW plan of  care.  Patient/Family's Understanding of and Emotional Response to Diagnosis, Current Treatment, and Prognosis:  Patient and patient's daughter understanding and agreeable to CSW plan of care.  Emotional Assessment Appearance:  Appears stated age Attitude/Demeanor/Rapport:  Inconsistent Affect (typically observed):  Pleasant, Appropriate, Calm Orientation:  Oriented to Self, Oriented to Place, Oriented to  Time, Oriented to Situation Alcohol / Substance use:  Not Applicable Psych involvement (Current and /or in the community):  No (Comment) (Not appropriate at this time)  Discharge Needs  Concerns to be addressed:  Care Coordination, Discharge Planning Concerns Readmission within the last 30 days:  No Current discharge risk:  None Barriers to Discharge:  No Barriers Identified   Caroline Sauger, LCSW 06/15/2014, 11:13 AM [phone]

## 2014-06-15 NOTE — Discharge Summary (Signed)
Patient ID: Randy [name] MRN: [id number] DOB/AGE: 79/14/1926 79 y.o.  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 Diabetes mellitus without complication   . Anxiety   . Hypertension   . Hyperlipidemia   . Presence of permanent cardiac pacemaker   . MI (myocardial infarction) 2014  . Arthritis     R knee,   . Dysrhythmia     PAF;,sss s/p St. Jude PPM  . CKD (chronic kidney disease)   . Coronary artery disease     Surgeries: Procedure(s): TOTAL KNEE ARTHROPLASTY WITH REVISION COMPONENTS REMOVE SPACER PLACE TKA on 06/11/2014   Consultants:    Discharged Condition: Improved  Hospital Course: Randy [name] is an 81 y.o. male who was admitted 06/11/2014 for operative treatment ofAcquired absence of knee joint following removal of joint prosthesis with presence of antibiotic-impregnated cement spacer. Patient has severe unremitting pain that affects sleep, daily activities, and work/hobbies. After pre-op clearance the patient was taken to the operating room on 06/11/2014 and underwent  Procedure(s): TOTAL KNEE ARTHROPLASTY WITH REVISION COMPONENTS REMOVE SPACER PLACE TKA.    Patient was given perioperative antibiotics: Anti-infectives    Start     Dose/Rate Route Frequency Ordered Stop   06/15/14 0000  sulfamethoxazole-trimethoprim (BACTRIM DS,SEPTRA DS) 800-160 MG per tablet     1 tablet Oral Daily 06/15/14 0943     06/11/14 2100  vancomycin (VANCOCIN) 1,250 mg in sodium chloride 0.9 % 250 mL IVPB     1,250 mg 166.7 mL/hr over 90 Minutes Intravenous Every 24 hours 06/11/14 1904     06/11/14 2100  ertapenem (INVANZ) 1 g in sodium chloride 0.9 % 50 mL IVPB     1 g 100 mL/hr over 30 Minutes  Intravenous Every 24 hours 06/11/14 1904     06/11/14 1529  cefUROXime (ZINACEF) injection  Status:  Discontinued       As needed 06/11/14 1529 06/11/14 1651   06/11/14 1529  tobramycin (NEBCIN) powder  Status:  Discontinued       As needed 06/11/14 1529 06/11/14 1651   06/11/14 0600  ceFAZolin (ANCEF) IVPB 2 g/50 mL premix     2 g 100 mL/hr over 30 Minutes Intravenous On call to O.R. 06/10/14 1428 06/11/14 1417       Patient was given sequential compression devices, early ambulation, and chemoprophylaxis to prevent DVT.  Patient benefited maximally from hospital stay and there were no complications.    Recent vital signs: Patient Vitals for the past 24 hrs:  BP Temp Temp src Pulse Resp SpO2  06/15/14 0800 - - - - 15 -  06/15/14 0400 - - - - 15 99 %  06/14/14 2334 - - - - 16 99 %  06/14/14 2248 (!) 109/35 mmHg 98.3 F (36.8 C) Oral 67 16 100 %  06/14/14 2000 - - - - 16 100 %  06/14/14 1300 (!) 127/50 mmHg 97.8 F (36.6 C) - 70 16 100 %     Recent laboratory studies:  Recent Labs  06/13/14 0456 06/14/14 0550 06/15/14 0510  WBC 9.4 7.8  --   HGB 7.7* 7.7*  --   HCT 24.0* 24.1*  --   PLT 299 272  --   INR 1.89*  1.78* 1.81*     Discharge Medications:     Medication List    STOP taking these medications        ertapenem 1 g in sodium chloride 0.9 % 50 mL     vancomycin 1,250 mg in sodium chloride 0.9 % 250 mL      TAKE these medications        A+D DIAPER RASH EX  Apply 1 application topically every 2 (two) hours as needed (diaper rash).     antiseptic oral rinse 0.05 % Liqd solution  Commonly known as:  CPC / CETYLPYRIDINIUM CHLORIDE 0.05%  7 mLs by Mouth Rinse route 2 (two) times daily.     atorvastatin 10 MG tablet  Commonly known as:  LIPITOR  Take 10 mg by mouth at bedtime.     busPIRone 15 MG tablet  Commonly known as:  BUSPAR  Take 1 tablet (15 mg total) by mouth 2 (two) times daily.     carvedilol 3.125 MG tablet  Commonly known as:  COREG   Take 3.125 mg by mouth 2 (two) times daily with a meal.     clopidogrel 75 MG tablet  Commonly known as:  PLAVIX  Take 75 mg by mouth daily.     eucerin cream  Apply 1 application topically as needed for dry skin.     EXUDERM LP 4"X4" Pads  Apply 1 each topically once a week.     feeding supplement (ENSURE COMPLETE) Liqd  Take 237 mLs by mouth 2 (two) times daily between meals.     insulin NPH Human 100 UNIT/ML injection  Commonly known as:  HUMULIN N,NOVOLIN N  Inject 14 Units into the skin 2 (two) times daily before a meal. With breakfast and dinner     miconazole 2 % powder  Commonly known as:  MICOTIN  Apply 1 application topically daily as needed for itching.     Naproxen Sodium 220 MG Caps  Take 440 capsules by mouth 3 (three) times daily as needed (knee pain).     oxyCODONE-acetaminophen 5-325 MG per tablet  Commonly known as:  ROXICET  Take 1 tablet by mouth every 4 (four) hours as needed.     ranitidine 150 MG capsule  Commonly known as:  ZANTAC  Take 150 mg by mouth 2 (two) times daily.     sulfamethoxazole-trimethoprim 800-160 MG per tablet  Commonly known as:  BACTRIM DS,SEPTRA DS  Take 1 tablet by mouth daily.     tamsulosin 0.4 MG Caps capsule  Commonly known as:  FLOMAX  Take 1 capsule (0.4 mg total) by mouth daily.     tizanidine 2 MG capsule  Commonly known as:  ZANAFLEX  Take 1 capsule (2 mg total) by mouth 3 (three) times daily.     warfarin 5 MG tablet  Commonly known as:  COUMADIN  Take 5 mg by mouth daily.        Diagnostic Studies: No results found.  Disposition: 01-Home or Self Care      Discharge Instructions    Call MD / Call 911    Complete by:  As directed   If you experience chest pain or shortness of breath, CALL 911 and be transported to the hospital emergency room.  If you develope a fever above 101 F, pus (white drainage) or increased drainage or redness at the wound, or calf pain, call your surgeon's office.     Change  dressing    Complete by:  As directed   Change dressing on 5, then change the dressing daily with sterile 4 x 4 inch gauze dressing and apply TED hose.  You may clean the incision with alcohol prior to redressing.     Constipation Prevention    Complete by:  As directed   Drink plenty of fluids.  Prune juice may be helpful.  You may use a stool softener, such as Colace (over the counter) 100 mg twice a day.  Use MiraLax (over the counter) for constipation as needed.     Diet - low sodium heart healthy    Complete by:  As directed      Discharge instructions    Complete by:  As directed   Follow up in office with Dr. Mayer Camel in 2 weeks.  Pt to start Septra Ds once daily orally for 6 weeks.     Driving restrictions    Complete by:  As directed   No driving for 2 weeks     Increase activity slowly as tolerated    Complete by:  As directed      Patient may shower    Complete by:  As directed   You may shower without a dressing once there is no drainage.  Do not wash over the wound.  If drainage remains, cover wound with plastic wrap and then shower.           Follow-up Information    Follow up with Kerin Salen, MD In 2 weeks.   Specialty:  Orthopedic Surgery   Contact information:   [place] [address] [phone]        Signed: Theodosia Quay 06/15/2014, 9:43 AM

## 2014-06-15 NOTE — Progress Notes (Signed)
Coffeeground bile in NG tube. [employer], PA,notified. She said to keep en eye on it for now, and if it keeps coming out coffee like instead of green, to call her back.

## 2014-06-15 NOTE — Care Management Note (Signed)
CARE MANAGEMENT NOTE 06/15/2014  Patient:  Randy [name], Randy [name]   Account Number:  [id number]  Date Initiated:  06/12/2014  Documentation initiated by:  Randy [name]  Subjective/Objective Assessment:   81 yr old male admitted retained spacer in right total knee. patient underwent a right total knee revision.     Action/Plan:   Case manager will contact patient dtg. concerning 24/7 support.  Patient active with [facility], no changes.Patient doesnt want SNF.   Anticipated DC Date:  06/15/2014   Anticipated DC Plan:  [place]  CM consult      [facility] Choice  HOME HEALTH   Choice offered to / List presented to:     DME arranged  NA        [place] arranged  [place].   Status of service:  Completed, signed off Medicare Important Message given?  YES (If response is "NO", the following Medicare IM given date fields will be blank) Date Medicare IM given:  06/15/2014 Medicare IM given by:  Randy [name] Date Additional Medicare IM given:   Additional Medicare IM given by:    Discharge Disposition:  Randy [name]  Per UR Regulation:  Reviewed for med. necessity/level of care/duration of stay

## 2014-06-15 NOTE — Progress Notes (Signed)
Occupational Therapy Treatment Patient Details Name: Randy [name] MRN: [id number] DOB: [birth year] Today's Date: 06/15/2014    History of present illness Patient is a 81 y/o male s/p Right TKA with revision components remove spacer place. 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    OT comments  Pt. With slow progress with skilled OT.  Attempted sit/stand x 3 today as pre-transfer and pre-amb. Component.  Pt. Still requiring total A x2 for physical assist with significant safety concerns due to pts. Inability to manage RLE during transitional movements.    Follow Up Recommendations  SNF    Equipment Recommendations       Recommendations for Other Services      Precautions / Restrictions Precautions Precautions: Fall;Knee Precaution Comments: Reviewed precautions Required Braces or Orthoses: Knee Immobilizer - Right Knee Immobilizer - Right: On at all times Restrictions Weight Bearing Restrictions: Yes [name]: Weight bearing as tolerated       Mobility Bed Mobility Overal bed mobility: Needs Assistance;+2 for physical assistance Bed Mobility: Supine to Sit     Supine to sit: Max assist;HOB elevated     General bed mobility comments: up in chair upon arrival into room  Transfers Overall transfer level: Needs assistance Equipment used: Rolling walker (2 wheeled) Transfers: Sit to/from Stand Sit to Stand: Total assist;+2 physical assistance         General transfer comment: attempted sit/stand x3.  pt. unable to bring body into full standing.  R le sliding forward and total physical A x3 required and still unable to transtion into full standing.  tactile guidance for hand placement but unable to follow cues for sequencing    Balance                                   ADL Overall ADL's : Needs assistance/impaired                                       General ADL Comments: attempted  sit/stand as part of pre-transfer components pt. unable to perform safely (refer to mobility portion of note)      Vision                     Perception     Praxis      Cognition   Behavior During Therapy: [facility] for tasks assessed/performed Overall Cognitive Status: Impaired/Different from baseline Area of Impairment: Orientation;Memory;Following commands;Safety/judgement;Awareness;Problem solving Orientation Level: Disoriented to;Time;Situation   Memory: Decreased short-term memory  Following Commands: Follows one step commands consistently Safety/Judgement: Decreased awareness of safety   Problem Solving: Slow processing;Difficulty sequencing;Decreased initiation;Requires verbal cues;Requires tactile cues General Comments: Pt. seemed more aware of the circumstances of his right LE    Extremity/Trunk Assessment               Exercises     Shoulder Instructions       General Comments      Pertinent Vitals/ Pain       Pain Assessment: No/denies pain Faces Pain Scale: Hurts little more Pain Location: right knee Pain Descriptors / Indicators: Sore;Discomfort Pain Intervention(s): Limited activity within patient's tolerance;Repositioned;Monitored during session;Patient requesting pain meds-RN notified  Home Living  Prior Functioning/Environment              Frequency Min 2X/week     Progress Toward Goals  OT Goals(current goals can now be found in the care plan section)  Progress towards OT goals: Progressing toward goals     Plan Discharge plan remains appropriate    Co-evaluation                 End of Session Equipment Utilized During Treatment: Gait belt;Rolling walker;Right knee immobilizer   Activity Tolerance Patient limited by fatigue   Patient Left in chair;with call bell/phone within reach;with chair alarm set   Nurse Communication          Time:  [phone] OT Time Calculation (min): 9 min  Charges: OT General Charges $OT Visit: 1 Procedure OT Treatments $Self Care/Home Management : 8-22 mins  Janice Coffin, COTA/L 06/15/2014, 9:54 AM

## 2014-06-15 NOTE — Progress Notes (Addendum)
PATIENT ID: DELRAY REZA  MRN: [id number]  DOB/AGE:  04/05/1924 / 81 y.o.  4 Days Post-Op Procedure(s) (LRB): TOTAL KNEE ARTHROPLASTY WITH REVISION COMPONENTS REMOVE SPACER PLACE TKA (Right)    PROGRESS NOTE Subjective: Patient is alert, oriented, no Nausea, no Vomiting, yes passing gas, yes Bowel Movement. Taking PO well. Denies SOB, Chest or Calf Pain. Using Incentive Spirometer, PAS in place. Ambulate WBAT with pt up with therapy in room, no CPM. Patient reports pain as 6 on 0-10 scale and 7 on 0-10 scale  .    Objective: Vital signs in last 24 hours: Filed Vitals:   06/14/14 2248 06/14/14 2334 06/15/14 0400 06/15/14 0800  BP: 109/35     Pulse: 67     Temp: 98.3 F (36.8 C)     TempSrc: Oral     Resp: 16 16 15 15   Height:      Weight:      SpO2: 100% 99% 99%       Intake/Output from previous day: I/O last 3 completed shifts: In: 480 [P.O.:480] Out: 2350 [Urine:2350]   Intake/Output this shift:     LABORATORY DATA:  Recent Labs  06/13/14 0456  06/14/14 0550  06/14/14 1622 06/14/14 2251 06/15/14 0510 06/15/14 0624  WBC 9.4  --  7.8  --   --   --   --   --   HGB 7.7*  --  7.7*  --   --   --   --   --   HCT 24.0*  --  24.1*  --   --   --   --   --   PLT 299  --  272  --   --   --   --   --   GLUCAP  --   < >  --   < > 276* 126*  --  136*  INR 1.89*  --  1.78*  --   --   --  1.81*  --   < > = values in this interval not displayed.  Examination: Neurologically intact Neurovascular intact Sensation intact distally Intact pulses distally Dorsiflexion/Plantar flexion intact Incision: dressing C/D/I No cellulitis present Compartment soft} Echymosis in lower leg.  Assessment:   4 Days Post-Op Procedure(s) (LRB): TOTAL KNEE ARTHROPLASTY WITH REVISION COMPONENTS REMOVE SPACER PLACE TKA (Right) ADDITIONAL DIAGNOSIS: Expected Acute Blood Loss Anemia, Diabetes, Hypertension and Renal Insufficiency Chronic, History of urosepsis in November that led to an infected  right total knee, moderate dementia.   Plan: PT/OT WBAT, active ROM only.  No CPM. DVT Prophylaxis:  SCDx72hrs, Pt to restart normal coumadin dose. DISCHARGE PLAN: Skilled Nursing Facility/Rehab, when bed available.  FL 2 signed and in chart. DISCHARGE NEEDS: HHPT and Walker  D/C PICC line.  Pt to switch to oral Septra DS one tablet daily for 6 weeks.     Bernadene Garside R 06/15/2014, 9:24 AM

## 2014-06-15 NOTE — Progress Notes (Signed)
ANTICOAGULATION CONSULT NOTE - Follow Up Consult  Pharmacy Consult for Coumadin Indication: DVT history  No Known Allergies  Patient Measurements: Height: 5\' 7"  (170.2 cm) Weight: 163 lb (73.936 kg) IBW/kg (Calculated) : 66.1  Vital Signs: Temp: 98.3 F (36.8 C) (04/28 2248) Temp Source: Oral (04/28 2248) BP: 109/35 mmHg (04/28 2248) Pulse Rate: 67 (04/28 2248)  Labs:  Recent Labs  06/12/14 0908 06/13/14 0456 06/14/14 0550 06/15/14 0510  HGB 10.0* 7.7* 7.7*  --   HCT 30.2* 24.0* 24.1*  --   PLT 309 299 272  --   LABPROT  --  21.8* 20.9* 21.1*  INR  --  1.89* 1.78* 1.81*    Estimated Creatinine Clearance: 30.2 mL/min (by C-G formula based on Cr of 1.58).   Medical History: Past Medical History  Diagnosis Date  . Diabetes mellitus without complication   . Anxiety   . Hypertension   . Hyperlipidemia   . Presence of permanent cardiac pacemaker   . MI (myocardial infarction) 2014  . Arthritis     R knee,   . Dysrhythmia     PAF;,sss s/p St. Jude PPM  . CKD (chronic kidney disease)   . Coronary artery disease     Assessment: 81 year old male on Coumadin PTA for DVT history, now s/p revision of right knee arthroplasty.  INR today remains sub-therapeutic at 1.81 despite two doses of Coumadin 7.5 mg but INR trending up now. H/H remains low but stable. Patient eating well.    Goal of Therapy:  INR 2-3 Monitor platelets by anticoagulation protocol: Yes   Plan:  -Repeat Coumadin 7.5 mg boost dose today. Likely could resume home coumadin dose tomorrow.  -Daily INR  Albertina Parr, PharmD., BCPS Clinical Pharmacist Pager [phone]

## 2014-06-26 ENCOUNTER — Ambulatory Visit: Payer: Medicare Other | Admitting: Internal Medicine

## 2014-06-29 ENCOUNTER — Encounter (HOSPITAL_COMMUNITY): Payer: Self-pay | Admitting: Orthopedic Surgery

## 2014-06-29 NOTE — Addendum Note (Signed)
Addendum  created 06/29/14 1749 by Finis Bud, MD   Modules edited: Anesthesia Events

## 2016-03-11 IMAGING — US US RENAL
1 series · 14 of 25 positions shown · non-contrast
Comparison: None.

CLINICAL DATA: Acute kidney injury. History of diabetes,
hypertension, hyperlipidemia. Incontinence.

EXAM:
RENAL/URINARY TRACT ULTRASOUND COMPLETE

[Series 1: us renal · 0.27mm/px · 14 of 37 slices shown]
[im 1/37]
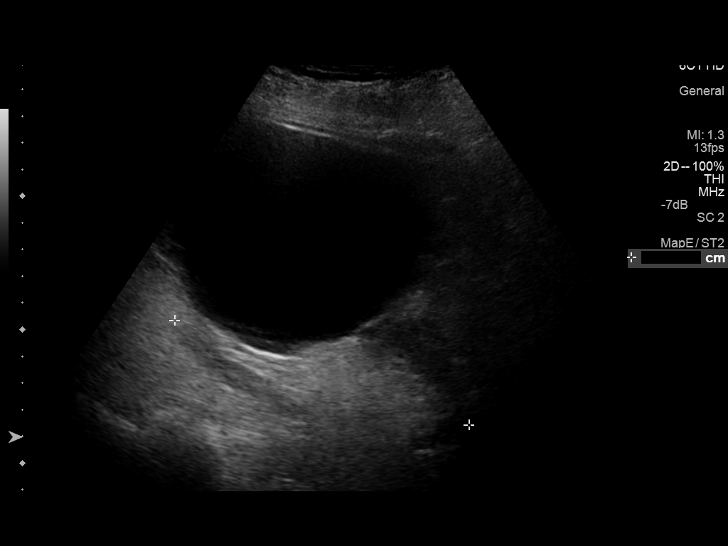
[im 4/37]
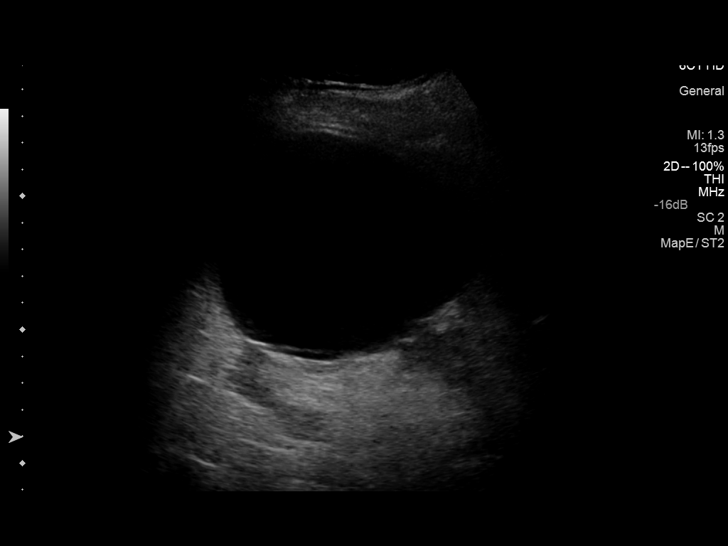
[im 7/37]
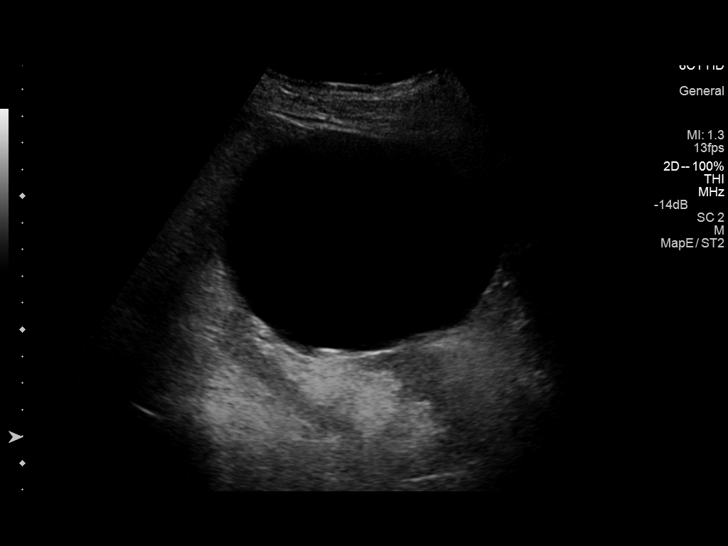
[im 10/37]
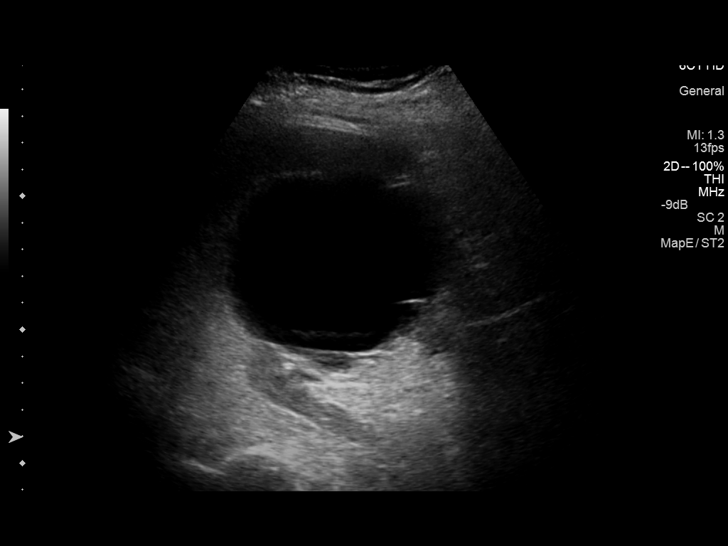
[im 13/37]
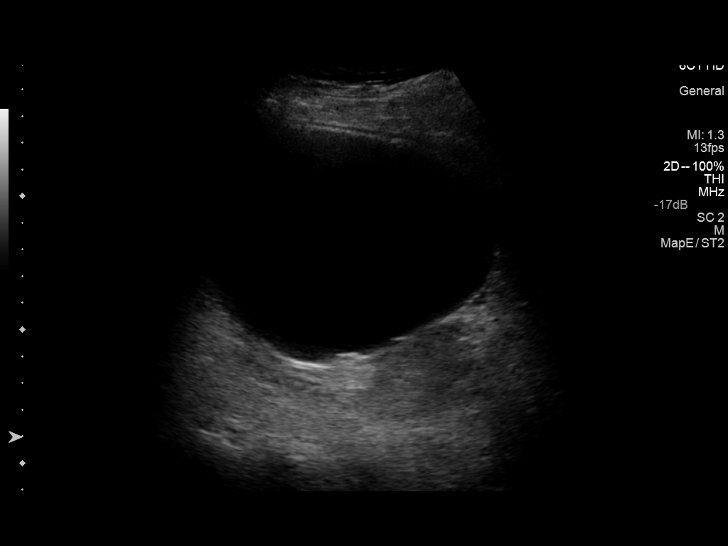
[im 14/37]
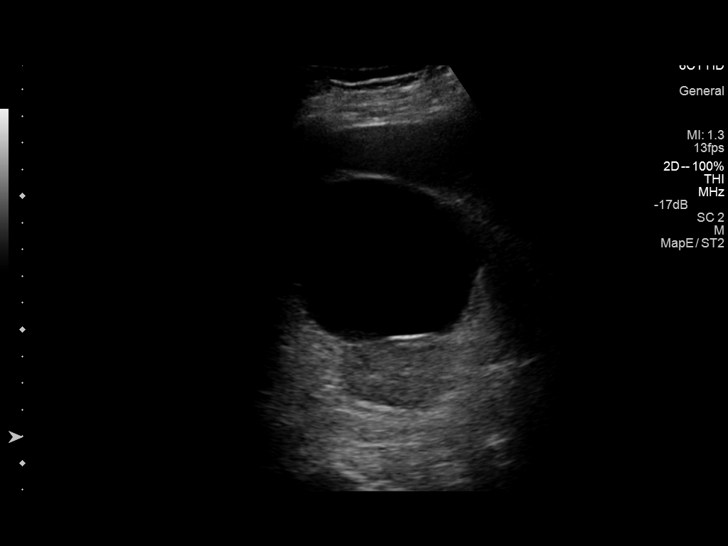
[im 17/37]
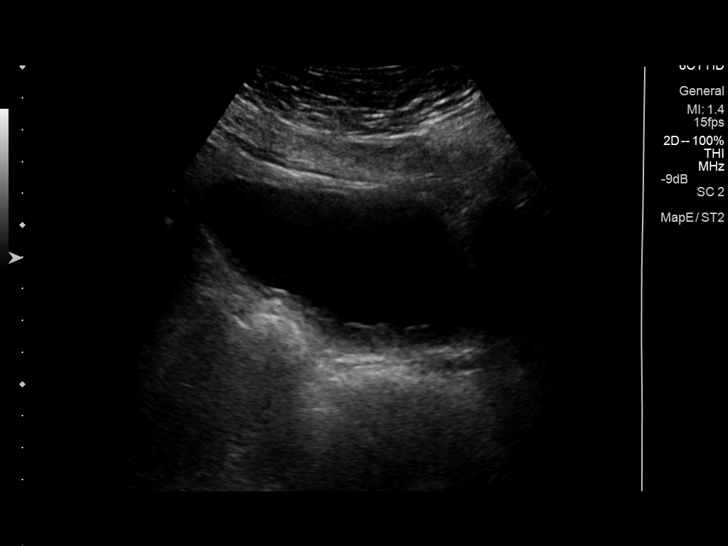
[im 20/37]
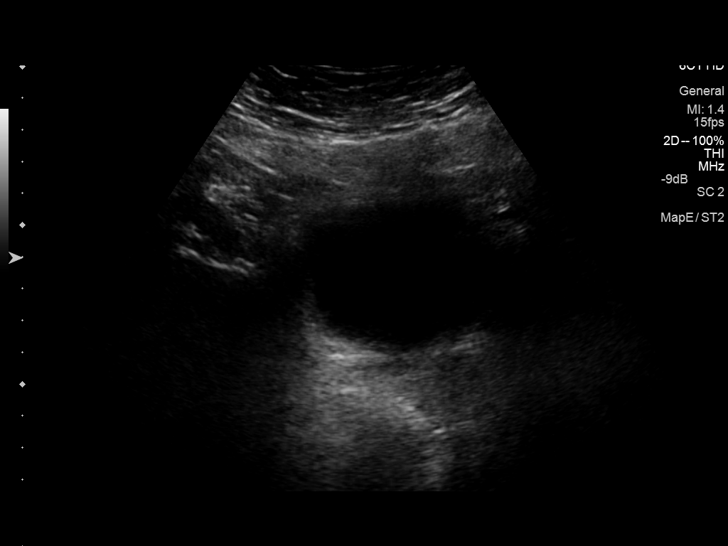
[im 23/37]
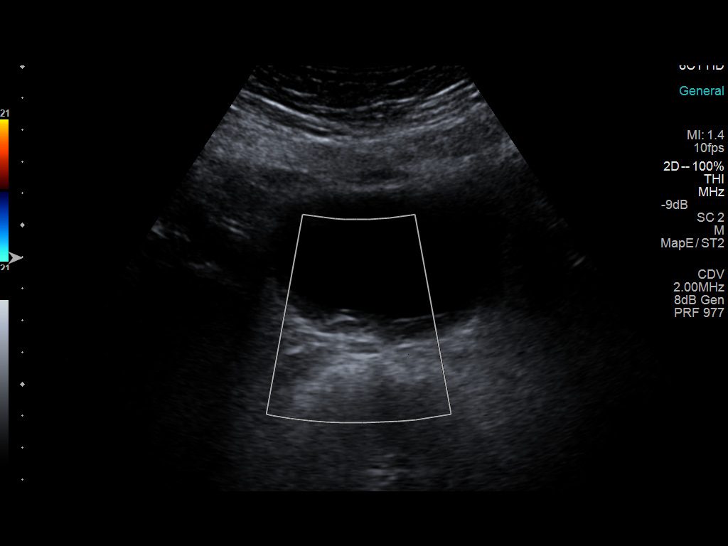
[im 25/37]
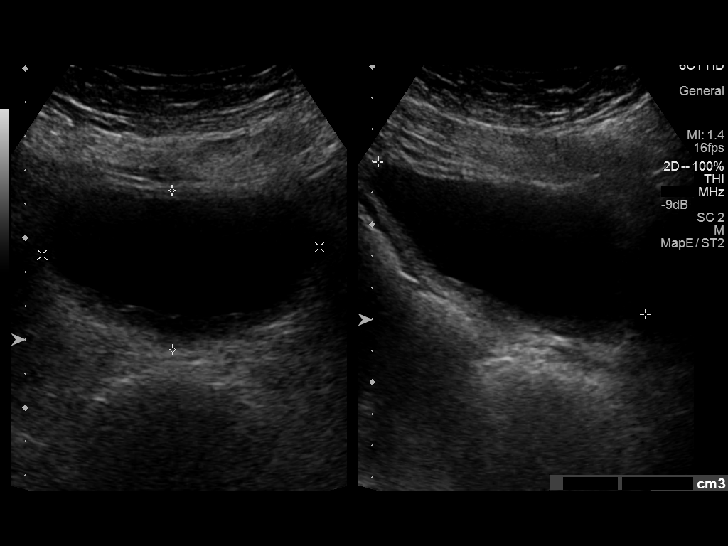
[im 28/37]
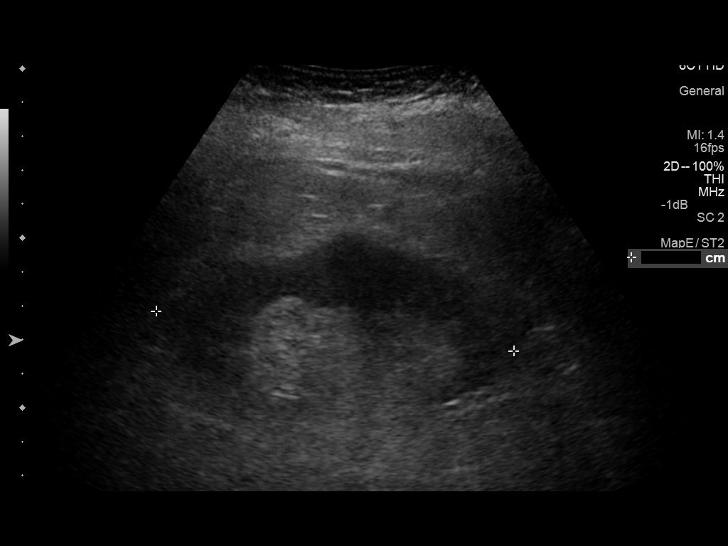
[im 31/37]
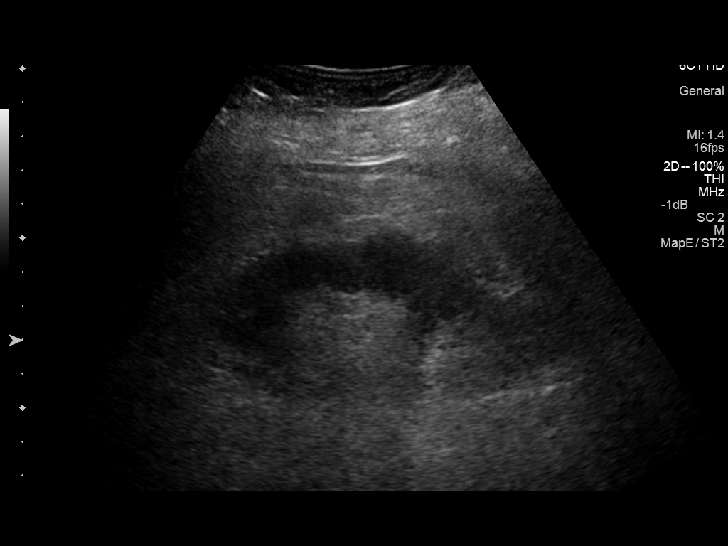
[im 34/37]
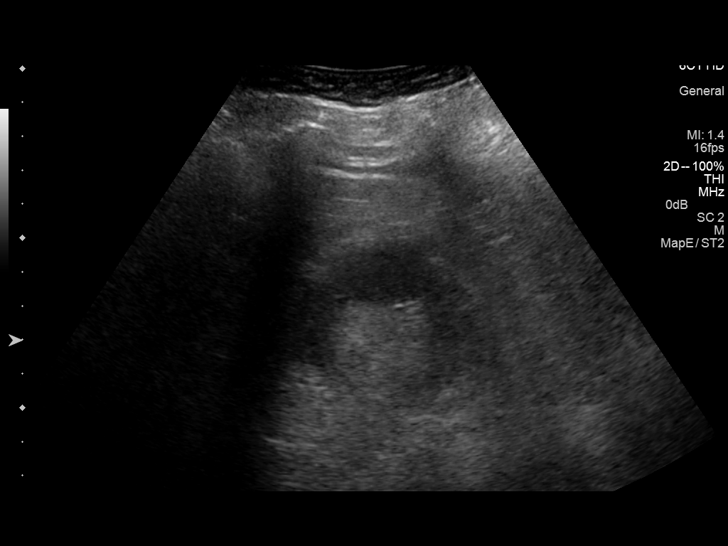
[im 37/37]
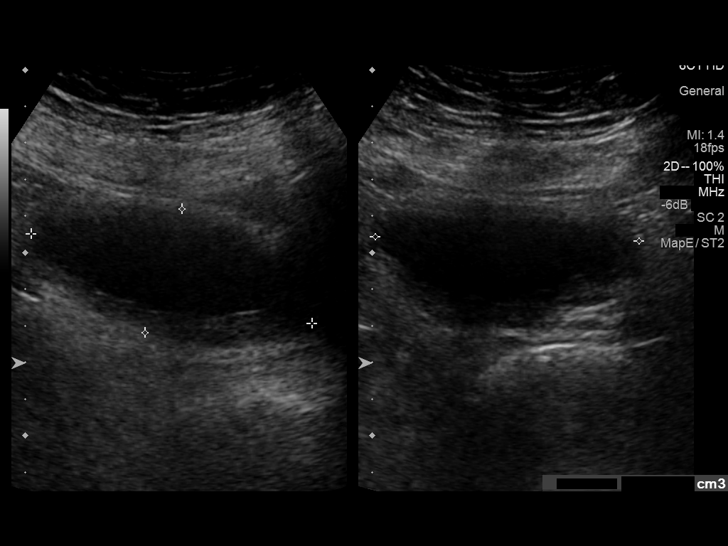

[14 of 25 positions shown; findings below may reference images not displayed]

FINDINGS: Right Kidney:

Length: 11.7 cm. Echogenicity is normal. Large upper pole cyst is
10.8 x 8.1 x 10.4 cm. No hydronephrosis.

Left Kidney:

Length: 11.0 cm. Echogenicity within normal limits. No mass or
hydronephrosis visualized.

Bladder:

Within the posterior aspect of the bladder there is echogenic
debris. Prevoid volume is calculated to be 196 cc. Postvoid residual
is 107 cc.
IMPRESSION: 1. Large right upper pole cyst, 10.8 cm.
2. No hydronephrosis.
3. Significant postvoid residual calculated to be 107 cc.
4. Debris within the bladder.

## 2016-04-17 ENCOUNTER — Encounter (HOSPITAL_COMMUNITY): Payer: Self-pay | Admitting: Emergency Medicine

## 2016-04-17 ENCOUNTER — Emergency Department (HOSPITAL_COMMUNITY)
Admission: EM | Admit: 2016-04-17 | Discharge: 2016-04-17 | Disposition: A | Discharge status: Home / Self Care | Payer: Medicare Other | Attending: Emergency Medicine | Admitting: Emergency Medicine

## 2016-04-17 ENCOUNTER — Emergency Department (HOSPITAL_COMMUNITY): Payer: Medicare Other

## 2016-04-17 DIAGNOSIS — I129 Hypertensive chronic kidney disease with stage 1 through stage 4 chronic kidney disease, or unspecified chronic kidney disease: Secondary | ICD-10-CM | POA: Insufficient documentation

## 2016-04-17 DIAGNOSIS — Z96651 Presence of right artificial knee joint: Secondary | ICD-10-CM | POA: Insufficient documentation

## 2016-04-17 DIAGNOSIS — N183 Chronic kidney disease, stage 3 (moderate): Secondary | ICD-10-CM | POA: Insufficient documentation

## 2016-04-17 DIAGNOSIS — Z950 Presence of cardiac pacemaker: Secondary | ICD-10-CM | POA: Insufficient documentation

## 2016-04-17 DIAGNOSIS — Z955 Presence of coronary angioplasty implant and graft: Secondary | ICD-10-CM | POA: Diagnosis not present

## 2016-04-17 DIAGNOSIS — Z7901 Long term (current) use of anticoagulants: Secondary | ICD-10-CM | POA: Diagnosis not present

## 2016-04-17 DIAGNOSIS — Z7902 Long term (current) use of antithrombotics/antiplatelets: Secondary | ICD-10-CM | POA: Insufficient documentation

## 2016-04-17 DIAGNOSIS — Z794 Long term (current) use of insulin: Secondary | ICD-10-CM | POA: Diagnosis not present

## 2016-04-17 DIAGNOSIS — Z87891 Personal history of nicotine dependence: Secondary | ICD-10-CM | POA: Diagnosis not present

## 2016-04-17 DIAGNOSIS — I2510 Atherosclerotic heart disease of native coronary artery without angina pectoris: Secondary | ICD-10-CM | POA: Insufficient documentation

## 2016-04-17 DIAGNOSIS — Z79899 Other long term (current) drug therapy: Secondary | ICD-10-CM | POA: Diagnosis not present

## 2016-04-17 DIAGNOSIS — I252 Old myocardial infarction: Secondary | ICD-10-CM | POA: Insufficient documentation

## 2016-04-17 DIAGNOSIS — K5900 Constipation, unspecified: Secondary | ICD-10-CM | POA: Diagnosis not present

## 2016-04-17 DIAGNOSIS — R1031 Right lower quadrant pain: Secondary | ICD-10-CM | POA: Diagnosis present

## 2016-04-17 DIAGNOSIS — E1122 Type 2 diabetes mellitus with diabetic chronic kidney disease: Secondary | ICD-10-CM | POA: Diagnosis not present

## 2016-04-17 LAB — URINALYSIS, ROUTINE W REFLEX MICROSCOPIC
Bilirubin Urine: NEGATIVE
Glucose, UA: >=500 mg/dL — AB
Hgb urine dipstick: NEGATIVE
Ketones, ur: NEGATIVE mg/dL
Nitrite: NEGATIVE
Protein, ur: NEGATIVE mg/dL
Specific Gravity, Urine: 1.013 (ref 1.005–1.030)
pH: 5 (ref 5.0–8.0)

## 2016-04-17 LAB — CBC
HEMATOCRIT: 42.9 % (ref 39.0–52.0)
Hemoglobin: 14.1 g/dL (ref 13.0–17.0)
MCH: 28 pg (ref 26.0–34.0)
MCHC: 32.9 g/dL (ref 30.0–36.0)
MCV: 85.3 fL (ref 78.0–100.0)
PLATELETS: 289 10*3/uL (ref 150–400)
RBC: 5.03 MIL/uL (ref 4.22–5.81)
RDW: 14.5 % (ref 11.5–15.5)
WBC: 9 10*3/uL (ref 4.0–10.5)

## 2016-04-17 LAB — BASIC METABOLIC PANEL
ANION GAP: 8 (ref 5–15)
BUN: 22 mg/dL — ABNORMAL HIGH (ref 6–20)
CO2: 26 mmol/L (ref 22–32)
Calcium: 9.1 mg/dL (ref 8.9–10.3)
Chloride: 102 mmol/L (ref 101–111)
Creatinine, Ser: 1.34 mg/dL — ABNORMAL HIGH (ref 0.61–1.24)
GFR calc Af Amer: 52 mL/min — ABNORMAL LOW (ref >60)
GFR, EST NON AFRICAN AMERICAN: 45 mL/min — AB (ref >60)
Glucose, Bld: 306 mg/dL — ABNORMAL HIGH (ref 65–99)
Potassium: 4.3 mmol/L (ref 3.5–5.1)
SODIUM: 136 mmol/L (ref 135–145)

## 2016-04-17 MED ORDER — MORPHINE SULFATE (PF) 4 MG/ML IV SOLN
4.0000 mg | Freq: Once | INTRAVENOUS | Status: AC
  Administered 2016-04-17: 4 mg via INTRAVENOUS
  Filled 2016-04-17: qty 1

## 2016-04-17 MED ORDER — POLYETHYLENE GLYCOL 3350 17 G PO PACK: 17.0000 g | PACK | Freq: Every day | ORAL | 0 refills | Status: DC

## 2016-04-17 MED ORDER — MINERAL OIL RE ENEM
1.0000 | ENEMA | Freq: Once | RECTAL | Status: DC
  Filled 2016-04-17: qty 1

## 2016-04-17 NOTE — ED Notes (Signed)
Bed: WA24 Expected date:  Expected time:  Means of arrival: Ambulance Comments: 90 flank pain and hyperglycemia to ROOM 24

## 2016-04-17 NOTE — ED Notes (Signed)
[facility] called for transport.  

## 2016-04-17 NOTE — ED Triage Notes (Signed)
Patient from home two days ago he started with right flank pain that is intermittent.  EMS gave him 233mcg of Fentanyl with no pain improvement.  CBG-320.  Patient is a diabetic but has taken no meds in about three years.  No pain with urination.  Patient rates pain as a 4-5.

## 2016-04-17 NOTE — ED Provider Notes (Signed)
[facility] DEPT Provider Note   CSN: [id number] Arrival date & time: 04/17/16  1239     History   Chief Complaint No chief complaint on file.   HPI Randy [name] is a 83 y.o. male.  The history is provided by the patient. No language interpreter was used.  Abdominal Pain   This is a new problem. The current episode started more than 2 days ago. The problem occurs constantly. The problem has been gradually worsening. The pain is associated with an unknown factor. The pain is located in the RLQ. The pain is moderate. Pertinent negatives include vomiting. Nothing aggravates the symptoms. Nothing relieves the symptoms. Past workup does not include GI consult. His past medical history does not include PUD.  Pt reports left flank pain on and off for the past 2 days.  Pt reports pain is progressively worsening. Pain comes in sharp stabbing waves.  Past Medical History:  Diagnosis Date  . Anxiety   . Arthritis    R knee,   . CKD (chronic kidney disease)   . Coronary artery disease   . Diabetes mellitus without complication ([place])   . Dysrhythmia    PAF;,sss s/p St. Jude PPM  . Hyperlipidemia   . Hypertension   . MI (myocardial infarction) 2014  . Presence of permanent cardiac pacemaker     Patient Active Problem List   Diagnosis Date Noted  . 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 History of infection due to ESBL Escherichia coli   . Chronic kidney disease (CKD), stage III (moderate) 04/20/2014  . 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 Septic joint of right knee joint ([place]) 04/19/2014  . Pyelonephritis 03/11/2014  . Sepsis ([place]) 01/04/2014  . Diabetes mellitus without complication ([place])   . Diabetes mellitus ([place]) 12/21/2013  . Acute UTI 12/20/2013  . History of MI (myocardial infarction) 12/20/2013  . UTI  (lower urinary tract infection) 12/20/2013  . Elevated brain natriuretic peptide (BNP) level 12/20/2013  . Hyperglycemia 12/20/2013  . Acute kidney injury ([place]) 12/20/2013  . Decubitus ulcer 12/20/2013  . History of DVT (deep vein thrombosis) 12/20/2013  . Hyperlipidemia   . Hypertension   . Anxiety   . Essential hypertension   . Weakness generalized     Past Surgical History:  Procedure Laterality Date  . CHOLECYSTECTOMY    . CORONARY ANGIOPLASTY     mLAD '13, RCA DES 03/2013  . EXCISIONAL TOTAL KNEE ARTHROPLASTY WITH ANTIBIOTIC SPACERS Right 04/20/2014   Procedure: IRRIGATION AND DEBRIDMENT RIGHT TOTAL KNEE REMOVE  ACL IMPLANTED AND PLACE SPACER;  Surgeon: [name], MD;  Location: [facility];  Service: Orthopedics;  Laterality: Right;  . HEMORROIDECTOMY    . KNEE SURGERY  2006  . PACEMAKER INSERTION    . PICC LINE PLACE PERIPHERAL ([place] HX)  04/2014   R upper arm   . TOTAL KNEE ARTHROPLASTY WITH REVISION COMPONENTS Right 06/11/2014   Procedure: TOTAL KNEE ARTHROPLASTY WITH REVISION COMPONENTS REMOVE SPACER PLACE TKA;  Surgeon: [name], MD;  Location: [facility];  Service: Orthopedics;  Laterality: Right;       Home Medications    Prior to Admission medications   Medication Sig Start Date End Date Taking? Authorizing Provider  antiseptic oral rinse (CPC / CETYLPYRIDINIUM CHLORIDE 0.05%) 0.05 % LIQD solution 7 mLs by Mouth Rinse route 2 (two) times daily.  Patient not taking: Reported on 05/29/2014 12/22/13   Robbie Lis, MD  atorvastatin (LIPITOR) 10 MG tablet Take 10 mg by mouth at bedtime.     Historical Provider, MD  busPIRone (BUSPAR) 15 MG tablet Take 1 tablet (15 mg total) by mouth 2 (two) times daily. 01/08/14   Shanker Kristeen Mans, MD  carvedilol (COREG) 3.125 MG tablet Take 3.125 mg by mouth 2 (two) times daily with a meal.    Historical Provider, MD  clopidogrel (PLAVIX) 75 MG tablet Take 75 mg by mouth daily.    Historical Provider, MD  Diaper Rash Products (A+D DIAPER RASH  EX) Apply 1 application topically every 2 (two) hours as needed (diaper rash).    Historical Provider, MD  feeding supplement, ENSURE COMPLETE, (ENSURE COMPLETE) LIQD Take 237 mLs by mouth 2 (two) times daily between meals. Patient taking differently: Take 237 mLs by mouth 2 (two) times daily as needed (nutritional supplement).  12/22/13   Robbie Lis, MD  Hydroactive Dressings (EXUDERM LP 4"X4") PADS Apply 1 each topically once a week.    Historical Provider, MD  insulin NPH Human (HUMULIN N,NOVOLIN N) 100 UNIT/ML injection Inject 14 Units into the skin 2 (two) times daily before a meal. With breakfast and dinner    Historical Provider, MD  miconazole (MICOTIN) 2 % powder Apply 1 application topically daily as needed for itching.    Historical Provider, MD  Naproxen Sodium 220 MG CAPS Take 440 capsules by mouth 3 (three) times daily as needed (knee pain).    Historical Provider, MD  oxyCODONE-acetaminophen (ROXICET) 5-325 MG per tablet Take 1 tablet by mouth every 4 (four) hours as needed. 06/11/14   Leighton Parody, PA-C  ranitidine (ZANTAC) 150 MG capsule Take 150 mg by mouth 2 (two) times daily.    Historical Provider, MD  Skin Protectants, Misc. (EUCERIN) cream Apply 1 application topically as needed for dry skin.    Historical Provider, MD  sulfamethoxazole-trimethoprim (BACTRIM DS,SEPTRA DS) 800-160 MG per tablet Take 1 tablet by mouth daily. 06/15/14   Leighton Parody, PA-C  tamsulosin (FLOMAX) 0.4 MG CAPS capsule Take 1 capsule (0.4 mg total) by mouth daily. 12/22/13   Robbie Lis, MD  tizanidine (ZANAFLEX) 2 MG capsule Take 1 capsule (2 mg total) by mouth 3 (three) times daily. 06/11/14   Leighton Parody, PA-C  warfarin (COUMADIN) 5 MG tablet Take 5 mg by mouth daily.     Historical Provider, MD    Family History Family History  Problem Relation Age of Onset  . Heart attack Mother   . Heart attack Father     Social History Social History  Substance Use Topics  . Smoking status:  Former Smoker    Quit date: 06/07/1983  . Smokeless tobacco: Never Used  . Alcohol use No     Allergies   Patient has no known allergies.   Review of Systems Review of Systems  Gastrointestinal: Positive for abdominal pain. Negative for vomiting.  All other systems reviewed and are negative.    Physical Exam Updated Vital Signs BP (!) 150/107 (BP Location: Left Arm)   Pulse 70   Temp 98.6 F (37 C)   Resp 20   Ht 5\' 11"  (1.803 m)   Wt 95.3 kg   SpO2 95%   BMI 29.29 kg/m   Physical Exam  Constitutional: He appears well-developed and well-nourished.  HENT:  Head: Normocephalic and atraumatic.  Eyes: Conjunctivae are normal.  Neck: Neck supple.  Cardiovascular: Normal rate and regular rhythm.   No murmur heard. Pulmonary/Chest: Effort normal and breath sounds normal. No respiratory distress.  Abdominal: Soft. He exhibits no distension and no mass. There is no tenderness. There is no guarding. No hernia.  Musculoskeletal: He exhibits no edema.  Neurological: He is alert.  Skin: Skin is warm and dry.  Psychiatric: He has a normal mood and affect.  Nursing note and vitals reviewed.    ED Treatments / Results  Labs (all labs ordered are listed, but only abnormal results are displayed) Labs Reviewed  URINALYSIS, ROUTINE W REFLEX MICROSCOPIC - Abnormal; Notable for the following:       Result Value   APPearance HAZY (*)    Glucose, UA >=500 (*)    Leukocytes, UA MODERATE (*)    Bacteria, UA RARE (*)    Squamous Epithelial / LPF 0-5 (*)    Non Squamous Epithelial 6-30 (*)    All other components within normal limits  BASIC METABOLIC PANEL - Abnormal; Notable for the following:    Glucose, Bld 306 (*)    BUN 22 (*)    Creatinine, Ser 1.34 (*)    GFR calc non Af Amer 45 (*)    GFR calc Af Amer 52 (*)    All other components within normal limits  CBC    EKG  EKG Interpretation None       Radiology No results found.  Procedures Procedures (including  critical care time)  Medications Ordered in ED Medications  morphine 4 MG/ML injection 4 mg (4 mg Intravenous Given 04/17/16 1414)     Initial Impression / Assessment and Plan / ED Course  I have reviewed the triage vital signs and the nursing notes.  Pertinent labs & imaging results that were available during my care of the patient were reviewed by me and considered in my medical decision making (see chart for details).     Ct shows cyst.  Pt had same on ultrasound in 2015.  Ct scan shows constipation. I will have Rn give pt an enema and start him on miralax.   Final Clinical Impressions(s) / ED Diagnoses   Final diagnoses:  Constipation, unspecified constipation type    New Prescriptions New Prescriptions   POLYETHYLENE GLYCOL (MIRALAX) PACKET    Take 17 g by mouth daily.  An After Visit Summary was printed and given to the patient.   Hollace Kinnier Veneta, PA-C 04/17/16 [place], MD 04/17/16 1520

## 2016-06-17 ENCOUNTER — Inpatient Hospital Stay (HOSPITAL_COMMUNITY)
Admission: EM | Admit: 2016-06-17 | Discharge: 2016-07-06 | DRG: 559 | Disposition: A | Discharge status: Skilled Nursing Facility | Payer: Medicare Other | Attending: Internal Medicine | Admitting: Internal Medicine

## 2016-06-17 ENCOUNTER — Inpatient Hospital Stay (HOSPITAL_COMMUNITY): Payer: Medicare Other

## 2016-06-17 ENCOUNTER — Emergency Department (HOSPITAL_COMMUNITY): Payer: Medicare Other

## 2016-06-17 DIAGNOSIS — D638 Anemia in other chronic diseases classified elsewhere: Secondary | ICD-10-CM | POA: Diagnosis not present

## 2016-06-17 DIAGNOSIS — I350 Nonrheumatic aortic (valve) stenosis: Secondary | ICD-10-CM | POA: Diagnosis present

## 2016-06-17 DIAGNOSIS — Z87891 Personal history of nicotine dependence: Secondary | ICD-10-CM

## 2016-06-17 DIAGNOSIS — E119 Type 2 diabetes mellitus without complications: Secondary | ICD-10-CM

## 2016-06-17 DIAGNOSIS — E1165 Type 2 diabetes mellitus with hyperglycemia: Secondary | ICD-10-CM | POA: Diagnosis present

## 2016-06-17 DIAGNOSIS — Z9049 Acquired absence of other specified parts of digestive tract: Secondary | ICD-10-CM | POA: Diagnosis not present

## 2016-06-17 DIAGNOSIS — D72829 Elevated white blood cell count, unspecified: Secondary | ICD-10-CM

## 2016-06-17 DIAGNOSIS — N183 Chronic kidney disease, stage 3 unspecified: Secondary | ICD-10-CM | POA: Diagnosis present

## 2016-06-17 DIAGNOSIS — T827XXA Infection and inflammatory reaction due to other cardiac and vascular devices, implants and grafts, initial encounter: Secondary | ICD-10-CM | POA: Diagnosis not present

## 2016-06-17 DIAGNOSIS — A4101 Sepsis due to Methicillin susceptible Staphylococcus aureus: Secondary | ICD-10-CM | POA: Diagnosis present

## 2016-06-17 DIAGNOSIS — B952 Enterococcus as the cause of diseases classified elsewhere: Secondary | ICD-10-CM | POA: Diagnosis not present

## 2016-06-17 DIAGNOSIS — A419 Sepsis, unspecified organism: Secondary | ICD-10-CM

## 2016-06-17 DIAGNOSIS — M00061 Staphylococcal arthritis, right knee: Secondary | ICD-10-CM | POA: Diagnosis not present

## 2016-06-17 DIAGNOSIS — I82431 Acute embolism and thrombosis of right popliteal vein: Secondary | ICD-10-CM | POA: Diagnosis present

## 2016-06-17 DIAGNOSIS — K921 Melena: Secondary | ICD-10-CM | POA: Diagnosis not present

## 2016-06-17 DIAGNOSIS — I10 Essential (primary) hypertension: Secondary | ICD-10-CM | POA: Diagnosis not present

## 2016-06-17 DIAGNOSIS — I480 Paroxysmal atrial fibrillation: Secondary | ICD-10-CM | POA: Diagnosis present

## 2016-06-17 DIAGNOSIS — R059 Cough, unspecified: Secondary | ICD-10-CM

## 2016-06-17 DIAGNOSIS — B356 Tinea cruris: Secondary | ICD-10-CM | POA: Diagnosis present

## 2016-06-17 DIAGNOSIS — E785 Hyperlipidemia, unspecified: Secondary | ICD-10-CM | POA: Diagnosis present

## 2016-06-17 DIAGNOSIS — N179 Acute kidney failure, unspecified: Secondary | ICD-10-CM | POA: Diagnosis not present

## 2016-06-17 DIAGNOSIS — L89152 Pressure ulcer of sacral region, stage 2: Secondary | ICD-10-CM

## 2016-06-17 DIAGNOSIS — I2510 Atherosclerotic heart disease of native coronary artery without angina pectoris: Secondary | ICD-10-CM | POA: Diagnosis present

## 2016-06-17 DIAGNOSIS — Z7901 Long term (current) use of anticoagulants: Secondary | ICD-10-CM | POA: Diagnosis not present

## 2016-06-17 DIAGNOSIS — Z955 Presence of coronary angioplasty implant and graft: Secondary | ICD-10-CM

## 2016-06-17 DIAGNOSIS — T8454XA Infection and inflammatory reaction due to internal left knee prosthesis, initial encounter: Secondary | ICD-10-CM | POA: Diagnosis not present

## 2016-06-17 DIAGNOSIS — R195 Other fecal abnormalities: Secondary | ICD-10-CM | POA: Diagnosis not present

## 2016-06-17 DIAGNOSIS — F0280 Dementia in other diseases classified elsewhere without behavioral disturbance: Secondary | ICD-10-CM | POA: Diagnosis not present

## 2016-06-17 DIAGNOSIS — Y792 Prosthetic and other implants, materials and accessory orthopedic devices associated with adverse incidents: Secondary | ICD-10-CM | POA: Diagnosis not present

## 2016-06-17 DIAGNOSIS — F0390 Unspecified dementia without behavioral disturbance: Secondary | ICD-10-CM | POA: Diagnosis not present

## 2016-06-17 DIAGNOSIS — T8453XA Infection and inflammatory reaction due to internal right knee prosthesis, initial encounter: Principal | ICD-10-CM | POA: Diagnosis present

## 2016-06-17 DIAGNOSIS — I82403 Acute embolism and thrombosis of unspecified deep veins of lower extremity, bilateral: Secondary | ICD-10-CM

## 2016-06-17 DIAGNOSIS — L309 Dermatitis, unspecified: Secondary | ICD-10-CM | POA: Diagnosis not present

## 2016-06-17 DIAGNOSIS — N170 Acute kidney failure with tubular necrosis: Secondary | ICD-10-CM | POA: Diagnosis not present

## 2016-06-17 DIAGNOSIS — L8990 Pressure ulcer of unspecified site, unspecified stage: Secondary | ICD-10-CM | POA: Insufficient documentation

## 2016-06-17 DIAGNOSIS — Z950 Presence of cardiac pacemaker: Secondary | ICD-10-CM

## 2016-06-17 DIAGNOSIS — R7881 Bacteremia: Secondary | ICD-10-CM

## 2016-06-17 DIAGNOSIS — I482 Chronic atrial fibrillation: Secondary | ICD-10-CM | POA: Diagnosis present

## 2016-06-17 DIAGNOSIS — E1122 Type 2 diabetes mellitus with diabetic chronic kidney disease: Secondary | ICD-10-CM

## 2016-06-17 DIAGNOSIS — Y831 Surgical operation with implant of artificial internal device as the cause of abnormal reaction of the patient, or of later complication, without mention of misadventure at the time of the procedure: Secondary | ICD-10-CM | POA: Diagnosis present

## 2016-06-17 DIAGNOSIS — I82409 Acute embolism and thrombosis of unspecified deep veins of unspecified lower extremity: Secondary | ICD-10-CM | POA: Diagnosis not present

## 2016-06-17 DIAGNOSIS — R05 Cough: Secondary | ICD-10-CM

## 2016-06-17 DIAGNOSIS — G301 Alzheimer's disease with late onset: Secondary | ICD-10-CM | POA: Diagnosis not present

## 2016-06-17 DIAGNOSIS — T827XXD Infection and inflammatory reaction due to other cardiac and vascular devices, implants and grafts, subsequent encounter: Secondary | ICD-10-CM | POA: Diagnosis not present

## 2016-06-17 DIAGNOSIS — L304 Erythema intertrigo: Secondary | ICD-10-CM | POA: Diagnosis not present

## 2016-06-17 DIAGNOSIS — D62 Acute posthemorrhagic anemia: Secondary | ICD-10-CM | POA: Diagnosis not present

## 2016-06-17 DIAGNOSIS — Z8249 Family history of ischemic heart disease and other diseases of the circulatory system: Secondary | ICD-10-CM

## 2016-06-17 DIAGNOSIS — E876 Hypokalemia: Secondary | ICD-10-CM | POA: Diagnosis present

## 2016-06-17 DIAGNOSIS — M00062 Staphylococcal arthritis, left knee: Secondary | ICD-10-CM | POA: Diagnosis not present

## 2016-06-17 DIAGNOSIS — I252 Old myocardial infarction: Secondary | ICD-10-CM | POA: Diagnosis not present

## 2016-06-17 DIAGNOSIS — E872 Acidosis: Secondary | ICD-10-CM | POA: Diagnosis present

## 2016-06-17 DIAGNOSIS — I82411 Acute embolism and thrombosis of right femoral vein: Secondary | ICD-10-CM | POA: Diagnosis present

## 2016-06-17 DIAGNOSIS — R4182 Altered mental status, unspecified: Secondary | ICD-10-CM | POA: Diagnosis present

## 2016-06-17 DIAGNOSIS — R4781 Slurred speech: Secondary | ICD-10-CM | POA: Diagnosis present

## 2016-06-17 DIAGNOSIS — B9561 Methicillin susceptible Staphylococcus aureus infection as the cause of diseases classified elsewhere: Secondary | ICD-10-CM | POA: Diagnosis not present

## 2016-06-17 DIAGNOSIS — I129 Hypertensive chronic kidney disease with stage 1 through stage 4 chronic kidney disease, or unspecified chronic kidney disease: Secondary | ICD-10-CM | POA: Diagnosis present

## 2016-06-17 DIAGNOSIS — B372 Candidiasis of skin and nail: Secondary | ICD-10-CM | POA: Diagnosis present

## 2016-06-17 DIAGNOSIS — Z96651 Presence of right artificial knee joint: Secondary | ICD-10-CM | POA: Diagnosis present

## 2016-06-17 DIAGNOSIS — R609 Edema, unspecified: Secondary | ICD-10-CM | POA: Diagnosis not present

## 2016-06-17 DIAGNOSIS — N189 Chronic kidney disease, unspecified: Secondary | ICD-10-CM

## 2016-06-17 DIAGNOSIS — R54 Age-related physical debility: Secondary | ICD-10-CM | POA: Diagnosis present

## 2016-06-17 DIAGNOSIS — Y712 Prosthetic and other implants, materials and accessory cardiovascular devices associated with adverse incidents: Secondary | ICD-10-CM | POA: Diagnosis not present

## 2016-06-17 LAB — CBC WITH DIFFERENTIAL/PLATELET
BASOS ABS: 0.1 10*3/uL (ref 0.0–0.1)
Basophils Relative: 0 %
EOS PCT: 0 %
Eosinophils Absolute: 0 10*3/uL (ref 0.0–0.7)
HEMATOCRIT: 42.6 % (ref 39.0–52.0)
Hemoglobin: 13.9 g/dL (ref 13.0–17.0)
LYMPHS PCT: 9 %
Lymphs Abs: 1.8 10*3/uL (ref 0.7–4.0)
MCH: 28.3 pg (ref 26.0–34.0)
MCHC: 32.6 g/dL (ref 30.0–36.0)
MCV: 86.8 fL (ref 78.0–100.0)
Monocytes Absolute: 2.5 10*3/uL — ABNORMAL HIGH (ref 0.1–1.0)
Monocytes Relative: 12 %
NEUTROS ABS: 15.7 10*3/uL — AB (ref 1.7–7.7)
Neutrophils Relative %: 79 %
PLATELETS: 273 10*3/uL (ref 150–400)
RBC: 4.91 MIL/uL (ref 4.22–5.81)
RDW: 15.1 % (ref 11.5–15.5)
WBC: 20.1 10*3/uL — AB (ref 4.0–10.5)

## 2016-06-17 LAB — COMPREHENSIVE METABOLIC PANEL
ALT: 8 U/L — ABNORMAL LOW (ref 17–63)
ANION GAP: 11 (ref 5–15)
AST: 21 U/L (ref 15–41)
Albumin: 3.3 g/dL — ABNORMAL LOW (ref 3.5–5.0)
Alkaline Phosphatase: 70 U/L (ref 38–126)
BUN: 21 mg/dL — ABNORMAL HIGH (ref 6–20)
CO2: 22 mmol/L (ref 22–32)
Calcium: 8.7 mg/dL — ABNORMAL LOW (ref 8.9–10.3)
Chloride: 104 mmol/L (ref 101–111)
Creatinine, Ser: 1.87 mg/dL — ABNORMAL HIGH (ref 0.61–1.24)
GFR, EST AFRICAN AMERICAN: 35 mL/min — AB (ref >60)
GFR, EST NON AFRICAN AMERICAN: 30 mL/min — AB (ref >60)
Glucose, Bld: 411 mg/dL — ABNORMAL HIGH (ref 65–99)
POTASSIUM: 4.5 mmol/L (ref 3.5–5.1)
Sodium: 137 mmol/L (ref 135–145)
TOTAL PROTEIN: 7.1 g/dL (ref 6.5–8.1)
Total Bilirubin: 0.6 mg/dL (ref 0.3–1.2)

## 2016-06-17 LAB — CBG MONITORING, ED: GLUCOSE-CAPILLARY: 368 mg/dL — AB (ref 65–99)

## 2016-06-17 LAB — URINALYSIS, ROUTINE W REFLEX MICROSCOPIC
BILIRUBIN URINE: NEGATIVE
Bacteria, UA: NONE SEEN
Glucose, UA: >=500 mg/dL — AB
KETONES UR: 5 mg/dL — AB
LEUKOCYTES UA: NEGATIVE
Nitrite: NEGATIVE
PROTEIN: 30 mg/dL — AB
SQUAMOUS EPITHELIAL / LPF: NONE SEEN
Specific Gravity, Urine: 1.02 (ref 1.005–1.030)
pH: 5 (ref 5.0–8.0)

## 2016-06-17 LAB — I-STAT CG4 LACTIC ACID, ED
LACTIC ACID, VENOUS: 4.42 mmol/L — AB (ref 0.5–1.9)
Lactic Acid, Venous: 2.95 mmol/L (ref 0.5–1.9)

## 2016-06-17 LAB — INFLUENZA PANEL BY PCR (TYPE A & B)
INFLBPCR: NEGATIVE
Influenza A By PCR: NEGATIVE

## 2016-06-17 MED ORDER — VANCOMYCIN HCL IN DEXTROSE 1-5 GM/200ML-% IV SOLN: 1000.0000 mg | INTRAVENOUS | Status: DC

## 2016-06-17 MED ORDER — INSULIN DETEMIR 100 UNIT/ML ~~LOC~~ SOLN
5.0000 [IU] | Freq: Two times a day (BID) | SUBCUTANEOUS | Status: DC
  Administered 2016-06-18 – 2016-06-24 (×14): 5 [IU] via SUBCUTANEOUS
  Filled 2016-06-17 (×17): qty 0.05

## 2016-06-17 MED ORDER — SODIUM CHLORIDE 0.9 % IV SOLN
INTRAVENOUS | Status: DC
  Administered 2016-06-18 – 2016-06-20 (×5): via INTRAVENOUS
  Administered 2016-06-20 – 2016-06-21 (×2): 1000 mL via INTRAVENOUS
  Administered 2016-06-23 – 2016-06-28 (×3): via INTRAVENOUS

## 2016-06-17 MED ORDER — SODIUM CHLORIDE 0.9 % IV BOLUS (SEPSIS)
1000.0000 mL | Freq: Once | INTRAVENOUS | Status: AC
  Administered 2016-06-17: 1000 mL via INTRAVENOUS

## 2016-06-17 MED ORDER — PIPERACILLIN-TAZOBACTAM 3.375 G IVPB
3.3750 g | Freq: Three times a day (TID) | INTRAVENOUS | Status: DC
  Administered 2016-06-18 (×2): 3.375 g via INTRAVENOUS
  Filled 2016-06-17 (×2): qty 50

## 2016-06-17 MED ORDER — INSULIN ASPART 100 UNIT/ML ~~LOC~~ SOLN
0.0000 [IU] | Freq: Three times a day (TID) | SUBCUTANEOUS | Status: DC
  Administered 2016-06-18: 3 [IU] via SUBCUTANEOUS
  Administered 2016-06-18: 4 [IU] via SUBCUTANEOUS
  Administered 2016-06-18: 11 [IU] via SUBCUTANEOUS
  Administered 2016-06-19: 4 [IU] via SUBCUTANEOUS
  Administered 2016-06-19: 7 [IU] via SUBCUTANEOUS
  Administered 2016-06-19: 4 [IU] via SUBCUTANEOUS
  Administered 2016-06-20: 3 [IU] via SUBCUTANEOUS
  Administered 2016-06-20: 4 [IU] via SUBCUTANEOUS
  Administered 2016-06-21: 3 [IU] via SUBCUTANEOUS
  Administered 2016-06-21: 7 [IU] via SUBCUTANEOUS
  Administered 2016-06-21: 4 [IU] via SUBCUTANEOUS
  Administered 2016-06-22: 11 [IU] via SUBCUTANEOUS
  Administered 2016-06-22: 3 [IU] via SUBCUTANEOUS
  Administered 2016-06-23 (×2): 11 [IU] via SUBCUTANEOUS
  Administered 2016-06-24 (×2): 4 [IU] via SUBCUTANEOUS
  Administered 2016-06-24: 11 [IU] via SUBCUTANEOUS
  Administered 2016-06-25: 7 [IU] via SUBCUTANEOUS
  Administered 2016-06-25: 3 [IU] via SUBCUTANEOUS
  Administered 2016-06-25: 4 [IU] via SUBCUTANEOUS
  Administered 2016-06-26: 3 [IU] via SUBCUTANEOUS
  Administered 2016-06-26: 4 [IU] via SUBCUTANEOUS
  Administered 2016-06-26 – 2016-06-27 (×2): 7 [IU] via SUBCUTANEOUS
  Administered 2016-06-27 – 2016-06-28 (×3): 4 [IU] via SUBCUTANEOUS
  Administered 2016-06-28: 3 [IU] via SUBCUTANEOUS
  Administered 2016-06-28: 7 [IU] via SUBCUTANEOUS
  Administered 2016-06-29: 4 [IU] via SUBCUTANEOUS
  Administered 2016-06-29: 7 [IU] via SUBCUTANEOUS
  Administered 2016-06-29: 3 [IU] via SUBCUTANEOUS
  Administered 2016-06-30: 7 [IU] via SUBCUTANEOUS
  Administered 2016-06-30: 4 [IU] via SUBCUTANEOUS
  Administered 2016-07-01 (×2): 3 [IU] via SUBCUTANEOUS
  Administered 2016-07-01: 4 [IU] via SUBCUTANEOUS
  Administered 2016-07-02: 11 [IU] via SUBCUTANEOUS
  Administered 2016-07-02: 3 [IU] via SUBCUTANEOUS
  Administered 2016-07-03: 11 [IU] via SUBCUTANEOUS
  Administered 2016-07-03: 4 [IU] via SUBCUTANEOUS
  Administered 2016-07-04: 3 [IU] via SUBCUTANEOUS
  Administered 2016-07-04: 7 [IU] via SUBCUTANEOUS
  Administered 2016-07-05 – 2016-07-06 (×4): 3 [IU] via SUBCUTANEOUS

## 2016-06-17 MED ORDER — SODIUM CHLORIDE 0.9 % IV BOLUS (SEPSIS)
500.0000 mL | Freq: Once | INTRAVENOUS | Status: AC
  Administered 2016-06-17: 500 mL via INTRAVENOUS

## 2016-06-17 MED ORDER — VANCOMYCIN HCL IN DEXTROSE 1-5 GM/200ML-% IV SOLN
1000.0000 mg | Freq: Once | INTRAVENOUS | Status: AC
  Administered 2016-06-17: 1000 mg via INTRAVENOUS
  Filled 2016-06-17: qty 200

## 2016-06-17 MED ORDER — ACETAMINOPHEN 325 MG PO TABS
650.0000 mg | ORAL_TABLET | Freq: Once | ORAL | Status: AC
  Administered 2016-06-17: 650 mg via ORAL
  Filled 2016-06-17: qty 2

## 2016-06-17 MED ORDER — NYSTATIN 100000 UNIT/GM EX POWD
Freq: Three times a day (TID) | CUTANEOUS | Status: DC
  Administered 2016-06-18: 22:00:00 via TOPICAL
  Administered 2016-06-18: 1 via TOPICAL
  Administered 2016-06-18 – 2016-06-25 (×21): via TOPICAL
  Filled 2016-06-17: qty 15

## 2016-06-17 MED ORDER — PIPERACILLIN-TAZOBACTAM 3.375 G IVPB 30 MIN
3.3750 g | Freq: Once | INTRAVENOUS | Status: AC
  Administered 2016-06-17: 3.375 g via INTRAVENOUS
  Filled 2016-06-17: qty 50

## 2016-06-17 MED ORDER — INSULIN ASPART 100 UNIT/ML ~~LOC~~ SOLN
0.0000 [IU] | Freq: Every day | SUBCUTANEOUS | Status: DC
  Administered 2016-06-18: 5 [IU] via SUBCUTANEOUS
  Administered 2016-06-18 – 2016-06-27 (×2): 2 [IU] via SUBCUTANEOUS

## 2016-06-17 MED ORDER — VANCOMYCIN HCL IN DEXTROSE 750-5 MG/150ML-% IV SOLN: 750.0000 mg | INTRAVENOUS | Status: DC

## 2016-06-17 MED ORDER — ENOXAPARIN SODIUM 30 MG/0.3ML ~~LOC~~ SOLN
30.0000 mg | SUBCUTANEOUS | Status: DC
  Administered 2016-06-18 – 2016-06-24 (×7): 30 mg via SUBCUTANEOUS
  Filled 2016-06-17 (×7): qty 0.3

## 2016-06-17 NOTE — Progress Notes (Signed)
Pharmacy Antibiotic Note  Randy [name] is a 83 y.o. male presented to the ED on 06/17/2016 with c/o AMS and cough for the past couple pf days.  To start broad abx with vancomycin and zosyn for suspected sepsis.  - Tmax 101.9, wbc 20.1, scr 1.87 (crcl~28), LA 4.42   Plan: - Zosyn 3.375 gm IV x1 over 30 min, then 3.375 gm IV q8h (infuse over 4 hours) - Vancomycin 1000 mg IV q24h - daily scr  _________________________  Height: 5\' 11"  (180.3 cm) Weight: 180 lb (81.6 kg) IBW/kg (Calculated) : 75.3  Temp (24hrs), Avg:100 F (37.8 C), Min:98.1 F (36.7 C), Max:101.9 F (38.8 C)   Recent Labs Lab 06/17/16 2012 06/17/16 2028  WBC 20.1*  --   LATICACIDVEN  --  4.42*    CrCl cannot be calculated (Patient's most recent lab result is older than the maximum 21 days allowed.).    No Known Allergies    Thank you for allowing pharmacy to be a part of this patient's care.  Lynelle Doctor 06/17/2016 8:39 PM

## 2016-06-17 NOTE — ED Notes (Signed)
Pt being transported via to CT.

## 2016-06-17 NOTE — ED Provider Notes (Signed)
Emergency Department Provider Note   I have reviewed the triage vital signs and the nursing notes.   HISTORY  Chief Complaint Altered Mental Status; Hyperglycemia; and Fever   HPI Randy [name] is a 83 y.o. male with PMH of CKD, HLD, DM, HLT, CAD resents to the emergency department for evaluation of worsening confusion and cough. The patient is very confused which limits my HPI and ROS. No family or health aides at bedside to assist with history. EMS notes elevated BS en route.   Level 5 caveat: Confusion   Past Medical History:  Diagnosis Date  . Anxiety   . Arthritis    R knee,   . CKD (chronic kidney disease)   . Coronary artery disease   . Diabetes mellitus without complication ([place])   . Dysrhythmia    PAF;,sss s/p St. Jude PPM  . Hyperlipidemia   . Hypertension   . MI (myocardial infarction) 2014  . Presence of permanent cardiac pacemaker     Patient Active Problem List   Diagnosis Date Noted  . Cough 06/17/2016  . Leukocytosis 06/17/2016  . Pacemaker 06/17/2016  . 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 History of infection due to ESBL Escherichia coli   . Chronic kidney disease (CKD), stage III (moderate) 04/20/2014  . Infection of total right knee replacement ([place]) 04/20/2014  . Septic joint of right knee joint ([place]) 04/19/2014  . Pyelonephritis 03/11/2014  . Sepsis ([place]) 01/04/2014  . Diabetes mellitus without complication ([place])   . Diabetes mellitus ([place]) 12/21/2013  . Acute UTI 12/20/2013  . History of MI (myocardial infarction) 12/20/2013  . UTI (lower urinary tract infection) 12/20/2013  . Elevated brain natriuretic peptide (BNP) level 12/20/2013  . Hyperglycemia 12/20/2013  . Acute kidney injury ([place]) 12/20/2013  . Decubitus ulcer 12/20/2013  . History of DVT (deep vein thrombosis) 12/20/2013  .  Hyperlipidemia   . Hypertension   . Anxiety   . Essential hypertension   . Weakness generalized     Past Surgical History:  Procedure Laterality Date  . CHOLECYSTECTOMY    . CORONARY ANGIOPLASTY     mLAD '13, RCA DES 03/2013  . EXCISIONAL TOTAL KNEE ARTHROPLASTY WITH ANTIBIOTIC SPACERS Right 04/20/2014   Procedure: IRRIGATION AND DEBRIDMENT RIGHT TOTAL KNEE REMOVE  ACL IMPLANTED AND PLACE SPACER;  Surgeon: [name], MD;  Location: [facility];  Service: Orthopedics;  Laterality: Right;  . HEMORROIDECTOMY    . KNEE SURGERY  2006  . PACEMAKER INSERTION    . PICC LINE PLACE PERIPHERAL ([place] HX)  04/2014   R upper arm   . TOTAL KNEE ARTHROPLASTY WITH REVISION COMPONENTS Right 06/11/2014   Procedure: TOTAL KNEE ARTHROPLASTY WITH REVISION COMPONENTS REMOVE SPACER PLACE TKA;  Surgeon: [name], MD;  Location: [facility];  Service: Orthopedics;  Laterality: Right;      Allergies Patient has no known allergies.  Family History  Problem Relation Age of Onset  . Heart attack Mother   . Heart attack Father     Social History Social History  Substance Use Topics  . Smoking status: Former Smoker    Quit date: 06/07/1983  . Smokeless tobacco: Never Used  . Alcohol use No    Review of Systems  Level 5 caveat: Confusion.   ____________________________________________   PHYSICAL EXAM:  VITAL SIGNS: ED Triage Vitals  Enc Vitals Group  BP 06/17/16 1904 115/62     Pulse Rate 06/17/16 1904 85     Resp 06/17/16 1904 18     Temp 06/17/16 1904 98.1 F (36.7 C)     Temp Source 06/17/16 1904 Oral     SpO2 06/17/16 1904 93 %     Weight 06/17/16 1931 180 lb (81.6 kg)     Height 06/17/16 1931 5\' 11"  (1.803 m)     Pain Score 06/17/16 1859 0   Constitutional: Alert but confused. Appears acutely ill but no acute distress.  Eyes: Conjunctivae are normal.  Head: Atraumatic. Nose: No congestion/rhinnorhea. Mouth/Throat: Mucous membranes are very dry.  Neck: No stridor.  Cardiovascular:  Normal rate, regular rhythm. Good peripheral circulation. Grossly normal heart sounds.   Respiratory: Increased respiratory effort.  No retractions. Lungs with crackles at the bases bilaterally.  Gastrointestinal: Soft and nontender. No distention.  Musculoskeletal: No lower extremity tenderness nor edema. No gross deformities of extremities. Neurologic:  Normal speech and language. No gross focal neurologic deficits are appreciated.  Skin:  Skin is cool and dry. Sacral ulcer noted with no significant drainage or fluctuance.    ____________________________________________   LABS (all labs ordered are listed, but only abnormal results are displayed)  Labs Reviewed  COMPREHENSIVE METABOLIC PANEL - Abnormal; Notable for the following:       Result Value   Glucose, Bld 411 (*)    BUN 21 (*)    Creatinine, Ser 1.87 (*)    Calcium 8.7 (*)    Albumin 3.3 (*)    ALT 8 (*)    GFR calc non Af Amer 30 (*)    GFR calc Af Amer 35 (*)    All other components within normal limits  CBC WITH DIFFERENTIAL/PLATELET - Abnormal; Notable for the following:    WBC 20.1 (*)    Neutro Abs 15.7 (*)    Monocytes Absolute 2.5 (*)    All other components within normal limits  URINALYSIS, ROUTINE W REFLEX MICROSCOPIC - Abnormal; Notable for the following:    Glucose, UA >=500 (*)    Hgb urine dipstick SMALL (*)    Ketones, ur 5 (*)    Protein, ur 30 (*)    All other components within normal limits  I-STAT CG4 LACTIC ACID, ED - Abnormal; Notable for the following:    Lactic Acid, Venous 4.42 (*)    All other components within normal limits  CBG MONITORING, ED - Abnormal; Notable for the following:    Glucose-Capillary 368 (*)    All other components within normal limits  I-STAT CG4 LACTIC ACID, ED - Abnormal; Notable for the following:    Lactic Acid, Venous 2.95 (*)    All other components within normal limits  CULTURE, BLOOD (ROUTINE X 2)  CULTURE, BLOOD (ROUTINE X 2)  CULTURE, EXPECTORATED  SPUTUM-ASSESSMENT  GRAM STAIN  INFLUENZA PANEL BY PCR (TYPE A & B)  STREP PNEUMONIAE URINARY ANTIGEN  LACTIC ACID, PLASMA  PROCALCITONIN  PROTIME-INR  APTT  LEGIONELLA PNEUMOPHILA SEROGP 1 UR AG  CBC  BASIC METABOLIC PANEL   ____________________________________________  EKG   EKG Interpretation  Date/Time:  Wednesday Jun 17 2016 19:35:42 EDT Ventricular Rate:  77 PR Interval:    QRS Duration: 171 QT Interval:  433 QTC Calculation: 491 R Axis:   -66 Text Interpretation:  A-V dual-paced rhythm with some inhibition No further analysis attempted due to paced rhythm Similar to prior. No STEMI.  Confirmed by LONG MD, JOSHUA [phone]) on  06/17/2016 7:40:53 PM       ____________________________________________  RADIOLOGY  Dg Chest 2 View  Result Date: 06/17/2016 CLINICAL DATA:  Lethargy and cough EXAM: CHEST  2 VIEW COMPARISON:  04/23/2014 FINDINGS: Cardiac shadow is stable. Pacing device is again noted. The lungs are well aerated bilaterally. No bony abnormality is noted. IMPRESSION: No active cardiopulmonary disease. Electronically Signed   By: Inez Catalina M.D.   On: 06/17/2016 20:38    ____________________________________________   PROCEDURES  Procedure(s) performed:   Procedures  CRITICAL CARE Performed by: Margette Fast Total critical care time: 40 minutes Critical care time was exclusive of separately billable procedures and treating other patients. Critical care was necessary to treat or prevent imminent or life-threatening deterioration. Critical care was time spent personally by me on the following activities: development of treatment plan with patient and/or surrogate as well as nursing, discussions with consultants, evaluation of patient's response to treatment, examination of patient, obtaining history from patient or surrogate, ordering and performing treatments and interventions, ordering and review of laboratory studies, ordering and review of radiographic  studies, pulse oximetry and re-evaluation of patient's condition.  Nanda Quinton, MD Emergency Medicine  ____________________________________________   INITIAL IMPRESSION / ASSESSMENT AND PLAN / ED COURSE  Pertinent labs & imaging results that were available during my care of the patient were reviewed by me and considered in my medical decision making (see chart for details).  Patient resents emergency pertinent for evaluation of altered mental status and cough over the past 2 days. No family or health care aides at bedside for further history. We'll attempt to contact him by phone. Patient is febrile here. Have initiated sepsis order set but does not meet sepsis criteria at this time for Code Sepsis.  08:33 PM Patient with elevated lactate over 4. With fever I will activate a code sepsis. We will initiate 30 mL/kg bolus and antibiotics to cover unknown infection source. I was unable to reach any family or caregiver by phone.   Discussed patient's case with hospitalist, Dr. Eulas Post. Patient and family (if present) updated with plan. Care transferred to hospitalist service.  I reviewed all nursing notes, vitals, pertinent old records, EKGs, labs, imaging (as available).  11:55 PM Repeat lactate down-trending but remains elevated. Continue IVF. Patient admitted by hospitalist.   ____________________________________________  FINAL CLINICAL IMPRESSION(S) / ED DIAGNOSES  Final diagnoses:  Sepsis, due to unspecified organism ([place])     MEDICATIONS GIVEN DURING THIS VISIT:  Medications  piperacillin-tazobactam (ZOSYN) IVPB 3.375 g (not administered)  vancomycin (VANCOCIN) IVPB 1000 mg/200 mL premix (not administered)  nystatin (MYCOSTATIN/NYSTOP) topical powder (not administered)  0.9 %  sodium chloride infusion (not administered)  insulin detemir (LEVEMIR) injection 5 Units (not administered)  insulin aspart (novoLOG) injection 0-20 Units (not administered)  insulin aspart (novoLOG)  injection 0-5 Units (not administered)  enoxaparin (LOVENOX) injection 30 mg (not administered)  acetaminophen (TYLENOL) tablet 650 mg (650 mg Oral Given 06/17/16 1932)  sodium chloride 0.9 % bolus 1,000 mL (0 mLs Intravenous Stopped 06/17/16 2239)    And  sodium chloride 0.9 % bolus 1,000 mL (0 mLs Intravenous Stopped 06/17/16 2240)    And  sodium chloride 0.9 % bolus 500 mL (0 mLs Intravenous Stopped 06/17/16 2208)  piperacillin-tazobactam (ZOSYN) IVPB 3.375 g (0 g Intravenous Stopped 06/17/16 2152)  vancomycin (VANCOCIN) IVPB 1000 mg/200 mL premix (0 mg Intravenous Stopped 06/17/16 2334)     NEW OUTPATIENT MEDICATIONS STARTED DURING THIS VISIT:  None   Note:  This document was prepared using Dragon voice recognition software and may include unintentional dictation errors.  Nanda Quinton, MD Emergency Medicine   Margette Fast, MD 06/18/16 [phone]

## 2016-06-17 NOTE — ED Notes (Signed)
Attempted to draw blood from new IV and from previous IV with no success.

## 2016-06-17 NOTE — ED Notes (Signed)
Called ICU for 20 minute timer to transport.

## 2016-06-17 NOTE — H&P (Signed)
History and Physical    Randy [name] BTD:[id number] DOB: Aug 20, 1924 DOA: 06/17/2016  PCP: Pcp Not In System   Patient coming from: Home  Chief Complaint: Altered mental status, wet cough  HPI: Randy [name] is a 83 y.o. gentleman with a history of mild dementia/cognitive impairment, CAD with prior stent placement, history of atrial fibrillation S/P PPM implant (CHADS-Vasc score of at least four, no anticoagulation), HTN, HLD, DM, and CKD 3 with baseline creatinine around 1.3-1.5 who was referred to the ED via EMS by one of his caregivers for evaluation of increased lethargy, mental status changes, and cough.  The patient is nonambulatory at baseline and spends most of his day sitting up a recliner.  He has caregiver support 24 hours/day.  He has had cough for 4-5 days.  He has had two known sick contacts (son-in-law and caregiver).  He has had yellow sputum.  No chest pain or shortness of breath.  No nausea or vomiting.  No abdominal pain.  No dysuria.  No diarrhea.  Today, he was noted to have increased lethargy and muffled speech with slurring.  No other focal weakenss.  911 was called and the patient was transferred to the ED for evaluation.  No recent hospitalizations.  No recent antibiotics.  He has been using OTC mucinex for his symptoms.  No overt signs or symptoms of aspiration.  ED Course: Tmax 102.  RR 30.  Lactic acid level 4.42.  WBC count 20.  Glucose 411, bicarb 22.  Code Sepsis activated.  EKG is AV paced.  U/A does not appear infected.  Chest xray negative for acute process.  Flu screen pending.  S/P 2.5L NS.  He has received IV vanc and zosyn.  Hospitalist asked to admit.  Review of Systems: Caregiver mentions new groin rash.  Otherwise, 10 systems reviewed and negative except as stated in the HPI.   Past Medical History:  Diagnosis Date  . Anxiety   . Arthritis    R knee,   . CKD (chronic kidney disease)   . Coronary artery disease   . Diabetes mellitus without  complication ([place])   . Dysrhythmia    PAF;,sss s/p St. Jude PPM  . Hyperlipidemia   . Hypertension   . MI (myocardial infarction) 2014  . Presence of permanent cardiac pacemaker     Past Surgical History:  Procedure Laterality Date  . CHOLECYSTECTOMY    . CORONARY ANGIOPLASTY     mLAD '13, RCA DES 03/2013  . EXCISIONAL TOTAL KNEE ARTHROPLASTY WITH ANTIBIOTIC SPACERS Right 04/20/2014   Procedure: IRRIGATION AND DEBRIDMENT RIGHT TOTAL KNEE REMOVE  ACL IMPLANTED AND PLACE SPACER;  Surgeon: [name], MD;  Location: [facility];  Service: Orthopedics;  Laterality: Right;  . HEMORROIDECTOMY    . KNEE SURGERY  2006  . PACEMAKER INSERTION    . PICC LINE PLACE PERIPHERAL ([place] HX)  04/2014   R upper arm   . TOTAL KNEE ARTHROPLASTY WITH REVISION COMPONENTS Right 06/11/2014   Procedure: TOTAL KNEE ARTHROPLASTY WITH REVISION COMPONENTS REMOVE SPACER PLACE TKA;  Surgeon: [name], MD;  Location: [facility];  Service: Orthopedics;  Laterality: Right;     reports that he quit smoking about 33 years ago. He has never used smokeless tobacco. He reports that he does not drink alcohol or use drugs.  No Known Allergies  Family History  Problem Relation Age of Onset  . Heart attack Mother   . Heart attack Father      Prior  to Admission medications   Not on File  Of note, family and caregiver confirm that patient stopped taking all of his prescription medications at some point.  Physical Exam: Vitals:   06/17/16 2100 06/17/16 2102 06/17/16 2130 06/17/16 2239  BP: 125/74 125/74 (!) 128/51 137/83  Pulse: 77 75 70 79  Resp: (!) 27 (!) 29 (!) 25 (!) 21  Temp:  (!) 101.2 F (38.4 C)    TempSrc:      SpO2: 96% 98% 98% 98%  Weight:      Height:          Constitutional: NAD, calm, chronically ill appearing, intermittently sleepy but responds appropriately and he is following commands. Vitals:   06/17/16 2100 06/17/16 2102 06/17/16 2130 06/17/16 2239  BP: 125/74 125/74 (!) 128/51 137/83  Pulse: 77  75 70 79  Resp: (!) 27 (!) 29 (!) 25 (!) 21  Temp:  (!) 101.2 F (38.4 C)    TempSrc:      SpO2: 96% 98% 98% 98%  Weight:      Height:       Eyes: PERRL, lids and conjunctivae normal ENMT: Mucous membranes are dry. Posterior pharynx clear of any exudate or lesions. Poor dentition, missing several teeth. Neck: normal appearance, supple, no masses Respiratory: Bibasilar crackles.  No wheezing or ronchi.  Breathing unlabored.  Normal respiratory effort. No accessory muscle use.  Cardiovascular: Normal rate, regular rhythm, no murmurs / rubs / gallops. Trace edema bilaterally. 2+ pedal pulses. No carotid bruits.  GI: abdomen is soft and compressible.  No distention.  No tenderness.  No masses palpated.  Bowel sounds are present. Musculoskeletal:  No joint deformity in upper and lower extremities. Moves all four extremities spontaneously but generalized weakness.  No contractures. Normal muscle tone.  Skin: Pale, cool, dry.  Chronic venous stasis changes in legs.  Diffuse erythema to his groin but no tenderness, no apparent fluctuance or induration.  Scrotum does not appear swollen.  It is nontender.  He also has stage II sacral ulcers, present on admission.  There is a large area of erythema to his sacral/gluteal area as well, but again there is no apparent induration or areas of fluctuance. Neurologic: CN 2-12 grossly intact. Sensation intact, Strength symmetric bilaterally. Psychiatric: Oriented to person, affect appropriate, but judgment impaired.    Labs on Admission: I have personally reviewed following labs and imaging studies  CBC:  Recent Labs Lab 06/17/16 2012  WBC 20.1*  NEUTROABS 15.7*  HGB 13.9  HCT 42.6  MCV 86.8  PLT 315   Basic Metabolic Panel:  Recent Labs Lab 06/17/16 2012  NA 137  K 4.5  CL 104  CO2 22  GLUCOSE 411*  BUN 21*  CREATININE 1.87*  CALCIUM 8.7*   GFR: Estimated Creatinine Clearance: 28 mL/min (A) (by C-G formula based on SCr of 1.87 mg/dL  (H)). Liver Function Tests:  Recent Labs Lab 06/17/16 2012  AST 21  ALT 8*  ALKPHOS 70  BILITOT 0.6  PROT 7.1  ALBUMIN 3.3*   CBG:  Recent Labs Lab 06/17/16 1910  GLUCAP 368*   Urine analysis:    Component Value Date/Time   COLORURINE YELLOW 06/17/2016 1959   APPEARANCEUR CLEAR 06/17/2016 1959   LABSPEC 1.020 06/17/2016 1959   PHURINE 5.0 06/17/2016 1959   GLUCOSEU >=500 (A) 06/17/2016 1959   HGBUR SMALL (A) 06/17/2016 1959   BILIRUBINUR NEGATIVE 06/17/2016 1959   KETONESUR 5 (A) 06/17/2016 1959   PROTEINUR 30 (A) 06/17/2016 1959  UROBILINOGEN 0.2 05/18/2014 1634   NITRITE NEGATIVE 06/17/2016 1959   LEUKOCYTESUR NEGATIVE 06/17/2016 1959   Sepsis Labs:  Lactic acid level 4.42  Radiological Exams on Admission: Dg Chest 2 View  Result Date: 06/17/2016 CLINICAL DATA:  Lethargy and cough EXAM: CHEST  2 VIEW COMPARISON:  04/23/2014 FINDINGS: Cardiac shadow is stable. Pacing device is again noted. The lungs are well aerated bilaterally. No bony abnormality is noted. IMPRESSION: No active cardiopulmonary disease. Electronically Signed   By: Inez Catalina M.D.   On: 06/17/2016 20:38    EKG: Independently reviewed. A-V paced.  Assessment/Plan Principal Problem:   Sepsis ([place]) Active Problems:   Hypertension   Diabetes mellitus ([place])   Chronic kidney disease (CKD), stage III (moderate)   Cough   Leukocytosis   Pacemaker      Sepsis secondary to CAP vs groin cellulitis. --Continue vanc and zosyn for now --Blood cultures pending --Check urine legionella and streptococcal antigens --F/U flu screen --Check procalcitonin --Check CT chest without contrast --Nystatin to groin QID --Wound care consult for sacral ulcer  History of HTN --Blood pressure stable for now  Type 2 DM with hyperglycemia but no DKA --Low dose levemir now --SSI insulin coverage  History of PPM --Stable     DVT prophylaxis: Lovenox Code Status: FULL Family Communication: Son,  daughter-in-law, granddaughter, caregiver present in the ED at time of admission. Disposition Plan: To be determined. Consults called: NONE Admission status: Inpatient, stepdown unit.  I expect the patient will need inpatient services for greater than two midnights.   TIME SPENT: 70 minutes   Eber Jones MD [facility] Pager [phone]  If 7PM-7AM, please contact night-coverage [email or web address] Password [facility]  06/17/2016, 11:36 PM

## 2016-06-17 NOTE — ED Notes (Signed)
Provided ice water with permission from provider.

## 2016-06-17 NOTE — ED Notes (Signed)
Pt in CT.

## 2016-06-17 NOTE — ED Triage Notes (Signed)
A home caregiver phoned EMS d/t pt. "lethargy with cough x 2 days". Pt. Arrives drowsy and in no distress.

## 2016-06-17 NOTE — ED Notes (Signed)
Bed: RESA Expected date:  Expected time:  Means of arrival:  Comments: EMS/Possible Code Sepsis/hyperglycemia

## 2016-06-18 ENCOUNTER — Encounter (HOSPITAL_COMMUNITY): Payer: Self-pay | Admitting: *Deleted

## 2016-06-18 DIAGNOSIS — L8990 Pressure ulcer of unspecified site, unspecified stage: Secondary | ICD-10-CM | POA: Insufficient documentation

## 2016-06-18 DIAGNOSIS — D72829 Elevated white blood cell count, unspecified: Secondary | ICD-10-CM

## 2016-06-18 DIAGNOSIS — I10 Essential (primary) hypertension: Secondary | ICD-10-CM

## 2016-06-18 LAB — BASIC METABOLIC PANEL
Anion gap: 10 (ref 5–15)
BUN: 20 mg/dL (ref 6–20)
CO2: 21 mmol/L — ABNORMAL LOW (ref 22–32)
Calcium: 8 mg/dL — ABNORMAL LOW (ref 8.9–10.3)
Chloride: 109 mmol/L (ref 101–111)
Creatinine, Ser: 1.61 mg/dL — ABNORMAL HIGH (ref 0.61–1.24)
GFR calc Af Amer: 42 mL/min — ABNORMAL LOW (ref >60)
GFR calc non Af Amer: 36 mL/min — ABNORMAL LOW (ref >60)
Glucose, Bld: 302 mg/dL — ABNORMAL HIGH (ref 65–99)
Potassium: 4 mmol/L (ref 3.5–5.1)
Sodium: 140 mmol/L (ref 135–145)

## 2016-06-18 LAB — CBC
HCT: 38.7 % — ABNORMAL LOW (ref 39.0–52.0)
Hemoglobin: 12.7 g/dL — ABNORMAL LOW (ref 13.0–17.0)
MCH: 28.1 pg (ref 26.0–34.0)
MCHC: 32.8 g/dL (ref 30.0–36.0)
MCV: 85.6 fL (ref 78.0–100.0)
Platelets: 249 10*3/uL (ref 150–400)
RBC: 4.52 MIL/uL (ref 4.22–5.81)
RDW: 15.3 % (ref 11.5–15.5)
WBC: 18.7 10*3/uL — ABNORMAL HIGH (ref 4.0–10.5)

## 2016-06-18 LAB — BLOOD CULTURE ID PANEL (REFLEXED)
ACINETOBACTER BAUMANNII: NOT DETECTED
CANDIDA TROPICALIS: NOT DETECTED
Candida albicans: NOT DETECTED
Candida glabrata: NOT DETECTED
Candida krusei: NOT DETECTED
Candida parapsilosis: NOT DETECTED
ENTEROCOCCUS SPECIES: DETECTED — AB
Enterobacter cloacae complex: NOT DETECTED
Enterobacteriaceae species: NOT DETECTED
Escherichia coli: NOT DETECTED
HAEMOPHILUS INFLUENZAE: NOT DETECTED
KLEBSIELLA PNEUMONIAE: NOT DETECTED
Klebsiella oxytoca: NOT DETECTED
Listeria monocytogenes: NOT DETECTED
METHICILLIN RESISTANCE: NOT DETECTED
NEISSERIA MENINGITIDIS: NOT DETECTED
Proteus species: NOT DETECTED
Pseudomonas aeruginosa: NOT DETECTED
STREPTOCOCCUS PNEUMONIAE: NOT DETECTED
STREPTOCOCCUS PYOGENES: NOT DETECTED
STREPTOCOCCUS SPECIES: NOT DETECTED
Serratia marcescens: NOT DETECTED
Staphylococcus aureus (BCID): DETECTED — AB
Staphylococcus species: DETECTED — AB
Streptococcus agalactiae: NOT DETECTED
VANCOMYCIN RESISTANCE: NOT DETECTED

## 2016-06-18 LAB — MRSA PCR SCREENING: MRSA by PCR: POSITIVE — AB

## 2016-06-18 LAB — GLUCOSE, CAPILLARY
GLUCOSE-CAPILLARY: 352 mg/dL — AB (ref 65–99)
Glucose-Capillary: 286 mg/dL — ABNORMAL HIGH (ref 65–99)
Glucose-Capillary: 165 mg/dL — ABNORMAL HIGH (ref 65–99)
Glucose-Capillary: 138 mg/dL — ABNORMAL HIGH (ref 65–99)

## 2016-06-18 LAB — APTT: APTT: 34 s (ref 24–36)

## 2016-06-18 LAB — LACTIC ACID, PLASMA
LACTIC ACID, VENOUS: 5.4 mmol/L — AB (ref 0.5–1.9)
Lactic Acid, Venous: 2.2 mmol/L (ref 0.5–1.9)

## 2016-06-18 LAB — PROTIME-INR
INR: 1.29
Prothrombin Time: 16.1 seconds — ABNORMAL HIGH (ref 11.4–15.2)

## 2016-06-18 LAB — PROCALCITONIN: Procalcitonin: 1.94 ng/mL

## 2016-06-18 LAB — STREP PNEUMONIAE URINARY ANTIGEN: STREP PNEUMO URINARY ANTIGEN: NEGATIVE

## 2016-06-18 MED ORDER — FLUCONAZOLE 100 MG PO TABS
100.0000 mg | ORAL_TABLET | Freq: Every day | ORAL | Status: DC
  Administered 2016-06-18 – 2016-06-25 (×6): 100 mg via ORAL
  Filled 2016-06-18 (×6): qty 1

## 2016-06-18 MED ORDER — CHLORHEXIDINE GLUCONATE 0.12 % MT SOLN
15.0000 mL | Freq: Two times a day (BID) | OROMUCOSAL | Status: DC
  Administered 2016-06-18 – 2016-07-06 (×36): 15 mL via OROMUCOSAL
  Filled 2016-06-18 (×31): qty 15

## 2016-06-18 MED ORDER — CHLORHEXIDINE GLUCONATE CLOTH 2 % EX PADS
6.0000 | MEDICATED_PAD | Freq: Every day | CUTANEOUS | Status: AC
  Administered 2016-06-19 – 2016-06-22 (×5): 6 via TOPICAL

## 2016-06-18 MED ORDER — VANCOMYCIN HCL IN DEXTROSE 1-5 GM/200ML-% IV SOLN
1000.0000 mg | INTRAVENOUS | Status: DC
  Administered 2016-06-18 – 2016-06-21 (×4): 1000 mg via INTRAVENOUS
  Filled 2016-06-18 (×3): qty 200

## 2016-06-18 MED ORDER — NAFCILLIN SODIUM 2 G IJ SOLR
2.0000 g | INTRAMUSCULAR | Status: DC
  Filled 2016-06-18: qty 2000

## 2016-06-18 MED ORDER — MUPIROCIN 2 % EX OINT
1.0000 "application " | TOPICAL_OINTMENT | Freq: Two times a day (BID) | CUTANEOUS | Status: AC
  Administered 2016-06-18 – 2016-06-22 (×10): 1 via NASAL
  Filled 2016-06-18: qty 22

## 2016-06-18 MED ORDER — SODIUM CHLORIDE 0.9 % IV BOLUS (SEPSIS)
1000.0000 mL | Freq: Once | INTRAVENOUS | Status: AC
  Administered 2016-06-18: 1000 mL via INTRAVENOUS

## 2016-06-18 MED ORDER — ENSURE ENLIVE PO LIQD: 237.0000 mL | Freq: Two times a day (BID) | ORAL | Status: DC

## 2016-06-18 MED ORDER — NAFCILLIN SODIUM IN DEXTROSE 1 GM/50ML IV SOLN: 2.0000 g | INTRAVENOUS | Status: DC

## 2016-06-18 MED ORDER — NAFCILLIN SODIUM 2 G IJ SOLR
2.0000 g | INTRAVENOUS | Status: DC
  Administered 2016-06-19 – 2016-06-22 (×15): 2 g via INTRAVENOUS
  Filled 2016-06-18 (×20): qty 2000

## 2016-06-18 MED ORDER — NAFCILLIN SODIUM 2 G IJ SOLR
2.0000 g | INTRAVENOUS | Status: DC
  Administered 2016-06-18: 2 g via INTRAVENOUS
  Filled 2016-06-18 (×4): qty 2000

## 2016-06-18 MED ORDER — ORAL CARE MOUTH RINSE
15.0000 mL | Freq: Two times a day (BID) | OROMUCOSAL | Status: DC
  Administered 2016-06-18 – 2016-07-06 (×29): 15 mL via OROMUCOSAL

## 2016-06-18 MED ORDER — NAFCILLIN SODIUM 2 G IJ SOLR
2.0000 g | INTRAVENOUS | Status: AC
  Administered 2016-06-18 – 2016-06-19 (×6): 2 g via INTRAVENOUS
  Filled 2016-06-18 (×6): qty 2000

## 2016-06-18 NOTE — Progress Notes (Signed)
PHARMACY - PHYSICIAN COMMUNICATION CRITICAL VALUE ALERT - BLOOD CULTURE IDENTIFICATION (BCID)  Results for orders placed or performed during the hospital encounter of 06/17/16  Blood Culture ID Panel (Reflexed) (Collected: 06/17/2016  8:12 PM)  Result Value Ref Range   Enterococcus species DETECTED (A) NOT DETECTED   Vancomycin resistance NOT DETECTED NOT DETECTED   Listeria monocytogenes NOT DETECTED NOT DETECTED   Staphylococcus species DETECTED (A) NOT DETECTED   Staphylococcus aureus DETECTED (A) NOT DETECTED   Methicillin resistance NOT DETECTED NOT DETECTED   Streptococcus species NOT DETECTED NOT DETECTED   Streptococcus agalactiae NOT DETECTED NOT DETECTED   Streptococcus pneumoniae NOT DETECTED NOT DETECTED   Streptococcus pyogenes NOT DETECTED NOT DETECTED   Acinetobacter baumannii NOT DETECTED NOT DETECTED   Enterobacteriaceae species NOT DETECTED NOT DETECTED   Enterobacter cloacae complex NOT DETECTED NOT DETECTED   Escherichia coli NOT DETECTED NOT DETECTED   Klebsiella oxytoca NOT DETECTED NOT DETECTED   Klebsiella pneumoniae NOT DETECTED NOT DETECTED   Proteus species NOT DETECTED NOT DETECTED   Serratia marcescens NOT DETECTED NOT DETECTED   Haemophilus influenzae NOT DETECTED NOT DETECTED   Neisseria meningitidis NOT DETECTED NOT DETECTED   Pseudomonas aeruginosa NOT DETECTED NOT DETECTED   Candida albicans NOT DETECTED NOT DETECTED   Candida glabrata NOT DETECTED NOT DETECTED   Candida krusei NOT DETECTED NOT DETECTED   Candida parapsilosis NOT DETECTED NOT DETECTED   Candida tropicalis NOT DETECTED NOT DETECTED    Name of physician (or Provider) Contacted: Dr. Ree Kida  Changes to prescribed antibiotics required: d/c zosyn, continue vancomycin  Lynelle Doctor 06/18/2016  3:55 PM

## 2016-06-18 NOTE — Progress Notes (Addendum)
      INFECTIOUS DISEASE ATTENDING ADDENDUM:   Date: 06/18/2016  Patient name: Randy [name]  Medical record number: [id number]  Date of birth: Nov 23, 1924        [facility] Antimicrobial Management Team Staphylococcus aureus bacteremia   Staphylococcus aureus bacteremia (SAB) is associated with a high rate of complications and mortality.  Specific aspects of clinical management are critical to optimizing the outcome of patients with SAB.  Therefore, the [facility] Antimicrobial Management Team [facility]) has initiated an intervention aimed at improving the management of SAB at [facility].  To do so, Infectious Diseases physicians are providing an evidence-based consult for the management of all patients with SAB.     Yes No Comments  Perform follow-up blood cultures (even if the patient is afebrile) to ensure clearance of bacteremia [x]  []  DO NOT PLACE PICC UNTIL CULTURES CLEARED AT 4-5 DAYS  Remove vascular catheter and obtain follow-up blood cultures after the removal of the catheter [x]  []  NOT APPLICABLE  Perform echocardiography to evaluate for endocarditis (transthoracic ECHO is 40-50% sensitive, TEE is > 90% sensitive) [x]  []  Please keep in mind, that neither test can definitively EXCLUDE endocarditis, and that should clinical suspicion remain high for endocarditis the patient should then still be treated with an "endocarditis" duration of therapy = 6 weeks   HE WILL NEED TEE  Consult electrophysiologist to evaluate implanted cardiac device (pacemaker, ICD) [x]  []  HE NEEDS EP CONSULT, HE HAS BY DEFINITION INFECTED DEVICE WITH MSSA  Ensure source control []  []  Have all abscesses been drained effectively? Have deep seeded infections (septic joints or osteomyelitis) had appropriate surgical debridement?  NOT CLEAR SOURCE  Investigate for "metastatic" sites of infection []  []  Does the patient have ANY symptom or physical exam finding that would suggest a deeper infection (back or neck  pain that may be suggestive of vertebral osteomyelitis or epidural abscess, muscle pain that could be a symptom of pyomyositis)?  Keep in mind that for deep seeded infections MRI imaging with contrast is preferred rather than other often insensitive tests such as plain x-rays, especially early in a patient's presentation.  Change antibiotic therapy to NAVCILLIN GIVEN CONCERN FOR CNS INFECTION []  []  Beta-lactam antibiotics are preferred for MSSA due to higher cure rates.   If on Vancomycin, goal trough should be 15 - 20 mcg/mL  Estimated duration of IV antibiotic therapy:  6 WEEKS   []  []  Consult case management for probably prolonged outpatient IV antibiotic therapy   FORMAL CONSULT TO FOLLOW IN AM  EP CONSULT AND DECISION RE DEVICE REMOVAL IS CRITICAL HERE  NOTE PATIENT ALSO WITH AN ENTEROCOCCUS IN CULTURE SO FINE TO CONTINUE VANCOMYCIN FOR NOW IN CASE THIS IS AMP R ENTEROCOCCUS  COULD END UP CONSOLIDATING TO UNASYN FOR MSSA AND AMP SENSITIVE ENTEROCOCCUS IF THIS IS AN AMP S ENTEROCOCCUS. EP AGAIN IS CRITICAL RE DEVICE    Alcide Evener 06/18/2016, 4:41 PM

## 2016-06-18 NOTE — Progress Notes (Signed)
Initial Nutrition Assessment  DOCUMENTATION CODES:   Not applicable  INTERVENTION:  - Will order Ensure Enlive po BID, each supplement provides 350 kcal and 20 grams of protein - Continue to encourage PO intakes of meals and supplements. - RD will continue to monitor for nutrition-related needs.  NUTRITION DIAGNOSIS:   Unintentional weight loss related to acute illness as evidenced by percent weight loss.  GOAL:   Patient will meet greater than or equal to 90% of their needs  MONITOR:   PO intake, Supplement acceptance, Weight trends, Labs, Skin  REASON FOR ASSESSMENT:   Low Braden  ASSESSMENT:   83 y.o. gentleman with a history of mild dementia/cognitive impairment, CAD with prior stent placement, history of atrial fibrillation S/P PPM implant (CHADS-Vasc score of at least four, no anticoagulation), HTN, HLD, DM, and CKD 3 with baseline creatinine around 1.3-1.5 who was referred to the ED via EMS by one of his caregivers for evaluation of increased lethargy, mental status changes, and cough.  The patient is nonambulatory at baseline and spends most of his day sitting up a recliner.  He has caregiver support 24 hours/day.  He has had cough for 4-5 days.  He has had two known sick contacts (son-in-law and caregiver).  He has had yellow sputum.  No chest pain or shortness of breath.  No nausea or vomiting.  No abdominal pain.  No dysuria.  No diarrhea.  Pt seen for low Braden. BMI indicates overweight, appropriate for advanced age. No intakes documented since admission. Pt sleeping at this time after RNs in to patients room x3 over the past half hour. Spoke with RN who reports no family/visitors have been present today. She states that pt has been very lethargic throughout the day and responds "yes" to all questions asked. RN states pt has been too lethargic to attempt to feed but she is going to try to give him something for dinner, if possible.  Unable to complete physical assessment  at this time. Per chart review, pt has lost 12 lbs (5.7% body weight) in the past 2 months which is significant for time frame. Will monitor weight trends closely during hospitalization. Unable to state malnutrition at this time based on limited information.  Medications reviewed; sliding scale Novolog, 5 units Levemir BID. Labs reviewed; CBGs: 165, 286, and 352 mg/dL today, creatinine: 1.61 mg/dL, Ca: 8 mg/dL, GFR: 36 mL/min.    Diet Order:  Diet heart healthy/carb modified Room service appropriate? Yes; Fluid consistency: Thin  Skin:  Wound (see comment) (Stage 2 sacral pressure injury)  Last BM:  5/3  Height:   Ht Readings from Last 1 Encounters:  06/18/16 5\' 11"  (1.803 m)    Weight:   Wt Readings from Last 1 Encounters:  06/18/16 198 lb 10.2 oz (90.1 kg)    Ideal Body Weight:  78.18 kg  BMI:  Body mass index is 27.7 kg/m.  Estimated Nutritional Needs:   Kcal:  1620-1805 (18-20 kcal/kg)  Protein:  75-90 grams  Fluid:  1.8-2 L/day  EDUCATION NEEDS:   No education needs identified at this time    Jarome Matin, MS, RD, LDN, CNSC Inpatient Clinical Dietitian Pager # [phone] After hours/weekend pager # [phone]

## 2016-06-18 NOTE — Care Management Note (Signed)
Case Management Note  Patient Details  Name: OCIE TINO MRN: [id number] Date of Birth: 03/18/1924  Subjective/Objective:        Sepsis temp101.9/wcb-18.7/lung cxr-clear/            Action/Plan:  Lives at home has personal care givers  [id number] Rosana Hoes BSN, [place], CCM/[id number] Chart review for patient progression. Chart review for case management needs: Next review due on [id number].  Expected Discharge Date:                  Expected Discharge Plan:  Home/Self Care  In-House Referral:     Discharge planning Services  CM Consult  Post Acute Care Choice:    Choice offered to:     DME Arranged:    DME Agency:     HH Arranged:    HH Agency:     Status of Service:  In process, will continue to follow  If discussed at Long Length of Stay Meetings, dates discussed:    Additional Comments:  Leeroy Cha, RN 06/18/2016, 9:27 AM

## 2016-06-18 NOTE — Progress Notes (Signed)
Inpatient Diabetes Program Recommendations  AACE/ADA: New Consensus Statement on Inpatient Glycemic Control (2015)  Target Ranges:  Prepandial:   less than 140 mg/dL      Peak postprandial:   less than 180 mg/dL (1-2 hours)      Critically ill patients:  140 - 180 mg/dL   Results for CHAZE, HRUSKA (MRN [id number]) as of 06/18/2016 09:17  Ref. Range 06/17/2016 19:10 06/18/2016 02:28 06/18/2016 08:41  Glucose-Capillary Latest Ref Range: 65 - 99 mg/dL 368 (H) 352 (H) 286 (H)   Review of Glycemic Control  Diabetes history: DM 2 Outpatient Diabetes medications: None Current orders for Inpatient glycemic control: Levemir 5 units BID, Novolog Resistant + HS scale  Inpatient Diabetes Program Recommendations:    Consider A1c level to assess glucose control over the past 2-3 months. Glucose 280's this am Consider increasing Levemir to 7 units BID  Thanks,  Tama Headings RN, MSN, [facility] Inpatient Diabetes Coordinator Team Pager [phone] (8a-5p)

## 2016-06-18 NOTE — Consult Note (Signed)
[facility] Nurse wound consult note Reason for Consult: Stage 2 pressure injury Wound type: Severe MASD (moisture associated skin damage) Patient from home, has 24hour CG.  Incontinent of bowel and bladder.  Bedside staff unable to place condom cath due to mico anatomy, I have assisted staff to place external collection device to hopefully keep patient dry and improve skin condition Wound bed: Patient has skin damage on the bilateral buttocks with superficial skin peeling and with severe fungal overgrowth and MASD in bilateral groin, pubic area, scrotum. Satellite lesions noted. Drainage (amount, consistency, odor) none Periwound: intact Dressing procedure/placement/frequency: Maintain external urine collection device, connect to BSD. If needed to replace, use adhesive remover wipes and remove gently If needed to replace use Kellie Simmering # [id number], and skin prep area thoroughly  Will ask hospitalist to consider PO antifungal as well. LALM for moisture management upon transfer from the ICU, currently on correct mattress.  Discussed POC with patient and bedside nurse.  Re consult if needed, will not follow at this time. Thanks  Magdala Brahmbhatt [employer], RN,CWOCN, CNS [phone])

## 2016-06-18 NOTE — ED Notes (Signed)
Will give bedside report.  

## 2016-06-18 NOTE — Progress Notes (Signed)
PROGRESS NOTE    Randy [name]  XIP:[id number] DOB: Dec 28, 1924 DOA: 06/17/2016 PCP: Pcp Not In System   Chief Complaint  Patient presents with  . Altered Mental Status  . Hyperglycemia  . Fever    Brief Narrative:  HPI on 06/17/2016 by Dr. Lily Kocher Randy [name] is a 83 y.o. gentleman with a history of mild dementia/cognitive impairment, CAD with prior stent placement, history of atrial fibrillation S/P PPM implant (CHADS-Vasc score of at least four, no anticoagulation), HTN, HLD, DM, and CKD 3 with baseline creatinine around 1.3-1.5 who was referred to the ED via EMS by one of his caregivers for evaluation of increased lethargy, mental status changes, and cough.  The patient is nonambulatory at baseline and spends most of his day sitting up a recliner.  He has caregiver support 24 hours/day.  He has had cough for 4-5 days.  He has had two known sick contacts (son-in-law and caregiver).  He has had yellow sputum.  No chest pain or shortness of breath.  No nausea or vomiting.  No abdominal pain.  No dysuria.  No diarrhea. Today, he was noted to have increased lethargy and muffled speech with slurring.  No other focal weakenss.  911 was called and the patient was transferred to the ED for evaluation. No recent hospitalizations.  No recent antibiotics.  He has been using OTC mucinex for his symptoms. No overt signs or symptoms of aspiration.  Assessment & Plan   Sepsis secondary to unknown source -Suspected respiratory source vs groin cellulitis -Presented with fever, tachycardia, tachypnea, leukocytosis -Patient also had elevated pro calcitonin level as well as lactic acid -Continue vancomycin and zosyn -Chest x-ray showed no cardiopulmonary process -CT chest showed subpleural fibrosis, no acute pulmonary infiltrates. -Influenza PCR negative -Strep pneumonia urine antigen negative -UA unremarkable for infection -Blood cultures and respiratory culture pending  Essential  hypertension -Currently on no home medications -Continue to monitor, will add on medications as needed  Diabetes mellitus, type II -Currently on no home medications -Continue insulin sliding scale, Levemir, CBG monitoring  History of atrial fibrillation -CHADSVASC 4 -Currently on no anticoagulation -Status post pacemaker placement  Chronic kidney disease, stage III -Chronic appears to be close to baseline at 1.6 -Continue to monitor BMP  DVT Prophylaxis  lovenox  Code Status: Full  Family Communication: None at bedside  Disposition Plan: Admitted. Continue to monitor in stepdown.  Consultants None  Procedures  None  Antibiotics   Anti-infectives    Start     Dose/Rate Route Frequency Ordered Stop   06/18/16 2100  vancomycin (VANCOCIN) IVPB 750 mg/150 ml premix  Status:  Discontinued     750 mg 150 mL/hr over 60 Minutes Intravenous Every 24 hours 06/17/16 2103 06/17/16 2103   06/18/16 2100  vancomycin (VANCOCIN) IVPB 1000 mg/200 mL premix     1,000 mg 200 mL/hr over 60 Minutes Intravenous Every 24 hours 06/17/16 2103     06/18/16 0400  piperacillin-tazobactam (ZOSYN) IVPB 3.375 g     3.375 g 12.5 mL/hr over 240 Minutes Intravenous Every 8 hours 06/17/16 2103     06/17/16 2045  piperacillin-tazobactam (ZOSYN) IVPB 3.375 g     3.375 g 100 mL/hr over 30 Minutes Intravenous  Once 06/17/16 2032 06/17/16 2152   06/17/16 2045  vancomycin (VANCOCIN) IVPB 1000 mg/200 mL premix     1,000 mg 200 mL/hr over 60 Minutes Intravenous  Once 06/17/16 2032 06/17/16 2334      Subjective:   Randy  [name] seen and examined today.  Patient states he currently feels tired. Denies chest pain, shortness of breath, abdominal pain, nausea, vomiting, diarrhea, constipation.   Objective:   Vitals:   06/18/16 0800 06/18/16 0900 06/18/16 1000 06/18/16 1200  BP: (!) 122/55 (!) 116/45 (!) 115/45 115/83  Pulse: 70 67 73 74  Resp: 18 14 (!) 29 (!) 21  Temp:      TempSrc:      SpO2: 100%  99% 97% 100%  Weight:      Height:        Intake/Output Summary (Last 24 hours) at 06/18/16 1346 Last data filed at 06/18/16 1225  Gross per 24 hour  Intake          2201.58 ml  Output                0 ml  Net          2201.58 ml   Filed Weights   06/17/16 1931 06/18/16 0056  Weight: 81.6 kg (180 lb) 90.1 kg (198 lb 10.2 oz)    Exam  General: Well developed, well nourished, NAD, appears stated age  HEENT: NCAT, mucous membranes dry, poor dentition.   Cardiovascular: S1 S2 auscultated, RRR, 2/6 SEM  Respiratory: Diminished breath sounds.   Abdomen: Soft, nontender, nondistended, + bowel sounds  Extremities: warm dry without cyanosis clubbing or edema  Neuro: AAOx3, nonfocal  Skin: Without rashes exudates or nodules  Psych: Appropriate   Data Reviewed: I have personally reviewed following labs and imaging studies  CBC:  Recent Labs Lab 06/17/16 2012 06/18/16 0812  WBC 20.1* 18.7*  NEUTROABS 15.7*  --   HGB 13.9 12.7*  HCT 42.6 38.7*  MCV 86.8 85.6  PLT 273 030   Basic Metabolic Panel:  Recent Labs Lab 06/17/16 2012 06/18/16 0812  NA 137 140  K 4.5 4.0  CL 104 109  CO2 22 21*  GLUCOSE 411* 302*  BUN 21* 20  CREATININE 1.87* 1.61*  CALCIUM 8.7* 8.0*   GFR: Estimated Creatinine Clearance: 32.5 mL/min (A) (by C-G formula based on SCr of 1.61 mg/dL (H)). Liver Function Tests:  Recent Labs Lab 06/17/16 2012  AST 21  ALT 8*  ALKPHOS 70  BILITOT 0.6  PROT 7.1  ALBUMIN 3.3*   No results for input(s): LIPASE, AMYLASE in the last 168 hours. No results for input(s): AMMONIA in the last 168 hours. Coagulation Profile:  Recent Labs Lab 06/18/16 0042  INR 1.29   Cardiac Enzymes: No results for input(s): CKTOTAL, CKMB, CKMBINDEX, TROPONINI in the last 168 hours. BNP (last 3 results) No results for input(s): PROBNP in the last 8760 hours. HbA1C: No results for input(s): HGBA1C in the last 72 hours. CBG:  Recent Labs Lab 06/17/16 1910  06/18/16 0228 06/18/16 0841 06/18/16 1221  GLUCAP 368* 352* 286* 165*   Lipid Profile: No results for input(s): CHOL, HDL, LDLCALC, TRIG, CHOLHDL, LDLDIRECT in the last 72 hours. Thyroid Function Tests: No results for input(s): TSH, T4TOTAL, FREET4, T3FREE, THYROIDAB in the last 72 hours. Anemia Panel: No results for input(s): VITAMINB12, FOLATE, FERRITIN, TIBC, IRON, RETICCTPCT in the last 72 hours. Urine analysis:    Component Value Date/Time   COLORURINE YELLOW 06/17/2016 1959   APPEARANCEUR CLEAR 06/17/2016 1959   LABSPEC 1.020 06/17/2016 1959   PHURINE 5.0 06/17/2016 1959   GLUCOSEU >=500 (A) 06/17/2016 1959   HGBUR SMALL (A) 06/17/2016 [place] NEGATIVE 06/17/2016 1959   KETONESUR 5 (A) 06/17/2016 1959  PROTEINUR 30 (A) 06/17/2016 1959   UROBILINOGEN 0.2 05/18/2014 1634   NITRITE NEGATIVE 06/17/2016 1959   LEUKOCYTESUR NEGATIVE 06/17/2016 1959   Sepsis Labs: @LABRCNTIP (procalcitonin:4,lacticidven:4)  ) Recent Results (from the past 240 hour(s))  MRSA PCR Screening     Status: Abnormal   Collection Time: 06/18/16  1:40 AM  Result Value Ref Range Status   MRSA by PCR POSITIVE (A) NEGATIVE Final    Comment:        The GeneXpert MRSA Assay (FDA approved for NASAL specimens only), is one component of a comprehensive MRSA colonization surveillance program. It is not intended to diagnose MRSA infection nor to guide or monitor treatment for MRSA infections. RESULT CALLED TO, READ BACK BY AND VERIFIED WITH: C.SMITH,RN [id number] 06/18/16 W.SHEA       Radiology Studies: Dg Chest 2 View  Result Date: 06/17/2016 CLINICAL DATA:  Lethargy and cough EXAM: CHEST  2 VIEW COMPARISON:  04/23/2014 FINDINGS: Cardiac shadow is stable. Pacing device is again noted. The lungs are well aerated bilaterally. No bony abnormality is noted. IMPRESSION: No active cardiopulmonary disease. Electronically Signed   By: Inez Catalina M.D.   On: 06/17/2016 20:38   Ct Chest Wo  Contrast  Result Date: 06/18/2016 CLINICAL DATA:  Lethargy and cough for 2 days EXAM: CT CHEST WITHOUT CONTRAST TECHNIQUE: Multidetector CT imaging of the chest was performed following the standard protocol without IV contrast. COMPARISON:  06/17/2016, CT 04/17/2016 FINDINGS: Cardiovascular: Limited evaluation without intravenous contrast. Aortic atherosclerosis. No aneurysmal dilatation. Coronary artery calcification. Partially visualized cardiac pacing leads. Mild cardiomegaly. No large pericardial effusion. Mediastinum/Nodes: Midline trachea. Few prominent sub- carinal and periesophageal lymph nodes, measuring up to 13 mm. Esophagus within normal limits. Esophagus Lungs/Pleura: Mild subpleural fibrosis. No focal consolidation, pleural effusion, or pneumothorax. Upper Abdomen: No acute abnormality. 9.6 cm cyst in the mid to upper right kidney. Vague hypodensity in the upper pole of the left kidney also consistent with a cyst Musculoskeletal: Degenerative changes of the spine. No acute or suspicious bone lesion. IMPRESSION: 1. Mild subpleural fibrosis. No acute pulmonary infiltrates are visualized. 2. Mild nonspecific mediastinal adenopathy 3. Cardiomegaly 4. Bilateral kidney cysts, measuring up to 9.6 cm on the right. Electronically Signed   By: Donavan Foil M.D.   On: 06/18/2016 00:26     Scheduled Meds: . chlorhexidine  15 mL Mouth Rinse BID  . Chlorhexidine Gluconate Cloth  6 each Topical Q0600  . enoxaparin (LOVENOX) injection  30 mg Subcutaneous Q24H  . insulin aspart  0-20 Units Subcutaneous TID WC  . insulin aspart  0-5 Units Subcutaneous QHS  . insulin detemir  5 Units Subcutaneous BID  . mouth rinse  15 mL Mouth Rinse q12n4p  . mupirocin ointment  1 application Nasal BID  . nystatin   Topical TID   Continuous Infusions: . sodium chloride 100 mL/hr at 06/18/16 0100  . piperacillin-tazobactam (ZOSYN)  IV 3.375 g (06/18/16 1225)  . vancomycin       LOS: 1 day   Time Spent in minutes    30 minutes  Vaniyah Lansky D.O. on 06/18/2016 at 1:46 PM  Between 7am to 7pm - Pager - [phone]  After 7pm go to [email or web address] - password [email or web address]  And look for the night coverage person covering for me after hours  [facility] Office  [phone]

## 2016-06-19 ENCOUNTER — Inpatient Hospital Stay (HOSPITAL_COMMUNITY): Payer: Medicare Other

## 2016-06-19 DIAGNOSIS — Y712 Prosthetic and other implants, materials and accessory cardiovascular devices associated with adverse incidents: Secondary | ICD-10-CM

## 2016-06-19 DIAGNOSIS — T8454XA Infection and inflammatory reaction due to internal left knee prosthesis, initial encounter: Secondary | ICD-10-CM

## 2016-06-19 DIAGNOSIS — T827XXA Infection and inflammatory reaction due to other cardiac and vascular devices, implants and grafts, initial encounter: Secondary | ICD-10-CM

## 2016-06-19 DIAGNOSIS — I2510 Atherosclerotic heart disease of native coronary artery without angina pectoris: Secondary | ICD-10-CM

## 2016-06-19 DIAGNOSIS — M00062 Staphylococcal arthritis, left knee: Secondary | ICD-10-CM

## 2016-06-19 DIAGNOSIS — B952 Enterococcus as the cause of diseases classified elsewhere: Secondary | ICD-10-CM

## 2016-06-19 DIAGNOSIS — Z8249 Family history of ischemic heart disease and other diseases of the circulatory system: Secondary | ICD-10-CM

## 2016-06-19 DIAGNOSIS — R7881 Bacteremia: Secondary | ICD-10-CM

## 2016-06-19 DIAGNOSIS — Z87891 Personal history of nicotine dependence: Secondary | ICD-10-CM

## 2016-06-19 DIAGNOSIS — Z8619 Personal history of other infectious and parasitic diseases: Secondary | ICD-10-CM

## 2016-06-19 DIAGNOSIS — F0390 Unspecified dementia without behavioral disturbance: Secondary | ICD-10-CM

## 2016-06-19 DIAGNOSIS — A4101 Sepsis due to Methicillin susceptible Staphylococcus aureus: Secondary | ICD-10-CM

## 2016-06-19 DIAGNOSIS — F0280 Dementia in other diseases classified elsewhere without behavioral disturbance: Secondary | ICD-10-CM

## 2016-06-19 DIAGNOSIS — G301 Alzheimer's disease with late onset: Secondary | ICD-10-CM

## 2016-06-19 DIAGNOSIS — L304 Erythema intertrigo: Secondary | ICD-10-CM

## 2016-06-19 LAB — BLOOD CULTURE ID PANEL (REFLEXED)
ACINETOBACTER BAUMANNII: NOT DETECTED
CANDIDA PARAPSILOSIS: NOT DETECTED
Candida albicans: NOT DETECTED
Candida glabrata: NOT DETECTED
Candida krusei: NOT DETECTED
Candida tropicalis: NOT DETECTED
ENTEROCOCCUS SPECIES: NOT DETECTED
Enterobacter cloacae complex: NOT DETECTED
Enterobacteriaceae species: NOT DETECTED
Escherichia coli: NOT DETECTED
HAEMOPHILUS INFLUENZAE: NOT DETECTED
KLEBSIELLA OXYTOCA: NOT DETECTED
Klebsiella pneumoniae: NOT DETECTED
Listeria monocytogenes: NOT DETECTED
METHICILLIN RESISTANCE: NOT DETECTED
Neisseria meningitidis: NOT DETECTED
PSEUDOMONAS AERUGINOSA: NOT DETECTED
Proteus species: NOT DETECTED
SERRATIA MARCESCENS: NOT DETECTED
STAPHYLOCOCCUS AUREUS BCID: DETECTED — AB
STREPTOCOCCUS PNEUMONIAE: NOT DETECTED
Staphylococcus species: DETECTED — AB
Streptococcus agalactiae: NOT DETECTED
Streptococcus pyogenes: NOT DETECTED
Streptococcus species: NOT DETECTED

## 2016-06-19 LAB — CULTURE, BLOOD (ROUTINE X 2): Special Requests: ADEQUATE

## 2016-06-19 LAB — BASIC METABOLIC PANEL
ANION GAP: 9 (ref 5–15)
BUN: 20 mg/dL (ref 6–20)
CHLORIDE: 110 mmol/L (ref 101–111)
CO2: 22 mmol/L (ref 22–32)
Calcium: 8.1 mg/dL — ABNORMAL LOW (ref 8.9–10.3)
Creatinine, Ser: 1.71 mg/dL — ABNORMAL HIGH (ref 0.61–1.24)
GFR calc Af Amer: 39 mL/min — ABNORMAL LOW (ref >60)
GFR, EST NON AFRICAN AMERICAN: 33 mL/min — AB (ref >60)
GLUCOSE: 209 mg/dL — AB (ref 65–99)
POTASSIUM: 3.6 mmol/L (ref 3.5–5.1)
Sodium: 141 mmol/L (ref 135–145)

## 2016-06-19 LAB — GLUCOSE, CAPILLARY
GLUCOSE-CAPILLARY: 207 mg/dL — AB (ref 65–99)
GLUCOSE-CAPILLARY: 201 mg/dL — AB (ref 65–99)
GLUCOSE-CAPILLARY: 101 mg/dL — AB (ref 65–99)
Glucose-Capillary: 176 mg/dL — ABNORMAL HIGH (ref 65–99)
Glucose-Capillary: 172 mg/dL — ABNORMAL HIGH (ref 65–99)

## 2016-06-19 LAB — ECHOCARDIOGRAM COMPLETE
Height: 71 in
WEIGHTICAEL: 3178.15 [oz_av]

## 2016-06-19 LAB — SYNOVIAL CELL COUNT + DIFF, W/ CRYSTALS
CRYSTALS FLUID: NONE SEEN
MONOCYTE-MACROPHAGE-SYNOVIAL FLUID: 2 % — AB (ref 50–90)
NEUTROPHIL, SYNOVIAL: 98 % — AB (ref 0–25)
WBC, SYNOVIAL: 170970 /mm3 — AB (ref 0–200)

## 2016-06-19 LAB — CBC
HCT: 39.8 % (ref 39.0–52.0)
HEMOGLOBIN: 13.2 g/dL (ref 13.0–17.0)
MCH: 28.5 pg (ref 26.0–34.0)
MCHC: 33.2 g/dL (ref 30.0–36.0)
MCV: 86 fL (ref 78.0–100.0)
PLATELETS: 268 10*3/uL (ref 150–400)
RBC: 4.63 MIL/uL (ref 4.22–5.81)
RDW: 15.4 % (ref 11.5–15.5)
WBC: 20.6 10*3/uL — AB (ref 4.0–10.5)

## 2016-06-19 LAB — PATHOLOGIST SMEAR REVIEW

## 2016-06-19 LAB — LEGIONELLA PNEUMOPHILA SEROGP 1 UR AG: L. PNEUMOPHILA SEROGP 1 UR AG: NEGATIVE

## 2016-06-19 MED ORDER — PERFLUTREN LIPID MICROSPHERE
1.0000 mL | INTRAVENOUS | Status: AC | PRN
  Administered 2016-06-19: 4 mL via INTRAVENOUS
  Filled 2016-06-19: qty 10

## 2016-06-19 MED ORDER — PERFLUTREN LIPID MICROSPHERE
INTRAVENOUS | Status: AC
  Filled 2016-06-19: qty 10

## 2016-06-19 NOTE — Evaluation (Signed)
Clinical/Bedside Swallow Evaluation Patient Details  Name: Randy [name] MRN: [id number] Date of Birth: May 05, 1924  Today's Date: 06/19/2016 Time: SLP Start Time (ACUTE ONLY): 1100 SLP Stop Time (ACUTE ONLY): 1128 SLP Time Calculation (min) (ACUTE ONLY): 28 min  Past Medical History:  Past Medical History:  Diagnosis Date  . Anxiety   . Arthritis    R knee,   . CKD (chronic kidney disease)   . Coronary artery disease   . Diabetes mellitus without complication ([place])   . Dysrhythmia    PAF;,sss s/p St. Jude PPM  . Hyperlipidemia   . Hypertension   . MI (myocardial infarction) ([place]) 2014  . Presence of permanent cardiac pacemaker    Past Surgical History:  Past Surgical History:  Procedure Laterality Date  . CHOLECYSTECTOMY    . CORONARY ANGIOPLASTY     mLAD '13, RCA DES 03/2013  . EXCISIONAL TOTAL KNEE ARTHROPLASTY WITH ANTIBIOTIC SPACERS Right 04/20/2014   Procedure: IRRIGATION AND DEBRIDMENT RIGHT TOTAL KNEE REMOVE  ACL IMPLANTED AND PLACE SPACER;  Surgeon: [name], MD;  Location: [place];  Service: Orthopedics;  Laterality: Right;  . HEMORROIDECTOMY    . KNEE SURGERY  2006  . PACEMAKER INSERTION    . PICC LINE PLACE PERIPHERAL ([place] HX)  04/2014   R upper arm   . TOTAL KNEE ARTHROPLASTY WITH REVISION COMPONENTS Right 06/11/2014   Procedure: TOTAL KNEE ARTHROPLASTY WITH REVISION COMPONENTS REMOVE SPACER PLACE TKA;  Surgeon: [name], MD;  Location: [place];  Service: Orthopedics;  Laterality: Right;   HPI:  pt admit to [facility] with AMS, cough- found to be bacterial septic.  PMH + for DM, MI, no CVA hx. CT chest and CXR negative.  Swallow eval ordered due to pt having dysphagia.    Assessment / Plan / Recommendation Clinical Impression  Pt demonstrates indications of oropharyngeal dysphagia with concern for gross pharyngeal residuals and possible aspiration.  Multiple swallows noted across all consistencies - up to 4-6 with all po with delayed cough.  Recommend MBS, concern for  dysphagia prior to admission as pt does admit to issues with "strangling" some and doing multiple swallows.  Pt also with neck extension - ? source.  MBS indicated, rec npo x ice pending test.  SLP Visit Diagnosis: Dysphagia, oropharyngeal phase (R13.12)    Aspiration Risk  Severe aspiration risk;Risk for inadequate nutrition/hydration    Diet Recommendation NPO;Ice chips PRN after oral care        Other  Recommendations     Follow up Recommendations        Frequency and Duration            Prognosis        Swallow Study   General Date of Onset: 06/19/16 HPI: pt admit to [facility] with AMS, cough- found to be bacterial septic.  PMH + for DM, MI, no CVA hx. CT chest and CXR negative.  Swallow eval ordered due to pt having dysphagia.  Type of Study: Bedside Swallow Evaluation Diet Prior to this Study: Regular;Thin liquids Temperature Spikes Noted: Yes Respiratory Status: Room air History of Recent Intubation: No Behavior/Cognition: Alert;Cooperative Oral Care Completed by SLP: Yes Oral Cavity - Dentition: Adequate natural dentition Self-Feeding Abilities: Other (Comment) (pt reports he feeds himself) Patient Positioning: Upright in bed Baseline Vocal Quality: Low vocal intensity Volitional Cough: Strong Volitional Swallow: Able to elicit (after oral moisture)    Oral/Motor/Sensory Function Overall Oral Motor/Sensory Function: Mild impairment Lingual Strength: Reduced (deviates to right slightly)  Velum:  (deviates to left slightly)   Ice Chips Ice chips: Impaired Presentation: Spoon Pharyngeal Phase Impairments: Multiple swallows   Thin Liquid Thin Liquid: Not tested    Nectar Thick Nectar Thick Liquid: Impaired Presentation: Spoon;Cup Pharyngeal Phase Impairments: Decreased hyoid-laryngeal movement;Multiple swallows;Cough - Delayed;Throat Clearing - Immediate   Honey Thick Honey Thick Liquid: Not tested   Puree Puree: Impaired Presentation: Self Fed;Spoon Pharyngeal  Phase Impairments: Decreased hyoid-laryngeal movement;Multiple swallows   Solid   GO   Solid: Not tested        Claudie Fisherman, [place] [facility] SLP [phone]

## 2016-06-19 NOTE — Progress Notes (Signed)
PHARMACY - PHYSICIAN COMMUNICATION CRITICAL VALUE ALERT - BLOOD CULTURE IDENTIFICATION (BCID)  Results for orders placed or performed during the hospital encounter of 06/17/16  Blood Culture ID Panel (Reflexed) (Collected: 06/18/2016  6:22 PM)  Result Value Ref Range   Enterococcus species NOT DETECTED NOT DETECTED   Listeria monocytogenes NOT DETECTED NOT DETECTED   Staphylococcus species DETECTED (A) NOT DETECTED   Staphylococcus aureus DETECTED (A) NOT DETECTED   Methicillin resistance NOT DETECTED NOT DETECTED   Streptococcus species NOT DETECTED NOT DETECTED   Streptococcus agalactiae NOT DETECTED NOT DETECTED   Streptococcus pneumoniae NOT DETECTED NOT DETECTED   Streptococcus pyogenes NOT DETECTED NOT DETECTED   Acinetobacter baumannii NOT DETECTED NOT DETECTED   Enterobacteriaceae species NOT DETECTED NOT DETECTED   Enterobacter cloacae complex NOT DETECTED NOT DETECTED   Escherichia coli NOT DETECTED NOT DETECTED   Klebsiella oxytoca NOT DETECTED NOT DETECTED   Klebsiella pneumoniae NOT DETECTED NOT DETECTED   Proteus species NOT DETECTED NOT DETECTED   Serratia marcescens NOT DETECTED NOT DETECTED   Haemophilus influenzae NOT DETECTED NOT DETECTED   Neisseria meningitidis NOT DETECTED NOT DETECTED   Pseudomonas aeruginosa NOT DETECTED NOT DETECTED   Candida albicans NOT DETECTED NOT DETECTED   Candida glabrata NOT DETECTED NOT DETECTED   Candida krusei NOT DETECTED NOT DETECTED   Candida parapsilosis NOT DETECTED NOT DETECTED   Candida tropicalis NOT DETECTED NOT DETECTED    Name of physician (or Provider) Contacted:  Dr. Myna Hidalgo  Changes to prescribed antibiotics required: None, continue nafcillin/vancomycin per ID  (following for previous culture with MSSA and enterococcus).   Randy [name] 06/19/2016  7:59 PM

## 2016-06-19 NOTE — Progress Notes (Signed)
  Echocardiogram 2D Echocardiogram has been performed.  Randy [name] 06/19/2016, 2:36 PM

## 2016-06-19 NOTE — Consult Note (Signed)
[place] Nurse wound consult note Reason for Consult:Moisture Associated Skin damage to bilateral buttocks and coccyx.  Seen 06/18/16 by Domingo Mend.  Will add Boudreaux's butt paste to repel moisture and promote healing.  Urine collection device is in place.  Wound type:MASD Pressure Injury POA: N/A Measurement: 4.5 cm x 3 cm x 0.2 cm bilateral buttocks, extend around to gluteal fold.  Wound NWG:NFAO and moist, scabbed in perimeter.  Drainage (amount, consistency, odor) Minimal serosanguinous weeping.  Periwound:intact Dressing procedure/placement/frequency:Cleanse buttocks with soap and water and pat gently dry.  Apply Boudreaux's butt paste twice daily. Keep urine collection device in place and avoid the use of disposable briefs and underpads while in bed.  Will not follow at this time.  Please re-consult if needed.  Domenic Moras RN BSN [place] Pager [phone]

## 2016-06-19 NOTE — Consult Note (Signed)
Reason for Consult:  Right Knee Pain/Infection  Referring Physician: CHUKWUMA, STRAUS is an 83 y.o. male.  HPI: Randy [name] is a 83 year old gentleman with prior history of right total knee infection 2016 that was initially treated with an antibiotic spacer.  The antibiotic spacer became unstable and the patient  underwent reimplantation again in 2016.  These procedures were performed by Dr. Mayer Camel at [facility]  The patient's index procedure was performed elsewhere. The patient was admitted through [place] [facility] on 06/17/16 due to increased lethargy, mental status changes, and cough. The patient is nonambulatory at baseline and spends most his of his day sitting up in a recliner.  When checking for sources of infections and blood culture did show bacteremia that has grown MSSA and enterococcus.patient has been on both vancomycin and Zosyn.  Past Medical History:  Diagnosis Date  . Anxiety   . Arthritis    R knee,   . CKD (chronic kidney disease)   . Coronary artery disease   . Diabetes mellitus without complication ([place])   . Dysrhythmia    PAF;,sss s/p St. Jude PPM  . Hyperlipidemia   . Hypertension   . MI (myocardial infarction) ([place]) 2014  . Presence of permanent cardiac pacemaker     Past Surgical History:  Procedure Laterality Date  . CHOLECYSTECTOMY    . CORONARY ANGIOPLASTY     mLAD '13, RCA DES 03/2013  . EXCISIONAL TOTAL KNEE ARTHROPLASTY WITH ANTIBIOTIC SPACERS Right 04/20/2014   Procedure: IRRIGATION AND DEBRIDMENT RIGHT TOTAL KNEE REMOVE  ACL IMPLANTED AND PLACE SPACER;  Surgeon: [name], MD;  Location: [facility];  Service: Orthopedics;  Laterality: Right;  . HEMORROIDECTOMY    . KNEE SURGERY  2006  . PACEMAKER INSERTION    . PICC LINE PLACE PERIPHERAL ([place] [facility]X)  04/2014   R upper arm   . TOTAL KNEE ARTHROPLASTY WITH REVISION COMPONENTS Right 06/11/2014   Procedure: TOTAL KNEE ARTHROPLASTY WITH REVISION COMPONENTS REMOVE SPACER PLACE TKA;   Surgeon: [name], MD;  Location: [facility];  Service: Orthopedics;  Laterality: Right;    Family History  Problem Relation Age of Onset  . Heart attack Mother   . Heart attack Father     Social History:  reports that he quit smoking about 33 years ago. He has never used smokeless tobacco. He reports that he does not drink alcohol or use drugs.  Allergies: No Known Allergies  Medications: I have reviewed the patient's current medications.  Results for orders placed or performed during the hospital encounter of 06/17/16 (from the past 48 hour(s))  CBG monitoring, ED     Status: Abnormal   Collection Time: 06/17/16  7:10 PM  Result Value Ref Range   Glucose-Capillary 368 (H) 65 - 99 mg/dL  Blood Culture (routine x 2)     Status: Abnormal   Collection Time: 06/17/16  7:52 PM  Result Value Ref Range   Specimen Description BLOOD BLOOD LEFT FOREARM    Special Requests IN PEDIATRIC BOTTLE Blood Culture adequate volume    Culture  Setup Time      GRAM POSITIVE COCCI IN CLUSTERS IN PEDIATRIC BOTTLE CRITICAL VALUE NOTED.  VALUE IS CONSISTENT WITH PREVIOUSLY REPORTED AND CALLED VALUE.    Culture (A)     STAPHYLOCOCCUS SPECIES (COAGULASE NEGATIVE) THE SIGNIFICANCE OF ISOLATING THIS ORGANISM FROM A SINGLE SET OF BLOOD CULTURES WHEN MULTIPLE SETS ARE DRAWN IS UNCERTAIN. PLEASE NOTIFY THE MICROBIOLOGY DEPARTMENT WITHIN ONE WEEK IF SPECIATION  AND SENSITIVITIES ARE REQUIRED. Performed at [facility], [place] [address], [place], [place] [address]    Report Status 06/19/2016 FINAL   Urinalysis, Routine w reflex microscopic     Status: Abnormal   Collection Time: 06/17/16  7:59 PM  Result Value Ref Range   Color, Urine YELLOW YELLOW   APPearance CLEAR CLEAR   Specific Gravity, Urine 1.020 1.005 - 1.030   pH 5.0 5.0 - 8.0   Glucose, UA >=500 (A) NEGATIVE mg/dL   Hgb urine dipstick SMALL (A) NEGATIVE   Bilirubin Urine NEGATIVE NEGATIVE   Ketones, ur 5 (A) NEGATIVE mg/dL   Protein, ur 30  (A) NEGATIVE mg/dL   Nitrite NEGATIVE NEGATIVE   Leukocytes, UA NEGATIVE NEGATIVE   RBC / HPF 0-5 0 - 5 RBC/hpf   WBC, UA 0-5 0 - 5 WBC/hpf   Bacteria, UA NONE SEEN NONE SEEN   Squamous Epithelial / LPF NONE SEEN NONE SEEN   Mucous PRESENT   Strep pneumoniae urinary antigen     Status: None   Collection Time: 06/17/16  7:59 PM  Result Value Ref Range   Strep Pneumo Urinary Antigen NEGATIVE NEGATIVE    Comment:        Infection due to S. pneumoniae cannot be absolutely ruled out since the antigen present may be below the detection limit of the test. Performed at [facility], [address] [place], [place] [address]   Comprehensive metabolic panel     Status: Abnormal   Collection Time: 06/17/16  8:12 PM  Result Value Ref Range   Sodium 137 135 - 145 mmol/L   Potassium 4.5 3.5 - 5.1 mmol/L   Chloride 104 101 - 111 mmol/L   CO2 22 22 - 32 mmol/L   Glucose, Bld 411 (H) 65 - 99 mg/dL   BUN 21 (H) 6 - 20 mg/dL   Creatinine, Ser 1.87 (H) 0.61 - 1.24 mg/dL   Calcium 8.7 (L) 8.9 - 10.3 mg/dL   Total Protein 7.1 6.5 - 8.1 g/dL   Albumin 3.3 (L) 3.5 - 5.0 g/dL   AST 21 15 - 41 U/L   ALT 8 (L) 17 - 63 U/L   Alkaline Phosphatase 70 38 - 126 U/L   Total Bilirubin 0.6 0.3 - 1.2 mg/dL   GFR calc non Af Amer 30 (L) >60 mL/min   GFR calc Af Amer 35 (L) >60 mL/min    Comment: (NOTE) The eGFR has been calculated using the CKD EPI equation. This calculation has not been validated in all clinical situations. eGFR's persistently <60 mL/min signify possible Chronic Kidney Disease.    Anion gap 11 5 - 15  CBC WITH DIFFERENTIAL     Status: Abnormal   Collection Time: 06/17/16  8:12 PM  Result Value Ref Range   WBC 20.1 (H) 4.0 - 10.5 K/uL   RBC 4.91 4.22 - 5.81 MIL/uL   Hemoglobin 13.9 13.0 - 17.0 g/dL   HCT 42.6 39.0 - 52.0 %   MCV 86.8 78.0 - 100.0 fL   MCH 28.3 26.0 - 34.0 pg   MCHC 32.6 30.0 - 36.0 g/dL   RDW 15.1 11.5 - 15.5 %   Platelets 273 150 - 400 K/uL   Neutrophils  Relative % 79 %   Neutro Abs 15.7 (H) 1.7 - 7.7 K/uL   Lymphocytes Relative 9 %   Lymphs Abs 1.8 0.7 - 4.0 K/uL   Monocytes Relative 12 %   Monocytes Absolute 2.5 (H)  0.1 - 1.0 K/uL   Eosinophils Relative 0 %   Eosinophils Absolute 0.0 0.0 - 0.7 K/uL   Basophils Relative 0 %   Basophils Absolute 0.1 0.0 - 0.1 K/uL  Blood Culture (routine x 2)     Status: Abnormal (Preliminary result)   Collection Time: 06/17/16  8:12 PM  Result Value Ref Range   Specimen Description BLOOD RIGHT ANTECUBITAL    Special Requests      BOTTLES DRAWN AEROBIC AND ANAEROBIC Blood Culture results may not be optimal due to an inadequate volume of blood received in culture bottles   Culture  Setup Time      GRAM POSITIVE COCCI IN CHAINS IN BOTH AEROBIC AND ANAEROBIC BOTTLES CRITICAL RESULT CALLED TO, READ BACK BY AND VERIFIED WITH: N. Glogovac Pharm.D. 15:45 06/18/16 (wilsonm)    Culture (A)     STAPHYLOCOCCUS AUREUS ENTEROCOCCUS SPECIES CULTURE REINCUBATED FOR BETTER GROWTH Performed at [facility], [place] [address], [place], [place] [address]    Report Status PENDING   Blood Culture ID Panel (Reflexed)     Status: Abnormal   Collection Time: 06/17/16  8:12 PM  Result Value Ref Range   Enterococcus species DETECTED (A) NOT DETECTED    Comment: CRITICAL RESULT CALLED TO, READ BACK BY AND VERIFIED WITH: N. Glogovac Pharm.D. 15:45 06/18/16 (wilsonm)    Vancomycin resistance NOT DETECTED NOT DETECTED   Listeria monocytogenes NOT DETECTED NOT DETECTED   Staphylococcus species DETECTED (A) NOT DETECTED    Comment: CRITICAL RESULT CALLED TO, READ BACK BY AND VERIFIED WITH: N. Glogovac Pharm.D. 15:45 06/18/16 (wilsonm)    Staphylococcus aureus DETECTED (A) NOT DETECTED    Comment: Methicillin (oxacillin) susceptible Staphylococcus aureus (MSSA). Preferred therapy is anti staphylococcal beta lactam antibiotic (Cefazolin or Nafcillin), unless clinically contraindicated. CRITICAL RESULT CALLED TO, READ BACK BY  AND VERIFIED WITH: N. Glogovac Pharm.D. 15:45 06/18/16 (wilsonm)    Methicillin resistance NOT DETECTED NOT DETECTED   Streptococcus species NOT DETECTED NOT DETECTED   Streptococcus agalactiae NOT DETECTED NOT DETECTED   Streptococcus pneumoniae NOT DETECTED NOT DETECTED   Streptococcus pyogenes NOT DETECTED NOT DETECTED   Acinetobacter baumannii NOT DETECTED NOT DETECTED   Enterobacteriaceae species NOT DETECTED NOT DETECTED   Enterobacter cloacae complex NOT DETECTED NOT DETECTED   Escherichia coli NOT DETECTED NOT DETECTED   Klebsiella oxytoca NOT DETECTED NOT DETECTED   Klebsiella pneumoniae NOT DETECTED NOT DETECTED   Proteus species NOT DETECTED NOT DETECTED   Serratia marcescens NOT DETECTED NOT DETECTED   Haemophilus influenzae NOT DETECTED NOT DETECTED   Neisseria meningitidis NOT DETECTED NOT DETECTED   Pseudomonas aeruginosa NOT DETECTED NOT DETECTED   Candida albicans NOT DETECTED NOT DETECTED   Candida glabrata NOT DETECTED NOT DETECTED   Candida krusei NOT DETECTED NOT DETECTED   Candida parapsilosis NOT DETECTED NOT DETECTED   Candida tropicalis NOT DETECTED NOT DETECTED    Comment: Performed at [facility], [address], [place], [place] [address]  I-Stat CG4 Lactic Acid, ED  (not at  [facility])     Status: Abnormal   Collection Time: 06/17/16  8:28 PM  Result Value Ref Range   Lactic Acid, Venous 4.42 (HH) 0.5 - 1.9 mmol/L   Comment NOTIFIED PHYSICIAN   Influenza panel by PCR (type A & B)     Status: None   Collection Time: 06/17/16  9:17 PM  Result Value Ref Range   Influenza A By PCR NEGATIVE NEGATIVE   Influenza B By  PCR NEGATIVE NEGATIVE    Comment: (NOTE) The Xpert Xpress Flu assay is intended as an aid in the diagnosis of  influenza and should not be used as a sole basis for treatment.  This  assay is FDA approved for nasopharyngeal swab specimens only. Nasal  washings and aspirates are unacceptable for Xpert Xpress Flu testing.   I-Stat CG4 Lactic  Acid, ED  (not at  [facility])     Status: Abnormal   Collection Time: 06/17/16 11:53 PM  Result Value Ref Range   Lactic Acid, Venous 2.95 (HH) 0.5 - 1.9 mmol/L   Comment NOTIFIED PHYSICIAN   Lactic acid, plasma     Status: Abnormal   Collection Time: 06/18/16 12:42 AM  Result Value Ref Range   Lactic Acid, Venous 5.4 (HH) 0.5 - 1.9 mmol/L    Comment: CRITICAL RESULT CALLED TO, READ BACK BY AND VERIFIED WITH: C.DENNY,RN 0142 06/18/16 W.SHEA   Procalcitonin     Status: None   Collection Time: 06/18/16 12:42 AM  Result Value Ref Range   Procalcitonin 1.94 ng/mL    Comment:        Interpretation: PCT > 0.5 ng/mL and <= 2 ng/mL: Systemic infection (sepsis) is possible, but other conditions are known to elevate PCT as well. (NOTE)         ICU PCT Algorithm               Non ICU PCT Algorithm    ----------------------------     ------------------------------         PCT < 0.25 ng/mL                 PCT < 0.1 ng/mL     Stopping of antibiotics            Stopping of antibiotics       strongly encouraged.               strongly encouraged.    ----------------------------     ------------------------------       PCT level decrease by               PCT < 0.25 ng/mL       >= 80% from peak PCT       OR PCT 0.25 - 0.5 ng/mL          Stopping of antibiotics                                             encouraged.     Stopping of antibiotics           encouraged.    ----------------------------     ------------------------------       PCT level decrease by              PCT >= 0.25 ng/mL       < 80% from peak PCT        AND PCT >= 0.5 ng/mL             Continuing antibiotics                                              encouraged.       Continuing antibiotics  encouraged.    ----------------------------     ------------------------------     PCT level increase compared          PCT > 0.5 ng/mL         with peak PCT AND          PCT >= 0.5 ng/mL             Escalation of antibiotics                                           strongly encouraged.      Escalation of antibiotics        strongly encouraged.   Protime-INR     Status: Abnormal   Collection Time: 06/18/16 12:42 AM  Result Value Ref Range   Prothrombin Time 16.1 (H) 11.4 - 15.2 seconds   INR 1.29   APTT     Status: None   Collection Time: 06/18/16 12:42 AM  Result Value Ref Range   aPTT 34 24 - 36 seconds  MRSA PCR Screening     Status: Abnormal   Collection Time: 06/18/16  1:40 AM  Result Value Ref Range   MRSA by PCR POSITIVE (A) NEGATIVE    Comment:        The GeneXpert MRSA Assay (FDA approved for NASAL specimens only), is one component of a comprehensive MRSA colonization surveillance program. It is not intended to diagnose MRSA infection nor to guide or monitor treatment for MRSA infections. RESULT CALLED TO, READ BACK BY AND VERIFIED WITH: C.SMITH,RN [id number] 06/18/16 W.SHEA   Glucose, capillary     Status: Abnormal   Collection Time: 06/18/16  2:28 AM  Result Value Ref Range   Glucose-Capillary 352 (H) 65 - 99 mg/dL  Culture, respiratory (NON-Expectorated)     Status: None (Preliminary result)   Collection Time: 06/18/16  6:32 AM  Result Value Ref Range   Specimen Description TRACHEAL ASPIRATE    Special Requests NONE    Gram Stain      ABUNDANT WBC PRESENT, PREDOMINANTLY PMN RARE SQUAMOUS EPITHELIAL CELLS PRESENT MODERATE GRAM POSITIVE RODS GRAM POSITIVE COCCI IN PAIRS    Culture      CULTURE REINCUBATED FOR BETTER GROWTH Performed at [facility], [address], [place], [place] [address]    Report Status PENDING   CBC     Status: Abnormal   Collection Time: 06/18/16  8:12 AM  Result Value Ref Range   WBC 18.7 (H) 4.0 - 10.5 K/uL   RBC 4.52 4.22 - 5.81 MIL/uL   Hemoglobin 12.7 (L) 13.0 - 17.0 g/dL   HCT 70.9 (L) 62.8 - 36.6 %   MCV 85.6 78.0 - 100.0 fL   MCH 28.1 26.0 - 34.0 pg   MCHC 32.8 30.0 - 36.0 g/dL   RDW 29.4 76.5 - 46.5 %   Platelets 249 150 - 400 K/uL  Basic  metabolic panel     Status: Abnormal   Collection Time: 06/18/16  8:12 AM  Result Value Ref Range   Sodium 140 135 - 145 mmol/L   Potassium 4.0 3.5 - 5.1 mmol/L   Chloride 109 101 - 111 mmol/L   CO2 21 (L) 22 - 32 mmol/L   Glucose, Bld 302 (H) 65 - 99 mg/dL   BUN 20 6 - 20 mg/dL   Creatinine, Ser 0.35 (H) 0.61 - 1.24  mg/dL   Calcium 8.0 (L) 8.9 - 10.3 mg/dL   GFR calc non Af Amer 36 (L) >60 mL/min   GFR calc Af Amer 42 (L) >60 mL/min    Comment: (NOTE) The eGFR has been calculated using the CKD EPI equation. This calculation has not been validated in all clinical situations. eGFR's persistently <60 mL/min signify possible Chronic Kidney Disease.    Anion gap 10 5 - 15  Lactic acid, plasma     Status: Abnormal   Collection Time: 06/18/16  8:22 AM  Result Value Ref Range   Lactic Acid, Venous 2.2 (HH) 0.5 - 1.9 mmol/L    Comment: CRITICAL RESULT CALLED TO, READ BACK BY AND VERIFIED WITH: D.ALDRIDGE RN [id number] A.QUIZON   Glucose, capillary     Status: Abnormal   Collection Time: 06/18/16  8:41 AM  Result Value Ref Range   Glucose-Capillary 286 (H) 65 - 99 mg/dL   Comment 1 Notify RN    Comment 2 Document in Chart   Glucose, capillary     Status: Abnormal   Collection Time: 06/18/16 12:21 PM  Result Value Ref Range   Glucose-Capillary 165 (H) 65 - 99 mg/dL   Comment 1 Notify RN    Comment 2 Document in Chart   Glucose, capillary     Status: Abnormal   Collection Time: 06/18/16  4:52 PM  Result Value Ref Range   Glucose-Capillary 138 (H) 65 - 99 mg/dL   Comment 1 Notify RN    Comment 2 Document in Chart   Glucose, capillary     Status: Abnormal   Collection Time: 06/18/16 10:47 PM  Result Value Ref Range   Glucose-Capillary 207 (H) 65 - 99 mg/dL  CBC     Status: Abnormal   Collection Time: 06/19/16  3:34 AM  Result Value Ref Range   WBC 20.6 (H) 4.0 - 10.5 K/uL   RBC 4.63 4.22 - 5.81 MIL/uL   Hemoglobin 13.2 13.0 - 17.0 g/dL   HCT 39.8 39.0 - 52.0 %   MCV 86.0  78.0 - 100.0 fL   MCH 28.5 26.0 - 34.0 pg   MCHC 33.2 30.0 - 36.0 g/dL   RDW 15.4 11.5 - 15.5 %   Platelets 268 150 - 400 K/uL  Basic metabolic panel     Status: Abnormal   Collection Time: 06/19/16  3:34 AM  Result Value Ref Range   Sodium 141 135 - 145 mmol/L   Potassium 3.6 3.5 - 5.1 mmol/L   Chloride 110 101 - 111 mmol/L   CO2 22 22 - 32 mmol/L   Glucose, Bld 209 (H) 65 - 99 mg/dL   BUN 20 6 - 20 mg/dL   Creatinine, Ser 1.71 (H) 0.61 - 1.24 mg/dL   Calcium 8.1 (L) 8.9 - 10.3 mg/dL   GFR calc non Af Amer 33 (L) >60 mL/min   GFR calc Af Amer 39 (L) >60 mL/min    Comment: (NOTE) The eGFR has been calculated using the CKD EPI equation. This calculation has not been validated in all clinical situations. eGFR's persistently <60 mL/min signify possible Chronic Kidney Disease.    Anion gap 9 5 - 15  Glucose, capillary     Status: Abnormal   Collection Time: 06/19/16  7:37 AM  Result Value Ref Range   Glucose-Capillary 201 (H) 65 - 99 mg/dL   Comment 1 Notify RN    Comment 2 Document in Chart     Dg Chest 2 View  Result Date: 06/17/2016 CLINICAL DATA:  Lethargy and cough EXAM: CHEST  2 VIEW COMPARISON:  04/23/2014 FINDINGS: Cardiac shadow is stable. Pacing device is again noted. The lungs are well aerated bilaterally. No bony abnormality is noted. IMPRESSION: No active cardiopulmonary disease. Electronically Signed   By: Inez Catalina M.D.   On: 06/17/2016 20:38   Ct Chest Wo Contrast  Result Date: 06/18/2016 CLINICAL DATA:  Lethargy and cough for 2 days EXAM: CT CHEST WITHOUT CONTRAST TECHNIQUE: Multidetector CT imaging of the chest was performed following the standard protocol without IV contrast. COMPARISON:  06/17/2016, CT 04/17/2016 FINDINGS: Cardiovascular: Limited evaluation without intravenous contrast. Aortic atherosclerosis. No aneurysmal dilatation. Coronary artery calcification. Partially visualized cardiac pacing leads. Mild cardiomegaly. No large pericardial effusion.  Mediastinum/Nodes: Midline trachea. Few prominent sub- carinal and periesophageal lymph nodes, measuring up to 13 mm. Esophagus within normal limits. Esophagus Lungs/Pleura: Mild subpleural fibrosis. No focal consolidation, pleural effusion, or pneumothorax. Upper Abdomen: No acute abnormality. 9.6 cm cyst in the mid to upper right kidney. Vague hypodensity in the upper pole of the left kidney also consistent with a cyst Musculoskeletal: Degenerative changes of the spine. No acute or suspicious bone lesion. IMPRESSION: 1. Mild subpleural fibrosis. No acute pulmonary infiltrates are visualized. 2. Mild nonspecific mediastinal adenopathy 3. Cardiomegaly 4. Bilateral kidney cysts, measuring up to 9.6 cm on the right. Electronically Signed   By: Donavan Foil M.D.   On: 06/18/2016 00:26    Review of Systems  Constitutional: Negative for fever.  Musculoskeletal: Positive for joint pain.   Blood pressure (!) 154/46, pulse 70, temperature 99 F (37.2 C), temperature source Oral, resp. rate (!) 24, height '5\' 11"'$  (1.803 m), weight 90.1 kg (198 lb 10.2 oz), SpO2 96 %. Physical Exam  Constitutional: He appears well-developed and well-nourished.  HENT:  Head: Normocephalic and atraumatic.  Neck: Normal range of motion. Neck supple.  Musculoskeletal: He exhibits edema and tenderness.  Patient's right knee was irritable to range of motion.  He does have a mild effusion. Calves are soft and nontender.  The patient is knee was aspirated under sterile technique utilizing a 18-gauge needle and 60 cc syringe.. We obtained approximately 5 cc of frank pus from the right knee joint.  Neurological: He is alert.  Skin: Skin is warm and dry.  Psychiatric: He has a normal mood and affect. His behavior is normal.    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  With the history of prior infection and placement of antiobiotic spacer and reimplantation that has now once again become infected it is our recommendation to do an above the knee amputation.  The patient is nonambulatory and this would definitively take care of any chances of reinfection.we have attempted to contact the patient's son who has power of attorney.  We have contacted the patient's daughter who also instructed [place] to contact the son. We are awaiting his response.  He will need to be medically stable from a medicine standpoint prior to any surgery this may take several days due to his concomitant medical issues.  We appreciate infectious diseases recommendations on antibiotic treatment.   Randy [name] R 06/19/2016, 12:40 PM

## 2016-06-19 NOTE — Progress Notes (Signed)
Modified Barium Swallow Progress Note  Patient Details  Name: Randy [name] MRN: [id number] Date of Birth: [birth year]  Today's Date: 06/19/2016  Modified Barium Swallow completed.  Full report located under Chart Review in the Imaging Section.  Brief recommendations include the following:  Clinical Impression  Pt presents with severe pharyngeal dysphagia - narrow pharynx and decreased tongue base retraction results in very poor epiglottic deflection and gross pharyngeal/vallecular residuals.  Pt requires multiple swallows (up to 5-6) with each bolus and still has vallecular residuals. He did not aspirate or penetrate surprisingly.  Residuals were much worse with solids/pudding than liquids.  Various strategies/postures including head turn left, multiple swallows, following solid with liquids mitigate dysphagia.  Pt did not fully clear vallecular space but can fortunately hock and expectorate to clear.  Pt reports his swallow ability during MBS is "normal" for him causing SLP to suspect baseline significant dysphagia that he has been managing.    Recommend strict precautions to mitigate aspiration risk and modify diet to full liquids. Using teach back, pt able to demonstrate multiple swallows, "hocking" and oral suctioning independently.  Will follow for po tolerance, readiness for dietary advancement.     Swallow Evaluation Recommendations       SLP Diet Recommendations: Nectar thick liquid;Thin liquid   Liquid Administration via: Cup;Straw   Medication Administration:  (crushed with puree/liquid)   Supervision: Full assist for feeding   Compensations: Slow rate;Small sips/bites;Follow solids with liquid;Multiple dry swallows after each bite/sip ("hock" and expectorate after multiple swallows with EACH BOLUS)   Postural Changes: Seated upright at 90 degrees;Remain semi-upright after after feeds/meals (Comment)   Oral Care Recommendations: Oral care before and after [name], [name] SLP [phone]

## 2016-06-19 NOTE — Progress Notes (Addendum)
PROGRESS NOTE    Randy [name]  DTO:[id number] DOB: Nov 19, 1924 DOA: 06/17/2016 PCP: Pcp Not In System   Chief Complaint  Patient presents with  . Altered Mental Status  . Hyperglycemia  . Fever    Brief Narrative:  HPI on 06/17/2016 by Dr. Lily Kocher Randy [name] is a 83 y.o. gentleman with a history of mild dementia/cognitive impairment, CAD with prior stent placement, history of atrial fibrillation S/P PPM implant (CHADS-Vasc score of at least four, no anticoagulation), HTN, HLD, DM, and CKD 3 with baseline creatinine around 1.3-1.5 who was referred to the ED via EMS by one of his caregivers for evaluation of increased lethargy, mental status changes, and cough.  The patient is nonambulatory at baseline and spends most of his day sitting up a recliner.  He has caregiver support 24 hours/day.  He has had cough for 4-5 days.  He has had two known sick contacts (son-in-law and caregiver).  He has had yellow sputum.  No chest pain or shortness of breath.  No nausea or vomiting.  No abdominal pain.  No dysuria.  No diarrhea. Today, he was noted to have increased lethargy and muffled speech with slurring.  No other focal weakenss.  911 was called and the patient was transferred to the ED for evaluation. No recent hospitalizations.  No recent antibiotics.  He has been using OTC mucinex for his symptoms. No overt signs or symptoms of aspiration.  Interim history 1/2 blood cultures +enterococcus and MSSA. ID consulted. TEE planned for 06/22/2016. Currently on vancomycin.  Assessment & Plan   Sepsis secondary to MSSA/Enterococcus bacteremia -Suspected respiratory source vs groin cellulitis vs bacteremia -Presented with fever, tachycardia, tachypnea, leukocytosis -Patient also had elevated pro calcitonin level as well as lactic acid -Initially placed on  vancomycin and zosyn, zosyn discontinued on 06/18/16 -Continue zosyn -Chest x-ray showed no cardiopulmonary process -CT chest showed subpleural  fibrosis, no acute pulmonary infiltrates. -Influenza PCR negative -Strep pneumonia urine antigen negative -UA unremarkable for infection -Blood cultures 06/17/2016: MSSA, enterococcus  -Infectious disease consulted and appreciated, recommended TEE and EP consult -Cardiology consulted and appreciated, plan for TEE on 06/22/2016 -Will repeat blood cultures -Will consult orthopedics, Dr. Mayer Camel, as patient does have RTKA (2016) and has had infection before  Essential hypertension -Currently on no home medications -Continue to monitor, will add on medications as needed  Diabetes mellitus, type II -Currently on no home medications -Continue insulin sliding scale, Levemir, CBG monitoring  History of atrial fibrillation -CHADSVASC 4 -Currently on no anticoagulation -Status post pacemaker placement- will ask EP to consult as patient does have bacteremia  Chronic kidney disease, stage III -Chronic appears to be close to baseline at 1.71 -Continue to monitor BMP  Candidiasis  -Groin area appears to be erythematous and flaky -Diflucan ordered  Stage II pressure injury -moisture related -wound care consulted and appreciated  DVT Prophylaxis  lovenox  Code Status: Full  Family Communication: None at bedside  Disposition Plan: Admitted. Continue to monitor in stepdown.  Consultants Infectious disease Cardiology Orthopedics  Procedures  None  Antibiotics   Anti-infectives    Start     Dose/Rate Route Frequency Ordered Stop   06/19/16 2100  nafcillin 2 g in dextrose 5 % 100 mL IVPB     2 g 200 mL/hr over 30 Minutes Intravenous Every 4 hours 06/18/16 2032     06/18/16 2200  vancomycin (VANCOCIN) IVPB 1000 mg/200 mL premix     1,000 mg 200 mL/hr over 60  Minutes Intravenous Every 24 hours 06/18/16 1702     06/18/16 2100  vancomycin (VANCOCIN) IVPB 750 mg/150 ml premix  Status:  Discontinued     750 mg 150 mL/hr over 60 Minutes Intravenous Every 24 hours 06/17/16 2103 06/17/16  2103   06/18/16 2100  vancomycin (VANCOCIN) IVPB 1000 mg/200 mL premix  Status:  Discontinued     1,000 mg 200 mL/hr over 60 Minutes Intravenous Every 24 hours 06/17/16 2103 06/18/16 1638   06/18/16 2100  nafcillin IVPB 2 g  Status:  Discontinued     2 g 200 mL/hr over 30 Minutes Intravenous Every 4 hours 06/18/16 2025 06/18/16 2026   06/18/16 2100  nafcillin 2 g in dextrose 5 % 50 mL IVPB     2 g 100 mL/hr over 30 Minutes Intravenous Every 4 hours 06/18/16 2027 06/19/16 2059   06/18/16 1700  nafcillin injection 2 g  Status:  Discontinued     2 g Intravenous Every 4 hours 06/18/16 1641 06/18/16 1650   06/18/16 1700  nafcillin 2 g in dextrose 5 % 100 mL IVPB  Status:  Discontinued     2 g 200 mL/hr over 30 Minutes Intravenous Every 4 hours 06/18/16 1650 06/18/16 2032   06/18/16 1600  fluconazole (DIFLUCAN) tablet 100 mg     100 mg Oral Daily 06/18/16 1516     06/18/16 0400  piperacillin-tazobactam (ZOSYN) IVPB 3.375 g  Status:  Discontinued     3.375 g 12.5 mL/hr over 240 Minutes Intravenous Every 8 hours 06/17/16 2103 06/18/16 1556   06/17/16 2045  piperacillin-tazobactam (ZOSYN) IVPB 3.375 g     3.375 g 100 mL/hr over 30 Minutes Intravenous  Once 06/17/16 2032 06/17/16 2152   06/17/16 2045  vancomycin (VANCOCIN) IVPB 1000 mg/200 mL premix     1,000 mg 200 mL/hr over 60 Minutes Intravenous  Once 06/17/16 2032 06/17/16 2334      Subjective:   Randy [name] seen and examined today.  Patient states he currently feels tired and cannot remember what happened yesterday.  Denies chest pain, shortness of breath, abdominal pain, nausea, vomiting, diarrhea, constipation.   Objective:   Vitals:   06/19/16 0400 06/19/16 0458 06/19/16 0600 06/19/16 0800  BP: (!) 147/55  (!) 113/49 (!) 154/46  Pulse: 78   70  Resp: (!) 32 (!) 28 20 (!) 24  Temp:  98 F (36.7 C)  97.7 F (36.5 C)  TempSrc:  Oral  Oral  SpO2: 96%   96%  Weight:      Height:        Intake/Output Summary (Last 24  hours) at 06/19/16 1025 Last data filed at 06/19/16 0900  Gross per 24 hour  Intake             3100 ml  Output              500 ml  Net             2600 ml   Filed Weights   06/17/16 1931 06/18/16 0056  Weight: 81.6 kg (180 lb) 90.1 kg (198 lb 10.2 oz)    Exam  General: Well developed, well nourished, no apparent distress  HEENT: NCAT, mucous membranes moist, poor dentition.   Cardiovascular: S1 S2 auscultated, RRR, 2/6 SEM  Respiratory: Diminished breath sounds.   Abdomen: Soft, nontender, nondistended, + bowel sounds  Extremities: warm dry without cyanosis clubbing or edema  Neuro: AAOx2 (self and time), nonfocal  Skin: Groin erythema, flaky skin (  likely fungal infection)  Psych: Appropriate mood and affect   Data Reviewed: I have personally reviewed following labs and imaging studies  CBC:  Recent Labs Lab 06/17/16 2012 06/18/16 0812 06/19/16 0334  WBC 20.1* 18.7* 20.6*  NEUTROABS 15.7*  --   --   HGB 13.9 12.7* 13.2  HCT 42.6 38.7* 39.8  MCV 86.8 85.6 86.0  PLT 273 249 161   Basic Metabolic Panel:  Recent Labs Lab 06/17/16 2012 06/18/16 0812 06/19/16 0334  NA 137 140 141  K 4.5 4.0 3.6  CL 104 109 110  CO2 22 21* 22  GLUCOSE 411* 302* 209*  BUN 21* 20 20  CREATININE 1.87* 1.61* 1.71*  CALCIUM 8.7* 8.0* 8.1*   GFR: Estimated Creatinine Clearance: 30.6 mL/min (A) (by C-G formula based on SCr of 1.71 mg/dL (H)). Liver Function Tests:  Recent Labs Lab 06/17/16 2012  AST 21  ALT 8*  ALKPHOS 70  BILITOT 0.6  PROT 7.1  ALBUMIN 3.3*   No results for input(s): LIPASE, AMYLASE in the last 168 hours. No results for input(s): AMMONIA in the last 168 hours. Coagulation Profile:  Recent Labs Lab 06/18/16 0042  INR 1.29   Cardiac Enzymes: No results for input(s): CKTOTAL, CKMB, CKMBINDEX, TROPONINI in the last 168 hours. BNP (last 3 results) No results for input(s): PROBNP in the last 8760 hours. HbA1C: No results for input(s): HGBA1C  in the last 72 hours. CBG:  Recent Labs Lab 06/18/16 0841 06/18/16 1221 06/18/16 1652 06/18/16 2247 06/19/16 0737  GLUCAP 286* 165* 138* 207* 201*   Lipid Profile: No results for input(s): CHOL, HDL, LDLCALC, TRIG, CHOLHDL, LDLDIRECT in the last 72 hours. Thyroid Function Tests: No results for input(s): TSH, T4TOTAL, FREET4, T3FREE, THYROIDAB in the last 72 hours. Anemia Panel: No results for input(s): VITAMINB12, FOLATE, FERRITIN, TIBC, IRON, RETICCTPCT in the last 72 hours. Urine analysis:    Component Value Date/Time   COLORURINE YELLOW 06/17/2016 1959   APPEARANCEUR CLEAR 06/17/2016 1959   LABSPEC 1.020 06/17/2016 1959   PHURINE 5.0 06/17/2016 1959   GLUCOSEU >=500 (A) 06/17/2016 1959   HGBUR SMALL (A) 06/17/2016 1959   BILIRUBINUR NEGATIVE 06/17/2016 1959   KETONESUR 5 (A) 06/17/2016 1959   PROTEINUR 30 (A) 06/17/2016 1959   UROBILINOGEN 0.2 05/18/2014 1634   NITRITE NEGATIVE 06/17/2016 1959   LEUKOCYTESUR NEGATIVE 06/17/2016 1959   Sepsis Labs: @LABRCNTIP (procalcitonin:4,lacticidven:4)  ) Recent Results (from the past 240 hour(s))  Blood Culture (routine x 2)     Status: None (Preliminary result)   Collection Time: 06/17/16  7:52 PM  Result Value Ref Range Status   Specimen Description BLOOD BLOOD LEFT FOREARM  Final   Special Requests IN PEDIATRIC BOTTLE Blood Culture adequate volume  Final   Culture  Setup Time   Final    GRAM POSITIVE COCCI IN CLUSTERS IN PEDIATRIC BOTTLE CRITICAL VALUE NOTED.  VALUE IS CONSISTENT WITH PREVIOUSLY REPORTED AND CALLED VALUE.    Culture   Final    GRAM POSITIVE COCCI IDENTIFICATION TO FOLLOW Performed at [facility], [place] [address] [place], [place] [address]    Report Status PENDING  Incomplete  Blood Culture (routine x 2)     Status: Abnormal (Preliminary result)   Collection Time: 06/17/16  8:12 PM  Result Value Ref Range Status   Specimen Description BLOOD RIGHT ANTECUBITAL  Final   Special Requests   Final      BOTTLES DRAWN AEROBIC AND ANAEROBIC Blood Culture results may not be  optimal due to an inadequate volume of blood received in culture bottles   Culture  Setup Time   Final    GRAM POSITIVE COCCI IN CHAINS IN BOTH AEROBIC AND ANAEROBIC BOTTLES CRITICAL RESULT CALLED TO, READ BACK BY AND VERIFIED WITH: N. Glogovac Pharm.D. 15:45 06/18/16 ([email or web address])    Culture (A)  Final    STAPHYLOCOCCUS AUREUS ENTEROCOCCUS SPECIES CULTURE REINCUBATED FOR BETTER GROWTH Performed at [facility], [place] [address] [place], [place] [address]    Report Status PENDING  Incomplete  Blood Culture ID Panel (Reflexed)     Status: Abnormal   Collection Time: 06/17/16  8:12 PM  Result Value Ref Range Status   Enterococcus species DETECTED (A) NOT DETECTED Final    Comment: CRITICAL RESULT CALLED TO, READ BACK BY AND VERIFIED WITH: N. Glogovac Pharm.D. 15:45 06/18/16 ([email or web address])    Vancomycin resistance NOT DETECTED NOT DETECTED Final   Listeria monocytogenes NOT DETECTED NOT DETECTED Final   Staphylococcus species DETECTED (A) NOT DETECTED Final    Comment: CRITICAL RESULT CALLED TO, READ BACK BY AND VERIFIED WITH: N. Glogovac Pharm.D. 15:45 06/18/16 ([email or web address])    Staphylococcus aureus DETECTED (A) NOT DETECTED Final    Comment: Methicillin (oxacillin) susceptible Staphylococcus aureus (MSSA). Preferred therapy is anti staphylococcal beta lactam antibiotic (Cefazolin or Nafcillin), unless clinically contraindicated. CRITICAL RESULT CALLED TO, READ BACK BY AND VERIFIED WITH: N. Glogovac Pharm.D. 15:45 06/18/16 ([email or web address])    Methicillin resistance NOT DETECTED NOT DETECTED Final   Streptococcus species NOT DETECTED NOT DETECTED Final   Streptococcus agalactiae NOT DETECTED NOT DETECTED Final   Streptococcus pneumoniae NOT DETECTED NOT DETECTED Final   Streptococcus pyogenes NOT DETECTED NOT DETECTED Final   Acinetobacter baumannii NOT DETECTED NOT DETECTED Final   Enterobacteriaceae species NOT DETECTED NOT  DETECTED Final   Enterobacter cloacae complex NOT DETECTED NOT DETECTED Final   Escherichia coli NOT DETECTED NOT DETECTED Final   Klebsiella oxytoca NOT DETECTED NOT DETECTED Final   Klebsiella pneumoniae NOT DETECTED NOT DETECTED Final   Proteus species NOT DETECTED NOT DETECTED Final   Serratia marcescens NOT DETECTED NOT DETECTED Final   Haemophilus influenzae NOT DETECTED NOT DETECTED Final   Neisseria meningitidis NOT DETECTED NOT DETECTED Final   Pseudomonas aeruginosa NOT DETECTED NOT DETECTED Final   Candida albicans NOT DETECTED NOT DETECTED Final   Candida glabrata NOT DETECTED NOT DETECTED Final   Candida krusei NOT DETECTED NOT DETECTED Final   Candida parapsilosis NOT DETECTED NOT DETECTED Final   Candida tropicalis NOT DETECTED NOT DETECTED Final    Comment: Performed at [facility], [address] [place], [place] [address]  MRSA PCR Screening     Status: Abnormal   Collection Time: 06/18/16  1:40 AM  Result Value Ref Range Status   MRSA by PCR POSITIVE (A) NEGATIVE Final    Comment:        The GeneXpert MRSA Assay (FDA approved for NASAL specimens only), is one component of a comprehensive MRSA colonization surveillance program. It is not intended to diagnose MRSA infection nor to guide or monitor treatment for MRSA infections. RESULT CALLED TO, READ BACK BY AND VERIFIED WITH: C.SMITH,RN [id number] 06/18/16 W.SHEA   Culture, respiratory (NON-Expectorated)     Status: None (Preliminary result)   Collection Time: 06/18/16  6:32 AM  Result Value Ref Range Status   Specimen Description TRACHEAL ASPIRATE  Final   Special Requests NONE  Final   Gram Stain   Final    ABUNDANT  WBC PRESENT, PREDOMINANTLY PMN RARE SQUAMOUS EPITHELIAL CELLS PRESENT MODERATE GRAM POSITIVE RODS GRAM POSITIVE COCCI IN PAIRS    Culture   Final    CULTURE REINCUBATED FOR BETTER GROWTH Performed at [facility], [name] [address], [place], [place] [address]    Report Status PENDING   Incomplete      Radiology Studies: Dg Chest 2 View  Result Date: 06/17/2016 CLINICAL DATA:  Lethargy and cough EXAM: CHEST  2 VIEW COMPARISON:  04/23/2014 FINDINGS: Cardiac shadow is stable. Pacing device is again noted. The lungs are well aerated bilaterally. No bony abnormality is noted. IMPRESSION: No active cardiopulmonary disease. Electronically Signed   By: Inez Catalina M.D.   On: 06/17/2016 20:38   Ct Chest Wo Contrast  Result Date: 06/18/2016 CLINICAL DATA:  Lethargy and cough for 2 days EXAM: CT CHEST WITHOUT CONTRAST TECHNIQUE: Multidetector CT imaging of the chest was performed following the standard protocol without IV contrast. COMPARISON:  06/17/2016, CT 04/17/2016 FINDINGS: Cardiovascular: Limited evaluation without intravenous contrast. Aortic atherosclerosis. No aneurysmal dilatation. Coronary artery calcification. Partially visualized cardiac pacing leads. Mild cardiomegaly. No large pericardial effusion. Mediastinum/Nodes: Midline trachea. Few prominent sub- carinal and periesophageal lymph nodes, measuring up to 13 mm. Esophagus within normal limits. Esophagus Lungs/Pleura: Mild subpleural fibrosis. No focal consolidation, pleural effusion, or pneumothorax. Upper Abdomen: No acute abnormality. 9.6 cm cyst in the mid to upper right kidney. Vague hypodensity in the upper pole of the left kidney also consistent with a cyst Musculoskeletal: Degenerative changes of the spine. No acute or suspicious bone lesion. IMPRESSION: 1. Mild subpleural fibrosis. No acute pulmonary infiltrates are visualized. 2. Mild nonspecific mediastinal adenopathy 3. Cardiomegaly 4. Bilateral kidney cysts, measuring up to 9.6 cm on the right. Electronically Signed   By: Donavan Foil M.D.   On: 06/18/2016 00:26     Scheduled Meds: . chlorhexidine  15 mL Mouth Rinse BID  . Chlorhexidine Gluconate Cloth  6 each Topical Q0600  . enoxaparin (LOVENOX) injection  30 mg Subcutaneous Q24H  . feeding supplement  (ENSURE ENLIVE)  237 mL Oral BID BM  . fluconazole  100 mg Oral Daily  . insulin aspart  0-20 Units Subcutaneous TID WC  . insulin aspart  0-5 Units Subcutaneous QHS  . insulin detemir  5 Units Subcutaneous BID  . mouth rinse  15 mL Mouth Rinse q12n4p  . mupirocin ointment  1 application Nasal BID  . nystatin   Topical TID   Continuous Infusions: . sodium chloride 100 mL/hr at 06/19/16 0900  . nafcillin IV    . nafcillin 2 GM IVPB 2 g (06/19/16 0802)  . vancomycin Stopped (06/18/16 2307)     LOS: 2 days   Time Spent in minutes   30 minutes  Raed Schalk D.O. on 06/19/2016 at 10:25 AM  Between 7am to 7pm - Pager - [phone]  After 7pm go to [email or web address] - password [email or web address]  And look for the night coverage person covering for me after hours  [facility] Office  [phone]

## 2016-06-19 NOTE — Progress Notes (Signed)
NUTRITION NOTE  Spoke with SLP who reports that pt likely with chronic dysphagia. This RD had ordered Ensure Enlive BID yesterday after discussion with RN. Will d/c Ensure and will order Magic Cup BID with lunch and dinner trays, each supplement provides 290 kcal and 9 grams of protein.  Will continue to follow per protocol.   Jarome Matin, MS, RD, LDN, [facility] Inpatient Clinical Dietitian Pager # [phone] After hours/weekend pager # [phone]

## 2016-06-19 NOTE — Consult Note (Signed)
Date of Admission:  06/17/2016  Date of Consult:  06/19/2016  Reason for Consult: MSSA and Enterococcal bacteremia, PM infection, sepsis and septic Prosthetic lef tknee Referring Physician: "Terrilyn Saver auto consult" and Dr. Ree Kida   HPI: Randy [name] is an 83 y.o. 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 Mayer Camel with my partner Dr. Baxter Flattery, w several stage revision including antibiotic spacer, hx of pacemaker (currently being paced) mild dementia, CAD. Who was admitted with confusion and found to be septic. He has since had + blood cultures with BCID ID staphylococccus aureus (MS) and Enterococcus from one blood culture and other blood culture growing a COag negative staphylococcus.   I narrowed him to nafcillin and vancomycin to cover for MSSA and AMP R enterococcus pending S testing .THis am his left knee was quite warm and tender to palpation.   Orthopedics have since seen him and aspirated frank pus from his knee.     Past Medical History:  Diagnosis Date  . Anxiety   . Arthritis    R knee,   . CKD (chronic kidney disease)   . Coronary artery disease   . Diabetes mellitus without complication ([facility])   . Dysrhythmia    PAF;,sss s/p St[employer] Jude PPM  . Hyperlipidemia   . Hypertension   . MI (myocardial infarction) ([place]) 2014  . Presence of permanent cardiac pacemaker     Past Surgical History:  Procedure Laterality Date  . CHOLECYSTECTOMY    . CORONARY ANGIOPLASTY     mLAD '13, RCA DES 03/2013  . EXCISIONAL TOTAL KNEE ARTHROPLASTY WITH ANTIBIOTIC SPACERS Right 04/20/2014   Procedure: IRRIGATION AND DEBRIDMENT RIGHT TOTAL KNEE REMOVE  ACL IMPLANTED AND PLACE SPACER;  Surgeon: [name], MD;  Location: [facility];  Service: Orthopedics;  Laterality: Right;  . HEMORROIDECTOMY    . KNEE SURGERY  2006  . PACEMAKER INSERTION    . PICC LINE PLACE PERIPHERAL ([facility])  04/2014   R upper arm   . TOTAL KNEE  ARTHROPLASTY WITH REVISION COMPONENTS Right 06/11/2014   Procedure: TOTAL KNEE ARTHROPLASTY WITH REVISION COMPONENTS REMOVE SPACER PLACE TKA;  Surgeon: [name], MD;  Location: [facility];  Service: Orthopedics;  Laterality: Right;    Social History:  reports that he quit smoking about 33 years ago. He has never used smokeless tobacco. He reports that he does not drink alcohol or use drugs.   Family History  Problem Relation Age of Onset  . Heart attack Mother   . Heart attack Father     No Known Allergies   Medications: I have reviewed patients current medications as documented in Epic Anti-infectives    Start     Dose/Rate Route Frequency Ordered Stop   06/19/16 2100  nafcillin 2 g in dextrose 5 % 100 mL IVPB     2 g 200 mL/hr over 30 Minutes Intravenous Every 4 hours 06/18/16 2032     06/18/16 2200  vancomycin (VANCOCIN) IVPB 1000 mg/200 mL premix     1,000 mg 200 mL/hr over 60 Minutes Intravenous Every 24 hours 06/18/16 1702     06/18/16 2100  vancomycin (VANCOCIN) IVPB 750 mg/150 ml premix  Status:  Discontinued     750 mg 150 mL/hr over 60 Minutes Intravenous Every 24 hours 06/17/16 2103 06/17/16 2103   06/18/16 2100  vancomycin (VANCOCIN) IVPB 1000 mg/200 mL premix  Status:  Discontinued  1,000 mg 200 mL/hr over 60 Minutes Intravenous Every 24 hours 06/17/16 2103 06/18/16 1638   06/18/16 2100  nafcillin IVPB 2 g  Status:  Discontinued     2 g 200 mL/hr over 30 Minutes Intravenous Every 4 hours 06/18/16 2025 06/18/16 2026   06/18/16 2100  nafcillin 2 g in dextrose 5 % 50 mL IVPB     2 g 100 mL/hr over 30 Minutes Intravenous Every 4 hours 06/18/16 2027 06/19/16 2059   06/18/16 1700  nafcillin injection 2 g  Status:  Discontinued     2 g Intravenous Every 4 hours 06/18/16 1641 06/18/16 1650   06/18/16 1700  nafcillin 2 g in dextrose 5 % 100 mL IVPB  Status:  Discontinued     2 g 200 mL/hr over 30 Minutes Intravenous Every 4 hours 06/18/16 1650 06/18/16 2032   06/18/16  1600  fluconazole (DIFLUCAN) tablet 100 mg     100 mg Oral Daily 06/18/16 1516     06/18/16 0400  piperacillin-tazobactam (ZOSYN) IVPB 3.375 g  Status:  Discontinued     3.375 g 12.5 mL/hr over 240 Minutes Intravenous Every 8 hours 06/17/16 2103 06/18/16 1556   06/17/16 2045  piperacillin-tazobactam (ZOSYN) IVPB 3.375 g     3.375 g 100 mL/hr over 30 Minutes Intravenous  Once 06/17/16 2032 06/17/16 2152   06/17/16 2045  vancomycin (VANCOCIN) IVPB 1000 mg/200 mL premix     1,000 mg 200 mL/hr over 60 Minutes Intravenous  Once 06/17/16 2032 06/17/16 2334         ROS: as in HPI otherwise remainder of 12 point Review of Systems is not obtainable due to patient's confusion   Blood pressure (!) 154/46, pulse 70, temperature 99 F (37.2 C), temperature source Oral, resp. rate (!) 24, height '5\' 11"'$  (1.803 m), weight 198 lb 10.2 oz (90.1 kg), SpO2 96 %. General: Alert and awake, oriented person but thinks he is in nursing home, says his cardiologist is in [place]. HEENT: anicteric sclera,  EOMI, oropharynx clear and without exudate Cardiovascular:regular rate, normal r,  no murmur rubs or gallops Pulmonary: fairly clear to auscultation bilaterally, no wheezing, rales or rhonchi Gastrointestinal: soft he has signficant edema of the skin with some tenderness of abdominal wall though not deep Musculoskeletal: left knee tender with effusion  Skin, severe intertrigo  06/19/16:    Calf with venous stasis changes    Stage 2 decubitus ulcer sacral area      Neuro: nonfocal, strength and sensation intact   Results for orders placed or performed during the hospital encounter of 06/17/16 (from the past 48 hour(s))  CBG monitoring, ED     Status: Abnormal   Collection Time: 06/17/16  7:10 PM  Result Value Ref Range   Glucose-Capillary 368 (H) 65 - 99 mg/dL  Blood Culture (routine x 2)     Status: Abnormal   Collection Time: 06/17/16  7:52 PM  Result Value Ref Range   Specimen  Description BLOOD BLOOD LEFT FOREARM    Special Requests IN PEDIATRIC BOTTLE Blood Culture adequate volume    Culture  Setup Time      GRAM POSITIVE COCCI IN CLUSTERS IN PEDIATRIC BOTTLE CRITICAL VALUE NOTED.  VALUE IS CONSISTENT WITH PREVIOUSLY REPORTED AND CALLED VALUE.    Culture (A)     STAPHYLOCOCCUS SPECIES (COAGULASE NEGATIVE) THE SIGNIFICANCE OF ISOLATING THIS ORGANISM FROM A SINGLE SET OF BLOOD CULTURES WHEN MULTIPLE SETS ARE DRAWN IS UNCERTAIN. PLEASE NOTIFY THE MICROBIOLOGY DEPARTMENT WITHIN ONE  WEEK IF SPECIATION AND SENSITIVITIES ARE REQUIRED. Performed at [facility], [place] [address], [place], [place] [address]    Report Status 06/19/2016 FINAL   Urinalysis, Routine w reflex microscopic     Status: Abnormal   Collection Time: 06/17/16  7:59 PM  Result Value Ref Range   Color, Urine YELLOW YELLOW   APPearance CLEAR CLEAR   Specific Gravity, Urine 1.020 1.005 - 1.030   pH 5.0 5.0 - 8.0   Glucose, UA >=500 (A) NEGATIVE mg/dL   Hgb urine dipstick SMALL (A) NEGATIVE   Bilirubin Urine NEGATIVE NEGATIVE   Ketones, ur 5 (A) NEGATIVE mg/dL   Protein, ur 30 (A) NEGATIVE mg/dL   Nitrite NEGATIVE NEGATIVE   Leukocytes, UA NEGATIVE NEGATIVE   RBC / HPF 0-5 0 - 5 RBC/hpf   WBC, UA 0-5 0 - 5 WBC/hpf   Bacteria, UA NONE SEEN NONE SEEN   Squamous Epithelial / LPF NONE SEEN NONE SEEN   Mucous PRESENT   Strep pneumoniae urinary antigen     Status: None   Collection Time: 06/17/16  7:59 PM  Result Value Ref Range   Strep Pneumo Urinary Antigen NEGATIVE NEGATIVE    Comment:        Infection due to S. pneumoniae cannot be absolutely ruled out since the antigen present may be below the detection limit of the test. Performed at [facility], [address], [place], [place] [address]   Comprehensive metabolic panel     Status: Abnormal   Collection Time: 06/17/16  8:12 PM  Result Value Ref Range   Sodium 137 135 - 145 mmol/L   Potassium 4.5 3.5 - 5.1 mmol/L    Chloride 104 101 - 111 mmol/L   CO2 22 22 - 32 mmol/L   Glucose, Bld 411 (H) 65 - 99 mg/dL   BUN 21 (H) 6 - 20 mg/dL   Creatinine, Ser 1.87 (H) 0.61 - 1.24 mg/dL   Calcium 8.7 (L) 8.9 - 10.3 mg/dL   Total Protein 7.1 6.5 - 8.1 g/dL   Albumin 3.3 (L) 3.5 - 5.0 g/dL   AST 21 15 - 41 U/L   ALT 8 (L) 17 - 63 U/L   Alkaline Phosphatase 70 38 - 126 U/L   Total Bilirubin 0.6 0.3 - 1.2 mg/dL   GFR calc non Af Amer 30 (L) >60 mL/min   GFR calc Af Amer 35 (L) >60 mL/min    Comment: (NOTE) The eGFR has been calculated using the CKD EPI equation. This calculation has not been validated in all clinical situations. eGFR's persistently <60 mL/min signify possible Chronic Kidney Disease.    Anion gap 11 5 - 15  CBC WITH DIFFERENTIAL     Status: Abnormal   Collection Time: 06/17/16  8:12 PM  Result Value Ref Range   WBC 20.1 (H) 4.0 - 10.5 K/uL   RBC 4.91 4.22 - 5.81 MIL/uL   Hemoglobin 13.9 13.0 - 17.0 g/dL   HCT 42.6 39.0 - 52.0 %   MCV 86.8 78.0 - 100.0 fL   MCH 28.3 26.0 - 34.0 pg   MCHC 32.6 30.0 - 36.0 g/dL   RDW 15.1 11.5 - 15.5 %   Platelets 273 150 - 400 K/uL   Neutrophils Relative % 79 %   Neutro Abs 15.7 (H) 1.7 - 7.7 K/uL   Lymphocytes Relative 9 %   Lymphs Abs 1.8 0.7 - 4.0 K/uL   Monocytes Relative 12 %   Monocytes Absolute  2.5 (H) 0.1 - 1.0 K/uL   Eosinophils Relative 0 %   Eosinophils Absolute 0.0 0.0 - 0.7 K/uL   Basophils Relative 0 %   Basophils Absolute 0.1 0.0 - 0.1 K/uL  Blood Culture (routine x 2)     Status: Abnormal (Preliminary result)   Collection Time: 06/17/16  8:12 PM  Result Value Ref Range   Specimen Description BLOOD RIGHT ANTECUBITAL    Special Requests      BOTTLES DRAWN AEROBIC AND ANAEROBIC Blood Culture results may not be optimal due to an inadequate volume of blood received in culture bottles   Culture  Setup Time      GRAM POSITIVE COCCI IN CHAINS IN BOTH AEROBIC AND ANAEROBIC BOTTLES CRITICAL RESULT CALLED TO, READ BACK BY AND VERIFIED WITH:  N. Glogovac Pharm.D. 15:45 06/18/16 (wilsonm)    Culture (A)     STAPHYLOCOCCUS AUREUS ENTEROCOCCUS SPECIES CULTURE REINCUBATED FOR BETTER GROWTH Performed at [facility], [place] [address] [place], [place] [address]    Report Status PENDING   Blood Culture ID Panel (Reflexed)     Status: Abnormal   Collection Time: 06/17/16  8:12 PM  Result Value Ref Range   Enterococcus species DETECTED (A) NOT DETECTED    Comment: CRITICAL RESULT CALLED TO, READ BACK BY AND VERIFIED WITH: N. Glogovac Pharm.D. 15:45 06/18/16 (wilsonm)    Vancomycin resistance NOT DETECTED NOT DETECTED   Listeria monocytogenes NOT DETECTED NOT DETECTED   Staphylococcus species DETECTED (A) NOT DETECTED    Comment: CRITICAL RESULT CALLED TO, READ BACK BY AND VERIFIED WITH: N. Glogovac Pharm.D. 15:45 06/18/16 (wilsonm)    Staphylococcus aureus DETECTED (A) NOT DETECTED    Comment: Methicillin (oxacillin) susceptible Staphylococcus aureus (MSSA). Preferred therapy is anti staphylococcal beta lactam antibiotic (Cefazolin or Nafcillin), unless clinically contraindicated. CRITICAL RESULT CALLED TO, READ BACK BY AND VERIFIED WITH: N. Glogovac Pharm.D. 15:45 06/18/16 (wilsonm)    Methicillin resistance NOT DETECTED NOT DETECTED   Streptococcus species NOT DETECTED NOT DETECTED   Streptococcus agalactiae NOT DETECTED NOT DETECTED   Streptococcus pneumoniae NOT DETECTED NOT DETECTED   Streptococcus pyogenes NOT DETECTED NOT DETECTED   Acinetobacter baumannii NOT DETECTED NOT DETECTED   Enterobacteriaceae species NOT DETECTED NOT DETECTED   Enterobacter cloacae complex NOT DETECTED NOT DETECTED   Escherichia coli NOT DETECTED NOT DETECTED   Klebsiella oxytoca NOT DETECTED NOT DETECTED   Klebsiella pneumoniae NOT DETECTED NOT DETECTED   Proteus species NOT DETECTED NOT DETECTED   Serratia marcescens NOT DETECTED NOT DETECTED   Haemophilus influenzae NOT DETECTED NOT DETECTED   Neisseria meningitidis NOT DETECTED NOT  DETECTED   Pseudomonas aeruginosa NOT DETECTED NOT DETECTED   Candida albicans NOT DETECTED NOT DETECTED   Candida glabrata NOT DETECTED NOT DETECTED   Candida krusei NOT DETECTED NOT DETECTED   Candida parapsilosis NOT DETECTED NOT DETECTED   Candida tropicalis NOT DETECTED NOT DETECTED    Comment: Performed at [facility], [address] [place], [place] [address]  I-Stat CG4 Lactic Acid, ED  (not at  [facility])     Status: Abnormal   Collection Time: 06/17/16  8:28 PM  Result Value Ref Range   Lactic Acid, Venous 4.42 (HH) 0.5 - 1.9 mmol/L   Comment NOTIFIED PHYSICIAN   Influenza panel by PCR (type A & B)     Status: None   Collection Time: 06/17/16  9:17 PM  Result Value Ref Range   Influenza A By PCR NEGATIVE NEGATIVE  Influenza B By PCR NEGATIVE NEGATIVE    Comment: (NOTE) The Xpert Xpress Flu assay is intended as an aid in the diagnosis of  influenza and should not be used as a sole basis for treatment.  This  assay is FDA approved for nasopharyngeal swab specimens only. Nasal  washings and aspirates are unacceptable for Xpert Xpress Flu testing.   I-Stat CG4 Lactic Acid, ED  (not at  [facility])     Status: Abnormal   Collection Time: 06/17/16 11:53 PM  Result Value Ref Range   Lactic Acid, Venous 2.95 (HH) 0.5 - 1.9 mmol/L   Comment NOTIFIED PHYSICIAN   Lactic acid, plasma     Status: Abnormal   Collection Time: 06/18/16 12:42 AM  Result Value Ref Range   Lactic Acid, Venous 5.4 (HH) 0.5 - 1.9 mmol/L    Comment: CRITICAL RESULT CALLED TO, READ BACK BY AND VERIFIED WITH: C.DENNY,RN 0142 06/18/16 W.SHEA   Procalcitonin     Status: None   Collection Time: 06/18/16 12:42 AM  Result Value Ref Range   Procalcitonin 1.94 ng/mL    Comment:        Interpretation: PCT > 0.5 ng/mL and <= 2 ng/mL: Systemic infection (sepsis) is possible, but other conditions are known to elevate PCT as well. (NOTE)         ICU PCT Algorithm               Non ICU PCT Algorithm     ----------------------------     ------------------------------         PCT < 0.25 ng/mL                 PCT < 0.1 ng/mL     Stopping of antibiotics            Stopping of antibiotics       strongly encouraged.               strongly encouraged.    ----------------------------     ------------------------------       PCT level decrease by               PCT < 0.25 ng/mL       >= 80% from peak PCT       OR PCT 0.25 - 0.5 ng/mL          Stopping of antibiotics                                             encouraged.     Stopping of antibiotics           encouraged.    ----------------------------     ------------------------------       PCT level decrease by              PCT >= 0.25 ng/mL       < 80% from peak PCT        AND PCT >= 0.5 ng/mL             Continuing antibiotics                                              encouraged.       Continuing antibiotics  encouraged.    ----------------------------     ------------------------------     PCT level increase compared          PCT > 0.5 ng/mL         with peak PCT AND          PCT >= 0.5 ng/mL             Escalation of antibiotics                                          strongly encouraged.      Escalation of antibiotics        strongly encouraged.   Protime-INR     Status: Abnormal   Collection Time: 06/18/16 12:42 AM  Result Value Ref Range   Prothrombin Time 16.1 (H) 11.4 - 15.2 seconds   INR 1.29   APTT     Status: None   Collection Time: 06/18/16 12:42 AM  Result Value Ref Range   aPTT 34 24 - 36 seconds  MRSA PCR Screening     Status: Abnormal   Collection Time: 06/18/16  1:40 AM  Result Value Ref Range   MRSA by PCR POSITIVE (A) NEGATIVE    Comment:        The GeneXpert MRSA Assay (FDA approved for NASAL specimens only), is one component of a comprehensive MRSA colonization surveillance program. It is not intended to diagnose MRSA infection nor to guide or monitor treatment for MRSA infections. RESULT  CALLED TO, READ BACK BY AND VERIFIED WITH: C.SMITH,RN [id number] 06/18/16 W.SHEA   Glucose, capillary     Status: Abnormal   Collection Time: 06/18/16  2:28 AM  Result Value Ref Range   Glucose-Capillary 352 (H) 65 - 99 mg/dL  Culture, respiratory (NON-Expectorated)     Status: None (Preliminary result)   Collection Time: 06/18/16  6:32 AM  Result Value Ref Range   Specimen Description TRACHEAL ASPIRATE    Special Requests NONE    Gram Stain      ABUNDANT WBC PRESENT, PREDOMINANTLY PMN RARE SQUAMOUS EPITHELIAL CELLS PRESENT MODERATE GRAM POSITIVE RODS GRAM POSITIVE COCCI IN PAIRS    Culture      CULTURE REINCUBATED FOR BETTER GROWTH Performed at [facility], [place] [address] [place], [place] [address]    Report Status PENDING   CBC     Status: Abnormal   Collection Time: 06/18/16  8:12 AM  Result Value Ref Range   WBC 18.7 (H) 4.0 - 10.5 K/uL   RBC 4.52 4.22 - 5.81 MIL/uL   Hemoglobin 12.7 (L) 13.0 - 17.0 g/dL   HCT 38.7 (L) 39.0 - 52.0 %   MCV 85.6 78.0 - 100.0 fL   MCH 28.1 26.0 - 34.0 pg   MCHC 32.8 30.0 - 36.0 g/dL   RDW 15.3 11.5 - 15.5 %   Platelets 249 150 - 400 K/uL  Basic metabolic panel     Status: Abnormal   Collection Time: 06/18/16  8:12 AM  Result Value Ref Range   Sodium 140 135 - 145 mmol/L   Potassium 4.0 3.5 - 5.1 mmol/L   Chloride 109 101 - 111 mmol/L   CO2 21 (L) 22 - 32 mmol/L   Glucose, Bld 302 (H) 65 - 99 mg/dL   BUN 20 6 - 20 mg/dL   Creatinine, Ser 1.61 (H) 0.61 - 1.24 mg/dL  Calcium 8.0 (L) 8.9 - 10.3 mg/dL   GFR calc non Af Amer 36 (L) >60 mL/min   GFR calc Af Amer 42 (L) >60 mL/min    Comment: (NOTE) The eGFR has been calculated using the CKD EPI equation. This calculation has not been validated in all clinical situations. eGFR's persistently <60 mL/min signify possible Chronic Kidney Disease.    Anion gap 10 5 - 15  Lactic acid, plasma     Status: Abnormal   Collection Time: 06/18/16  8:22 AM  Result Value Ref Range   Lactic Acid,  Venous 2.2 (HH) 0.5 - 1.9 mmol/L    Comment: CRITICAL RESULT CALLED TO, READ BACK BY AND VERIFIED WITH: D.ALDRIDGE RN [id number] A.QUIZON   Glucose, capillary     Status: Abnormal   Collection Time: 06/18/16  8:41 AM  Result Value Ref Range   Glucose-Capillary 286 (H) 65 - 99 mg/dL   Comment 1 Notify RN    Comment 2 Document in Chart   Glucose, capillary     Status: Abnormal   Collection Time: 06/18/16 12:21 PM  Result Value Ref Range   Glucose-Capillary 165 (H) 65 - 99 mg/dL   Comment 1 Notify RN    Comment 2 Document in Chart   Glucose, capillary     Status: Abnormal   Collection Time: 06/18/16  4:52 PM  Result Value Ref Range   Glucose-Capillary 138 (H) 65 - 99 mg/dL   Comment 1 Notify RN    Comment 2 Document in Chart   Culture, blood (Routine X 2) w Reflex to ID Panel     Status: None (Preliminary result)   Collection Time: 06/18/16  6:16 PM  Result Value Ref Range   Specimen Description BLOOD RIGHT ARM    Special Requests IN PEDIATRIC BOTTLE Blood Culture adequate volume    Culture      NO GROWTH < 24 HOURS Performed at [facility], [address] [place], [place] [address]    Report Status PENDING   Culture, blood (Routine X 2) w Reflex to ID Panel     Status: None (Preliminary result)   Collection Time: 06/18/16  6:22 PM  Result Value Ref Range   Specimen Description BLOOD RIGHT ANTECUBITAL    Special Requests IN PEDIATRIC BOTTLE Blood Culture adequate volume    Culture      NO GROWTH < 24 HOURS Performed at [facility], [place] [address] [place], [place] [address]    Report Status PENDING   Glucose, capillary     Status: Abnormal   Collection Time: 06/18/16 10:47 PM  Result Value Ref Range   Glucose-Capillary 207 (H) 65 - 99 mg/dL  CBC     Status: Abnormal   Collection Time: 06/19/16  3:34 AM  Result Value Ref Range   WBC 20.6 (H) 4.0 - 10.5 K/uL   RBC 4.63 4.22 - 5.81 MIL/uL   Hemoglobin 13.2 13.0 - 17.0 g/dL   HCT 39.8 39.0 - 52.0 %   MCV  86.0 78.0 - 100.0 fL   MCH 28.5 26.0 - 34.0 pg   MCHC 33.2 30.0 - 36.0 g/dL   RDW 15.4 11.5 - 15.5 %   Platelets 268 150 - 400 K/uL  Basic metabolic panel     Status: Abnormal   Collection Time: 06/19/16  3:34 AM  Result Value Ref Range   Sodium 141 135 - 145 mmol/L   Potassium 3.6 3.5 - 5.1 mmol/L   Chloride  110 101 - 111 mmol/L   CO2 22 22 - 32 mmol/L   Glucose, Bld 209 (H) 65 - 99 mg/dL   BUN 20 6 - 20 mg/dL   Creatinine, Ser 1.71 (H) 0.61 - 1.24 mg/dL   Calcium 8.1 (L) 8.9 - 10.3 mg/dL   GFR calc non Af Amer 33 (L) >60 mL/min   GFR calc Af Amer 39 (L) >60 mL/min    Comment: (NOTE) The eGFR has been calculated using the CKD EPI equation. This calculation has not been validated in all clinical situations. eGFR's persistently <60 mL/min signify possible Chronic Kidney Disease.    Anion gap 9 5 - 15  Glucose, capillary     Status: Abnormal   Collection Time: 06/19/16  7:37 AM  Result Value Ref Range   Glucose-Capillary 201 (H) 65 - 99 mg/dL   Comment 1 Notify RN    Comment 2 Document in Chart   Synovial cell count + diff, w/ crystals     Status: Abnormal   Collection Time: 06/19/16 12:25 PM  Result Value Ref Range   Color, Synovial YELLOW YELLOW   Appearance-Synovial TURBID (A) CLEAR   Crystals, Fluid NO CRYSTALS SEEN    WBC, Synovial 170,970 (H) 0 - 200 /cu mm   Neutrophil, Synovial 98 (H) 0 - 25 %   Monocyte-Macrophage-Synovial Fluid 2 (L) 50 - 90 %  Pathologist smear review     Status: None   Collection Time: 06/19/16  1:30 PM  Result Value Ref Range   Path Review Reviewed By Violet Baldy, M.D.     Comment: 5.4.18 ACUTE INFLAMMATION ASSOCIATED WITH INTRACELLULAR BACTERIA.    '@BRIEFLABTABLE'$ (sdes,specrequest,cult,reptstatus)   ) Recent Results (from the past 720 hour(s))  Blood Culture (routine x 2)     Status: Abnormal   Collection Time: 06/17/16  7:52 PM  Result Value Ref Range Status   Specimen Description BLOOD BLOOD LEFT FOREARM  Final   Special  Requests IN PEDIATRIC BOTTLE Blood Culture adequate volume  Final   Culture  Setup Time   Final    GRAM POSITIVE COCCI IN CLUSTERS IN PEDIATRIC BOTTLE CRITICAL VALUE NOTED.  VALUE IS CONSISTENT WITH PREVIOUSLY REPORTED AND CALLED VALUE.    Culture (A)  Final    STAPHYLOCOCCUS SPECIES (COAGULASE NEGATIVE) THE SIGNIFICANCE OF ISOLATING THIS ORGANISM FROM A SINGLE SET OF BLOOD CULTURES WHEN MULTIPLE SETS ARE DRAWN IS UNCERTAIN. PLEASE NOTIFY THE MICROBIOLOGY DEPARTMENT WITHIN ONE WEEK IF SPECIATION AND SENSITIVITIES ARE REQUIRED. Performed at [facility], [place] [address], [place], [place] [address]    Report Status 06/19/2016 FINAL  Final  Blood Culture (routine x 2)     Status: Abnormal (Preliminary result)   Collection Time: 06/17/16  8:12 PM  Result Value Ref Range Status   Specimen Description BLOOD RIGHT ANTECUBITAL  Final   Special Requests   Final    BOTTLES DRAWN AEROBIC AND ANAEROBIC Blood Culture results may not be optimal due to an inadequate volume of blood received in culture bottles   Culture  Setup Time   Final    GRAM POSITIVE COCCI IN CHAINS IN BOTH AEROBIC AND ANAEROBIC BOTTLES CRITICAL RESULT CALLED TO, READ BACK BY AND VERIFIED WITH: N. Glogovac Pharm.D. 15:45 06/18/16 (wilsonm)    Culture (A)  Final    STAPHYLOCOCCUS AUREUS ENTEROCOCCUS SPECIES CULTURE REINCUBATED FOR BETTER GROWTH Performed at [facility], [place] [address] [place], [place] [address]    Report Status PENDING  Incomplete  Blood  Culture ID Panel (Reflexed)     Status: Abnormal   Collection Time: 06/17/16  8:12 PM  Result Value Ref Range Status   Enterococcus species DETECTED (A) NOT DETECTED Final    Comment: CRITICAL RESULT CALLED TO, READ BACK BY AND VERIFIED WITH: N. Glogovac Pharm.D. 15:45 06/18/16 (wilsonm)    Vancomycin resistance NOT DETECTED NOT DETECTED Final   Listeria monocytogenes NOT DETECTED NOT DETECTED Final   Staphylococcus species DETECTED (A) NOT DETECTED Final     Comment: CRITICAL RESULT CALLED TO, READ BACK BY AND VERIFIED WITH: N. Glogovac Pharm.D. 15:45 06/18/16 (wilsonm)    Staphylococcus aureus DETECTED (A) NOT DETECTED Final    Comment: Methicillin (oxacillin) susceptible Staphylococcus aureus (MSSA). Preferred therapy is anti staphylococcal beta lactam antibiotic (Cefazolin or Nafcillin), unless clinically contraindicated. CRITICAL RESULT CALLED TO, READ BACK BY AND VERIFIED WITH: N. Glogovac Pharm.D. 15:45 06/18/16 (wilsonm)    Methicillin resistance NOT DETECTED NOT DETECTED Final   Streptococcus species NOT DETECTED NOT DETECTED Final   Streptococcus agalactiae NOT DETECTED NOT DETECTED Final   Streptococcus pneumoniae NOT DETECTED NOT DETECTED Final   Streptococcus pyogenes NOT DETECTED NOT DETECTED Final   Acinetobacter baumannii NOT DETECTED NOT DETECTED Final   Enterobacteriaceae species NOT DETECTED NOT DETECTED Final   Enterobacter cloacae complex NOT DETECTED NOT DETECTED Final   Escherichia coli NOT DETECTED NOT DETECTED Final   Klebsiella oxytoca NOT DETECTED NOT DETECTED Final   Klebsiella pneumoniae NOT DETECTED NOT DETECTED Final   Proteus species NOT DETECTED NOT DETECTED Final   Serratia marcescens NOT DETECTED NOT DETECTED Final   Haemophilus influenzae NOT DETECTED NOT DETECTED Final   Neisseria meningitidis NOT DETECTED NOT DETECTED Final   Pseudomonas aeruginosa NOT DETECTED NOT DETECTED Final   Candida albicans NOT DETECTED NOT DETECTED Final   Candida glabrata NOT DETECTED NOT DETECTED Final   Candida krusei NOT DETECTED NOT DETECTED Final   Candida parapsilosis NOT DETECTED NOT DETECTED Final   Candida tropicalis NOT DETECTED NOT DETECTED Final    Comment: Performed at [facility], [address] [place], [place] [address]  MRSA PCR Screening     Status: Abnormal   Collection Time: 06/18/16  1:40 AM  Result Value Ref Range Status   MRSA by PCR POSITIVE (A) NEGATIVE Final    Comment:        The GeneXpert  MRSA Assay (FDA approved for NASAL specimens only), is one component of a comprehensive MRSA colonization surveillance program. It is not intended to diagnose MRSA infection nor to guide or monitor treatment for MRSA infections. RESULT CALLED TO, READ BACK BY AND VERIFIED WITH: C.SMITH,RN [id number] 06/18/16 W.SHEA   Culture, respiratory (NON-Expectorated)     Status: None (Preliminary result)   Collection Time: 06/18/16  6:32 AM  Result Value Ref Range Status   Specimen Description TRACHEAL ASPIRATE  Final   Special Requests NONE  Final   Gram Stain   Final    ABUNDANT WBC PRESENT, PREDOMINANTLY PMN RARE SQUAMOUS EPITHELIAL CELLS PRESENT MODERATE GRAM POSITIVE RODS GRAM POSITIVE COCCI IN PAIRS    Culture   Final    CULTURE REINCUBATED FOR BETTER GROWTH Performed at [facility], [place] [address] [place], [place] [address]    Report Status PENDING  Incomplete  Culture, blood (Routine X 2) w Reflex to ID Panel     Status: None (Preliminary result)   Collection Time: 06/18/16  6:16 PM  Result Value Ref Range Status   Specimen Description BLOOD  RIGHT ARM  Final   Special Requests IN PEDIATRIC BOTTLE Blood Culture adequate volume  Final   Culture   Final    NO GROWTH < 24 HOURS Performed at [facility], [address] [place], [place] [address]    Report Status PENDING  Incomplete  Culture, blood (Routine X 2) w Reflex to ID Panel     Status: None (Preliminary result)   Collection Time: 06/18/16  6:22 PM  Result Value Ref Range Status   Specimen Description BLOOD RIGHT ANTECUBITAL  Final   Special Requests IN PEDIATRIC BOTTLE Blood Culture adequate volume  Final   Culture   Final    NO GROWTH < 24 HOURS Performed at [facility], [place] [address], [place], [place] [address]    Report Status PENDING  Incomplete     Impression/Recommendation  Principal Problem:   Sepsis ([place]) Active Problems:   Hypertension   Diabetes mellitus ([place])   Chronic kidney  disease (CKD), stage III (moderate)   Cough   Leukocytosis   Pacemaker   Pressure injury of skin   Randy [name] is a 83 y.o. 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 If he is pacer dependent he would need temporary PM in the interim  I would treat him for 6 weeks minimum post surgery on knee and post removal of PM  #2 INtetrigo: agree with fluconazole  #3 Goals of care: would consider palliative care consult given # of severely morbid conditions he is currently facing   Dr. Megan Salon will be covering for our group this weekend and is available for questions.        06/19/2016, 2:28 PM   Thank you so much for this interesting consult  [place] for [place] [phone] (pager) [phone] (office) 06/19/2016, 2:28 PM  Rhina [place] 06/19/2016, 2:28 PM

## 2016-06-20 DIAGNOSIS — R7881 Bacteremia: Secondary | ICD-10-CM

## 2016-06-20 LAB — CBC
HEMATOCRIT: 37.1 % — AB (ref 39.0–52.0)
HEMOGLOBIN: 11.9 g/dL — AB (ref 13.0–17.0)
MCH: 27.7 pg (ref 26.0–34.0)
MCHC: 32.1 g/dL (ref 30.0–36.0)
MCV: 86.3 fL (ref 78.0–100.0)
Platelets: 279 10*3/uL (ref 150–400)
RBC: 4.3 MIL/uL (ref 4.22–5.81)
RDW: 15.6 % — AB (ref 11.5–15.5)
WBC: 14.5 10*3/uL — ABNORMAL HIGH (ref 4.0–10.5)

## 2016-06-20 LAB — CULTURE, RESPIRATORY: CULTURE: NORMAL

## 2016-06-20 LAB — BASIC METABOLIC PANEL
Anion gap: 7 (ref 5–15)
BUN: 23 mg/dL — ABNORMAL HIGH (ref 6–20)
CHLORIDE: 114 mmol/L — AB (ref 101–111)
CO2: 22 mmol/L (ref 22–32)
CREATININE: 2.03 mg/dL — AB (ref 0.61–1.24)
Calcium: 7.6 mg/dL — ABNORMAL LOW (ref 8.9–10.3)
GFR calc non Af Amer: 27 mL/min — ABNORMAL LOW (ref >60)
GFR, EST AFRICAN AMERICAN: 32 mL/min — AB (ref >60)
GLUCOSE: 116 mg/dL — AB (ref 65–99)
Potassium: 3.1 mmol/L — ABNORMAL LOW (ref 3.5–5.1)
Sodium: 143 mmol/L (ref 135–145)

## 2016-06-20 LAB — GLUCOSE, CAPILLARY
GLUCOSE-CAPILLARY: 115 mg/dL — AB (ref 65–99)
GLUCOSE-CAPILLARY: 127 mg/dL — AB (ref 65–99)
Glucose-Capillary: 163 mg/dL — ABNORMAL HIGH (ref 65–99)
Glucose-Capillary: 158 mg/dL — ABNORMAL HIGH (ref 65–99)

## 2016-06-20 LAB — VANCOMYCIN, TROUGH: Vancomycin Tr: 14 ug/mL — ABNORMAL LOW (ref 15–20)

## 2016-06-20 MED ORDER — ZINC OXIDE 40 % EX OINT
1.0000 "application " | TOPICAL_OINTMENT | Freq: Two times a day (BID) | CUTANEOUS | Status: DC
  Filled 2016-06-20: qty 114

## 2016-06-20 MED ORDER — ZINC OXIDE 40 % EX OINT
TOPICAL_OINTMENT | Freq: Two times a day (BID) | CUTANEOUS | Status: DC
  Administered 2016-06-20: 17:00:00 via TOPICAL
  Administered 2016-06-20: 1 via TOPICAL
  Administered 2016-06-21 – 2016-06-23 (×6): via TOPICAL
  Administered 2016-06-24 (×2): 1 via TOPICAL
  Administered 2016-06-25: 10:00:00 via TOPICAL
  Filled 2016-06-20 (×2): qty 57

## 2016-06-20 NOTE — Progress Notes (Signed)
    [facility] has been requested to perform a transesophageal echocardiogram on Randy [name] for bacteremia.  After careful review of history and examination, the risks and benefits of transesophageal echocardiogram have been explained including risks of esophageal damage, perforation (1:10,000 risk), bleeding, pharyngeal hematoma as well as other potential complications associated with conscious sedation including aspiration, arrhythmia, respiratory failure and death. Alternatives to treatment were discussed, questions were answered. Patient is willing to proceed.   Lyda Jester, PA-C  06/20/2016 8:47 AM

## 2016-06-20 NOTE — Progress Notes (Signed)
Pharmacy Antibiotic Note  Randy [name] is a 83 y.o. male with pacemaker and prosthetic knee admitted on 06/17/2016 with sepsis. Currently patient on Nafcillin and Vancomycin per ID recs for prosthetic knee infection and bacteremia due to MSSA, Enterococcus, and CoNS. TTE negative for vegetations, TEE pending for Monday. AKA planned per ortho next week. Patient also on Fluconazole per MD for candidiasis of groin area.   Today, 06/20/16:  - Afebrile - WBC elevated, but improving - SCr increased to 2.03 with CrCl ~ 28 ml/min   Plan: - Continue Vancomycin 1g IV q24h. - Check Vancomycin trough level prior to dose due tonight.  - Continue Nafcillin, Fluconazole dosing per MD.  - F/u renal function, cultures, clinical course, further ID recs.  _________________________  Height: 5\' 11"  (180.3 cm) Weight: 206 lb 9.1 oz (93.7 kg) IBW/kg (Calculated) : 75.3  Temp (24hrs), Avg:97.7 F (36.5 C), Min:97.4 F (36.3 C), Max:97.9 F (36.6 C)   Recent Labs Lab 06/17/16 2012 06/17/16 2028 06/17/16 2353 06/18/16 0042 06/18/16 0812 06/18/16 0822 06/19/16 0334 06/20/16 0351  WBC 20.1*  --   --   --  18.7*  --  20.6* 14.5*  CREATININE 1.87*  --   --   --  1.61*  --  1.71* 2.03*  LATICACIDVEN  --  4.42* 2.95* 5.4*  --  2.2*  --   --     Estimated Creatinine Clearance: 28.3 mL/min (A) (by C-G formula based on SCr of 2.03 mg/dL (H)).    No Known Allergies  Antimicrobials this admission:  5/2 vanc>> 5/2 zosyn >> 5/3 5/3 naficillin>> 5/3 fluconazole >>  Dose adjustments this admission:  5/5 2130 VT: pending  Microbiology results:  5/2 BCx: 1/2 with Staph aureus, Enterococcus faecalis; 1/2 with CoNS 5/2 influenza PCR: neg 5/2 strep/legionella ur ag: neg 5/3 MRSA PCR: positive 5/3 Trach aspirate: c/w normal respiratory flora 5/3 BCx: 1/2 Staph aureus (BCID = MSSA) 5/4 synovial fluid from R knee: moderate Staph aureus 5/4 BCx: NGTD   Thank you for allowing pharmacy to be a part of  this patient's care.   Lindell Spar, PharmD, BCPS Pager: [phone] 06/20/2016 2:58 PM

## 2016-06-20 NOTE — Progress Notes (Signed)
Pharmacy Antibiotic Note  Randy [name] is a 83 y.o. male with pacemaker and prosthetic knee admitted on 06/17/2016 with sepsis. Currently patient on Nafcillin and Vancomycin per ID recs for prosthetic knee infection and bacteremia due to MSSA, Enterococcus, and CoNS. TTE negative for vegetations, TEE pending for Monday. AKA planned per ortho next week. Patient also on Fluconazole per MD for candidiasis of groin area.   Today, 06/20/16:  - Afebrile - WBC elevated, but improving - SCr increased to 2.03 with CrCl ~ 28 ml/min -2116 VT=14 mg/L, Scr=2.03 on 1 gm IV q24h- just below goal of 15-20 mg/L but will not increase d/t renal functioning worsening. -f/u Scr with am labs   Plan: - Continue Vancomycin 1g IV q24h. - Continue Nafcillin, Fluconazole dosing per MD.  - F/u renal function, cultures, clinical course, further ID recs.  _________________________  Height: 5\' 11"  (180.3 cm) Weight: 206 lb 9.1 oz (93.7 kg) IBW/kg (Calculated) : 75.3  Temp (24hrs), Avg:97.8 F (36.6 C), Min:97.5 F (36.4 C), Max:98.4 F (36.9 C)   Recent Labs Lab 06/17/16 2012 06/17/16 2028 06/17/16 2353 06/18/16 0042 06/18/16 9233 06/18/16 0822 06/19/16 0334 06/20/16 0351 06/20/16 2116  WBC 20.1*  --   --   --  18.7*  --  20.6* 14.5*  --   CREATININE 1.87*  --   --   --  1.61*  --  1.71* 2.03*  --   LATICACIDVEN  --  4.42* 2.95* 5.4*  --  2.2*  --   --   --   VANCOTROUGH  --   --   --   --   --   --   --   --  14*    Estimated Creatinine Clearance: 28.3 mL/min (A) (by C-G formula based on SCr of 2.03 mg/dL (H)).    No Known Allergies  Antimicrobials this admission:  5/2 vanc>> 5/2 zosyn >> 5/3 5/3 naficillin>> 5/3 fluconazole >>  Dose adjustments this admission:  5/5 2130 VT=14 mg/L  Microbiology results:  5/2 BCx: 1/2 with Staph aureus, Enterococcus faecalis; 1/2 with CoNS 5/2 influenza PCR: neg 5/2 strep/legionella ur ag: neg 5/3 MRSA PCR: positive 5/3 Trach aspirate: c/w normal  respiratory flora 5/3 BCx: 1/2 Staph aureus (BCID = MSSA) 5/4 synovial fluid from R knee: moderate Staph aureus 5/4 BCx: NGTD   Thank you for allowing pharmacy to be a part of this patient's care.   Dorrene German 06/20/2016 2:58 PM

## 2016-06-20 NOTE — Progress Notes (Signed)
PROGRESS NOTE    Randy [name]  UKG:[id number] DOB: 1924/06/25 DOA: 06/17/2016 PCP: System, Pcp Not In   Chief Complaint  Patient presents with  . Altered Mental Status  . Hyperglycemia  . Fever    Brief Narrative:  HPI on 06/17/2016 by Dr. Lily Kocher [name] Randy is a 83 y.o. gentleman with a history of mild dementia/cognitive impairment, CAD with prior stent placement, history of atrial fibrillation S/P PPM implant (CHADS-Vasc score of at least four, no anticoagulation), HTN, HLD, DM, and CKD 3 with baseline creatinine around 1.3-1.5 who was referred to the ED via EMS by one of his caregivers for evaluation of increased lethargy, mental status changes, and cough.  The patient is nonambulatory at baseline and spends most of his day sitting up a recliner.  He has caregiver support 24 hours/day.  He has had cough for 4-5 days.  He has had two known sick contacts (son-in-law and caregiver).  He has had yellow sputum.  No chest pain or shortness of breath.  No nausea or vomiting.  No abdominal pain.  No dysuria.  No diarrhea. Today, he was noted to have increased lethargy and muffled speech with slurring.  No other focal weakenss.  911 was called and the patient was transferred to the ED for evaluation. No recent hospitalizations.  No recent antibiotics.  He has been using OTC mucinex for his symptoms. No overt signs or symptoms of aspiration.  Interim history 1/2 blood cultures +enterococcus and MSSA. ID consulted. TEE planned for 06/22/2016. Currently on vancomycin.   Assessment & Plan   Sepsis secondary to MSSA/Enterococcus bacteremia/Right prosthetic knee infection -Suspected respiratory source vs groin cellulitis vs bacteremia -Presented with fever, tachycardia, tachypnea, leukocytosis -Patient also had elevated pro calcitonin level as well as lactic acid -Initially placed on  vancomycin and zosyn, zosyn discontinued on 06/18/16 -Continue zosyn -Chest x-ray showed no cardiopulmonary  process -CT chest showed subpleural fibrosis, no acute pulmonary infiltrates. -Influenza PCR negative -Strep pneumonia urine antigen negative -UA unremarkable for infection -Blood cultures 06/17/2016: MSSA, enterococcus  -Infectious disease consulted and appreciated, recommended TEE and EP consult -Cardiology consulted and appreciated, plan for TEE on 06/22/2016 -Echocardiogram showed EF 55-60% -Repeat blood cultures 06/19/2016 show no growth to date -orthopedic consulted and appreciated (Dr. Mayer Camel, as patient does have RTKA (2016) and has had infection before) -s/p aspiration of the right knee joint showing 170 K WBC, culture shows GPC and peristalsis far. -Plan would be for possible AKA.  Essential hypertension -Currently on no home medications -Continue to monitor, will add on medications as needed  Diabetes mellitus, type II -Currently on no home medications -Continue insulin sliding scale, Levemir, CBG monitoring  History of atrial fibrillation -CHADSVASC 4 -Currently on no anticoagulation -Status post pacemaker placement- cardiology consulted as patient does have bacteremia  Acute on Chronic kidney disease, stage III -Creatinine baseline at 1.5 -Currently creatinine 2.03 -Continue to monitor BMP  Candidiasis  -Groin area appears to be erythematous and flaky -Diflucan ordered  Stage II pressure injury -moisture related -wound care consulted and appreciated  Moderate aortic stenosis -Noted on echocardiogram, TEE planned 4 06/22/2016  DVT Prophylaxis  lovenox  Code Status: Full  Family Communication: None at bedside. Son via phone.  Disposition Plan: Admitted. Continue to monitor in stepdown.  Consultants Infectious disease Cardiology Orthopedics  Procedures  Echocardiogram Right knee aspiration   Antibiotics   Anti-infectives    Start     Dose/Rate Route Frequency Ordered Stop   06/19/16 2100  nafcillin 2 g in dextrose 5 % 100 mL IVPB     2 g 200 mL/hr  over 30 Minutes Intravenous Every 4 hours 06/18/16 2032     06/18/16 2200  vancomycin (VANCOCIN) IVPB 1000 mg/200 mL premix     1,000 mg 200 mL/hr over 60 Minutes Intravenous Every 24 hours 06/18/16 1702     06/18/16 2100  vancomycin (VANCOCIN) IVPB 750 mg/150 ml premix  Status:  Discontinued     750 mg 150 mL/hr over 60 Minutes Intravenous Every 24 hours 06/17/16 2103 06/17/16 2103   06/18/16 2100  vancomycin (VANCOCIN) IVPB 1000 mg/200 mL premix  Status:  Discontinued     1,000 mg 200 mL/hr over 60 Minutes Intravenous Every 24 hours 06/17/16 2103 06/18/16 1638   06/18/16 2100  nafcillin IVPB 2 g  Status:  Discontinued     2 g 200 mL/hr over 30 Minutes Intravenous Every 4 hours 06/18/16 2025 06/18/16 2026   06/18/16 2100  nafcillin 2 g in dextrose 5 % 50 mL IVPB     2 g 100 mL/hr over 30 Minutes Intravenous Every 4 hours 06/18/16 2027 06/19/16 1741   06/18/16 1700  nafcillin injection 2 g  Status:  Discontinued     2 g Intravenous Every 4 hours 06/18/16 1641 06/18/16 1650   06/18/16 1700  nafcillin 2 g in dextrose 5 % 100 mL IVPB  Status:  Discontinued     2 g 200 mL/hr over 30 Minutes Intravenous Every 4 hours 06/18/16 1650 06/18/16 2032   06/18/16 1600  fluconazole (DIFLUCAN) tablet 100 mg     100 mg Oral Daily 06/18/16 1516     06/18/16 0400  piperacillin-tazobactam (ZOSYN) IVPB 3.375 g  Status:  Discontinued     3.375 g 12.5 mL/hr over 240 Minutes Intravenous Every 8 hours 06/17/16 2103 06/18/16 1556   06/17/16 2045  piperacillin-tazobactam (ZOSYN) IVPB 3.375 g     3.375 g 100 mL/hr over 30 Minutes Intravenous  Once 06/17/16 2032 06/17/16 2152   06/17/16 2045  vancomycin (VANCOCIN) IVPB 1000 mg/200 mL premix     1,000 mg 200 mL/hr over 60 Minutes Intravenous  Once 06/17/16 2032 06/17/16 2334      Subjective:   [name] seen and examined today.  Patient has no complaints today. Denies chest pain, shortness of breath, abdominal pain, nausea, vomiting, diarrhea,  constipation.   Objective:   Vitals:   06/19/16 2346 06/20/16 0009 06/20/16 0400 06/20/16 0527  BP:  (!) 142/77 (!) 152/85   Pulse:   (!) 50   Resp:  (!) 23 (!) 23   Temp: 97.7 F (36.5 C)   97.7 F (36.5 C)  TempSrc: Oral   Oral  SpO2:  98% 97%   Weight:    93.7 kg (206 lb 9.1 oz)  Height:        Intake/Output Summary (Last 24 hours) at 06/20/16 1441 Last data filed at 06/20/16 0641  Gross per 24 hour  Intake             2400 ml  Output             1300 ml  Net             1100 ml   Filed Weights   06/17/16 1931 06/18/16 0056 06/20/16 0527  Weight: 81.6 kg (180 lb) 90.1 kg (198 lb 10.2 oz) 93.7 kg (206 lb 9.1 oz)    Exam (no change from previous days)  General: Well  developed, well nourished, no apparent distress  HEENT: NCAT, mucous membranes moist, poor dentition.   Cardiovascular: S1 S2 auscultated, RRR, 2/6 SEM  Respiratory: Diminished breath sounds.   Abdomen: Soft, nontender, nondistended, + bowel sounds  Extremities: warm dry without cyanosis clubbing or edema  Neuro: AAOx2 (self and time), nonfocal  Skin: Groin erythema, flaky skin (likely fungal infection)  Psych: Appropriate mood and affect, pleasant    Data Reviewed: I have personally reviewed following labs and imaging studies  CBC:  Recent Labs Lab 06/17/16 2012 06/18/16 0812 06/19/16 0334 06/20/16 0351  WBC 20.1* 18.7* 20.6* 14.5*  NEUTROABS 15.7*  --   --   --   HGB 13.9 12.7* 13.2 11.9*  HCT 42.6 38.7* 39.8 37.1*  MCV 86.8 85.6 86.0 86.3  PLT 273 249 268 683   Basic Metabolic Panel:  Recent Labs Lab 06/17/16 2012 06/18/16 0812 06/19/16 0334 06/20/16 0351  NA 137 140 141 143  K 4.5 4.0 3.6 3.1*  CL 104 109 110 114*  CO2 22 21* 22 22  GLUCOSE 411* 302* 209* 116*  BUN 21* 20 20 23*  CREATININE 1.87* 1.61* 1.71* 2.03*  CALCIUM 8.7* 8.0* 8.1* 7.6*   GFR: Estimated Creatinine Clearance: 28.3 mL/min (A) (by C-G formula based on SCr of 2.03 mg/dL (H)). Liver Function  Tests:  Recent Labs Lab 06/17/16 2012  AST 21  ALT 8*  ALKPHOS 70  BILITOT 0.6  PROT 7.1  ALBUMIN 3.3*   No results for input(s): LIPASE, AMYLASE in the last 168 hours. No results for input(s): AMMONIA in the last 168 hours. Coagulation Profile:  Recent Labs Lab 06/18/16 0042  INR 1.29   Cardiac Enzymes: No results for input(s): CKTOTAL, CKMB, CKMBINDEX, TROPONINI in the last 168 hours. BNP (last 3 results) No results for input(s): PROBNP in the last 8760 hours. HbA1C: No results for input(s): HGBA1C in the last 72 hours. CBG:  Recent Labs Lab 06/19/16 1225 06/19/16 1551 06/19/16 2030 06/20/16 0755 06/20/16 1243  GLUCAP 176* 172* 101* 115* 127*   Lipid Profile: No results for input(s): CHOL, HDL, LDLCALC, TRIG, CHOLHDL, LDLDIRECT in the last 72 hours. Thyroid Function Tests: No results for input(s): TSH, T4TOTAL, FREET4, T3FREE, THYROIDAB in the last 72 hours. Anemia Panel: No results for input(s): VITAMINB12, FOLATE, FERRITIN, TIBC, IRON, RETICCTPCT in the last 72 hours. Urine analysis:    Component Value Date/Time   COLORURINE YELLOW 06/17/2016 1959   APPEARANCEUR CLEAR 06/17/2016 1959   LABSPEC 1.020 06/17/2016 1959   PHURINE 5.0 06/17/2016 1959   GLUCOSEU >=500 (A) 06/17/2016 1959   HGBUR SMALL (A) 06/17/2016 1959   BILIRUBINUR NEGATIVE 06/17/2016 1959   KETONESUR 5 (A) 06/17/2016 1959   PROTEINUR 30 (A) 06/17/2016 1959   UROBILINOGEN 0.2 05/18/2014 1634   NITRITE NEGATIVE 06/17/2016 1959   LEUKOCYTESUR NEGATIVE 06/17/2016 1959   Sepsis Labs: @LABRCNTIP (procalcitonin:4,lacticidven:4)  ) Recent Results (from the past 240 hour(s))  Blood Culture (routine x 2)     Status: Abnormal   Collection Time: 06/17/16  7:52 PM  Result Value Ref Range Status   Specimen Description BLOOD BLOOD LEFT FOREARM  Final   Special Requests IN PEDIATRIC BOTTLE Blood Culture adequate volume  Final   Culture  Setup Time   Final    GRAM POSITIVE COCCI IN CLUSTERS IN  PEDIATRIC BOTTLE CRITICAL VALUE NOTED.  VALUE IS CONSISTENT WITH PREVIOUSLY REPORTED AND CALLED VALUE.    Culture (A)  Final    STAPHYLOCOCCUS SPECIES (COAGULASE NEGATIVE) THE SIGNIFICANCE OF  ISOLATING THIS ORGANISM FROM A SINGLE SET OF BLOOD CULTURES WHEN MULTIPLE SETS ARE DRAWN IS UNCERTAIN. PLEASE NOTIFY THE MICROBIOLOGY DEPARTMENT WITHIN ONE WEEK IF SPECIATION AND SENSITIVITIES ARE REQUIRED. Performed at [facility], [place] [address] [place], [place] [address]    Report Status 06/19/2016 FINAL  Final  Blood Culture (routine x 2)     Status: Abnormal (Preliminary result)   Collection Time: 06/17/16  8:12 PM  Result Value Ref Range Status   Specimen Description BLOOD RIGHT ANTECUBITAL  Final   Special Requests   Final    BOTTLES DRAWN AEROBIC AND ANAEROBIC Blood Culture results may not be optimal due to an inadequate volume of blood received in culture bottles   Culture  Setup Time   Final    GRAM POSITIVE COCCI IN CHAINS IN BOTH AEROBIC AND ANAEROBIC BOTTLES CRITICAL RESULT CALLED TO, READ BACK BY AND VERIFIED WITH: N. Glogovac Pharm.D. 15:45 06/18/16 ([email or web address])    Culture (A)  Final    STAPHYLOCOCCUS AUREUS ENTEROCOCCUS FAECALIS SUSCEPTIBILITIES TO FOLLOW Performed at [facility], [place] [address] [place], [place] [address]    Report Status PENDING  Incomplete  Blood Culture ID Panel (Reflexed)     Status: Abnormal   Collection Time: 06/17/16  8:12 PM  Result Value Ref Range Status   Enterococcus species DETECTED (A) NOT DETECTED Final    Comment: CRITICAL RESULT CALLED TO, READ BACK BY AND VERIFIED WITH: N. Glogovac Pharm.D. 15:45 06/18/16 ([email or web address])    Vancomycin resistance NOT DETECTED NOT DETECTED Final   Listeria monocytogenes NOT DETECTED NOT DETECTED Final   Staphylococcus species DETECTED (A) NOT DETECTED Final    Comment: CRITICAL RESULT CALLED TO, READ BACK BY AND VERIFIED WITH: N. Glogovac Pharm.D. 15:45 06/18/16 ([email or web address])    Staphylococcus aureus DETECTED  (A) NOT DETECTED Final    Comment: Methicillin (oxacillin) susceptible Staphylococcus aureus (MSSA). Preferred therapy is anti staphylococcal beta lactam antibiotic (Cefazolin or Nafcillin), unless clinically contraindicated. CRITICAL RESULT CALLED TO, READ BACK BY AND VERIFIED WITH: N. Glogovac Pharm.D. 15:45 06/18/16 ([email or web address])    Methicillin resistance NOT DETECTED NOT DETECTED Final   Streptococcus species NOT DETECTED NOT DETECTED Final   Streptococcus agalactiae NOT DETECTED NOT DETECTED Final   Streptococcus pneumoniae NOT DETECTED NOT DETECTED Final   Streptococcus pyogenes NOT DETECTED NOT DETECTED Final   Acinetobacter baumannii NOT DETECTED NOT DETECTED Final   Enterobacteriaceae species NOT DETECTED NOT DETECTED Final   Enterobacter cloacae complex NOT DETECTED NOT DETECTED Final   Escherichia coli NOT DETECTED NOT DETECTED Final   Klebsiella oxytoca NOT DETECTED NOT DETECTED Final   Klebsiella pneumoniae NOT DETECTED NOT DETECTED Final   Proteus species NOT DETECTED NOT DETECTED Final   Serratia marcescens NOT DETECTED NOT DETECTED Final   Haemophilus influenzae NOT DETECTED NOT DETECTED Final   Neisseria meningitidis NOT DETECTED NOT DETECTED Final   Pseudomonas aeruginosa NOT DETECTED NOT DETECTED Final   Candida albicans NOT DETECTED NOT DETECTED Final   Candida glabrata NOT DETECTED NOT DETECTED Final   Candida krusei NOT DETECTED NOT DETECTED Final   Candida parapsilosis NOT DETECTED NOT DETECTED Final   Candida tropicalis NOT DETECTED NOT DETECTED Final    Comment: Performed at [facility], [address], [place], [place] [address]  MRSA PCR Screening     Status: Abnormal   Collection Time: 06/18/16  1:40 AM  Result Value Ref Range Status   MRSA by PCR POSITIVE (A) NEGATIVE Final    Comment:  The GeneXpert MRSA Assay (FDA approved for NASAL specimens only), is one component of a comprehensive MRSA colonization surveillance program. It is not intended  to diagnose MRSA infection nor to guide or monitor treatment for MRSA infections. RESULT CALLED TO, READ BACK BY AND VERIFIED WITH: C.SMITH,RN 2025 06/18/16 W.SHEA   Culture, respiratory (NON-Expectorated)     Status: None   Collection Time: 06/18/16  6:32 AM  Result Value Ref Range Status   Specimen Description TRACHEAL ASPIRATE  Final   Special Requests NONE  Final   Gram Stain   Final    ABUNDANT WBC PRESENT, PREDOMINANTLY PMN RARE SQUAMOUS EPITHELIAL CELLS PRESENT MODERATE GRAM POSITIVE RODS GRAM POSITIVE COCCI IN PAIRS    Culture   Final    Consistent with normal respiratory flora. Performed at [facility], [place] [address], [place], [place] [address]    Report Status 06/20/2016 FINAL  Final  Culture, blood (Routine X 2) w Reflex to ID Panel     Status: None (Preliminary result)   Collection Time: 06/18/16  6:16 PM  Result Value Ref Range Status   Specimen Description BLOOD RIGHT ARM  Final   Special Requests IN PEDIATRIC BOTTLE Blood Culture adequate volume  Final   Culture   Final    NO GROWTH 2 DAYS Performed at [facility], [place] [address] [place], [place] [address]    Report Status PENDING  Incomplete  Culture, blood (Routine X 2) w Reflex to ID Panel     Status: Abnormal (Preliminary result)   Collection Time: 06/18/16  6:22 PM  Result Value Ref Range Status   Specimen Description BLOOD RIGHT ANTECUBITAL  Final   Special Requests IN PEDIATRIC BOTTLE Blood Culture adequate volume  Final   Culture  Setup Time   Final    GRAM POSITIVE COCCI IN CLUSTERS IN PEDIATRIC BOTTLE Organism ID to follow CRITICAL RESULT CALLED TO, READ BACK BY AND VERIFIED WITHMarcy Siren PHARMD 1932 06/19/16 A BROWNING    Culture (A)  Final    STAPHYLOCOCCUS AUREUS SUSCEPTIBILITIES TO FOLLOW Performed at [facility], [place] [address], [place], [place] [address]    Report Status PENDING  Incomplete  Blood Culture ID Panel (Reflexed)     Status: Abnormal   Collection Time:  06/18/16  6:22 PM  Result Value Ref Range Status   Enterococcus species NOT DETECTED NOT DETECTED Final   Listeria monocytogenes NOT DETECTED NOT DETECTED Final   Staphylococcus species DETECTED (A) NOT DETECTED Final    Comment: CRITICAL RESULT CALLED TO, READ BACK BY AND VERIFIED WITHMarcy Siren PHARMD 1932 06/19/16 A BROWNING    Staphylococcus aureus DETECTED (A) NOT DETECTED Final    Comment: Methicillin (oxacillin) susceptible Staphylococcus aureus (MSSA). Preferred therapy is anti staphylococcal beta lactam antibiotic (Cefazolin or Nafcillin), unless clinically contraindicated. CRITICAL RESULT CALLED TO, READ BACK BY AND VERIFIED WITHMarcy Siren PHARMD 1932 06/19/16 A BROWNING    Methicillin resistance NOT DETECTED NOT DETECTED Final   Streptococcus species NOT DETECTED NOT DETECTED Final   Streptococcus agalactiae NOT DETECTED NOT DETECTED Final   Streptococcus pneumoniae NOT DETECTED NOT DETECTED Final   Streptococcus pyogenes NOT DETECTED NOT DETECTED Final   Acinetobacter baumannii NOT DETECTED NOT DETECTED Final   Enterobacteriaceae species NOT DETECTED NOT DETECTED Final   Enterobacter cloacae complex NOT DETECTED NOT DETECTED Final   Escherichia coli NOT DETECTED NOT DETECTED Final   Klebsiella oxytoca NOT DETECTED NOT DETECTED Final   Klebsiella pneumoniae  NOT DETECTED NOT DETECTED Final   Proteus species NOT DETECTED NOT DETECTED Final   Serratia marcescens NOT DETECTED NOT DETECTED Final   Haemophilus influenzae NOT DETECTED NOT DETECTED Final   Neisseria meningitidis NOT DETECTED NOT DETECTED Final   Pseudomonas aeruginosa NOT DETECTED NOT DETECTED Final   Candida albicans NOT DETECTED NOT DETECTED Final   Candida glabrata NOT DETECTED NOT DETECTED Final   Candida krusei NOT DETECTED NOT DETECTED Final   Candida parapsilosis NOT DETECTED NOT DETECTED Final   Candida tropicalis NOT DETECTED NOT DETECTED Final    Comment: Performed at [facility], [place] [address], [place], [place] [address]  Culture, blood (Routine X 2) w Reflex to ID Panel     Status: None (Preliminary result)   Collection Time: 06/19/16 11:08 AM  Result Value Ref Range Status   Specimen Description BLOOD RIGHT ARM  Final   Special Requests IN PEDIATRIC BOTTLE Blood Culture adequate volume  Final   Culture   Final    NO GROWTH < 24 HOURS Performed at [facility], [place] [address], [place], [place] [address]    Report Status PENDING  Incomplete  Culture, blood (Routine X 2) w Reflex to ID Panel     Status: None (Preliminary result)   Collection Time: 06/19/16 11:08 AM  Result Value Ref Range Status   Specimen Description BLOOD RIGHT HAND  Final   Special Requests IN PEDIATRIC BOTTLE Blood Culture adequate volume  Final   Culture   Final    NO GROWTH < 24 HOURS Performed at [facility], [place] [address] [place], [place] [address]    Report Status PENDING  Incomplete  Body fluid culture     Status: None (Preliminary result)   Collection Time: 06/19/16 12:25 PM  Result Value Ref Range Status   Specimen Description SYNOVIAL RIGHT KNEE  Final   Special Requests NONE  Final   Gram Stain   Final    MODERATE WBC PRESENT,BOTH PMN AND MONONUCLEAR FEW GRAM POSITIVE COCCI IN PAIRS    Culture   Final    MODERATE STAPHYLOCOCCUS AUREUS SUSCEPTIBILITIES TO FOLLOW Performed at [facility], [place] [address] [place], [place] [address]    Report Status PENDING  Incomplete      Radiology Studies: Dg Swallowing Func-speech Pathology  Result Date: 06/19/2016 Objective Swallowing Evaluation: Type of Study: MBS-Modified Barium Swallow Study Patient Details Name: ERBY SANDERSON MRN: [id number] Date of Birth: 1924/10/07 Today's Date: 06/19/2016 Time: SLP Start Time (ACUTE ONLY): 1520-SLP Stop Time (ACUTE ONLY): 1545 SLP Time Calculation (min) (ACUTE ONLY): 25 min Past Medical History: Past Medical History: Diagnosis Date . Anxiety  . Arthritis   R knee,  . CKD (chronic kidney  disease)  . Coronary artery disease  . Diabetes mellitus without complication ([place])  . Dysrhythmia   PAF;,sss s/p St. Jude PPM . Hyperlipidemia  . Hypertension  . MI (myocardial infarction) ([place]) 2014 . Presence of permanent cardiac pacemaker  Past Surgical History: Past Surgical History: Procedure Laterality Date . CHOLECYSTECTOMY   . CORONARY ANGIOPLASTY    mLAD '13, RCA DES 03/2013 . EXCISIONAL TOTAL KNEE ARTHROPLASTY WITH ANTIBIOTIC SPACERS Right 04/20/2014  Procedure: IRRIGATION AND DEBRIDMENT RIGHT TOTAL KNEE REMOVE  ACL IMPLANTED AND PLACE SPACER;  Surgeon: [name], MD;  Location: [facility];  Service: Orthopedics;  Laterality: Right; . HEMORROIDECTOMY   . KNEE SURGERY  2006 . PACEMAKER INSERTION   . PICC LINE PLACE PERIPHERAL [facility]  HX)  04/2014  R upper arm  . TOTAL KNEE ARTHROPLASTY WITH REVISION COMPONENTS Right 06/11/2014  Procedure: TOTAL KNEE ARTHROPLASTY WITH REVISION COMPONENTS REMOVE SPACER PLACE TKA;  Surgeon: [name], MD;  Location: [facility];  Service: Orthopedics;  Laterality: Right; HPI: pt admit to [facility] with AMS, cough- found to be bacterial septic.  PMH + for DM, MI, no CVA hx. CT chest and CXR negative.  Swallow eval ordered due to pt having dysphagia.  Subjective: pt awake in chair Assessment / Plan / Recommendation CHL IP CLINICAL IMPRESSIONS 06/19/2016 Clinical Impression Pt presents with severe pharyngeal dysphagia - narrow pharynx and decreased tongue base retraction results in very poor epiglottic deflection and gross pharyngeal/vallecular residuals.  Pt requires multiple swallows (up to 5-6) with each bolus and still has vallecular residuals. He did not aspirate or penetrate surprisingly.  Residuals were much worse with solids/pudding than liquids.  Various strategies/postures including head turn left, multiple swallows, following solid with liquids mitigate dysphagia.  Pt did not fully clear vallecular space but can fortunately hock and expectorate to clear.  Pt reports his swallow ability  during MBS is "normal" for him causing SLP to suspect baseline significant dysphagia that he has been managing.  Recommend strict precautions to mitigate aspiration risk and modify diet to full liquids. Using teach back, pt able to demonstrate multiple swallows, "hocking" and oral suctioning independently.  Will follow for po tolerance, readiness for dietary advancement.   SLP Visit Diagnosis Dysphagia, oropharyngeal phase (R13.12) Attention and concentration deficit following -- Frontal lobe and executive function deficit following -- Impact on safety and function Severe aspiration risk;Risk for inadequate nutrition/hydration   CHL IP TREATMENT RECOMMENDATION 06/19/2016 Treatment Recommendations Therapy as outlined in treatment plan below   Prognosis 06/19/2016 Prognosis for Safe Diet Advancement Fair Barriers to Reach Goals Time post onset Barriers/Prognosis Comment -- CHL IP DIET RECOMMENDATION 06/19/2016 SLP Diet Recommendations Nectar thick liquid;Thin liquid Liquid Administration via Cup;Straw Medication Administration (No Data) Compensations Slow rate;Small sips/bites;Follow solids with liquid;Multiple dry swallows after each bite/sip Postural Changes Seated upright at 90 degrees;Remain semi-upright after after feeds/meals (Comment)   CHL IP OTHER RECOMMENDATIONS 06/19/2016 Recommended Consults -- Oral Care Recommendations Oral care before and after PO Other Recommendations --   CHL IP FOLLOW UP RECOMMENDATIONS 06/19/2016 Follow up Recommendations (No Data)   CHL IP FREQUENCY AND DURATION 06/19/2016 Speech Therapy Frequency (ACUTE ONLY) min 2x/week Treatment Duration 1 week      CHL IP ORAL PHASE 06/19/2016 Oral Phase WFL Oral - Pudding Teaspoon -- Oral - Pudding Cup -- Oral - Honey Teaspoon -- Oral - Honey Cup -- Oral - Nectar Teaspoon -- Oral - Nectar Cup WFL Oral - Nectar Straw WFL Oral - Thin Teaspoon -- Oral - Thin Cup WFL Oral - Thin Straw WFL Oral - Puree WFL Oral - Mech Soft WFL Oral - Regular WFL Oral -  Multi-Consistency -- Oral - Pill -- Oral Phase - Comment --  CHL IP PHARYNGEAL PHASE 06/19/2016 Pharyngeal Phase Impaired Pharyngeal- Pudding Teaspoon -- Pharyngeal -- Pharyngeal- Pudding Cup -- Pharyngeal -- Pharyngeal- Honey Teaspoon -- Pharyngeal -- Pharyngeal- Honey Cup -- Pharyngeal -- Pharyngeal- Nectar Teaspoon -- Pharyngeal -- Pharyngeal- Nectar Cup Reduced pharyngeal peristalsis;Pharyngeal residue - valleculae;Reduced tongue base retraction Pharyngeal -- Pharyngeal- Nectar Straw Reduced pharyngeal peristalsis;Reduced tongue base retraction;Pharyngeal residue - valleculae Pharyngeal -- Pharyngeal- Thin Teaspoon -- Pharyngeal -- Pharyngeal- Thin Cup Reduced epiglottic inversion;Reduced tongue base retraction;Reduced pharyngeal peristalsis Pharyngeal -- Pharyngeal- Thin Straw Reduced pharyngeal peristalsis;Reduced epiglottic inversion;Pharyngeal  residue - valleculae Pharyngeal -- Pharyngeal- Puree Reduced epiglottic inversion;Reduced pharyngeal peristalsis;Pharyngeal residue - valleculae Pharyngeal -- Pharyngeal- Mechanical Soft -- Pharyngeal -- Pharyngeal- Regular Reduced pharyngeal peristalsis;Reduced epiglottic inversion;Reduced tongue base retraction;Pharyngeal residue - valleculae Pharyngeal -- Pharyngeal- Multi-consistency -- Pharyngeal -- Pharyngeal- Pill -- Pharyngeal -- Pharyngeal Comment pt without awareness to mild leftover vallecular residuals, he is able to clear/expectorate with cues   [facility] IP CERVICAL ESOPHAGEAL PHASE 06/19/2016 Cervical Esophageal Phase WFL Pudding Teaspoon -- Pudding Cup -- Honey Teaspoon -- Honey Cup -- Nectar Teaspoon -- Nectar Cup -- Nectar Straw -- Thin Teaspoon -- Thin Cup -- Thin Straw -- Puree -- Mechanical Soft -- Regular -- Multi-consistency -- Pill -- Cervical Esophageal Comment -- No flowsheet data found. Luanna Salk, MS [facility] SLP [phone]                Scheduled Meds: . chlorhexidine  15 mL Mouth Rinse BID  . Chlorhexidine Gluconate Cloth  6 each Topical Q0600    . enoxaparin (LOVENOX) injection  30 mg Subcutaneous Q24H  . fluconazole  100 mg Oral Daily  . insulin aspart  0-20 Units Subcutaneous TID WC  . insulin aspart  0-5 Units Subcutaneous QHS  . insulin detemir  5 Units Subcutaneous BID  . liver oil-zinc oxide   Topical BID  . mouth rinse  15 mL Mouth Rinse q12n4p  . mupirocin ointment  1 application Nasal BID  . nystatin   Topical TID   Continuous Infusions: . sodium chloride 1,000 mL (06/20/16 1114)  . nafcillin IV Stopped (06/20/16 1122)  . vancomycin Stopped (06/19/16 2305)     LOS: 3 days   Time Spent in minutes   30 minutes  Koben Daman D.O. on 06/20/2016 at 2:41 PM  Between 7am to 7pm - Pager - [phone]  After 7pm go to [email or web address] - password [email or web address]  And look for the night coverage person covering for me after hours  [facility] Office  [phone]

## 2016-06-20 NOTE — Progress Notes (Addendum)
Patient ID: Randy [name], male   DOB: May 18, 1924, 83 y.o.   MRN: [id number]          [facility]    Date of Admission:  06/17/2016    Day 4 Antibiotics         [name] has right prosthetic knee infection and bacteremia due to enterococcus, staph aureus and coag negative staph. The enterococcal susceptibilities remain pending. I will continue nafcillin and vancomycin for now. His transthoracic echocardiogram did not reveal any vegetations on his native valves or permanent pacemaker wire. He is scheduled for TEE on Monday.         Michel Bickers, MD [facility] [place] [facility] [phone] pager   [phone] cell 06/20/2016, 10:17 AM

## 2016-06-20 NOTE — Progress Notes (Signed)
Alert today.  Antibiotics adjusted by ID.  Aspirate yesterday showed 170k WBC and culture thus far is GPC in pairs.  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  Scheduled for TEE on Monday and cannot place central lines safely for a few more days so AKA surgery will be next week at some point.  Discussed with patient and son, [name], is aware.

## 2016-06-21 DIAGNOSIS — G301 Alzheimer's disease with late onset: Secondary | ICD-10-CM

## 2016-06-21 DIAGNOSIS — F0280 Dementia in other diseases classified elsewhere without behavioral disturbance: Secondary | ICD-10-CM

## 2016-06-21 LAB — GLUCOSE, CAPILLARY
GLUCOSE-CAPILLARY: 134 mg/dL — AB (ref 65–99)
GLUCOSE-CAPILLARY: 159 mg/dL — AB (ref 65–99)
Glucose-Capillary: 216 mg/dL — ABNORMAL HIGH (ref 65–99)
Glucose-Capillary: 161 mg/dL — ABNORMAL HIGH (ref 65–99)

## 2016-06-21 LAB — CBC
HCT: 36.9 % — ABNORMAL LOW (ref 39.0–52.0)
Hemoglobin: 12 g/dL — ABNORMAL LOW (ref 13.0–17.0)
MCH: 27.7 pg (ref 26.0–34.0)
MCHC: 32.5 g/dL (ref 30.0–36.0)
MCV: 85.2 fL (ref 78.0–100.0)
Platelets: 291 10*3/uL (ref 150–400)
RBC: 4.33 MIL/uL (ref 4.22–5.81)
RDW: 15.9 % — AB (ref 11.5–15.5)
WBC: 11.5 10*3/uL — AB (ref 4.0–10.5)

## 2016-06-21 LAB — CULTURE, BLOOD (ROUTINE X 2): Special Requests: ADEQUATE

## 2016-06-21 LAB — BASIC METABOLIC PANEL
Anion gap: 8 (ref 5–15)
BUN: 21 mg/dL — ABNORMAL HIGH (ref 6–20)
CALCIUM: 7.2 mg/dL — AB (ref 8.9–10.3)
CO2: 19 mmol/L — ABNORMAL LOW (ref 22–32)
CREATININE: 1.79 mg/dL — AB (ref 0.61–1.24)
Chloride: 114 mmol/L — ABNORMAL HIGH (ref 101–111)
GFR, EST AFRICAN AMERICAN: 37 mL/min — AB (ref >60)
GFR, EST NON AFRICAN AMERICAN: 32 mL/min — AB (ref >60)
Glucose, Bld: 164 mg/dL — ABNORMAL HIGH (ref 65–99)
Potassium: 3 mmol/L — ABNORMAL LOW (ref 3.5–5.1)
SODIUM: 141 mmol/L (ref 135–145)

## 2016-06-21 MED ORDER — POTASSIUM CHLORIDE 10 MEQ/100ML IV SOLN
10.0000 meq | INTRAVENOUS | Status: AC
  Administered 2016-06-21 (×2): 10 meq via INTRAVENOUS
  Filled 2016-06-21 (×4): qty 100

## 2016-06-21 MED ORDER — HYDRALAZINE HCL 20 MG/ML IJ SOLN: 10.0000 mg | Freq: Four times a day (QID) | INTRAMUSCULAR | Status: DC | PRN

## 2016-06-21 MED ORDER — POTASSIUM CHLORIDE 20 MEQ/15ML (10%) PO SOLN
40.0000 meq | Freq: Every day | ORAL | Status: DC
  Administered 2016-06-21: 40 meq via ORAL
  Filled 2016-06-21: qty 30

## 2016-06-21 MED ORDER — POTASSIUM CHLORIDE 10 MEQ/100ML IV SOLN
10.0000 meq | INTRAVENOUS | Status: AC
  Administered 2016-06-21 (×2): 10 meq via INTRAVENOUS
  Filled 2016-06-21 (×2): qty 100

## 2016-06-21 NOTE — Progress Notes (Signed)
Patient ID: Randy [name], male   DOB: 10-May-1924, 83 y.o.   MRN: [id number]         [place] [facility]    Date of Admission:  06/17/2016    Day 5 Antibiotics         Mr. Marullo has right prosthetic knee infection and bacteremia due to ampicillin sensitive enterococcus, MSSA and coag negative staph. I will narrow antibiotic therapy to ampicillin sulbactam. He is scheduled for TEE tomorrow.         Michel Bickers, MD [facility] for [facility] [place] [facility] [phone] pager   [phone] cell 06/20/2016, 10:17 AM

## 2016-06-21 NOTE — Progress Notes (Signed)
PROGRESS NOTE    Randy [name]  QVZ:[id number] DOB: [birth year] DOA: 06/17/2016 PCP: System, Pcp Not In   Chief Complaint  Patient presents with  . Altered Mental Status  . Hyperglycemia  . Fever    Brief Narrative:  HPI on 06/17/2016 by Dr. Lily Kocher Randy [name] is a 83 y.o. gentleman with a history of mild dementia/cognitive impairment, CAD with prior stent placement, history of atrial fibrillation S/P PPM implant (CHADS-Vasc score of at least four, no anticoagulation), HTN, HLD, DM, and CKD 3 with baseline creatinine around 1.3-1.5 who was referred to the ED via EMS by one of his caregivers for evaluation of increased lethargy, mental status changes, and cough.  The patient is nonambulatory at baseline and spends most of his day sitting up a recliner.  He has caregiver support 24 hours/day.  He has had cough for 4-5 days.  He has had two known sick contacts (son-in-law and caregiver).  He has had yellow sputum.  No chest pain or shortness of breath.  No nausea or vomiting.  No abdominal pain.  No dysuria.  No diarrhea. Today, he was noted to have increased lethargy and muffled speech with slurring.  No other focal weakenss.  911 was called and the patient was transferred to the ED for evaluation. No recent hospitalizations.  No recent antibiotics.  He has been using OTC mucinex for his symptoms. No overt signs or symptoms of aspiration.  Interim history 1/2 blood cultures +enterococcus and MSSA. ID consulted. TEE planned for 06/22/2016. Currently on vancomycin. Patient also has infected knee, ortho consulted.  Assessment & Plan   Sepsis secondary to MSSA/Enterococcus bacteremia/Right prosthetic knee infection -Suspected respiratory source vs groin cellulitis vs bacteremia -Presented with fever, tachycardia, tachypnea, leukocytosis -Patient also had elevated pro calcitonin level as well as lactic acid -Initially placed on  vancomycin and zosyn, zosyn discontinued on 06/18/16 -Continue  vancomycin -Chest x-ray showed no cardiopulmonary process -CT chest showed subpleural fibrosis, no acute pulmonary infiltrates. -Influenza PCR negative -Strep pneumonia urine antigen negative -UA unremarkable for infection -Blood cultures 06/17/2016: MSSA, enterococcus  -Infectious disease consulted and appreciated, recommended TEE and EP consult -Cardiology consulted and appreciated, plan for TEE on 06/22/2016 -Echocardiogram showed EF 55-60% -Repeat blood cultures 06/19/2016 show no growth to date -orthopedic consulted and appreciated (Dr. Mayer Camel, as patient does have RTKA (2016) and has had infection before) -s/p aspiration of the right knee joint showed moderate Staph Aureus (pansensitive) -Plan would be for possible AKA.  Essential hypertension -Currently on no home medications -Continue to monitor, will add IV hydralazine PRN  Diabetes mellitus, type II -Currently on no home medications -Continue insulin sliding scale, Levemir, CBG monitoring  History of atrial fibrillation -CHADSVASC 4 -Currently on no anticoagulation -Status post pacemaker placement- cardiology consulted as patient does have bacteremia  Acute on Chronic kidney disease, stage III -Creatinine baseline at 1.5 -Currently creatinine 1.79 -Continue to monitor BMP  Candidiasis  -Groin area appears to be erythematous and flaky -Continue diflucan  Stage II pressure injury -moisture related -wound care consulted and appreciated  Moderate aortic stenosis -Noted on echocardiogram, TEE planned 4 06/22/2016  Possible Dementia -Per son patient can sometimes be clear minded but is not always  Nutrition -Speech consulted and recommended nectar thick liquid -Will ask speech to reevaluate and see if patient's diet can be advanced.  Right lower extremity edema -Likely secondary to infected TKR -Will discontinue IVF as edema does appear to be worsening today  Hypokalemia -Will replace and  continue to monitor  BMP  DVT Prophylaxis  lovenox  Code Status: Full  Family Communication: None at bedside.   Disposition Plan: Admitted. Continue to monitor in stepdown. Pending TEE on 06/22/2016  Consultants Infectious disease Cardiology Orthopedics  Procedures  Echocardiogram Right knee aspiration   Antibiotics   Anti-infectives    Start     Dose/Rate Route Frequency Ordered Stop   06/19/16 2100  nafcillin 2 g in dextrose 5 % 100 mL IVPB     2 g 200 mL/hr over 30 Minutes Intravenous Every 4 hours 06/18/16 2032     06/18/16 2200  vancomycin (VANCOCIN) IVPB 1000 mg/200 mL premix     1,000 mg 200 mL/hr over 60 Minutes Intravenous Every 24 hours 06/18/16 1702     06/18/16 2100  vancomycin (VANCOCIN) IVPB 750 mg/150 ml premix  Status:  Discontinued     750 mg 150 mL/hr over 60 Minutes Intravenous Every 24 hours 06/17/16 2103 06/17/16 2103   06/18/16 2100  vancomycin (VANCOCIN) IVPB 1000 mg/200 mL premix  Status:  Discontinued     1,000 mg 200 mL/hr over 60 Minutes Intravenous Every 24 hours 06/17/16 2103 06/18/16 1638   06/18/16 2100  nafcillin IVPB 2 g  Status:  Discontinued     2 g 200 mL/hr over 30 Minutes Intravenous Every 4 hours 06/18/16 2025 06/18/16 2026   06/18/16 2100  nafcillin 2 g in dextrose 5 % 50 mL IVPB     2 g 100 mL/hr over 30 Minutes Intravenous Every 4 hours 06/18/16 2027 06/19/16 1741   06/18/16 1700  nafcillin injection 2 g  Status:  Discontinued     2 g Intravenous Every 4 hours 06/18/16 1641 06/18/16 1650   06/18/16 1700  nafcillin 2 g in dextrose 5 % 100 mL IVPB  Status:  Discontinued     2 g 200 mL/hr over 30 Minutes Intravenous Every 4 hours 06/18/16 1650 06/18/16 2032   06/18/16 1600  fluconazole (DIFLUCAN) tablet 100 mg     100 mg Oral Daily 06/18/16 1516     06/18/16 0400  piperacillin-tazobactam (ZOSYN) IVPB 3.375 g  Status:  Discontinued     3.375 g 12.5 mL/hr over 240 Minutes Intravenous Every 8 hours 06/17/16 2103 06/18/16 1556   06/17/16 2045   piperacillin-tazobactam (ZOSYN) IVPB 3.375 g     3.375 g 100 mL/hr over 30 Minutes Intravenous  Once 06/17/16 2032 06/17/16 2152   06/17/16 2045  vancomycin (VANCOCIN) IVPB 1000 mg/200 mL premix     1,000 mg 200 mL/hr over 60 Minutes Intravenous  Once 06/17/16 2032 06/17/16 2334      Subjective:   Bora Broner seen and examined today.  Patient has no complaints today, would like to eat eggs and bacon. Denies chest pain, shortness of breath, abdominal pain, nausea, vomiting, diarrhea, constipation.   Objective:   Vitals:   06/21/16 0000 06/21/16 0345 06/21/16 0400 06/21/16 0800  BP: (!) 126/46  (!) 140/114   Pulse:      Resp: 16  (!) 25   Temp:  97.3 F (36.3 C)  97.7 F (36.5 C)  TempSrc:  Oral  Oral  SpO2: 92%  98%   Weight:      Height:        Intake/Output Summary (Last 24 hours) at 06/21/16 1027 Last data filed at 06/21/16 0600  Gross per 24 hour  Intake             3760 ml  Output  1350 ml  Net             2410 ml   Filed Weights   06/17/16 1931 06/18/16 0056 06/20/16 0527  Weight: 81.6 kg (180 lb) 90.1 kg (198 lb 10.2 oz) 93.7 kg (206 lb 9.1 oz)    Exam   General: Well developed, well nourished, no apparent distress  HEENT: NCAT, mucous membranes moist, poor dentition.   Cardiovascular: S1 S2 auscultated, RRR, 2/6 SEM  Respiratory: Diminished breath sounds.   Abdomen: Soft, nontender, nondistended, + bowel sounds  Extremities: RLE edema greater than LLE. Right knee TTP  Neuro: AAOx2 (self and time), nonfocal  Skin: Groin erythema, flaky skin (likely fungal infection)  Psych: Appropriate mood and affect, pleasant    Data Reviewed: I have personally reviewed following labs and imaging studies  CBC:  Recent Labs Lab 06/17/16 2012 06/18/16 0812 06/19/16 0334 06/20/16 0351 06/21/16 0334  WBC 20.1* 18.7* 20.6* 14.5* 11.5*  NEUTROABS 15.7*  --   --   --   --   HGB 13.9 12.7* 13.2 11.9* 12.0*  HCT 42.6 38.7* 39.8 37.1* 36.9*  MCV  86.8 85.6 86.0 86.3 85.2  PLT 273 249 268 279 932   Basic Metabolic Panel:  Recent Labs Lab 06/17/16 2012 06/18/16 0812 06/19/16 0334 06/20/16 0351 06/21/16 0334  NA 137 140 141 143 141  K 4.5 4.0 3.6 3.1* 3.0*  CL 104 109 110 114* 114*  CO2 22 21* 22 22 19*  GLUCOSE 411* 302* 209* 116* 164*  BUN 21* 20 20 23* 21*  CREATININE 1.87* 1.61* 1.71* 2.03* 1.79*  CALCIUM 8.7* 8.0* 8.1* 7.6* 7.2*   GFR: Estimated Creatinine Clearance: 32.1 mL/min (A) (by C-G formula based on SCr of 1.79 mg/dL (H)). Liver Function Tests:  Recent Labs Lab 06/17/16 2012  AST 21  ALT 8*  ALKPHOS 70  BILITOT 0.6  PROT 7.1  ALBUMIN 3.3*   No results for input(s): LIPASE, AMYLASE in the last 168 hours. No results for input(s): AMMONIA in the last 168 hours. Coagulation Profile:  Recent Labs Lab 06/18/16 0042  INR 1.29   Cardiac Enzymes: No results for input(s): CKTOTAL, CKMB, CKMBINDEX, TROPONINI in the last 168 hours. BNP (last 3 results) No results for input(s): PROBNP in the last 8760 hours. HbA1C: No results for input(s): HGBA1C in the last 72 hours. CBG:  Recent Labs Lab 06/20/16 0755 06/20/16 1243 06/20/16 1705 06/20/16 2150 06/21/16 0801  GLUCAP 115* 127* 163* 158* 134*   Lipid Profile: No results for input(s): CHOL, HDL, LDLCALC, TRIG, CHOLHDL, LDLDIRECT in the last 72 hours. Thyroid Function Tests: No results for input(s): TSH, T4TOTAL, FREET4, T3FREE, THYROIDAB in the last 72 hours. Anemia Panel: No results for input(s): VITAMINB12, FOLATE, FERRITIN, TIBC, IRON, RETICCTPCT in the last 72 hours. Urine analysis:    Component Value Date/Time   COLORURINE YELLOW 06/17/2016 1959   APPEARANCEUR CLEAR 06/17/2016 1959   LABSPEC 1.020 06/17/2016 1959   PHURINE 5.0 06/17/2016 1959   GLUCOSEU >=500 (A) 06/17/2016 1959   HGBUR SMALL (A) 06/17/2016 1959   BILIRUBINUR NEGATIVE 06/17/2016 1959   KETONESUR 5 (A) 06/17/2016 1959   PROTEINUR 30 (A) 06/17/2016 1959    UROBILINOGEN 0.2 05/18/2014 1634   NITRITE NEGATIVE 06/17/2016 1959   LEUKOCYTESUR NEGATIVE 06/17/2016 1959   Sepsis Labs: @LABRCNTIP (procalcitonin:4,lacticidven:4)  ) Recent Results (from the past 240 hour(s))  Blood Culture (routine x 2)     Status: Abnormal   Collection Time: 06/17/16  7:52 PM  Result  Value Ref Range Status   Specimen Description BLOOD BLOOD LEFT FOREARM  Final   Special Requests IN PEDIATRIC BOTTLE Blood Culture adequate volume  Final   Culture  Setup Time   Final    GRAM POSITIVE COCCI IN CLUSTERS IN PEDIATRIC BOTTLE CRITICAL VALUE NOTED.  VALUE IS CONSISTENT WITH PREVIOUSLY REPORTED AND CALLED VALUE.    Culture (A)  Final    STAPHYLOCOCCUS SPECIES (COAGULASE NEGATIVE) THE SIGNIFICANCE OF ISOLATING THIS ORGANISM FROM A SINGLE SET OF BLOOD CULTURES WHEN MULTIPLE SETS ARE DRAWN IS UNCERTAIN. PLEASE NOTIFY THE MICROBIOLOGY DEPARTMENT WITHIN ONE WEEK IF SPECIATION AND SENSITIVITIES ARE REQUIRED. Performed at [facility], [place] [address] [place], [place] [address]    Report Status 06/19/2016 FINAL  Final  Blood Culture (routine x 2)     Status: Abnormal   Collection Time: 06/17/16  8:12 PM  Result Value Ref Range Status   Specimen Description BLOOD RIGHT ANTECUBITAL  Final   Special Requests   Final    BOTTLES DRAWN AEROBIC AND ANAEROBIC Blood Culture results may not be optimal due to an inadequate volume of blood received in culture bottles   Culture  Setup Time   Final    GRAM POSITIVE COCCI IN CHAINS IN BOTH AEROBIC AND ANAEROBIC BOTTLES CRITICAL RESULT CALLED TO, READ BACK BY AND VERIFIED WITH: N. Glogovac Pharm.D. 15:45 06/18/16 ([email or web address]) Performed at [facility], [place] [address] [place], [place] [address]    Culture STAPHYLOCOCCUS AUREUS ENTEROCOCCUS FAECALIS  (A)  Final   Report Status 06/21/2016 FINAL  Final   Organism ID, Bacteria STAPHYLOCOCCUS AUREUS  Final   Organism ID, Bacteria ENTEROCOCCUS FAECALIS  Final      Susceptibility    Enterococcus faecalis - MIC*    AMPICILLIN <=2 SENSITIVE Sensitive     VANCOMYCIN <=0.5 SENSITIVE Sensitive     GENTAMICIN SYNERGY SENSITIVE Sensitive     * ENTEROCOCCUS FAECALIS   Staphylococcus aureus - MIC*    CIPROFLOXACIN <=0.5 SENSITIVE Sensitive     ERYTHROMYCIN <=0.25 SENSITIVE Sensitive     GENTAMICIN <=0.5 SENSITIVE Sensitive     OXACILLIN <=0.25 SENSITIVE Sensitive     TETRACYCLINE <=1 SENSITIVE Sensitive     VANCOMYCIN <=0.5 SENSITIVE Sensitive     TRIMETH/SULFA <=10 SENSITIVE Sensitive     CLINDAMYCIN <=0.25 SENSITIVE Sensitive     RIFAMPIN <=0.5 SENSITIVE Sensitive     Inducible Clindamycin NEGATIVE Sensitive     * STAPHYLOCOCCUS AUREUS  Blood Culture ID Panel (Reflexed)     Status: Abnormal   Collection Time: 06/17/16  8:12 PM  Result Value Ref Range Status   Enterococcus species DETECTED (A) NOT DETECTED Final    Comment: CRITICAL RESULT CALLED TO, READ BACK BY AND VERIFIED WITH: N. Glogovac Pharm.D. 15:45 06/18/16 ([email or web address])    Vancomycin resistance NOT DETECTED NOT DETECTED Final   Listeria monocytogenes NOT DETECTED NOT DETECTED Final   Staphylococcus species DETECTED (A) NOT DETECTED Final    Comment: CRITICAL RESULT CALLED TO, READ BACK BY AND VERIFIED WITH: N. Glogovac Pharm.D. 15:45 06/18/16 ([email or web address])    Staphylococcus aureus DETECTED (A) NOT DETECTED Final    Comment: Methicillin (oxacillin) susceptible Staphylococcus aureus (MSSA). Preferred therapy is anti staphylococcal beta lactam antibiotic (Cefazolin or Nafcillin), unless clinically contraindicated. CRITICAL RESULT CALLED TO, READ BACK BY AND VERIFIED WITH: N. Glogovac Pharm.D. 15:45 06/18/16 ([email or web address])    Methicillin resistance NOT DETECTED NOT DETECTED Final   Streptococcus species NOT DETECTED NOT DETECTED Final   Streptococcus agalactiae NOT  DETECTED NOT DETECTED Final   Streptococcus pneumoniae NOT DETECTED NOT DETECTED Final   Streptococcus pyogenes NOT DETECTED NOT DETECTED Final   Acinetobacter  baumannii NOT DETECTED NOT DETECTED Final   Enterobacteriaceae species NOT DETECTED NOT DETECTED Final   Enterobacter cloacae complex NOT DETECTED NOT DETECTED Final   Escherichia coli NOT DETECTED NOT DETECTED Final   Klebsiella oxytoca NOT DETECTED NOT DETECTED Final   Klebsiella pneumoniae NOT DETECTED NOT DETECTED Final   Proteus species NOT DETECTED NOT DETECTED Final   Serratia marcescens NOT DETECTED NOT DETECTED Final   Haemophilus influenzae NOT DETECTED NOT DETECTED Final   Neisseria meningitidis NOT DETECTED NOT DETECTED Final   Pseudomonas aeruginosa NOT DETECTED NOT DETECTED Final   Candida albicans NOT DETECTED NOT DETECTED Final   Candida glabrata NOT DETECTED NOT DETECTED Final   Candida krusei NOT DETECTED NOT DETECTED Final   Candida parapsilosis NOT DETECTED NOT DETECTED Final   Candida tropicalis NOT DETECTED NOT DETECTED Final    Comment: Performed at [facility], [place] [address], [place], [place] [address]  MRSA PCR Screening     Status: Abnormal   Collection Time: 06/18/16  1:40 AM  Result Value Ref Range Status   MRSA by PCR POSITIVE (A) NEGATIVE Final    Comment:        The GeneXpert MRSA Assay (FDA approved for NASAL specimens only), is one component of a comprehensive MRSA colonization surveillance program. It is not intended to diagnose MRSA infection nor to guide or monitor treatment for MRSA infections. RESULT CALLED TO, READ BACK BY AND VERIFIED WITH: C.SMITH,RN [id number] 06/18/16 W.SHEA   Culture, respiratory (NON-Expectorated)     Status: None   Collection Time: 06/18/16  6:32 AM  Result Value Ref Range Status   Specimen Description TRACHEAL ASPIRATE  Final   Special Requests NONE  Final   Gram Stain   Final    ABUNDANT WBC PRESENT, PREDOMINANTLY PMN RARE SQUAMOUS EPITHELIAL CELLS PRESENT MODERATE GRAM POSITIVE RODS GRAM POSITIVE COCCI IN PAIRS    Culture   Final    Consistent with normal respiratory flora. Performed at [facility], [place] [address], [place], [place] [address]    Report Status 06/20/2016 FINAL  Final  Culture, blood (Routine X 2) w Reflex to ID Panel     Status: None (Preliminary result)   Collection Time: 06/18/16  6:16 PM  Result Value Ref Range Status   Specimen Description BLOOD RIGHT ARM  Final   Special Requests IN PEDIATRIC BOTTLE Blood Culture adequate volume  Final   Culture   Final    NO GROWTH 2 DAYS Performed at [facility], [place] [address] [place], [place] [address]    Report Status PENDING  Incomplete  Culture, blood (Routine X 2) w Reflex to ID Panel     Status: Abnormal   Collection Time: 06/18/16  6:22 PM  Result Value Ref Range Status   Specimen Description BLOOD RIGHT ANTECUBITAL  Final   Special Requests IN PEDIATRIC BOTTLE Blood Culture adequate volume  Final   Culture  Setup Time   Final    GRAM POSITIVE COCCI IN CLUSTERS IN PEDIATRIC BOTTLE CRITICAL RESULT CALLED TO, READ BACK BY AND VERIFIED WITH: D ZEIGLER PHARMD 1932 06/19/16 A BROWNING    Culture (A)  Final    STAPHYLOCOCCUS AUREUS SUSCEPTIBILITIES PERFORMED ON PREVIOUS CULTURE WITHIN THE LAST 5 DAYS. Performed at [facility], [place] [address] [place],  [address]  Report Status 06/21/2016 FINAL  Final  Blood Culture ID Panel (Reflexed)     Status: Abnormal   Collection Time: 06/18/16  6:22 PM  Result Value Ref Range Status   Enterococcus species NOT DETECTED NOT DETECTED Final   Listeria monocytogenes NOT DETECTED NOT DETECTED Final   Staphylococcus species DETECTED (A) NOT DETECTED Final    Comment: CRITICAL RESULT CALLED TO, READ BACK BY AND VERIFIED WITHMarcy Siren PHARMD 1932 06/19/16 A BROWNING    Staphylococcus aureus DETECTED (A) NOT DETECTED Final    Comment: Methicillin (oxacillin) susceptible Staphylococcus aureus (MSSA). Preferred therapy is anti staphylococcal beta lactam antibiotic (Cefazolin or Nafcillin), unless clinically contraindicated. CRITICAL RESULT CALLED TO, READ  BACK BY AND VERIFIED WITHMarcy Siren PHARMD 1932 06/19/16 A BROWNING    Methicillin resistance NOT DETECTED NOT DETECTED Final   Streptococcus species NOT DETECTED NOT DETECTED Final   Streptococcus agalactiae NOT DETECTED NOT DETECTED Final   Streptococcus pneumoniae NOT DETECTED NOT DETECTED Final   Streptococcus pyogenes NOT DETECTED NOT DETECTED Final   Acinetobacter baumannii NOT DETECTED NOT DETECTED Final   Enterobacteriaceae species NOT DETECTED NOT DETECTED Final   Enterobacter cloacae complex NOT DETECTED NOT DETECTED Final   Escherichia coli NOT DETECTED NOT DETECTED Final   Klebsiella oxytoca NOT DETECTED NOT DETECTED Final   Klebsiella pneumoniae NOT DETECTED NOT DETECTED Final   Proteus species NOT DETECTED NOT DETECTED Final   Serratia marcescens NOT DETECTED NOT DETECTED Final   Haemophilus influenzae NOT DETECTED NOT DETECTED Final   Neisseria meningitidis NOT DETECTED NOT DETECTED Final   Pseudomonas aeruginosa NOT DETECTED NOT DETECTED Final   Candida albicans NOT DETECTED NOT DETECTED Final   Candida glabrata NOT DETECTED NOT DETECTED Final   Candida krusei NOT DETECTED NOT DETECTED Final   Candida parapsilosis NOT DETECTED NOT DETECTED Final   Candida tropicalis NOT DETECTED NOT DETECTED Final    Comment: Performed at [facility], [address] [place], [place] [address]  Culture, blood (Routine X 2) w Reflex to ID Panel     Status: None (Preliminary result)   Collection Time: 06/19/16 11:08 AM  Result Value Ref Range Status   Specimen Description BLOOD RIGHT ARM  Final   Special Requests IN PEDIATRIC BOTTLE Blood Culture adequate volume  Final   Culture   Final    NO GROWTH < 24 HOURS Performed at [facility], [place] [address], [place], [place] [address]    Report Status PENDING  Incomplete  Culture, blood (Routine X 2) w Reflex to ID Panel     Status: None (Preliminary result)   Collection Time: 06/19/16 11:08 AM  Result Value Ref Range Status     Specimen Description BLOOD RIGHT HAND  Final   Special Requests IN PEDIATRIC BOTTLE Blood Culture adequate volume  Final   Culture   Final    NO GROWTH < 24 HOURS Performed at [facility], [place] [address] [place], [place] [address]    Report Status PENDING  Incomplete  Body fluid culture     Status: None (Preliminary result)   Collection Time: 06/19/16 12:25 PM  Result Value Ref Range Status   Specimen Description SYNOVIAL RIGHT KNEE  Final   Special Requests NONE  Final   Gram Stain   Final    MODERATE WBC PRESENT,BOTH PMN AND MONONUCLEAR FEW GRAM POSITIVE COCCI IN PAIRS Performed at [facility], [place] [address], [place], [place] [address]    Culture  MODERATE STAPHYLOCOCCUS AUREUS  Final   Report Status PENDING  Incomplete   Organism ID, Bacteria STAPHYLOCOCCUS AUREUS  Final      Susceptibility   Staphylococcus aureus - MIC*    CIPROFLOXACIN <=0.5 SENSITIVE Sensitive     ERYTHROMYCIN <=0.25 SENSITIVE Sensitive     GENTAMICIN <=0.5 SENSITIVE Sensitive     OXACILLIN <=0.25 SENSITIVE Sensitive     TETRACYCLINE <=1 SENSITIVE Sensitive     VANCOMYCIN <=0.5 SENSITIVE Sensitive     TRIMETH/SULFA <=10 SENSITIVE Sensitive     CLINDAMYCIN <=0.25 SENSITIVE Sensitive     RIFAMPIN <=0.5 SENSITIVE Sensitive     Inducible Clindamycin NEGATIVE Sensitive     * MODERATE STAPHYLOCOCCUS AUREUS      Radiology Studies: Dg Swallowing Func-speech Pathology  Result Date: 06/19/2016 Objective Swallowing Evaluation: Type of Study: MBS-Modified Barium Swallow Study Patient Details Name: GLENDA KUNST MRN: [id number] Date of Birth: 03-May-1924 Today's Date: 06/19/2016 Time: SLP Start Time (ACUTE ONLY): 1520-SLP Stop Time (ACUTE ONLY): 1545 SLP Time Calculation (min) (ACUTE ONLY): 25 min Past Medical History: Past Medical History: Diagnosis Date . Anxiety  . Arthritis   R knee,  . CKD (chronic kidney disease)  . Coronary artery disease  . Diabetes mellitus without complication ([facility])  .  Dysrhythmia   PAF;,sss s/p St. Jude PPM . Hyperlipidemia  . Hypertension  . MI (myocardial infarction) ([facility]) 2014 . Presence of permanent cardiac pacemaker  Past Surgical History: Past Surgical History: Procedure Laterality Date . CHOLECYSTECTOMY   . CORONARY ANGIOPLASTY    mLAD '13, RCA DES 03/2013 . EXCISIONAL TOTAL KNEE ARTHROPLASTY WITH ANTIBIOTIC SPACERS Right 04/20/2014  Procedure: IRRIGATION AND DEBRIDMENT RIGHT TOTAL KNEE REMOVE  ACL IMPLANTED AND PLACE SPACER;  Surgeon: [name], MD;  Location: [facility];  Service: Orthopedics;  Laterality: Right; . HEMORROIDECTOMY   . KNEE SURGERY  2006 . PACEMAKER INSERTION   . PICC LINE PLACE PERIPHERAL ([facility])  04/2014  R upper arm  . TOTAL KNEE ARTHROPLASTY WITH REVISION COMPONENTS Right 06/11/2014  Procedure: TOTAL KNEE ARTHROPLASTY WITH REVISION COMPONENTS REMOVE SPACER PLACE TKA;  Surgeon: [name], MD;  Location: [facility];  Service: Orthopedics;  Laterality: Right; HPI: pt admit to [facility] with AMS, cough- found to be bacterial septic.  PMH + for DM, MI, no CVA hx. CT chest and CXR negative.  Swallow eval ordered due to pt having dysphagia.  Subjective: pt awake in chair Assessment / Plan / Recommendation CHL IP CLINICAL IMPRESSIONS 06/19/2016 Clinical Impression Pt presents with severe pharyngeal dysphagia - narrow pharynx and decreased tongue base retraction results in very poor epiglottic deflection and gross pharyngeal/vallecular residuals.  Pt requires multiple swallows (up to 5-6) with each bolus and still has vallecular residuals. He did not aspirate or penetrate surprisingly.  Residuals were much worse with solids/pudding than liquids.  Various strategies/postures including head turn left, multiple swallows, following solid with liquids mitigate dysphagia.  Pt did not fully clear vallecular space but can fortunately hock and expectorate to clear.  Pt reports his swallow ability during MBS is "normal" for him causing SLP to suspect baseline significant dysphagia that  he has been managing.  Recommend strict precautions to mitigate aspiration risk and modify diet to full liquids. Using teach back, pt able to demonstrate multiple swallows, "hocking" and oral suctioning independently.  Will follow for po tolerance, readiness for dietary advancement.   SLP Visit Diagnosis Dysphagia, oropharyngeal phase (R13.12) Attention and concentration deficit following -- Frontal lobe and executive function deficit following --  Impact on safety and function Severe aspiration risk;Risk for inadequate nutrition/hydration   CHL IP TREATMENT RECOMMENDATION 06/19/2016 Treatment Recommendations Therapy as outlined in treatment plan below   Prognosis 06/19/2016 Prognosis for Safe Diet Advancement Fair Barriers to Reach Goals Time post onset Barriers/Prognosis Comment -- CHL IP DIET RECOMMENDATION 06/19/2016 SLP Diet Recommendations Nectar thick liquid;Thin liquid Liquid Administration via Cup;Straw Medication Administration (No Data) Compensations Slow rate;Small sips/bites;Follow solids with liquid;Multiple dry swallows after each bite/sip Postural Changes Seated upright at 90 degrees;Remain semi-upright after after feeds/meals (Comment)   CHL IP OTHER RECOMMENDATIONS 06/19/2016 Recommended Consults -- Oral Care Recommendations Oral care before and after PO Other Recommendations --   [facility]L IP FOLLOW UP RECOMMENDATIONS 06/19/2016 Follow up Recommendations (No Data)   [facility]L IP FREQUENCY AND DURATION 06/19/2016 Speech Therapy Frequency (ACUTE ONLY) min 2x/week Treatment Duration 1 week      [facility]L IP ORAL PHASE 06/19/2016 Oral Phase WFL Oral - Pudding Teaspoon -- Oral - Pudding Cup -- Oral - Honey Teaspoon -- Oral - Honey Cup -- Oral - Nectar Teaspoon -- Oral - Nectar Cup WFL Oral - Nectar Straw WFL Oral - Thin Teaspoon -- Oral - Thin Cup WFL Oral - Thin Straw WFL Oral - Puree WFL Oral - Mech Soft WFL Oral - Regular WFL Oral - Multi-Consistency -- Oral - Pill -- Oral Phase - Comment --  CHL IP PHARYNGEAL PHASE 06/19/2016  Pharyngeal Phase Impaired Pharyngeal- Pudding Teaspoon -- Pharyngeal -- Pharyngeal- Pudding Cup -- Pharyngeal -- Pharyngeal- Honey Teaspoon -- Pharyngeal -- Pharyngeal- Honey Cup -- Pharyngeal -- Pharyngeal- Nectar Teaspoon -- Pharyngeal -- Pharyngeal- Nectar Cup Reduced pharyngeal peristalsis;Pharyngeal residue - valleculae;Reduced tongue base retraction Pharyngeal -- Pharyngeal- Nectar Straw Reduced pharyngeal peristalsis;Reduced tongue base retraction;Pharyngeal residue - valleculae Pharyngeal -- Pharyngeal- Thin Teaspoon -- Pharyngeal -- Pharyngeal- Thin Cup Reduced epiglottic inversion;Reduced tongue base retraction;Reduced pharyngeal peristalsis Pharyngeal -- Pharyngeal- Thin Straw Reduced pharyngeal peristalsis;Reduced epiglottic inversion;Pharyngeal residue - valleculae Pharyngeal -- Pharyngeal- Puree Reduced epiglottic inversion;Reduced pharyngeal peristalsis;Pharyngeal residue - valleculae Pharyngeal -- Pharyngeal- Mechanical Soft -- Pharyngeal -- Pharyngeal- Regular Reduced pharyngeal peristalsis;Reduced epiglottic inversion;Reduced tongue base retraction;Pharyngeal residue - valleculae Pharyngeal -- Pharyngeal- Multi-consistency -- Pharyngeal -- Pharyngeal- Pill -- Pharyngeal -- Pharyngeal Comment pt without awareness to mild leftover vallecular residuals, he is able to clear/expectorate with cues   [facility]L IP CERVICAL ESOPHAGEAL PHASE 06/19/2016 Cervical Esophageal Phase WFL Pudding Teaspoon -- Pudding Cup -- Honey Teaspoon -- Honey Cup -- Nectar Teaspoon -- Nectar Cup -- Nectar Straw -- Thin Teaspoon -- Thin Cup -- Thin Straw -- Puree -- Mechanical Soft -- Regular -- Multi-consistency -- Pill -- Cervical Esophageal Comment -- No flowsheet data found. Luanna Salk, MS [facility] SLP [phone]                Scheduled Meds: . chlorhexidine  15 mL Mouth Rinse BID  . Chlorhexidine Gluconate Cloth  6 each Topical Q0600  . enoxaparin (LOVENOX) injection  30 mg Subcutaneous Q24H  . fluconazole  100 mg Oral Daily    . insulin aspart  0-20 Units Subcutaneous TID WC  . insulin aspart  0-5 Units Subcutaneous QHS  . insulin detemir  5 Units Subcutaneous BID  . liver oil-zinc oxide   Topical BID  . mouth rinse  15 mL Mouth Rinse q12n4p  . mupirocin ointment  1 application Nasal BID  . nystatin   Topical TID  . potassium chloride  40 mEq Oral Daily   Continuous Infusions: . sodium chloride 10 mL/hr at 06/21/16 0829  .  nafcillin IV Stopped (06/21/16 0850)  . potassium chloride 10 mEq (06/21/16 0928)  . vancomycin Stopped (06/20/16 2313)     LOS: 4 days   Time Spent in minutes   30 minutes  Jahmire Ruffins D.O. on 06/21/2016 at 10:27 AM  Between 7am to 7pm - Pager - [phone]  After 7pm go to [email or web address] - password [email or web address]  And look for the night coverage person covering for me after hours  [facility] Office  [phone]

## 2016-06-21 NOTE — Progress Notes (Signed)
  Speech Language Pathology Treatment: Dysphagia  Patient Details Name: Randy [name] MRN: [id number] DOB: 10/11/1924 Today's Date: 06/21/2016 Time: 4098-1191 SLP Time Calculation (min) (ACUTE ONLY): 19 min  Assessment / Plan / Recommendation Clinical Impression  ST follow up at request of MD for possible diet advancement.  Chart review indicated that the patient has been afebrile, intake has been fair and lungs are clear but diminished.  Patient with recent MBS with significant pharyngeal residue noted that the patient is not sensing.  The patient did not have recall of safe swallow precautions that were discussed after MBS on Friday and left of signage in the room.   The patient was trialed with thin liquids, pureed material and dual textured solids.  Cueing was required to get the patient to use the safe swallow precautions ( I.e multiple swallows each bite/sip and cough and expectorate every bite/swallow).  Given dual textured solids the patient was noted to expectorate material that looked like the dual textured material he had just taken in.  Recommend continue with full liquids using all safe swallow precautions recommended during MBS.  Patient will require supervision due to difficulty independently recalling all precautions.  ST will continue to follow for possible diet advancement.     HPI HPI: pt admit to [facility] with AMS, cough- found to be bacterial septic.  PMH + for DM, MI, no CVA hx. CT chest and CXR negative.  Swallow eval ordered due to pt having dysphagia.       SLP Plan  Continue with current plan of care       Recommendations  Diet recommendations: Other(comment) (Full liquids.  ) Liquids provided via: Cup Medication Administration: Crushed with puree Supervision: Staff to assist with self feeding;Full supervision/cueing for compensatory strategies Compensations: Slow rate;Small sips/bites;Follow solids with liquid;Multiple dry swallows after each bite/sip (Cough and  reswallow after multiple swallows.) Postural Changes and/or Swallow Maneuvers: Seated upright 90 degrees;Upright 30-60 min after meal                Oral Care Recommendations: Oral care BID Follow up Recommendations: Other (comment) (TBD) SLP Visit Diagnosis: Dysphagia, oropharyngeal phase (R13.12) Plan: Continue with current plan of care       [place], MA, CCC-SLP Acute Rehab SLP [phone] Lamar Sprinkles 06/21/2016, 3:19 PM

## 2016-06-22 ENCOUNTER — Encounter (HOSPITAL_COMMUNITY)
Admission: EM | Disposition: A | Discharge status: Skilled Nursing Facility | Payer: Self-pay | Source: Home / Self Care | Attending: Internal Medicine

## 2016-06-22 ENCOUNTER — Encounter (HOSPITAL_COMMUNITY): Payer: Self-pay

## 2016-06-22 ENCOUNTER — Inpatient Hospital Stay (HOSPITAL_COMMUNITY): Payer: Medicare Other

## 2016-06-22 DIAGNOSIS — T827XXD Infection and inflammatory reaction due to other cardiac and vascular devices, implants and grafts, subsequent encounter: Secondary | ICD-10-CM

## 2016-06-22 LAB — GLUCOSE, CAPILLARY
GLUCOSE-CAPILLARY: 123 mg/dL — AB (ref 65–99)
GLUCOSE-CAPILLARY: 261 mg/dL — AB (ref 65–99)
GLUCOSE-CAPILLARY: 120 mg/dL — AB (ref 65–99)
Glucose-Capillary: 93 mg/dL (ref 65–99)

## 2016-06-22 LAB — BODY FLUID CULTURE

## 2016-06-22 LAB — BASIC METABOLIC PANEL
ANION GAP: 8 (ref 5–15)
BUN: 18 mg/dL (ref 6–20)
CALCIUM: 7.2 mg/dL — AB (ref 8.9–10.3)
CO2: 20 mmol/L — ABNORMAL LOW (ref 22–32)
Chloride: 111 mmol/L (ref 101–111)
Creatinine, Ser: 1.83 mg/dL — ABNORMAL HIGH (ref 0.61–1.24)
GFR calc Af Amer: 36 mL/min — ABNORMAL LOW (ref >60)
GFR, EST NON AFRICAN AMERICAN: 31 mL/min — AB (ref >60)
GLUCOSE: 123 mg/dL — AB (ref 65–99)
Potassium: 3.2 mmol/L — ABNORMAL LOW (ref 3.5–5.1)
Sodium: 139 mmol/L (ref 135–145)

## 2016-06-22 LAB — CBC
HEMATOCRIT: 34.8 % — AB (ref 39.0–52.0)
Hemoglobin: 11.3 g/dL — ABNORMAL LOW (ref 13.0–17.0)
MCH: 27.6 pg (ref 26.0–34.0)
MCHC: 32.5 g/dL (ref 30.0–36.0)
MCV: 85.1 fL (ref 78.0–100.0)
PLATELETS: 305 10*3/uL (ref 150–400)
RBC: 4.09 MIL/uL — AB (ref 4.22–5.81)
RDW: 16.3 % — AB (ref 11.5–15.5)
WBC: 10.6 10*3/uL — AB (ref 4.0–10.5)

## 2016-06-22 LAB — MAGNESIUM: Magnesium: 1.6 mg/dL — ABNORMAL LOW (ref 1.7–2.4)

## 2016-06-22 LAB — PHOSPHORUS: PHOSPHORUS: 2 mg/dL — AB (ref 2.5–4.6)

## 2016-06-22 SURGERY — INVASIVE LAB ABORTED CASE

## 2016-06-22 MED ORDER — MIDAZOLAM HCL 10 MG/2ML IJ SOLN
INTRAMUSCULAR | Status: DC | PRN
  Administered 2016-06-22 (×4): 1 mg via INTRAVENOUS

## 2016-06-22 MED ORDER — POTASSIUM CHLORIDE 10 MEQ/100ML IV SOLN
10.0000 meq | INTRAVENOUS | Status: AC
  Administered 2016-06-22 (×2): 10 meq via INTRAVENOUS
  Filled 2016-06-22 (×2): qty 100

## 2016-06-22 MED ORDER — MAGNESIUM SULFATE 2 GM/50ML IV SOLN
2.0000 g | Freq: Once | INTRAVENOUS | Status: AC
  Administered 2016-06-22: 2 g via INTRAVENOUS
  Filled 2016-06-22: qty 50

## 2016-06-22 MED ORDER — FENTANYL CITRATE (PF) 100 MCG/2ML IJ SOLN
INTRAMUSCULAR | Status: DC | PRN
  Administered 2016-06-22 (×2): 12.5 ug via INTRAVENOUS

## 2016-06-22 MED ORDER — BOOST / RESOURCE BREEZE PO LIQD
1.0000 | Freq: Two times a day (BID) | ORAL | Status: DC
  Administered 2016-06-23: 1 via ORAL
  Administered 2016-06-23: 237 mL via ORAL
  Administered 2016-06-25 – 2016-07-06 (×21): 1 via ORAL

## 2016-06-22 MED ORDER — MIDAZOLAM HCL 5 MG/ML IJ SOLN
INTRAMUSCULAR | Status: AC
  Filled 2016-06-22: qty 2

## 2016-06-22 MED ORDER — SODIUM CHLORIDE 0.9 % IV SOLN
3.0000 g | Freq: Four times a day (QID) | INTRAVENOUS | Status: DC
  Administered 2016-06-22 – 2016-06-29 (×28): 3 g via INTRAVENOUS
  Filled 2016-06-22 (×29): qty 3

## 2016-06-22 MED ORDER — FENTANYL CITRATE (PF) 100 MCG/2ML IJ SOLN
INTRAMUSCULAR | Status: AC
  Filled 2016-06-22: qty 2

## 2016-06-22 NOTE — CV Procedure (Signed)
    Transesophageal Echocardiogram Note  LINDWOOD MOGEL [id number] May 26, 1924  Procedure: Transesophageal Echocardiogram Indications: Bacteremia  Procedure Details Consent: Obtained Time Out: Verified patient identification, verified procedure, site/side was marked, verified correct patient position, special equipment/implants available, Radiology Safety Procedures followed,  medications/allergies/relevent history reviewed, required imaging and test results available.  Performed  Medications:  During this procedure the patient is administered a total of Versed 4 mg and Fentanyl 25 mcg  to achieve and maintain moderate conscious sedation.  The patient's heart rate, blood pressure, and oxygen saturation are monitored continuously during the procedure. The period of conscious sedation is 30 minutes, of which I was present face-to-face 100% of this time.   Multiple attempts at passing TEE probe by both myself and Dr Meda Coffee unsuccessful; procedure canceled. Options include repeat attempt with general anesthesia vs treating with long term antibiotics; will review with EP.  Complications: No apparent complications Patient did tolerate procedure well.  Kirk Ruths, MD

## 2016-06-22 NOTE — Progress Notes (Signed)
    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  After careful review of history and examination, the risks and benefits of transesophageal echocardiogram have been explained including risks of esophageal damage, perforation (1:10,000 risk), bleeding, pharyngeal hematoma as well as other potential complications associated with conscious sedation including aspiration, arrhythmia, respiratory failure and death. Alternatives to treatment were discussed, questions were answered. Patient is willing to proceed.   Kerin Ransom, [place]  06/22/2016 9:25 AM

## 2016-06-22 NOTE — Progress Notes (Signed)
PROGRESS NOTE    [name]  UXL:[id number] DOB: 1924-10-08 DOA: 06/17/2016 PCP: System, Pcp Not In   Chief Complaint  Patient presents with  . Altered Mental Status  . Hyperglycemia  . Fever    Brief Narrative:  HPI on 06/17/2016 by Dr. Lily Kocher Randy [name] is a 83 y.o. gentleman with a history of mild dementia/cognitive impairment, CAD with prior stent placement, history of atrial fibrillation S/P PPM implant (CHADS-Vasc score of at least four, no anticoagulation), HTN, HLD, DM, and CKD 3 with baseline creatinine around 1.3-1.5 who was referred to the ED via EMS by one of his caregivers for evaluation of increased lethargy, mental status changes, and cough.  The patient is nonambulatory at baseline and spends most of his day sitting up a recliner.  He has caregiver support 24 hours/day.  He has had cough for 4-5 days.  He has had two known sick contacts (son-in-law and caregiver).  He has had yellow sputum.  No chest pain or shortness of breath.  No nausea or vomiting.  No abdominal pain.  No dysuria.  No diarrhea. Today, he was noted to have increased lethargy and muffled speech with slurring.  No other focal weakenss.  911 was called and the patient was transferred to the ED for evaluation. No recent hospitalizations.  No recent antibiotics.  He has been using OTC mucinex for his symptoms. No overt signs or symptoms of aspiration.  Interim history 1/2 blood cultures +enterococcus and MSSA. ID consulted. TEE planned for today 06/22/2016. Currently on vancomycin. Patient also has infected knee, ortho consulted.  Assessment & Plan   Sepsis secondary to MSSA/Enterococcus bacteremia/Right prosthetic knee infection -Suspected respiratory source vs groin cellulitis vs bacteremia -Presented with fever, tachycardia, tachypnea, leukocytosis -Patient also had elevated pro calcitonin level as well as lactic acid -Initially placed on  vancomycin and zosyn, zosyn discontinued on  06/18/16 -Continue vancomycin -Chest x-ray, UA unremarkable for infection   -CT chest showed subpleural fibrosis, no acute pulmonary infiltrates. -Influenza PCR, strep pneumonia urine antigen negative -Blood cultures 06/17/2016: MSSA, enterococcus  -Infectious disease consulted and appreciated, recommended TEE and EP consult -Cardiology consulted and appreciated, plan for TEE on 06/22/2016 -Echocardiogram showed EF 55-60% -Repeat blood cultures 06/19/2016 show no growth to date -orthopedic consulted and appreciated (Dr. Mayer Camel, as patient does have RTKA (2016) and has had infection before) -s/p aspiration of the right knee joint showed moderate Staph Aureus (pansensitive) -Plan would be for possible AKA.  Essential hypertension -Currently on no home medications -Continue to monitor -Continue IV hydralazine PRN  Diabetes mellitus, type II -Currently on no home medications -Continue insulin sliding scale, Levemir, CBG monitoring  History of atrial fibrillation -CHADSVASC 4 -Currently on no anticoagulation -Status post pacemaker placement- cardiology consulted as patient does have bacteremia  Acute on Chronic kidney disease, stage III -Creatinine baseline at 1.5 -Currently creatinine 1.83 -Continue to monitor BMP  Candidiasis  -Groin area appears to be erythematous and flaky -Continue diflucan  Stage II pressure injury -moisture related -wound care consulted and appreciated  Moderate aortic stenosis -Noted on echocardiogram, TEE planned for today 4 06/22/2016  Possible Dementia -Per son patient can sometimes be clear minded but is not always  Nutrition -Speech consulted and recommended nectar thick liquid -Speech asked to reevaluate and see if patient's diet can be advanced.  Right lower extremity edema -Likely secondary to infected TKR -LE doppler ordered  -Discontinued IVF as edema does appear to be worsening   Hypokalemia -continue to  replace and continue to monitor  BMP  Hypomagnesemia -Will replace and monitor  Goals of care -Had discuss with son via phone regarding patient, current treatment and plan, code status, mental status. Per son, patient's mental status is not always clear and at this time he would like to remain full code and possible have surgeries that might be needed. He has given consent for TEE.    DVT Prophylaxis  lovenox  Code Status: Full  Family Communication: None at bedside.   Disposition Plan: Admitted. Continue to monitor in stepdown. Pending TEE today 06/22/2016  Consultants Infectious disease Cardiology Orthopedics  Procedures  Echocardiogram Right knee aspiration   Antibiotics   Anti-infectives    Start     Dose/Rate Route Frequency Ordered Stop   06/19/16 2100  nafcillin 2 g in dextrose 5 % 100 mL IVPB     2 g 200 mL/hr over 30 Minutes Intravenous Every 4 hours 06/18/16 2032     06/18/16 2200  vancomycin (VANCOCIN) IVPB 1000 mg/200 mL premix     1,000 mg 200 mL/hr over 60 Minutes Intravenous Every 24 hours 06/18/16 1702     06/18/16 2100  vancomycin (VANCOCIN) IVPB 750 mg/150 ml premix  Status:  Discontinued     750 mg 150 mL/hr over 60 Minutes Intravenous Every 24 hours 06/17/16 2103 06/17/16 2103   06/18/16 2100  vancomycin (VANCOCIN) IVPB 1000 mg/200 mL premix  Status:  Discontinued     1,000 mg 200 mL/hr over 60 Minutes Intravenous Every 24 hours 06/17/16 2103 06/18/16 1638   06/18/16 2100  nafcillin IVPB 2 g  Status:  Discontinued     2 g 200 mL/hr over 30 Minutes Intravenous Every 4 hours 06/18/16 2025 06/18/16 2026   06/18/16 2100  nafcillin 2 g in dextrose 5 % 50 mL IVPB     2 g 100 mL/hr over 30 Minutes Intravenous Every 4 hours 06/18/16 2027 06/19/16 1741   06/18/16 1700  nafcillin injection 2 g  Status:  Discontinued     2 g Intravenous Every 4 hours 06/18/16 1641 06/18/16 1650   06/18/16 1700  nafcillin 2 g in dextrose 5 % 100 mL IVPB  Status:  Discontinued     2 g 200 mL/hr over 30 Minutes  Intravenous Every 4 hours 06/18/16 1650 06/18/16 2032   06/18/16 1600  fluconazole (DIFLUCAN) tablet 100 mg     100 mg Oral Daily 06/18/16 1516     06/18/16 0400  piperacillin-tazobactam (ZOSYN) IVPB 3.375 g  Status:  Discontinued     3.375 g 12.5 mL/hr over 240 Minutes Intravenous Every 8 hours 06/17/16 2103 06/18/16 1556   06/17/16 2045  piperacillin-tazobactam (ZOSYN) IVPB 3.375 g     3.375 g 100 mL/hr over 30 Minutes Intravenous  Once 06/17/16 2032 06/17/16 2152   06/17/16 2045  vancomycin (VANCOCIN) IVPB 1000 mg/200 mL premix     1,000 mg 200 mL/hr over 60 Minutes Intravenous  Once 06/17/16 2032 06/17/16 2334      Subjective:   Joshus Rogan seen and examined today.  Patient has no complaints today, would like to eat and drink something. Understands that he will have a procedure this morning. Denies current chest pain, shortness of breath, abdominal pain, nausea, vomiting, diarrhea, constipation.   Objective:   Vitals:   06/21/16 2328 06/22/16 0000 06/22/16 0300 06/22/16 0400  BP:  (!) 118/44  (!) 128/111  Pulse:  65  79  Resp:  (!) 26  (!) 27  Temp: 97.9  F (36.6 C)  97.5 F (36.4 C)   TempSrc: Oral  Oral   SpO2:  97%  97%  Weight:      Height:        Intake/Output Summary (Last 24 hours) at 06/22/16 0821 Last data filed at 06/22/16 0459  Gross per 24 hour  Intake           2480.5 ml  Output             1055 ml  Net           1425.5 ml   Filed Weights   06/17/16 1931 06/18/16 0056 06/20/16 0527  Weight: 81.6 kg (180 lb) 90.1 kg (198 lb 10.2 oz) 93.7 kg (206 lb 9.1 oz)    Exam   General: Well developed, well nourished, no apparent distress  HEENT: NCAT, mucous membranes moist, poor dentition.   Cardiovascular: S1 S2 auscultated, RRR, 2/6 SEM  Respiratory: Diminished breath sounds, no wheezing noted  Abdomen: Soft, nontender, nondistended, + bowel sounds  Extremities: RLE edema greater than LLE. Right knee TTP  Neuro: AAOx3, nonfocal  Skin: Groin  erythema, flaky skin (likely fungal infection)  Psych: Appropriate mood and affect, very pleasant    Data Reviewed: I have personally reviewed following labs and imaging studies  CBC:  Recent Labs Lab 06/17/16 2012 06/18/16 0812 06/19/16 0334 06/20/16 0351 06/21/16 0334 06/22/16 0352  WBC 20.1* 18.7* 20.6* 14.5* 11.5* 10.6*  NEUTROABS 15.7*  --   --   --   --   --   HGB 13.9 12.7* 13.2 11.9* 12.0* 11.3*  HCT 42.6 38.7* 39.8 37.1* 36.9* 34.8*  MCV 86.8 85.6 86.0 86.3 85.2 85.1  PLT 273 249 268 279 291 161   Basic Metabolic Panel:  Recent Labs Lab 06/18/16 0812 06/19/16 0334 06/20/16 0351 06/21/16 0334 06/22/16 0352  NA 140 141 143 141 139  K 4.0 3.6 3.1* 3.0* 3.2*  CL 109 110 114* 114* 111  CO2 21* 22 22 19* 20*  GLUCOSE 302* 209* 116* 164* 123*  BUN 20 20 23* 21* 18  CREATININE 1.61* 1.71* 2.03* 1.79* 1.83*  CALCIUM 8.0* 8.1* 7.6* 7.2* 7.2*  MG  --   --   --   --  1.6*  PHOS  --   --   --   --  2.0*   GFR: Estimated Creatinine Clearance: 31.4 mL/min (A) (by C-G formula based on SCr of 1.83 mg/dL (H)). Liver Function Tests:  Recent Labs Lab 06/17/16 2012  AST 21  ALT 8*  ALKPHOS 70  BILITOT 0.6  PROT 7.1  ALBUMIN 3.3*   No results for input(s): LIPASE, AMYLASE in the last 168 hours. No results for input(s): AMMONIA in the last 168 hours. Coagulation Profile:  Recent Labs Lab 06/18/16 0042  INR 1.29   Cardiac Enzymes: No results for input(s): CKTOTAL, CKMB, CKMBINDEX, TROPONINI in the last 168 hours. BNP (last 3 results) No results for input(s): PROBNP in the last 8760 hours. HbA1C: No results for input(s): HGBA1C in the last 72 hours. CBG:  Recent Labs Lab 06/21/16 0801 06/21/16 1227 06/21/16 1611 06/21/16 2127 06/22/16 0800  GLUCAP 134* 159* 216* 161* 123*   Lipid Profile: No results for input(s): CHOL, HDL, LDLCALC, TRIG, CHOLHDL, LDLDIRECT in the last 72 hours. Thyroid Function Tests: No results for input(s): TSH, T4TOTAL,  FREET4, T3FREE, THYROIDAB in the last 72 hours. Anemia Panel: No results for input(s): VITAMINB12, FOLATE, FERRITIN, TIBC, IRON, RETICCTPCT in the last 72  hours. Urine analysis:    Component Value Date/Time   COLORURINE YELLOW 06/17/2016 1959   APPEARANCEUR CLEAR 06/17/2016 1959   LABSPEC 1.020 06/17/2016 1959   PHURINE 5.0 06/17/2016 1959   GLUCOSEU >=500 (A) 06/17/2016 1959   HGBUR SMALL (A) 06/17/2016 1959   BILIRUBINUR NEGATIVE 06/17/2016 1959   KETONESUR 5 (A) 06/17/2016 1959   PROTEINUR 30 (A) 06/17/2016 1959   UROBILINOGEN 0.2 05/18/2014 1634   NITRITE NEGATIVE 06/17/2016 1959   LEUKOCYTESUR NEGATIVE 06/17/2016 1959   Sepsis Labs: @LABRCNTIP (procalcitonin:4,lacticidven:4)  ) Recent Results (from the past 240 hour(s))  Blood Culture (routine x 2)     Status: Abnormal   Collection Time: 06/17/16  7:52 PM  Result Value Ref Range Status   Specimen Description BLOOD BLOOD LEFT FOREARM  Final   Special Requests IN PEDIATRIC BOTTLE Blood Culture adequate volume  Final   Culture  Setup Time   Final    GRAM POSITIVE COCCI IN CLUSTERS IN PEDIATRIC BOTTLE CRITICAL VALUE NOTED.  VALUE IS CONSISTENT WITH PREVIOUSLY REPORTED AND CALLED VALUE.    Culture (A)  Final    STAPHYLOCOCCUS SPECIES (COAGULASE NEGATIVE) THE SIGNIFICANCE OF ISOLATING THIS ORGANISM FROM A SINGLE SET OF BLOOD CULTURES WHEN MULTIPLE SETS ARE DRAWN IS UNCERTAIN. PLEASE NOTIFY THE MICROBIOLOGY DEPARTMENT WITHIN ONE WEEK IF SPECIATION AND SENSITIVITIES ARE REQUIRED. Performed at [facility], [place] [address] [place], [place] [address]    Report Status 06/19/2016 FINAL  Final  Blood Culture (routine x 2)     Status: Abnormal   Collection Time: 06/17/16  8:12 PM  Result Value Ref Range Status   Specimen Description BLOOD RIGHT ANTECUBITAL  Final   Special Requests   Final    BOTTLES DRAWN AEROBIC AND ANAEROBIC Blood Culture results may not be optimal due to an inadequate volume of blood received in culture  bottles   Culture  Setup Time   Final    GRAM POSITIVE COCCI IN CHAINS IN BOTH AEROBIC AND ANAEROBIC BOTTLES CRITICAL RESULT CALLED TO, READ BACK BY AND VERIFIED WITH: N. Glogovac Pharm.D. 15:45 06/18/16 ([email or web address]) Performed at [facility], [place] [address] [place], [place] [address]    Culture STAPHYLOCOCCUS AUREUS ENTEROCOCCUS FAECALIS  (A)  Final   Report Status 06/21/2016 FINAL  Final   Organism ID, Bacteria STAPHYLOCOCCUS AUREUS  Final   Organism ID, Bacteria ENTEROCOCCUS FAECALIS  Final      Susceptibility   Enterococcus faecalis - MIC*    AMPICILLIN <=2 SENSITIVE Sensitive     VANCOMYCIN <=0.5 SENSITIVE Sensitive     GENTAMICIN SYNERGY SENSITIVE Sensitive     * ENTEROCOCCUS FAECALIS   Staphylococcus aureus - MIC*    CIPROFLOXACIN <=0.5 SENSITIVE Sensitive     ERYTHROMYCIN <=0.25 SENSITIVE Sensitive     GENTAMICIN <=0.5 SENSITIVE Sensitive     OXACILLIN <=0.25 SENSITIVE Sensitive     TETRACYCLINE <=1 SENSITIVE Sensitive     VANCOMYCIN <=0.5 SENSITIVE Sensitive     TRIMETH/SULFA <=10 SENSITIVE Sensitive     CLINDAMYCIN <=0.25 SENSITIVE Sensitive     RIFAMPIN <=0.5 SENSITIVE Sensitive     Inducible Clindamycin NEGATIVE Sensitive     * STAPHYLOCOCCUS AUREUS  Blood Culture ID Panel (Reflexed)     Status: Abnormal   Collection Time: 06/17/16  8:12 PM  Result Value Ref Range Status   Enterococcus species DETECTED (A) NOT DETECTED Final    Comment: CRITICAL RESULT CALLED TO, READ BACK BY AND VERIFIED WITH: N. Glogovac Pharm.D. 15:45 06/18/16 ([email or web address])  Vancomycin resistance NOT DETECTED NOT DETECTED Final   Listeria monocytogenes NOT DETECTED NOT DETECTED Final   Staphylococcus species DETECTED (A) NOT DETECTED Final    Comment: CRITICAL RESULT CALLED TO, READ BACK BY AND VERIFIED WITH: N. Glogovac Pharm.D. 15:45 06/18/16 (wilsonm)    Staphylococcus aureus DETECTED (A) NOT DETECTED Final    Comment: Methicillin (oxacillin) susceptible Staphylococcus aureus (MSSA).  Preferred therapy is anti staphylococcal beta lactam antibiotic (Cefazolin or Nafcillin), unless clinically contraindicated. CRITICAL RESULT CALLED TO, READ BACK BY AND VERIFIED WITH: N. Glogovac Pharm.D. 15:45 06/18/16 (wilsonm)    Methicillin resistance NOT DETECTED NOT DETECTED Final   Streptococcus species NOT DETECTED NOT DETECTED Final   Streptococcus agalactiae NOT DETECTED NOT DETECTED Final   Streptococcus pneumoniae NOT DETECTED NOT DETECTED Final   Streptococcus pyogenes NOT DETECTED NOT DETECTED Final   Acinetobacter baumannii NOT DETECTED NOT DETECTED Final   Enterobacteriaceae species NOT DETECTED NOT DETECTED Final   Enterobacter cloacae complex NOT DETECTED NOT DETECTED Final   Escherichia coli NOT DETECTED NOT DETECTED Final   Klebsiella oxytoca NOT DETECTED NOT DETECTED Final   Klebsiella pneumoniae NOT DETECTED NOT DETECTED Final   Proteus species NOT DETECTED NOT DETECTED Final   Serratia marcescens NOT DETECTED NOT DETECTED Final   Haemophilus influenzae NOT DETECTED NOT DETECTED Final   Neisseria meningitidis NOT DETECTED NOT DETECTED Final   Pseudomonas aeruginosa NOT DETECTED NOT DETECTED Final   Candida albicans NOT DETECTED NOT DETECTED Final   Candida glabrata NOT DETECTED NOT DETECTED Final   Candida krusei NOT DETECTED NOT DETECTED Final   Candida parapsilosis NOT DETECTED NOT DETECTED Final   Candida tropicalis NOT DETECTED NOT DETECTED Final    Comment: Performed at [facility], [address], [place], [place] [address]  MRSA PCR Screening     Status: Abnormal   Collection Time: 06/18/16  1:40 AM  Result Value Ref Range Status   MRSA by PCR POSITIVE (A) NEGATIVE Final    Comment:        The GeneXpert MRSA Assay (FDA approved for NASAL specimens only), is one component of a comprehensive MRSA colonization surveillance program. It is not intended to diagnose MRSA infection nor to guide or monitor treatment for MRSA infections. RESULT CALLED  TO, READ BACK BY AND VERIFIED WITH: C.SMITH,RN [id number] 06/18/16 W.SHEA   Culture, respiratory (NON-Expectorated)     Status: None   Collection Time: 06/18/16  6:32 AM  Result Value Ref Range Status   Specimen Description TRACHEAL ASPIRATE  Final   Special Requests NONE  Final   Gram Stain   Final    ABUNDANT WBC PRESENT, PREDOMINANTLY PMN RARE SQUAMOUS EPITHELIAL CELLS PRESENT MODERATE GRAM POSITIVE RODS GRAM POSITIVE COCCI IN PAIRS    Culture   Final    Consistent with normal respiratory flora. Performed at [facility], [place] [address] [place], [place] [address]    Report Status 06/20/2016 FINAL  Final  Culture, blood (Routine X 2) w Reflex to ID Panel     Status: None (Preliminary result)   Collection Time: 06/18/16  6:16 PM  Result Value Ref Range Status   Specimen Description BLOOD RIGHT ARM  Final   Special Requests IN PEDIATRIC BOTTLE Blood Culture adequate volume  Final   Culture   Final    NO GROWTH 3 DAYS Performed at [facility], [place] [address] [place], [place] [address]    Report Status PENDING  Incomplete  Culture, blood (Routine X 2)  w Reflex to ID Panel     Status: Abnormal   Collection Time: 06/18/16  6:22 PM  Result Value Ref Range Status   Specimen Description BLOOD RIGHT ANTECUBITAL  Final   Special Requests IN PEDIATRIC BOTTLE Blood Culture adequate volume  Final   Culture  Setup Time   Final    GRAM POSITIVE COCCI IN CLUSTERS IN PEDIATRIC BOTTLE CRITICAL RESULT CALLED TO, READ BACK BY AND VERIFIED WITH: D ZEIGLER PHARMD 1932 06/19/16 A BROWNING    Culture (A)  Final    STAPHYLOCOCCUS AUREUS SUSCEPTIBILITIES PERFORMED ON PREVIOUS CULTURE WITHIN THE LAST 5 DAYS. Performed at [facility], [place] [address], [place], [place] [address]    Report Status 06/21/2016 FINAL  Final  Blood Culture ID Panel (Reflexed)     Status: Abnormal   Collection Time: 06/18/16  6:22 PM  Result Value Ref Range Status   Enterococcus species NOT DETECTED NOT  DETECTED Final   Listeria monocytogenes NOT DETECTED NOT DETECTED Final   Staphylococcus species DETECTED (A) NOT DETECTED Final    Comment: CRITICAL RESULT CALLED TO, READ BACK BY AND VERIFIED WITHMarcy Siren PHARMD 1932 06/19/16 A BROWNING    Staphylococcus aureus DETECTED (A) NOT DETECTED Final    Comment: Methicillin (oxacillin) susceptible Staphylococcus aureus (MSSA). Preferred therapy is anti staphylococcal beta lactam antibiotic (Cefazolin or Nafcillin), unless clinically contraindicated. CRITICAL RESULT CALLED TO, READ BACK BY AND VERIFIED WITHMarcy Siren PHARMD 1932 06/19/16 A BROWNING    Methicillin resistance NOT DETECTED NOT DETECTED Final   Streptococcus species NOT DETECTED NOT DETECTED Final   Streptococcus agalactiae NOT DETECTED NOT DETECTED Final   Streptococcus pneumoniae NOT DETECTED NOT DETECTED Final   Streptococcus pyogenes NOT DETECTED NOT DETECTED Final   Acinetobacter baumannii NOT DETECTED NOT DETECTED Final   Enterobacteriaceae species NOT DETECTED NOT DETECTED Final   Enterobacter cloacae complex NOT DETECTED NOT DETECTED Final   Escherichia coli NOT DETECTED NOT DETECTED Final   Klebsiella oxytoca NOT DETECTED NOT DETECTED Final   Klebsiella pneumoniae NOT DETECTED NOT DETECTED Final   Proteus species NOT DETECTED NOT DETECTED Final   Serratia marcescens NOT DETECTED NOT DETECTED Final   Haemophilus influenzae NOT DETECTED NOT DETECTED Final   Neisseria meningitidis NOT DETECTED NOT DETECTED Final   Pseudomonas aeruginosa NOT DETECTED NOT DETECTED Final   Candida albicans NOT DETECTED NOT DETECTED Final   Candida glabrata NOT DETECTED NOT DETECTED Final   Candida krusei NOT DETECTED NOT DETECTED Final   Candida parapsilosis NOT DETECTED NOT DETECTED Final   Candida tropicalis NOT DETECTED NOT DETECTED Final    Comment: Performed at [facility], [address], [place], [place] [address]  Culture, blood (Routine X 2) w Reflex to ID Panel     Status:  None (Preliminary result)   Collection Time: 06/19/16 11:08 AM  Result Value Ref Range Status   Specimen Description BLOOD RIGHT ARM  Final   Special Requests IN PEDIATRIC BOTTLE Blood Culture adequate volume  Final   Culture   Final    NO GROWTH 2 DAYS Performed at [facility], [place] [address], [place], [place] [address]    Report Status PENDING  Incomplete  Culture, blood (Routine X 2) w Reflex to ID Panel     Status: None (Preliminary result)   Collection Time: 06/19/16 11:08 AM  Result Value Ref Range Status   Specimen Description BLOOD RIGHT HAND  Final   Special Requests IN PEDIATRIC BOTTLE Blood  Culture adequate volume  Final   Culture   Final    NO GROWTH 2 DAYS Performed at [facility], [place] [address] [place], [place] [address]    Report Status PENDING  Incomplete  Body fluid culture     Status: None (Preliminary result)   Collection Time: 06/19/16 12:25 PM  Result Value Ref Range Status   Specimen Description SYNOVIAL RIGHT KNEE  Final   Special Requests NONE  Final   Gram Stain   Final    MODERATE WBC PRESENT,BOTH PMN AND MONONUCLEAR FEW GRAM POSITIVE COCCI IN PAIRS Performed at [facility], [place] [address], [place], [place] [address]    Culture MODERATE STAPHYLOCOCCUS AUREUS  Final   Report Status PENDING  Incomplete   Organism ID, Bacteria STAPHYLOCOCCUS AUREUS  Final      Susceptibility   Staphylococcus aureus - MIC*    CIPROFLOXACIN <=0.5 SENSITIVE Sensitive     ERYTHROMYCIN <=0.25 SENSITIVE Sensitive     GENTAMICIN <=0.5 SENSITIVE Sensitive     OXACILLIN <=0.25 SENSITIVE Sensitive     TETRACYCLINE <=1 SENSITIVE Sensitive     VANCOMYCIN <=0.5 SENSITIVE Sensitive     TRIMETH/SULFA <=10 SENSITIVE Sensitive     CLINDAMYCIN <=0.25 SENSITIVE Sensitive     RIFAMPIN <=0.5 SENSITIVE Sensitive     Inducible Clindamycin NEGATIVE Sensitive     * MODERATE STAPHYLOCOCCUS AUREUS      Radiology Studies: No results found.   Scheduled Meds: .  chlorhexidine  15 mL Mouth Rinse BID  . Chlorhexidine Gluconate Cloth  6 each Topical Q0600  . enoxaparin (LOVENOX) injection  30 mg Subcutaneous Q24H  . fluconazole  100 mg Oral Daily  . insulin aspart  0-20 Units Subcutaneous TID WC  . insulin aspart  0-5 Units Subcutaneous QHS  . insulin detemir  5 Units Subcutaneous BID  . liver oil-zinc oxide   Topical BID  . mouth rinse  15 mL Mouth Rinse q12n4p  . mupirocin ointment  1 application Nasal BID  . nystatin   Topical TID  . potassium chloride  40 mEq Oral Daily   Continuous Infusions: . sodium chloride 10 mL/hr at 06/21/16 0829  . nafcillin IV Stopped (06/22/16 0441)  . potassium chloride 10 mEq (06/22/16 0819)  . vancomycin Stopped (06/21/16 2324)     LOS: 5 days   Time Spent in minutes   30 minutes  Masashi Snowdon D.O. on 06/22/2016 at 8:21 AM  Between 7am to 7pm - Pager - [phone]  After 7pm go to [email or web address] - password [email or web address]  And look for the night coverage person covering for me after hours  [facility] Office  [phone]

## 2016-06-22 NOTE — Progress Notes (Signed)
PATIENT ID: Randy [name]  MRN: [id number]  DOB/AGE:  07-31-1924 / 83 y.o.    Procedure(s) (LRB): TRANSESOPHAGEAL ECHOCARDIOGRAM (TEE) (N/A)    PROGRESS NOTE Subjective:   Patient is alert, oriented, no Nausea, no Vomiting, yes passing gas, yes Bowel Movement. Taking PO nectar thick liquids. Denies SOB, Chest or Calf Pain. Using [employer]. Pt is nonambulatory, Patient reports pain as mild.    Objective: Vital signs in last 24 hours: Temp:  [97.5 F (36.4 C)-98.3 F (36.8 C)] 97.5 F (36.4 C) (05/07 0300) Pulse Rate:  [65-89] 79 (05/07 0400) Resp:  [16-30] 27 (05/07 0400) BP: (118-138)/(44-113) 128/111 (05/07 0400) SpO2:  [97 %-100 %] 97 % (05/07 0400)    Intake/Output from previous day: I/O last 3 completed shifts: In: 4630.5 [P.O.:1290; I.V.:1540.5; IV Piggyback:1800] Out: 2005 [Urine:2005]   Intake/Output this shift: No intake/output data recorded.   LABORATORY DATA:  Recent Labs  06/21/16 0334  06/21/16 1227 06/21/16 1611 06/21/16 2127 06/22/16 0352  WBC 11.5*  --   --   --   --  10.6*  HGB 12.0*  --   --   --   --  11.3*  HCT 36.9*  --   --   --   --  34.8*  PLT 291  --   --   --   --  305  NA 141  --   --   --   --  139  K 3.0*  --   --   --   --  3.2*  CL 114*  --   --   --   --  111  CO2 19*  --   --   --   --  20*  BUN 21*  --   --   --   --  18  CREATININE 1.79*  --   --   --   --  1.83*  GLUCOSE 164*  --   --   --   --  123*  GLUCAP  --   < > 159* 216* 161*  --   CALCIUM 7.2*  --   --   --   --  7.2*  < > = values in this interval not displayed.  Examination: Neurologically intact Neurovascular intact Sensation intact distally Intact pulses distally Dorsiflexion/Plantar flexion intact} Pt continues to have limited ROM but with only mild pain with ROM.  Small effusion with  Minimal pain to palpation Assessment:     Procedure(s) (LRB): TRANSESOPHAGEAL ECHOCARDIOGRAM (TEE) (N/A) ADDITIONAL DIAGNOSIS:  Hypokalemia, Diabetes, Hypertension,  Cardiac Arrythmia Afib, Renal Insufficiency Chronic and Sepsis  Plan:  Ortho continues to recommend AKA.  It is our understanding that removal of pacer and TKA gives pt the best chance to clear his infection.  We will wait on medicine clearance.  I have asked pt to discuss these decisions with his son, Randy [name].     Emmali Karow R 06/22/2016, 7:31 AM

## 2016-06-22 NOTE — Progress Notes (Signed)
Date:  Jun 22, 2016  Chart reviewed for concurrent status and case management needs.  Will continue to follow patient progress. 1/2 blood cultures +enterococcus and MSSA. ID consulted. TEE planned for today 06/22/2016. Currently on vancomycin. Patient also has infected knee, ortho consulted. Discharge Planning: following for needs  Expected discharge date: [id number]  Velva Harman, BSN, [place], [place]

## 2016-06-22 NOTE — Progress Notes (Signed)
Subjective:  No new complaints    Antibiotics:  Anti-infectives    Start     Dose/Rate Route Frequency Ordered Stop   06/19/16 2100  nafcillin 2 g in dextrose 5 % 100 mL IVPB     2 g 200 mL/hr over 30 Minutes Intravenous Every 4 hours 06/18/16 2032     06/18/16 2200  vancomycin (VANCOCIN) IVPB 1000 mg/200 mL premix     1,000 mg 200 mL/hr over 60 Minutes Intravenous Every 24 hours 06/18/16 1702     06/18/16 2100  vancomycin (VANCOCIN) IVPB 750 mg/150 ml premix  Status:  Discontinued     750 mg 150 mL/hr over 60 Minutes Intravenous Every 24 hours 06/17/16 2103 06/17/16 2103   06/18/16 2100  vancomycin (VANCOCIN) IVPB 1000 mg/200 mL premix  Status:  Discontinued     1,000 mg 200 mL/hr over 60 Minutes Intravenous Every 24 hours 06/17/16 2103 06/18/16 1638   06/18/16 2100  nafcillin IVPB 2 g  Status:  Discontinued     2 g 200 mL/hr over 30 Minutes Intravenous Every 4 hours 06/18/16 2025 06/18/16 2026   06/18/16 2100  nafcillin 2 g in dextrose 5 % 50 mL IVPB     2 g 100 mL/hr over 30 Minutes Intravenous Every 4 hours 06/18/16 2027 06/19/16 1741   06/18/16 1700  nafcillin injection 2 g  Status:  Discontinued     2 g Intravenous Every 4 hours 06/18/16 1641 06/18/16 1650   06/18/16 1700  nafcillin 2 g in dextrose 5 % 100 mL IVPB  Status:  Discontinued     2 g 200 mL/hr over 30 Minutes Intravenous Every 4 hours 06/18/16 1650 06/18/16 2032   06/18/16 1600  fluconazole (DIFLUCAN) tablet 100 mg     100 mg Oral Daily 06/18/16 1516     06/18/16 0400  piperacillin-tazobactam (ZOSYN) IVPB 3.375 g  Status:  Discontinued     3.375 g 12.5 mL/hr over 240 Minutes Intravenous Every 8 hours 06/17/16 2103 06/18/16 1556   06/17/16 2045  piperacillin-tazobactam (ZOSYN) IVPB 3.375 g     3.375 g 100 mL/hr over 30 Minutes Intravenous  Once 06/17/16 2032 06/17/16 2152   06/17/16 2045  vancomycin (VANCOCIN) IVPB 1000 mg/200 mL premix     1,000 mg 200 mL/hr over 60 Minutes Intravenous  Once  06/17/16 2032 06/17/16 2334      Medications: Scheduled Meds: . chlorhexidine  15 mL Mouth Rinse BID  . Chlorhexidine Gluconate Cloth  6 each Topical Q0600  . enoxaparin (LOVENOX) injection  30 mg Subcutaneous Q24H  . fluconazole  100 mg Oral Daily  . insulin aspart  0-20 Units Subcutaneous TID WC  . insulin aspart  0-5 Units Subcutaneous QHS  . insulin detemir  5 Units Subcutaneous BID  . liver oil-zinc oxide   Topical BID  . mouth rinse  15 mL Mouth Rinse q12n4p  . mupirocin ointment  1 application Nasal BID  . nystatin   Topical TID  . potassium chloride  40 mEq Oral Daily   Continuous Infusions: . sodium chloride 10 mL/hr at 06/21/16 0829  . nafcillin IV Stopped (06/22/16 0441)  . potassium chloride 10 mEq (06/22/16 0459)  . vancomycin Stopped (06/21/16 2324)   PRN Meds:.hydrALAZINE    Objective: Weight change:   Intake/Output Summary (Last 24 hours) at 06/22/16 0626 Last data filed at 06/22/16 0459  Gross per 24 hour  Intake  2580.5 ml  Output             1055 ml  Net           1525.5 ml   Blood pressure (!) 128/111, pulse 79, temperature 97.5 F (36.4 C), temperature source Oral, resp. rate (!) 27, height 5\' 11"  (1.803 m), weight 206 lb 9.1 oz (93.7 kg), SpO2 97 %. Temp:  [97.5 F (36.4 C)-98.3 F (36.8 C)] 97.5 F (36.4 C) (05/07 0300) Pulse Rate:  [65-89] 79 (05/07 0400) Resp:  [16-30] 27 (05/07 0400) BP: (118-138)/(44-113) 128/111 (05/07 0400) SpO2:  [97 %-100 %] 97 % (05/07 0400)  Physical Exam: General: Alert and awake oriented to person, thinks he is in [place]: anicteric sclera, pupils reactive to light and accommodation, EOMI CVS regular rate, normal r,  no murmur rubs or gallops Chest: clear to auscultation bilaterally, no wheezing, rales or rhonchi Abdomen: soft nontender, edematous skin Extremities Right knee tender Skin: intertrigo  Neuro: nonfocal  CBC:  CBC Latest Ref Rng & Units 06/22/2016 06/21/2016 06/20/2016    WBC 4.0 - 10.5 K/uL 10.6(H) 11.5(H) 14.5(H)  Hemoglobin 13.0 - 17.0 g/dL 11.3(L) 12.0(L) 11.9(L)  Hematocrit 39.0 - 52.0 % 34.8(L) 36.9(L) 37.1(L)  Platelets 150 - 400 K/uL 305 291 279      BMET  Recent Labs  06/21/16 0334 06/22/16 0352  NA 141 139  K 3.0* 3.2*  CL 114* 111  CO2 19* 20*  GLUCOSE 164* 123*  BUN 21* 18  CREATININE 1.79* 1.83*  CALCIUM 7.2* 7.2*     Liver Panel  No results for input(s): PROT, ALBUMIN, AST, ALT, ALKPHOS, BILITOT, BILIDIR, IBILI in the last 72 hours.     Sedimentation Rate No results for input(s): ESRSEDRATE in the last 72 hours. C-Reactive Protein No results for input(s): CRP in the last 72 hours.  Micro Results: Recent Results (from the past 720 hour(s))  Blood Culture (routine x 2)     Status: Abnormal   Collection Time: 06/17/16  7:52 PM  Result Value Ref Range Status   Specimen Description BLOOD BLOOD LEFT FOREARM  Final   Special Requests IN PEDIATRIC BOTTLE Blood Culture adequate volume  Final   Culture  Setup Time   Final    GRAM POSITIVE COCCI IN CLUSTERS IN PEDIATRIC BOTTLE CRITICAL VALUE NOTED.  VALUE IS CONSISTENT WITH PREVIOUSLY REPORTED AND CALLED VALUE.    Culture (A)  Final    STAPHYLOCOCCUS SPECIES (COAGULASE NEGATIVE) THE SIGNIFICANCE OF ISOLATING THIS ORGANISM FROM A SINGLE SET OF BLOOD CULTURES WHEN MULTIPLE SETS ARE DRAWN IS UNCERTAIN. PLEASE NOTIFY THE MICROBIOLOGY DEPARTMENT WITHIN ONE WEEK IF SPECIATION AND SENSITIVITIES ARE REQUIRED. Performed at [facility], [place] [address] [place], [place] [address]    Report Status 06/19/2016 FINAL  Final  Blood Culture (routine x 2)     Status: Abnormal   Collection Time: 06/17/16  8:12 PM  Result Value Ref Range Status   Specimen Description BLOOD RIGHT ANTECUBITAL  Final   Special Requests   Final    BOTTLES DRAWN AEROBIC AND ANAEROBIC Blood Culture results may not be optimal due to an inadequate volume of blood received in culture bottles   Culture   Setup Time   Final    GRAM POSITIVE COCCI IN CHAINS IN BOTH AEROBIC AND ANAEROBIC BOTTLES CRITICAL RESULT CALLED TO, READ BACK BY AND VERIFIED WITH: N. Glogovac Pharm.D. 15:45 06/18/16 ([email or web address]) Performed at [facility], [place] [address] [place], [place]  [address]    Culture STAPHYLOCOCCUS AUREUS ENTEROCOCCUS FAECALIS  (A)  Final   Report Status 06/21/2016 FINAL  Final   Organism ID, Bacteria STAPHYLOCOCCUS AUREUS  Final   Organism ID, Bacteria ENTEROCOCCUS FAECALIS  Final      Susceptibility   Enterococcus faecalis - MIC*    AMPICILLIN <=2 SENSITIVE Sensitive     VANCOMYCIN <=0.5 SENSITIVE Sensitive     GENTAMICIN SYNERGY SENSITIVE Sensitive     * ENTEROCOCCUS FAECALIS   Staphylococcus aureus - MIC*    CIPROFLOXACIN <=0.5 SENSITIVE Sensitive     ERYTHROMYCIN <=0.25 SENSITIVE Sensitive     GENTAMICIN <=0.5 SENSITIVE Sensitive     OXACILLIN <=0.25 SENSITIVE Sensitive     TETRACYCLINE <=1 SENSITIVE Sensitive     VANCOMYCIN <=0.5 SENSITIVE Sensitive     TRIMETH/SULFA <=10 SENSITIVE Sensitive     CLINDAMYCIN <=0.25 SENSITIVE Sensitive     RIFAMPIN <=0.5 SENSITIVE Sensitive     Inducible Clindamycin NEGATIVE Sensitive     * STAPHYLOCOCCUS AUREUS  Blood Culture ID Panel (Reflexed)     Status: Abnormal   Collection Time: 06/17/16  8:12 PM  Result Value Ref Range Status   Enterococcus species DETECTED (A) NOT DETECTED Final    Comment: CRITICAL RESULT CALLED TO, READ BACK BY AND VERIFIED WITH: N. Glogovac Pharm.D. 15:45 06/18/16 ([email or web address])    Vancomycin resistance NOT DETECTED NOT DETECTED Final   Listeria monocytogenes NOT DETECTED NOT DETECTED Final   Staphylococcus species DETECTED (A) NOT DETECTED Final    Comment: CRITICAL RESULT CALLED TO, READ BACK BY AND VERIFIED WITH: N. Glogovac Pharm.D. 15:45 06/18/16 ([email or web address])    Staphylococcus aureus DETECTED (A) NOT DETECTED Final    Comment: Methicillin (oxacillin) susceptible Staphylococcus aureus (MSSA). Preferred therapy is anti  staphylococcal beta lactam antibiotic (Cefazolin or Nafcillin), unless clinically contraindicated. CRITICAL RESULT CALLED TO, READ BACK BY AND VERIFIED WITH: N. Glogovac Pharm.D. 15:45 06/18/16 ([email or web address])    Methicillin resistance NOT DETECTED NOT DETECTED Final   Streptococcus species NOT DETECTED NOT DETECTED Final   Streptococcus agalactiae NOT DETECTED NOT DETECTED Final   Streptococcus pneumoniae NOT DETECTED NOT DETECTED Final   Streptococcus pyogenes NOT DETECTED NOT DETECTED Final   Acinetobacter baumannii NOT DETECTED NOT DETECTED Final   Enterobacteriaceae species NOT DETECTED NOT DETECTED Final   Enterobacter cloacae complex NOT DETECTED NOT DETECTED Final   Escherichia coli NOT DETECTED NOT DETECTED Final   Klebsiella oxytoca NOT DETECTED NOT DETECTED Final   Klebsiella pneumoniae NOT DETECTED NOT DETECTED Final   Proteus species NOT DETECTED NOT DETECTED Final   Serratia marcescens NOT DETECTED NOT DETECTED Final   Haemophilus influenzae NOT DETECTED NOT DETECTED Final   Neisseria meningitidis NOT DETECTED NOT DETECTED Final   Pseudomonas aeruginosa NOT DETECTED NOT DETECTED Final   Candida albicans NOT DETECTED NOT DETECTED Final   Candida glabrata NOT DETECTED NOT DETECTED Final   Candida krusei NOT DETECTED NOT DETECTED Final   Candida parapsilosis NOT DETECTED NOT DETECTED Final   Candida tropicalis NOT DETECTED NOT DETECTED Final    Comment: Performed at [facility], [address] [place], [place] [address]  MRSA PCR Screening     Status: Abnormal   Collection Time: 06/18/16  1:40 AM  Result Value Ref Range Status   MRSA by PCR POSITIVE (A) NEGATIVE Final    Comment:        The GeneXpert MRSA Assay (FDA approved for NASAL specimens only), is one component of a comprehensive MRSA colonization surveillance program. It  is not intended to diagnose MRSA infection nor to guide or monitor treatment for MRSA infections. RESULT CALLED TO, READ BACK BY AND  VERIFIED WITH: C.SMITH,RN [id number] 06/18/16 W.SHEA   Culture, respiratory (NON-Expectorated)     Status: None   Collection Time: 06/18/16  6:32 AM  Result Value Ref Range Status   Specimen Description TRACHEAL ASPIRATE  Final   Special Requests NONE  Final   Gram Stain   Final    ABUNDANT WBC PRESENT, PREDOMINANTLY PMN RARE SQUAMOUS EPITHELIAL CELLS PRESENT MODERATE GRAM POSITIVE RODS GRAM POSITIVE COCCI IN PAIRS    Culture   Final    Consistent with normal respiratory flora. Performed at [facility], [place] [address] [place], [place] [address]    Report Status 06/20/2016 FINAL  Final  Culture, blood (Routine X 2) w Reflex to ID Panel     Status: None (Preliminary result)   Collection Time: 06/18/16  6:16 PM  Result Value Ref Range Status   Specimen Description BLOOD RIGHT ARM  Final   Special Requests IN PEDIATRIC BOTTLE Blood Culture adequate volume  Final   Culture   Final    NO GROWTH 3 DAYS Performed at [facility], [place] [address], [place], [place] [address]    Report Status PENDING  Incomplete  Culture, blood (Routine X 2) w Reflex to ID Panel     Status: Abnormal   Collection Time: 06/18/16  6:22 PM  Result Value Ref Range Status   Specimen Description BLOOD RIGHT ANTECUBITAL  Final   Special Requests IN PEDIATRIC BOTTLE Blood Culture adequate volume  Final   Culture  Setup Time   Final    GRAM POSITIVE COCCI IN CLUSTERS IN PEDIATRIC BOTTLE CRITICAL RESULT CALLED TO, READ BACK BY AND VERIFIED WITH: D ZEIGLER PHARMD 1932 06/19/16 A BROWNING    Culture (A)  Final    STAPHYLOCOCCUS AUREUS SUSCEPTIBILITIES PERFORMED ON PREVIOUS CULTURE WITHIN THE LAST 5 DAYS. Performed at [facility], [place] [address] [place], [place] [address]    Report Status 06/21/2016 FINAL  Final  Blood Culture ID Panel (Reflexed)     Status: Abnormal   Collection Time: 06/18/16  6:22 PM  Result Value Ref Range Status   Enterococcus species NOT DETECTED NOT DETECTED Final   Listeria  monocytogenes NOT DETECTED NOT DETECTED Final   Staphylococcus species DETECTED (A) NOT DETECTED Final    Comment: CRITICAL RESULT CALLED TO, READ BACK BY AND VERIFIED WITHMarcy Siren PHARMD 1932 06/19/16 A BROWNING    Staphylococcus aureus DETECTED (A) NOT DETECTED Final    Comment: Methicillin (oxacillin) susceptible Staphylococcus aureus (MSSA). Preferred therapy is anti staphylococcal beta lactam antibiotic (Cefazolin or Nafcillin), unless clinically contraindicated. CRITICAL RESULT CALLED TO, READ BACK BY AND VERIFIED WITHMarcy Siren PHARMD 1932 06/19/16 A BROWNING    Methicillin resistance NOT DETECTED NOT DETECTED Final   Streptococcus species NOT DETECTED NOT DETECTED Final   Streptococcus agalactiae NOT DETECTED NOT DETECTED Final   Streptococcus pneumoniae NOT DETECTED NOT DETECTED Final   Streptococcus pyogenes NOT DETECTED NOT DETECTED Final   Acinetobacter baumannii NOT DETECTED NOT DETECTED Final   Enterobacteriaceae species NOT DETECTED NOT DETECTED Final   Enterobacter cloacae complex NOT DETECTED NOT DETECTED Final   Escherichia coli NOT DETECTED NOT DETECTED Final   Klebsiella oxytoca NOT DETECTED NOT DETECTED Final   Klebsiella pneumoniae NOT DETECTED NOT DETECTED Final   Proteus species NOT DETECTED NOT DETECTED Final   Serratia marcescens NOT  DETECTED NOT DETECTED Final   Haemophilus influenzae NOT DETECTED NOT DETECTED Final   Neisseria meningitidis NOT DETECTED NOT DETECTED Final   Pseudomonas aeruginosa NOT DETECTED NOT DETECTED Final   Candida albicans NOT DETECTED NOT DETECTED Final   Candida glabrata NOT DETECTED NOT DETECTED Final   Candida krusei NOT DETECTED NOT DETECTED Final   Candida parapsilosis NOT DETECTED NOT DETECTED Final   Candida tropicalis NOT DETECTED NOT DETECTED Final    Comment: Performed at [facility], [place] [address] [place], [place] [address]  Culture, blood (Routine X 2) w Reflex to ID Panel     Status: None (Preliminary result)    Collection Time: 06/19/16 11:08 AM  Result Value Ref Range Status   Specimen Description BLOOD RIGHT ARM  Final   Special Requests IN PEDIATRIC BOTTLE Blood Culture adequate volume  Final   Culture   Final    NO GROWTH 2 DAYS Performed at [facility], [place] [address], [place], [place] [address]    Report Status PENDING  Incomplete  Culture, blood (Routine X 2) w Reflex to ID Panel     Status: None (Preliminary result)   Collection Time: 06/19/16 11:08 AM  Result Value Ref Range Status   Specimen Description BLOOD RIGHT HAND  Final   Special Requests IN PEDIATRIC BOTTLE Blood Culture adequate volume  Final   Culture   Final    NO GROWTH 2 DAYS Performed at [facility], [place] [address] [place], [place] [address]    Report Status PENDING  Incomplete  Body fluid culture     Status: None (Preliminary result)   Collection Time: 06/19/16 12:25 PM  Result Value Ref Range Status   Specimen Description SYNOVIAL RIGHT KNEE  Final   Special Requests NONE  Final   Gram Stain   Final    MODERATE WBC PRESENT,BOTH PMN AND MONONUCLEAR FEW GRAM POSITIVE COCCI IN PAIRS Performed at [facility], [place] [address] [place], [place] [address]    Culture MODERATE STAPHYLOCOCCUS AUREUS  Final   Report Status PENDING  Incomplete   Organism ID, Bacteria STAPHYLOCOCCUS AUREUS  Final      Susceptibility   Staphylococcus aureus - MIC*    CIPROFLOXACIN <=0.5 SENSITIVE Sensitive     ERYTHROMYCIN <=0.25 SENSITIVE Sensitive     GENTAMICIN <=0.5 SENSITIVE Sensitive     OXACILLIN <=0.25 SENSITIVE Sensitive     TETRACYCLINE <=1 SENSITIVE Sensitive     VANCOMYCIN <=0.5 SENSITIVE Sensitive     TRIMETH/SULFA <=10 SENSITIVE Sensitive     CLINDAMYCIN <=0.25 SENSITIVE Sensitive     RIFAMPIN <=0.5 SENSITIVE Sensitive     Inducible Clindamycin NEGATIVE Sensitive     * MODERATE STAPHYLOCOCCUS AUREUS    Studies/Results: No results found.    Assessment/Plan:  INTERVAL HISTORY:    Enterococcus S to AMP   Principal Problem:   Sepsis ([place]) Active Problems:   Hypertension   Diabetes mellitus ([name])   Chronic kidney disease (CKD), stage III (moderate)   Cough   Leukocytosis   Pacemaker   Pressure injury of skin   Staphylococcus aureus bacteremia with sepsis ([place])   Pacemaker infection ([place])   Enterococcal bacteremia   Late onset Alzheimer's disease without behavioral disturbance    Randy [name] is a 83 y.o. male with  Pacemaker + MSSA and enterococcal bacteremia = infected pacemaker +/- endocarditis, septic PJI of right knee  #1 MSSA and enterococcal bacteremia with infected PM  --he will  get TEE today --need input from EP, Cardiology  -continue UNASYN  CAN HIS PACEMAKER be removed, would he need temporary pacing in the interim?   Repeat blood cultures are NGTD  Would try to NOT place any NEW lines until PM is out, or we have long term plan for this device  PJI--certainly if not the source a likely nidus for persistent infection. Ortho planning on AKA to cure this.   Patient today tells me he "does not want more surgery"  He does not seem lucid enough to make that decision and given he is not able to walk   IN PARTICULAR if we are to make efforts to remove his PM and cure infection there I think it is critical to remove knee  (via AKA) as source or recurrent PM infections, bacteremias and sepsis  #2 Goals of care: would consider getting palliative involved given he is facing some highly morbid conditions with dementia at age of 11 and to ensure clarity on everything.  I will be at a conference this am (but available by pager) then out of town tomorrow. Dr. Linus Salmons will be covering me tomorrow.         LOS: 5 days   Alcide Evener 06/22/2016, 6:26 AM

## 2016-06-22 NOTE — Interval H&P Note (Signed)
History and Physical Interval Note:  06/22/2016 10:20 AM  Randy [name]  has presented today for surgery, with the diagnosis of BACTEREMIA  The various methods of treatment have been discussed with the patient and family. After consideration of risks, benefits and other options for treatment, the patient has consented to  Procedure(s): TRANSESOPHAGEAL ECHOCARDIOGRAM (TEE) (N/A) as a surgical intervention .  The patient's history has been reviewed, patient examined, no change in status, stable for surgery.  I have reviewed the patient's chart and labs.  Questions were answered to the patient's satisfaction.     Kirk Ruths

## 2016-06-22 NOTE — Progress Notes (Signed)
SLP Cancellation Note  Patient Details Name: Randy [name] MRN: [id number] DOB: April 19, 1924   Cancelled treatment:       Reason Eval/Treat Not Completed: Other (comment) (pt scheduled for TEE possibly today)  Luanna Salk, [facility] SLP [phone]  06/22/2016, 7:17 AM

## 2016-06-22 NOTE — Progress Notes (Signed)
Pharmacy Antibiotic Note  Randy [name] is a 83 y.o. male with pacemaker and prosthetic knee admitted on 06/17/2016 with sepsis. ID narrowing antibiotic therapy today from Nafcillin and Vancomyin to Unasyn for right prosthetic joint infection and bacteremia due to MSSA, CoNS, and ampicillin sensitive Enterococcus. TTE negative for vegetations, TEE pending for today. AKA recommended by Ortho. Patient also on Fluconazole per MD for candidiasis of groin area.   Today, 06/22/16:  - Afebrile - WBC elevated, but improving - SCr increased slightly to 1.83 with CrCl ~ 31 ml/min   Plan: - Unasyn 3g IV q6h. - Monitor renal function closely with rising SCr - Continue Fluconazole dosing per MD.  - F/u cultures, clinical course, TEE, further ID/Cardiology/Ortho recs.  _________________________  Height: 5\' 11"  (180.3 cm) Weight: 206 lb 9.1 oz (93.7 kg) IBW/kg (Calculated) : 75.3  Temp (24hrs), Avg:98 F (36.7 C), Min:97.5 F (36.4 C), Max:98.3 F (36.8 C)   Recent Labs Lab 06/17/16 2028 06/17/16 2353 06/18/16 0042 06/18/16 0277 06/18/16 4128 06/19/16 0334 06/20/16 0351 06/20/16 2116 06/21/16 0334 06/22/16 0352  WBC  --   --   --  18.7*  --  20.6* 14.5*  --  11.5* 10.6*  CREATININE  --   --   --  1.61*  --  1.71* 2.03*  --  1.79* 1.83*  LATICACIDVEN 4.42* 2.95* 5.4*  --  2.2*  --   --   --   --   --   VANCOTROUGH  --   --   --   --   --   --   --  14*  --   --     Estimated Creatinine Clearance: 31.4 mL/min (A) (by C-G formula based on SCr of 1.83 mg/dL (H)).    No Known Allergies  Antimicrobials this admission:  5/2 vanc >> 5/7 5/2 zosyn >> 5/3 5/3 naficillin >> 5/7 5/3 fluconazole >> 5/7 unasyn >>  Drug levels/Dose adjustments this admission:  5/5 2116 VT = 14 mcg/mL, no changes  Microbiology results:  5/2 BCx: 1/2 with MSSA, Enterococcus faecalis (ampicillin sensitive); 1/2 with CoNS 5/2 influenza PCR: neg 5/2 strep/legionella ur ag: neg 5/3 MRSA PCR: positive 5/3  Trach aspirate: c/w normal respiratory flora 5/3 BCx: 1/2 Staph aureus (BCID = MSSA) 5/4 synovial fluid from R knee: moderate MSSA 5/4 BCx: NGTD   Thank you for allowing pharmacy to be a part of this patient's care.    Lindell Spar, PharmD, BCPS Pager: [phone] 06/22/2016 8:37 AM

## 2016-06-22 NOTE — Consult Note (Signed)
CARDIOLOGY CONSULT NOTE   Patient ID: Randy [name] MRN: [id number] DOB/AGE: [birth year] 81 y.o.  Admit date: 06/17/2016  Primary Physician   System, Pcp Not In Primary Cardiologist   New, Dr Curt Bears Reason for Consultation   Bacteremia, PPM Requesting MD:   HPI: Randy [name] is a 83 y.o. male with hx of mild dementia/cognitive impairment, CAD with prior stent placement, history of atrial fibrillation S/P St Jude PPM (CHADS-Vasc score of 5 (age x 2, CAD, HTN, DM), no anticoagulation 2nd age and fall risk, HTN, HLD, DM, and CKD 3 with baseline creatinine around 1.3-1.5.   Pt admitted 05/02 with possible URI, knee infection and MS changes>>sepsis.   Pt is being seen today for the evaluation of possible PPM infection with blood Cx +MSSA, enterococcus and coag neg staph, at the request of Dr Ree Kida.  Mr Randy [name] is able to answer simple questions. He denies pain or problems at the [facility] site. He denies chest pain or SOB. He is not having any other significant pain, is resting comfortably. He denies palpitations. He is not having fevers or chills. He denies orthopnea or PND.  He is non-ambulatory at baseline. He has 24/7 caregivers. His R knee is s/p TKR>>infection w/ abx spacer and a replacement in 2016 when the first one became unstable. He has been seen by orthopedics this admission. Frank pus was aspirated from his knee and they recommend AKA.   2D echo was performed and no vegetations on his valves or pacer wire were seen, but the study was not adequate to assess this very well. TEE recommended>>attempted today but unable to pass the probe and procedure aborted.   Past Medical History:  Diagnosis Date  . Anxiety   . Arthritis    R knee,   . CKD (chronic kidney disease)   . Coronary artery disease   . Diabetes mellitus without complication ([place])   . Dysrhythmia    PAF;,sss s/p St. Jude PPM  . Hyperlipidemia   . Hypertension   . MI (myocardial infarction) ([place]) 2014  .  Presence of permanent cardiac pacemaker      Past Surgical History:  Procedure Laterality Date  . CHOLECYSTECTOMY    . CORONARY ANGIOPLASTY     mLAD '13, RCA DES 03/2013  . EXCISIONAL TOTAL KNEE ARTHROPLASTY WITH ANTIBIOTIC SPACERS Right 04/20/2014   Procedure: IRRIGATION AND DEBRIDMENT RIGHT TOTAL KNEE REMOVE  ACL IMPLANTED AND PLACE SPACER;  Surgeon: [name], MD;  Location: [facility];  Service: Orthopedics;  Laterality: Right;  . HEMORROIDECTOMY    . KNEE SURGERY  2006  . PACEMAKER INSERTION    . PICC LINE PLACE PERIPHERAL ([place] HX)  04/2014   R upper arm   . TOTAL KNEE ARTHROPLASTY WITH REVISION COMPONENTS Right 06/11/2014   Procedure: TOTAL KNEE ARTHROPLASTY WITH REVISION COMPONENTS REMOVE SPACER PLACE TKA;  Surgeon: [name], MD;  Location: [facility];  Service: Orthopedics;  Laterality: Right;    No Known Allergies  I have reviewed the patient's current medications . [MAR Hold] chlorhexidine  15 mL Mouth Rinse BID  . [MAR Hold] Chlorhexidine Gluconate Cloth  6 each Topical Q0600  . [MAR Hold] enoxaparin (LOVENOX) injection  30 mg Subcutaneous Q24H  . [MAR Hold] fluconazole  100 mg Oral Daily  . [MAR Hold] insulin aspart  0-20 Units Subcutaneous TID WC  . [MAR Hold] insulin aspart  0-5 Units Subcutaneous QHS  . [MAR Hold] insulin detemir  5 Units Subcutaneous BID  . [  MAR Hold] liver oil-zinc oxide   Topical BID  . [MAR Hold] mouth rinse  15 mL Mouth Rinse q12n4p  . [MAR Hold] mupirocin ointment  1 application Nasal BID  . [MAR Hold] nystatin   Topical TID  . [MAR Hold] potassium chloride  40 mEq Oral Daily   . sodium chloride 10 mL/hr at 06/22/16 1000  . [MAR Hold] ampicillin-sulbactam (UNASYN) IV Stopped (06/22/16 0945)  . [MAR Hold] magnesium sulfate 1 - 4 g bolus IVPB     [MAR Hold] hydrALAZINE  Prior to Admission medications   Pt had stopped all rx pta     Social History   Social History  . Marital status: Widowed    Spouse name: N/A  . Number of children: N/A  .  Years of education: N/A   Occupational History  . Retired    Social History Main Topics  . Smoking status: Former Smoker    Quit date: 06/07/1983  . Smokeless tobacco: Never Used  . Alcohol use No  . Drug use: No  . Sexual activity: No   Other Topics Concern  . Not on file   Social History Narrative   Has 24/7 caregivers.    Family Status  Relation Status  . Mother   . Father    Family History  Problem Relation Age of Onset  . Heart attack Mother   . Heart attack Father      ROS:  Full 14 point review of systems complete and found to be negative unless listed above.  Physical Exam: Blood pressure (!) 122/56, pulse 62, temperature 97.5 F (36.4 C), temperature source Oral, resp. rate 17, height 5\' 11"  (1.803 m), weight 206 lb 9.1 oz (93.7 kg), SpO2 96 %.  General: Well developed, well nourished, male in no acute distress Head: Eyes PERRLA, No xanthomas.   Normocephalic and atraumatic, oropharynx without edema or exudate. Dentition: poor Lungs: decreased BS bases Heart: HRRR S1 S2, no rub/gallop, 2/6 murmur. pulses are 2+ upper extrem.  Unable to palpate in both LE due to edema Neck: No carotid bruits. No lymphadenopathy.  JVD not elevated Abdomen: Bowel sounds present, abdomen soft and non-tender without masses or hernias noted. Msk:  No spine or cva tenderness. Severe generalized weakness, no joint deformities or effusions. Extremities: No clubbing or cyanosis. 2+ edema. Ecchymosis noted at and below R knee Neuro: Alert and oriented X 2. No focal deficits noted. Psych:  Good affect, responds appropriately  Labs:   Lab Results  Component Value Date   WBC 10.6 (H) 06/22/2016   HGB 11.3 (L) 06/22/2016   HCT 34.8 (L) 06/22/2016   MCV 85.1 06/22/2016   PLT 305 06/22/2016    Recent Labs Lab 06/17/16 2012  06/22/16 0352  NA 137  < > 139  K 4.5  < > 3.2*  CL 104  < > 111  CO2 22  < > 20*  BUN 21*  < > 18  CREATININE 1.87*  < > 1.83*  CALCIUM 8.7*  < > 7.2*   PROT 7.1  --   --   BILITOT 0.6  --   --   ALKPHOS 70  --   --   ALT 8*  --   --   AST 21  --   --   GLUCOSE 411*  < > 123*  ALBUMIN 3.3*  --   --   < > = values in this interval not displayed. Magnesium  Date Value Ref Range Status  06/22/2016 1.6 (L) 1.7 - 2.4 mg/dL Final   Echo: 06/19/2016 - Procedure narrative: Transthoracic echocardiography. Image   quality was poor. The study was technically difficult, as a   result of poor acoustic windows, poor sound wave transmission,   and restricted patient mobility. - Left ventricle: The cavity size was normal. Wall thickness was   increased in a pattern of mild LVH. There was moderate focal   basal hypertrophy of the septum. Systolic function was normal.   The estimated ejection fraction was in the range of 55% to 60%.   The study is not technically sufficient to allow evaluation of LV   diastolic function. - Aortic valve: Calcified and thickened leaflets. There is moderate   stenosis and trivial regurgitation. The right coronary cusp (RCC)   appears immobile. Valve area (VTI): 1.15 cm^2. Valve area (Vmax):   1.02 cm^2. Valve area (Vmean): 0.88 cm^2. - Aorta: Aortic root dimension: 40 mm (ED). - Aortic root: The aortic root is dilated. - Mitral valve: Mildly thickened leaflets . There was trivial regurgitation. - Left atrium: The atrium was mildly dilated. - Right ventricle: Pacer wire or catheter noted in right ventricle. - Right atrium: The atrium was at the upper limits of normal in   size. Pacer wire or catheter noted in right atrium. - Tricuspid valve: There was trivial regurgitation. - Pulmonic valve: Peak gradient (S): 29 mm Hg. - Pulmonary arteries: PA peak pressure: 18 mm Hg (S). - Inferior vena cava: The vessel was normal in size. The   respirophasic diameter changes were in the normal range (>= 50%),   consistent with normal central venous pressure. Impressions: - Technically difficult study. Definity contrast  given. LVEF stable   at 55-60%. There is moderae aortic stenosis and mild aortic root   dilitation to 4.0 cm. Pacemaker wires are noted. This study is   not sufficient to r/o valvular or hardware-associated vegetation.   Would recommend TEE.  ECG:  06/17/2016  AV pacing  Radiology:   Dg Chest 2 View Result Date: 06/17/2016 CLINICAL DATA:  Lethargy and cough EXAM: CHEST  2 VIEW COMPARISON:  04/23/2014 FINDINGS: Cardiac shadow is stable. Pacing device is again noted. The lungs are well aerated bilaterally. No bony abnormality is noted. IMPRESSION: No active cardiopulmonary disease. Electronically Signed   By: Inez Catalina M.D.   On: 06/17/2016 20:38   Ct Chest Wo Contrast Result Date: 06/18/2016 CLINICAL DATA:  Lethargy and cough for 2 days EXAM: CT CHEST WITHOUT CONTRAST TECHNIQUE: Multidetector CT imaging of the chest was performed following the standard protocol without IV contrast. COMPARISON:  06/17/2016, CT 04/17/2016 FINDINGS: Cardiovascular: Limited evaluation without intravenous contrast. Aortic atherosclerosis. No aneurysmal dilatation. Coronary artery calcification. Partially visualized cardiac pacing leads. Mild cardiomegaly. No large pericardial effusion. Mediastinum/Nodes: Midline trachea. Few prominent sub- carinal and periesophageal lymph nodes, measuring up to 13 mm. Esophagus within normal limits. Esophagus Lungs/Pleura: Mild subpleural fibrosis. No focal consolidation, pleural effusion, or pneumothorax. Upper Abdomen: No acute abnormality. 9.6 cm cyst in the mid to upper right kidney. Vague hypodensity in the upper pole of the left kidney also consistent with a cyst Musculoskeletal: Degenerative changes of the spine. No acute or suspicious bone lesion. IMPRESSION: 1. Mild subpleural fibrosis. No acute pulmonary infiltrates are visualized. 2. Mild nonspecific mediastinal adenopathy 3. Cardiomegaly 4. Bilateral kidney cysts, measuring up to 9.6 cm on the right. Electronically Signed   By:  Donavan Foil M.D.   On: 06/18/2016 00:26   Dg Swallowing Func-speech  Pathology Result Date: 06/19/2016 Objective Swallowing Evaluation: Type of Study: MBS-Modified Barium Swallow   Swallow eval ordered due to pt having dysphagia.  Subjective: pt awake in chair Assessment / Plan / Recommendation CHL IP CLINICAL IMPRESSIONS 06/19/2016 Clinical Impression Pt presents with severe pharyngeal dysphagia - narrow pharynx and decreased tongue base retraction results in very poor epiglottic deflection and gross pharyngeal/vallecular residuals.  Pt requires multiple swallows (up to 5-6) with each bolus and still has vallecular residuals. He did not aspirate or penetrate surprisingly.  Residuals were much worse with solids/pudding than liquids.  Various strategies/postures including head turn left, multiple swallows, following solid with liquids mitigate dysphagia.  Pt did not fully clear vallecular space but can fortunately hock and expectorate to clear.  Pt reports his swallow ability during MBS is "normal" for him causing SLP to suspect baseline significant dysphagia that he has been managing.  Recommend strict precautions to mitigate aspiration risk and modify diet to full liquids. Using teach back, pt able to demonstrate multiple swallows, "hocking" and oral suctioning independently.  Rashaun Wichert follow for po tolerance, readiness for dietary advancement.   ASSESSMENT AND PLAN:   The patient was seen today by Dr Curt Bears, the patient evaluated and the data reviewed.   1. Bacteremia, St Jude PPM in place: - TEE attempted but could not complete, aborted. - no vegetations on valves or PPM wires seen on 2D echo - Pt has infected R knee also - Due to pt age, general frailty and multiple comorbid conditions, he is not a candidate for PPM removal and replacement.  - MD advise if we need to get device interrogated so he can be followed appropriately. - Son is POA, he is a full code.  Otherwise, per IM, ID, Ortho Principal  Problem:   Sepsis ([place]) Active Problems:   Hypertension   Diabetes mellitus ([place])   Chronic kidney disease (CKD), stage III (moderate)   Cough   Leukocytosis   Pacemaker   Pressure injury of skin   Staphylococcus aureus bacteremia with sepsis ([place])   Pacemaker infection ([place])   Enterococcal bacteremia   Late onset Alzheimer's disease without behavioral disturbance   SignedLenoard Aden 06/22/2016 12:06 PM Beeper [phone]  Co-Sign MD  I have seen and examined this patient with Rosaria Ferries.  Agree with above, note added to reflect my findings.  On exam, RRR, no murmurs, crackles, 2+ LE edema. Presented to the hospital initially with confusion and SOB, found to have sepsis, likely from a right knee source. Has been on antibiotics and mental status clearing. He was found to have MSSA and enterococcus. TEE attempted today but unable to pass the TEE probe and thus the procedure was aborted. TTE performed but not sufficient to rule out hardware infection. Orthopedics recommends AKA for infection control. Did not see a pacemaker interrogation and thus do not know how old his leads are. That being said, there are notes about him being pacemaker dependent. Randell Teare have his device interrogated. That being said, he would be at very high risk of complication as he has multiple high risk comorbidities. He also has not had cardiac surgery which would have helped decrease complications. Would, at this time, plan for long term antibiotic therapy as his risks outweigh benefits. No plan at this time for device extraction. Agree with palliative consult as previously mentioned.   Whitten Andreoni M. Arleta Ostrum MD 06/22/2016 1:09 PM

## 2016-06-22 NOTE — Progress Notes (Signed)
Patient ID: Randy [name], male   DOB: 19-Nov-1924, 83 y.o.   MRN: [id number]  Subjective: Patient is resting comfortably in bed, reporting minimal discomfort in his right knee.  A transesophageal echocardiogram was attempted this morning and they were unable to pass the probe.  According to his nurse.  He will be treated with antibiotics.  Regarding his bacteremia and possible involvement of his heart valve.  Recall, his 2D echo was negative for any vegetations. In the last 48 hours.  He has remained afebrile, His white cell count has gone from 14,500-10,600 over the last 2 days.  Objective: The patient is a 1-2+ palpable effusion to the right knee.  He is one plus tender to palpation across the joint line.After obtaining informed consent, I sterilely prepped the lateral parapatellar region and aspirated out 60 cc of purulent material.  He is artery had a culture showing this is growing methicillin sensitive staph aureus, so repeat cultures were not sent.   Assessment: 83 year old man who came to the hospital bacteremic growing out staph and enterococcus that may have seeded or receded his right total knee.  Aspiration of the knee from 2 days ago is growing out methicillin sensitive staph.  He will be treated with antibiotics for possible cardiac involvement with the bacteremia.  He is a poor surgical candidate for anything except life-saving surgery.  Plan:.  This was discussed with the patient.  He does have mild dementia at this time he does not want to have an above-knee amputation, which will probably be the only surgery he may tolerate and would still carry risk.  We will see if the fluid re-accumulates between today and tomorrow and if it does, my recommendation for above-knee amputation will be stronger.  These findings will be discussed with his son.  [name], who has power of attorney.  I have left a voicemail message for him.

## 2016-06-22 NOTE — H&P (View-Only) (Signed)
PROGRESS NOTE    Randy [name]  WCB:[id number] DOB: 12/05/1924 DOA: 06/17/2016 PCP: System, Pcp Not In   Chief Complaint  Patient presents with  . Altered Mental Status  . Hyperglycemia  . Fever    Brief Narrative:  HPI on 06/17/2016 by Dr. Lily Kocher Randy [name] is a 83 y.o. gentleman with a history of mild dementia/cognitive impairment, CAD with prior stent placement, history of atrial fibrillation S/P PPM implant (CHADS-Vasc score of at least four, no anticoagulation), HTN, HLD, DM, and CKD 3 with baseline creatinine around 1.3-1.5 who was referred to the ED via EMS by one of his caregivers for evaluation of increased lethargy, mental status changes, and cough.  The patient is nonambulatory at baseline and spends most of his day sitting up a recliner.  He has caregiver support 24 hours/day.  He has had cough for 4-5 days.  He has had two known sick contacts (son-in-law and caregiver).  He has had yellow sputum.  No chest pain or shortness of breath.  No nausea or vomiting.  No abdominal pain.  No dysuria.  No diarrhea. Today, he was noted to have increased lethargy and muffled speech with slurring.  No other focal weakenss.  911 was called and the patient was transferred to the ED for evaluation. No recent hospitalizations.  No recent antibiotics.  He has been using OTC mucinex for his symptoms. No overt signs or symptoms of aspiration.  Interim history 1/2 blood cultures +enterococcus and MSSA. ID consulted. TEE planned for today 06/22/2016. Currently on vancomycin. Patient also has infected knee, ortho consulted.  Assessment & Plan   Sepsis secondary to MSSA/Enterococcus bacteremia/Right prosthetic knee infection -Suspected respiratory source vs groin cellulitis vs bacteremia -Presented with fever, tachycardia, tachypnea, leukocytosis -Patient also had elevated pro calcitonin level as well as lactic acid -Initially placed on  vancomycin and zosyn, zosyn discontinued on  06/18/16 -Continue vancomycin -Chest x-ray, UA unremarkable for infection   -CT chest showed subpleural fibrosis, no acute pulmonary infiltrates. -Influenza PCR, strep pneumonia urine antigen negative -Blood cultures 06/17/2016: MSSA, enterococcus  -Infectious disease consulted and appreciated, recommended TEE and EP consult -Cardiology consulted and appreciated, plan for TEE on 06/22/2016 -Echocardiogram showed EF 55-60% -Repeat blood cultures 06/19/2016 show no growth to date -orthopedic consulted and appreciated (Dr. Mayer Camel, as patient does have RTKA (2016) and has had infection before) -s/p aspiration of the right knee joint showed moderate Staph Aureus (pansensitive) -Plan would be for possible AKA.  Essential hypertension -Currently on no home medications -Continue to monitor -Continue IV hydralazine PRN  Diabetes mellitus, type II -Currently on no home medications -Continue insulin sliding scale, Levemir, CBG monitoring  History of atrial fibrillation -CHADSVASC 4 -Currently on no anticoagulation -Status post pacemaker placement- cardiology consulted as patient does have bacteremia  Acute on Chronic kidney disease, stage III -Creatinine baseline at 1.5 -Currently creatinine 1.83 -Continue to monitor BMP  Candidiasis  -Groin area appears to be erythematous and flaky -Continue diflucan  Stage II pressure injury -moisture related -wound care consulted and appreciated  Moderate aortic stenosis -Noted on echocardiogram, TEE planned for today 4 06/22/2016  Possible Dementia -Per son patient can sometimes be clear minded but is not always  Nutrition -Speech consulted and recommended nectar thick liquid -Speech asked to reevaluate and see if patient's diet can be advanced.  Right lower extremity edema -Likely secondary to infected TKR -LE doppler ordered  -Discontinued IVF as edema does appear to be worsening   Hypokalemia -continue to  replace and continue to monitor  BMP  Hypomagnesemia -Will replace and monitor  Goals of care -Had discuss with son via phone regarding patient, current treatment and plan, code status, mental status. Per son, patient's mental status is not always clear and at this time he would like to remain full code and possible have surgeries that might be needed. He has given consent for TEE.    DVT Prophylaxis  lovenox  Code Status: Full  Family Communication: None at bedside.   Disposition Plan: Admitted. Continue to monitor in stepdown. Pending TEE today 06/22/2016  Consultants Infectious disease Cardiology Orthopedics  Procedures  Echocardiogram Right knee aspiration   Antibiotics   Anti-infectives    Start     Dose/Rate Route Frequency Ordered Stop   06/19/16 2100  nafcillin 2 g in dextrose 5 % 100 mL IVPB     2 g 200 mL/hr over 30 Minutes Intravenous Every 4 hours 06/18/16 2032     06/18/16 2200  vancomycin (VANCOCIN) IVPB 1000 mg/200 mL premix     1,000 mg 200 mL/hr over 60 Minutes Intravenous Every 24 hours 06/18/16 1702     06/18/16 2100  vancomycin (VANCOCIN) IVPB 750 mg/150 ml premix  Status:  Discontinued     750 mg 150 mL/hr over 60 Minutes Intravenous Every 24 hours 06/17/16 2103 06/17/16 2103   06/18/16 2100  vancomycin (VANCOCIN) IVPB 1000 mg/200 mL premix  Status:  Discontinued     1,000 mg 200 mL/hr over 60 Minutes Intravenous Every 24 hours 06/17/16 2103 06/18/16 1638   06/18/16 2100  nafcillin IVPB 2 g  Status:  Discontinued     2 g 200 mL/hr over 30 Minutes Intravenous Every 4 hours 06/18/16 2025 06/18/16 2026   06/18/16 2100  nafcillin 2 g in dextrose 5 % 50 mL IVPB     2 g 100 mL/hr over 30 Minutes Intravenous Every 4 hours 06/18/16 2027 06/19/16 1741   06/18/16 1700  nafcillin injection 2 g  Status:  Discontinued     2 g Intravenous Every 4 hours 06/18/16 1641 06/18/16 1650   06/18/16 1700  nafcillin 2 g in dextrose 5 % 100 mL IVPB  Status:  Discontinued     2 g 200 mL/hr over 30 Minutes  Intravenous Every 4 hours 06/18/16 1650 06/18/16 2032   06/18/16 1600  fluconazole (DIFLUCAN) tablet 100 mg     100 mg Oral Daily 06/18/16 1516     06/18/16 0400  piperacillin-tazobactam (ZOSYN) IVPB 3.375 g  Status:  Discontinued     3.375 g 12.5 mL/hr over 240 Minutes Intravenous Every 8 hours 06/17/16 2103 06/18/16 1556   06/17/16 2045  piperacillin-tazobactam (ZOSYN) IVPB 3.375 g     3.375 g 100 mL/hr over 30 Minutes Intravenous  Once 06/17/16 2032 06/17/16 2152   06/17/16 2045  vancomycin (VANCOCIN) IVPB 1000 mg/200 mL premix     1,000 mg 200 mL/hr over 60 Minutes Intravenous  Once 06/17/16 2032 06/17/16 2334      Subjective:   Randy [name] seen and examined today.  Patient has no complaints today, would like to eat and drink something. Understands that he will have a procedure this morning. Denies current chest pain, shortness of breath, abdominal pain, nausea, vomiting, diarrhea, constipation.   Objective:   Vitals:   06/21/16 2328 06/22/16 0000 06/22/16 0300 06/22/16 0400  BP:  (!) 118/44  (!) 128/111  Pulse:  65  79  Resp:  (!) 26  (!) 27  Temp: 97.9  F (36.6 C)  97.5 F (36.4 C)   TempSrc: Oral  Oral   SpO2:  97%  97%  Weight:      Height:        Intake/Output Summary (Last 24 hours) at 06/22/16 0821 Last data filed at 06/22/16 0459  Gross per 24 hour  Intake           2480.5 ml  Output             1055 ml  Net           1425.5 ml   Filed Weights   06/17/16 1931 06/18/16 0056 06/20/16 0527  Weight: 81.6 kg (180 lb) 90.1 kg (198 lb 10.2 oz) 93.7 kg (206 lb 9.1 oz)    Exam   General: Well developed, well nourished, no apparent distress  HEENT: NCAT, mucous membranes moist, poor dentition.   Cardiovascular: S1 S2 auscultated, RRR, 2/6 SEM  Respiratory: Diminished breath sounds, no wheezing noted  Abdomen: Soft, nontender, nondistended, + bowel sounds  Extremities: RLE edema greater than LLE. Right knee TTP  Neuro: AAOx3, nonfocal  Skin: Groin  erythema, flaky skin (likely fungal infection)  Psych: Appropriate mood and affect, very pleasant    Data Reviewed: I have personally reviewed following labs and imaging studies  CBC:  Recent Labs Lab 06/17/16 2012 06/18/16 0812 06/19/16 0334 06/20/16 0351 06/21/16 0334 06/22/16 0352  WBC 20.1* 18.7* 20.6* 14.5* 11.5* 10.6*  NEUTROABS 15.7*  --   --   --   --   --   HGB 13.9 12.7* 13.2 11.9* 12.0* 11.3*  HCT 42.6 38.7* 39.8 37.1* 36.9* 34.8*  MCV 86.8 85.6 86.0 86.3 85.2 85.1  PLT 273 249 268 279 291 093   Basic Metabolic Panel:  Recent Labs Lab 06/18/16 0812 06/19/16 0334 06/20/16 0351 06/21/16 0334 06/22/16 0352  NA 140 141 143 141 139  K 4.0 3.6 3.1* 3.0* 3.2*  CL 109 110 114* 114* 111  CO2 21* 22 22 19* 20*  GLUCOSE 302* 209* 116* 164* 123*  BUN 20 20 23* 21* 18  CREATININE 1.61* 1.71* 2.03* 1.79* 1.83*  CALCIUM 8.0* 8.1* 7.6* 7.2* 7.2*  MG  --   --   --   --  1.6*  PHOS  --   --   --   --  2.0*   GFR: Estimated Creatinine Clearance: 31.4 mL/min (A) (by C-G formula based on SCr of 1.83 mg/dL (H)). Liver Function Tests:  Recent Labs Lab 06/17/16 2012  AST 21  ALT 8*  ALKPHOS 70  BILITOT 0.6  PROT 7.1  ALBUMIN 3.3*   No results for input(s): LIPASE, AMYLASE in the last 168 hours. No results for input(s): AMMONIA in the last 168 hours. Coagulation Profile:  Recent Labs Lab 06/18/16 0042  INR 1.29   Cardiac Enzymes: No results for input(s): CKTOTAL, CKMB, CKMBINDEX, TROPONINI in the last 168 hours. BNP (last 3 results) No results for input(s): PROBNP in the last 8760 hours. HbA1C: No results for input(s): HGBA1C in the last 72 hours. CBG:  Recent Labs Lab 06/21/16 0801 06/21/16 1227 06/21/16 1611 06/21/16 2127 06/22/16 0800  GLUCAP 134* 159* 216* 161* 123*   Lipid Profile: No results for input(s): CHOL, HDL, LDLCALC, TRIG, CHOLHDL, LDLDIRECT in the last 72 hours. Thyroid Function Tests: No results for input(s): TSH, T4TOTAL,  FREET4, T3FREE, THYROIDAB in the last 72 hours. Anemia Panel: No results for input(s): VITAMINB12, FOLATE, FERRITIN, TIBC, IRON, RETICCTPCT in the last 72  hours. Urine analysis:    Component Value Date/Time   COLORURINE YELLOW 06/17/2016 1959   APPEARANCEUR CLEAR 06/17/2016 1959   LABSPEC 1.020 06/17/2016 1959   PHURINE 5.0 06/17/2016 1959   GLUCOSEU >=500 (A) 06/17/2016 1959   HGBUR SMALL (A) 06/17/2016 1959   BILIRUBINUR NEGATIVE 06/17/2016 1959   KETONESUR 5 (A) 06/17/2016 1959   PROTEINUR 30 (A) 06/17/2016 1959   UROBILINOGEN 0.2 05/18/2014 1634   NITRITE NEGATIVE 06/17/2016 1959   LEUKOCYTESUR NEGATIVE 06/17/2016 1959   Sepsis Labs: @LABRCNTIP (procalcitonin:4,lacticidven:4)  ) Recent Results (from the past 240 hour(s))  Blood Culture (routine x 2)     Status: Abnormal   Collection Time: 06/17/16  7:52 PM  Result Value Ref Range Status   Specimen Description BLOOD BLOOD LEFT FOREARM  Final   Special Requests IN PEDIATRIC BOTTLE Blood Culture adequate volume  Final   Culture  Setup Time   Final    GRAM POSITIVE COCCI IN CLUSTERS IN PEDIATRIC BOTTLE CRITICAL VALUE NOTED.  VALUE IS CONSISTENT WITH PREVIOUSLY REPORTED AND CALLED VALUE.    Culture (A)  Final    STAPHYLOCOCCUS SPECIES (COAGULASE NEGATIVE) THE SIGNIFICANCE OF ISOLATING THIS ORGANISM FROM A SINGLE SET OF BLOOD CULTURES WHEN MULTIPLE SETS ARE DRAWN IS UNCERTAIN. PLEASE NOTIFY THE MICROBIOLOGY DEPARTMENT WITHIN ONE WEEK IF SPECIATION AND SENSITIVITIES ARE REQUIRED. Performed at [facility], [place] [address], [place], [place] [address]    Report Status 06/19/2016 FINAL  Final  Blood Culture (routine x 2)     Status: Abnormal   Collection Time: 06/17/16  8:12 PM  Result Value Ref Range Status   Specimen Description BLOOD RIGHT ANTECUBITAL  Final   Special Requests   Final    BOTTLES DRAWN AEROBIC AND ANAEROBIC Blood Culture results may not be optimal due to an inadequate volume of blood received in culture  bottles   Culture  Setup Time   Final    GRAM POSITIVE COCCI IN CHAINS IN BOTH AEROBIC AND ANAEROBIC BOTTLES CRITICAL RESULT CALLED TO, READ BACK BY AND VERIFIED WITH: N. Glogovac Pharm.D. 15:45 06/18/16 ([email or web address]) Performed at [facility], [place] [address], [place],  [address]    Culture STAPHYLOCOCCUS AUREUS ENTEROCOCCUS FAECALIS  (A)  Final   Report Status 06/21/2016 FINAL  Final   Organism ID, Bacteria STAPHYLOCOCCUS AUREUS  Final   Organism ID, Bacteria ENTEROCOCCUS FAECALIS  Final      Susceptibility   Enterococcus faecalis - MIC*    AMPICILLIN <=2 SENSITIVE Sensitive     VANCOMYCIN <=0.5 SENSITIVE Sensitive     GENTAMICIN SYNERGY SENSITIVE Sensitive     * ENTEROCOCCUS FAECALIS   Staphylococcus aureus - MIC*    CIPROFLOXACIN <=0.5 SENSITIVE Sensitive     ERYTHROMYCIN <=0.25 SENSITIVE Sensitive     GENTAMICIN <=0.5 SENSITIVE Sensitive     OXACILLIN <=0.25 SENSITIVE Sensitive     TETRACYCLINE <=1 SENSITIVE Sensitive     VANCOMYCIN <=0.5 SENSITIVE Sensitive     TRIMETH/SULFA <=10 SENSITIVE Sensitive     CLINDAMYCIN <=0.25 SENSITIVE Sensitive     RIFAMPIN <=0.5 SENSITIVE Sensitive     Inducible Clindamycin NEGATIVE Sensitive     * STAPHYLOCOCCUS AUREUS  Blood Culture ID Panel (Reflexed)     Status: Abnormal   Collection Time: 06/17/16  8:12 PM  Result Value Ref Range Status   Enterococcus species DETECTED (A) NOT DETECTED Final    Comment: CRITICAL RESULT CALLED TO, READ BACK BY AND VERIFIED WITH: N. Glogovac Pharm.D. 15:45 06/18/16 ([email or web address])  Vancomycin resistance NOT DETECTED NOT DETECTED Final   Listeria monocytogenes NOT DETECTED NOT DETECTED Final   Staphylococcus species DETECTED (A) NOT DETECTED Final    Comment: CRITICAL RESULT CALLED TO, READ BACK BY AND VERIFIED WITH: N. Glogovac Pharm.D. 15:45 06/18/16 ([email or web address])    Staphylococcus aureus DETECTED (A) NOT DETECTED Final    Comment: Methicillin (oxacillin) susceptible Staphylococcus aureus (MSSA).  Preferred therapy is anti staphylococcal beta lactam antibiotic (Cefazolin or Nafcillin), unless clinically contraindicated. CRITICAL RESULT CALLED TO, READ BACK BY AND VERIFIED WITH: N. Glogovac Pharm.D. 15:45 06/18/16 ([email or web address])    Methicillin resistance NOT DETECTED NOT DETECTED Final   Streptococcus species NOT DETECTED NOT DETECTED Final   Streptococcus agalactiae NOT DETECTED NOT DETECTED Final   Streptococcus pneumoniae NOT DETECTED NOT DETECTED Final   Streptococcus pyogenes NOT DETECTED NOT DETECTED Final   Acinetobacter baumannii NOT DETECTED NOT DETECTED Final   Enterobacteriaceae species NOT DETECTED NOT DETECTED Final   Enterobacter cloacae complex NOT DETECTED NOT DETECTED Final   Escherichia coli NOT DETECTED NOT DETECTED Final   Klebsiella oxytoca NOT DETECTED NOT DETECTED Final   Klebsiella pneumoniae NOT DETECTED NOT DETECTED Final   Proteus species NOT DETECTED NOT DETECTED Final   Serratia marcescens NOT DETECTED NOT DETECTED Final   Haemophilus influenzae NOT DETECTED NOT DETECTED Final   Neisseria meningitidis NOT DETECTED NOT DETECTED Final   Pseudomonas aeruginosa NOT DETECTED NOT DETECTED Final   Candida albicans NOT DETECTED NOT DETECTED Final   Candida glabrata NOT DETECTED NOT DETECTED Final   Candida krusei NOT DETECTED NOT DETECTED Final   Candida parapsilosis NOT DETECTED NOT DETECTED Final   Candida tropicalis NOT DETECTED NOT DETECTED Final    Comment: Performed at [facility], [address] [place], [place] [address]  MRSA PCR Screening     Status: Abnormal   Collection Time: 06/18/16  1:40 AM  Result Value Ref Range Status   MRSA by PCR POSITIVE (A) NEGATIVE Final    Comment:        The GeneXpert MRSA Assay (FDA approved for NASAL specimens only), is one component of a comprehensive MRSA colonization surveillance program. It is not intended to diagnose MRSA infection nor to guide or monitor treatment for MRSA infections. RESULT CALLED  TO, READ BACK BY AND VERIFIED WITH: C.SMITH,RN 3500 06/18/16 W.SHEA   Culture, respiratory (NON-Expectorated)     Status: None   Collection Time: 06/18/16  6:32 AM  Result Value Ref Range Status   Specimen Description TRACHEAL ASPIRATE  Final   Special Requests NONE  Final   Gram Stain   Final    ABUNDANT WBC PRESENT, PREDOMINANTLY PMN RARE SQUAMOUS EPITHELIAL CELLS PRESENT MODERATE GRAM POSITIVE RODS GRAM POSITIVE COCCI IN PAIRS    Culture   Final    Consistent with normal respiratory flora. Performed at [facility], [place] [address] [place], [place] [address]    Report Status 06/20/2016 FINAL  Final  Culture, blood (Routine X 2) w Reflex to ID Panel     Status: None (Preliminary result)   Collection Time: 06/18/16  6:16 PM  Result Value Ref Range Status   Specimen Description BLOOD RIGHT ARM  Final   Special Requests IN PEDIATRIC BOTTLE Blood Culture adequate volume  Final   Culture   Final    NO GROWTH 3 DAYS Performed at [facility], [place] [address], [place], [place] [address]    Report Status PENDING  Incomplete  Culture, blood (Routine X 2)  w Reflex to ID Panel     Status: Abnormal   Collection Time: 06/18/16  6:22 PM  Result Value Ref Range Status   Specimen Description BLOOD RIGHT ANTECUBITAL  Final   Special Requests IN PEDIATRIC BOTTLE Blood Culture adequate volume  Final   Culture  Setup Time   Final    GRAM POSITIVE COCCI IN CLUSTERS IN PEDIATRIC BOTTLE CRITICAL RESULT CALLED TO, READ BACK BY AND VERIFIED WITH: D ZEIGLER PHARMD 1932 06/19/16 A BROWNING    Culture (A)  Final    STAPHYLOCOCCUS AUREUS SUSCEPTIBILITIES PERFORMED ON PREVIOUS CULTURE WITHIN THE LAST 5 DAYS. Performed at [facility], [place] [address], [place], [place] [address]    Report Status 06/21/2016 FINAL  Final  Blood Culture ID Panel (Reflexed)     Status: Abnormal   Collection Time: 06/18/16  6:22 PM  Result Value Ref Range Status   Enterococcus species NOT DETECTED NOT  DETECTED Final   Listeria monocytogenes NOT DETECTED NOT DETECTED Final   Staphylococcus species DETECTED (A) NOT DETECTED Final    Comment: CRITICAL RESULT CALLED TO, READ BACK BY AND VERIFIED WITHMarcy Siren PHARMD 1932 06/19/16 A BROWNING    Staphylococcus aureus DETECTED (A) NOT DETECTED Final    Comment: Methicillin (oxacillin) susceptible Staphylococcus aureus (MSSA). Preferred therapy is anti staphylococcal beta lactam antibiotic (Cefazolin or Nafcillin), unless clinically contraindicated. CRITICAL RESULT CALLED TO, READ BACK BY AND VERIFIED WITHMarcy Siren PHARMD 1932 06/19/16 A BROWNING    Methicillin resistance NOT DETECTED NOT DETECTED Final   Streptococcus species NOT DETECTED NOT DETECTED Final   Streptococcus agalactiae NOT DETECTED NOT DETECTED Final   Streptococcus pneumoniae NOT DETECTED NOT DETECTED Final   Streptococcus pyogenes NOT DETECTED NOT DETECTED Final   Acinetobacter baumannii NOT DETECTED NOT DETECTED Final   Enterobacteriaceae species NOT DETECTED NOT DETECTED Final   Enterobacter cloacae complex NOT DETECTED NOT DETECTED Final   Escherichia coli NOT DETECTED NOT DETECTED Final   Klebsiella oxytoca NOT DETECTED NOT DETECTED Final   Klebsiella pneumoniae NOT DETECTED NOT DETECTED Final   Proteus species NOT DETECTED NOT DETECTED Final   Serratia marcescens NOT DETECTED NOT DETECTED Final   Haemophilus influenzae NOT DETECTED NOT DETECTED Final   Neisseria meningitidis NOT DETECTED NOT DETECTED Final   Pseudomonas aeruginosa NOT DETECTED NOT DETECTED Final   Candida albicans NOT DETECTED NOT DETECTED Final   Candida glabrata NOT DETECTED NOT DETECTED Final   Candida krusei NOT DETECTED NOT DETECTED Final   Candida parapsilosis NOT DETECTED NOT DETECTED Final   Candida tropicalis NOT DETECTED NOT DETECTED Final    Comment: Performed at [facility], [address] [place], [place] [address]  Culture, blood (Routine X 2) w Reflex to ID Panel     Status:  None (Preliminary result)   Collection Time: 06/19/16 11:08 AM  Result Value Ref Range Status   Specimen Description BLOOD RIGHT ARM  Final   Special Requests IN PEDIATRIC BOTTLE Blood Culture adequate volume  Final   Culture   Final    NO GROWTH 2 DAYS Performed at [facility], [place] [address] [place], [place] [address]    Report Status PENDING  Incomplete  Culture, blood (Routine X 2) w Reflex to ID Panel     Status: None (Preliminary result)   Collection Time: 06/19/16 11:08 AM  Result Value Ref Range Status   Specimen Description BLOOD RIGHT HAND  Final   Special Requests IN PEDIATRIC BOTTLE Blood  Culture adequate volume  Final   Culture   Final    NO GROWTH 2 DAYS Performed at [facility], [place] [address], [place], [place] [address]    Report Status PENDING  Incomplete  Body fluid culture     Status: None (Preliminary result)   Collection Time: 06/19/16 12:25 PM  Result Value Ref Range Status   Specimen Description SYNOVIAL RIGHT KNEE  Final   Special Requests NONE  Final   Gram Stain   Final    MODERATE WBC PRESENT,BOTH PMN AND MONONUCLEAR FEW GRAM POSITIVE COCCI IN PAIRS Performed at [facility], [place] [address] [place], [place] [address]    Culture MODERATE STAPHYLOCOCCUS AUREUS  Final   Report Status PENDING  Incomplete   Organism ID, Bacteria STAPHYLOCOCCUS AUREUS  Final      Susceptibility   Staphylococcus aureus - MIC*    CIPROFLOXACIN <=0.5 SENSITIVE Sensitive     ERYTHROMYCIN <=0.25 SENSITIVE Sensitive     GENTAMICIN <=0.5 SENSITIVE Sensitive     OXACILLIN <=0.25 SENSITIVE Sensitive     TETRACYCLINE <=1 SENSITIVE Sensitive     VANCOMYCIN <=0.5 SENSITIVE Sensitive     TRIMETH/SULFA <=10 SENSITIVE Sensitive     CLINDAMYCIN <=0.25 SENSITIVE Sensitive     RIFAMPIN <=0.5 SENSITIVE Sensitive     Inducible Clindamycin NEGATIVE Sensitive     * MODERATE STAPHYLOCOCCUS AUREUS      Radiology Studies: No results found.   Scheduled Meds: .  chlorhexidine  15 mL Mouth Rinse BID  . Chlorhexidine Gluconate Cloth  6 each Topical Q0600  . enoxaparin (LOVENOX) injection  30 mg Subcutaneous Q24H  . fluconazole  100 mg Oral Daily  . insulin aspart  0-20 Units Subcutaneous TID WC  . insulin aspart  0-5 Units Subcutaneous QHS  . insulin detemir  5 Units Subcutaneous BID  . liver oil-zinc oxide   Topical BID  . mouth rinse  15 mL Mouth Rinse q12n4p  . mupirocin ointment  1 application Nasal BID  . nystatin   Topical TID  . potassium chloride  40 mEq Oral Daily   Continuous Infusions: . sodium chloride 10 mL/hr at 06/21/16 0829  . nafcillin IV Stopped (06/22/16 0441)  . potassium chloride 10 mEq (06/22/16 0819)  . vancomycin Stopped (06/21/16 2324)     LOS: 5 days   Time Spent in minutes   30 minutes  Cristan Hout D.O. on 06/22/2016 at 8:21 AM  Between 7am to 7pm - Pager - [phone]  After 7pm go to [email or web address] - password [email or web address]  And look for the night coverage person covering for me after hours  [facility] Office  [phone]

## 2016-06-22 NOTE — Progress Notes (Addendum)
Nutrition Follow-up  DOCUMENTATION CODES:   Not applicable  INTERVENTION:  - Continue Magic Cup BID. - Will order Boost Breeze BID, each supplement provides 250 kcal and 9 grams of protein - Will monitor for diet order changes and additional nutrition-related needs.  NUTRITION DIAGNOSIS:   Unintentional weight loss related to acute illness as evidenced by percent weight loss. -ongoing  GOAL:   Patient will meet greater than or equal to 90% of their needs -unsure of intakes since admission.  MONITOR:   PO intake, Supplement acceptance, Diet advancement, Weight trends, Labs, Skin, I & O's  ASSESSMENT:   83 y.o. gentleman with a history of mild dementia/cognitive impairment, CAD with prior stent placement, history of atrial fibrillation S/P PPM implant (CHADS-Vasc score of at least four, no anticoagulation), HTN, HLD, DM, and CKD 3 with baseline creatinine around 1.3-1.5 who was referred to the ED via EMS by one of his caregivers for evaluation of increased lethargy, mental status changes, and cough.  The patient is nonambulatory at baseline and spends most of his day sitting up a recliner.  He has caregiver support 24 hours/day.  He has had cough for 4-5 days.  He has had two known sick contacts (son-in-law and caregiver).  He has had yellow sputum.  No chest pain or shortness of breath.  No nausea or vomiting.  No abdominal pain.  No dysuria.  No diarrhea.  5/7 Pt currently on FLD, thin liquids. He was at [facility] this AM for TEE and was NPO since midnight for the same. RN ordered lunch tray for pt ~20 minutes ago. Pt with some confusion throughout conversation but mainly answers seem appropriate. No family/visitors present at this time. No intakes documented since previous assessment and pt unable to recall with certainty if he has been eating meals/eating well at meals. He states that he is looking forward to lunch although he does not feel overtly hungry. He reports that he usually has a  good appetite and that he cooks for himself at home. He denies any abdominal pain or nausea. RN provided pt with a Boost Breeze shortly before RD visit; pt consumed 100% of this supplement and is interested in continuing to receive it.   Weight + 3.6 kg from 5/3-5/5 and no new weight since that time. Physical assessment done during this visit and shows moderated edema to BLE with no muscle and no fat wasting. Dr. Aldine Contes note from this AM states pt to possibly have AKA.   Medications reviewed; sliding scale Novolog, 5 units Levemir BID, 2 g IV Mg sulfate x1 run today, 10 mEq IV KCl x2 runs yesterday and x2 runs today, 40 mEq oral KCl/day.  Labs reviewed; CBG: 123 mg/dL today, K: 3.2 mmol/L, Phos: 2 mg/dL, Mg: 1.6 mg/dL, creatinine: 1.83 mg/dL, Ca: 7.2 mg/dL, GFR: 31 mL/min.    5/4 - Spoke with SLP who reports that pt likely with chronic dysphagia. - This RD had ordered Ensure Enlive BID yesterday after discussion with RN.  - Will d/c Ensure and will order Magic Cup BID with lunch and dinner trays, each supplement provides 290 kcal and 9 grams of protein.  5/3 - No intakes documented since admission.  - Pt sleeping at this time after RNs in to patients room x3 over the past half hour.  - Spoke with RN who reports no family/visitors have been present today.  - Pt has been very lethargic throughout the day and responds "yes" to all questions asked. - He has been  too lethargic to attempt to feed but she is going to try to give him something for dinner, if possible. - Unable to complete physical assessment at this time.  - Per chart review, pt has lost 12 lbs (5.7% body weight) in the past 2 months which is significant for time frame.    Diet Order:  Diet full liquid Room service appropriate? Yes; Fluid consistency: Thin  Skin:  Wound (see comment) (Stage 2 sacral pressure injury)  Last BM:  5/7  Height:   Ht Readings from Last 1 Encounters:  06/18/16 5\' 11"  (1.803 m)    Weight:    Wt Readings from Last 1 Encounters:  06/20/16 206 lb 9.1 oz (93.7 kg)    Ideal Body Weight:  78.18 kg  BMI:  Body mass index is 28.81 kg/m.  Estimated Nutritional Needs:   Kcal:  1620-1805 (18-20 kcal/kg)  Protein:  75-90 grams  Fluid:  1.8-2 L/day  EDUCATION NEEDS:   No education needs identified at this time    Randy Matin, MS, RD, LDN, CNSC Inpatient Clinical Dietitian Pager # [phone] After hours/weekend pager # [phone]

## 2016-06-23 ENCOUNTER — Encounter (HOSPITAL_COMMUNITY): Payer: Medicare Other

## 2016-06-23 ENCOUNTER — Inpatient Hospital Stay (HOSPITAL_COMMUNITY): Payer: Medicare Other

## 2016-06-23 DIAGNOSIS — Y792 Prosthetic and other implants, materials and accessory orthopedic devices associated with adverse incidents: Secondary | ICD-10-CM

## 2016-06-23 DIAGNOSIS — B952 Enterococcus as the cause of diseases classified elsewhere: Secondary | ICD-10-CM

## 2016-06-23 DIAGNOSIS — Z950 Presence of cardiac pacemaker: Secondary | ICD-10-CM

## 2016-06-23 DIAGNOSIS — T8453XA Infection and inflammatory reaction due to internal right knee prosthesis, initial encounter: Principal | ICD-10-CM

## 2016-06-23 DIAGNOSIS — B9561 Methicillin susceptible Staphylococcus aureus infection as the cause of diseases classified elsewhere: Secondary | ICD-10-CM

## 2016-06-23 DIAGNOSIS — E876 Hypokalemia: Secondary | ICD-10-CM

## 2016-06-23 DIAGNOSIS — M00061 Staphylococcal arthritis, right knee: Secondary | ICD-10-CM

## 2016-06-23 LAB — BASIC METABOLIC PANEL
ANION GAP: 6 (ref 5–15)
BUN: 16 mg/dL (ref 6–20)
CALCIUM: 7.5 mg/dL — AB (ref 8.9–10.3)
CO2: 21 mmol/L — ABNORMAL LOW (ref 22–32)
Chloride: 112 mmol/L — ABNORMAL HIGH (ref 101–111)
Creatinine, Ser: 1.89 mg/dL — ABNORMAL HIGH (ref 0.61–1.24)
GFR calc Af Amer: 34 mL/min — ABNORMAL LOW (ref >60)
GFR, EST NON AFRICAN AMERICAN: 30 mL/min — AB (ref >60)
Glucose, Bld: 119 mg/dL — ABNORMAL HIGH (ref 65–99)
POTASSIUM: 3 mmol/L — AB (ref 3.5–5.1)
Sodium: 139 mmol/L (ref 135–145)

## 2016-06-23 LAB — CBC
HCT: 33.9 % — ABNORMAL LOW (ref 39.0–52.0)
Hemoglobin: 11.1 g/dL — ABNORMAL LOW (ref 13.0–17.0)
MCH: 27.6 pg (ref 26.0–34.0)
MCHC: 32.7 g/dL (ref 30.0–36.0)
MCV: 84.3 fL (ref 78.0–100.0)
PLATELETS: 350 10*3/uL (ref 150–400)
RBC: 4.02 MIL/uL — AB (ref 4.22–5.81)
RDW: 16.5 % — AB (ref 11.5–15.5)
WBC: 11.2 10*3/uL — AB (ref 4.0–10.5)

## 2016-06-23 LAB — GLUCOSE, CAPILLARY
GLUCOSE-CAPILLARY: 198 mg/dL — AB (ref 65–99)
Glucose-Capillary: 108 mg/dL — ABNORMAL HIGH (ref 65–99)
Glucose-Capillary: 299 mg/dL — ABNORMAL HIGH (ref 65–99)
Glucose-Capillary: 273 mg/dL — ABNORMAL HIGH (ref 65–99)

## 2016-06-23 LAB — CULTURE, BLOOD (ROUTINE X 2)
CULTURE: NO GROWTH
SPECIAL REQUESTS: ADEQUATE

## 2016-06-23 LAB — MAGNESIUM: MAGNESIUM: 1.9 mg/dL (ref 1.7–2.4)

## 2016-06-23 MED ORDER — SODIUM CHLORIDE 0.9% FLUSH
10.0000 mL | Freq: Two times a day (BID) | INTRAVENOUS | Status: DC
  Administered 2016-06-23 – 2016-07-05 (×8): 10 mL

## 2016-06-23 MED ORDER — POTASSIUM CHLORIDE 20 MEQ/15ML (10%) PO SOLN
40.0000 meq | Freq: Two times a day (BID) | ORAL | Status: AC
  Administered 2016-06-23 (×2): 40 meq via ORAL
  Filled 2016-06-23 (×2): qty 30

## 2016-06-23 MED ORDER — CHLORHEXIDINE GLUCONATE CLOTH 2 % EX PADS
6.0000 | MEDICATED_PAD | Freq: Every day | CUTANEOUS | Status: DC
  Administered 2016-06-23 – 2016-06-25 (×3): 6 via TOPICAL

## 2016-06-23 MED ORDER — SODIUM CHLORIDE 0.9% FLUSH
10.0000 mL | INTRAVENOUS | Status: DC | PRN
  Administered 2016-06-25 – 2016-06-30 (×4): 10 mL
  Filled 2016-06-23 (×4): qty 40

## 2016-06-23 NOTE — Evaluation (Signed)
Physical Therapy Evaluation Patient Details Name: Randy [name] MRN: [id number] DOB: [birth year] Today's Date: 06/23/2016   History of Present Illness  Randy [name] is a 83 y.o. gentleman with a history of mild dementia/cognitive impairment, CAD with prior stent placement, history of atrial fibrillation S/P PPM implant  HTN, HLD, DM, and CKD ,infected right TKA(multiple surgeries.) was referred to the ED via EMS by one of his caregivers for evaluation of increased lethargy, mental status changes, and cough.  TPer historypatient is nonambulatory at baseline and spends most of his day sitting up a recliner. Aspiration of the  right knee performed.  Clinical Impression  The  Evaluation limited due to need to be cleaned up for BM. The patient appears confused about prior level of function. No family present.  Pt admitted with above diagnosis. Pt currently with functional limitations due to the deficits listed below (see PT Problem List). Pt will benefit from skilled PT to increase their independence and safety with mobility to allow discharge to the venue listed below.       Follow Up Recommendations Home health PT;SNF;Supervision/Assistance - 24 hour    Equipment Recommendations  None recommended by PT    Recommendations for Other Services       Precautions / Restrictions Precautions Precautions: Fall Precaution Comments: Flexisealm incontince.       Mobility  Bed Mobility Overal bed mobility: Needs Assistance Bed Mobility: Supine to Sit     Supine to sit: Mod assist;+2 for safety/equipment;HOB elevated     General bed mobility comments: bed placed in chair position. patient used rails to attempt sitting upright in bed. could not fully sit up. Noted leaking flexiseal so unable to attempt sitting up at bed edge.  Transfers                 General transfer comment: NT  Ambulation/Gait                Stairs            Wheelchair Mobility    Modified  Rankin (Stroke Patients Only)       Balance Overall balance assessment: Needs assistance Sitting-balance support: Bilateral upper extremity supported Sitting balance-Leahy Scale: Poor                                       Pertinent Vitals/Pain Pain Assessment: Faces Faces Pain Scale: No hurt    Home Living Family/patient expects to be discharged to:: Private residence Living Arrangements: Non-relatives/Friends Available Help at Discharge: Personal care attendant;Family   Home Access: Ramped entrance         Additional Comments: from previous  encounter, lives at home with invalid daughter ith 24/7 caregivers.    Prior Function           Comments: pt. report that he is up adlib, fixes breakfast. chart indicates nonambulatory.     Hand Dominance        Extremity/Trunk Assessment   Upper Extremity Assessment Upper Extremity Assessment: Defer to OT evaluation    Lower Extremity Assessment Lower Extremity Assessment: RLE deficits/detail;LLE deficits/detail RLE Deficits / Details: noted lower leg edema, able to raise leg from the bed. knee flexes about 40* LLE Deficits / Details: raises the leg from bed.        Communication      Cognition Arousal/Alertness: Awake/alert Behavior During Therapy: WFL for tasks assessed/performed Overall  Cognitive Status: No family/caregiver present to determine baseline cognitive functioning Area of Impairment: Orientation;Memory;Awareness                 Orientation Level: Place;Time;Situation             General Comments: patient reports that he is [place].      General Comments      Exercises     Assessment/Plan    PT Assessment Patient needs continued PT services  PT Problem List Decreased strength;Decreased range of motion;Decreased activity tolerance;Decreased balance;Decreased mobility;Decreased cognition       PT Treatment Interventions Functional mobility training;Therapeutic  activities;Therapeutic exercise;Patient/family education    PT Goals (Current goals can be found in the Care Plan section)  Acute Rehab PT Goals Patient Stated Goal: wants to walk PT Goal Formulation: Patient unable to participate in goal setting Time For Goal Achievement: 07/07/16 Potential to Achieve Goals: Fair    Frequency Min 3X/week   Barriers to discharge        Co-evaluation               AM-PAC PT "6 Clicks" Daily Activity  Outcome Measure Difficulty turning over in bed (including adjusting bedclothes, sheets and blankets)?: Total Difficulty moving from lying on back to sitting on the side of the bed? : Total Difficulty sitting down on and standing up from a chair with arms (e.g., wheelchair, bedside commode, etc,.)?: Total Help needed moving to and from a bed to chair (including a wheelchair)?: Total Help needed walking in hospital room?: Total Help needed climbing 3-5 steps with a railing? : Total 6 Click Score: 6    End of Session   Activity Tolerance: Patient tolerated treatment well Patient left: in bed;with call bell/phone within reach;with nursing/sitter in room Nurse Communication: Mobility status PT Visit Diagnosis: Muscle weakness (generalized) (M62.81)    Time: 6144-3154 PT Time Calculation (min) (ACUTE ONLY): 22 min   Charges:   PT Evaluation $PT Eval Low Complexity: 1 Procedure     PT G Codes:        {Teresia Myint PT [phone]  Claretha Cooper 06/23/2016, 3:28 PM

## 2016-06-23 NOTE — Progress Notes (Signed)
Patient ID: Randy [name], male   DOB: May 23, 1924, 83 y.o.   MRN: [id number]  Subjective: Randy [name] is sleeping comfortably when I saw him this morning.  He reports diminished pain in his right total knee which grew out methicillin sensitive staph.  He is now on Unasyn.  We aspirated 60 cc of purulent material yesterday and placed him in an Ace wrap.  Objective: The Ace wrap is removed.  He has not reaccumulated any fluid in his right total knee which is not warm to touch and there is little if any discomfort when you take him through a range of motion from 15-45.  Recall, he is essentially a nonambulator for the last 2 years secondary to multiple medical problems and dementia.  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  Plan: Situation was discussed yesterday with his son, Randy [name].  The plan going forward is attempt at suppression with antibiotics and if the fluid re-accumulates at that point we will once again consider above-knee amputation.  This was discussed with the patient who does have dementia.  I am not sure how much he was able to retain.

## 2016-06-23 NOTE — Progress Notes (Addendum)
PROGRESS NOTE    Randy [name]  DGU:[id number] DOB: Dec 04, 1924 DOA: 06/17/2016 PCP: System, Pcp Not In   Chief Complaint  Patient presents with  . Altered Mental Status  . Hyperglycemia  . Fever    Brief Narrative:  HPI on 06/17/2016 by Dr. Lily Kocher Randy [name] is a 83 y.o. gentleman with a history of mild dementia/cognitive impairment, CAD with prior stent placement, history of atrial fibrillation S/P PPM implant (CHADS-Vasc score of at least four, no anticoagulation), HTN, HLD, DM, and CKD 3 with baseline creatinine around 1.3-1.5 who was referred to the ED via EMS by one of his caregivers for evaluation of increased lethargy, mental status changes, and cough.  The patient is nonambulatory at baseline and spends most of his day sitting up a recliner.  He has caregiver support 24 hours/day.  He has had cough for 4-5 days.  He has had two known sick contacts (son-in-law and caregiver).  He has had yellow sputum.  No chest pain or shortness of breath.  No nausea or vomiting.  No abdominal pain.  No dysuria.  No diarrhea. Today, he was noted to have increased lethargy and muffled speech with slurring.  No other focal weakenss.  911 was called and the patient was transferred to the ED for evaluation. No recent hospitalizations.  No recent antibiotics.  He has been using OTC mucinex for his symptoms. No overt signs or symptoms of aspiration.  Interim history 1/2 blood cultures +enterococcus and MSSA. ID consulted. TEE planned attempted on 06/22/2016-unsuccessful. Currently on Unasyn. Patient also has infected knee, ortho consulted.  Assessment & Plan   Sepsis secondary to MSSA/Enterococcus bacteremia/Right prosthetic knee infection -Suspected respiratory source vs groin cellulitis vs bacteremia -Presented with fever, tachycardia, tachypnea, leukocytosis -Patient also had elevated pro calcitonin level as well as lactic acid -Initially placed on  vancomycin and zosyn, and transitioned to  Unasyn -Chest x-ray, UA unremarkable for infection   -CT chest showed subpleural fibrosis, no acute pulmonary infiltrates. -Influenza PCR, strep pneumonia urine antigen negative -Blood cultures 06/17/2016: MSSA, enterococcus  -Repeat blood cultures 06/19/16 no growth to date -Echocardiogram showed EF 55-60% -Infectious disease consulted and appreciated, recommended TEE and EP consult -Cardiology consulted and appreciated- TEE attempted multiple times on 06/22/16- may need repeat attempt with general anesthesia vs long term IV antibiotics -Per cardiology, patient is at high risk of complication due to high risk comorbidities. No plan for device extraction at this time. -Orthopedic surgery consulted and appreciated for infected knee. S/p aspiration x2, patient had pansensitive Staph aureus. -Patient seen by Dr. Mayer Camel (ortho), patient would need an AKA, however patient seen as a poor surgical candidate. Ortho has discussed this with the patient's son.  If fluid re-accumulates, consideration for AKA. Continue IV antibiotics -Discussed case with infectious disease. Patient will need IV unasyn for 6 weeks, and followed by PO keflex for suppression thereafter. -Will order PICC line today.   -Cardio and ortho both recommend palliative care consult.  Essential hypertension -Currently on no home medications, continue IV hydralazine PRN  Diabetes mellitus, type II -Currently on no home medications -Continue insulin sliding scale, Levemir, CBG monitoring  History of atrial fibrillation -CHADSVASC 5 (based on age, DM, HTN, CAD) -Currently on no anticoagulation -Status post pacemaker placement- cardiology consulted as patient does have bacteremia, as above, no plan for device extraction at this time.   Acute on Chronic kidney disease, stage III -Creatinine baseline at 1.5 -Currently creatinine 1.89 -Continue to monitor BMP  Candidiasis  -  Groin area appears to be erythematous and flaky -Continue  diflucan  Stage II pressure injury -moisture related -wound care consulted and appreciated  Moderate aortic stenosis -Noted on echocardiogram  Possible Dementia -Per son patient can sometimes be clear minded but is not always  Nutrition -Speech consulted and recommended nectar thick liquid -Speech asked to reevaluate and see if patient's diet can be advanced.  Right lower extremity edema -Likely secondary to infected TKR -LE doppler pending -Discontinued IVF as edema does appear to be worsening   Hypokalemia -K 3.0, continue to repletion -Monitor BMP  Hypomagnesemia -Magnesium 1.9, continue to monitor and replace as needed  Goals of care -Had discuss with son via phone regarding patient, current treatment and plan, code status, mental status. Per son, patient's mental status is not always clear and at this time he would like to remain full code and possible have surgeries that might be needed. -Cardio and ortho recommend palliative care consult. Discussed with son, does not feel palliative care consult is needed right now.  DVT Prophylaxis  lovenox  Code Status: Full  Family Communication: None at bedside. Son via phone.   Disposition Plan: Admitted. Continue to monitor in stepdown. Will order PICC. D/C to SNF probably within the next 48-72 hours depending on hospital course.  Consultants Infectious disease Cardiology Orthopedics  Procedures  Echocardiogram Right knee aspiration x2 Attempted TEE  Antibiotics   Anti-infectives    Start     Dose/Rate Route Frequency Ordered Stop   06/22/16 1000  Ampicillin-Sulbactam (UNASYN) 3 g in sodium chloride 0.9 % 100 mL IVPB     3 g 200 mL/hr over 30 Minutes Intravenous Every 6 hours 06/22/16 0839     06/19/16 2100  nafcillin 2 g in dextrose 5 % 100 mL IVPB  Status:  Discontinued     2 g 200 mL/hr over 30 Minutes Intravenous Every 4 hours 06/18/16 2032 06/22/16 0837   06/18/16 2200  vancomycin (VANCOCIN) IVPB 1000  mg/200 mL premix  Status:  Discontinued     1,000 mg 200 mL/hr over 60 Minutes Intravenous Every 24 hours 06/18/16 1702 06/22/16 0837   06/18/16 2100  vancomycin (VANCOCIN) IVPB 750 mg/150 ml premix  Status:  Discontinued     750 mg 150 mL/hr over 60 Minutes Intravenous Every 24 hours 06/17/16 2103 06/17/16 2103   06/18/16 2100  vancomycin (VANCOCIN) IVPB 1000 mg/200 mL premix  Status:  Discontinued     1,000 mg 200 mL/hr over 60 Minutes Intravenous Every 24 hours 06/17/16 2103 06/18/16 1638   06/18/16 2100  nafcillin IVPB 2 g  Status:  Discontinued     2 g 200 mL/hr over 30 Minutes Intravenous Every 4 hours 06/18/16 2025 06/18/16 2026   06/18/16 2100  nafcillin 2 g in dextrose 5 % 50 mL IVPB     2 g 100 mL/hr over 30 Minutes Intravenous Every 4 hours 06/18/16 2027 06/19/16 1741   06/18/16 1700  nafcillin injection 2 g  Status:  Discontinued     2 g Intravenous Every 4 hours 06/18/16 1641 06/18/16 1650   06/18/16 1700  nafcillin 2 g in dextrose 5 % 100 mL IVPB  Status:  Discontinued     2 g 200 mL/hr over 30 Minutes Intravenous Every 4 hours 06/18/16 1650 06/18/16 2032   06/18/16 1600  fluconazole (DIFLUCAN) tablet 100 mg     100 mg Oral Daily 06/18/16 1516     06/18/16 0400  piperacillin-tazobactam (ZOSYN) IVPB 3.375 g  Status:  Discontinued     3.375 g 12.5 mL/hr over 240 Minutes Intravenous Every 8 hours 06/17/16 2103 06/18/16 1556   06/17/16 2045  piperacillin-tazobactam (ZOSYN) IVPB 3.375 g     3.375 g 100 mL/hr over 30 Minutes Intravenous  Once 06/17/16 2032 06/17/16 2152   06/17/16 2045  vancomycin (VANCOCIN) IVPB 1000 mg/200 mL premix     1,000 mg 200 mL/hr over 60 Minutes Intravenous  Once 06/17/16 2032 06/17/16 2334      Subjective:   Randy [name] seen and examined today. No complaints today. Would like to eat. Denies chest pain, shortness of breath, abdominal pain, nausea or vomiting.    Objective:   Vitals:   06/22/16 2000 06/23/16 0000 06/23/16 0518 06/23/16  0529  BP: (!) 143/78 (!) 127/50 (!) 131/56   Pulse: 78 76 63   Resp: (!) 26 (!) 26 (!) 25   Temp:  98.1 F (36.7 C)  97.7 F (36.5 C)  TempSrc:  Oral  Oral  SpO2: 97% 97% 98%   Weight:    96.3 kg (212 lb 4.9 oz)  Height:        Intake/Output Summary (Last 24 hours) at 06/23/16 1044 Last data filed at 06/23/16 0300  Gross per 24 hour  Intake              470 ml  Output              400 ml  Net               70 ml   Filed Weights   06/18/16 0056 06/20/16 0527 06/23/16 0529  Weight: 90.1 kg (198 lb 10.2 oz) 93.7 kg (206 lb 9.1 oz) 96.3 kg (212 lb 4.9 oz)    Exam   General: Well developed, elderly, well developed, NAD  HEENT: NCAT, mucous membranes moist, poor dentition.   Cardiovascular: S1 S2 auscultated, regular rate rhythm, 2/6SEM  Respiratory: Diminished but clear breath sounds   Abdomen: Soft, nontender, nondistended, + bowel sounds  Extremities: 2+RLE edema greater than LLE. Right knee TTP  Neuro: AAOx3 (self, place, situation), nonfocal  Skin: Groin erythema, flaky skin (likely fungal infection)  Psych: Normal mood and affect, pleasant   Data Reviewed: I have personally reviewed following labs and imaging studies  CBC:  Recent Labs Lab 06/17/16 2012  06/19/16 0334 06/20/16 0351 06/21/16 0334 06/22/16 0352 06/23/16 0331  WBC 20.1*  < > 20.6* 14.5* 11.5* 10.6* 11.2*  NEUTROABS 15.7*  --   --   --   --   --   --   HGB 13.9  < > 13.2 11.9* 12.0* 11.3* 11.1*  HCT 42.6  < > 39.8 37.1* 36.9* 34.8* 33.9*  MCV 86.8  < > 86.0 86.3 85.2 85.1 84.3  PLT 273  < > 268 279 291 305 350  < > = values in this interval not displayed. Basic Metabolic Panel:  Recent Labs Lab 06/19/16 0334 06/20/16 0351 06/21/16 0334 06/22/16 0352 06/23/16 0331 06/23/16 0349  NA 141 143 141 139 139  --   K 3.6 3.1* 3.0* 3.2* 3.0*  --   CL 110 114* 114* 111 112*  --   CO2 22 22 19* 20* 21*  --   GLUCOSE 209* 116* 164* 123* 119*  --   BUN 20 23* 21* 18 16  --   CREATININE  1.71* 2.03* 1.79* 1.83* 1.89*  --   CALCIUM 8.1* 7.6* 7.2* 7.2* 7.5*  --   MG  --   --   --  1.6*  --  1.9  PHOS  --   --   --  2.0*  --   --    GFR: Estimated Creatinine Clearance: 30.8 mL/min (A) (by C-G formula based on SCr of 1.89 mg/dL (H)). Liver Function Tests:  Recent Labs Lab 06/17/16 2012  AST 21  ALT 8*  ALKPHOS 70  BILITOT 0.6  PROT 7.1  ALBUMIN 3.3*   No results for input(s): LIPASE, AMYLASE in the last 168 hours. No results for input(s): AMMONIA in the last 168 hours. Coagulation Profile:  Recent Labs Lab 06/18/16 0042  INR 1.29   Cardiac Enzymes: No results for input(s): CKTOTAL, CKMB, CKMBINDEX, TROPONINI in the last 168 hours. BNP (last 3 results) No results for input(s): PROBNP in the last 8760 hours. HbA1C: No results for input(s): HGBA1C in the last 72 hours. CBG:  Recent Labs Lab 06/22/16 0800 06/22/16 1246 06/22/16 1610 06/22/16 2206 06/23/16 0805  GLUCAP 123* 93 261* 120* 108*   Lipid Profile: No results for input(s): CHOL, HDL, LDLCALC, TRIG, CHOLHDL, LDLDIRECT in the last 72 hours. Thyroid Function Tests: No results for input(s): TSH, T4TOTAL, FREET4, T3FREE, THYROIDAB in the last 72 hours. Anemia Panel: No results for input(s): VITAMINB12, FOLATE, FERRITIN, TIBC, IRON, RETICCTPCT in the last 72 hours. Urine analysis:    Component Value Date/Time   COLORURINE YELLOW 06/17/2016 1959   APPEARANCEUR CLEAR 06/17/2016 1959   LABSPEC 1.020 06/17/2016 1959   PHURINE 5.0 06/17/2016 1959   GLUCOSEU >=500 (A) 06/17/2016 1959   HGBUR SMALL (A) 06/17/2016 1959   BILIRUBINUR NEGATIVE 06/17/2016 1959   KETONESUR 5 (A) 06/17/2016 1959   PROTEINUR 30 (A) 06/17/2016 1959   UROBILINOGEN 0.2 05/18/2014 1634   NITRITE NEGATIVE 06/17/2016 1959   LEUKOCYTESUR NEGATIVE 06/17/2016 1959   Sepsis Labs: @LABRCNTIP (procalcitonin:4,lacticidven:4)  ) Recent Results (from the past 240 hour(s))  Blood Culture (routine x 2)     Status: Abnormal    Collection Time: 06/17/16  7:52 PM  Result Value Ref Range Status   Specimen Description BLOOD BLOOD LEFT FOREARM  Final   Special Requests IN PEDIATRIC BOTTLE Blood Culture adequate volume  Final   Culture  Setup Time   Final    GRAM POSITIVE COCCI IN CLUSTERS IN PEDIATRIC BOTTLE CRITICAL VALUE NOTED.  VALUE IS CONSISTENT WITH PREVIOUSLY REPORTED AND CALLED VALUE.    Culture (A)  Final    STAPHYLOCOCCUS SPECIES (COAGULASE NEGATIVE) THE SIGNIFICANCE OF ISOLATING THIS ORGANISM FROM A SINGLE SET OF BLOOD CULTURES WHEN MULTIPLE SETS ARE DRAWN IS UNCERTAIN. PLEASE NOTIFY THE MICROBIOLOGY DEPARTMENT WITHIN ONE WEEK IF SPECIATION AND SENSITIVITIES ARE REQUIRED. Performed at [facility], [place] [address], [place], [place] [address]    Report Status 06/19/2016 FINAL  Final  Blood Culture (routine x 2)     Status: Abnormal   Collection Time: 06/17/16  8:12 PM  Result Value Ref Range Status   Specimen Description BLOOD RIGHT ANTECUBITAL  Final   Special Requests   Final    BOTTLES DRAWN AEROBIC AND ANAEROBIC Blood Culture results may not be optimal due to an inadequate volume of blood received in culture bottles   Culture  Setup Time   Final    GRAM POSITIVE COCCI IN CHAINS IN BOTH AEROBIC AND ANAEROBIC BOTTLES CRITICAL RESULT CALLED TO, READ BACK BY AND VERIFIED WITH: N. Glogovac Pharm.D. 15:45 06/18/16 ([email or web address]) Performed at [facility], [place] [address], [place], [place] [address]    Culture STAPHYLOCOCCUS AUREUS ENTEROCOCCUS FAECALIS  (  A)  Final   Report Status 06/21/2016 FINAL  Final   Organism ID, Bacteria STAPHYLOCOCCUS AUREUS  Final   Organism ID, Bacteria ENTEROCOCCUS FAECALIS  Final      Susceptibility   Enterococcus faecalis - MIC*    AMPICILLIN <=2 SENSITIVE Sensitive     VANCOMYCIN <=0.5 SENSITIVE Sensitive     GENTAMICIN SYNERGY SENSITIVE Sensitive     * ENTEROCOCCUS FAECALIS   Staphylococcus aureus - MIC*    CIPROFLOXACIN <=0.5 SENSITIVE Sensitive      ERYTHROMYCIN <=0.25 SENSITIVE Sensitive     GENTAMICIN <=0.5 SENSITIVE Sensitive     OXACILLIN <=0.25 SENSITIVE Sensitive     TETRACYCLINE <=1 SENSITIVE Sensitive     VANCOMYCIN <=0.5 SENSITIVE Sensitive     TRIMETH/SULFA <=10 SENSITIVE Sensitive     CLINDAMYCIN <=0.25 SENSITIVE Sensitive     RIFAMPIN <=0.5 SENSITIVE Sensitive     Inducible Clindamycin NEGATIVE Sensitive     * STAPHYLOCOCCUS AUREUS  Blood Culture ID Panel (Reflexed)     Status: Abnormal   Collection Time: 06/17/16  8:12 PM  Result Value Ref Range Status   Enterococcus species DETECTED (A) NOT DETECTED Final    Comment: CRITICAL RESULT CALLED TO, READ BACK BY AND VERIFIED WITH: N. Glogovac Pharm.D. 15:45 06/18/16 ([email or web address])    Vancomycin resistance NOT DETECTED NOT DETECTED Final   Listeria monocytogenes NOT DETECTED NOT DETECTED Final   Staphylococcus species DETECTED (A) NOT DETECTED Final    Comment: CRITICAL RESULT CALLED TO, READ BACK BY AND VERIFIED WITH: N. Glogovac Pharm.D. 15:45 06/18/16 ([email or web address])    Staphylococcus aureus DETECTED (A) NOT DETECTED Final    Comment: Methicillin (oxacillin) susceptible Staphylococcus aureus (MSSA). Preferred therapy is anti staphylococcal beta lactam antibiotic (Cefazolin or Nafcillin), unless clinically contraindicated. CRITICAL RESULT CALLED TO, READ BACK BY AND VERIFIED WITH: N. Glogovac Pharm.D. 15:45 06/18/16 ([email or web address])    Methicillin resistance NOT DETECTED NOT DETECTED Final   Streptococcus species NOT DETECTED NOT DETECTED Final   Streptococcus agalactiae NOT DETECTED NOT DETECTED Final   Streptococcus pneumoniae NOT DETECTED NOT DETECTED Final   Streptococcus pyogenes NOT DETECTED NOT DETECTED Final   Acinetobacter baumannii NOT DETECTED NOT DETECTED Final   Enterobacteriaceae species NOT DETECTED NOT DETECTED Final   Enterobacter cloacae complex NOT DETECTED NOT DETECTED Final   Escherichia coli NOT DETECTED NOT DETECTED Final   Klebsiella oxytoca NOT DETECTED NOT  DETECTED Final   Klebsiella pneumoniae NOT DETECTED NOT DETECTED Final   Proteus species NOT DETECTED NOT DETECTED Final   Serratia marcescens NOT DETECTED NOT DETECTED Final   Haemophilus influenzae NOT DETECTED NOT DETECTED Final   Neisseria meningitidis NOT DETECTED NOT DETECTED Final   Pseudomonas aeruginosa NOT DETECTED NOT DETECTED Final   Candida albicans NOT DETECTED NOT DETECTED Final   Candida glabrata NOT DETECTED NOT DETECTED Final   Candida krusei NOT DETECTED NOT DETECTED Final   Candida parapsilosis NOT DETECTED NOT DETECTED Final   Candida tropicalis NOT DETECTED NOT DETECTED Final    Comment: Performed at [facility], [address], [place], [place] [address]  MRSA PCR Screening     Status: Abnormal   Collection Time: 06/18/16  1:40 AM  Result Value Ref Range Status   MRSA by PCR POSITIVE (A) NEGATIVE Final    Comment:        The GeneXpert MRSA Assay (FDA approved for NASAL specimens only), is one component of a comprehensive MRSA colonization surveillance program. It is not intended to diagnose MRSA infection nor to  guide or monitor treatment for MRSA infections. RESULT CALLED TO, READ BACK BY AND VERIFIED WITH: C.SMITH,RN [id number] 06/18/16 W.SHEA   Culture, respiratory (NON-Expectorated)     Status: None   Collection Time: 06/18/16  6:32 AM  Result Value Ref Range Status   Specimen Description TRACHEAL ASPIRATE  Final   Special Requests NONE  Final   Gram Stain   Final    ABUNDANT WBC PRESENT, PREDOMINANTLY PMN RARE SQUAMOUS EPITHELIAL CELLS PRESENT MODERATE GRAM POSITIVE RODS GRAM POSITIVE COCCI IN PAIRS    Culture   Final    Consistent with normal respiratory flora. Performed at [facility], [place] [address], [place], [place] [address]    Report Status 06/20/2016 FINAL  Final  Culture, blood (Routine X 2) w Reflex to ID Panel     Status: None (Preliminary result)   Collection Time: 06/18/16  6:16 PM  Result Value Ref Range Status   Specimen  Description BLOOD RIGHT ARM  Final   Special Requests IN PEDIATRIC BOTTLE Blood Culture adequate volume  Final   Culture   Final    NO GROWTH 4 DAYS Performed at [facility], [place] [address], [place], [place] [address]    Report Status PENDING  Incomplete  Culture, blood (Routine X 2) w Reflex to ID Panel     Status: Abnormal   Collection Time: 06/18/16  6:22 PM  Result Value Ref Range Status   Specimen Description BLOOD RIGHT ANTECUBITAL  Final   Special Requests IN PEDIATRIC BOTTLE Blood Culture adequate volume  Final   Culture  Setup Time   Final    GRAM POSITIVE COCCI IN CLUSTERS IN PEDIATRIC BOTTLE CRITICAL RESULT CALLED TO, READ BACK BY AND VERIFIED WITH: D ZEIGLER PHARMD 1932 06/19/16 A BROWNING    Culture (A)  Final    STAPHYLOCOCCUS AUREUS SUSCEPTIBILITIES PERFORMED ON PREVIOUS CULTURE WITHIN THE LAST 5 DAYS. Performed at [facility], [place] [address], [place], [place] [address]    Report Status 06/21/2016 FINAL  Final  Blood Culture ID Panel (Reflexed)     Status: Abnormal   Collection Time: 06/18/16  6:22 PM  Result Value Ref Range Status   Enterococcus species NOT DETECTED NOT DETECTED Final   Listeria monocytogenes NOT DETECTED NOT DETECTED Final   Staphylococcus species DETECTED (A) NOT DETECTED Final    Comment: CRITICAL RESULT CALLED TO, READ BACK BY AND VERIFIED WITHMarcy Siren PHARMD 1932 06/19/16 A BROWNING    Staphylococcus aureus DETECTED (A) NOT DETECTED Final    Comment: Methicillin (oxacillin) susceptible Staphylococcus aureus (MSSA). Preferred therapy is anti staphylococcal beta lactam antibiotic (Cefazolin or Nafcillin), unless clinically contraindicated. CRITICAL RESULT CALLED TO, READ BACK BY AND VERIFIED WITHMarcy Siren PHARMD 1932 06/19/16 A BROWNING    Methicillin resistance NOT DETECTED NOT DETECTED Final   Streptococcus species NOT DETECTED NOT DETECTED Final   Streptococcus agalactiae NOT DETECTED NOT DETECTED Final   Streptococcus  pneumoniae NOT DETECTED NOT DETECTED Final   Streptococcus pyogenes NOT DETECTED NOT DETECTED Final   Acinetobacter baumannii NOT DETECTED NOT DETECTED Final   Enterobacteriaceae species NOT DETECTED NOT DETECTED Final   Enterobacter cloacae complex NOT DETECTED NOT DETECTED Final   Escherichia coli NOT DETECTED NOT DETECTED Final   Klebsiella oxytoca NOT DETECTED NOT DETECTED Final   Klebsiella pneumoniae NOT DETECTED NOT DETECTED Final   Proteus species NOT DETECTED NOT DETECTED Final   Serratia marcescens NOT DETECTED NOT DETECTED Final   Haemophilus influenzae NOT  DETECTED NOT DETECTED Final   Neisseria meningitidis NOT DETECTED NOT DETECTED Final   Pseudomonas aeruginosa NOT DETECTED NOT DETECTED Final   Candida albicans NOT DETECTED NOT DETECTED Final   Candida glabrata NOT DETECTED NOT DETECTED Final   Candida krusei NOT DETECTED NOT DETECTED Final   Candida parapsilosis NOT DETECTED NOT DETECTED Final   Candida tropicalis NOT DETECTED NOT DETECTED Final    Comment: Performed at [facility], [place] [address], [place], [place] [address]  Culture, blood (Routine X 2) w Reflex to ID Panel     Status: None (Preliminary result)   Collection Time: 06/19/16 11:08 AM  Result Value Ref Range Status   Specimen Description BLOOD RIGHT ARM  Final   Special Requests IN PEDIATRIC BOTTLE Blood Culture adequate volume  Final   Culture   Final    NO GROWTH 3 DAYS Performed at [facility], [place] [address], [place], [place] [address]    Report Status PENDING  Incomplete  Culture, blood (Routine X 2) w Reflex to ID Panel     Status: None (Preliminary result)   Collection Time: 06/19/16 11:08 AM  Result Value Ref Range Status   Specimen Description BLOOD RIGHT HAND  Final   Special Requests IN PEDIATRIC BOTTLE Blood Culture adequate volume  Final   Culture   Final    NO GROWTH 3 DAYS Performed at [facility], [place] [address] [place], [place] [address]    Report Status  PENDING  Incomplete  Body fluid culture     Status: None   Collection Time: 06/19/16 12:25 PM  Result Value Ref Range Status   Specimen Description SYNOVIAL RIGHT KNEE  Final   Special Requests NONE  Final   Gram Stain   Final    MODERATE WBC PRESENT,BOTH PMN AND MONONUCLEAR FEW GRAM POSITIVE COCCI IN PAIRS    Culture   Final    MODERATE STAPHYLOCOCCUS AUREUS NO ANAEROBES ISOLATED Performed at [facility], [place] [address] [place], [place] [address]    Report Status 06/22/2016 FINAL  Final   Organism ID, Bacteria STAPHYLOCOCCUS AUREUS  Final      Susceptibility   Staphylococcus aureus - MIC*    CIPROFLOXACIN <=0.5 SENSITIVE Sensitive     ERYTHROMYCIN <=0.25 SENSITIVE Sensitive     GENTAMICIN <=0.5 SENSITIVE Sensitive     OXACILLIN <=0.25 SENSITIVE Sensitive     TETRACYCLINE <=1 SENSITIVE Sensitive     VANCOMYCIN <=0.5 SENSITIVE Sensitive     TRIMETH/SULFA <=10 SENSITIVE Sensitive     CLINDAMYCIN <=0.25 SENSITIVE Sensitive     RIFAMPIN <=0.5 SENSITIVE Sensitive     Inducible Clindamycin NEGATIVE Sensitive     * MODERATE STAPHYLOCOCCUS AUREUS      Radiology Studies: No results found.   Scheduled Meds: . chlorhexidine  15 mL Mouth Rinse BID  . enoxaparin (LOVENOX) injection  30 mg Subcutaneous Q24H  . feeding supplement  1 Container Oral BID BM  . fluconazole  100 mg Oral Daily  . insulin aspart  0-20 Units Subcutaneous TID WC  . insulin aspart  0-5 Units Subcutaneous QHS  . insulin detemir  5 Units Subcutaneous BID  . liver oil-zinc oxide   Topical BID  . mouth rinse  15 mL Mouth Rinse q12n4p  . nystatin   Topical TID  . potassium chloride  40 mEq Oral BID   Continuous Infusions: . sodium chloride 10 mL/hr at 06/23/16 0300  . ampicillin-sulbactam (UNASYN) IV Stopped (06/23/16  1006)     LOS: 6 days   Time Spent in minutes   30 minutes  Nashua Homewood D.O. on 06/23/2016 at 10:44 AM  Between 7am to 7pm - Pager - [phone]  After 7pm go to  [email or web address] - password [email or web address]  And look for the night coverage person covering for me after hours  [facility] Office  [phone]

## 2016-06-23 NOTE — Progress Notes (Signed)
Peripherally Inserted Central Catheter/Midline Placement  The IV Nurse has discussed with the patient and/or persons authorized to consent for the patient, the purpose of this procedure and the potential benefits and risks involved with this procedure.  The benefits include less needle sticks, lab draws from the catheter, and the patient may be discharged home with the catheter. Risks include, but not limited to, infection, bleeding, blood clot (thrombus formation), and puncture of an artery; nerve damage and irregular heartbeat and possibility to perform a PICC exchange if needed/ordered by physician.  Alternatives to this procedure were also discussed.  Bard Power PICC patient education guide, fact sheet on infection prevention and patient information card has been provided to patient /or left at bedside.    PICC/Midline Placement Documentation  PICC Single Lumen 06/23/16 PICC Right Cephalic 46 cm 0 cm (Active)  Indication for Insertion or Continuance of Line Home intravenous therapies (PICC only) 06/23/2016  3:31 PM  Exposed Catheter (cm) 0 cm 06/23/2016  3:31 PM  Site Assessment Clean;Dry;Intact 06/23/2016  3:31 PM  Line Status Flushed;Saline locked;Blood return noted 06/23/2016  3:31 PM  Dressing Type Transparent;Securing device 06/23/2016  3:31 PM  Dressing Status Clean;Dry;Intact;Antimicrobial disc in place 06/23/2016  3:31 PM  Dressing Change Due 06/30/16 06/23/2016  3:31 PM       Randy [name] 06/23/2016, 3:34 PM

## 2016-06-23 NOTE — Progress Notes (Signed)
[facility] for Infectious Disease   Reason for visit: Follow up on bacteremia  Interval History: no plans for AKA at this time as felt to be high risk; Not able to do TEE; no fever, WBC 11.2.  Blood cultures from 5/4 ngtd. He does not have any pain, does not have any complaints.     Physical Exam: Constitutional:  Vitals:   06/23/16 1100 06/23/16 1200  BP:  (!) 129/46  Pulse: 71 77  Resp: (!) 27 (!) 27  Temp:     patient appears in NAD Eyes: anicteric HENT: no thrush Respiratory: Normal respiratory effort; CTA B Cardiovascular: RRR GI: soft, nt, nd MS: right leg wrapped  Review of Systems: Constitutional: negative for fevers, chills and anorexia Gastrointestinal: negative for diarrhea and constipation Integument/breast: negative for rash  Lab Results  Component Value Date   WBC 11.2 (H) 06/23/2016   HGB 11.1 (L) 06/23/2016   HCT 33.9 (L) 06/23/2016   MCV 84.3 06/23/2016   PLT 350 06/23/2016    Lab Results  Component Value Date   CREATININE 1.89 (H) 06/23/2016   BUN 16 06/23/2016   NA 139 06/23/2016   K 3.0 (L) 06/23/2016   CL 112 (H) 06/23/2016   CO2 21 (L) 06/23/2016    Lab Results  Component Value Date   ALT 8 (L) 06/17/2016   AST 21 06/17/2016   ALKPHOS 70 06/17/2016     Microbiology: Recent Results (from the past 240 hour(s))  Blood Culture (routine x 2)     Status: Abnormal   Collection Time: 06/17/16  7:52 PM  Result Value Ref Range Status   Specimen Description BLOOD BLOOD LEFT FOREARM  Final   Special Requests IN PEDIATRIC BOTTLE Blood Culture adequate volume  Final   Culture  Setup Time   Final    GRAM POSITIVE COCCI IN CLUSTERS IN PEDIATRIC BOTTLE CRITICAL VALUE NOTED.  VALUE IS CONSISTENT WITH PREVIOUSLY REPORTED AND CALLED VALUE.    Culture (A)  Final    STAPHYLOCOCCUS SPECIES (COAGULASE NEGATIVE) THE SIGNIFICANCE OF ISOLATING THIS ORGANISM FROM A SINGLE SET OF BLOOD CULTURES WHEN MULTIPLE SETS ARE DRAWN IS UNCERTAIN. PLEASE  NOTIFY THE MICROBIOLOGY DEPARTMENT WITHIN ONE WEEK IF SPECIATION AND SENSITIVITIES ARE REQUIRED. Performed at [facility], [place] [address], [place], [place] [address]    Report Status 06/19/2016 FINAL  Final  Blood Culture (routine x 2)     Status: Abnormal   Collection Time: 06/17/16  8:12 PM  Result Value Ref Range Status   Specimen Description BLOOD RIGHT ANTECUBITAL  Final   Special Requests   Final    BOTTLES DRAWN AEROBIC AND ANAEROBIC Blood Culture results may not be optimal due to an inadequate volume of blood received in culture bottles   Culture  Setup Time   Final    GRAM POSITIVE COCCI IN CHAINS IN BOTH AEROBIC AND ANAEROBIC BOTTLES CRITICAL RESULT CALLED TO, READ BACK BY AND VERIFIED WITH: N. Glogovac Pharm.D. 15:45 06/18/16 ([email or web address]) Performed at [facility], [place] [address], [place], [place] [address]    Culture STAPHYLOCOCCUS AUREUS ENTEROCOCCUS FAECALIS  (A)  Final   Report Status 06/21/2016 FINAL  Final   Organism ID, Bacteria STAPHYLOCOCCUS AUREUS  Final   Organism ID, Bacteria ENTEROCOCCUS FAECALIS  Final      Susceptibility   Enterococcus faecalis - MIC*    AMPICILLIN <=2 SENSITIVE Sensitive     VANCOMYCIN <=0.5 SENSITIVE Sensitive     GENTAMICIN SYNERGY SENSITIVE  Sensitive     * ENTEROCOCCUS FAECALIS   Staphylococcus aureus - MIC*    CIPROFLOXACIN <=0.5 SENSITIVE Sensitive     ERYTHROMYCIN <=0.25 SENSITIVE Sensitive     GENTAMICIN <=0.5 SENSITIVE Sensitive     OXACILLIN <=0.25 SENSITIVE Sensitive     TETRACYCLINE <=1 SENSITIVE Sensitive     VANCOMYCIN <=0.5 SENSITIVE Sensitive     TRIMETH/SULFA <=10 SENSITIVE Sensitive     CLINDAMYCIN <=0.25 SENSITIVE Sensitive     RIFAMPIN <=0.5 SENSITIVE Sensitive     Inducible Clindamycin NEGATIVE Sensitive     * STAPHYLOCOCCUS AUREUS  Blood Culture ID Panel (Reflexed)     Status: Abnormal   Collection Time: 06/17/16  8:12 PM  Result Value Ref Range Status   Enterococcus species DETECTED (A) NOT DETECTED  Final    Comment: CRITICAL RESULT CALLED TO, READ BACK BY AND VERIFIED WITH: N. Glogovac Pharm.D. 15:45 06/18/16 (wilsonm)    Vancomycin resistance NOT DETECTED NOT DETECTED Final   Listeria monocytogenes NOT DETECTED NOT DETECTED Final   Staphylococcus species DETECTED (A) NOT DETECTED Final    Comment: CRITICAL RESULT CALLED TO, READ BACK BY AND VERIFIED WITH: N. Glogovac Pharm.D. 15:45 06/18/16 (wilsonm)    Staphylococcus aureus DETECTED (A) NOT DETECTED Final    Comment: Methicillin (oxacillin) susceptible Staphylococcus aureus (MSSA). Preferred therapy is anti staphylococcal beta lactam antibiotic (Cefazolin or Nafcillin), unless clinically contraindicated. CRITICAL RESULT CALLED TO, READ BACK BY AND VERIFIED WITH: N. Glogovac Pharm.D. 15:45 06/18/16 (wilsonm)    Methicillin resistance NOT DETECTED NOT DETECTED Final   Streptococcus species NOT DETECTED NOT DETECTED Final   Streptococcus agalactiae NOT DETECTED NOT DETECTED Final   Streptococcus pneumoniae NOT DETECTED NOT DETECTED Final   Streptococcus pyogenes NOT DETECTED NOT DETECTED Final   Acinetobacter baumannii NOT DETECTED NOT DETECTED Final   Enterobacteriaceae species NOT DETECTED NOT DETECTED Final   Enterobacter cloacae complex NOT DETECTED NOT DETECTED Final   Escherichia coli NOT DETECTED NOT DETECTED Final   Klebsiella oxytoca NOT DETECTED NOT DETECTED Final   Klebsiella pneumoniae NOT DETECTED NOT DETECTED Final   Proteus species NOT DETECTED NOT DETECTED Final   Serratia marcescens NOT DETECTED NOT DETECTED Final   Haemophilus influenzae NOT DETECTED NOT DETECTED Final   Neisseria meningitidis NOT DETECTED NOT DETECTED Final   Pseudomonas aeruginosa NOT DETECTED NOT DETECTED Final   Candida albicans NOT DETECTED NOT DETECTED Final   Candida glabrata NOT DETECTED NOT DETECTED Final   Candida krusei NOT DETECTED NOT DETECTED Final   Candida parapsilosis NOT DETECTED NOT DETECTED Final   Candida tropicalis NOT  DETECTED NOT DETECTED Final    Comment: Performed at [facility], [address] [place], [place] [address]  MRSA PCR Screening     Status: Abnormal   Collection Time: 06/18/16  1:40 AM  Result Value Ref Range Status   MRSA by PCR POSITIVE (A) NEGATIVE Final    Comment:        The GeneXpert MRSA Assay (FDA approved for NASAL specimens only), is one component of a comprehensive MRSA colonization surveillance program. It is not intended to diagnose MRSA infection nor to guide or monitor treatment for MRSA infections. RESULT CALLED TO, READ BACK BY AND VERIFIED WITH: C.SMITH,RN [id number] 06/18/16 W.SHEA   Culture, respiratory (NON-Expectorated)     Status: None   Collection Time: 06/18/16  6:32 AM  Result Value Ref Range Status   Specimen Description TRACHEAL ASPIRATE  Final   Special Requests NONE  Final   Gram  Stain   Final    ABUNDANT WBC PRESENT, PREDOMINANTLY PMN RARE SQUAMOUS EPITHELIAL CELLS PRESENT MODERATE GRAM POSITIVE RODS GRAM POSITIVE COCCI IN PAIRS    Culture   Final    Consistent with normal respiratory flora. Performed at [facility], [place] [address], [place], [place] [address]    Report Status 06/20/2016 FINAL  Final  Culture, blood (Routine X 2) w Reflex to ID Panel     Status: None (Preliminary result)   Collection Time: 06/18/16  6:16 PM  Result Value Ref Range Status   Specimen Description BLOOD RIGHT ARM  Final   Special Requests IN PEDIATRIC BOTTLE Blood Culture adequate volume  Final   Culture   Final    NO GROWTH 4 DAYS Performed at [facility], [place] [address], [place], [place] [address]    Report Status PENDING  Incomplete  Culture, blood (Routine X 2) w Reflex to ID Panel     Status: Abnormal   Collection Time: 06/18/16  6:22 PM  Result Value Ref Range Status   Specimen Description BLOOD RIGHT ANTECUBITAL  Final   Special Requests IN PEDIATRIC BOTTLE Blood Culture adequate volume  Final   Culture  Setup Time   Final    GRAM  POSITIVE COCCI IN CLUSTERS IN PEDIATRIC BOTTLE CRITICAL RESULT CALLED TO, READ BACK BY AND VERIFIED WITH: D ZEIGLER PHARMD 1932 06/19/16 A BROWNING    Culture (A)  Final    STAPHYLOCOCCUS AUREUS SUSCEPTIBILITIES PERFORMED ON PREVIOUS CULTURE WITHIN THE LAST 5 DAYS. Performed at [facility], [place] [address] [place], [place] [address]    Report Status 06/21/2016 FINAL  Final  Blood Culture ID Panel (Reflexed)     Status: Abnormal   Collection Time: 06/18/16  6:22 PM  Result Value Ref Range Status   Enterococcus species NOT DETECTED NOT DETECTED Final   Listeria monocytogenes NOT DETECTED NOT DETECTED Final   Staphylococcus species DETECTED (A) NOT DETECTED Final    Comment: CRITICAL RESULT CALLED TO, READ BACK BY AND VERIFIED WITHMarcy Siren PHARMD 1932 06/19/16 A BROWNING    Staphylococcus aureus DETECTED (A) NOT DETECTED Final    Comment: Methicillin (oxacillin) susceptible Staphylococcus aureus (MSSA). Preferred therapy is anti staphylococcal beta lactam antibiotic (Cefazolin or Nafcillin), unless clinically contraindicated. CRITICAL RESULT CALLED TO, READ BACK BY AND VERIFIED WITHMarcy Siren PHARMD 1932 06/19/16 A BROWNING    Methicillin resistance NOT DETECTED NOT DETECTED Final   Streptococcus species NOT DETECTED NOT DETECTED Final   Streptococcus agalactiae NOT DETECTED NOT DETECTED Final   Streptococcus pneumoniae NOT DETECTED NOT DETECTED Final   Streptococcus pyogenes NOT DETECTED NOT DETECTED Final   Acinetobacter baumannii NOT DETECTED NOT DETECTED Final   Enterobacteriaceae species NOT DETECTED NOT DETECTED Final   Enterobacter cloacae complex NOT DETECTED NOT DETECTED Final   Escherichia coli NOT DETECTED NOT DETECTED Final   Klebsiella oxytoca NOT DETECTED NOT DETECTED Final   Klebsiella pneumoniae NOT DETECTED NOT DETECTED Final   Proteus species NOT DETECTED NOT DETECTED Final   Serratia marcescens NOT DETECTED NOT DETECTED Final   Haemophilus influenzae NOT  DETECTED NOT DETECTED Final   Neisseria meningitidis NOT DETECTED NOT DETECTED Final   Pseudomonas aeruginosa NOT DETECTED NOT DETECTED Final   Candida albicans NOT DETECTED NOT DETECTED Final   Candida glabrata NOT DETECTED NOT DETECTED Final   Candida krusei NOT DETECTED NOT DETECTED Final   Candida parapsilosis NOT DETECTED NOT DETECTED Final   Candida tropicalis NOT  DETECTED NOT DETECTED Final    Comment: Performed at [facility], [place] [address], [place], [place] [address]  Culture, blood (Routine X 2) w Reflex to ID Panel     Status: None (Preliminary result)   Collection Time: 06/19/16 11:08 AM  Result Value Ref Range Status   Specimen Description BLOOD RIGHT ARM  Final   Special Requests IN PEDIATRIC BOTTLE Blood Culture adequate volume  Final   Culture   Final    NO GROWTH 3 DAYS Performed at [facility], [place] [address], [place], [place] [address]    Report Status PENDING  Incomplete  Culture, blood (Routine X 2) w Reflex to ID Panel     Status: None (Preliminary result)   Collection Time: 06/19/16 11:08 AM  Result Value Ref Range Status   Specimen Description BLOOD RIGHT HAND  Final   Special Requests IN PEDIATRIC BOTTLE Blood Culture adequate volume  Final   Culture   Final    NO GROWTH 3 DAYS Performed at [facility], [place] [address], [place], [place] [address]    Report Status PENDING  Incomplete  Body fluid culture     Status: None   Collection Time: 06/19/16 12:25 PM  Result Value Ref Range Status   Specimen Description SYNOVIAL RIGHT KNEE  Final   Special Requests NONE  Final   Gram Stain   Final    MODERATE WBC PRESENT,BOTH PMN AND MONONUCLEAR FEW GRAM POSITIVE COCCI IN PAIRS    Culture   Final    MODERATE STAPHYLOCOCCUS AUREUS NO ANAEROBES ISOLATED Performed at [facility], [place] [address], [place], [place] [address]    Report Status 06/22/2016 FINAL  Final   Organism ID, Bacteria STAPHYLOCOCCUS AUREUS  Final      Susceptibility     Staphylococcus aureus - MIC*    CIPROFLOXACIN <=0.5 SENSITIVE Sensitive     ERYTHROMYCIN <=0.25 SENSITIVE Sensitive     GENTAMICIN <=0.5 SENSITIVE Sensitive     OXACILLIN <=0.25 SENSITIVE Sensitive     TETRACYCLINE <=1 SENSITIVE Sensitive     VANCOMYCIN <=0.5 SENSITIVE Sensitive     TRIMETH/SULFA <=10 SENSITIVE Sensitive     CLINDAMYCIN <=0.25 SENSITIVE Sensitive     RIFAMPIN <=0.5 SENSITIVE Sensitive     Inducible Clindamycin NEGATIVE Sensitive     * MODERATE STAPHYLOCOCCUS AUREUS    Impression/Plan:  1. 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  On amp/sulbactam for MSSA and enterococcus.  Will need 6 weeks though June 14th After completing IV antibiotics, can pull picc line On June 15th, will need to start oral suppressive therapy with Augmentin 875 bid indefinitely - amoxicillin is a better option than Keflex due to Enterococcus which is not covered by Keflex  2.  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  He will need to be treated as above followed by suppressive Augmentin.  3.  Bacteremia - repeat blood cultures clear to date.  OK from ID standpoint to place picc line today.    Dr. Tommy Medal back tomorrow

## 2016-06-23 NOTE — Progress Notes (Signed)
  Speech Language Pathology Treatment: Dysphagia  Patient Details Name: Randy [name] MRN: [id number] DOB: 1924-11-23 Today's Date: 06/23/2016 Time: 6283-6629 SLP Time Calculation (min) (ACUTE ONLY): 56 min  Assessment / Plan / Recommendation Clinical Impression  Extensive treatment session completed with pt being thoroughly educated to [facility] findings, compensation strategies to mitigate aspiration risk.  Pt continues to desire solid foods but after demonstration of swallow dysfunction via video and clinically, he stated he just wanted some cream soup.  Advised he could order that on full liquids.   Observed pt with 15 boluses of thin Boost breeze, Boost chocolate flavor, applesauce 4 tsps, 4 tsps chocolate pudding and 3 small graham cracker boluses  Pt continues requiring multiple swallows across all boluses (up to 5) with improved awareness of pharyngeal residuals with pudding/applesauce as pt said "Why won't that go down well?". Pt admits to improvement of pharyngeal clearance with liquids vs solids.     Use of liquid swallows to decrease solids uncertain if clinically helpful as pt with severe sensory deficits on MBS.  However pt did "hock" and expectorate pudding and applesauce x3 during testing  - clearly indicative of vallecular residuals and lack of pharyngeal clearance.  Pt coughed and expectorated approximately 1 ounces of pudding after intake of 3 boluses- delayed - suspect it occurred as residuals spilled into open airway with reflexive cough.  Fortunately pt's cough/hocking ability are strong and this is most helpful for airway protection for this pt.     Do not anticipate MBS will change pt's outcomes and pt agreeable to forgo.   would be helpful as pt is at much higher risk of aspiration with solids due to gross amount of pharyngeal residuals.   Advised pt to increased pulmonary ramifications possible if he aspirates solids.   Discussion with pt/son may be helpful to determine if they  agree with potential diet advancement for nutrition/QOL given pt managed thus far.  SlP to follow up.    HPI HPI: pt admit to [facility] with AMS, cough- found to be bacterial septic.  PMH + for DM, MI, no CVA hx. CT chest and CXR negative.  Swallow eval ordered due to pt having dysphagia.       SLP Plan  Continue with current plan of care       Recommendations  Liquids provided via: Cup Medication Administration: Crushed with puree Supervision: Staff to assist with self feeding;Full supervision/cueing for compensatory strategies Compensations: Slow rate;Small sips/bites;Follow solids with liquid;Multiple dry swallows after each bite/sip (Cough and expectorate after liquid boluses) Postural Changes and/or Swallow Maneuvers: Seated upright 90 degrees;Upright 30-60 min after meal                Oral Care Recommendations: Oral care BID Follow up Recommendations: Other (comment) (TBD) SLP Visit Diagnosis: Dysphagia, oropharyngeal phase (R13.12) Plan: Continue with current plan of care       [place], [facility] SLP [phone]

## 2016-06-23 NOTE — Progress Notes (Signed)
Venous exam will be done first case tomorrow 5/8 around 8am

## 2016-06-24 ENCOUNTER — Inpatient Hospital Stay (HOSPITAL_COMMUNITY): Payer: Medicare Other

## 2016-06-24 DIAGNOSIS — N183 Chronic kidney disease, stage 3 unspecified: Secondary | ICD-10-CM

## 2016-06-24 DIAGNOSIS — R609 Edema, unspecified: Secondary | ICD-10-CM

## 2016-06-24 DIAGNOSIS — I82409 Acute embolism and thrombosis of unspecified deep veins of unspecified lower extremity: Secondary | ICD-10-CM

## 2016-06-24 DIAGNOSIS — I82403 Acute embolism and thrombosis of unspecified deep veins of lower extremity, bilateral: Secondary | ICD-10-CM

## 2016-06-24 DIAGNOSIS — N179 Acute kidney failure, unspecified: Secondary | ICD-10-CM

## 2016-06-24 LAB — GLUCOSE, CAPILLARY
GLUCOSE-CAPILLARY: 151 mg/dL — AB (ref 65–99)
GLUCOSE-CAPILLARY: 151 mg/dL — AB (ref 65–99)
Glucose-Capillary: 271 mg/dL — ABNORMAL HIGH (ref 65–99)
Glucose-Capillary: 117 mg/dL — ABNORMAL HIGH (ref 65–99)

## 2016-06-24 LAB — HEPARIN LEVEL (UNFRACTIONATED): Heparin Unfractionated: 0.35 IU/mL (ref 0.30–0.70)

## 2016-06-24 LAB — CBC
HCT: 30.5 % — ABNORMAL LOW (ref 39.0–52.0)
Hemoglobin: 10.1 g/dL — ABNORMAL LOW (ref 13.0–17.0)
MCH: 27.7 pg (ref 26.0–34.0)
MCHC: 33.1 g/dL (ref 30.0–36.0)
MCV: 83.8 fL (ref 78.0–100.0)
PLATELETS: 404 10*3/uL — AB (ref 150–400)
RBC: 3.64 MIL/uL — ABNORMAL LOW (ref 4.22–5.81)
RDW: 16.5 % — ABNORMAL HIGH (ref 11.5–15.5)
WBC: 12.2 10*3/uL — ABNORMAL HIGH (ref 4.0–10.5)

## 2016-06-24 LAB — BASIC METABOLIC PANEL
Anion gap: 7 (ref 5–15)
BUN: 17 mg/dL (ref 6–20)
CO2: 21 mmol/L — ABNORMAL LOW (ref 22–32)
CREATININE: 1.69 mg/dL — AB (ref 0.61–1.24)
Calcium: 7.8 mg/dL — ABNORMAL LOW (ref 8.9–10.3)
Chloride: 110 mmol/L (ref 101–111)
GFR calc Af Amer: 39 mL/min — ABNORMAL LOW (ref >60)
GFR, EST NON AFRICAN AMERICAN: 34 mL/min — AB (ref >60)
Glucose, Bld: 185 mg/dL — ABNORMAL HIGH (ref 65–99)
Potassium: 3.6 mmol/L (ref 3.5–5.1)
SODIUM: 138 mmol/L (ref 135–145)

## 2016-06-24 LAB — CULTURE, BLOOD (ROUTINE X 2)
CULTURE: NO GROWTH
Culture: NO GROWTH
SPECIAL REQUESTS: ADEQUATE
Special Requests: ADEQUATE

## 2016-06-24 MED ORDER — HEPARIN (PORCINE) IN NACL 100-0.45 UNIT/ML-% IJ SOLN
1450.0000 [IU]/h | INTRAMUSCULAR | Status: DC
  Administered 2016-06-24: 1400 [IU]/h via INTRAVENOUS
  Administered 2016-06-25: 1450 [IU]/h via INTRAVENOUS
  Filled 2016-06-24 (×2): qty 250

## 2016-06-24 MED ORDER — INSULIN DETEMIR 100 UNIT/ML ~~LOC~~ SOLN
6.0000 [IU] | Freq: Two times a day (BID) | SUBCUTANEOUS | Status: DC
  Administered 2016-06-24 – 2016-06-26 (×4): 6 [IU] via SUBCUTANEOUS
  Filled 2016-06-24 (×6): qty 0.06

## 2016-06-24 MED ORDER — SODIUM CHLORIDE 0.9 % IV SOLN: INTRAVENOUS | Status: DC

## 2016-06-24 MED ORDER — INSULIN ASPART 100 UNIT/ML ~~LOC~~ SOLN
4.0000 [IU] | Freq: Three times a day (TID) | SUBCUTANEOUS | Status: DC
  Administered 2016-06-24 – 2016-06-26 (×6): 4 [IU] via SUBCUTANEOUS

## 2016-06-24 NOTE — Progress Notes (Signed)
Subjective:  No new complaints    Antibiotics:  Anti-infectives    Start     Dose/Rate Route Frequency Ordered Stop   06/22/16 1000  Ampicillin-Sulbactam (UNASYN) 3 g in sodium chloride 0.9 % 100 mL IVPB     3 g 200 mL/hr over 30 Minutes Intravenous Every 6 hours 06/22/16 0839     06/19/16 2100  nafcillin 2 g in dextrose 5 % 100 mL IVPB  Status:  Discontinued     2 g 200 mL/hr over 30 Minutes Intravenous Every 4 hours 06/18/16 2032 06/22/16 0837   06/18/16 2200  vancomycin (VANCOCIN) IVPB 1000 mg/200 mL premix  Status:  Discontinued     1,000 mg 200 mL/hr over 60 Minutes Intravenous Every 24 hours 06/18/16 1702 06/22/16 0837   06/18/16 2100  vancomycin (VANCOCIN) IVPB 750 mg/150 ml premix  Status:  Discontinued     750 mg 150 mL/hr over 60 Minutes Intravenous Every 24 hours 06/17/16 2103 06/17/16 2103   06/18/16 2100  vancomycin (VANCOCIN) IVPB 1000 mg/200 mL premix  Status:  Discontinued     1,000 mg 200 mL/hr over 60 Minutes Intravenous Every 24 hours 06/17/16 2103 06/18/16 1638   06/18/16 2100  nafcillin IVPB 2 g  Status:  Discontinued     2 g 200 mL/hr over 30 Minutes Intravenous Every 4 hours 06/18/16 2025 06/18/16 2026   06/18/16 2100  nafcillin 2 g in dextrose 5 % 50 mL IVPB     2 g 100 mL/hr over 30 Minutes Intravenous Every 4 hours 06/18/16 2027 06/19/16 1741   06/18/16 1700  nafcillin injection 2 g  Status:  Discontinued     2 g Intravenous Every 4 hours 06/18/16 1641 06/18/16 1650   06/18/16 1700  nafcillin 2 g in dextrose 5 % 100 mL IVPB  Status:  Discontinued     2 g 200 mL/hr over 30 Minutes Intravenous Every 4 hours 06/18/16 1650 06/18/16 2032   06/18/16 1600  fluconazole (DIFLUCAN) tablet 100 mg     100 mg Oral Daily 06/18/16 1516     06/18/16 0400  piperacillin-tazobactam (ZOSYN) IVPB 3.375 g  Status:  Discontinued     3.375 g 12.5 mL/hr over 240 Minutes Intravenous Every 8 hours 06/17/16 2103 06/18/16 1556   06/17/16 2045   piperacillin-tazobactam (ZOSYN) IVPB 3.375 g     3.375 g 100 mL/hr over 30 Minutes Intravenous  Once 06/17/16 2032 06/17/16 2152   06/17/16 2045  vancomycin (VANCOCIN) IVPB 1000 mg/200 mL premix     1,000 mg 200 mL/hr over 60 Minutes Intravenous  Once 06/17/16 2032 06/17/16 2334      Medications: Scheduled Meds: . chlorhexidine  15 mL Mouth Rinse BID  . Chlorhexidine Gluconate Cloth  6 each Topical Daily  . feeding supplement  1 Container Oral BID BM  . fluconazole  100 mg Oral Daily  . insulin aspart  0-20 Units Subcutaneous TID WC  . insulin aspart  0-5 Units Subcutaneous QHS  . insulin aspart  4 Units Subcutaneous TID WC  . insulin detemir  6 Units Subcutaneous BID  . liver oil-zinc oxide   Topical BID  . mouth rinse  15 mL Mouth Rinse q12n4p  . nystatin   Topical TID  . sodium chloride flush  10-40 mL Intracatheter Q12H   Continuous Infusions: . sodium chloride 10 mL/hr at 06/24/16 0600  . sodium chloride    . ampicillin-sulbactam (UNASYN) IV Stopped (06/24/16 1151)  .  heparin 1,400 Units/hr (06/24/16 1348)   PRN Meds:.hydrALAZINE, sodium chloride flush    Objective: Weight change:   Intake/Output Summary (Last 24 hours) at 06/24/16 1755 Last data filed at 06/24/16 1121  Gross per 24 hour  Intake             1270 ml  Output              400 ml  Net              870 ml   Blood pressure 131/72, pulse 79, temperature 98.8 F (37.1 C), temperature source Oral, resp. rate 17, height 5\' 11"  (1.803 m), weight 212 lb 4.9 oz (96.3 kg), SpO2 100 %. Temp:  [98.1 F (36.7 C)-99.5 F (37.5 C)] 98.8 F (37.1 C) (05/09 1551) Pulse Rate:  [32-79] 79 (05/09 1551) Resp:  [11-28] 17 (05/09 1551) BP: (117-131)/(54-78) 131/72 (05/09 1551) SpO2:  [88 %-100 %] 100 % (05/09 1551)  Physical Exam: General: Alert and awake oriented to person, thinks he is in [place]: anicteric sclera, pupils reactive to light and accommodation, EOMI CVS regular rate, normal r,  no  murmur rubs or gallops Chest: clear to auscultation bilaterally, no wheezing, rales or rhonchi Abdomen: soft nontender, edematous skin Extremities Right knee tender Skin: intertrigo  Neuro: nonfocal  CBC:  CBC Latest Ref Rng & Units 06/24/2016 06/23/2016 06/22/2016  WBC 4.0 - 10.5 K/uL 12.2(H) 11.2(H) 10.6(H)  Hemoglobin 13.0 - 17.0 g/dL 10.1(L) 11.1(L) 11.3(L)  Hematocrit 39.0 - 52.0 % 30.5(L) 33.9(L) 34.8(L)  Platelets 150 - 400 K/uL 404(H) 350 305      BMET  Recent Labs  06/23/16 0331 06/24/16 0348  NA 139 138  K 3.0* 3.6  CL 112* 110  CO2 21* 21*  GLUCOSE 119* 185*  BUN 16 17  CREATININE 1.89* 1.69*  CALCIUM 7.5* 7.8*     Liver Panel  No results for input(s): PROT, ALBUMIN, AST, ALT, ALKPHOS, BILITOT, BILIDIR, IBILI in the last 72 hours.     Sedimentation Rate No results for input(s): ESRSEDRATE in the last 72 hours. C-Reactive Protein No results for input(s): CRP in the last 72 hours.  Micro Results: Recent Results (from the past 720 hour(s))  Blood Culture (routine x 2)     Status: Abnormal   Collection Time: 06/17/16  7:52 PM  Result Value Ref Range Status   Specimen Description BLOOD BLOOD LEFT FOREARM  Final   Special Requests IN PEDIATRIC BOTTLE Blood Culture adequate volume  Final   Culture  Setup Time   Final    GRAM POSITIVE COCCI IN CLUSTERS IN PEDIATRIC BOTTLE CRITICAL VALUE NOTED.  VALUE IS CONSISTENT WITH PREVIOUSLY REPORTED AND CALLED VALUE.    Culture (A)  Final    STAPHYLOCOCCUS SPECIES (COAGULASE NEGATIVE) THE SIGNIFICANCE OF ISOLATING THIS ORGANISM FROM A SINGLE SET OF BLOOD CULTURES WHEN MULTIPLE SETS ARE DRAWN IS UNCERTAIN. PLEASE NOTIFY THE MICROBIOLOGY DEPARTMENT WITHIN ONE WEEK IF SPECIATION AND SENSITIVITIES ARE REQUIRED. Performed at [facility], [place] [address] [place], [place] [address]    Report Status 06/19/2016 FINAL  Final  Blood Culture (routine x 2)     Status: Abnormal   Collection Time: 06/17/16  8:12 PM    Result Value Ref Range Status   Specimen Description BLOOD RIGHT ANTECUBITAL  Final   Special Requests   Final    BOTTLES DRAWN AEROBIC AND ANAEROBIC Blood Culture results may not be optimal due to an inadequate volume of blood  received in culture bottles   Culture  Setup Time   Final    GRAM POSITIVE COCCI IN CHAINS IN BOTH AEROBIC AND ANAEROBIC BOTTLES CRITICAL RESULT CALLED TO, READ BACK BY AND VERIFIED WITH: N. Glogovac Pharm.D. 15:45 06/18/16 ([email or web address]) Performed at [facility], [place] [address] [place], [place] [address]    Culture STAPHYLOCOCCUS AUREUS ENTEROCOCCUS FAECALIS  (A)  Final   Report Status 06/21/2016 FINAL  Final   Organism ID, Bacteria STAPHYLOCOCCUS AUREUS  Final   Organism ID, Bacteria ENTEROCOCCUS FAECALIS  Final      Susceptibility   Enterococcus faecalis - MIC*    AMPICILLIN <=2 SENSITIVE Sensitive     VANCOMYCIN <=0.5 SENSITIVE Sensitive     GENTAMICIN SYNERGY SENSITIVE Sensitive     * ENTEROCOCCUS FAECALIS   Staphylococcus aureus - MIC*    CIPROFLOXACIN <=0.5 SENSITIVE Sensitive     ERYTHROMYCIN <=0.25 SENSITIVE Sensitive     GENTAMICIN <=0.5 SENSITIVE Sensitive     OXACILLIN <=0.25 SENSITIVE Sensitive     TETRACYCLINE <=1 SENSITIVE Sensitive     VANCOMYCIN <=0.5 SENSITIVE Sensitive     TRIMETH/SULFA <=10 SENSITIVE Sensitive     CLINDAMYCIN <=0.25 SENSITIVE Sensitive     RIFAMPIN <=0.5 SENSITIVE Sensitive     Inducible Clindamycin NEGATIVE Sensitive     * STAPHYLOCOCCUS AUREUS  Blood Culture ID Panel (Reflexed)     Status: Abnormal   Collection Time: 06/17/16  8:12 PM  Result Value Ref Range Status   Enterococcus species DETECTED (A) NOT DETECTED Final    Comment: CRITICAL RESULT CALLED TO, READ BACK BY AND VERIFIED WITH: N. Glogovac Pharm.D. 15:45 06/18/16 ([email or web address])    Vancomycin resistance NOT DETECTED NOT DETECTED Final   Listeria monocytogenes NOT DETECTED NOT DETECTED Final   Staphylococcus species DETECTED (A) NOT DETECTED Final     Comment: CRITICAL RESULT CALLED TO, READ BACK BY AND VERIFIED WITH: N. Glogovac Pharm.D. 15:45 06/18/16 (wilsonm)    Staphylococcus aureus DETECTED (A) NOT DETECTED Final    Comment: Methicillin (oxacillin) susceptible Staphylococcus aureus (MSSA). Preferred therapy is anti staphylococcal beta lactam antibiotic (Cefazolin or Nafcillin), unless clinically contraindicated. CRITICAL RESULT CALLED TO, READ BACK BY AND VERIFIED WITH: N. Glogovac Pharm.D. 15:45 06/18/16 (wilsonm)    Methicillin resistance NOT DETECTED NOT DETECTED Final   Streptococcus species NOT DETECTED NOT DETECTED Final   Streptococcus agalactiae NOT DETECTED NOT DETECTED Final   Streptococcus pneumoniae NOT DETECTED NOT DETECTED Final   Streptococcus pyogenes NOT DETECTED NOT DETECTED Final   Acinetobacter baumannii NOT DETECTED NOT DETECTED Final   Enterobacteriaceae species NOT DETECTED NOT DETECTED Final   Enterobacter cloacae complex NOT DETECTED NOT DETECTED Final   Escherichia coli NOT DETECTED NOT DETECTED Final   Klebsiella oxytoca NOT DETECTED NOT DETECTED Final   Klebsiella pneumoniae NOT DETECTED NOT DETECTED Final   Proteus species NOT DETECTED NOT DETECTED Final   Serratia marcescens NOT DETECTED NOT DETECTED Final   Haemophilus influenzae NOT DETECTED NOT DETECTED Final   Neisseria meningitidis NOT DETECTED NOT DETECTED Final   Pseudomonas aeruginosa NOT DETECTED NOT DETECTED Final   Candida albicans NOT DETECTED NOT DETECTED Final   Candida glabrata NOT DETECTED NOT DETECTED Final   Candida krusei NOT DETECTED NOT DETECTED Final   Candida parapsilosis NOT DETECTED NOT DETECTED Final   Candida tropicalis NOT DETECTED NOT DETECTED Final    Comment: Performed at [facility], [address], [place], [place] [address]  MRSA PCR Screening     Status: Abnormal  Collection Time: 06/18/16  1:40 AM  Result Value Ref Range Status   MRSA by PCR POSITIVE (A) NEGATIVE Final    Comment:        The GeneXpert  MRSA Assay (FDA approved for NASAL specimens only), is one component of a comprehensive MRSA colonization surveillance program. It is not intended to diagnose MRSA infection nor to guide or monitor treatment for MRSA infections. RESULT CALLED TO, READ BACK BY AND VERIFIED WITH: C.SMITH,RN [id number] 06/18/16 W.SHEA   Culture, respiratory (NON-Expectorated)     Status: None   Collection Time: 06/18/16  6:32 AM  Result Value Ref Range Status   Specimen Description TRACHEAL ASPIRATE  Final   Special Requests NONE  Final   Gram Stain   Final    ABUNDANT WBC PRESENT, PREDOMINANTLY PMN RARE SQUAMOUS EPITHELIAL CELLS PRESENT MODERATE GRAM POSITIVE RODS GRAM POSITIVE COCCI IN PAIRS    Culture   Final    Consistent with normal respiratory flora. Performed at [facility], [place] [address], [place], [place] [address]    Report Status 06/20/2016 FINAL  Final  Culture, blood (Routine X 2) w Reflex to ID Panel     Status: None   Collection Time: 06/18/16  6:16 PM  Result Value Ref Range Status   Specimen Description BLOOD RIGHT ARM  Final   Special Requests IN PEDIATRIC BOTTLE Blood Culture adequate volume  Final   Culture   Final    NO GROWTH 5 DAYS Performed at [facility], [place] [address] [place], [place] [address]    Report Status 06/23/2016 FINAL  Final  Culture, blood (Routine X 2) w Reflex to ID Panel     Status: Abnormal   Collection Time: 06/18/16  6:22 PM  Result Value Ref Range Status   Specimen Description BLOOD RIGHT ANTECUBITAL  Final   Special Requests IN PEDIATRIC BOTTLE Blood Culture adequate volume  Final   Culture  Setup Time   Final    GRAM POSITIVE COCCI IN CLUSTERS IN PEDIATRIC BOTTLE CRITICAL RESULT CALLED TO, READ BACK BY AND VERIFIED WITH: D ZEIGLER PHARMD 1932 06/19/16 A BROWNING    Culture (A)  Final    STAPHYLOCOCCUS AUREUS SUSCEPTIBILITIES PERFORMED ON PREVIOUS CULTURE WITHIN THE LAST 5 DAYS. Performed at [facility], [place] [address],  [place], [place] [address]    Report Status 06/21/2016 FINAL  Final  Blood Culture ID Panel (Reflexed)     Status: Abnormal   Collection Time: 06/18/16  6:22 PM  Result Value Ref Range Status   Enterococcus species NOT DETECTED NOT DETECTED Final   Listeria monocytogenes NOT DETECTED NOT DETECTED Final   Staphylococcus species DETECTED (A) NOT DETECTED Final    Comment: CRITICAL RESULT CALLED TO, READ BACK BY AND VERIFIED WITHMarcy Siren PHARMD 1932 06/19/16 A BROWNING    Staphylococcus aureus DETECTED (A) NOT DETECTED Final    Comment: Methicillin (oxacillin) susceptible Staphylococcus aureus (MSSA). Preferred therapy is anti staphylococcal beta lactam antibiotic (Cefazolin or Nafcillin), unless clinically contraindicated. CRITICAL RESULT CALLED TO, READ BACK BY AND VERIFIED WITHMarcy Siren PHARMD 1932 06/19/16 A BROWNING    Methicillin resistance NOT DETECTED NOT DETECTED Final   Streptococcus species NOT DETECTED NOT DETECTED Final   Streptococcus agalactiae NOT DETECTED NOT DETECTED Final   Streptococcus pneumoniae NOT DETECTED NOT DETECTED Final   Streptococcus pyogenes NOT DETECTED NOT DETECTED Final   Acinetobacter baumannii NOT DETECTED NOT DETECTED Final   Enterobacteriaceae species NOT DETECTED NOT DETECTED  Final   Enterobacter cloacae complex NOT DETECTED NOT DETECTED Final   Escherichia coli NOT DETECTED NOT DETECTED Final   Klebsiella oxytoca NOT DETECTED NOT DETECTED Final   Klebsiella pneumoniae NOT DETECTED NOT DETECTED Final   Proteus species NOT DETECTED NOT DETECTED Final   Serratia marcescens NOT DETECTED NOT DETECTED Final   Haemophilus influenzae NOT DETECTED NOT DETECTED Final   Neisseria meningitidis NOT DETECTED NOT DETECTED Final   Pseudomonas aeruginosa NOT DETECTED NOT DETECTED Final   Candida albicans NOT DETECTED NOT DETECTED Final   Candida glabrata NOT DETECTED NOT DETECTED Final   Candida krusei NOT DETECTED NOT DETECTED Final   Candida parapsilosis NOT  DETECTED NOT DETECTED Final   Candida tropicalis NOT DETECTED NOT DETECTED Final    Comment: Performed at [facility], [place] [address] [place], [place] [address]  Culture, blood (Routine X 2) w Reflex to ID Panel     Status: None   Collection Time: 06/19/16 11:08 AM  Result Value Ref Range Status   Specimen Description BLOOD RIGHT ARM  Final   Special Requests IN PEDIATRIC BOTTLE Blood Culture adequate volume  Final   Culture   Final    NO GROWTH 5 DAYS Performed at [facility], [place] [address] [place], [place] [address]    Report Status 06/24/2016 FINAL  Final  Culture, blood (Routine X 2) w Reflex to ID Panel     Status: None   Collection Time: 06/19/16 11:08 AM  Result Value Ref Range Status   Specimen Description BLOOD RIGHT HAND  Final   Special Requests IN PEDIATRIC BOTTLE Blood Culture adequate volume  Final   Culture   Final    NO GROWTH 5 DAYS Performed at [facility], [place] [address] [place], [place] [address]    Report Status 06/24/2016 FINAL  Final  Body fluid culture     Status: None   Collection Time: 06/19/16 12:25 PM  Result Value Ref Range Status   Specimen Description SYNOVIAL RIGHT KNEE  Final   Special Requests NONE  Final   Gram Stain   Final    MODERATE WBC PRESENT,BOTH PMN AND MONONUCLEAR FEW GRAM POSITIVE COCCI IN PAIRS    Culture   Final    MODERATE STAPHYLOCOCCUS AUREUS NO ANAEROBES ISOLATED Performed at [facility], [place] [address] [place], [place] [address]    Report Status 06/22/2016 FINAL  Final   Organism ID, Bacteria STAPHYLOCOCCUS AUREUS  Final      Susceptibility   Staphylococcus aureus - MIC*    CIPROFLOXACIN <=0.5 SENSITIVE Sensitive     ERYTHROMYCIN <=0.25 SENSITIVE Sensitive     GENTAMICIN <=0.5 SENSITIVE Sensitive     OXACILLIN <=0.25 SENSITIVE Sensitive     TETRACYCLINE <=1 SENSITIVE Sensitive     VANCOMYCIN <=0.5 SENSITIVE Sensitive     TRIMETH/SULFA <=10 SENSITIVE Sensitive     CLINDAMYCIN <=0.25  SENSITIVE Sensitive     RIFAMPIN <=0.5 SENSITIVE Sensitive     Inducible Clindamycin NEGATIVE Sensitive     * MODERATE STAPHYLOCOCCUS AUREUS    Studies/Results: Dg Chest Port 1 View  Result Date: 06/23/2016 CLINICAL DATA:  Central line placement. EXAM: PORTABLE CHEST 1 VIEW COMPARISON:  06/17/2016 FINDINGS: Right arm PICC has its tip in the proximal right atrium. Withdrawal of 2 cm if this is desired to be above the right atrium. Pacemaker leads appear the same. Cardiomegaly. Chronic pulmonary scarring. IMPRESSION: Right arm PICC tip in the right atrium. Withdraw  2 cm to be at the SVC RA junction, if that is desired. Electronically Signed   By: Nelson Chimes M.D.   On: 06/23/2016 15:56      Assessment/Plan:  INTERVAL HISTORY:   Enterococcus S to AMP  Not having surgery or PM explantation   Principal Problem:   Sepsis ([place]) Active Problems:   Hypertension   Diabetes mellitus ([place])   Chronic kidney disease (CKD), stage III (moderate)   Cough   Leukocytosis   Pacemaker   Pressure injury of skin   Staphylococcus aureus bacteremia with sepsis ([place])   Pacemaker infection ([place])   Enterococcal bacteremia   Late onset Alzheimer's disease without behavioral disturbance   Leg DVT (deep venous thromboembolism), acute, bilateral ([place])   Acute renal failure superimposed on stage 3 chronic kidney disease ([name])    Randy [name] is a 83 y.o. male with  Pacemaker + MSSA and enterococcal bacteremia = infected pacemaker +/- endocarditis, septic PJI of right knee. He cannot have TEE, he cannot have PM removed or have AKA  #1 MSSA and enterococcal bacteremia with infected PM and PJI  -continue UNASYN and coplete 6 week course followed by indefinite oral augmentin   Diagnosis: MSSA and AMP S enterococcal bacteremia PM infection and PJI  Culture Result: MSSA and AMP S enterococus  No Known Allergies  Discharge antibiotics: IV unasyn  Duration: 6 weeks End Date:  June 15th  [facility]  Care Per Protocol:  Labs weekly while on IV antibiotics: _x_ CBC with differential _x_ BMP   _x_ Please pull PIC at completion of IV antibiotics __ Please leave PIC in place until doctor has seen patient or been notified  WHEN IV ANTIBIOTICS HE NEEDS TO IMMEDIATELY START ORAL AUGMENTIN 875/125 BID FOR THE REST OF HIS LIFE    Fax weekly labs to [phone]  Clinic Follow Up Appt:  Next 4 weeks with [place] in ID clinic  I will sign off for now  Please call with further questions.        LOS: 7 days   Alcide Evener 06/24/2016, 5:55 PM

## 2016-06-24 NOTE — Progress Notes (Signed)
PATIENT ID: TRAVIN MARIK  MRN: [id number]  DOB/AGE:  10/25/1924 / 83 y.o.  2 Days Post-Op Procedure(s): aborted TEE    PROGRESS NOTE Subjective:   Patient is alert, oriented, no Nausea, no Vomiting, yes passing gas, yes Bowel Movement.  Denies SOB, Chest or Calf Pain.  Ambulate - pt non ambulatory, Patient reports pain as mild,    Objective: Vital signs in last 24 hours: Temp:  [97.7 F (36.5 C)-99.5 F (37.5 C)] 98.1 F (36.7 C) (05/09 0310) Pulse Rate:  [32-90] 44 (05/09 0400) Resp:  [11-34] 24 (05/09 0400) BP: (123-157)/(46-78) 125/54 (05/09 0400) SpO2:  [88 %-99 %] 97 % (05/09 0400)    Intake/Output from previous day: I/O last 3 completed shifts: In: 6834 [P.O.:240; I.V.:370; IV Piggyback:600] Out: 1180 [Urine:1180]   Intake/Output this shift: No intake/output data recorded.   LABORATORY DATA:  Recent Labs  06/23/16 0331  06/23/16 1254 06/23/16 1641 06/23/16 2146 06/24/16 0348  WBC 11.2*  --   --   --   --  12.2*  HGB 11.1*  --   --   --   --  10.1*  HCT 33.9*  --   --   --   --  30.5*  PLT 350  --   --   --   --  404*  NA 139  --   --   --   --  138  K 3.0*  --   --   --   --  3.6  CL 112*  --   --   --   --  110  CO2 21*  --   --   --   --  21*  BUN 16  --   --   --   --  17  CREATININE 1.89*  --   --   --   --  1.69*  GLUCOSE 119*  --   --   --   --  185*  GLUCAP  --   < > 299* 273* 198*  --   CALCIUM 7.5*  --   --   --   --  7.8*  < > = values in this interval not displayed.  Examination: Neurologically intact Neurovascular intact Sensation intact distally Dorsiflexion/Plantar flexion intact pt has mild effusion.  2 + pitting edema in lower legs.  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  Plan:  Dr. Mayer Camel discussed situation with his son, [name].  The plan going forward is attempt at suppression with antibiotics and if the fluid re-accumulates at that point we will once again consider above-knee amputation.  This was discussed with the patient who does have dementia.  I am not sure how much he was able to retain.  Per Dr. Linus Salmons - 1. 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  On amp/sulbactam for MSSA and enterococcus.  Will need 6 weeks though June 14th After completing IV antibiotics, can pull picc line On June 15th, will need to start oral suppressive therapy with Augmentin 875 bid indefinitely - amoxicillin is a better option than Keflex due to Enterococcus which is not covered by Keflex    Bomani Oommen R 06/24/2016, 8:03 AM

## 2016-06-24 NOTE — Progress Notes (Addendum)
PROGRESS NOTE  Randy [name] ION:[id number] DOB: [birth year] DOA: 06/17/2016 PCP: System, Pcp Not In  Brief History:  83 y.o.gentleman with a history of mild dementia/cognitive impairment, CAD with prior stent placement, history of atrial fibrillation S/P PPM implant (CHADS-Vasc score of at least four, no anticoagulation), HTN, HLD, DM, and CKD 3 with baseline creatinine around 1.3-1.5 who was referred to the ED via EMS by one of his caregivers for evaluation of increased lethargy, mental status changes, and cough. The patient is nonambulatory at baseline and spends most of his day sitting up a recliner. He has caregiver support 24 hours/day. He has had cough for 4-5 days. He has had two known sick contacts (son-in-law and caregiver). He has had yellow sputum. No chest pain or shortness of breath. No nausea or vomiting/diarrhea. No abdominal pain. No dysuria/hematuria On 06/17/16, he was noted to have increased lethargy and muffled speech with slurring. No other focal weakenss. 911 was called and the patient was transferred to the ED for evaluation. No recent hospitalizations. No recent antibiotics. He has been using OTC mucinex for his symptoms.  Assessment/Plan: Sepsis secondary to MSSA/Enterococcus bacteremia/MSSA Right prosthetic knee infection -Secondary to bacteremia and prosthetic knee infection -Presented with fever, tachycardia, tachypnea, leukocytosis -Patient also had elevated pro calcitonin level as well as lactic acid -Initially placed on  vancomycin and zosyn, and transitioned to Unasyn -Chest x-ray, UA unremarkable for infection   -CT chest showed subpleural fibrosis, no acute pulmonary infiltrates. -Influenza PCR, strep pneumonia urine antigen negative -Blood cultures 06/17/2016: MSSA, enterococcus  -sepsis physiology resolved  MSSA/Enterococcus bacteremia/MSSA Right prosthetic knee infection -Blood cultures 06/17/2016: MSSA, E faecalis -Repeat blood cultures  06/19/16 no growth to date -Echocardiogram (TTE) showed EF 55-60%, moderate AS, trivial MR/TR/AI -Infectious disease consulted and appreciated, recommended TEE and EP consult -Cardiology consulted and appreciated- TEE attempted multiple times on 06/22/16- may need repeat attempt with general anesthesia vs long term IV antibiotics -Per cardiology, patient is at high risk of complication due to high risk comorbidities. No plan for device extraction at this time. -Orthopedic surgery consulted and appreciated for infected knee. S/p aspiration x2-->grew MSSA -Patient seen by Dr. Mayer Camel (ortho), patient would need an AKA, however patient seen as a poor surgical candidate. Ortho has discussed this with the patient's son.  If fluid re-accumulates, consideration for AKA. Continue IV antibiotics -Discussed case with infectious disease. Patient will need IV unasyn for 6 weeks, and followed by PO amox/clav for suppression thereafter. -06/22/16 PICC line -Cardio and ortho both recommend palliative care consult.  Essential hypertension -Currently on no home medications, continue IV hydralazine PRN  Diabetes mellitus, type II -Currently on no home medications -Continue insulin sliding scale, Levemir, CBG monitoring -check A1C -add novolog 4 units with meals -increase levemir to 6 units bid  History of atrial fibrillation -CHADSVASC 5 (based on age, DM, HTN, CAD) -previously not on [facility] -Status post pacemaker placement- cardiology consulted as patient does have bacteremia, as above, no plan for device extraction at this time.   Acute on Chronic kidney disease, stage III -Creatinine baseline at 1.2-1.5 -Serum creatinine peaked 2.03 -Continue to monitor BMP  Right and Left lower extremity DVT -Likely secondary to infected TKR -LE doppler--acute DVT Right common femoral, femoral, and popliteal veins and Left common femoral and femoral vein -start IV heparin  Candidiasis  -Groin area appears to be  erythematous and flaky -Continue diflucan  Stage II pressure injury -moisture related -wound  care consulted and appreciated  Moderate aortic stenosis -Noted on echocardiogram  Cognitive impairment -Per son patient can sometimes be clear minded but is not always  Dysphagia -Speech consulted and recommended FULL liquids due to abnormal MBS results -Speech will continue to follow for safest diet  Hypokalemia -K 3.0, continue to repletion -Monitor BMP  Hypomagnesemia -Magnesium 1.9, continue to monitor and replace as needed  Goals of care -Had discuss with son via phone regarding patient, current treatment and plan, code status, mental status. Per son, patient's mental status is not always clear and at this time he would like to remain full code and possible have surgeries that might be needed. -Cardio and ortho recommend palliative care consult. Discussed with son, does not feel palliative care consult is needed right now.  DVT Prophylaxis  lovenox  Code Status: Full  Family Communication: None at bedside.    Disposition Plan:  SNF 5/11 if cleared by consultants -Total time spent 35 minutes.  Greater than 50% spent face to face counseling and coordinating care.   Consultants Infectious disease Cardiology Orthopedics  Procedures  Echocardiogram Right knee aspiration x2 Attempted TEE  Antibiotics vanco 5/2>>> Unasyn 5/7>>> Nafcillin 5/3>>5/7      Subjective: Patient denies fevers, chills, headache, chest pain, dyspnea, nausea, vomiting, diarrhea, abdominal pain, dysuria, hematuria, hematochezia, and melena. Patient complains of right knee pain that is constant but better than it was. He states that movement of his knee causes worsening pain.  Objective: Vitals:   06/24/16 0310 06/24/16 0400 06/24/16 0800 06/24/16 1200  BP:  (!) 125/54 (!) 122/56   Pulse:  (!) 44 71   Resp:  (!) 24 (!) 24   Temp: 98.1 F (36.7 C)  98.1 F (36.7 C) 98.1 F  (36.7 C)  TempSrc: Oral  Oral Oral  SpO2:  97% 99%   Weight:      Height:        Intake/Output Summary (Last 24 hours) at 06/24/16 1218 Last data filed at 06/24/16 1121  Gross per 24 hour  Intake             1320 ml  Output              800 ml  Net              520 ml   Weight change:  Exam:   General:  Pt is alert, follows commands appropriately, not in acute distress  HEENT: No icterus, No thrush, No neck mass, [place]/AT  Cardiovascular: RRR, S1/S2, no rubs, no gallops  Respiratory: Bibasilar crackles., no wheezing, no crackles, no rhonchi  Abdomen: Soft/+BS, non tender, non distended, no guarding  Extremities: 2 + LE edema, No lymphangitis, No petechiae, No rashes, no synovitis   Data Reviewed: I have personally reviewed following labs and imaging studies Basic Metabolic Panel:  Recent Labs Lab 06/20/16 0351 06/21/16 0334 06/22/16 0352 06/23/16 0331 06/23/16 0349 06/24/16 0348  NA 143 141 139 139  --  138  K 3.1* 3.0* 3.2* 3.0*  --  3.6  CL 114* 114* 111 112*  --  110  CO2 22 19* 20* 21*  --  21*  GLUCOSE 116* 164* 123* 119*  --  185*  BUN 23* 21* 18 16  --  17  CREATININE 2.03* 1.79* 1.83* 1.89*  --  1.69*  CALCIUM 7.6* 7.2* 7.2* 7.5*  --  7.8*  MG  --   --  1.6*  --  1.9  --  PHOS  --   --  2.0*  --   --   --    Liver Function Tests:  Recent Labs Lab 06/17/16 2012  AST 21  ALT 8*  ALKPHOS 70  BILITOT 0.6  PROT 7.1  ALBUMIN 3.3*   No results for input(s): LIPASE, AMYLASE in the last 168 hours. No results for input(s): AMMONIA in the last 168 hours. Coagulation Profile:  Recent Labs Lab 06/18/16 0042  INR 1.29   CBC:  Recent Labs Lab 06/17/16 2012  06/20/16 0351 06/21/16 0334 06/22/16 0352 06/23/16 0331 06/24/16 0348  WBC 20.1*  < > 14.5* 11.5* 10.6* 11.2* 12.2*  NEUTROABS 15.7*  --   --   --   --   --   --   HGB 13.9  < > 11.9* 12.0* 11.3* 11.1* 10.1*  HCT 42.6  < > 37.1* 36.9* 34.8* 33.9* 30.5*  MCV 86.8  < > 86.3 85.2 85.1  84.3 83.8  PLT 273  < > 279 291 305 350 404*  < > = values in this interval not displayed. Cardiac Enzymes: No results for input(s): CKTOTAL, CKMB, CKMBINDEX, TROPONINI in the last 168 hours. BNP: Invalid input(s): POCBNP CBG:  Recent Labs Lab 06/23/16 1254 06/23/16 1641 06/23/16 2146 06/24/16 0815 06/24/16 1140  GLUCAP 299* 273* 198* 151* 271*   HbA1C: No results for input(s): HGBA1C in the last 72 hours. Urine analysis:    Component Value Date/Time   COLORURINE YELLOW 06/17/2016 1959   APPEARANCEUR CLEAR 06/17/2016 1959   LABSPEC 1.020 06/17/2016 1959   PHURINE 5.0 06/17/2016 1959   GLUCOSEU >=500 (A) 06/17/2016 1959   HGBUR SMALL (A) 06/17/2016 1959   BILIRUBINUR NEGATIVE 06/17/2016 1959   KETONESUR 5 (A) 06/17/2016 1959   PROTEINUR 30 (A) 06/17/2016 1959   UROBILINOGEN 0.2 05/18/2014 1634   NITRITE NEGATIVE 06/17/2016 1959   LEUKOCYTESUR NEGATIVE 06/17/2016 1959   Sepsis Labs: @LABRCNTIP (procalcitonin:4,lacticidven:4) ) Recent Results (from the past 240 hour(s))  Blood Culture (routine x 2)     Status: Abnormal   Collection Time: 06/17/16  7:52 PM  Result Value Ref Range Status   Specimen Description BLOOD BLOOD LEFT FOREARM  Final   Special Requests IN PEDIATRIC BOTTLE Blood Culture adequate volume  Final   Culture  Setup Time   Final    GRAM POSITIVE COCCI IN CLUSTERS IN PEDIATRIC BOTTLE CRITICAL VALUE NOTED.  VALUE IS CONSISTENT WITH PREVIOUSLY REPORTED AND CALLED VALUE.    Culture (A)  Final    STAPHYLOCOCCUS SPECIES (COAGULASE NEGATIVE) THE SIGNIFICANCE OF ISOLATING THIS ORGANISM FROM A SINGLE SET OF BLOOD CULTURES WHEN MULTIPLE SETS ARE DRAWN IS UNCERTAIN. PLEASE NOTIFY THE MICROBIOLOGY DEPARTMENT WITHIN ONE WEEK IF SPECIATION AND SENSITIVITIES ARE REQUIRED. Performed at [facility], [place] [address], [place], [place] [address]    Report Status 06/19/2016 FINAL  Final  Blood Culture (routine x 2)     Status: Abnormal   Collection Time: 06/17/16   8:12 PM  Result Value Ref Range Status   Specimen Description BLOOD RIGHT ANTECUBITAL  Final   Special Requests   Final    BOTTLES DRAWN AEROBIC AND ANAEROBIC Blood Culture results may not be optimal due to an inadequate volume of blood received in culture bottles   Culture  Setup Time   Final    GRAM POSITIVE COCCI IN CHAINS IN BOTH AEROBIC AND ANAEROBIC BOTTLES CRITICAL RESULT CALLED TO, READ BACK BY AND VERIFIED WITH: N. Glogovac Pharm.D. 15:45 06/18/16 ([email or web address]) Performed at  [facility], [place] [address], [place], [place] [address]    Culture STAPHYLOCOCCUS AUREUS ENTEROCOCCUS FAECALIS  (A)  Final   Report Status 06/21/2016 FINAL  Final   Organism ID, Bacteria STAPHYLOCOCCUS AUREUS  Final   Organism ID, Bacteria ENTEROCOCCUS FAECALIS  Final      Susceptibility   Enterococcus faecalis - MIC*    AMPICILLIN <=2 SENSITIVE Sensitive     VANCOMYCIN <=0.5 SENSITIVE Sensitive     GENTAMICIN SYNERGY SENSITIVE Sensitive     * ENTEROCOCCUS FAECALIS   Staphylococcus aureus - MIC*    CIPROFLOXACIN <=0.5 SENSITIVE Sensitive     ERYTHROMYCIN <=0.25 SENSITIVE Sensitive     GENTAMICIN <=0.5 SENSITIVE Sensitive     OXACILLIN <=0.25 SENSITIVE Sensitive     TETRACYCLINE <=1 SENSITIVE Sensitive     VANCOMYCIN <=0.5 SENSITIVE Sensitive     TRIMETH/SULFA <=10 SENSITIVE Sensitive     CLINDAMYCIN <=0.25 SENSITIVE Sensitive     RIFAMPIN <=0.5 SENSITIVE Sensitive     Inducible Clindamycin NEGATIVE Sensitive     * STAPHYLOCOCCUS AUREUS  Blood Culture ID Panel (Reflexed)     Status: Abnormal   Collection Time: 06/17/16  8:12 PM  Result Value Ref Range Status   Enterococcus species DETECTED (A) NOT DETECTED Final    Comment: CRITICAL RESULT CALLED TO, READ BACK BY AND VERIFIED WITH: N. Glogovac Pharm.D. 15:45 06/18/16 (wilsonm)    Vancomycin resistance NOT DETECTED NOT DETECTED Final   Listeria monocytogenes NOT DETECTED NOT DETECTED Final   Staphylococcus species DETECTED (A) NOT DETECTED Final      Comment: CRITICAL RESULT CALLED TO, READ BACK BY AND VERIFIED WITH: N. Glogovac Pharm.D. 15:45 06/18/16 (wilsonm)    Staphylococcus aureus DETECTED (A) NOT DETECTED Final    Comment: Methicillin (oxacillin) susceptible Staphylococcus aureus (MSSA). Preferred therapy is anti staphylococcal beta lactam antibiotic (Cefazolin or Nafcillin), unless clinically contraindicated. CRITICAL RESULT CALLED TO, READ BACK BY AND VERIFIED WITH: N. Glogovac Pharm.D. 15:45 06/18/16 (wilsonm)    Methicillin resistance NOT DETECTED NOT DETECTED Final   Streptococcus species NOT DETECTED NOT DETECTED Final   Streptococcus agalactiae NOT DETECTED NOT DETECTED Final   Streptococcus pneumoniae NOT DETECTED NOT DETECTED Final   Streptococcus pyogenes NOT DETECTED NOT DETECTED Final   Acinetobacter baumannii NOT DETECTED NOT DETECTED Final   Enterobacteriaceae species NOT DETECTED NOT DETECTED Final   Enterobacter cloacae complex NOT DETECTED NOT DETECTED Final   Escherichia coli NOT DETECTED NOT DETECTED Final   Klebsiella oxytoca NOT DETECTED NOT DETECTED Final   Klebsiella pneumoniae NOT DETECTED NOT DETECTED Final   Proteus species NOT DETECTED NOT DETECTED Final   Serratia marcescens NOT DETECTED NOT DETECTED Final   Haemophilus influenzae NOT DETECTED NOT DETECTED Final   Neisseria meningitidis NOT DETECTED NOT DETECTED Final   Pseudomonas aeruginosa NOT DETECTED NOT DETECTED Final   Candida albicans NOT DETECTED NOT DETECTED Final   Candida glabrata NOT DETECTED NOT DETECTED Final   Candida krusei NOT DETECTED NOT DETECTED Final   Candida parapsilosis NOT DETECTED NOT DETECTED Final   Candida tropicalis NOT DETECTED NOT DETECTED Final    Comment: Performed at [facility], [address], [place], [place] [address]  MRSA PCR Screening     Status: Abnormal   Collection Time: 06/18/16  1:40 AM  Result Value Ref Range Status   MRSA by PCR POSITIVE (A) NEGATIVE Final    Comment:        The  GeneXpert MRSA Assay (FDA approved for NASAL specimens  only), is one component of a comprehensive MRSA colonization surveillance program. It is not intended to diagnose MRSA infection nor to guide or monitor treatment for MRSA infections. RESULT CALLED TO, READ BACK BY AND VERIFIED WITH: C.SMITH,RN [id number] 06/18/16 W.SHEA   Culture, respiratory (NON-Expectorated)     Status: None   Collection Time: 06/18/16  6:32 AM  Result Value Ref Range Status   Specimen Description TRACHEAL ASPIRATE  Final   Special Requests NONE  Final   Gram Stain   Final    ABUNDANT WBC PRESENT, PREDOMINANTLY PMN RARE SQUAMOUS EPITHELIAL CELLS PRESENT MODERATE GRAM POSITIVE RODS GRAM POSITIVE COCCI IN PAIRS    Culture   Final    Consistent with normal respiratory flora. Performed at [facility], [place] [address] [place], [place] [address]    Report Status 06/20/2016 FINAL  Final  Culture, blood (Routine X 2) w Reflex to ID Panel     Status: None   Collection Time: 06/18/16  6:16 PM  Result Value Ref Range Status   Specimen Description BLOOD RIGHT ARM  Final   Special Requests IN PEDIATRIC BOTTLE Blood Culture adequate volume  Final   Culture   Final    NO GROWTH 5 DAYS Performed at [facility], [place] [address], [place], [place] [address]    Report Status 06/23/2016 FINAL  Final  Culture, blood (Routine X 2) w Reflex to ID Panel     Status: Abnormal   Collection Time: 06/18/16  6:22 PM  Result Value Ref Range Status   Specimen Description BLOOD RIGHT ANTECUBITAL  Final   Special Requests IN PEDIATRIC BOTTLE Blood Culture adequate volume  Final   Culture  Setup Time   Final    GRAM POSITIVE COCCI IN CLUSTERS IN PEDIATRIC BOTTLE CRITICAL RESULT CALLED TO, READ BACK BY AND VERIFIED WITH: D ZEIGLER PHARMD 1932 06/19/16 A BROWNING    Culture (A)  Final    STAPHYLOCOCCUS AUREUS SUSCEPTIBILITIES PERFORMED ON PREVIOUS CULTURE WITHIN THE LAST 5 DAYS. Performed at [facility], [place]  [address], [place], [place] [address]    Report Status 06/21/2016 FINAL  Final  Blood Culture ID Panel (Reflexed)     Status: Abnormal   Collection Time: 06/18/16  6:22 PM  Result Value Ref Range Status   Enterococcus species NOT DETECTED NOT DETECTED Final   Listeria monocytogenes NOT DETECTED NOT DETECTED Final   Staphylococcus species DETECTED (A) NOT DETECTED Final    Comment: CRITICAL RESULT CALLED TO, READ BACK BY AND VERIFIED WITHMarcy Siren PHARMD 1932 06/19/16 A BROWNING    Staphylococcus aureus DETECTED (A) NOT DETECTED Final    Comment: Methicillin (oxacillin) susceptible Staphylococcus aureus (MSSA). Preferred therapy is anti staphylococcal beta lactam antibiotic (Cefazolin or Nafcillin), unless clinically contraindicated. CRITICAL RESULT CALLED TO, READ BACK BY AND VERIFIED WITHMarcy Siren PHARMD 1932 06/19/16 A BROWNING    Methicillin resistance NOT DETECTED NOT DETECTED Final   Streptococcus species NOT DETECTED NOT DETECTED Final   Streptococcus agalactiae NOT DETECTED NOT DETECTED Final   Streptococcus pneumoniae NOT DETECTED NOT DETECTED Final   Streptococcus pyogenes NOT DETECTED NOT DETECTED Final   Acinetobacter baumannii NOT DETECTED NOT DETECTED Final   Enterobacteriaceae species NOT DETECTED NOT DETECTED Final   Enterobacter cloacae complex NOT DETECTED NOT DETECTED Final   Escherichia coli NOT DETECTED NOT DETECTED Final   Klebsiella oxytoca NOT DETECTED NOT DETECTED Final   Klebsiella pneumoniae NOT DETECTED NOT DETECTED Final   Proteus species  NOT DETECTED NOT DETECTED Final   Serratia marcescens NOT DETECTED NOT DETECTED Final   Haemophilus influenzae NOT DETECTED NOT DETECTED Final   Neisseria meningitidis NOT DETECTED NOT DETECTED Final   Pseudomonas aeruginosa NOT DETECTED NOT DETECTED Final   Candida albicans NOT DETECTED NOT DETECTED Final   Candida glabrata NOT DETECTED NOT DETECTED Final   Candida krusei NOT DETECTED NOT DETECTED Final   Candida parapsilosis  NOT DETECTED NOT DETECTED Final   Candida tropicalis NOT DETECTED NOT DETECTED Final    Comment: Performed at [facility], [place] [address], [place], [place] [address]  Culture, blood (Routine X 2) w Reflex to ID Panel     Status: None   Collection Time: 06/19/16 11:08 AM  Result Value Ref Range Status   Specimen Description BLOOD RIGHT ARM  Final   Special Requests IN PEDIATRIC BOTTLE Blood Culture adequate volume  Final   Culture   Final    NO GROWTH 5 DAYS Performed at [facility], [place] [address], [place], [place] [address]    Report Status 06/24/2016 FINAL  Final  Culture, blood (Routine X 2) w Reflex to ID Panel     Status: None   Collection Time: 06/19/16 11:08 AM  Result Value Ref Range Status   Specimen Description BLOOD RIGHT HAND  Final   Special Requests IN PEDIATRIC BOTTLE Blood Culture adequate volume  Final   Culture   Final    NO GROWTH 5 DAYS Performed at [facility], [place] [address] [place], [place] [address]    Report Status 06/24/2016 FINAL  Final  Body fluid culture     Status: None   Collection Time: 06/19/16 12:25 PM  Result Value Ref Range Status   Specimen Description SYNOVIAL RIGHT KNEE  Final   Special Requests NONE  Final   Gram Stain   Final    MODERATE WBC PRESENT,BOTH PMN AND MONONUCLEAR FEW GRAM POSITIVE COCCI IN PAIRS    Culture   Final    MODERATE STAPHYLOCOCCUS AUREUS NO ANAEROBES ISOLATED Performed at [facility], [place] [address] [place], [place] [address]    Report Status 06/22/2016 FINAL  Final   Organism ID, Bacteria STAPHYLOCOCCUS AUREUS  Final      Susceptibility   Staphylococcus aureus - MIC*    CIPROFLOXACIN <=0.5 SENSITIVE Sensitive     ERYTHROMYCIN <=0.25 SENSITIVE Sensitive     GENTAMICIN <=0.5 SENSITIVE Sensitive     OXACILLIN <=0.25 SENSITIVE Sensitive     TETRACYCLINE <=1 SENSITIVE Sensitive     VANCOMYCIN <=0.5 SENSITIVE Sensitive     TRIMETH/SULFA <=10 SENSITIVE Sensitive     CLINDAMYCIN <=0.25  SENSITIVE Sensitive     RIFAMPIN <=0.5 SENSITIVE Sensitive     Inducible Clindamycin NEGATIVE Sensitive     * MODERATE STAPHYLOCOCCUS AUREUS     Scheduled Meds: . chlorhexidine  15 mL Mouth Rinse BID  . Chlorhexidine Gluconate Cloth  6 each Topical Daily  . feeding supplement  1 Container Oral BID BM  . fluconazole  100 mg Oral Daily  . insulin aspart  0-20 Units Subcutaneous TID WC  . insulin aspart  0-5 Units Subcutaneous QHS  . insulin detemir  5 Units Subcutaneous BID  . liver oil-zinc oxide   Topical BID  . mouth rinse  15 mL Mouth Rinse q12n4p  . nystatin   Topical TID  . sodium chloride flush  10-40 mL Intracatheter Q12H   Continuous Infusions: . sodium chloride 10  mL/hr at 06/24/16 0600  . ampicillin-sulbactam (UNASYN) IV 3 g (06/24/16 1121)  . heparin      Procedures/Studies: Dg Chest 2 View  Result Date: 06/17/2016 CLINICAL DATA:  Lethargy and cough EXAM: CHEST  2 VIEW COMPARISON:  04/23/2014 FINDINGS: Cardiac shadow is stable. Pacing device is again noted. The lungs are well aerated bilaterally. No bony abnormality is noted. IMPRESSION: No active cardiopulmonary disease. Electronically Signed   By: Inez Catalina M.D.   On: 06/17/2016 20:38   Ct Chest Wo Contrast  Result Date: 06/18/2016 CLINICAL DATA:  Lethargy and cough for 2 days EXAM: CT CHEST WITHOUT CONTRAST TECHNIQUE: Multidetector CT imaging of the chest was performed following the standard protocol without IV contrast. COMPARISON:  06/17/2016, CT 04/17/2016 FINDINGS: Cardiovascular: Limited evaluation without intravenous contrast. Aortic atherosclerosis. No aneurysmal dilatation. Coronary artery calcification. Partially visualized cardiac pacing leads. Mild cardiomegaly. No large pericardial effusion. Mediastinum/Nodes: Midline trachea. Few prominent sub- carinal and periesophageal lymph nodes, measuring up to 13 mm. Esophagus within normal limits. Esophagus Lungs/Pleura: Mild subpleural fibrosis. No focal  consolidation, pleural effusion, or pneumothorax. Upper Abdomen: No acute abnormality. 9.6 cm cyst in the mid to upper right kidney. Vague hypodensity in the upper pole of the left kidney also consistent with a cyst Musculoskeletal: Degenerative changes of the spine. No acute or suspicious bone lesion. IMPRESSION: 1. Mild subpleural fibrosis. No acute pulmonary infiltrates are visualized. 2. Mild nonspecific mediastinal adenopathy 3. Cardiomegaly 4. Bilateral kidney cysts, measuring up to 9.6 cm on the right. Electronically Signed   By: Donavan Foil M.D.   On: 06/18/2016 00:26   Dg Chest Port 1 View  Result Date: 06/23/2016 CLINICAL DATA:  Central line placement. EXAM: PORTABLE CHEST 1 VIEW COMPARISON:  06/17/2016 FINDINGS: Right arm PICC has its tip in the proximal right atrium. Withdrawal of 2 cm if this is desired to be above the right atrium. Pacemaker leads appear the same. Cardiomegaly. Chronic pulmonary scarring. IMPRESSION: Right arm PICC tip in the right atrium. Withdraw 2 cm to be at the SVC RA junction, if that is desired. Electronically Signed   By: Nelson Chimes M.D.   On: 06/23/2016 15:56   Dg Swallowing Func-speech Pathology  Result Date: 06/19/2016 Objective Swallowing Evaluation: Type of Study: MBS-Modified Barium Swallow Study Patient Details Name: Randy [name] MRN: [id number] Date of Birth: [birth year] Today's Date: 06/19/2016 Time: SLP Start Time (ACUTE ONLY): 1520-SLP Stop Time (ACUTE ONLY): 1545 SLP Time Calculation (min) (ACUTE ONLY): 25 min Past Medical History: Past Medical History: Diagnosis Date . Anxiety  . Arthritis   R knee,  . CKD (chronic kidney disease)  . Coronary artery disease  . Diabetes mellitus without complication ([place])  . Dysrhythmia   PAF;,sss s/p St. Jude PPM . Hyperlipidemia  . Hypertension  . MI (myocardial infarction) ([place]) 2014 . Presence of permanent cardiac pacemaker  Past Surgical History: Past Surgical History: Procedure Laterality Date . CHOLECYSTECTOMY   .  CORONARY ANGIOPLASTY    mLAD '13, RCA DES 03/2013 . EXCISIONAL TOTAL KNEE ARTHROPLASTY WITH ANTIBIOTIC SPACERS Right 04/20/2014  Procedure: IRRIGATION AND DEBRIDMENT RIGHT TOTAL KNEE REMOVE  ACL IMPLANTED AND PLACE SPACER;  Surgeon: [name], MD;  Location: [facility];  Service: Orthopedics;  Laterality: Right; . HEMORROIDECTOMY   . KNEE SURGERY  2006 . PACEMAKER INSERTION   . PICC LINE PLACE PERIPHERAL ([place] [facility])  04/2014  R upper arm  . TOTAL KNEE ARTHROPLASTY WITH REVISION COMPONENTS Right 06/11/2014  Procedure: TOTAL KNEE ARTHROPLASTY WITH REVISION COMPONENTS  REMOVE SPACER PLACE TKA;  Surgeon: [name], MD;  Location: [facility];  Service: Orthopedics;  Laterality: Right; HPI: pt admit to [facility] with AMS, cough- found to be bacterial septic.  PMH + for DM, MI, no CVA hx. CT chest and CXR negative.  Swallow eval ordered due to pt having dysphagia.  Subjective: pt awake in chair Assessment / Plan / Recommendation CHL IP CLINICAL IMPRESSIONS 06/19/2016 Clinical Impression Pt presents with severe pharyngeal dysphagia - narrow pharynx and decreased tongue base retraction results in very poor epiglottic deflection and gross pharyngeal/vallecular residuals.  Pt requires multiple swallows (up to 5-6) with each bolus and still has vallecular residuals. He did not aspirate or penetrate surprisingly.  Residuals were much worse with solids/pudding than liquids.  Various strategies/postures including head turn left, multiple swallows, following solid with liquids mitigate dysphagia.  Pt did not fully clear vallecular space but can fortunately hock and expectorate to clear.  Pt reports his swallow ability during MBS is "normal" for him causing SLP to suspect baseline significant dysphagia that he has been managing.  Recommend strict precautions to mitigate aspiration risk and modify diet to full liquids. Using teach back, pt able to demonstrate multiple swallows, "hocking" and oral suctioning independently.  Will follow for po tolerance,  readiness for dietary advancement.   SLP Visit Diagnosis Dysphagia, oropharyngeal phase (R13.12) Attention and concentration deficit following -- Frontal lobe and executive function deficit following -- Impact on safety and function Severe aspiration risk;Risk for inadequate nutrition/hydration   CHL IP TREATMENT RECOMMENDATION 06/19/2016 Treatment Recommendations Therapy as outlined in treatment plan below   Prognosis 06/19/2016 Prognosis for Safe Diet Advancement Fair Barriers to Reach Goals Time post onset Barriers/Prognosis Comment -- CHL IP DIET RECOMMENDATION 06/19/2016 SLP Diet Recommendations Nectar thick liquid;Thin liquid Liquid Administration via Cup;Straw Medication Administration (No Data) Compensations Slow rate;Small sips/bites;Follow solids with liquid;Multiple dry swallows after each bite/sip Postural Changes Seated upright at 90 degrees;Remain semi-upright after after feeds/meals (Comment)   CHL IP OTHER RECOMMENDATIONS 06/19/2016 Recommended Consults -- Oral Care Recommendations Oral care before and after PO Other Recommendations --   CHL IP FOLLOW UP RECOMMENDATIONS 06/19/2016 Follow up Recommendations (No Data)   CHL IP FREQUENCY AND DURATION 06/19/2016 Speech Therapy Frequency (ACUTE ONLY) min 2x/week Treatment Duration 1 week      CHL IP ORAL PHASE 06/19/2016 Oral Phase WFL Oral - Pudding Teaspoon -- Oral - Pudding Cup -- Oral - Honey Teaspoon -- Oral - Honey Cup -- Oral - Nectar Teaspoon -- Oral - Nectar Cup WFL Oral - Nectar Straw WFL Oral - Thin Teaspoon -- Oral - Thin Cup WFL Oral - Thin Straw WFL Oral - Puree WFL Oral - Mech Soft WFL Oral - Regular WFL Oral - Multi-Consistency -- Oral - Pill -- Oral Phase - Comment --  CHL IP PHARYNGEAL PHASE 06/19/2016 Pharyngeal Phase Impaired Pharyngeal- Pudding Teaspoon -- Pharyngeal -- Pharyngeal- Pudding Cup -- Pharyngeal -- Pharyngeal- Honey Teaspoon -- Pharyngeal -- Pharyngeal- Honey Cup -- Pharyngeal -- Pharyngeal- Nectar Teaspoon -- Pharyngeal -- Pharyngeal-  Nectar Cup Reduced pharyngeal peristalsis;Pharyngeal residue - valleculae;Reduced tongue base retraction Pharyngeal -- Pharyngeal- Nectar Straw Reduced pharyngeal peristalsis;Reduced tongue base retraction;Pharyngeal residue - valleculae Pharyngeal -- Pharyngeal- Thin Teaspoon -- Pharyngeal -- Pharyngeal- Thin Cup Reduced epiglottic inversion;Reduced tongue base retraction;Reduced pharyngeal peristalsis Pharyngeal -- Pharyngeal- Thin Straw Reduced pharyngeal peristalsis;Reduced epiglottic inversion;Pharyngeal residue - valleculae Pharyngeal -- Pharyngeal- Puree Reduced epiglottic inversion;Reduced pharyngeal peristalsis;Pharyngeal residue - valleculae Pharyngeal -- Pharyngeal- Mechanical Soft -- Pharyngeal -- Pharyngeal- Regular  Reduced pharyngeal peristalsis;Reduced epiglottic inversion;Reduced tongue base retraction;Pharyngeal residue - valleculae Pharyngeal -- Pharyngeal- Multi-consistency -- Pharyngeal -- Pharyngeal- Pill -- Pharyngeal -- Pharyngeal Comment pt without awareness to mild leftover vallecular residuals, he is able to clear/expectorate with cues   CHL IP CERVICAL ESOPHAGEAL PHASE 06/19/2016 Cervical Esophageal Phase WFL Pudding Teaspoon -- Pudding Cup -- Honey Teaspoon -- Honey Cup -- Nectar Teaspoon -- Nectar Cup -- Nectar Straw -- Thin Teaspoon -- Thin Cup -- Thin Straw -- Puree -- Mechanical Soft -- Regular -- Multi-consistency -- Pill -- Cervical Esophageal Comment -- No flowsheet data found. Luanna Salk, MS [facility] SLP [phone]               Yuto Cajuste, DO  [facility] Pager [phone]  If 7PM-7AM, please contact night-coverage [email or web address] Password [email or web address] 06/24/2016, 12:18 PM   LOS: 7 days

## 2016-06-24 NOTE — Progress Notes (Signed)
ANTICOAGULATION CONSULT NOTE - Initial Consult  Pharmacy Consult for heparin Indication: DVT  No Known Allergies  Patient Measurements: Height: 5\' 11"  (180.3 cm) Weight: 212 lb 4.9 oz (96.3 kg) IBW/kg (Calculated) : 75.3 Heparin Dosing Weight: 94 kg  Vital Signs: Temp: 98.1 F (36.7 C) (05/09 0800) Temp Source: Oral (05/09 0800) BP: 122/56 (05/09 0800) Pulse Rate: 71 (05/09 0800)  Labs:  Recent Labs  06/22/16 0352 06/23/16 0331 06/24/16 0348  HGB 11.3* 11.1* 10.1*  HCT 34.8* 33.9* 30.5*  PLT 305 350 404*  CREATININE 1.83* 1.89* 1.69*    Estimated Creatinine Clearance: 34.4 mL/min (A) (by C-G formula based on SCr of 1.69 mg/dL (H)).   Medical History: Past Medical History:  Diagnosis Date  . Anxiety   . Arthritis    R knee,   . CKD (chronic kidney disease)   . Coronary artery disease   . Diabetes mellitus without complication ([place])   . Dysrhythmia    PAF;,sss s/p St. Jude PPM  . Hyperlipidemia   . Hypertension   . MI (myocardial infarction) ([place]) 2014  . Presence of permanent cardiac pacemaker     Assessment: [name] admitted for sepsis secondary to MSSA/Enterococcus bacteremia, recurrent right prosthetic knee infection, groin cellulitis.  Bilateral lower extremity venous duplex completed today shows bilateral acute DVTs.  Pharmacy consulted to start heparin.  Currently on Lovenox 30 mg q24h since admission.  Baseline anticoag labs on admission = aPTT 34 seconds and INR 1.29 Today's CBC: Hgb 10.1, platelets WNL Renal function: AKI slowly resolving, CrCl~39 ml/min Last 30 mg Lovenox dose given recently at 1032.  Goal of Therapy:  Heparin level 0.3-0.7 units/ml Monitor platelets by anticoagulation protocol: Yes   Plan:  Stop prophylactic Lovenox. Start heparin infusion at 1400 units/hr per [place] nomogram.  No bolus given recent Lovenox administration. Check heparin level in 8 hours. Daily CBC.  Randy [name] 06/24/2016,11:55 AM

## 2016-06-24 NOTE — Progress Notes (Signed)
PT Cancellation Note  Patient Details Name: Randy [name] MRN: [id number] DOB: 1925-01-27   Cancelled Treatment:    Reason Eval/Treat Not Completed: Medical issues which prohibited therapy (RN reports new DVT, MD paged, and RN awaiting any new orders)   Autumn Gunn,KATHrine E 06/24/2016, 11:14 AM Carmelia Bake, PT, DPT 06/24/2016 Pager: [phone]

## 2016-06-24 NOTE — Progress Notes (Signed)
OT Cancellation Note  Patient Details Name: RUFFUS KAMAKA MRN: [id number] DOB: [birth year]   Cancelled Treatment:    Reason Eval/Treat Not Completed: Medical issues which prohibited therapy.  Pt with new DVT:  RN is awaiting new orders  Jaber Dunlow 06/24/2016, [place], OTR/L [phone] 06/24/2016

## 2016-06-24 NOTE — Progress Notes (Signed)
VASCULAR LAB PRELIMINARY  PRELIMINARY  PRELIMINARY  PRELIMINARY  Bilateral lower extremity venous duplex completed.    Preliminary report:  Right - There appears to be an acute DVT with minimal flow noted in the common femoral, femoral, and popliteal veins. The distal posterior tibial appears patent. Unable to visual the peroneal or the posterior tibial vein in its' entirety due to swelling. Left- Also noted is an acute DVT in the mid to distal common femoral and femoral vein. Once again the peroneal could not be visualized as well as all segments of the posterior tibial vein due to swelling. Compressions of the veins were difficult due to swelling.. No obvious evidence of superficial thrombosis or Baker's cyst bilaterally. There is prominent arterial flow throughout bilaterally.  [place], RVS 06/24/2016, 10:09 AM

## 2016-06-24 NOTE — Progress Notes (Signed)
ANTICOAGULATION CONSULT NOTE -   Pharmacy Consult for heparin Indication: DVT  No Known Allergies  Patient Measurements: Height: 5\' 11"  (180.3 cm) Weight: 212 lb 4.9 oz (96.3 kg) IBW/kg (Calculated) : 75.3 Heparin Dosing Weight: 94 kg  Vital Signs: Temp: 98.8 F (37.1 C) (05/09 1551) Temp Source: Oral (05/09 1551) BP: 131/72 (05/09 1551) Pulse Rate: 79 (05/09 1551)  Labs:  Recent Labs  06/22/16 0352 06/23/16 0331 06/24/16 0348 06/24/16 2014  HGB 11.3* 11.1* 10.1*  --   HCT 34.8* 33.9* 30.5*  --   PLT 305 350 404*  --   HEPARINUNFRC  --   --   --  0.35  CREATININE 1.83* 1.89* 1.69*  --     Estimated Creatinine Clearance: 34.4 mL/min (A) (by C-G formula based on SCr of 1.69 mg/dL (H)).   Medical History: Past Medical History:  Diagnosis Date  . Anxiety   . Arthritis    R knee,   . CKD (chronic kidney disease)   . Coronary artery disease   . Diabetes mellitus without complication ([place])   . Dysrhythmia    PAF;,sss s/p St. Jude PPM  . Hyperlipidemia   . Hypertension   . MI (myocardial infarction) ([place]) 2014  . Presence of permanent cardiac pacemaker     Assessment: 72 yoM admitted for sepsis secondary to MSSA/Enterococcus bacteremia, recurrent right prosthetic knee infection, groin cellulitis.  Bilateral lower extremity venous duplex completed today shows bilateral acute DVTs. Pt was initially on enoxaparin.  Pharmacy consulted to start heparin.    Baseline anticoag labs on admission = aPTT 34 seconds and INR 1.29 Today's CBC: Hgb 10.1, platelets WNL Renal function: AKI slowly resolving, CrCl~39 ml/min Last 30 mg Lovenox dose given recently at 1032. 1st HL= 0.35 therapeutic  Goal of Therapy:  Heparin level 0.3-0.7 units/ml Monitor platelets by anticoagulation protocol: Yes   Plan:  continue heparin infusion at 1400 units/hr  Check heparin level in 8 hours. Daily CBC.  [name] RPh 06/24/2016, 9:16 PM Pager [phone]

## 2016-06-24 NOTE — Progress Notes (Signed)
CSW spoke with son today to assist with d/c planning. Son reports that pt will return home at d/c unless pt requires treatment that cannot be managed at home. Son reports that pt has had IV treatment at home in the past without difficulty. Pt is non ambulatory. CSW is available to assist with SNF placement if plan changes and placement is needed.  Werner Lean LCSW [phone]

## 2016-06-25 LAB — BASIC METABOLIC PANEL
ANION GAP: 6 (ref 5–15)
BUN: 15 mg/dL (ref 6–20)
CHLORIDE: 110 mmol/L (ref 101–111)
CO2: 22 mmol/L (ref 22–32)
Calcium: 7.7 mg/dL — ABNORMAL LOW (ref 8.9–10.3)
Creatinine, Ser: 1.71 mg/dL — ABNORMAL HIGH (ref 0.61–1.24)
GFR calc Af Amer: 39 mL/min — ABNORMAL LOW (ref >60)
GFR calc non Af Amer: 33 mL/min — ABNORMAL LOW (ref >60)
GLUCOSE: 140 mg/dL — AB (ref 65–99)
POTASSIUM: 3.4 mmol/L — AB (ref 3.5–5.1)
Sodium: 138 mmol/L (ref 135–145)

## 2016-06-25 LAB — MAGNESIUM: Magnesium: 1.8 mg/dL (ref 1.7–2.4)

## 2016-06-25 LAB — GLUCOSE, CAPILLARY
GLUCOSE-CAPILLARY: 135 mg/dL — AB (ref 65–99)
GLUCOSE-CAPILLARY: 238 mg/dL — AB (ref 65–99)
GLUCOSE-CAPILLARY: 225 mg/dL — AB (ref 65–99)
GLUCOSE-CAPILLARY: 165 mg/dL — AB (ref 65–99)

## 2016-06-25 LAB — HEPARIN LEVEL (UNFRACTIONATED)
HEPARIN UNFRACTIONATED: 0.32 [IU]/mL (ref 0.30–0.70)
HEPARIN UNFRACTIONATED: 0.25 [IU]/mL — AB (ref 0.30–0.70)

## 2016-06-25 MED ORDER — ZINC OXIDE 40 % EX OINT: TOPICAL_OINTMENT | Freq: Two times a day (BID) | CUTANEOUS | Status: DC

## 2016-06-25 MED ORDER — POTASSIUM CHLORIDE CRYS ER 20 MEQ PO TBCR
20.0000 meq | EXTENDED_RELEASE_TABLET | Freq: Once | ORAL | Status: AC
  Administered 2016-06-25: 20 meq via ORAL
  Filled 2016-06-25: qty 1

## 2016-06-25 MED ORDER — GERHARDT'S BUTT CREAM
TOPICAL_CREAM | Freq: Three times a day (TID) | CUTANEOUS | Status: AC
  Administered 2016-06-25 – 2016-06-27 (×5): via TOPICAL
  Administered 2016-06-27: 1 via TOPICAL
  Administered 2016-06-27 – 2016-07-04 (×22): via TOPICAL
  Filled 2016-06-25 (×4): qty 1

## 2016-06-25 MED ORDER — NYSTATIN 100000 UNIT/GM EX POWD: Freq: Three times a day (TID) | CUTANEOUS | Status: DC

## 2016-06-25 MED ORDER — HEPARIN (PORCINE) IN NACL 100-0.45 UNIT/ML-% IJ SOLN
1600.0000 [IU]/h | INTRAMUSCULAR | Status: AC
  Administered 2016-06-25 – 2016-06-27 (×4): 1600 [IU]/h via INTRAVENOUS
  Filled 2016-06-25 (×2): qty 250

## 2016-06-25 NOTE — Progress Notes (Signed)
PHARMACY CONSULT NOTE FOR:  OUTPATIENT  PARENTERAL ANTIBIOTIC THERAPY (OPAT)  Indication: MSSA and enterococcal & prosthetic joint infection  Regimen: unasyn 3 gm IV q6h End date: 07/31/16  IV antibiotic discharge orders are pended. To discharging provider:  please sign these orders via discharge navigator,  Select New Orders & click on the button choice - Manage This Unsigned Work.     Thank you for allowing pharmacy to be a part of this patient's care.  Dia Sitter P 06/25/2016, 9:15 AM

## 2016-06-25 NOTE — Progress Notes (Signed)
ANTICOAGULATION CONSULT NOTE -   Pharmacy Consult for heparin Indication: DVT  No Known Allergies  Patient Measurements: Height: 5\' 11"  (180.3 cm) Weight: 212 lb 4.9 oz (96.3 kg) IBW/kg (Calculated) : 75.3 Heparin Dosing Weight: 94 kg  Vital Signs: Temp: 98.5 F (36.9 C) (05/10 1318) Temp Source: Oral (05/10 1318) BP: 141/68 (05/10 1318) Pulse Rate: 46 (05/10 1318)  Labs:  Recent Labs  06/23/16 0331 06/24/16 0348 06/24/16 2014 06/25/16 0435 06/25/16 1559  HGB 11.1* 10.1*  --   --   --   HCT 33.9* 30.5*  --   --   --   PLT 350 404*  --   --   --   HEPARINUNFRC  --   --  0.35 0.32 0.25*  CREATININE 1.89* 1.69*  --  1.71*  --     Estimated Creatinine Clearance: 34 mL/min (A) (by C-G formula based on SCr of 1.71 mg/dL (H)).   Medical History: Past Medical History:  Diagnosis Date  . Anxiety   . Arthritis    R knee,   . CKD (chronic kidney disease)   . Coronary artery disease   . Diabetes mellitus without complication ([place])   . Dysrhythmia    PAF;,sss s/p St. Jude PPM  . Hyperlipidemia   . Hypertension   . MI (myocardial infarction) ([place]) 2014  . Presence of permanent cardiac pacemaker     Assessment: 30 yoM admitted for sepsis secondary to MSSA/Enterococcus bacteremia, recurrent right prosthetic knee infection, groin cellulitis.  Bilateral lower extremity venous duplex completed today shows bilateral acute DVTs. Pt was initially on prophylactic enoxaparin.  Pharmacy consulted to start heparin.  Today, 06/25/2016: - Heparin level subtheraputic at 0.25 units/ml despite increasing infusion rate earlier today - Yesterday's CBC Hgb 10.1, platelets WNL. Daily CBC ordered by pharmacy discontinued.  CBC with differential ordered for tomorrow by MD. No bleeding/complications reported.  - AKI slow to improve, SCr remains slightly above baseline. CrCl~34 ml/min - If no further surgical procedures, TRH plans to transition to apixaban 5/11  Goal of Therapy:  Heparin  level 0.3-0.7 units/ml Monitor platelets by anticoagulation protocol: Yes   Plan:  Increase heparin infusion to 1600 units/hr. Check heparin level in 8 hours. F/u plans for transition to apixaban.  Hershal Coria, PharmD, BCPS Pager: [phone] 06/25/2016 4:49 PM

## 2016-06-25 NOTE — Progress Notes (Signed)
Physical Therapy Treatment Patient Details Name: Randy [name] MRN: [id number] DOB: Oct 17, 1924 Today's Date: 06/25/2016    History of Present Illness Randy [name] is a 83 y.o. gentleman with a history of mild dementia/cognitive impairment, CAD with prior stent placement, history of atrial fibrillation S/P PPM implant  HTN, HLD, DM, and CKD ,infected right TKA(multiple surgeries.) was referred to the ED via EMS by one of his caregivers for evaluation of increased lethargy, mental status changes, and cough.  TPer historypatient is nonambulatory at baseline and spends most of his day sitting up a recliner. Aspiration of the  right knee performed.    PT Comments    Assisted OOB to recliner + 2 assist pivot 1/4 turn with partial ability to support self requiring increased assist to complete turn.  Prior pt stated he was able to transfer with min help at home and sits in hid recliner chair most of the day.    Follow Up Recommendations  Home health PT;SNF;Supervision/Assistance - 24 hour (per chart review son plans to take pt home)     Equipment Recommendations       Recommendations for Other Services       Precautions / Restrictions Precautions Precautions: Fall Precaution Comments: Flexisealm incontince.  Restrictions Weight Bearing Restrictions: No    Mobility  Bed Mobility Overal bed mobility: Needs Assistance Bed Mobility: Supine to Sit     Supine to sit: Max assist;+2 for physical assistance;+2 for safety/equipment     General bed mobility comments: + 2 assist and use of bed pad to complete scooting.  Once upright pt required Min Assist to prevent posterior LOB.   Transfers Overall transfer level: Needs assistance Equipment used: 2 person hand held assist Transfers: Stand Pivot Transfers   Stand pivot transfers: Total assist;+2 physical assistance;+2 safety/equipment (pt 15%)       General transfer comment: + 2 assist from elevated bed 1/4 turn to recliner.  sit to  stand pt 25% but to complete pivot turn pt 15%.    Ambulation/Gait             General Gait Details: transfer only   Financial trader Rankin (Stroke Patients Only)       Balance                                            Cognition Arousal/Alertness: Awake/alert Behavior During Therapy: WFL for tasks assessed/performed Overall Cognitive Status: No family/caregiver present to determine baseline cognitive functioning Area of Impairment: Orientation;Memory;Awareness                 Orientation Level: Place;Time;Situation             General Comments: pt following all commands and stated "I don't walk"`      Exercises      General Comments        Pertinent Vitals/Pain Pain Assessment: No/denies pain    Home Living Family/patient expects to be discharged to:: Private residence Living Arrangements: Non-relatives/Friends Available Help at Discharge: Personal care attendant;Family   Home Access: Ramped entrance   Home Layout: One level Home Equipment: [place] - 2 wheels Additional Comments: from previous  encounter, lives at home with invalid daughter ith 24/7 caregivers.    Prior Function  Comments: pt. report that he is up adlib, fixes breakfast. chart indicates nonambulatory.   PT Goals (current goals can now be found in the care plan section) Progress towards PT goals: Progressing toward goals    Frequency    Min 3X/week      PT Plan Current plan remains appropriate    Co-evaluation              AM-PAC PT "6 Clicks" Daily Activity  Outcome Measure  Difficulty turning over in bed (including adjusting bedclothes, sheets and blankets)?: Total Difficulty moving from lying on back to sitting on the side of the bed? : Total Difficulty sitting down on and standing up from a chair with arms (e.g., wheelchair, bedside commode, etc,.)?: Total Help needed moving to and from  a bed to chair (including a wheelchair)?: Total Help needed walking in hospital room?: Total Help needed climbing 3-5 steps with a railing? : Total 6 Click Score: 6    End of Session Equipment Utilized During Treatment: Gait belt Activity Tolerance: Patient tolerated treatment well Patient left: in chair;with call bell/phone within reach Nurse Communication: Mobility status;Need for lift equipment PT Visit Diagnosis: Muscle weakness (generalized) (M62.81)     Time: 1025-1050 PT Time Calculation (min) (ACUTE ONLY): 25 min  Charges:  $Therapeutic Activity: 23-37 mins                    G Codes:        Rica Koyanagi  PTA [facility]  Acute  Rehab Pager      [phone]

## 2016-06-25 NOTE — Progress Notes (Signed)
Patient ID: Randy [name], male   DOB: June 02, 1924, 83 y.o.   MRN: [id number] Subjective: Patient resting comfortably in bed.  Had Dopplers done yesterday showing a large, deep femoral vein clots in both legs and is presently on full anticoagulation with heparin as he was already on DVT prophylaxis when he got the clots.  Have discussed situation with Randy [name] of vascular surgery who cautions against any surgical intervention .  While on full dose heparin. Reports that his knee pain is mild.  Objective: The right total knee has a trace effusion.  Range of motion today is from 15-45.  He still has impressive distal peripheral edema and remains on IV antibiotics.  Assessment: Recurrent staph infection and revision right total knee that was revised for infection 2 years ago.  Patient has multiple medical issues and is a poor candidate for any surgery at this time.  Plan: Continue to monitor knee may I have to place a percutaneous drain at the bedside tomorrow if he continues to accumulate fluid in the knee.  Because of the full dose heparin anticoagulation.  He is not a candidate for AKA at this time and would probably not survive surgery.  Recommend palliative care at this point.

## 2016-06-25 NOTE — Progress Notes (Signed)
Pharmacy Antibiotic Note  Randy [name] is a 83 y.o. male with pacemaker and prosthetic knee admitted on 06/17/2016 with sepsis. ID narrowing antibiotic therapy today from Nafcillin and Vancomyin to Unasyn for right prosthetic joint infection and bacteremia due to MSSA, CoNS, and ampicillin sensitive Enterococcus. TTE negative for vegetations. Unable to perform TEE.  Not surgical candidate for AKA.  ID recommends to treat with 6 weeks of IV abx (thru 07/30/16) and then transition to augmentin indefinitely for suppression.  - PICC placed on 06/23/16  Today, 06/25/16:  - day #9 of abx - afeb, wbc 12.2 on 5/9 - scr 1.71 (crcl~34)  Plan: - continue Unasyn 3g IV q6h. - Monitor renal function closely - Continue Fluconazole dosing per MD.   _________________________  Height: 5\' 11"  (180.3 cm) Weight: 212 lb 4.9 oz (96.3 kg) IBW/kg (Calculated) : 75.3  Temp (24hrs), Avg:98.3 F (36.8 C), Min:98.1 F (36.7 C), Max:98.8 F (37.1 C)   Recent Labs Lab 06/20/16 0351 06/20/16 2116 06/21/16 0334 06/22/16 0352 06/23/16 0331 06/24/16 0348 06/25/16 0435  WBC 14.5*  --  11.5* 10.6* 11.2* 12.2*  --   CREATININE 2.03*  --  1.79* 1.83* 1.89* 1.69* 1.71*  VANCOTROUGH  --  14*  --   --   --   --   --     Estimated Creatinine Clearance: 34 mL/min (A) (by C-G formula based on SCr of 1.71 mg/dL (H)).    No Known Allergies  Antimicrobials this admission:  5/2 vanc >> 5/7 5/2 zosyn >> 5/3 5/3 naficillin >> 5/7 5/3 fluconazole >> 5/7 unasyn >> (6/15)   Dose adjustments this admission:  5/5 2130 VT: 14 mg/L, continue Vanc 1 Gm IV q24h f/u scr daily  Microbiology results:  5/2 BCx: 1/2 with MSSA, Enterococcus faecalis (ampicillin sensitive); 1/2 with CoNS FINAL 5/2 influenza PCR: neg 5/2 strep/legionella ur ag: neg 5/3 MRSA PCR: positive 5/3 Trach aspirate: c/w normal respiratory flora 5/3 BCx: 1/2 Staph aureus (BCID = MSSA) FINAL 5/4 synovial fluid from R knee: moderate MSSA FINAL 5/4  BCx x2: neg FINAL   Thank you for allowing pharmacy to be a part of this patient's care.  Dia Sitter, PharmD, BCPS 06/25/2016 8:56 AM

## 2016-06-25 NOTE — Progress Notes (Signed)
ANTICOAGULATION CONSULT NOTE - Follow Up Consult  Pharmacy Consult for Heparin Indication: DVT  No Known Allergies  Patient Measurements: Height: 5\' 11"  (180.3 cm) Weight: 212 lb 4.9 oz (96.3 kg) IBW/kg (Calculated) : 75.3 Heparin Dosing Weight:   Vital Signs: Temp: 98.2 F (36.8 C) (05/10 0551) Temp Source: Oral (05/10 0551) BP: 118/45 (05/10 0551) Pulse Rate: 67 (05/10 0551)  Labs:  Recent Labs  06/23/16 0331 06/24/16 0348 06/24/16 2014 06/25/16 0435  HGB 11.1* 10.1*  --   --   HCT 33.9* 30.5*  --   --   PLT 350 404*  --   --   HEPARINUNFRC  --   --  0.35 0.32  CREATININE 1.89* 1.69*  --  1.71*    Estimated Creatinine Clearance: 34 mL/min (A) (by C-G formula based on SCr of 1.71 mg/dL (H)).   Medications:  Infusions:  . sodium chloride 10 mL/hr at 06/24/16 0600  . sodium chloride    . ampicillin-sulbactam (UNASYN) IV Stopped (06/25/16 0539)  . heparin 1,400 Units/hr (06/24/16 1348)    Assessment: Patient with heparin level at goal but lower end of goal.  No heparin issues per RN.  Goal of Therapy:  Heparin level 0.3-0.7 units/ml Monitor platelets by anticoagulation protocol: Yes   Plan:  Increase heparin to 1450 units/hr Recheck level at [place], [place] 06/25/2016,6:49 AM

## 2016-06-25 NOTE — Progress Notes (Addendum)
PROGRESS NOTE  Randy [name] NLZ:[id number] DOB: [birth year] DOA: 06/17/2016 PCP: System, Pcp Not In  Brief History:  83 y.o.gentleman with a history of mild dementia/cognitive impairment, CAD with prior stent placement, history of atrial fibrillation S/P PPM implant (CHADS-Vasc score of at least four, no anticoagulation), HTN, HLD, DM, and CKD 3 with baseline creatinine around 1.3-1.5 who was referred to the ED via EMS by one of his caregivers for evaluation of increased lethargy, mental status changes, and cough. The patient is nonambulatory at baseline and spends most of his day sitting up a recliner. He has caregiver support 24 hours/day. He has had cough for 4-5 days. He has had two known sick contacts (son-in-law and caregiver). He has had yellow sputum. No chest pain or shortness of breath. No nausea or vomiting/diarrhea. No abdominal pain. No dysuria/hematuria On 06/17/16, he was noted to have increased lethargy and muffled speech with slurring. No other focal weakenss. 911 was called and the patient was transferred to the ED for evaluation. No recent hospitalizations. No recent antibiotics. He has been using OTC mucinex for his symptoms.  Assessment/Plan: Sepsis secondary to MSSA/Enterococcus bacteremia/MSSA Right prosthetic knee infection -Secondary to bacteremia and prosthetic knee infection -Presented with fever, tachycardia, tachypnea, leukocytosis -Patient also had elevated pro calcitonin level as well as lactic acid -Initially placed on vancomycin and zosyn, and transitioned to Unasyn -Chest x-ray, UA unremarkable for infection  -CT chest showed subpleural fibrosis, no acute pulmonary infiltrates. -Influenza PCR, strep pneumonia urine antigen negative -Blood cultures 06/17/2016: MSSA, enterococcus  -sepsis physiology resolved  MSSA/Enterococcus bacteremia/MSSA Right prosthetic knee infection -Blood cultures 06/17/2016: MSSA, E faecalis -Repeat blood  cultures 06/19/16 no growth to date -Echocardiogram (TTE) showed EF 55-60%, moderate AS, trivial MR/TR/AI -Infectious disease consulted and appreciated, recommended TEE and EP consult -Cardiology consulted and appreciated- TEE attempted multiple times on 06/22/16- may need repeat attempt with general anesthesia vs long term IV antibiotics -Per cardiology, patient is at high risk of complication due to high risk comorbidities. No plan for device extraction at this time. -Orthopedic surgery consulted and appreciated for infected knee. S/p aspiration x2-->grew MSSA -Patient seen by Dr. Mayer Camel (ortho), patient would need an AKA, however patient seen as a poor surgical candidate. Ortho has discussed this with the patient's son. If fluid re-accumulates, consideration for AKA. Continue IV antibiotics -Discussed case with infectious disease. Patient will need IV unasyn for 6 weeks, and followed by PO amox/clav for suppression thereafter. -06/22/16 PICC line -Cardio and ortho both recommend palliative care consult.  Right and Left lower extremity DVT -clinically provoked, but son states that pt has had 2 or 3 previous episodes of DVTs in past -son unclear why pt is not on chronic anticoagulation -LE doppler--acute DVT Right common femoral, femoral, and popliteal veins and Left common femoral and femoral vein -started IV heparin -transition to po apixaban on 06/26/16 if no further plans for any surgical or invasive interventions  Dysphagia -Speech consulted and recommended FULL liquids due to abnormal MBS results -will ask speech to re-evaluation again as pt is near optimal for discharge -son states pt was on regular diet prior to admission but did require food to be chopped up -discussed with son--he does not want to restrict diet, but does not seem to full accept or fully understand the risk of aspiration--"he's always eaten solid food without problems"  Acute on Chronic kidney disease, stage  III -Creatinine baseline at 1.2-1.5 -Serum creatinine peaked  2.03 -Continue to monitor BMP  Dermatophytosis/tinea cruris -Groin area appears to be erythematous and flaky -discontinue diflucan--pt finished one week -continue mycostatin topical -suspect pt has component of incontinence dermatitis  Essential hypertension -Currently on no home medications, continue IV hydralazine PRN  Diabetes mellitus, type II -Currently on no home medications -Continue insulin sliding scale, Levemir, CBG monitoring -check A1C -add novolog 4 units with meals -increase levemir to 6 units bid  History of atrial fibrillation -CHADSVASC 5 (based on age, DM, HTN, CAD) -previously not on [facility] -Status post pacemaker placement- cardiology consulted as patient does have bacteremia, as above, no plan for device extraction at this time.   Stage II pressure injury -moisture related -wound care consulted and appreciated  Moderate aortic stenosis -Noted on echocardiogram  Cognitive impairment -Per son patient can sometimes be clear minded but is not always  Hypokalemia -K 3.0, continue to replete -Monitor BMP -mag= 1.8  Hypomagnesemia -Magnesium 1.8, continue to monitor and replace as needed  Goals of care -Had discuss with son via phone regarding patient, current treatment and plan, code status, mental status. Per son, patient's mental status is not always clear and at this time he would like to remain full code and possible have surgeries that might be needed. -Cardio and ortho recommend palliative care consult. Discussed with son, does not feel palliative care consult is needed right now.  DVT Prophylaxislovenox  Code Status:Full  Family Communication:updated son on phone 5/10  Disposition Plan: Home 5/12 if cleared by ortho--son refuses SNF -Total time spent 35 minutes.  Greater than 50% spent face to face counseling and coordinating care.   Consultants Infectious  disease Cardiology Orthopedics  Procedures  Echocardiogram Right knee aspiration x2 Attempted TEE  Antibiotics vanco 5/2>>> Unasyn 5/7>>> Nafcillin 5/3>>5/7    Subjective: Patient denies fevers, chills, headache, chest pain, dyspnea, nausea, vomiting, diarrhea, abdominal pain, dysuria, hematuria, hematochezia, and melena.   Objective: Vitals:   06/24/16 1551 06/24/16 2210 06/25/16 0551 06/25/16 1318  BP: 131/72 (!) 100/42 (!) 118/45 (!) 141/68  Pulse: 79 66 67 (!) 46  Resp: 17 18 20 18   Temp: 98.8 F (37.1 C) 98.2 F (36.8 C) 98.2 F (36.8 C) 98.5 F (36.9 C)  TempSrc: Oral Oral Oral Oral  SpO2: 100% 99% 99% 97%  Weight:      Height:        Intake/Output Summary (Last 24 hours) at 06/25/16 1541 Last data filed at 06/25/16 1320  Gross per 24 hour  Intake           1150.8 ml  Output             2200 ml  Net          -1049.2 ml   Weight change:  Exam:   General:  Pt is alert, follows commands appropriately, not in acute distress  HEENT: No icterus, No thrush, No neck mass, [place]/AT  Cardiovascular: RRR, S1/S2, no rubs, no gallops  Respiratory: Bibasilar crackles. No wheeze. Good air movement.  Abdomen: Soft/+BS, non tender, non distended, no guarding  Extremities: 2+ LE edema, No lymphangitis, No petechiae, No rashes, no synovitis; small right knee effusion   Data Reviewed: I have personally reviewed following labs and imaging studies Basic Metabolic Panel:  Recent Labs Lab 06/21/16 0334 06/22/16 0352 06/23/16 0331 06/23/16 0349 06/24/16 0348 06/25/16 0435  NA 141 139 139  --  138 138  K 3.0* 3.2* 3.0*  --  3.6 3.4*  CL 114* 111 112*  --  110 110  CO2 19* 20* 21*  --  21* 22  GLUCOSE 164* 123* 119*  --  185* 140*  BUN 21* 18 16  --  17 15  CREATININE 1.79* 1.83* 1.89*  --  1.69* 1.71*  CALCIUM 7.2* 7.2* 7.5*  --  7.8* 7.7*  MG  --  1.6*  --  1.9  --  1.8  PHOS  --  2.0*  --   --   --   --    Liver Function Tests: No results for  input(s): AST, ALT, ALKPHOS, BILITOT, PROT, ALBUMIN in the last 168 hours. No results for input(s): LIPASE, AMYLASE in the last 168 hours. No results for input(s): AMMONIA in the last 168 hours. Coagulation Profile: No results for input(s): INR, PROTIME in the last 168 hours. CBC:  Recent Labs Lab 06/20/16 0351 06/21/16 0334 06/22/16 0352 06/23/16 0331 06/24/16 0348  WBC 14.5* 11.5* 10.6* 11.2* 12.2*  HGB 11.9* 12.0* 11.3* 11.1* 10.1*  HCT 37.1* 36.9* 34.8* 33.9* 30.5*  MCV 86.3 85.2 85.1 84.3 83.8  PLT 279 291 305 350 404*   Cardiac Enzymes: No results for input(s): CKTOTAL, CKMB, CKMBINDEX, TROPONINI in the last 168 hours. BNP: Invalid input(s): POCBNP CBG:  Recent Labs Lab 06/24/16 1140 06/24/16 1811 06/24/16 2208 06/25/16 0720 06/25/16 1131  GLUCAP 271* 151* 117* 135* 238*   HbA1C: No results for input(s): HGBA1C in the last 72 hours. Urine analysis:    Component Value Date/Time   COLORURINE YELLOW 06/17/2016 1959   APPEARANCEUR CLEAR 06/17/2016 1959   LABSPEC 1.020 06/17/2016 1959   PHURINE 5.0 06/17/2016 1959   GLUCOSEU >=500 (A) 06/17/2016 1959   HGBUR SMALL (A) 06/17/2016 1959   BILIRUBINUR NEGATIVE 06/17/2016 1959   KETONESUR 5 (A) 06/17/2016 1959   PROTEINUR 30 (A) 06/17/2016 1959   UROBILINOGEN 0.2 05/18/2014 1634   NITRITE NEGATIVE 06/17/2016 1959   LEUKOCYTESUR NEGATIVE 06/17/2016 1959   Sepsis Labs: @LABRCNTIP (procalcitonin:4,lacticidven:4) ) Recent Results (from the past 240 hour(s))  Blood Culture (routine x 2)     Status: Abnormal   Collection Time: 06/17/16  7:52 PM  Result Value Ref Range Status   Specimen Description BLOOD BLOOD LEFT FOREARM  Final   Special Requests IN PEDIATRIC BOTTLE Blood Culture adequate volume  Final   Culture  Setup Time   Final    GRAM POSITIVE COCCI IN CLUSTERS IN PEDIATRIC BOTTLE CRITICAL VALUE NOTED.  VALUE IS CONSISTENT WITH PREVIOUSLY REPORTED AND CALLED VALUE.    Culture (A)  Final    STAPHYLOCOCCUS  SPECIES (COAGULASE NEGATIVE) THE SIGNIFICANCE OF ISOLATING THIS ORGANISM FROM A SINGLE SET OF BLOOD CULTURES WHEN MULTIPLE SETS ARE DRAWN IS UNCERTAIN. PLEASE NOTIFY THE MICROBIOLOGY DEPARTMENT WITHIN ONE WEEK IF SPECIATION AND SENSITIVITIES ARE REQUIRED. Performed at [facility], [place] [address], [place], [place] [address]    Report Status 06/19/2016 FINAL  Final  Blood Culture (routine x 2)     Status: Abnormal   Collection Time: 06/17/16  8:12 PM  Result Value Ref Range Status   Specimen Description BLOOD RIGHT ANTECUBITAL  Final   Special Requests   Final    BOTTLES DRAWN AEROBIC AND ANAEROBIC Blood Culture results may not be optimal due to an inadequate volume of blood received in culture bottles   Culture  Setup Time   Final    GRAM POSITIVE COCCI IN CHAINS IN BOTH AEROBIC AND ANAEROBIC BOTTLES CRITICAL RESULT CALLED TO, READ BACK BY AND VERIFIED WITH: N. Glogovac Pharm.D. 15:45  06/18/16 ([email or web address]) Performed at [facility], [place] [address] [place], [place] [address]    Culture STAPHYLOCOCCUS AUREUS ENTEROCOCCUS FAECALIS  (A)  Final   Report Status 06/21/2016 FINAL  Final   Organism ID, Bacteria STAPHYLOCOCCUS AUREUS  Final   Organism ID, Bacteria ENTEROCOCCUS FAECALIS  Final      Susceptibility   Enterococcus faecalis - MIC*    AMPICILLIN <=2 SENSITIVE Sensitive     VANCOMYCIN <=0.5 SENSITIVE Sensitive     GENTAMICIN SYNERGY SENSITIVE Sensitive     * ENTEROCOCCUS FAECALIS   Staphylococcus aureus - MIC*    CIPROFLOXACIN <=0.5 SENSITIVE Sensitive     ERYTHROMYCIN <=0.25 SENSITIVE Sensitive     GENTAMICIN <=0.5 SENSITIVE Sensitive     OXACILLIN <=0.25 SENSITIVE Sensitive     TETRACYCLINE <=1 SENSITIVE Sensitive     VANCOMYCIN <=0.5 SENSITIVE Sensitive     TRIMETH/SULFA <=10 SENSITIVE Sensitive     CLINDAMYCIN <=0.25 SENSITIVE Sensitive     RIFAMPIN <=0.5 SENSITIVE Sensitive     Inducible Clindamycin NEGATIVE Sensitive     * STAPHYLOCOCCUS AUREUS  Blood Culture ID  Panel (Reflexed)     Status: Abnormal   Collection Time: 06/17/16  8:12 PM  Result Value Ref Range Status   Enterococcus species DETECTED (A) NOT DETECTED Final    Comment: CRITICAL RESULT CALLED TO, READ BACK BY AND VERIFIED WITH: N. Glogovac Pharm.D. 15:45 06/18/16 ([email or web address])    Vancomycin resistance NOT DETECTED NOT DETECTED Final   Listeria monocytogenes NOT DETECTED NOT DETECTED Final   Staphylococcus species DETECTED (A) NOT DETECTED Final    Comment: CRITICAL RESULT CALLED TO, READ BACK BY AND VERIFIED WITH: N. Glogovac Pharm.D. 15:45 06/18/16 ([email or web address])    Staphylococcus aureus DETECTED (A) NOT DETECTED Final    Comment: Methicillin (oxacillin) susceptible Staphylococcus aureus (MSSA). Preferred therapy is anti staphylococcal beta lactam antibiotic (Cefazolin or Nafcillin), unless clinically contraindicated. CRITICAL RESULT CALLED TO, READ BACK BY AND VERIFIED WITH: N. Glogovac Pharm.D. 15:45 06/18/16 ([email or web address])    Methicillin resistance NOT DETECTED NOT DETECTED Final   Streptococcus species NOT DETECTED NOT DETECTED Final   Streptococcus agalactiae NOT DETECTED NOT DETECTED Final   Streptococcus pneumoniae NOT DETECTED NOT DETECTED Final   Streptococcus pyogenes NOT DETECTED NOT DETECTED Final   Acinetobacter baumannii NOT DETECTED NOT DETECTED Final   Enterobacteriaceae species NOT DETECTED NOT DETECTED Final   Enterobacter cloacae complex NOT DETECTED NOT DETECTED Final   Escherichia coli NOT DETECTED NOT DETECTED Final   Klebsiella oxytoca NOT DETECTED NOT DETECTED Final   Klebsiella pneumoniae NOT DETECTED NOT DETECTED Final   Proteus species NOT DETECTED NOT DETECTED Final   Serratia marcescens NOT DETECTED NOT DETECTED Final   Haemophilus influenzae NOT DETECTED NOT DETECTED Final   Neisseria meningitidis NOT DETECTED NOT DETECTED Final   Pseudomonas aeruginosa NOT DETECTED NOT DETECTED Final   Candida albicans NOT DETECTED NOT DETECTED Final   Candida glabrata NOT DETECTED  NOT DETECTED Final   Candida krusei NOT DETECTED NOT DETECTED Final   Candida parapsilosis NOT DETECTED NOT DETECTED Final   Candida tropicalis NOT DETECTED NOT DETECTED Final    Comment: Performed at [facility], [address], [place], [place] [address]  MRSA PCR Screening     Status: Abnormal   Collection Time: 06/18/16  1:40 AM  Result Value Ref Range Status   MRSA by PCR POSITIVE (A) NEGATIVE Final    Comment:        The GeneXpert MRSA Assay (FDA approved  for NASAL specimens only), is one component of a comprehensive MRSA colonization surveillance program. It is not intended to diagnose MRSA infection nor to guide or monitor treatment for MRSA infections. RESULT CALLED TO, READ BACK BY AND VERIFIED WITH: C.SMITH,RN [id number] 06/18/16 W.SHEA   Culture, respiratory (NON-Expectorated)     Status: None   Collection Time: 06/18/16  6:32 AM  Result Value Ref Range Status   Specimen Description TRACHEAL ASPIRATE  Final   Special Requests NONE  Final   Gram Stain   Final    ABUNDANT WBC PRESENT, PREDOMINANTLY PMN RARE SQUAMOUS EPITHELIAL CELLS PRESENT MODERATE GRAM POSITIVE RODS GRAM POSITIVE COCCI IN PAIRS    Culture   Final    Consistent with normal respiratory flora. Performed at [facility], [place] [address], [place], [place] [address]    Report Status 06/20/2016 FINAL  Final  Culture, blood (Routine X 2) w Reflex to ID Panel     Status: None   Collection Time: 06/18/16  6:16 PM  Result Value Ref Range Status   Specimen Description BLOOD RIGHT ARM  Final   Special Requests IN PEDIATRIC BOTTLE Blood Culture adequate volume  Final   Culture   Final    NO GROWTH 5 DAYS Performed at [facility], [place] [address] [place], [place] [address]    Report Status 06/23/2016 FINAL  Final  Culture, blood (Routine X 2) w Reflex to ID Panel     Status: Abnormal   Collection Time: 06/18/16  6:22 PM  Result Value Ref Range Status   Specimen Description BLOOD RIGHT  ANTECUBITAL  Final   Special Requests IN PEDIATRIC BOTTLE Blood Culture adequate volume  Final   Culture  Setup Time   Final    GRAM POSITIVE COCCI IN CLUSTERS IN PEDIATRIC BOTTLE CRITICAL RESULT CALLED TO, READ BACK BY AND VERIFIED WITH: D ZEIGLER PHARMD 1932 06/19/16 A BROWNING    Culture (A)  Final    STAPHYLOCOCCUS AUREUS SUSCEPTIBILITIES PERFORMED ON PREVIOUS CULTURE WITHIN THE LAST 5 DAYS. Performed at [facility], [place] [address], [place], [place] [address]    Report Status 06/21/2016 FINAL  Final  Blood Culture ID Panel (Reflexed)     Status: Abnormal   Collection Time: 06/18/16  6:22 PM  Result Value Ref Range Status   Enterococcus species NOT DETECTED NOT DETECTED Final   Listeria monocytogenes NOT DETECTED NOT DETECTED Final   Staphylococcus species DETECTED (A) NOT DETECTED Final    Comment: CRITICAL RESULT CALLED TO, READ BACK BY AND VERIFIED WITHMarcy Siren PHARMD 1932 06/19/16 A BROWNING    Staphylococcus aureus DETECTED (A) NOT DETECTED Final    Comment: Methicillin (oxacillin) susceptible Staphylococcus aureus (MSSA). Preferred therapy is anti staphylococcal beta lactam antibiotic (Cefazolin or Nafcillin), unless clinically contraindicated. CRITICAL RESULT CALLED TO, READ BACK BY AND VERIFIED WITHMarcy Siren PHARMD 1932 06/19/16 A BROWNING    Methicillin resistance NOT DETECTED NOT DETECTED Final   Streptococcus species NOT DETECTED NOT DETECTED Final   Streptococcus agalactiae NOT DETECTED NOT DETECTED Final   Streptococcus pneumoniae NOT DETECTED NOT DETECTED Final   Streptococcus pyogenes NOT DETECTED NOT DETECTED Final   Acinetobacter baumannii NOT DETECTED NOT DETECTED Final   Enterobacteriaceae species NOT DETECTED NOT DETECTED Final   Enterobacter cloacae complex NOT DETECTED NOT DETECTED Final   Escherichia coli NOT DETECTED NOT DETECTED Final   Klebsiella oxytoca NOT DETECTED NOT DETECTED Final   Klebsiella pneumoniae NOT DETECTED NOT DETECTED Final  Proteus species NOT DETECTED NOT DETECTED Final   Serratia marcescens NOT DETECTED NOT DETECTED Final   Haemophilus influenzae NOT DETECTED NOT DETECTED Final   Neisseria meningitidis NOT DETECTED NOT DETECTED Final   Pseudomonas aeruginosa NOT DETECTED NOT DETECTED Final   Candida albicans NOT DETECTED NOT DETECTED Final   Candida glabrata NOT DETECTED NOT DETECTED Final   Candida krusei NOT DETECTED NOT DETECTED Final   Candida parapsilosis NOT DETECTED NOT DETECTED Final   Candida tropicalis NOT DETECTED NOT DETECTED Final    Comment: Performed at [facility], [place] [address], [place], [place] [address]  Culture, blood (Routine X 2) w Reflex to ID Panel     Status: None   Collection Time: 06/19/16 11:08 AM  Result Value Ref Range Status   Specimen Description BLOOD RIGHT ARM  Final   Special Requests IN PEDIATRIC BOTTLE Blood Culture adequate volume  Final   Culture   Final    NO GROWTH 5 DAYS Performed at [facility], [place] [address] [place], [place] [address]    Report Status 06/24/2016 FINAL  Final  Culture, blood (Routine X 2) w Reflex to ID Panel     Status: None   Collection Time: 06/19/16 11:08 AM  Result Value Ref Range Status   Specimen Description BLOOD RIGHT HAND  Final   Special Requests IN PEDIATRIC BOTTLE Blood Culture adequate volume  Final   Culture   Final    NO GROWTH 5 DAYS Performed at [facility], [place] [address], [place], [place] [address]    Report Status 06/24/2016 FINAL  Final  Body fluid culture     Status: None   Collection Time: 06/19/16 12:25 PM  Result Value Ref Range Status   Specimen Description SYNOVIAL RIGHT KNEE  Final   Special Requests NONE  Final   Gram Stain   Final    MODERATE WBC PRESENT,BOTH PMN AND MONONUCLEAR FEW GRAM POSITIVE COCCI IN PAIRS    Culture   Final    MODERATE STAPHYLOCOCCUS AUREUS NO ANAEROBES ISOLATED Performed at [facility], [place] [address], [place], [place] [address]    Report Status  06/22/2016 FINAL  Final   Organism ID, Bacteria STAPHYLOCOCCUS AUREUS  Final      Susceptibility   Staphylococcus aureus - MIC*    CIPROFLOXACIN <=0.5 SENSITIVE Sensitive     ERYTHROMYCIN <=0.25 SENSITIVE Sensitive     GENTAMICIN <=0.5 SENSITIVE Sensitive     OXACILLIN <=0.25 SENSITIVE Sensitive     TETRACYCLINE <=1 SENSITIVE Sensitive     VANCOMYCIN <=0.5 SENSITIVE Sensitive     TRIMETH/SULFA <=10 SENSITIVE Sensitive     CLINDAMYCIN <=0.25 SENSITIVE Sensitive     RIFAMPIN <=0.5 SENSITIVE Sensitive     Inducible Clindamycin NEGATIVE Sensitive     * MODERATE STAPHYLOCOCCUS AUREUS     Scheduled Meds: . chlorhexidine  15 mL Mouth Rinse BID  . Chlorhexidine Gluconate Cloth  6 each Topical Daily  . feeding supplement  1 Container Oral BID BM  . fluconazole  100 mg Oral Daily  . Gerhardt's butt cream   Topical TID  . insulin aspart  0-20 Units Subcutaneous TID WC  . insulin aspart  0-5 Units Subcutaneous QHS  . insulin aspart  4 Units Subcutaneous TID WC  . insulin detemir  6 Units Subcutaneous BID  . mouth rinse  15 mL Mouth Rinse q12n4p  . sodium chloride flush  10-40 mL Intracatheter Q12H   Continuous Infusions: .  sodium chloride 10 mL/hr at 06/24/16 0600  . sodium chloride    . ampicillin-sulbactam (UNASYN) IV Stopped (06/25/16 1020)  . heparin 1,450 Units/hr (06/25/16 0709)    Procedures/Studies: Dg Chest 2 View  Result Date: 06/17/2016 CLINICAL DATA:  Lethargy and cough EXAM: CHEST  2 VIEW COMPARISON:  04/23/2014 FINDINGS: Cardiac shadow is stable. Pacing device is again noted. The lungs are well aerated bilaterally. No bony abnormality is noted. IMPRESSION: No active cardiopulmonary disease. Electronically Signed   By: Inez Catalina M.D.   On: 06/17/2016 20:38   Ct Chest Wo Contrast  Result Date: 06/18/2016 CLINICAL DATA:  Lethargy and cough for 2 days EXAM: CT CHEST WITHOUT CONTRAST TECHNIQUE: Multidetector CT imaging of the chest was performed following the standard  protocol without IV contrast. COMPARISON:  06/17/2016, CT 04/17/2016 FINDINGS: Cardiovascular: Limited evaluation without intravenous contrast. Aortic atherosclerosis. No aneurysmal dilatation. Coronary artery calcification. Partially visualized cardiac pacing leads. Mild cardiomegaly. No large pericardial effusion. Mediastinum/Nodes: Midline trachea. Few prominent sub- carinal and periesophageal lymph nodes, measuring up to 13 mm. Esophagus within normal limits. Esophagus Lungs/Pleura: Mild subpleural fibrosis. No focal consolidation, pleural effusion, or pneumothorax. Upper Abdomen: No acute abnormality. 9.6 cm cyst in the mid to upper right kidney. Vague hypodensity in the upper pole of the left kidney also consistent with a cyst Musculoskeletal: Degenerative changes of the spine. No acute or suspicious bone lesion. IMPRESSION: 1. Mild subpleural fibrosis. No acute pulmonary infiltrates are visualized. 2. Mild nonspecific mediastinal adenopathy 3. Cardiomegaly 4. Bilateral kidney cysts, measuring up to 9.6 cm on the right. Electronically Signed   By: Donavan Foil M.D.   On: 06/18/2016 00:26   Dg Chest Port 1 View  Result Date: 06/23/2016 CLINICAL DATA:  Central line placement. EXAM: PORTABLE CHEST 1 VIEW COMPARISON:  06/17/2016 FINDINGS: Right arm PICC has its tip in the proximal right atrium. Withdrawal of 2 cm if this is desired to be above the right atrium. Pacemaker leads appear the same. Cardiomegaly. Chronic pulmonary scarring. IMPRESSION: Right arm PICC tip in the right atrium. Withdraw 2 cm to be at the SVC RA junction, if that is desired. Electronically Signed   By: Nelson Chimes M.D.   On: 06/23/2016 15:56   Dg Swallowing Func-speech Pathology  Result Date: 06/19/2016 Objective Swallowing Evaluation: Type of Study: MBS-Modified Barium Swallow Study Patient Details Name: Randy [name] MRN: [id number] Date of Birth: 08/01/1924 Today's Date: 06/19/2016 Time: SLP Start Time (ACUTE ONLY): 1520-SLP Stop  Time (ACUTE ONLY): 1545 SLP Time Calculation (min) (ACUTE ONLY): 25 min Past Medical History: Past Medical History: Diagnosis Date . Anxiety  . Arthritis   R knee,  . CKD (chronic kidney disease)  . Coronary artery disease  . Diabetes mellitus without complication ([place])  . Dysrhythmia   PAF;,sss s/p St. Jude PPM . Hyperlipidemia  . Hypertension  . MI (myocardial infarction) ([place]) 2014 . Presence of permanent cardiac pacemaker  Past Surgical History: Past Surgical History: Procedure Laterality Date . CHOLECYSTECTOMY   . CORONARY ANGIOPLASTY    mLAD '13, RCA DES 03/2013 . EXCISIONAL TOTAL KNEE ARTHROPLASTY WITH ANTIBIOTIC SPACERS Right 04/20/2014  Procedure: IRRIGATION AND DEBRIDMENT RIGHT TOTAL KNEE REMOVE  ACL IMPLANTED AND PLACE SPACER;  Surgeon: [name], MD;  Location: [facility];  Service: Orthopedics;  Laterality: Right; . HEMORROIDECTOMY   . KNEE SURGERY  2006 . PACEMAKER INSERTION   . PICC LINE PLACE PERIPHERAL ([place] HX)  04/2014  R upper arm  . TOTAL KNEE ARTHROPLASTY WITH REVISION COMPONENTS  Right 06/11/2014  Procedure: TOTAL KNEE ARTHROPLASTY WITH REVISION COMPONENTS REMOVE SPACER PLACE TKA;  Surgeon: [name], MD;  Location: [facility];  Service: Orthopedics;  Laterality: Right; HPI: pt admit to [facility] with AMS, cough- found to be bacterial septic.  PMH + for DM, MI, no CVA hx. CT chest and CXR negative.  Swallow eval ordered due to pt having dysphagia.  Subjective: pt awake in chair Assessment / Plan / Recommendation CHL IP CLINICAL IMPRESSIONS 06/19/2016 Clinical Impression Pt presents with severe pharyngeal dysphagia - narrow pharynx and decreased tongue base retraction results in very poor epiglottic deflection and gross pharyngeal/vallecular residuals.  Pt requires multiple swallows (up to 5-6) with each bolus and still has vallecular residuals. He did not aspirate or penetrate surprisingly.  Residuals were much worse with solids/pudding than liquids.  Various strategies/postures including head turn left, multiple  swallows, following solid with liquids mitigate dysphagia.  Pt did not fully clear vallecular space but can fortunately hock and expectorate to clear.  Pt reports his swallow ability during MBS is "normal" for him causing SLP to suspect baseline significant dysphagia that he has been managing.  Recommend strict precautions to mitigate aspiration risk and modify diet to full liquids. Using teach back, pt able to demonstrate multiple swallows, "hocking" and oral suctioning independently.  Will follow for po tolerance, readiness for dietary advancement.   SLP Visit Diagnosis Dysphagia, oropharyngeal phase (R13.12) Attention and concentration deficit following -- Frontal lobe and executive function deficit following -- Impact on safety and function Severe aspiration risk;Risk for inadequate nutrition/hydration   CHL IP TREATMENT RECOMMENDATION 06/19/2016 Treatment Recommendations Therapy as outlined in treatment plan below   Prognosis 06/19/2016 Prognosis for Safe Diet Advancement Fair Barriers to Reach Goals Time post onset Barriers/Prognosis Comment -- CHL IP DIET RECOMMENDATION 06/19/2016 SLP Diet Recommendations Nectar thick liquid;Thin liquid Liquid Administration via Cup;Straw Medication Administration (No Data) Compensations Slow rate;Small sips/bites;Follow solids with liquid;Multiple dry swallows after each bite/sip Postural Changes Seated upright at 90 degrees;Remain semi-upright after after feeds/meals (Comment)   CHL IP OTHER RECOMMENDATIONS 06/19/2016 Recommended Consults -- Oral Care Recommendations Oral care before and after PO Other Recommendations --   CHL IP FOLLOW UP RECOMMENDATIONS 06/19/2016 Follow up Recommendations (No Data)   CHL IP FREQUENCY AND DURATION 06/19/2016 Speech Therapy Frequency (ACUTE ONLY) min 2x/week Treatment Duration 1 week      CHL IP ORAL PHASE 06/19/2016 Oral Phase WFL Oral - Pudding Teaspoon -- Oral - Pudding Cup -- Oral - Honey Teaspoon -- Oral - Honey Cup -- Oral - Nectar Teaspoon --  Oral - Nectar Cup WFL Oral - Nectar Straw WFL Oral - Thin Teaspoon -- Oral - Thin Cup WFL Oral - Thin Straw WFL Oral - Puree WFL Oral - Mech Soft WFL Oral - Regular WFL Oral - Multi-Consistency -- Oral - Pill -- Oral Phase - Comment --  CHL IP PHARYNGEAL PHASE 06/19/2016 Pharyngeal Phase Impaired Pharyngeal- Pudding Teaspoon -- Pharyngeal -- Pharyngeal- Pudding Cup -- Pharyngeal -- Pharyngeal- Honey Teaspoon -- Pharyngeal -- Pharyngeal- Honey Cup -- Pharyngeal -- Pharyngeal- Nectar Teaspoon -- Pharyngeal -- Pharyngeal- Nectar Cup Reduced pharyngeal peristalsis;Pharyngeal residue - valleculae;Reduced tongue base retraction Pharyngeal -- Pharyngeal- Nectar Straw Reduced pharyngeal peristalsis;Reduced tongue base retraction;Pharyngeal residue - valleculae Pharyngeal -- Pharyngeal- Thin Teaspoon -- Pharyngeal -- Pharyngeal- Thin Cup Reduced epiglottic inversion;Reduced tongue base retraction;Reduced pharyngeal peristalsis Pharyngeal -- Pharyngeal- Thin Straw Reduced pharyngeal peristalsis;Reduced epiglottic inversion;Pharyngeal residue - valleculae Pharyngeal -- Pharyngeal- Puree Reduced epiglottic inversion;Reduced pharyngeal peristalsis;Pharyngeal residue - valleculae  Pharyngeal -- Pharyngeal- Mechanical Soft -- Pharyngeal -- Pharyngeal- Regular Reduced pharyngeal peristalsis;Reduced epiglottic inversion;Reduced tongue base retraction;Pharyngeal residue - valleculae Pharyngeal -- Pharyngeal- Multi-consistency -- Pharyngeal -- Pharyngeal- Pill -- Pharyngeal -- Pharyngeal Comment pt without awareness to mild leftover vallecular residuals, he is able to clear/expectorate with cues   CHL IP CERVICAL ESOPHAGEAL PHASE 06/19/2016 Cervical Esophageal Phase WFL Pudding Teaspoon -- Pudding Cup -- Honey Teaspoon -- Honey Cup -- Nectar Teaspoon -- Nectar Cup -- Nectar Straw -- Thin Teaspoon -- Thin Cup -- Thin Straw -- Puree -- Mechanical Soft -- Regular -- Multi-consistency -- Pill -- Cervical Esophageal Comment -- No flowsheet data  found. Luanna Salk, MS [facility] SLP [phone]               Velma Agnes, DO  [facility] Pager [phone]  If 7PM-7AM, please contact night-coverage [email or web address] Password [email or web address] 06/25/2016, 3:41 PM   LOS: 8 days

## 2016-06-25 NOTE — Progress Notes (Signed)
Initial Nutrition Assessment  DOCUMENTATION CODES:   Not applicable  INTERVENTION:  - Continue Boost Breeze BID. - Continue Magic Cup BID. - Recommend diet advancement. - RD will continue to monitor for additional nutrition-related needs.  NUTRITION DIAGNOSIS:   Unintentional weight loss related to acute illness as evidenced by percent weight loss. -ongoing  GOAL:   Patient will meet greater than or equal to 90% of their needs -unmet at this time  MONITOR:   PO intake, Supplement acceptance, Diet advancement, Weight trends, Labs, Skin, I & O's  ASSESSMENT:   83 y.o. gentleman with a history of mild dementia/cognitive impairment, CAD with prior stent placement, history of atrial fibrillation S/P PPM implant (CHADS-Vasc score of at least four, no anticoagulation), HTN, HLD, DM, and CKD 3 with baseline creatinine around 1.3-1.5 who was referred to the ED via EMS by one of his caregivers for evaluation of increased lethargy, mental status changes, and cough.  The patient is nonambulatory at baseline and spends most of his day sitting up a recliner.  He has caregiver support 24 hours/day.  He has had cough for 4-5 days.  He has had two known sick contacts (son-in-law and caregiver).   5/10 Per chart review, pt consumed 40% of breakfast yesterday (~230 kcal and 3 grams of protein) and 100% of breakfast this AM (~520 kcal and 9 grams of protein). No intakes documented from lunch or dinner yesterday.  Weight + 2.6 kg from 5/5-5/8 and no new weight since that time. Reviewed Ortho MD note from this AM which states possible need for perc drain to R knee but that pt is not a candidate for AKA d/t unlikely to survive surgery and recommendation for Palliative Care. Will continue to monitor POC/GOC.  Medications reviewed; sliding scale Novolog, 4 units Novolog TID, 6 units Levemir BID.  Labs reviewed; CBG: 135 mg/dL today, K: 3.4 mmol/L, creatinine: 1.71 mg/dL, Ca: 7.7 mg/dL, GFR: 33 mL/min.      5/7 - Pt currently on FLD, thin liquids.  - He was at [facility] this AM for TEE and was NPO since midnight for the same.  - RN ordered lunch tray for pt ~20 minutes ago.  - Pt with some confusion throughout conversation but mainly answers seem appropriate.  - No family/visitors present at this time.  - No intakes documented since previous assessment and pt unable to recall with certainty if he has been eating meals/eating well at meals.  - He usually has a good appetite and that he cooks for himself at home. - He denies any abdominal pain or nausea.  - RN provided pt with a Boost Breeze shortly before RD visit; pt consumed 100% of this supplement and is interested in continuing to receive it.  - Weight + 3.6 kg from 5/3-5/5.  - Physical assessment done during this visit and shows moderated edema to BLE with no muscle and no fat wasting. - Dr. Aldine Contes note from this AM states pt to possibly have AKA.   K: 3.2 mmol/L, Phos: 2 mg/dL, Mg: 1.6 mg/dL.   5/4 - Spoke with SLP who reports that pt likely with chronic dysphagia. - This RD had ordered Ensure Enlive BID yesterday after discussion with RN.  - Will d/c Ensure and will order Magic Cup BID with lunch and dinner trays, each supplement provides 290 kcal and 9 grams of protein.  5/3 - No intakes documented since admission.  - Pt sleeping at this time after RNs in to patients room x3 over  the past half hour.  - Spoke with RN who reports no family/visitors have been present today.  - Pt has been very lethargic throughout the day and responds "yes" to all questions asked. - He has been too lethargic to attempt to feed but she is going to try to give him something for dinner, if possible. - Unable to complete physical assessment at this time.  - Per chart review, pt has lost 12 lbs (5.7% body weight) in the past 2 months which is significant for time frame.    Diet Order:  Diet full liquid Room service appropriate? Yes; Fluid  consistency: Thin  Skin:  Reviewed, no issues (groin and sacral MASD)  Last BM:  5/10  Height:   Ht Readings from Last 1 Encounters:  06/18/16 5\' 11"  (1.803 m)    Weight:   Wt Readings from Last 1 Encounters:  06/23/16 212 lb 4.9 oz (96.3 kg)    Ideal Body Weight:  78.18 kg  BMI:  Body mass index is 29.61 kg/m.  Estimated Nutritional Needs:   Kcal:  1620-1805 (18-20 kcal/kg)  Protein:  75-90 grams  Fluid:  1.8-2 L/day  EDUCATION NEEDS:   No education needs identified at this time    Jarome Matin, MS, RD, LDN, CNSC Inpatient Clinical Dietitian Pager # [phone] After hours/weekend pager # [phone]

## 2016-06-25 NOTE — Evaluation (Signed)
Occupational Therapy Evaluation Patient Details Name: Randy [name] MRN: [id number] DOB: 1924-03-02 Today's Date: 06/25/2016    History of Present Illness Randy [name] is a 83 y.o. gentleman with a history of mild dementia/cognitive impairment, CAD with prior stent placement, history of atrial fibrillation S/P PPM implant  HTN, HLD, DM, and CKD ,infected right TKA(multiple surgeries.) was referred to the ED via EMS by one of his caregivers for evaluation of increased lethargy, mental status changes, and cough.  TPer historypatient is nonambulatory at baseline and spends most of his day sitting up a recliner. Aspiration of the  right knee performed.   Clinical Impression   Pt admitted with lethary and mental status changes. Pt currently with functional limitations due to the deficits listed below (see OT Problem List). Pt will benefit from skilled OT to increase their safety and independence with ADL and functional mobility for ADL to facilitate discharge to venue listed below.      Follow Up Recommendations  SNF;Home health OT;Supervision/Assistance - 24 hour    Equipment Recommendations  None recommended by OT    Recommendations for Other Services       Precautions / Restrictions Precautions Precautions: Fall Precaution Comments: Flexisealm incontince.       Mobility Bed Mobility               General bed mobility comments: pt in chair  Transfers                 General transfer comment: pt sitting in chair        ADL either performed or assessed with clinical judgement   ADL Overall ADL's : Needs assistance/impaired Eating/Feeding: Minimal assistance;Sitting Eating/Feeding Details (indicate cue type and reason): pt able to feed self from sitting position in chair with set-up to min A Grooming: Minimal assistance;Set up                                 General ADL Comments: pt up in chair with PT. Pt fatigued but did agree to BUE exercise                   Pertinent Vitals/Pain Pain Assessment: No/denies pain     Hand Dominance     Extremity/Trunk Assessment Upper Extremity Assessment Upper Extremity Assessment: Generalized weakness (pt would benefit HEP to increase strength BUE)           Communication Communication Communication: No difficulties   Cognition Arousal/Alertness: Awake/alert Behavior During Therapy: WFL for tasks assessed/performed Overall Cognitive Status: No family/caregiver present to determine baseline cognitive functioning Area of Impairment: Orientation;Memory;Awareness                 Orientation Level: Place;Time;Situation             General Comments: patient reports that he is [place].   General Comments               Home Living Family/patient expects to be discharged to:: Private residence Living Arrangements: Non-relatives/Friends Available Help at Discharge: Personal care attendant;Family   Home Access: Ramped entrance     Home Layout: One level     Bathroom Shower/Tub: Walk-in shower         Home Equipment: Environmental consultant - 2 wheels   Additional Comments: from previous  encounter, lives at home with invalid daughter ith 24/7 caregivers.      Prior Functioning/Environment  Comments: pt. report that he is up adlib, fixes breakfast. chart indicates nonambulatory.        OT Problem List: Decreased strength;Decreased activity tolerance;Decreased safety awareness;Decreased knowledge of use of DME or AE      OT Treatment/Interventions: Self-care/ADL training;DME and/or AE instruction;Patient/family education    OT Goals(Current goals can be found in the care plan section) Acute Rehab OT Goals OT Goal Formulation: With patient Time For Goal Achievement: 07/09/16 Potential to Achieve Goals: Fair ADL Goals Pt Will Perform Upper Body Bathing: with min assist;sitting Pt Will Perform Lower Body Bathing: with min assist;sitting/lateral  leans;with adaptive equipment;bed level Pt/caregiver will Perform Home Exercise Program: Increased strength;Both right and left upper extremity;With written HEP provided  OT Frequency: Min 2X/week   Barriers to Randy [name]/C:               AM-PAC PT "6 Clicks" Daily Activity     Outcome Measure Help from another person eating meals?: A Little Help from another person taking care of personal grooming?: A Little Help from another person toileting, which includes using toliet, bedpan, or urinal?: Total Help from another person bathing (including washing, rinsing, drying)?: Total Help from another person to put on and taking off regular upper body clothing?: Total Help from another person to put on and taking off regular lower body clothing?: Total 6 Click Score: 10   End of Session Nurse Communication: Mobility status  Activity Tolerance: Patient limited by fatigue Patient left: in chair;with call bell/phone within reach  OT Visit Diagnosis: Muscle weakness (generalized) (M62.81)                Time: 8250-0370 OT Time Calculation (min): 9 min Charges:  OT General Charges $OT Visit: 1 Procedure OT Evaluation $OT Eval Moderate Complexity: 1 Procedure G-Codes:     Randy [name], Randy [name]  Randy [name] Randy [name] 06/25/2016, 12:01 PM

## 2016-06-25 NOTE — Progress Notes (Signed)
[facility] Infusion Coordinator following Randy [name] for home IV ABX for home infusion pharmacy support at [place].  If patient discharges after hours, please call [phone].   [name] 06/25/2016, 7:19 AM

## 2016-06-25 NOTE — Consult Note (Signed)
[facility] Nurse wound consult note Reason for Consult: Reconsulted on buttocks and intertriginous areas of dermatitis.  Also edematous LEs and heels at risk.  Added Prevalon Boots, changed Butt paste to Gerhardt's butt cream to add antifungal hydrocortisone creams and have discontinued powder in favor of using our house antimicrobial textile. Turning and repositioning is in place; guidance provided for specifics.  Patient is on a therapeutic mattress with low air loss feature. [facility] nursing team will not follow, but will remain available to this patient, the nursing and medical teams.  Please re-consult if needed. Thanks, Maudie Flakes, MSN, RN, [place], Arther Abbott  Pager# [phone]

## 2016-06-26 ENCOUNTER — Inpatient Hospital Stay (HOSPITAL_COMMUNITY): Payer: Medicare Other

## 2016-06-26 DIAGNOSIS — D638 Anemia in other chronic diseases classified elsewhere: Secondary | ICD-10-CM

## 2016-06-26 DIAGNOSIS — R195 Other fecal abnormalities: Secondary | ICD-10-CM

## 2016-06-26 DIAGNOSIS — Z7901 Long term (current) use of anticoagulants: Secondary | ICD-10-CM

## 2016-06-26 LAB — CBC WITH DIFFERENTIAL/PLATELET
BASOS ABS: 0 10*3/uL (ref 0.0–0.1)
Basophils Relative: 0 %
Eosinophils Absolute: 0.6 10*3/uL (ref 0.0–0.7)
Eosinophils Relative: 4 %
HEMATOCRIT: 25.7 % — AB (ref 39.0–52.0)
Hemoglobin: 8.6 g/dL — ABNORMAL LOW (ref 13.0–17.0)
LYMPHS ABS: 3.3 10*3/uL (ref 0.7–4.0)
LYMPHS PCT: 24 %
MCH: 28.3 pg (ref 26.0–34.0)
MCHC: 33.5 g/dL (ref 30.0–36.0)
MCV: 84.5 fL (ref 78.0–100.0)
Monocytes Absolute: 1.5 10*3/uL — ABNORMAL HIGH (ref 0.1–1.0)
Monocytes Relative: 11 %
Neutro Abs: 8.4 10*3/uL — ABNORMAL HIGH (ref 1.7–7.7)
Neutrophils Relative %: 61 %
Platelets: 527 10*3/uL — ABNORMAL HIGH (ref 150–400)
RBC: 3.04 MIL/uL — AB (ref 4.22–5.81)
RDW: 16.3 % — AB (ref 11.5–15.5)
WBC: 13.8 10*3/uL — AB (ref 4.0–10.5)

## 2016-06-26 LAB — GLUCOSE, CAPILLARY
GLUCOSE-CAPILLARY: 178 mg/dL — AB (ref 65–99)
GLUCOSE-CAPILLARY: 213 mg/dL — AB (ref 65–99)
GLUCOSE-CAPILLARY: 183 mg/dL — AB (ref 65–99)
Glucose-Capillary: 149 mg/dL — ABNORMAL HIGH (ref 65–99)

## 2016-06-26 LAB — BASIC METABOLIC PANEL
ANION GAP: 9 (ref 5–15)
BUN: 13 mg/dL (ref 6–20)
CALCIUM: 7.9 mg/dL — AB (ref 8.9–10.3)
CO2: 22 mmol/L (ref 22–32)
Chloride: 108 mmol/L (ref 101–111)
Creatinine, Ser: 1.8 mg/dL — ABNORMAL HIGH (ref 0.61–1.24)
GFR, EST AFRICAN AMERICAN: 36 mL/min — AB (ref >60)
GFR, EST NON AFRICAN AMERICAN: 31 mL/min — AB (ref >60)
Glucose, Bld: 202 mg/dL — ABNORMAL HIGH (ref 65–99)
Potassium: 3.5 mmol/L (ref 3.5–5.1)
Sodium: 139 mmol/L (ref 135–145)

## 2016-06-26 LAB — CBC
HEMATOCRIT: 24.6 % — AB (ref 39.0–52.0)
HEMOGLOBIN: 8 g/dL — AB (ref 13.0–17.0)
MCH: 27.2 pg (ref 26.0–34.0)
MCHC: 32.5 g/dL (ref 30.0–36.0)
MCV: 83.7 fL (ref 78.0–100.0)
Platelets: 514 10*3/uL — ABNORMAL HIGH (ref 150–400)
RBC: 2.94 MIL/uL — AB (ref 4.22–5.81)
RDW: 16.6 % — ABNORMAL HIGH (ref 11.5–15.5)
WBC: 14.7 10*3/uL — ABNORMAL HIGH (ref 4.0–10.5)

## 2016-06-26 LAB — C DIFFICILE QUICK SCREEN W PCR REFLEX
C DIFFICLE (CDIFF) ANTIGEN: NEGATIVE
C Diff interpretation: NOT DETECTED
C Diff toxin: NEGATIVE

## 2016-06-26 LAB — HEMOGLOBIN A1C
Hgb A1c MFr Bld: 11.5 % — ABNORMAL HIGH (ref 4.8–5.6)
Mean Plasma Glucose: 283 mg/dL

## 2016-06-26 LAB — OCCULT BLOOD X 1 CARD TO LAB, STOOL: Fecal Occult Bld: NEGATIVE

## 2016-06-26 LAB — HEPARIN LEVEL (UNFRACTIONATED)
HEPARIN UNFRACTIONATED: 0.33 [IU]/mL (ref 0.30–0.70)
HEPARIN UNFRACTIONATED: 0.38 [IU]/mL (ref 0.30–0.70)

## 2016-06-26 MED ORDER — MAGNESIUM SULFATE 2 GM/50ML IV SOLN
2.0000 g | Freq: Once | INTRAVENOUS | Status: AC
Start: 2016-06-26 — End: 2016-06-26
  Administered 2016-06-26: 2 g via INTRAVENOUS
  Filled 2016-06-26: qty 50

## 2016-06-26 MED ORDER — PANTOPRAZOLE SODIUM 40 MG PO TBEC
40.0000 mg | DELAYED_RELEASE_TABLET | Freq: Two times a day (BID) | ORAL | Status: DC
  Administered 2016-06-26 – 2016-07-06 (×20): 40 mg via ORAL
  Filled 2016-06-26 (×19): qty 1

## 2016-06-26 MED ORDER — POTASSIUM CHLORIDE IN NACL 20-0.9 MEQ/L-% IV SOLN
INTRAVENOUS | Status: AC
  Administered 2016-06-27: 06:00:00 via INTRAVENOUS
  Filled 2016-06-26 (×2): qty 1000

## 2016-06-26 MED ORDER — INSULIN ASPART 100 UNIT/ML ~~LOC~~ SOLN
6.0000 [IU] | Freq: Three times a day (TID) | SUBCUTANEOUS | Status: DC
  Administered 2016-06-26 – 2016-06-27 (×3): 6 [IU] via SUBCUTANEOUS

## 2016-06-26 MED ORDER — SODIUM CHLORIDE 0.9 % IV SOLN: INTRAVENOUS | Status: DC

## 2016-06-26 MED ORDER — INSULIN DETEMIR 100 UNIT/ML ~~LOC~~ SOLN
8.0000 [IU] | Freq: Two times a day (BID) | SUBCUTANEOUS | Status: DC
  Administered 2016-06-26 – 2016-06-28 (×4): 8 [IU] via SUBCUTANEOUS
  Filled 2016-06-26 (×5): qty 0.08

## 2016-06-26 NOTE — Progress Notes (Signed)
PROGRESS NOTE  DANIELLE LENTO AJO:[id number] DOB: [birth year] DOA: 06/17/2016 PCP: System, Pcp Not In  Brief History: 83 y.o.gentleman with a history of mild dementia/cognitive impairment, CAD with prior stent placement, history of atrial fibrillation S/P PPM implant (CHADS-Vasc score of at least four, no anticoagulation), HTN, HLD, DM, and CKD 3 with baseline creatinine around 1.3-1.5 who was referred to the ED via EMS by one of his caregivers for evaluation of increased lethargy, mental status changes, and cough. The patient is nonambulatory at baseline and spends most of his day sitting up a recliner. He has caregiver support 24 hours/day. He has had cough for 4-5 days. He has had two known sick contacts (son-in-law and caregiver). He has had yellow sputum. No chest pain or shortness of breath. No nausea or vomiting/diarrhea. No abdominal pain. No dysuria/hematuria On 06/17/16, he was noted to have increased lethargy and muffled speech with slurring. No other focal weakenss. 911 was called and the patient was transferred to the ED for evaluation. No recent hospitalizations. No recent antibiotics. He has been using OTC mucinex for his symptoms.  Assessment/Plan: Sepsis secondary to MSSA/Enterococcus bacteremia/MSSA Right prosthetic knee infection -Secondary to bacteremia and prosthetic knee infection -Presented with fever, tachycardia, tachypnea, leukocytosis -Patient also had elevated pro calcitonin level as well as lactic acid -Initially placed on vancomycin and zosyn, and transitioned to Unasyn -Chest x-ray, UA unremarkable for infection  -CT chest showed subpleural fibrosis, no acute pulmonary infiltrates. -Influenza PCR, strep pneumonia urine antigen negative -Blood cultures 06/17/2016: MSSA, enterococcus  -sepsis physiology resolved  MSSA/Enterococcus bacteremia/MSSA Right prosthetic knee infection -Blood cultures 06/17/2016: MSSA, E faecalis -Repeat blood  cultures 06/19/16 no growth to date -Echocardiogram (TTE)showed EF 55-60%, moderate AS, trivial MR/TR/AI -Infectious disease consulted and appreciated, recommended TEE and EP consult -Cardiology consulted and appreciated- TEE attempted multiple times on 06/22/16-  -Per cardiology, patient is at high risk of complication due to high risk comorbidities. No plan for device extraction at this time. -Orthopedic surgery consulted and appreciated for infected knee. S/p aspiration x2-->grew MSSA -Patient seen by Dr. Mayer Camel (ortho), patient would need an AKA, however patient seen as a poor surgical candidate. Ortho has discussed this with the patient's son -Patient will need IV unasyn for 6 weeks, and followed by PO amox/clavfor suppression thereafter. -06/22/16 PICC line -06/26/16--case discussed with ortho--no plans for surgery--monitor for serial knee aspirations, f/u office in one week after d/c; repeated R-knee aspiration 5/11  Right and Leftlower extremity DVT -clinically provoked, but son states that pt has had 2 or 3 previous episodes of DVTs in past -son unclear why pt is not on chronic anticoagulation -LE doppler--acute DVT Right common femoral, femoral, and popliteal veinsand Left common femoral and femoral vein -started IV heparin -transition to po apixaban on 06/27/16 if no further plans for any surgical or invasive interventions  Dysphagia -Speech consulted and recommended FULL liquids due to abnormal MBS results -will ask speech to re-evaluation again as pt is near discharge -son states pt was on regular diet prior to admission but did require food to be chopped up -discussed with son--he does not want to restrict diet, but does not seem to full accept or fully understand the risk of aspiration--"he's always eaten solid food without problems"  Acute Blood Loss Anemia -drop ~3 grams Hgb in past 3 days -stool melenotic -FOBT -consult GI -repeat CBC in am  Acute on Chronic kidney  disease, stage III -Creatinine baseline at  1.3-1.6 -Serum creatinine peaked 2.03 -Creatinine trending back up--likely increase losses from loose stool -24 hours IVF -Am BMP  Diarrhea -check Cdiff with WBC increasing -continue Flexiseal  Dermatophytosis/tinea cruris -Groin area appears to be erythematous and flaky -discontinue diflucan--pt finished one week -continue mycostatin topical -suspect pt has component of incontinence dermatitis  Essential hypertension -Currently on no home medications, continue IV hydralazine PRN  Diabetes mellitus, type II -Currently on no home medications -Continue insulin sliding scale, Levemir, CBG monitoring -06/25/16 check A1C--11.5 -increase novolog 6 units with meals -increase levemir to 8 units bid  History of atrial fibrillation -CHADSVASC 5 (based on age, DM, HTN, CAD) -previously not on [facility] -Status post pacemaker placement- cardiology consulted as patient does have bacteremia, as above, no plan for device extraction at this time.   Stage II pressure injury -moisture related -wound care consulted and appreciated  Moderate aortic stenosis -Noted on echocardiogram  Cognitive impairment -Per son patient can sometimes be clear minded but is not always  Hypokalemia -K 3.0, continue to replete -Monitor BMP -mag= 1.8  Hypomagnesemia -Magnesium 1.8, continue to monitor and replace as needed  Goals of care -Had discuss with son via phone regarding patient, current treatment and plan, code status, mental status. Per son, patient's mental status is not always clear and at this time he would like to remain full code and possible have surgeries that might be needed. -Cardio and ortho recommend palliative care consult. Discussed with son, does not feel palliative care consult is needed right now.  DVT Prophylaxislovenox  Code Status:Full  Family Communication:updated son on phone 5/10  Disposition Plan:Home 5/12 or  5/13 if cleared by consultants--son refuses SNF 06/26/16--Total time spent 35 minutes. Greater than 50% spent face to face counseling and coordinating care.   Consultants Infectious disease Cardiology Orthopedics  Procedures  Echocardiogram Right knee aspiration x2 Attempted TEE  Antibiotics vanco 5/2>>> Unasyn 5/7>>> Nafcillin 5/3>>5/7    Subjective:   Objective: Vitals:   06/25/16 0551 06/25/16 1318 06/25/16 2124 06/26/16 0439  BP: (!) 118/45 (!) 141/68 (!) 147/45 (!) 150/57  Pulse: 67 (!) 46 76 78  Resp: 20 18 18 18   Temp: 98.2 F (36.8 C) 98.5 F (36.9 C) 98.4 F (36.9 C) 98.2 F (36.8 C)  TempSrc: Oral Oral Oral Oral  SpO2: 99% 97% 96% 98%  Weight:      Height:        Intake/Output Summary (Last 24 hours) at 06/26/16 1304 Last data filed at 06/26/16 5643  Gross per 24 hour  Intake          1609.03 ml  Output             1900 ml  Net          -290.97 ml   Weight change:  Exam:   General:  Pt is alert, follows commands appropriately, not in acute distress  HEENT: No icterus, No thrush, No neck mass, [place]/AT  Cardiovascular: RRR, S1/S2, no rubs, no gallops  Respiratory: CTA bilaterally, no wheezing, no crackles, no rhonchi  Abdomen: Soft/+BS, non tender, non distended, no guarding  Extremities: No edema, No lymphangitis, No petechiae, No rashes, no synovitis   Data Reviewed: I have personally reviewed following labs and imaging studies Basic Metabolic Panel:  Recent Labs Lab 06/22/16 0352 06/23/16 0331 06/23/16 0349 06/24/16 0348 06/25/16 0435 06/26/16 0409  NA 139 139  --  138 138 139  K 3.2* 3.0*  --  3.6 3.4* 3.5  CL 111 112*  --  110 110 108  CO2 20* 21*  --  21* 22 22  GLUCOSE 123* 119*  --  185* 140* 202*  BUN 18 16  --  17 15 13   CREATININE 1.83* 1.89*  --  1.69* 1.71* 1.80*  CALCIUM 7.2* 7.5*  --  7.8* 7.7* 7.9*  MG 1.6*  --  1.9  --  1.8  --   PHOS 2.0*  --   --   --   --   --    Liver Function Tests: No results  for input(s): AST, ALT, ALKPHOS, BILITOT, PROT, ALBUMIN in the last 168 hours. No results for input(s): LIPASE, AMYLASE in the last 168 hours. No results for input(s): AMMONIA in the last 168 hours. Coagulation Profile: No results for input(s): INR, PROTIME in the last 168 hours. CBC:  Recent Labs Lab 06/21/16 0334 06/22/16 0352 06/23/16 0331 06/24/16 0348 06/26/16 0409  WBC 11.5* 10.6* 11.2* 12.2* 13.8*  NEUTROABS  --   --   --   --  8.4*  HGB 12.0* 11.3* 11.1* 10.1* 8.6*  HCT 36.9* 34.8* 33.9* 30.5* 25.7*  MCV 85.2 85.1 84.3 83.8 84.5  PLT 291 305 350 404* 527*   Cardiac Enzymes: No results for input(s): CKTOTAL, CKMB, CKMBINDEX, TROPONINI in the last 168 hours. BNP: Invalid input(s): POCBNP CBG:  Recent Labs Lab 06/25/16 1131 06/25/16 1636 06/25/16 2155 06/26/16 0729 06/26/16 1146  GLUCAP 238* 225* 165* 178* 213*   HbA1C:  Recent Labs  06/25/16 0435  HGBA1C 11.5*   Urine analysis:    Component Value Date/Time   COLORURINE YELLOW 06/17/2016 1959   APPEARANCEUR CLEAR 06/17/2016 1959   LABSPEC 1.020 06/17/2016 1959   PHURINE 5.0 06/17/2016 1959   GLUCOSEU >=500 (A) 06/17/2016 1959   HGBUR SMALL (A) 06/17/2016 1959   BILIRUBINUR NEGATIVE 06/17/2016 1959   KETONESUR 5 (A) 06/17/2016 1959   PROTEINUR 30 (A) 06/17/2016 1959   UROBILINOGEN 0.2 05/18/2014 1634   NITRITE NEGATIVE 06/17/2016 1959   LEUKOCYTESUR NEGATIVE 06/17/2016 1959   Sepsis Labs: @LABRCNTIP (procalcitonin:4,lacticidven:4) ) Recent Results (from the past 240 hour(s))  Blood Culture (routine x 2)     Status: Abnormal   Collection Time: 06/17/16  7:52 PM  Result Value Ref Range Status   Specimen Description BLOOD BLOOD LEFT FOREARM  Final   Special Requests IN PEDIATRIC BOTTLE Blood Culture adequate volume  Final   Culture  Setup Time   Final    GRAM POSITIVE COCCI IN CLUSTERS IN PEDIATRIC BOTTLE CRITICAL VALUE NOTED.  VALUE IS CONSISTENT WITH PREVIOUSLY REPORTED AND CALLED VALUE.     Culture (A)  Final    STAPHYLOCOCCUS SPECIES (COAGULASE NEGATIVE) THE SIGNIFICANCE OF ISOLATING THIS ORGANISM FROM A SINGLE SET OF BLOOD CULTURES WHEN MULTIPLE SETS ARE DRAWN IS UNCERTAIN. PLEASE NOTIFY THE MICROBIOLOGY DEPARTMENT WITHIN ONE WEEK IF SPECIATION AND SENSITIVITIES ARE REQUIRED. Performed at [facility], [place] [address], [place], [place] [address]    Report Status 06/19/2016 FINAL  Final  Blood Culture (routine x 2)     Status: Abnormal   Collection Time: 06/17/16  8:12 PM  Result Value Ref Range Status   Specimen Description BLOOD RIGHT ANTECUBITAL  Final   Special Requests   Final    BOTTLES DRAWN AEROBIC AND ANAEROBIC Blood Culture results may not be optimal due to an inadequate volume of blood received in culture bottles   Culture  Setup Time   Final    GRAM POSITIVE COCCI IN CHAINS IN BOTH AEROBIC  AND ANAEROBIC BOTTLES CRITICAL RESULT CALLED TO, READ BACK BY AND VERIFIED WITH: N. Glogovac Pharm.D. 15:45 06/18/16 ([email or web address]) Performed at [facility], [place] [address], [place], [place] [address]    Culture STAPHYLOCOCCUS AUREUS ENTEROCOCCUS FAECALIS  (A)  Final   Report Status 06/21/2016 FINAL  Final   Organism ID, Bacteria STAPHYLOCOCCUS AUREUS  Final   Organism ID, Bacteria ENTEROCOCCUS FAECALIS  Final      Susceptibility   Enterococcus faecalis - MIC*    AMPICILLIN <=2 SENSITIVE Sensitive     VANCOMYCIN <=0.5 SENSITIVE Sensitive     GENTAMICIN SYNERGY SENSITIVE Sensitive     * ENTEROCOCCUS FAECALIS   Staphylococcus aureus - MIC*    CIPROFLOXACIN <=0.5 SENSITIVE Sensitive     ERYTHROMYCIN <=0.25 SENSITIVE Sensitive     GENTAMICIN <=0.5 SENSITIVE Sensitive     OXACILLIN <=0.25 SENSITIVE Sensitive     TETRACYCLINE <=1 SENSITIVE Sensitive     VANCOMYCIN <=0.5 SENSITIVE Sensitive     TRIMETH/SULFA <=10 SENSITIVE Sensitive     CLINDAMYCIN <=0.25 SENSITIVE Sensitive     RIFAMPIN <=0.5 SENSITIVE Sensitive     Inducible Clindamycin NEGATIVE Sensitive     *  STAPHYLOCOCCUS AUREUS  Blood Culture ID Panel (Reflexed)     Status: Abnormal   Collection Time: 06/17/16  8:12 PM  Result Value Ref Range Status   Enterococcus species DETECTED (A) NOT DETECTED Final    Comment: CRITICAL RESULT CALLED TO, READ BACK BY AND VERIFIED WITH: N. Glogovac Pharm.D. 15:45 06/18/16 (wilsonm)    Vancomycin resistance NOT DETECTED NOT DETECTED Final   Listeria monocytogenes NOT DETECTED NOT DETECTED Final   Staphylococcus species DETECTED (A) NOT DETECTED Final    Comment: CRITICAL RESULT CALLED TO, READ BACK BY AND VERIFIED WITH: N. Glogovac Pharm.D. 15:45 06/18/16 (wilsonm)    Staphylococcus aureus DETECTED (A) NOT DETECTED Final    Comment: Methicillin (oxacillin) susceptible Staphylococcus aureus (MSSA). Preferred therapy is anti staphylococcal beta lactam antibiotic (Cefazolin or Nafcillin), unless clinically contraindicated. CRITICAL RESULT CALLED TO, READ BACK BY AND VERIFIED WITH: N. Glogovac Pharm.D. 15:45 06/18/16 (wilsonm)    Methicillin resistance NOT DETECTED NOT DETECTED Final   Streptococcus species NOT DETECTED NOT DETECTED Final   Streptococcus agalactiae NOT DETECTED NOT DETECTED Final   Streptococcus pneumoniae NOT DETECTED NOT DETECTED Final   Streptococcus pyogenes NOT DETECTED NOT DETECTED Final   Acinetobacter baumannii NOT DETECTED NOT DETECTED Final   Enterobacteriaceae species NOT DETECTED NOT DETECTED Final   Enterobacter cloacae complex NOT DETECTED NOT DETECTED Final   Escherichia coli NOT DETECTED NOT DETECTED Final   Klebsiella oxytoca NOT DETECTED NOT DETECTED Final   Klebsiella pneumoniae NOT DETECTED NOT DETECTED Final   Proteus species NOT DETECTED NOT DETECTED Final   Serratia marcescens NOT DETECTED NOT DETECTED Final   Haemophilus influenzae NOT DETECTED NOT DETECTED Final   Neisseria meningitidis NOT DETECTED NOT DETECTED Final   Pseudomonas aeruginosa NOT DETECTED NOT DETECTED Final   Candida albicans NOT DETECTED NOT  DETECTED Final   Candida glabrata NOT DETECTED NOT DETECTED Final   Candida krusei NOT DETECTED NOT DETECTED Final   Candida parapsilosis NOT DETECTED NOT DETECTED Final   Candida tropicalis NOT DETECTED NOT DETECTED Final    Comment: Performed at [facility], [address] [place],  [address]  MRSA PCR Screening     Status: Abnormal   Collection Time: 06/18/16  1:40 AM  Result Value Ref Range Status   MRSA by PCR POSITIVE (A) NEGATIVE Final  Comment:        The GeneXpert MRSA Assay (FDA approved for NASAL specimens only), is one component of a comprehensive MRSA colonization surveillance program. It is not intended to diagnose MRSA infection nor to guide or monitor treatment for MRSA infections. RESULT CALLED TO, READ BACK BY AND VERIFIED WITH: C.SMITH,RN [id number] 06/18/16 W.SHEA   Culture, respiratory (NON-Expectorated)     Status: None   Collection Time: 06/18/16  6:32 AM  Result Value Ref Range Status   Specimen Description TRACHEAL ASPIRATE  Final   Special Requests NONE  Final   Gram Stain   Final    ABUNDANT WBC PRESENT, PREDOMINANTLY PMN RARE SQUAMOUS EPITHELIAL CELLS PRESENT MODERATE GRAM POSITIVE RODS GRAM POSITIVE COCCI IN PAIRS    Culture   Final    Consistent with normal respiratory flora. Performed at [facility], [place] [address], [place], [place] [address]    Report Status 06/20/2016 FINAL  Final  Culture, blood (Routine X 2) w Reflex to ID Panel     Status: None   Collection Time: 06/18/16  6:16 PM  Result Value Ref Range Status   Specimen Description BLOOD RIGHT ARM  Final   Special Requests IN PEDIATRIC BOTTLE Blood Culture adequate volume  Final   Culture   Final    NO GROWTH 5 DAYS Performed at [facility], [place] [address], [place], [place] [address]    Report Status 06/23/2016 FINAL  Final  Culture, blood (Routine X 2) w Reflex to ID Panel     Status: Abnormal   Collection Time: 06/18/16  6:22 PM  Result Value Ref Range  Status   Specimen Description BLOOD RIGHT ANTECUBITAL  Final   Special Requests IN PEDIATRIC BOTTLE Blood Culture adequate volume  Final   Culture  Setup Time   Final    GRAM POSITIVE COCCI IN CLUSTERS IN PEDIATRIC BOTTLE CRITICAL RESULT CALLED TO, READ BACK BY AND VERIFIED WITH: D ZEIGLER PHARMD 1932 06/19/16 A BROWNING    Culture (A)  Final    STAPHYLOCOCCUS AUREUS SUSCEPTIBILITIES PERFORMED ON PREVIOUS CULTURE WITHIN THE LAST 5 DAYS. Performed at [facility], [place] [address], [place], [place] [address]    Report Status 06/21/2016 FINAL  Final  Blood Culture ID Panel (Reflexed)     Status: Abnormal   Collection Time: 06/18/16  6:22 PM  Result Value Ref Range Status   Enterococcus species NOT DETECTED NOT DETECTED Final   Listeria monocytogenes NOT DETECTED NOT DETECTED Final   Staphylococcus species DETECTED (A) NOT DETECTED Final    Comment: CRITICAL RESULT CALLED TO, READ BACK BY AND VERIFIED WITHMarcy Siren PHARMD 1932 06/19/16 A BROWNING    Staphylococcus aureus DETECTED (A) NOT DETECTED Final    Comment: Methicillin (oxacillin) susceptible Staphylococcus aureus (MSSA). Preferred therapy is anti staphylococcal beta lactam antibiotic (Cefazolin or Nafcillin), unless clinically contraindicated. CRITICAL RESULT CALLED TO, READ BACK BY AND VERIFIED WITHMarcy Siren PHARMD 1932 06/19/16 A BROWNING    Methicillin resistance NOT DETECTED NOT DETECTED Final   Streptococcus species NOT DETECTED NOT DETECTED Final   Streptococcus agalactiae NOT DETECTED NOT DETECTED Final   Streptococcus pneumoniae NOT DETECTED NOT DETECTED Final   Streptococcus pyogenes NOT DETECTED NOT DETECTED Final   Acinetobacter baumannii NOT DETECTED NOT DETECTED Final   Enterobacteriaceae species NOT DETECTED NOT DETECTED Final   Enterobacter cloacae complex NOT DETECTED NOT DETECTED Final   Escherichia coli NOT DETECTED NOT DETECTED Final   Klebsiella oxytoca NOT  DETECTED NOT DETECTED Final   Klebsiella  pneumoniae NOT DETECTED NOT DETECTED Final   Proteus species NOT DETECTED NOT DETECTED Final   Serratia marcescens NOT DETECTED NOT DETECTED Final   Haemophilus influenzae NOT DETECTED NOT DETECTED Final   Neisseria meningitidis NOT DETECTED NOT DETECTED Final   Pseudomonas aeruginosa NOT DETECTED NOT DETECTED Final   Candida albicans NOT DETECTED NOT DETECTED Final   Candida glabrata NOT DETECTED NOT DETECTED Final   Candida krusei NOT DETECTED NOT DETECTED Final   Candida parapsilosis NOT DETECTED NOT DETECTED Final   Candida tropicalis NOT DETECTED NOT DETECTED Final    Comment: Performed at [facility], [place] [address], [place], [place] [address]  Culture, blood (Routine X 2) w Reflex to ID Panel     Status: None   Collection Time: 06/19/16 11:08 AM  Result Value Ref Range Status   Specimen Description BLOOD RIGHT ARM  Final   Special Requests IN PEDIATRIC BOTTLE Blood Culture adequate volume  Final   Culture   Final    NO GROWTH 5 DAYS Performed at [facility], [place] [address], [place], [place] [address]    Report Status 06/24/2016 FINAL  Final  Culture, blood (Routine X 2) w Reflex to ID Panel     Status: None   Collection Time: 06/19/16 11:08 AM  Result Value Ref Range Status   Specimen Description BLOOD RIGHT HAND  Final   Special Requests IN PEDIATRIC BOTTLE Blood Culture adequate volume  Final   Culture   Final    NO GROWTH 5 DAYS Performed at [facility], [place] [address] [place], [place] [address]    Report Status 06/24/2016 FINAL  Final  Body fluid culture     Status: None   Collection Time: 06/19/16 12:25 PM  Result Value Ref Range Status   Specimen Description SYNOVIAL RIGHT KNEE  Final   Special Requests NONE  Final   Gram Stain   Final    MODERATE WBC PRESENT,BOTH PMN AND MONONUCLEAR FEW GRAM POSITIVE COCCI IN PAIRS    Culture   Final    MODERATE STAPHYLOCOCCUS AUREUS NO ANAEROBES ISOLATED Performed at [facility], [place] [address], [place], [place] [address]    Report Status 06/22/2016 FINAL  Final   Organism ID, Bacteria STAPHYLOCOCCUS AUREUS  Final      Susceptibility   Staphylococcus aureus - MIC*    CIPROFLOXACIN <=0.5 SENSITIVE Sensitive     ERYTHROMYCIN <=0.25 SENSITIVE Sensitive     GENTAMICIN <=0.5 SENSITIVE Sensitive     OXACILLIN <=0.25 SENSITIVE Sensitive     TETRACYCLINE <=1 SENSITIVE Sensitive     VANCOMYCIN <=0.5 SENSITIVE Sensitive     TRIMETH/SULFA <=10 SENSITIVE Sensitive     CLINDAMYCIN <=0.25 SENSITIVE Sensitive     RIFAMPIN <=0.5 SENSITIVE Sensitive     Inducible Clindamycin NEGATIVE Sensitive     * MODERATE STAPHYLOCOCCUS AUREUS     Scheduled Meds: . chlorhexidine  15 mL Mouth Rinse BID  . Chlorhexidine Gluconate Cloth  6 each Topical Daily  . feeding supplement  1 Container Oral BID BM  . Gerhardt's butt cream   Topical TID  . insulin aspart  0-20 Units Subcutaneous TID WC  . insulin aspart  0-5 Units Subcutaneous QHS  . insulin aspart  4 Units Subcutaneous TID WC  . insulin detemir  6 Units Subcutaneous BID  . mouth rinse  15 mL Mouth Rinse q12n4p  . sodium chloride flush  10-40 mL Intracatheter Q12H   Continuous Infusions: . sodium chloride 10 mL/hr at 06/24/16 0600  . sodium chloride    . ampicillin-sulbactam (UNASYN) IV Stopped (06/26/16 0948)  . heparin 1,600 Units/hr (06/26/16 0119)    Procedures/Studies: Dg Chest 2 View  Result Date: 06/17/2016 CLINICAL DATA:  Lethargy and cough EXAM: CHEST  2 VIEW COMPARISON:  04/23/2014 FINDINGS: Cardiac shadow is stable. Pacing device is again noted. The lungs are well aerated bilaterally. No bony abnormality is noted. IMPRESSION: No active cardiopulmonary disease. Electronically Signed   By: Inez Catalina M.D.   On: 06/17/2016 20:38   Ct Chest Wo Contrast  Result Date: 06/18/2016 CLINICAL DATA:  Lethargy and cough for 2 days EXAM: CT CHEST WITHOUT CONTRAST TECHNIQUE: Multidetector CT imaging of the chest was performed following the  standard protocol without IV contrast. COMPARISON:  06/17/2016, CT 04/17/2016 FINDINGS: Cardiovascular: Limited evaluation without intravenous contrast. Aortic atherosclerosis. No aneurysmal dilatation. Coronary artery calcification. Partially visualized cardiac pacing leads. Mild cardiomegaly. No large pericardial effusion. Mediastinum/Nodes: Midline trachea. Few prominent sub- carinal and periesophageal lymph nodes, measuring up to 13 mm. Esophagus within normal limits. Esophagus Lungs/Pleura: Mild subpleural fibrosis. No focal consolidation, pleural effusion, or pneumothorax. Upper Abdomen: No acute abnormality. 9.6 cm cyst in the mid to upper right kidney. Vague hypodensity in the upper pole of the left kidney also consistent with a cyst Musculoskeletal: Degenerative changes of the spine. No acute or suspicious bone lesion. IMPRESSION: 1. Mild subpleural fibrosis. No acute pulmonary infiltrates are visualized. 2. Mild nonspecific mediastinal adenopathy 3. Cardiomegaly 4. Bilateral kidney cysts, measuring up to 9.6 cm on the right. Electronically Signed   By: Donavan Foil M.D.   On: 06/18/2016 00:26   Dg Chest Port 1 View  Result Date: 06/23/2016 CLINICAL DATA:  Central line placement. EXAM: PORTABLE CHEST 1 VIEW COMPARISON:  06/17/2016 FINDINGS: Right arm PICC has its tip in the proximal right atrium. Withdrawal of 2 cm if this is desired to be above the right atrium. Pacemaker leads appear the same. Cardiomegaly. Chronic pulmonary scarring. IMPRESSION: Right arm PICC tip in the right atrium. Withdraw 2 cm to be at the SVC RA junction, if that is desired. Electronically Signed   By: Nelson Chimes M.D.   On: 06/23/2016 15:56   Dg Swallowing Func-speech Pathology  Result Date: 06/19/2016 Objective Swallowing Evaluation: Type of Study: MBS-Modified Barium Swallow Study Patient Details Name: MATIAS THURMAN MRN: [id number] Date of Birth: April 11, 1924 Today's Date: 06/19/2016 Time: SLP Start Time (ACUTE ONLY):  1520-SLP Stop Time (ACUTE ONLY): 1545 SLP Time Calculation (min) (ACUTE ONLY): 25 min Past Medical History: Past Medical History: Diagnosis Date . Anxiety  . Arthritis   R knee,  . CKD (chronic kidney disease)  . Coronary artery disease  . Diabetes mellitus without complication ([place])  . Dysrhythmia   PAF;,sss s/p St. Jude PPM . Hyperlipidemia  . Hypertension  . MI (myocardial infarction) ([place]) 2014 . Presence of permanent cardiac pacemaker  Past Surgical History: Past Surgical History: Procedure Laterality Date . CHOLECYSTECTOMY   . CORONARY ANGIOPLASTY    mLAD '13, RCA DES 03/2013 . EXCISIONAL TOTAL KNEE ARTHROPLASTY WITH ANTIBIOTIC SPACERS Right 04/20/2014  Procedure: IRRIGATION AND DEBRIDMENT RIGHT TOTAL KNEE REMOVE  ACL IMPLANTED AND PLACE SPACER;  Surgeon: [name], MD;  Location: [place];  Service: Orthopedics;  Laterality: Right; . HEMORROIDECTOMY   . KNEE SURGERY  2006 . PACEMAKER INSERTION   . PICC LINE PLACE PERIPHERAL ([place] HX)  04/2014  R upper  arm  . TOTAL KNEE ARTHROPLASTY WITH REVISION COMPONENTS Right 06/11/2014  Procedure: TOTAL KNEE ARTHROPLASTY WITH REVISION COMPONENTS REMOVE SPACER PLACE TKA;  Surgeon: [name], MD;  Location: [facility];  Service: Orthopedics;  Laterality: Right; HPI: pt admit to [facility] with AMS, cough- found to be bacterial septic.  PMH + for DM, MI, no CVA hx. CT chest and CXR negative.  Swallow eval ordered due to pt having dysphagia.  Subjective: pt awake in chair Assessment / Plan / Recommendation CHL IP CLINICAL IMPRESSIONS 06/19/2016 Clinical Impression Pt presents with severe pharyngeal dysphagia - narrow pharynx and decreased tongue base retraction results in very poor epiglottic deflection and gross pharyngeal/vallecular residuals.  Pt requires multiple swallows (up to 5-6) with each bolus and still has vallecular residuals. He did not aspirate or penetrate surprisingly.  Residuals were much worse with solids/pudding than liquids.  Various strategies/postures including head turn  left, multiple swallows, following solid with liquids mitigate dysphagia.  Pt did not fully clear vallecular space but can fortunately hock and expectorate to clear.  Pt reports his swallow ability during MBS is "normal" for him causing SLP to suspect baseline significant dysphagia that he has been managing.  Recommend strict precautions to mitigate aspiration risk and modify diet to full liquids. Using teach back, pt able to demonstrate multiple swallows, "hocking" and oral suctioning independently.  Will follow for po tolerance, readiness for dietary advancement.   SLP Visit Diagnosis Dysphagia, oropharyngeal phase (R13.12) Attention and concentration deficit following -- Frontal lobe and executive function deficit following -- Impact on safety and function Severe aspiration risk;Risk for inadequate nutrition/hydration   CHL IP TREATMENT RECOMMENDATION 06/19/2016 Treatment Recommendations Therapy as outlined in treatment plan below   Prognosis 06/19/2016 Prognosis for Safe Diet Advancement Fair Barriers to Reach Goals Time post onset Barriers/Prognosis Comment -- CHL IP DIET RECOMMENDATION 06/19/2016 SLP Diet Recommendations Nectar thick liquid;Thin liquid Liquid Administration via Cup;Straw Medication Administration (No Data) Compensations Slow rate;Small sips/bites;Follow solids with liquid;Multiple dry swallows after each bite/sip Postural Changes Seated upright at 90 degrees;Remain semi-upright after after feeds/meals (Comment)   CHL IP OTHER RECOMMENDATIONS 06/19/2016 Recommended Consults -- Oral Care Recommendations Oral care before and after PO Other Recommendations --   CHL IP FOLLOW UP RECOMMENDATIONS 06/19/2016 Follow up Recommendations (No Data)   CHL IP FREQUENCY AND DURATION 06/19/2016 Speech Therapy Frequency (ACUTE ONLY) min 2x/week Treatment Duration 1 week      CHL IP ORAL PHASE 06/19/2016 Oral Phase WFL Oral - Pudding Teaspoon -- Oral - Pudding Cup -- Oral - Honey Teaspoon -- Oral - Honey Cup -- Oral - Nectar  Teaspoon -- Oral - Nectar Cup WFL Oral - Nectar Straw WFL Oral - Thin Teaspoon -- Oral - Thin Cup WFL Oral - Thin Straw WFL Oral - Puree WFL Oral - Mech Soft WFL Oral - Regular WFL Oral - Multi-Consistency -- Oral - Pill -- Oral Phase - Comment --  CHL IP PHARYNGEAL PHASE 06/19/2016 Pharyngeal Phase Impaired Pharyngeal- Pudding Teaspoon -- Pharyngeal -- Pharyngeal- Pudding Cup -- Pharyngeal -- Pharyngeal- Honey Teaspoon -- Pharyngeal -- Pharyngeal- Honey Cup -- Pharyngeal -- Pharyngeal- Nectar Teaspoon -- Pharyngeal -- Pharyngeal- Nectar Cup Reduced pharyngeal peristalsis;Pharyngeal residue - valleculae;Reduced tongue base retraction Pharyngeal -- Pharyngeal- Nectar Straw Reduced pharyngeal peristalsis;Reduced tongue base retraction;Pharyngeal residue - valleculae Pharyngeal -- Pharyngeal- Thin Teaspoon -- Pharyngeal -- Pharyngeal- Thin Cup Reduced epiglottic inversion;Reduced tongue base retraction;Reduced pharyngeal peristalsis Pharyngeal -- Pharyngeal- Thin Straw Reduced pharyngeal peristalsis;Reduced epiglottic inversion;Pharyngeal residue - valleculae Pharyngeal -- Pharyngeal-  Puree Reduced epiglottic inversion;Reduced pharyngeal peristalsis;Pharyngeal residue - valleculae Pharyngeal -- Pharyngeal- Mechanical Soft -- Pharyngeal -- Pharyngeal- Regular Reduced pharyngeal peristalsis;Reduced epiglottic inversion;Reduced tongue base retraction;Pharyngeal residue - valleculae Pharyngeal -- Pharyngeal- Multi-consistency -- Pharyngeal -- Pharyngeal- Pill -- Pharyngeal -- Pharyngeal Comment pt without awareness to mild leftover vallecular residuals, he is able to clear/expectorate with cues   CHL IP CERVICAL ESOPHAGEAL PHASE 06/19/2016 Cervical Esophageal Phase WFL Pudding Teaspoon -- Pudding Cup -- Honey Teaspoon -- Honey Cup -- Nectar Teaspoon -- Nectar Cup -- Nectar Straw -- Thin Teaspoon -- Thin Cup -- Thin Straw -- Puree -- Mechanical Soft -- Regular -- Multi-consistency -- Pill -- Cervical Esophageal Comment -- No  flowsheet data found. Luanna Salk, MS [facility] SLP [phone]               Aquiles Ruffini, DO  [facility] Pager [phone]  If 7PM-7AM, please contact night-coverage [email or web address] Password [email or web address] 06/26/2016, 1:04 PM   LOS: 9 days

## 2016-06-26 NOTE — Care Management Important Message (Addendum)
Important Message  Patient Details IM Letter given to [name]/Case Manager to present to patient Name: ASHIR KUNZ MRN: [id number] Date of Birth: Nov 18, 1924   Medicare Important Message Given:  Yes    Kerin Salen 06/26/2016, 10:54 AMImportant Message  Patient Details  Name: NEERAJ HOUSAND MRN: [id number] Date of Birth: 04-Oct-1924   Medicare Important Message Given:  Yes    Kerin Salen 06/26/2016, 10:54 AM

## 2016-06-26 NOTE — Consult Note (Signed)
Referring Provider: [facility]    Primary Care Physician:  System, Pcp Not In Primary Gastroenterologist:  Unassigned  Reason for Consultation:  New anemia  1. 83 yo male with acute drop in hgb on IV heparin over last few days. Stool brown, heme negative. BUN is normal. No evidence for GI bleeding at this point.  - Recommend CTscan of abdomen to evaluate for RP bleed. If negative then can continue anticoagulation with close monitoriing of patient's labs and for signs of overt bleeding.  -repeat CBC now. -BID PPI, PO is fine    2. Sepsis / MSSA Enterococcus bacteremia / MSSA. Right prosthetic knee infection.   2. Bilateral DVTs, on IV heparin.   3. AFib, CHADSVASC 5. Previously not on AC  4. CKD 3, stable / DM / CAD wih prior stent placement.    ASSESSMENT AND PLAN:     HPI: Randy [name] is a 83 y.o. male with AFIB, s/p PPM, CAD with prior stent placement, DM, CKD 3 and mild dementia. He was admitted 4 days ago with sepsis from right prosthetic knee infection. Also found to have bilateral LE DVTs. Started on IV heparin with plans to transition ton PO apixaban tomorrow.  Patient noted today to have had a drop in hgb since admission from 12 >>11 >>> 10 >>> 8.6.  He denies abdominal pain but his knee hurts. No SOB or chest pain  Past Medical History:  Diagnosis Date  . Anxiety   . Arthritis    R knee,   . CKD (chronic kidney disease)   . Coronary artery disease   . Diabetes mellitus without complication ([place])   . Dysrhythmia    PAF;,sss s/p St. Jude PPM  . Hyperlipidemia   . Hypertension   . MI (myocardial infarction) ([place]) 2014  . Presence of permanent cardiac pacemaker     Past Surgical History:  Procedure Laterality Date  . CHOLECYSTECTOMY    . CORONARY ANGIOPLASTY     mLAD '13, RCA DES 03/2013  . EXCISIONAL TOTAL KNEE ARTHROPLASTY WITH ANTIBIOTIC SPACERS Right 04/20/2014   Procedure: IRRIGATION AND DEBRIDMENT RIGHT TOTAL KNEE REMOVE  ACL IMPLANTED AND PLACE  SPACER;  Surgeon: [name], MD;  Location: [place];  Service: Orthopedics;  Laterality: Right;  . HEMORROIDECTOMY    . KNEE SURGERY  2006  . PACEMAKER INSERTION    . PICC LINE PLACE PERIPHERAL ([place] HX)  04/2014   R upper arm   . TOTAL KNEE ARTHROPLASTY WITH REVISION COMPONENTS Right 06/11/2014   Procedure: TOTAL KNEE ARTHROPLASTY WITH REVISION COMPONENTS REMOVE SPACER PLACE TKA;  Surgeon: [name], MD;  Location: [place];  Service: Orthopedics;  Laterality: Right;    Prior to Admission medications   Not on File    Current Facility-Administered Medications  Medication Dose Route Frequency Provider Last Rate Last Dose  . 0.9 %  sodium chloride infusion   Intravenous Continuous Cristal Ford, DO 10 mL/hr at 06/24/16 0600    . 0.9 %  sodium chloride infusion   Intravenous Continuous Turner, Traci R, MD      . 0.9 % NaCl with KCl 20 mEq/ L  infusion   Intravenous Continuous Tat, David, MD 75 mL/hr at 06/26/16 1326    . Ampicillin-Sulbactam (UNASYN) 3 g in sodium chloride 0.9 % 100 mL IVPB  3 g Intravenous Q6H Luiz Ochoa, [facility]   Stopped at 06/26/16 9924  . chlorhexidine (PERIDEX) 0.12 % solution 15 mL  15 mL Mouth Rinse BID  Lily Kocher, MD   15 mL at 06/26/16 [phone]  . feeding supplement (BOOST / RESOURCE BREEZE) liquid 1 Container  1 Container Oral BID BM Mikhail, [place], DO   1 Container at 06/26/16 1325  . Gerhardt's butt cream   Topical TID Tat, David, MD      . heparin ADULT infusion 100 units/mL (25000 units/222mL sodium chloride 0.45%)  1,600 Units/hr Intravenous Continuous Dara Hoyer, RPH 16 mL/hr at 06/26/16 0119 1,600 Units/hr at 06/26/16 0119  . hydrALAZINE (APRESOLINE) injection 10 mg  10 mg Intravenous Q6H PRN Cristal Ford, DO      . insulin aspart (novoLOG) injection 0-20 Units  0-20 Units Subcutaneous TID WC Lily Kocher, MD   7 Units at 06/26/16 1309  . insulin aspart (novoLOG) injection 0-5 Units  0-5 Units Subcutaneous QHS Lily Kocher, MD   2 Units at  06/18/16 2200  . insulin aspart (novoLOG) injection 6 Units  6 Units Subcutaneous TID WC Tat, David, MD      . insulin detemir (LEVEMIR) injection 8 Units  8 Units Subcutaneous BID Tat, David, MD      . MEDLINE mouth rinse  15 mL Mouth Rinse q12n4p Lily Kocher, MD   15 mL at 06/26/16 1311  . pantoprazole (PROTONIX) EC tablet 40 mg  40 mg Oral BID AC Tat, David, MD      . sodium chloride flush (NS) 0.9 % injection 10-40 mL  10-40 mL Intracatheter Q12H Mikhail, [place], DO   10 mL at 06/26/16 3154  . sodium chloride flush (NS) 0.9 % injection 10-40 mL  10-40 mL Intracatheter PRN Cristal Ford, DO   10 mL at 06/25/16 0430    Allergies as of 06/17/2016  . (No Known Allergies)    Family History  Problem Relation Age of Onset  . Heart attack Mother   . Heart attack Father     Social History   Social History  . Marital status: Widowed    Spouse name: N/A  . Number of children: N/A  . Years of education: N/A   Occupational History  . Retired    Social History Main Topics  . Smoking status: Former Smoker    Quit date: 06/07/1983  . Smokeless tobacco: Never Used  . Alcohol use No  . Drug use: No  . Sexual activity: No   Other Topics Concern  . Not on file   Social History Narrative   Has 24/7 caregivers.    Review of Systems: All systems reviewed and negative except where noted in HPI.  Physical Exam: Vital signs in last 24 hours: Temp:  [97.9 F (36.6 C)-98.4 F (36.9 C)] 97.9 F (36.6 C) (05/11 1358) Pulse Rate:  [73-78] 73 (05/11 1358) Resp:  [16-18] 16 (05/11 1358) BP: (127-150)/(38-57) 127/38 (05/11 1358) SpO2:  [96 %-99 %] 99 % (05/11 1358) Last BM Date: 06/26/16 (rectal tube in place) General:   Alert, well-developed,  maite male in NAD Psych:  Pleasant, cooperative. Normal mood and affect. Eyes:  Pupils equal, sclera clear, no icterus.   Conjunctiva pink. Ears:  Normal auditory acuity. Nose:  No deformity, discharge,  or lesions. Neck:  Supple; no  masses Lungs:  Clear throughout to auscultation. Marland Kitchen  Heart:  Regular rate and rhythm; no murmurs, no edema Abdomen:  Soft, non-distended, nontender, BS active, no palp mass    Rectal:  Deferred . Rectal tube in place. Connected to drainage bag containing brown stool Msk:  Symmetrical without gross deformities. . Neurologic:  Alert and  oriented x4;  grossly normal neurologically. Skin:  Intact without significant lesions or rashes..   Intake/Output from previous day: 05/10 0701 - 05/11 0700 In: 1729 [P.O.:720; I.V.:609; IV Piggyback:400] Out: 1900 [Urine:1900] Intake/Output this shift: Total I/O In: 480 [P.O.:480] Out: 800 [Urine:400; Stool:400]  Lab Results:  Recent Labs  06/24/16 0348 06/26/16 0409  WBC 12.2* 13.8*  HGB 10.1* 8.6*  HCT 30.5* 25.7*  PLT 404* 527*   BMET  Recent Labs  06/24/16 0348 06/25/16 0435 06/26/16 0409  NA 138 138 139  K 3.6 3.4* 3.5  CL 110 110 108  CO2 21* 22 22  GLUCOSE 185* 140* 202*  BUN 17 15 13   CREATININE 1.69* 1.71* 1.80*  CALCIUM 7.8* 7.7* 7.9*    Tye Savoy, NP-C @  06/26/2016, 3:04 PM  Pager number [phone]  GI ATTENDING  History, laboratories, x-rays reviewed. Patient seen and examined as above. Nursing staff in room assisting patient. Agree with comprehensive consultation note as outlined above. We are asked to see the patient for possible GI bleed as his hemoglobin has drifted about 2 g. Question of melena raised by hospitalist. However, copious stool in bag is brown. BUN normal. Stool Hemoccult negative. Thus, no evidence for GI bleeding. We did place him on PPI empirically as he is on anticoagulation therapy to protect his upper GI mucosal moving forward. We recommended CT scan to rule out retroperitoneal hemorrhage. This was performed and returned negative for hemorrhage. We will repeat CBC to rule out spurious value. No further GI assessment warranted. The patient will continue care with primary service. Will sign  off. Thank you  Docia Chuck. Geri Seminole., M.D. [facility] Division of Gastroenterology

## 2016-06-26 NOTE — Progress Notes (Signed)
PATIENT ID: Randy [name]  MRN: [id number]  DOB/AGE:  1924-09-14 / 83 y.o.  4 Days Post-Op Procedure(s): aborted TEE    PROGRESS NOTE Subjective:   Patient is alert, oriented, no Nausea, no Vomiting, yes passing gas, yes Bowel Movement. Denies SOB, Chest or Calf Pain. Using [employer]. Non ambulatory, Patient reports pain as mild.    Objective: Vital signs in last 24 hours: Temp:  [98.2 F (36.8 C)-98.5 F (36.9 C)] 98.2 F (36.8 C) (05/11 0439) Pulse Rate:  [46-78] 78 (05/11 0439) Resp:  [18] 18 (05/11 0439) BP: (141-150)/(45-68) 150/57 (05/11 0439) SpO2:  [96 %-98 %] 98 % (05/11 0439)    Intake/Output from previous day: I/O last 3 completed shifts: In: 2519.8 [P.O.:720; I.V.:1099.8; IV Piggyback:700] Out: 3200 [Urine:3200]   Intake/Output this shift: Total I/O In: 240 [P.O.:240] Out: -    LABORATORY DATA:  Recent Labs  06/24/16 0348  06/25/16 0435  06/25/16 1636 06/25/16 2155 06/26/16 0409 06/26/16 0729  WBC 12.2*  --   --   --   --   --  13.8*  --   HGB 10.1*  --   --   --   --   --  8.6*  --   HCT 30.5*  --   --   --   --   --  25.7*  --   PLT 404*  --   --   --   --   --  527*  --   NA 138  --  138  --   --   --  139  --   K 3.6  --  3.4*  --   --   --  3.5  --   CL 110  --  110  --   --   --  108  --   CO2 21*  --  22  --   --   --  22  --   BUN 17  --  15  --   --   --  13  --   CREATININE 1.69*  --  1.71*  --   --   --  1.80*  --   GLUCOSE 185*  --  140*  --   --   --  202*  --   GLUCAP  --   < >  --   < > 225* 165*  --  178*  CALCIUM 7.8*  --  7.7*  --   --   --  7.9*  --   < > = values in this interval not displayed.  Examination: Neurologically intact Sensation intact distally Dorsiflexion/Plantar flexion intact Pt does continue with the significant lower extremity edema.  He has a moderate effusion on the right knee.  Motion continues to be from 15-45}  Assessment:   4 Days Post-Op Procedure(s): aborted TEE ADDITIONAL DIAGNOSIS:      Plan:  Knee was aspirated again today obtaining 35 cc of blood tinged purulent fluid.  Pt tolerated the procedure fine with out complication.  We will continue to monitor knee and may have to place a percutaneous drain at the bedside if he continues to accumulate fluid in the knee.  Because of the full dose heparin anticoagulation.  He is not a candidate for AKA at this time and would probably not survive surgery.  Recommend palliative care at this point.     Jessicamarie Amiri R 06/26/2016, 9:27 AM

## 2016-06-26 NOTE — Progress Notes (Addendum)
ANTICOAGULATION CONSULT NOTE -   Pharmacy Consult for heparin Indication: DVT  No Known Allergies  Patient Measurements: Height: 5\' 11"  (180.3 cm) Weight: 212 lb 4.9 oz (96.3 kg) IBW/kg (Calculated) : 75.3 Heparin Dosing Weight: 94 kg  Vital Signs: Temp: 98.2 F (36.8 C) (05/11 0439) Temp Source: Oral (05/11 0439) BP: 150/57 (05/11 0439) Pulse Rate: 78 (05/11 0439)  Labs:  Recent Labs  06/24/16 0348  06/25/16 0435 06/25/16 1559 06/26/16 0409 06/26/16 1226  HGB 10.1*  --   --   --  8.6*  --   HCT 30.5*  --   --   --  25.7*  --   PLT 404*  --   --   --  527*  --   HEPARINUNFRC  --   < > 0.32 0.25* 0.33 0.38  CREATININE 1.69*  --  1.71*  --  1.80*  --   < > = values in this interval not displayed.  Estimated Creatinine Clearance: 32.3 mL/min (A) (by C-G formula based on SCr of 1.8 mg/dL (H)).   Medical History: Past Medical History:  Diagnosis Date  . Anxiety   . Arthritis    R knee,   . CKD (chronic kidney disease)   . Coronary artery disease   . Diabetes mellitus without complication ([place])   . Dysrhythmia    PAF;,sss s/p St. Jude PPM  . Hyperlipidemia   . Hypertension   . MI (myocardial infarction) ([place]) 2014  . Presence of permanent cardiac pacemaker     Assessment: 56 yoM admitted for sepsis secondary to MSSA/Enterococcus bacteremia, recurrent right prosthetic knee infection, groin cellulitis.  Bilateral lower extremity venous duplex completed today shows bilateral acute DVTs. Pt was initially on prophylactic enoxaparin.  Pharmacy consulted to start heparin.  Today, 06/26/2016: - Repeat Heparin level remains therapeutic at 0.38 - hgb down 8.6, plt high at 527 - s/p knee aspiration today - plan to transition to Eliquis when no further intervention is planned for pt   Goal of Therapy:  Heparin level 0.3-0.7 units/ml Monitor platelets by anticoagulation protocol: Yes   Plan:  - continue heparin infusion at 1600 units/hr. - daily heparin level -  monitor for s/s bleeding - F/u plans for transition to Eliquis.  Dia Sitter, PharmD, BCPS 06/26/2016 1:04 PM

## 2016-06-26 NOTE — Progress Notes (Signed)
  Speech Language Pathology Treatment: Dysphagia  Patient Details Name: Randy [name] MRN: [id number] DOB: 1924-12-21 Today's Date: 06/26/2016 Time: 3419-3790 SLP Time Calculation (min) (ACUTE ONLY): 21 min  Assessment / Plan / Recommendation Clinical Impression  MD desires possible upgrade from liquid diet. SLP reviewed pt's chart and swallow status since initial evaluation. Suspect pharyngeal phase of swallow has improved somewhat with decreased pharyngeal residue compared to MBS performed 5/4 although difficult to fully appreciate at bedside. No penetration or aspiration during prior MBS. Today he was observed with thin liquids via cup, applesauce and graham cracker with 1 reports of mild globus sensation. No cough, throat clear or wet vocal quality which has been occurring frequently. He was unable to recall swallow precautions and stated he had not been rotating head to left during swallows or "hocking" after swallows. Intermittent audible and second swallows noted x 2. Discussed pharyngeal retention during MBS and reiterated importance of dysphagia precautions (am not recommending now that he rotate head). Recommend upgrade to Dys 2 (ground-minced) and continue thin liquids, no straws, multiple swallows, sit upright, alternate liquids after every 2 bites solid, and intermittent cough. ST will continue to follow.    HPI HPI: pt admit to [facility] with AMS, cough- found to be bacterial septic.  PMH + for DM, MI, no CVA hx. CT chest and CXR negative.  Swallow eval ordered due to pt having dysphagia.       SLP Plan  Continue with current plan of care       Recommendations  Diet recommendations: Dysphagia 2 (fine chop);Thin liquid Liquids provided via: Cup Medication Administration: Crushed with puree Supervision: Full supervision/cueing for compensatory strategies Compensations: Slow rate;Small sips/bites;Multiple dry swallows after each bite/sip;Follow solids with liquid Postural Changes  and/or Swallow Maneuvers: Seated upright 90 degrees;Upright 30-60 min after meal                Oral Care Recommendations: Oral care BID Follow up Recommendations: None SLP Visit Diagnosis: Dysphagia, oropharyngeal phase (R13.12) Plan: Continue with current plan of care                      Houston Siren 06/26/2016, 4:06 PM  Orbie Pyo Colvin Caroli.[employer] [phone]

## 2016-06-26 NOTE — Care Management Note (Signed)
Case Management Note  Patient Details  Name: TYRIEK HOFMAN MRN: [id number] Date of Birth: November 02, 1924  Subjective/Objective:  [facility] already following-rep Pam-iv infusion, HHPT,HHRN-[facility] rep Jerm[place] following. Patient has flexiseal-[facility] cannot manage a flexiseal-MD notified.                 Action/Plan:d/c plan home w/HHC.   Expected Discharge Date:                  Expected Discharge Plan:  [place]  In-House Referral:     Discharge planning Services  CM Consult  Post Acute Care Choice:    Choice offered to:     DME Arranged:    DME Agency:     HH Arranged:    [place] Agency:     Status of Service:  In process, will continue to follow  If discussed at Long Length of Stay Meetings, dates discussed:    Additional Comments:  Dessa Phi, RN 06/26/2016, 2:20 PM

## 2016-06-26 NOTE — Progress Notes (Addendum)
ANTICOAGULATION CONSULT NOTE - Follow Up Consult  Pharmacy Consult for Heparin Indication: DVT  No Known Allergies  Patient Measurements: Height: 5\' 11"  (180.3 cm) Weight: 212 lb 4.9 oz (96.3 kg) IBW/kg (Calculated) : 75.3 Heparin Dosing Weight:   Vital Signs: Temp: 98.2 F (36.8 C) (05/11 0439) Temp Source: Oral (05/11 0439) BP: 150/57 (05/11 0439) Pulse Rate: 78 (05/11 0439)  Labs:  Recent Labs  06/24/16 0348  06/25/16 0435 06/25/16 1559 06/26/16 0409  HGB 10.1*  --   --   --  8.6*  HCT 30.5*  --   --   --  25.7*  PLT 404*  --   --   --  527*  HEPARINUNFRC  --   < > 0.32 0.25* 0.33  CREATININE 1.69*  --  1.71*  --  1.80*  < > = values in this interval not displayed.  Estimated Creatinine Clearance: 32.3 mL/min (A) (by C-G formula based on SCr of 1.8 mg/dL (H)).   Medications:  Infusions:  . sodium chloride 10 mL/hr at 06/24/16 0600  . sodium chloride    . ampicillin-sulbactam (UNASYN) IV 3 g (06/26/16 0405)  . heparin 1,600 Units/hr (06/26/16 0119)    Assessment: Patient with heparin level at goal.  No heparin issues noted.  Goal of Therapy:  Heparin level 0.3-0.7 units/ml Monitor platelets by anticoagulation protocol: Yes   Plan:  Continue heparin drip at current rate Recheck level at [place], [place] 06/26/2016,6:51 AM

## 2016-06-26 NOTE — Progress Notes (Signed)
PT Cancellation Note  Patient Details Name: NIKKI RUSNAK MRN: [id number] DOB: Jul 01, 1924   Cancelled Treatment:     pt working with [place] currently.  Will attempt another day as schedule permits.    Rica Koyanagi  PTA [facility]  Acute  Rehab Pager      [phone]

## 2016-06-27 DIAGNOSIS — E1165 Type 2 diabetes mellitus with hyperglycemia: Secondary | ICD-10-CM

## 2016-06-27 DIAGNOSIS — D62 Acute posthemorrhagic anemia: Secondary | ICD-10-CM

## 2016-06-27 LAB — URINALYSIS, COMPLETE (UACMP) WITH MICROSCOPIC
BACTERIA UA: NONE SEEN
Bilirubin Urine: NEGATIVE
GLUCOSE, UA: 150 mg/dL — AB
Hgb urine dipstick: NEGATIVE
KETONES UR: NEGATIVE mg/dL
Leukocytes, UA: NEGATIVE
Nitrite: NEGATIVE
PROTEIN: NEGATIVE mg/dL
Specific Gravity, Urine: 1.008 (ref 1.005–1.030)
pH: 6 (ref 5.0–8.0)

## 2016-06-27 LAB — GLUCOSE, CAPILLARY
GLUCOSE-CAPILLARY: 183 mg/dL — AB (ref 65–99)
GLUCOSE-CAPILLARY: 202 mg/dL — AB (ref 65–99)
Glucose-Capillary: 167 mg/dL — ABNORMAL HIGH (ref 65–99)
Glucose-Capillary: 217 mg/dL — ABNORMAL HIGH (ref 65–99)

## 2016-06-27 LAB — CBC
HCT: 24.1 % — ABNORMAL LOW (ref 39.0–52.0)
HEMATOCRIT: 23.5 % — AB (ref 39.0–52.0)
HEMOGLOBIN: 7.7 g/dL — AB (ref 13.0–17.0)
Hemoglobin: 8 g/dL — ABNORMAL LOW (ref 13.0–17.0)
MCH: 28 pg (ref 26.0–34.0)
MCH: 27.7 pg (ref 26.0–34.0)
MCHC: 33.2 g/dL (ref 30.0–36.0)
MCHC: 32.8 g/dL (ref 30.0–36.0)
MCV: 84.3 fL (ref 78.0–100.0)
MCV: 84.5 fL (ref 78.0–100.0)
PLATELETS: 507 10*3/uL — AB (ref 150–400)
Platelets: 523 10*3/uL — ABNORMAL HIGH (ref 150–400)
RBC: 2.86 MIL/uL — AB (ref 4.22–5.81)
RBC: 2.78 MIL/uL — ABNORMAL LOW (ref 4.22–5.81)
RDW: 16.8 % — AB (ref 11.5–15.5)
RDW: 16.9 % — ABNORMAL HIGH (ref 11.5–15.5)
WBC: 15.1 10*3/uL — AB (ref 4.0–10.5)
WBC: 16 10*3/uL — ABNORMAL HIGH (ref 4.0–10.5)

## 2016-06-27 LAB — COMPREHENSIVE METABOLIC PANEL
ALT: 6 U/L — ABNORMAL LOW (ref 17–63)
AST: 14 U/L — ABNORMAL LOW (ref 15–41)
Albumin: 1.9 g/dL — ABNORMAL LOW (ref 3.5–5.0)
Alkaline Phosphatase: 52 U/L (ref 38–126)
Anion gap: 7 (ref 5–15)
BUN: 11 mg/dL (ref 6–20)
CALCIUM: 7.9 mg/dL — AB (ref 8.9–10.3)
CHLORIDE: 107 mmol/L (ref 101–111)
CO2: 24 mmol/L (ref 22–32)
CREATININE: 1.89 mg/dL — AB (ref 0.61–1.24)
GFR, EST AFRICAN AMERICAN: 34 mL/min — AB (ref >60)
GFR, EST NON AFRICAN AMERICAN: 30 mL/min — AB (ref >60)
Glucose, Bld: 224 mg/dL — ABNORMAL HIGH (ref 65–99)
POTASSIUM: 3.5 mmol/L (ref 3.5–5.1)
SODIUM: 138 mmol/L (ref 135–145)
TOTAL PROTEIN: 5.8 g/dL — AB (ref 6.5–8.1)
Total Bilirubin: 1.1 mg/dL (ref 0.3–1.2)

## 2016-06-27 LAB — FERRITIN: FERRITIN: 896 ng/mL — AB (ref 24–336)

## 2016-06-27 LAB — FOLATE: Folate: 7.6 ng/mL (ref >5.9)

## 2016-06-27 LAB — VITAMIN B12: Vitamin B-12: 505 pg/mL (ref 180–914)

## 2016-06-27 LAB — IRON AND TIBC
Iron: 78 ug/dL (ref 45–182)
Saturation Ratios: 48 % — ABNORMAL HIGH (ref 17.9–39.5)
TIBC: 161 ug/dL — AB (ref 250–450)
UIBC: 83 ug/dL

## 2016-06-27 LAB — LACTATE DEHYDROGENASE: LDH: 181 U/L (ref 98–192)

## 2016-06-27 LAB — HEPARIN LEVEL (UNFRACTIONATED): Heparin Unfractionated: 0.43 IU/mL (ref 0.30–0.70)

## 2016-06-27 MED ORDER — APIXABAN 5 MG PO TABS: 5.0000 mg | ORAL_TABLET | Freq: Two times a day (BID) | ORAL | Status: DC

## 2016-06-27 MED ORDER — INSULIN ASPART 100 UNIT/ML ~~LOC~~ SOLN
8.0000 [IU] | Freq: Three times a day (TID) | SUBCUTANEOUS | Status: DC
  Administered 2016-06-27 – 2016-06-28 (×3): 8 [IU] via SUBCUTANEOUS

## 2016-06-27 MED ORDER — FUROSEMIDE 10 MG/ML IJ SOLN
40.0000 mg | Freq: Once | INTRAMUSCULAR | Status: AC
  Administered 2016-06-27: 40 mg via INTRAVENOUS
  Filled 2016-06-27: qty 4

## 2016-06-27 MED ORDER — APIXABAN 5 MG PO TABS
10.0000 mg | ORAL_TABLET | Freq: Two times a day (BID) | ORAL | Status: DC
  Administered 2016-06-27 – 2016-07-02 (×10): 10 mg via ORAL
  Filled 2016-06-27 (×10): qty 2

## 2016-06-27 MED ORDER — CHLORHEXIDINE GLUCONATE CLOTH 2 % EX PADS
6.0000 | MEDICATED_PAD | Freq: Every day | CUTANEOUS | Status: AC
  Administered 2016-06-27 – 2016-07-01 (×5): 6 via TOPICAL

## 2016-06-27 MED ORDER — MUPIROCIN 2 % EX OINT
1.0000 | TOPICAL_OINTMENT | Freq: Two times a day (BID) | CUTANEOUS | Status: AC
Start: 2016-06-27 — End: 2016-07-01
  Administered 2016-06-27 – 2016-07-01 (×10): 1 via NASAL
  Filled 2016-06-27 (×2): qty 22

## 2016-06-27 NOTE — Progress Notes (Signed)
PROGRESS NOTE  Randy [name] TOI:[id number] DOB: 02-24-1924 DOA: 06/17/2016 PCP: System, Pcp Not In  Brief History: 83 y.o.gentleman with a history of mild dementia/cognitive impairment, CAD with prior stent placement, history of atrial fibrillation S/P PPM implant (CHADS-Vasc score of at least four, no anticoagulation), HTN, HLD, DM, and CKD 3 with baseline creatinine around 1.3-1.5 who was referred to the ED via EMS by one of his caregivers for evaluation of increased lethargy, mental status changes, and cough. The patient is nonambulatory at baseline and spends most of his day sitting up a recliner. He has caregiver support 24 hours/day. He has had cough for 4-5 days. He has had two known sick contacts (son-in-law and caregiver). He has had yellow sputum. No chest pain or shortness of breath. No nausea or vomiting/diarrhea. No abdominal pain. No dysuria/hematuria On 06/17/16, he was noted to have increased lethargy and muffled speech with slurring. No other focal weakenss. 911 was called and the patient was transferred to the ED for evaluation. No recent hospitalizations. No recent antibiotics. He has been using OTC mucinex for his symptoms.  Assessment/Plan: Sepsis secondary to MSSA/Enterococcus bacteremia/MSSA Right prosthetic knee infection -Secondary to bacteremia and prosthetic knee infection -Presented with fever, tachycardia, tachypnea, leukocytosis -Patient also had elevated pro calcitonin level as well as lactic acid -Initially placed on vancomycin and zosyn, and transitioned to Unasyn -Chest x-ray, UA unremarkable for infection  -CT chest showed subpleural fibrosis, no acute pulmonary infiltrates. -Influenza PCR, strep pneumonia urine antigen negative -Blood cultures 06/17/2016: MSSA, enterococcus  -sepsis physiology resolved  MSSA/Enterococcus bacteremia/MSSA Right prosthetic knee infection -Blood cultures 06/17/2016: MSSA, E faecalis -Repeat blood  cultures 06/19/16 no growth to date -Echocardiogram (TTE)showed EF 55-60%, moderate AS, trivial MR/TR/AI -Infectious disease consulted and appreciated, recommended TEE and EP consult -Cardiology consulted and appreciated- TEE attempted multiple times on 06/22/16-  -Per cardiology, patient is at high risk of complication due to high risk comorbidities. No plan for device extraction at this time. -Orthopedic surgery consulted and appreciated for infected knee. S/p aspiration x2-->grew MSSA -Patient seen by Dr. Mayer Camel (ortho), patient would need an AKA, however patient seen as a poor surgical candidate. Ortho has discussed this with the patient's son -Patient will need IV unasyn for 6 weeks, and followed by PO amox/clavfor suppression thereafter. -06/22/16 PICC line -06/26/16--case discussed with ortho--no plans for surgery--monitor for serial knee aspirations, f/u office in one week after d/c; repeated R-knee aspiration 5/11 -06/27/16--repeat blood culture as WBC trending up  Right and Leftlower extremity DVT -clinically provoked, but son states that pt has had 2 or 3 previous episodes of DVTs in past -son unclear why pt is not on chronic anticoagulation -LE doppler--acute DVT Right common femoral, femoral, and popliteal veinsand Left common femoral and femoral vein -startedIV heparin -transition to po apixaban on 06/27/16 if no further plans for any surgical or invasive interventions  Dysphagia -Speech consulted and recommended FULL liquids due to abnormal MBS results -will ask speech to re-evaluation again-->Dys 2 diet -son states pt was on regular diet prior to admission but did require food to be chopped up -discussed with son--he does not want to restrict diet, but does not seem to full accept or fully understand the risk of aspiration--"he's always eaten solid food without problems"  Acute Blood Loss Anemia -drop ~4 grams Hgb in past 7 days -FOBT--neg -GI consult appreciated-->bid  PPI -CT abd/pelvis-neg for retroperitoneal bleed -LDH--no suggestive of hemolysis -repeat CBC in  am  Anasarca/Fluid Excess -lasix 40 mg IV x 1  Acute on Chronic kidney disease, stage III -Creatinine baseline at 1.3-1.6 -Serum creatinine peaked 2.03 -Creatinine trending back up- -Am BMP  Diarrhea -check Cdiff--negative -continue Flexiseal  Dermatophytosis/tinea cruris -Groin area appears to be erythematous and flaky -discontinuediflucan--pt finished one week -continue mycostatin topical -suspect pt has component of incontinence dermatitis -improved with condom cath  Essential hypertension -Currently on no home medications, continue IV hydralazine PRN  Diabetes mellitus, type II -Currently on no home medications -Continue insulin sliding scale, Levemir, CBG monitoring -06/25/16 check A1C--11.5 -increase novolog 8 units with meals -increase levemir to 8 units bid  History of atrial fibrillation -CHADSVASC 5 (based on age, DM, HTN, CAD) -previously not on [facility] -Status post pacemaker placement- cardiology consulted as patient does have bacteremia, as above, no plan for device extraction at this time.   Stage II pressure injury -moisture related -wound care consulted and appreciated  Moderate aortic stenosis -Noted on echocardiogram  Cognitive impairment -Per son patient can sometimes be clear minded but is not always  Hypokalemia -K 3.0, continue to replete -Monitor BMP -mag= 1.8  Hypomagnesemia -Magnesium 1.8, continue to monitor and replace as needed  Goals of care -Had discuss with son via phone regarding patient, current treatment and plan, code status, mental status. Per son, patient's mental status is not always clear and at this time he would like to remain full code and possible have surgeries that might be needed. -Cardio and ortho recommend palliative care consult. Discussed with son, does not feel palliative care consult is needed right  now.  DVT Prophylaxislovenox  Code Status:Full  Family Communication:updated son on phone 5/10  Disposition Plan:Home5/12 or 5/13if cleared by consultants--son refuses SNF 06/26/16--Total time spent 35 minutes. Greater than 50% spent face to face counseling and coordinating care.   Consultants Infectious disease Cardiology Orthopedics  Procedures  Echocardiogram Right knee aspiration x2 Attempted TEE  Antibiotics vanco 5/2>>> Unasyn 5/7>>> Nafcillin 5/3>>5/7     Subjective: Patient denies fevers, chills, headache, chest pain, dyspnea, nausea, vomiting, diarrhea, abdominal pain, dysuria, hematuria, hematochezia, and melena. Patient complains of right knee pain. He states that it is about the same as last several days but better than when compared to the time of admission.  Objective: Vitals:   06/26/16 1358 06/26/16 2037 06/27/16 0529 06/27/16 1300  BP: (!) 127/38 117/61 (!) 156/45 (!) 145/66  Pulse: 73 67 73 81  Resp: 16 (!) 24 (!) 22   Temp: 97.9 F (36.6 C) 98 F (36.7 C) 97.9 F (36.6 C) 98.3 F (36.8 C)  TempSrc: Oral Oral Oral Oral  SpO2: 99% 99% 98% 99%  Weight:      Height:        Intake/Output Summary (Last 24 hours) at 06/27/16 1803 Last data filed at 06/27/16 1752  Gross per 24 hour  Intake             1952 ml  Output             1700 ml  Net              252 ml   Weight change:  Exam:   General:  Pt is alert, follows commands appropriately, not in acute distress  HEENT: No icterus, No thrush, No neck mass, [place]/AT  Cardiovascular: RRR, S1/S2, no rubs, no gallops  Respiratory: Bibasilar crackles. No wheezing. Good air movement.  Abdomen: Soft/+BS, non tender, non distended, no guarding  Extremities: bilateral LE edema,  No lymphangitis, No petechiae, No rashes, +R-knee synovitis   Data Reviewed: I have personally reviewed following labs and imaging studies Basic Metabolic Panel:  Recent Labs Lab 06/22/16 0352  06/23/16 0331 06/23/16 0349 06/24/16 0348 06/25/16 0435 06/26/16 0409 06/27/16 0546  NA 139 139  --  138 138 139 138  K 3.2* 3.0*  --  3.6 3.4* 3.5 3.5  CL 111 112*  --  110 110 108 107  CO2 20* 21*  --  21* 22 22 24   GLUCOSE 123* 119*  --  185* 140* 202* 224*  BUN 18 16  --  17 15 13 11   CREATININE 1.83* 1.89*  --  1.69* 1.71* 1.80* 1.89*  CALCIUM 7.2* 7.5*  --  7.8* 7.7* 7.9* 7.9*  MG 1.6*  --  1.9  --  1.8  --   --   PHOS 2.0*  --   --   --   --   --   --    Liver Function Tests:  Recent Labs Lab 06/27/16 0546  AST 14*  ALT 6*  ALKPHOS 52  BILITOT 1.1  PROT 5.8*  ALBUMIN 1.9*   No results for input(s): LIPASE, AMYLASE in the last 168 hours. No results for input(s): AMMONIA in the last 168 hours. Coagulation Profile: No results for input(s): INR, PROTIME in the last 168 hours. CBC:  Recent Labs Lab 06/24/16 0348 06/26/16 0409 06/26/16 1629 06/27/16 0542 06/27/16 1158  WBC 12.2* 13.8* 14.7* 15.1* 16.0*  NEUTROABS  --  8.4*  --   --   --   HGB 10.1* 8.6* 8.0* 8.0* 7.7*  HCT 30.5* 25.7* 24.6* 24.1* 23.5*  MCV 83.8 84.5 83.7 84.3 84.5  PLT 404* 527* 514* 507* 523*   Cardiac Enzymes: No results for input(s): CKTOTAL, CKMB, CKMBINDEX, TROPONINI in the last 168 hours. BNP: Invalid input(s): POCBNP CBG:  Recent Labs Lab 06/26/16 1639 06/26/16 2128 06/27/16 0737 06/27/16 1130 06/27/16 1639  GLUCAP 149* 183* 167* 217* 183*   HbA1C:  Recent Labs  06/25/16 0435  HGBA1C 11.5*   Urine analysis:    Component Value Date/Time   COLORURINE STRAW (A) 06/27/2016 1541   APPEARANCEUR CLEAR 06/27/2016 1541   LABSPEC 1.008 06/27/2016 1541   PHURINE 6.0 06/27/2016 1541   GLUCOSEU 150 (A) 06/27/2016 1541   HGBUR NEGATIVE 06/27/2016 1541   BILIRUBINUR NEGATIVE 06/27/2016 1541   KETONESUR NEGATIVE 06/27/2016 1541   PROTEINUR NEGATIVE 06/27/2016 1541   UROBILINOGEN 0.2 05/18/2014 1634   NITRITE NEGATIVE 06/27/2016 1541   LEUKOCYTESUR NEGATIVE 06/27/2016 1541    Sepsis Labs: @LABRCNTIP (procalcitonin:4,lacticidven:4) ) Recent Results (from the past 240 hour(s))  Blood Culture (routine x 2)     Status: Abnormal   Collection Time: 06/17/16  7:52 PM  Result Value Ref Range Status   Specimen Description BLOOD BLOOD LEFT FOREARM  Final   Special Requests IN PEDIATRIC BOTTLE Blood Culture adequate volume  Final   Culture  Setup Time   Final    GRAM POSITIVE COCCI IN CLUSTERS IN PEDIATRIC BOTTLE CRITICAL VALUE NOTED.  VALUE IS CONSISTENT WITH PREVIOUSLY REPORTED AND CALLED VALUE.    Culture (A)  Final    STAPHYLOCOCCUS SPECIES (COAGULASE NEGATIVE) THE SIGNIFICANCE OF ISOLATING THIS ORGANISM FROM A SINGLE SET OF BLOOD CULTURES WHEN MULTIPLE SETS ARE DRAWN IS UNCERTAIN. PLEASE NOTIFY THE MICROBIOLOGY DEPARTMENT WITHIN ONE WEEK IF SPECIATION AND SENSITIVITIES ARE REQUIRED. Performed at [facility], [place] [address] [place], [place] [address]    Report Status  06/19/2016 FINAL  Final  Blood Culture (routine x 2)     Status: Abnormal   Collection Time: 06/17/16  8:12 PM  Result Value Ref Range Status   Specimen Description BLOOD RIGHT ANTECUBITAL  Final   Special Requests   Final    BOTTLES DRAWN AEROBIC AND ANAEROBIC Blood Culture results may not be optimal due to an inadequate volume of blood received in culture bottles   Culture  Setup Time   Final    GRAM POSITIVE COCCI IN CHAINS IN BOTH AEROBIC AND ANAEROBIC BOTTLES CRITICAL RESULT CALLED TO, READ BACK BY AND VERIFIED WITH: N. Glogovac Pharm.D. 15:45 06/18/16 ([email or web address]) Performed at [facility], [place] [address], [place], [place] [address]    Culture STAPHYLOCOCCUS AUREUS ENTEROCOCCUS FAECALIS  (A)  Final   Report Status 06/21/2016 FINAL  Final   Organism ID, Bacteria STAPHYLOCOCCUS AUREUS  Final   Organism ID, Bacteria ENTEROCOCCUS FAECALIS  Final      Susceptibility   Enterococcus faecalis - MIC*    AMPICILLIN <=2 SENSITIVE Sensitive     VANCOMYCIN <=0.5 SENSITIVE Sensitive      GENTAMICIN SYNERGY SENSITIVE Sensitive     * ENTEROCOCCUS FAECALIS   Staphylococcus aureus - MIC*    CIPROFLOXACIN <=0.5 SENSITIVE Sensitive     ERYTHROMYCIN <=0.25 SENSITIVE Sensitive     GENTAMICIN <=0.5 SENSITIVE Sensitive     OXACILLIN <=0.25 SENSITIVE Sensitive     TETRACYCLINE <=1 SENSITIVE Sensitive     VANCOMYCIN <=0.5 SENSITIVE Sensitive     TRIMETH/SULFA <=10 SENSITIVE Sensitive     CLINDAMYCIN <=0.25 SENSITIVE Sensitive     RIFAMPIN <=0.5 SENSITIVE Sensitive     Inducible Clindamycin NEGATIVE Sensitive     * STAPHYLOCOCCUS AUREUS  Blood Culture ID Panel (Reflexed)     Status: Abnormal   Collection Time: 06/17/16  8:12 PM  Result Value Ref Range Status   Enterococcus species DETECTED (A) NOT DETECTED Final    Comment: CRITICAL RESULT CALLED TO, READ BACK BY AND VERIFIED WITH: N. Glogovac Pharm.D. 15:45 06/18/16 ([email or web address])    Vancomycin resistance NOT DETECTED NOT DETECTED Final   Listeria monocytogenes NOT DETECTED NOT DETECTED Final   Staphylococcus species DETECTED (A) NOT DETECTED Final    Comment: CRITICAL RESULT CALLED TO, READ BACK BY AND VERIFIED WITH: N. Glogovac Pharm.D. 15:45 06/18/16 (wilsonm)    Staphylococcus aureus DETECTED (A) NOT DETECTED Final    Comment: Methicillin (oxacillin) susceptible Staphylococcus aureus (MSSA). Preferred therapy is anti staphylococcal beta lactam antibiotic (Cefazolin or Nafcillin), unless clinically contraindicated. CRITICAL RESULT CALLED TO, READ BACK BY AND VERIFIED WITH: N. Glogovac Pharm.D. 15:45 06/18/16 ([email or web address])    Methicillin resistance NOT DETECTED NOT DETECTED Final   Streptococcus species NOT DETECTED NOT DETECTED Final   Streptococcus agalactiae NOT DETECTED NOT DETECTED Final   Streptococcus pneumoniae NOT DETECTED NOT DETECTED Final   Streptococcus pyogenes NOT DETECTED NOT DETECTED Final   Acinetobacter baumannii NOT DETECTED NOT DETECTED Final   Enterobacteriaceae species NOT DETECTED NOT DETECTED Final    Enterobacter cloacae complex NOT DETECTED NOT DETECTED Final   Escherichia coli NOT DETECTED NOT DETECTED Final   Klebsiella oxytoca NOT DETECTED NOT DETECTED Final   Klebsiella pneumoniae NOT DETECTED NOT DETECTED Final   Proteus species NOT DETECTED NOT DETECTED Final   Serratia marcescens NOT DETECTED NOT DETECTED Final   Haemophilus influenzae NOT DETECTED NOT DETECTED Final   Neisseria meningitidis NOT DETECTED NOT DETECTED Final   Pseudomonas aeruginosa NOT DETECTED NOT DETECTED Final   Candida  albicans NOT DETECTED NOT DETECTED Final   Candida glabrata NOT DETECTED NOT DETECTED Final   Candida krusei NOT DETECTED NOT DETECTED Final   Candida parapsilosis NOT DETECTED NOT DETECTED Final   Candida tropicalis NOT DETECTED NOT DETECTED Final    Comment: Performed at [facility], [place] [address] [place] [address], [place], [place] [address]  MRSA PCR Screening     Status: Abnormal   Collection Time: 06/18/16  1:40 AM  Result Value Ref Range Status   MRSA by PCR POSITIVE (A) NEGATIVE Final    Comment:        The GeneXpert MRSA Assay (FDA approved for NASAL specimens only), is one component of a comprehensive MRSA colonization surveillance program. It is not intended to diagnose MRSA infection nor to guide or monitor treatment for MRSA infections. RESULT CALLED TO, READ BACK BY AND VERIFIED WITH: C.SMITH,RN [id number] 06/18/16 W.SHEA   Culture, respiratory (NON-Expectorated)     Status: None   Collection Time: 06/18/16  6:32 AM  Result Value Ref Range Status   Specimen Description TRACHEAL ASPIRATE  Final   Special Requests NONE  Final   Gram Stain   Final    ABUNDANT WBC PRESENT, PREDOMINANTLY PMN RARE SQUAMOUS EPITHELIAL CELLS PRESENT MODERATE GRAM POSITIVE RODS GRAM POSITIVE COCCI IN PAIRS    Culture   Final    Consistent with normal respiratory flora. Performed at [facility], [place] [address], [place], [place] [address]    Report Status 06/20/2016 FINAL  Final  Culture, blood  (Routine X 2) w Reflex to ID Panel     Status: None   Collection Time: 06/18/16  6:16 PM  Result Value Ref Range Status   Specimen Description BLOOD RIGHT ARM  Final   Special Requests IN PEDIATRIC BOTTLE Blood Culture adequate volume  Final   Culture   Final    NO GROWTH 5 DAYS Performed at [facility], [place] [address], [place], [place] [address]    Report Status 06/23/2016 FINAL  Final  Culture, blood (Routine X 2) w Reflex to ID Panel     Status: Abnormal   Collection Time: 06/18/16  6:22 PM  Result Value Ref Range Status   Specimen Description BLOOD RIGHT ANTECUBITAL  Final   Special Requests IN PEDIATRIC BOTTLE Blood Culture adequate volume  Final   Culture  Setup Time   Final    GRAM POSITIVE COCCI IN CLUSTERS IN PEDIATRIC BOTTLE CRITICAL RESULT CALLED TO, READ BACK BY AND VERIFIED WITH: D ZEIGLER PHARMD 1932 06/19/16 A BROWNING    Culture (A)  Final    STAPHYLOCOCCUS AUREUS SUSCEPTIBILITIES PERFORMED ON PREVIOUS CULTURE WITHIN THE LAST 5 DAYS. Performed at [facility], [place] [address] [place], [place] [address]    Report Status 06/21/2016 FINAL  Final  Blood Culture ID Panel (Reflexed)     Status: Abnormal   Collection Time: 06/18/16  6:22 PM  Result Value Ref Range Status   Enterococcus species NOT DETECTED NOT DETECTED Final   Listeria monocytogenes NOT DETECTED NOT DETECTED Final   Staphylococcus species DETECTED (A) NOT DETECTED Final    Comment: CRITICAL RESULT CALLED TO, READ BACK BY AND VERIFIED WITHMarcy Siren PHARMD 1932 06/19/16 A BROWNING    Staphylococcus aureus DETECTED (A) NOT DETECTED Final    Comment: Methicillin (oxacillin) susceptible Staphylococcus aureus (MSSA). Preferred therapy is anti staphylococcal beta lactam antibiotic (Cefazolin or Nafcillin), unless clinically contraindicated. CRITICAL RESULT CALLED TO, READ BACK BY AND VERIFIED WITH:  Marcy Siren PHARMD 1932 06/19/16 A BROWNING    Methicillin resistance NOT DETECTED NOT DETECTED Final    Streptococcus species NOT DETECTED NOT DETECTED Final   Streptococcus agalactiae NOT DETECTED NOT DETECTED Final   Streptococcus pneumoniae NOT DETECTED NOT DETECTED Final   Streptococcus pyogenes NOT DETECTED NOT DETECTED Final   Acinetobacter baumannii NOT DETECTED NOT DETECTED Final   Enterobacteriaceae species NOT DETECTED NOT DETECTED Final   Enterobacter cloacae complex NOT DETECTED NOT DETECTED Final   Escherichia coli NOT DETECTED NOT DETECTED Final   Klebsiella oxytoca NOT DETECTED NOT DETECTED Final   Klebsiella pneumoniae NOT DETECTED NOT DETECTED Final   Proteus species NOT DETECTED NOT DETECTED Final   Serratia marcescens NOT DETECTED NOT DETECTED Final   Haemophilus influenzae NOT DETECTED NOT DETECTED Final   Neisseria meningitidis NOT DETECTED NOT DETECTED Final   Pseudomonas aeruginosa NOT DETECTED NOT DETECTED Final   Candida albicans NOT DETECTED NOT DETECTED Final   Candida glabrata NOT DETECTED NOT DETECTED Final   Candida krusei NOT DETECTED NOT DETECTED Final   Candida parapsilosis NOT DETECTED NOT DETECTED Final   Candida tropicalis NOT DETECTED NOT DETECTED Final    Comment: Performed at [facility], [place]. [address] [place], [place] [address]  Culture, blood (Routine X 2) w Reflex to ID Panel     Status: None   Collection Time: 06/19/16 11:08 AM  Result Value Ref Range Status   Specimen Description BLOOD RIGHT ARM  Final   Special Requests IN PEDIATRIC BOTTLE Blood Culture adequate volume  Final   Culture   Final    NO GROWTH 5 DAYS Performed at [facility], [place] [address], [place], [place] [address]    Report Status 06/24/2016 FINAL  Final  Culture, blood (Routine X 2) w Reflex to ID Panel     Status: None   Collection Time: 06/19/16 11:08 AM  Result Value Ref Range Status   Specimen Description BLOOD RIGHT HAND  Final   Special Requests IN PEDIATRIC BOTTLE Blood Culture adequate volume  Final   Culture   Final    NO GROWTH 5  DAYS Performed at [facility], [place] [address], [place], [place] [address]    Report Status 06/24/2016 FINAL  Final  Body fluid culture     Status: None   Collection Time: 06/19/16 12:25 PM  Result Value Ref Range Status   Specimen Description SYNOVIAL RIGHT KNEE  Final   Special Requests NONE  Final   Gram Stain   Final    MODERATE WBC PRESENT,BOTH PMN AND MONONUCLEAR FEW GRAM POSITIVE COCCI IN PAIRS    Culture   Final    MODERATE STAPHYLOCOCCUS AUREUS NO ANAEROBES ISOLATED Performed at [facility], [place] [address], [place], [place] [address]    Report Status 06/22/2016 FINAL  Final   Organism ID, Bacteria STAPHYLOCOCCUS AUREUS  Final      Susceptibility   Staphylococcus aureus - MIC*    CIPROFLOXACIN <=0.5 SENSITIVE Sensitive     ERYTHROMYCIN <=0.25 SENSITIVE Sensitive     GENTAMICIN <=0.5 SENSITIVE Sensitive     OXACILLIN <=0.25 SENSITIVE Sensitive     TETRACYCLINE <=1 SENSITIVE Sensitive     VANCOMYCIN <=0.5 SENSITIVE Sensitive     TRIMETH/SULFA <=10 SENSITIVE Sensitive     CLINDAMYCIN <=0.25 SENSITIVE Sensitive     RIFAMPIN <=0.5 SENSITIVE Sensitive     Inducible Clindamycin NEGATIVE Sensitive     * MODERATE STAPHYLOCOCCUS AUREUS  C  difficile quick scan w PCR reflex     Status: None   Collection Time: 06/26/16 11:47 AM  Result Value Ref Range Status   C Diff antigen NEGATIVE NEGATIVE Final   C Diff toxin NEGATIVE NEGATIVE Final   C Diff interpretation No C. difficile detected.  Final     Scheduled Meds: . chlorhexidine  15 mL Mouth Rinse BID  . Chlorhexidine Gluconate Cloth  6 each Topical Q0600  . feeding supplement  1 Container Oral BID BM  . Gerhardt's butt cream   Topical TID  . insulin aspart  0-20 Units Subcutaneous TID WC  . insulin aspart  0-5 Units Subcutaneous QHS  . insulin aspart  6 Units Subcutaneous TID WC  . insulin detemir  8 Units Subcutaneous BID  . mouth rinse  15 mL Mouth Rinse q12n4p  . mupirocin ointment  1 application Nasal  BID  . pantoprazole  40 mg Oral BID AC  . sodium chloride flush  10-40 mL Intracatheter Q12H   Continuous Infusions: . sodium chloride 10 mL/hr at 06/24/16 0600  . sodium chloride    . ampicillin-sulbactam (UNASYN) IV Stopped (06/27/16 1614)  . heparin 1,600 Units/hr (06/27/16 1258)    Procedures/Studies: Ct Abdomen Pelvis Wo Contrast  Result Date: 06/26/2016 CLINICAL DATA:  Evaluate for retroperitoneal hematoma. Acute blood loss/anemia. EXAM: CT ABDOMEN AND PELVIS WITHOUT CONTRAST TECHNIQUE: Multidetector CT imaging of the abdomen and pelvis was performed following the standard protocol without IV contrast. COMPARISON:  04/17/2013 FINDINGS: Lower chest: There are small bilateral pleural effusions identified peer increased interstitial markings within the lung bases compatible with pulmonary edema. The heart size appears enlarged. Aortic atherosclerosis identified. RCA coronary artery calcifications are identified. Hepatobiliary: No focal liver abnormality. Previous cholecystectomy. No biliary dilatation. Pancreas: Unremarkable. No pancreatic ductal dilatation or surrounding inflammatory changes. Spleen: Lobulated contour the spleen noted. Several accessory spleens are identified. Findings may reflect sequelae of prior splenic injury. Adrenals/Urinary Tract: The adrenal glands are normal. Large right kidney cyst is identified measuring 2.4 cm with small thin areas of mural calcification. There is no mass or hydronephrosis identified. The urinary bladder appears normal. Stomach/Bowel: The stomach is unremarkable. No abnormal dilatation of the small or large bowel loops. The appendix is visualized and appears normal. Sigmoid diverticulosis noted without acute inflammation. Vascular/Lymphatic: Aortic atherosclerosis noted. No aneurysm. No upper abdominal or pelvic adenopathy. No inguinal adenopathy. Reproductive: Prostate is unremarkable. Other: No ascites or focal fluid collections identified. There is  no evidence for retroperitoneal hematoma. Edema is identified compatible with anasarca which may be secondary to CHF. There is soft tissue edema involving both lower extremities. Musculoskeletal: Degenerative disc disease noted within the lumbar spine. IMPRESSION: 1. No evidence for hematoma. 2. Cardiac enlargement, Aortic Atherosclerosis (ICD10-I70.0). , pleural effusions, pulmonary edema, and anasarca compatible with congestive heart failure. 3. Right kidney cyst containing thin areas of mural calcification is incompletely characterized without IV contrast. Electronically Signed   By: Kerby Moors M.D.   On: 06/26/2016 15:46   Dg Chest 2 View  Result Date: 06/17/2016 CLINICAL DATA:  Lethargy and cough EXAM: CHEST  2 VIEW COMPARISON:  04/23/2014 FINDINGS: Cardiac shadow is stable. Pacing device is again noted. The lungs are well aerated bilaterally. No bony abnormality is noted. IMPRESSION: No active cardiopulmonary disease. Electronically Signed   By: Inez Catalina M.D.   On: 06/17/2016 20:38   Ct Chest Wo Contrast  Result Date: 06/18/2016 CLINICAL DATA:  Lethargy and cough for 2 days EXAM: CT CHEST WITHOUT  CONTRAST TECHNIQUE: Multidetector CT imaging of the chest was performed following the standard protocol without IV contrast. COMPARISON:  06/17/2016, CT 04/17/2016 FINDINGS: Cardiovascular: Limited evaluation without intravenous contrast. Aortic atherosclerosis. No aneurysmal dilatation. Coronary artery calcification. Partially visualized cardiac pacing leads. Mild cardiomegaly. No large pericardial effusion. Mediastinum/Nodes: Midline trachea. Few prominent sub- carinal and periesophageal lymph nodes, measuring up to 13 mm. Esophagus within normal limits. Esophagus Lungs/Pleura: Mild subpleural fibrosis. No focal consolidation, pleural effusion, or pneumothorax. Upper Abdomen: No acute abnormality. 9.6 cm cyst in the mid to upper right kidney. Vague hypodensity in the upper pole of the left kidney also  consistent with a cyst Musculoskeletal: Degenerative changes of the spine. No acute or suspicious bone lesion. IMPRESSION: 1. Mild subpleural fibrosis. No acute pulmonary infiltrates are visualized. 2. Mild nonspecific mediastinal adenopathy 3. Cardiomegaly 4. Bilateral kidney cysts, measuring up to 9.6 cm on the right. Electronically Signed   By: Donavan Foil M.D.   On: 06/18/2016 00:26   Dg Chest Port 1 View  Result Date: 06/23/2016 CLINICAL DATA:  Central line placement. EXAM: PORTABLE CHEST 1 VIEW COMPARISON:  06/17/2016 FINDINGS: Right arm PICC has its tip in the proximal right atrium. Withdrawal of 2 cm if this is desired to be above the right atrium. Pacemaker leads appear the same. Cardiomegaly. Chronic pulmonary scarring. IMPRESSION: Right arm PICC tip in the right atrium. Withdraw 2 cm to be at the SVC RA junction, if that is desired. Electronically Signed   By: Nelson Chimes M.D.   On: 06/23/2016 15:56   Dg Swallowing Func-speech Pathology  Result Date: 06/19/2016 Objective Swallowing Evaluation: Type of Study: MBS-Modified Barium Swallow Study Patient Details Name: Randy [name] MRN: [id number] Date of Birth: August 01, 1924 Today's Date: 06/19/2016 Time: SLP Start Time (ACUTE ONLY): 1520-SLP Stop Time (ACUTE ONLY): 1545 SLP Time Calculation (min) (ACUTE ONLY): 25 min Past Medical History: Past Medical History: Diagnosis Date . Anxiety  . Arthritis   R knee,  . CKD (chronic kidney disease)  . Coronary artery disease  . Diabetes mellitus without complication ([place])  . Dysrhythmia   PAF;,sss s/p St. Jude PPM . Hyperlipidemia  . Hypertension  . MI (myocardial infarction) ([place]) 2014 . Presence of permanent cardiac pacemaker  Past Surgical History: Past Surgical History: Procedure Laterality Date . CHOLECYSTECTOMY   . CORONARY ANGIOPLASTY    mLAD '13, RCA DES 03/2013 . EXCISIONAL TOTAL KNEE ARTHROPLASTY WITH ANTIBIOTIC SPACERS Right 04/20/2014  Procedure: IRRIGATION AND DEBRIDMENT RIGHT TOTAL KNEE REMOVE  ACL  IMPLANTED AND PLACE SPACER;  Surgeon: [name], MD;  Location: [facility];  Service: Orthopedics;  Laterality: Right; . HEMORROIDECTOMY   . KNEE SURGERY  2006 . PACEMAKER INSERTION   . PICC LINE PLACE PERIPHERAL ([place] [facility])  04/2014  R upper arm  . TOTAL KNEE ARTHROPLASTY WITH REVISION COMPONENTS Right 06/11/2014  Procedure: TOTAL KNEE ARTHROPLASTY WITH REVISION COMPONENTS REMOVE SPACER PLACE TKA;  Surgeon: [name], MD;  Location: [place];  Service: Orthopedics;  Laterality: Right; HPI: pt admit to [facility] with AMS, cough- found to be bacterial septic.  PMH + for DM, MI, no CVA hx. CT chest and CXR negative.  Swallow eval ordered due to pt having dysphagia.  Subjective: pt awake in chair Assessment / Plan / Recommendation CHL IP CLINICAL IMPRESSIONS 06/19/2016 Clinical Impression Pt presents with severe pharyngeal dysphagia - narrow pharynx and decreased tongue base retraction results in very poor epiglottic deflection and gross pharyngeal/vallecular residuals.  Pt requires multiple swallows (up to 5-6) with each  bolus and still has vallecular residuals. He did not aspirate or penetrate surprisingly.  Residuals were much worse with solids/pudding than liquids.  Various strategies/postures including head turn left, multiple swallows, following solid with liquids mitigate dysphagia.  Pt did not fully clear vallecular space but can fortunately hock and expectorate to clear.  Pt reports his swallow ability during MBS is "normal" for him causing SLP to suspect baseline significant dysphagia that he has been managing.  Recommend strict precautions to mitigate aspiration risk and modify diet to full liquids. Using teach back, pt able to demonstrate multiple swallows, "hocking" and oral suctioning independently.  Will follow for po tolerance, readiness for dietary advancement.   SLP Visit Diagnosis Dysphagia, oropharyngeal phase (R13.12) Attention and concentration deficit following -- Frontal lobe and executive function deficit  following -- Impact on safety and function Severe aspiration risk;Risk for inadequate nutrition/hydration   CHL IP TREATMENT RECOMMENDATION 06/19/2016 Treatment Recommendations Therapy as outlined in treatment plan below   Prognosis 06/19/2016 Prognosis for Safe Diet Advancement Fair Barriers to Reach Goals Time post onset Barriers/Prognosis Comment -- CHL IP DIET RECOMMENDATION 06/19/2016 SLP Diet Recommendations Nectar thick liquid;Thin liquid Liquid Administration via Cup;Straw Medication Administration (No Data) Compensations Slow rate;Small sips/bites;Follow solids with liquid;Multiple dry swallows after each bite/sip Postural Changes Seated upright at 90 degrees;Remain semi-upright after after feeds/meals (Comment)   CHL IP OTHER RECOMMENDATIONS 06/19/2016 Recommended Consults -- Oral Care Recommendations Oral care before and after PO Other Recommendations --   CHL IP FOLLOW UP RECOMMENDATIONS 06/19/2016 Follow up Recommendations (No Data)   CHL IP FREQUENCY AND DURATION 06/19/2016 Speech Therapy Frequency (ACUTE ONLY) min 2x/week Treatment Duration 1 week      CHL IP ORAL PHASE 06/19/2016 Oral Phase WFL Oral - Pudding Teaspoon -- Oral - Pudding Cup -- Oral - Honey Teaspoon -- Oral - Honey Cup -- Oral - Nectar Teaspoon -- Oral - Nectar Cup WFL Oral - Nectar Straw WFL Oral - Thin Teaspoon -- Oral - Thin Cup WFL Oral - Thin Straw WFL Oral - Puree WFL Oral - Mech Soft WFL Oral - Regular WFL Oral - Multi-Consistency -- Oral - Pill -- Oral Phase - Comment --  CHL IP PHARYNGEAL PHASE 06/19/2016 Pharyngeal Phase Impaired Pharyngeal- Pudding Teaspoon -- Pharyngeal -- Pharyngeal- Pudding Cup -- Pharyngeal -- Pharyngeal- Honey Teaspoon -- Pharyngeal -- Pharyngeal- Honey Cup -- Pharyngeal -- Pharyngeal- Nectar Teaspoon -- Pharyngeal -- Pharyngeal- Nectar Cup Reduced pharyngeal peristalsis;Pharyngeal residue - valleculae;Reduced tongue base retraction Pharyngeal -- Pharyngeal- Nectar Straw Reduced pharyngeal peristalsis;Reduced tongue  base retraction;Pharyngeal residue - valleculae Pharyngeal -- Pharyngeal- Thin Teaspoon -- Pharyngeal -- Pharyngeal- Thin Cup Reduced epiglottic inversion;Reduced tongue base retraction;Reduced pharyngeal peristalsis Pharyngeal -- Pharyngeal- Thin Straw Reduced pharyngeal peristalsis;Reduced epiglottic inversion;Pharyngeal residue - valleculae Pharyngeal -- Pharyngeal- Puree Reduced epiglottic inversion;Reduced pharyngeal peristalsis;Pharyngeal residue - valleculae Pharyngeal -- Pharyngeal- Mechanical Soft -- Pharyngeal -- Pharyngeal- Regular Reduced pharyngeal peristalsis;Reduced epiglottic inversion;Reduced tongue base retraction;Pharyngeal residue - valleculae Pharyngeal -- Pharyngeal- Multi-consistency -- Pharyngeal -- Pharyngeal- Pill -- Pharyngeal -- Pharyngeal Comment pt without awareness to mild leftover vallecular residuals, he is able to clear/expectorate with cues   CHL IP CERVICAL ESOPHAGEAL PHASE 06/19/2016 Cervical Esophageal Phase WFL Pudding Teaspoon -- Pudding Cup -- Honey Teaspoon -- Honey Cup -- Nectar Teaspoon -- Nectar Cup -- Nectar Straw -- Thin Teaspoon -- Thin Cup -- Thin Straw -- Puree -- Mechanical Soft -- Regular -- Multi-consistency -- Pill -- Cervical Esophageal Comment -- No flowsheet data found. Luanna Salk, [place] [facility] SLP [phone]  Kimiko Common, DO  [facility] Pager [phone]  If 7PM-7AM, please contact night-coverage [email or web address] Password [email or web address] 06/27/2016, 6:03 PM   LOS: 10 days

## 2016-06-27 NOTE — Progress Notes (Signed)
PATIENT ID: Randy [name]  MRN: [id number]  DOB/AGE:  1924-05-31 / 83 y.o.  5 Days Post-Op Procedure(s): aborted TEE    PROGRESS NOTE Subjective:  Patient is alert, reports mild right knee pain  Objective: Vital signs in last 24 hours: Temp:  [97.9 F (36.6 C)-98 F (36.7 C)] 97.9 F (36.6 C) (05/12 0529) Pulse Rate:  [67-73] 73 (05/12 0529) Resp:  [16-24] 22 (05/12 0529) BP: (117-156)/(38-61) 156/45 (05/12 0529) SpO2:  [98 %-99 %] 98 % (05/12 0529)    Intake/Output from previous day: I/O last 3 completed shifts: In: 3014.2 [P.O.:480; I.V.:1934.2; IV ELFYBOFBP:102] Out: 5852 [Urine:1750; Stool:500]   Intake/Output this shift: No intake/output data recorded.   LABORATORY DATA:  Recent Labs  06/26/16 0409  06/26/16 1629  06/26/16 2128 06/27/16 0542 06/27/16 0546 06/27/16 0737 06/27/16 1130  WBC 13.8*  --  14.7*  --   --  15.1*  --   --   --   HGB 8.6*  --  8.0*  --   --  8.0*  --   --   --   HCT 25.7*  --  24.6*  --   --  24.1*  --   --   --   PLT 527*  --  514*  --   --  507*  --   --   --   NA 139  --   --   --   --   --  138  --   --   K 3.5  --   --   --   --   --  3.5  --   --   CL 108  --   --   --   --   --  107  --   --   CO2 22  --   --   --   --   --  24  --   --   BUN 13  --   --   --   --   --  11  --   --   CREATININE 1.80*  --   --   --   --   --  1.89*  --   --   GLUCOSE 202*  --   --   --   --   --  224*  --   --   GLUCAP  --   < >  --   < > 183*  --   --  167* 217*  CALCIUM 7.9*  --   --   --   --   --  7.9*  --   --   < > = values in this interval not displayed.  Examination: B LE edema persists.  R knee with minimal effusion, no significant redness, Range 15-45  Assessment:   R prosthetic knee infection   Plan: Will monitor knee and plan for serial aspirations (as needed) with anti-biotic suppression of infection. He is not a candidate for AKA at this time and would probably not survive surgery.  Recommend palliative care at this point.    Najib Colmenares A. 06/27/2016, 12:00 PM

## 2016-06-27 NOTE — Progress Notes (Signed)
ANTICOAGULATION CONSULT NOTE -   Pharmacy Consult for heparin Indication: DVT  No Known Allergies  Patient Measurements: Height: 5\' 11"  (180.3 cm) Weight: 212 lb 4.9 oz (96.3 kg) IBW/kg (Calculated) : 75.3 Heparin Dosing Weight: 94 kg  Vital Signs: Temp: 97.9 F (36.6 C) (05/12 0529) Temp Source: Oral (05/12 0529) BP: 156/45 (05/12 0529) Pulse Rate: 73 (05/12 0529)  Labs:  Recent Labs  06/25/16 0435  06/26/16 0409 06/26/16 1226 06/26/16 1629 06/27/16 0542 06/27/16 0546  HGB  --   < > 8.6*  --  8.0* 8.0*  --   HCT  --   --  25.7*  --  24.6* 24.1*  --   PLT  --   --  527*  --  514* 507*  --   HEPARINUNFRC 0.32  < > 0.33 0.38  --   --  0.43  CREATININE 1.71*  --  1.80*  --   --   --  1.89*  < > = values in this interval not displayed.  Estimated Creatinine Clearance: 30.8 mL/min (A) (by C-G formula based on SCr of 1.89 mg/dL (H)).   Medical History: Past Medical History:  Diagnosis Date  . Anxiety   . Arthritis    R knee,   . CKD (chronic kidney disease)   . Coronary artery disease   . Diabetes mellitus without complication ([place])   . Dysrhythmia    PAF;,sss s/p St. Jude PPM  . Hyperlipidemia   . Hypertension   . MI (myocardial infarction) ([place]) 2014  . Presence of permanent cardiac pacemaker     Assessment: 60 yoM admitted for sepsis secondary to MSSA/Enterococcus bacteremia, recurrent right prosthetic knee infection, groin cellulitis.  Bilateral lower extremity venous duplex completed today shows bilateral acute DVTs. Pt was initially on prophylactic enoxaparin.  Pharmacy consulted to start heparin.  Today, 06/27/2016: - Heparin level remains therapeutic at 0.43 - hgb low but stable at 8, plt high at 507 - s/p knee aspiration 5/12 - abd CT on 5/11 neg for bleed -- Gi added PPI and signed off - plan to transition to Eliquis when no further intervention is planned for pt   Goal of Therapy:  Heparin level 0.3-0.7 units/ml Monitor platelets by  anticoagulation protocol: Yes   Plan:  - continue heparin infusion at 1600 units/hr. - daily heparin level - monitor for s/s bleeding - F/u plans for transition to Eliquis.  Dia Sitter, PharmD, BCPS 06/27/2016 8:35 AM

## 2016-06-27 NOTE — Progress Notes (Signed)
ANTICOAGULATION CONSULT NOTE -   Pharmacy Consult for enoxaparin Indication: DVT  No Known Allergies  Patient Measurements: Height: 5\' 11"  (180.3 cm) Weight: 212 lb 4.9 oz (96.3 kg) IBW/kg (Calculated) : 75.3 Heparin Dosing Weight: 94 kg  Vital Signs: Temp: 98.3 F (36.8 C) (05/12 1300) Temp Source: Oral (05/12 1300) BP: 145/66 (05/12 1300) Pulse Rate: 81 (05/12 1300)  Labs:  Recent Labs  06/25/16 0435  06/26/16 0409 06/26/16 1226 06/26/16 1629 06/27/16 0542 06/27/16 0546 06/27/16 1158  HGB  --   < > 8.6*  --  8.0* 8.0*  --  7.7*  HCT  --   < > 25.7*  --  24.6* 24.1*  --  23.5*  PLT  --   < > 527*  --  514* 507*  --  523*  HEPARINUNFRC 0.32  < > 0.33 0.38  --   --  0.43  --   CREATININE 1.71*  --  1.80*  --   --   --  1.89*  --   < > = values in this interval not displayed.  Estimated Creatinine Clearance: 30.8 mL/min (A) (by C-G formula based on SCr of 1.89 mg/dL (H)).   Medical History: Past Medical History:  Diagnosis Date  . Anxiety   . Arthritis    R knee,   . CKD (chronic kidney disease)   . Coronary artery disease   . Diabetes mellitus without complication ([place])   . Dysrhythmia    PAF;,sss s/p St. Jude PPM  . Hyperlipidemia   . Hypertension   . MI (myocardial infarction) ([place]) 2014  . Presence of permanent cardiac pacemaker     Assessment: 74 yoM admitted for sepsis secondary to MSSA/Enterococcus bacteremia, recurrent right prosthetic knee infection, groin cellulitis.  Bilateral lower extremity venous duplex completed today shows bilateral acute DVTs. Pt was initially on prophylactic enoxaparin then heparin, now eliquis.   Today, 06/27/2016: - hgb low but stable at 8, plt high at 507 - s/p knee aspiration 5/12 - abd CT on 5/11 neg for bleed -- Gi added PPI and signed off - Scr 1.89, CrCl ~ 31 mls/min - no dose adjustment recommended in renal impairment however pts with Scr > 2.5 mg/dL or CrCl < 25ml/min were excluded from the clinical trials.      Goal of Therapy:  apixaban per indication  Plan:  - stop heparin and give eliquis 10mg  twice daily for 7 days followed by 5mg  twice daily - monitor for s/s bleeding   [name] RPh 06/27/2016, 6:30 PM Pager [phone]

## 2016-06-28 ENCOUNTER — Inpatient Hospital Stay (HOSPITAL_COMMUNITY): Payer: Medicare Other

## 2016-06-28 LAB — BASIC METABOLIC PANEL
ANION GAP: 7 (ref 5–15)
BUN: 10 mg/dL (ref 6–20)
CO2: 23 mmol/L (ref 22–32)
CREATININE: 2.04 mg/dL — AB (ref 0.61–1.24)
Calcium: 7.9 mg/dL — ABNORMAL LOW (ref 8.9–10.3)
Chloride: 108 mmol/L (ref 101–111)
GFR calc Af Amer: 31 mL/min — ABNORMAL LOW (ref >60)
GFR, EST NON AFRICAN AMERICAN: 27 mL/min — AB (ref >60)
GLUCOSE: 218 mg/dL — AB (ref 65–99)
Potassium: 3.5 mmol/L (ref 3.5–5.1)
Sodium: 138 mmol/L (ref 135–145)

## 2016-06-28 LAB — CBC WITH DIFFERENTIAL/PLATELET
BASOS ABS: 0 10*3/uL (ref 0.0–0.1)
BASOS PCT: 0 %
EOS ABS: 0.5 10*3/uL (ref 0.0–0.7)
Eosinophils Relative: 3 %
HEMATOCRIT: 23.7 % — AB (ref 39.0–52.0)
HEMOGLOBIN: 7.9 g/dL — AB (ref 13.0–17.0)
LYMPHS ABS: 2.7 10*3/uL (ref 0.7–4.0)
LYMPHS PCT: 16 %
MCH: 28.5 pg (ref 26.0–34.0)
MCHC: 33.3 g/dL (ref 30.0–36.0)
MCV: 85.6 fL (ref 78.0–100.0)
MONO ABS: 1.7 10*3/uL — AB (ref 0.1–1.0)
Monocytes Relative: 10 %
NEUTROS ABS: 12.2 10*3/uL — AB (ref 1.7–7.7)
Neutrophils Relative %: 71 %
PLATELETS: 556 10*3/uL — AB (ref 150–400)
RBC: 2.77 MIL/uL — ABNORMAL LOW (ref 4.22–5.81)
RDW: 17.7 % — ABNORMAL HIGH (ref 11.5–15.5)
WBC: 17.1 10*3/uL — ABNORMAL HIGH (ref 4.0–10.5)

## 2016-06-28 LAB — GLUCOSE, CAPILLARY
GLUCOSE-CAPILLARY: 148 mg/dL — AB (ref 65–99)
GLUCOSE-CAPILLARY: 245 mg/dL — AB (ref 65–99)
GLUCOSE-CAPILLARY: 192 mg/dL — AB (ref 65–99)
GLUCOSE-CAPILLARY: 184 mg/dL — AB (ref 65–99)

## 2016-06-28 LAB — MAGNESIUM: Magnesium: 1.9 mg/dL (ref 1.7–2.4)

## 2016-06-28 LAB — URINE CULTURE: CULTURE: NO GROWTH

## 2016-06-28 LAB — HAPTOGLOBIN: HAPTOGLOBIN: 253 mg/dL — AB (ref 34–200)

## 2016-06-28 MED ORDER — INSULIN ASPART 100 UNIT/ML ~~LOC~~ SOLN
10.0000 [IU] | Freq: Three times a day (TID) | SUBCUTANEOUS | Status: DC
  Administered 2016-06-28 – 2016-06-30 (×4): 10 [IU] via SUBCUTANEOUS

## 2016-06-28 MED ORDER — INSULIN DETEMIR 100 UNIT/ML ~~LOC~~ SOLN
10.0000 [IU] | Freq: Two times a day (BID) | SUBCUTANEOUS | Status: DC
  Administered 2016-06-28 – 2016-07-06 (×16): 10 [IU] via SUBCUTANEOUS
  Filled 2016-06-28 (×17): qty 0.1

## 2016-06-28 MED ORDER — FUROSEMIDE 10 MG/ML IJ SOLN
20.0000 mg | Freq: Once | INTRAMUSCULAR | Status: AC
  Administered 2016-06-28: 20 mg via INTRAVENOUS
  Filled 2016-06-28: qty 2

## 2016-06-28 NOTE — Progress Notes (Signed)
PATIENT ID: Randy [name]  MRN: [id number]  DOB/AGE:  January 16, 1925 / 83 y.o.  6 Days Post-Op Procedure(s): aborted TEE    PROGRESS NOTE Subjective:  Patient is alert, reports no right knee pain at rest, mild with passive movement WBC noted to be slight upward trend, pt AF  Objective: Vital signs in last 24 hours: Temp:  [98.3 F (36.8 C)-98.5 F (36.9 C)] 98.3 F (36.8 C) (05/13 1329) Pulse Rate:  [69-75] 74 (05/13 1329) Resp:  [18-20] 18 (05/13 1329) BP: (120-147)/(55-66) 120/66 (05/13 1329) SpO2:  [98 %-99 %] 98 % (05/13 1329)    Intake/Output from previous day: I/O last 3 completed shifts: In: 2584 [P.O.:240; I.V.:1644; IV Piggyback:700] Out: 0100 [Urine:3925; Stool:100]   Intake/Output this shift: Total I/O In: 240 [P.O.:240] Out: 750 [Urine:750]   LABORATORY DATA:  Recent Labs  06/27/16 0546  06/27/16 1158  06/27/16 2037 06/28/16 0338 06/28/16 0729 06/28/16 1221  WBC  --   --  16.0*  --   --  17.1*  --   --   HGB  --   --  7.7*  --   --  7.9*  --   --   HCT  --   --  23.5*  --   --  23.7*  --   --   PLT  --   --  523*  --   --  556*  --   --   NA 138  --   --   --   --  138  --   --   K 3.5  --   --   --   --  3.5  --   --   CL 107  --   --   --   --  108  --   --   CO2 24  --   --   --   --  23  --   --   BUN 11  --   --   --   --  10  --   --   CREATININE 1.89*  --   --   --   --  2.04*  --   --   GLUCOSE 224*  --   --   --   --  218*  --   --   GLUCAP  --   < >  --   < > 202*  --  148* 245*  CALCIUM 7.9*  --   --   --   --  7.9*  --   --   < > = values in this interval not displayed.  Examination: B LE edema persists.  R knee with mild effusion, no significant redness, passive range 15-60 with mild pain  Assessment:   R prosthetic knee infection   Plan/Precedure: After obtaining consent, R knee aspirated from SL approach after alcohol/CHG prep.  40mL of turbid fluid obtained, not thick or creamy. Fluid discarded, anatomic landmarks better  restored.  Dr. Mayer Camel to resume care as needed tomorrow.  Present plan is to monitor knee and plan for serial aspirations (as needed) with anti-biotic suppression of infection. He is not a candidate for AKA at this time and would probably not survive surgery.  Recommend palliative care at this point.     Garrin Kirwan A. 06/28/2016, 1:37 PM

## 2016-06-28 NOTE — Progress Notes (Signed)
PROGRESS NOTE  Randy [name] PNT:[id number] DOB: 15-Apr-1924 DOA: 06/17/2016 PCP: System, Pcp Not In  Brief History: 83 y.o.gentleman with a history of mild dementia/cognitive impairment, CAD with prior stent placement, history of atrial fibrillation S/P PPM implant (CHADS-Vasc score of at least four, no anticoagulation), HTN, HLD, DM, and CKD 3 with baseline creatinine around 1.3-1.5 who was referred to the ED via EMS by one of his caregivers for evaluation of increased lethargy, mental status changes, and cough. The patient is nonambulatory at baseline and spends most of his day sitting up a recliner. He has caregiver support 24 hours/day. He has had cough for 4-5 days. He has had two known sick contacts (son-in-law and caregiver). He has had yellow sputum. No chest pain or shortness of breath. No nausea or vomiting/diarrhea. No abdominal pain. No dysuria/hematuria On 06/17/16, he was noted to have increased lethargy and muffled speech with slurring. No other focal weakenss. 911 was called and the patient was transferred to the ED for evaluation. No recent hospitalizations. No recent antibiotics. He has been using OTC mucinex for his symptoms.  Assessment/Plan: Sepsis secondary to MSSA/Enterococcus bacteremia/MSSA Right prosthetic knee infection -Secondary to bacteremia and prosthetic knee infection -Presented with fever, tachycardia, tachypnea, leukocytosis -Patient also had elevated pro calcitonin level as well as lactic acid -Initially placed on vancomycin and zosyn, and transitioned to Unasyn -Chest x-ray, UA unremarkable for infection  -CT chest showed subpleural fibrosis, no acute pulmonary infiltrates. -Influenza PCR, strep pneumonia urine antigen negative -Blood cultures 06/17/2016: MSSA, enterococcus  -sepsis physiology resolved  MSSA/Enterococcus bacteremia/MSSA Right prosthetic knee infection -Blood cultures 06/17/2016: MSSA, E faecalis -Repeat blood  cultures 06/19/16 no growth to date -Echocardiogram (TTE)showed EF 55-60%, moderate AS, trivial MR/TR/AI -Infectious disease consulted and appreciated, recommended TEE and EP consult -Cardiology consulted and appreciated- TEE attempted multiple times on 06/22/16-  -Per cardiology, patient is at high risk of complication due to high risk comorbidities. No plan for device extraction at this time. -Orthopedic surgery consulted and appreciated for infected knee. S/p aspiration x2-->grew MSSA -Patient seen by Dr. Mayer Camel (ortho), patient would need an AKA, however patient seen as a poor surgical candidate. Ortho has discussed this with the patient's son -Patient will need IV unasyn for 6 weeks, and followed by PO amox/clavfor suppression thereafter. -06/22/16 PICC line -06/26/16--case discussed with ortho--no plans for surgery--monitor for serial knee aspirations, f/u office in one week after d/c; repeated R-knee aspiration 5/11, 5/13 -06/27/16--repeat blood culture as WBC trending up--remains negative -reconsult ID if WBC continues to rise with neg cultures -ESR, CRP  Right and Leftlower extremity DVT -clinically provoked, but son states that pt has had 2 or 3 previous episodes of DVTs in past -son unclear why pt is not on chronic anticoagulation -LE doppler--acute DVT Right common femoral, femoral, and popliteal veinsand Left common femoral and femoral vein -startedIV heparin -transitioned to po apixaban on 06/27/16  Dysphagia -Speech consulted and recommended FULL liquids due to abnormal MBS results -will ask speech to re-evaluation again-->Dys 2 diet -son states pt was on regular diet prior to admission but did require food to be chopped up -discussed with son--he does not want to restrict diet, but does not seem to full accept or fully understand the risk of aspiration--"he's always eaten solid food without problems"  Acute Blood Loss Anemia -drop ~4 grams Hgb in past 7  days -FOBT--neg -GI consult appreciated-->bid PPI -CT abd/pelvis-neg for retroperitoneal bleed -LDH--no suggestive of  hemolysis -repeat CBC in am  Anasarca/Fluid Excess -5/12-lasix 40 mg IV x 1 -5/13-lasix 20 mg IV x 1  Acute on Chronic kidney disease, stage III -Creatinine baseline at 1.3-1.6 -Serum creatinine peaked 2.04 -Creatinine trending  up -Am BMP -renal US--neg for hydronephrosis -UA is bland  Diarrhea -check Cdiff--negative -continue Flexiseal -slowing down  Dermatophytosis/tinea cruris -Groin area appears to be erythematous and flaky -discontinuediflucan--pt finished one week -continue mycostatin topical -suspect pt has component of incontinence dermatitis -improved with condom cath  Essential hypertension -Currently on no home medications, continue IV hydralazine PRN  Diabetes mellitus, type II -Currently on no home medications -Continue insulin sliding scale, Levemir, CBG monitoring -06/25/16 check A1C--11.5 -increasenovolog 10units with meals -increase levemir to 10units bid  History of atrial fibrillation -CHADSVASC 5 (based on age, DM, HTN, CAD) -previously not on [facility] -Status post pacemaker placement- cardiology consulted as patient does have bacteremia, as above, no plan for device extraction at this time.   Stage II pressure injury -moisture related -wound care consulted and appreciated -not infected on exam  Moderate aortic stenosis -Noted on echocardiogram  Cognitive impairment -Per son patient can sometimes be clear minded but is not always  Hypokalemia -K 3.0, continue to replete -Monitor BMP -mag= 1.8  Hypomagnesemia -Magnesium 1.8, continue to monitor and replace as needed  Goals of care -Had discuss with son via phone regarding patient, current treatment and plan, code status, mental status. Per son, patient's mental status is not always clear and at this time he would like to remain full code and possible have  surgeries that might be needed. -Cardio and ortho recommend palliative care consult. Discussed with son, does not feel palliative care consult is needed right now.  DVT Prophylaxislovenox  Code Status:Full  Family Communication:updated son on phone 5/10  Disposition Plan:Home when serum creatinine or WBC stabilizes   Consultants Infectious disease Cardiology Orthopedics  Procedures  Echocardiogram Right knee aspiration x2 Attempted TEE  Antibiotics vanco 5/2>>> Unasyn 5/7>>> Nafcillin 5/3>>5/7   Subjective: Patient complains of right knee pain. He states it is not much better than the last several days. It is not much worse. He states it is constant with worsening with movement. Patient denies fevers, chills, headache, chest pain, dyspnea, nausea, vomiting, diarrhea, abdominal pain, dysuria, hematuria, hematochezia, and melena.   Objective: Vitals:   06/27/16 1300 06/27/16 2038 06/28/16 0516 06/28/16 1329  BP: (!) 145/66 (!) 141/56 (!) 147/55 120/66  Pulse: 81 69 75 74  Resp:  '20 18 18  '$ Temp: 98.3 F (36.8 C) 98.5 F (36.9 C) 98.5 F (36.9 C) 98.3 F (36.8 C)  TempSrc: Oral Oral Oral Oral  SpO2: 99% 99% 98% 98%  Weight:      Height:        Intake/Output Summary (Last 24 hours) at 06/28/16 1644 Last data filed at 06/28/16 1458  Gross per 24 hour  Intake           733.67 ml  Output             3675 ml  Net         -2941.33 ml   Weight change:  Exam:   General:  Pt is alert, follows commands appropriately, not in acute distress  HEENT: No icterus, No thrush, No neck mass, [place]/AT  Cardiovascular: RRR, S1/S2, no rubs, no gallops  Respiratory: Bibasilar crackles. No wheezing. Good air movement.  Abdomen: Soft/+BS, non tender, non distended, no guarding  Extremities: 1 + LE edema, No lymphangitis,  No petechiae, No rashes, R-knee synovitis  -Stage II sacral wound with some mild erythema. There is no purulent drainage. No lymphangitis. No  necrosis.   Data Reviewed: I have personally reviewed following labs and imaging studies Basic Metabolic Panel:  Recent Labs Lab 06/22/16 0352  06/23/16 0349 06/24/16 0348 06/25/16 0435 06/26/16 0409 06/27/16 0546 06/28/16 0338  NA 139  < >  --  138 138 139 138 138  K 3.2*  < >  --  3.6 3.4* 3.5 3.5 3.5  CL 111  < >  --  110 110 108 107 108  CO2 20*  < >  --  21* '22 22 24 23  '$ GLUCOSE 123*  < >  --  185* 140* 202* 224* 218*  BUN 18  < >  --  '17 15 13 11 10  '$ CREATININE 1.83*  < >  --  1.69* 1.71* 1.80* 1.89* 2.04*  CALCIUM 7.2*  < >  --  7.8* 7.7* 7.9* 7.9* 7.9*  MG 1.6*  --  1.9  --  1.8  --   --  1.9  PHOS 2.0*  --   --   --   --   --   --   --   < > = values in this interval not displayed. Liver Function Tests:  Recent Labs Lab 06/27/16 0546  AST 14*  ALT 6*  ALKPHOS 52  BILITOT 1.1  PROT 5.8*  ALBUMIN 1.9*   No results for input(s): LIPASE, AMYLASE in the last 168 hours. No results for input(s): AMMONIA in the last 168 hours. Coagulation Profile: No results for input(s): INR, PROTIME in the last 168 hours. CBC:  Recent Labs Lab 06/26/16 0409 06/26/16 1629 06/27/16 0542 06/27/16 1158 06/28/16 0338  WBC 13.8* 14.7* 15.1* 16.0* 17.1*  NEUTROABS 8.4*  --   --   --  12.2*  HGB 8.6* 8.0* 8.0* 7.7* 7.9*  HCT 25.7* 24.6* 24.1* 23.5* 23.7*  MCV 84.5 83.7 84.3 84.5 85.6  PLT 527* 514* 507* 523* 556*   Cardiac Enzymes: No results for input(s): CKTOTAL, CKMB, CKMBINDEX, TROPONINI in the last 168 hours. BNP: Invalid input(s): POCBNP CBG:  Recent Labs Lab 06/27/16 1130 06/27/16 1639 06/27/16 2037 06/28/16 0729 06/28/16 1221  GLUCAP 217* 183* 202* 148* 245*   HbA1C: No results for input(s): HGBA1C in the last 72 hours. Urine analysis:    Component Value Date/Time   COLORURINE STRAW (A) 06/27/2016 1541   APPEARANCEUR CLEAR 06/27/2016 1541   LABSPEC 1.008 06/27/2016 1541   PHURINE 6.0 06/27/2016 1541   GLUCOSEU 150 (A) 06/27/2016 1541   HGBUR  NEGATIVE 06/27/2016 1541   BILIRUBINUR NEGATIVE 06/27/2016 1541   KETONESUR NEGATIVE 06/27/2016 1541   PROTEINUR NEGATIVE 06/27/2016 1541   UROBILINOGEN 0.2 05/18/2014 1634   NITRITE NEGATIVE 06/27/2016 1541   LEUKOCYTESUR NEGATIVE 06/27/2016 1541   Sepsis Labs: '@LABRCNTIP'$ (procalcitonin:4,lacticidven:4) ) Recent Results (from the past 240 hour(s))  Culture, blood (Routine X 2) w Reflex to ID Panel     Status: None   Collection Time: 06/18/16  6:16 PM  Result Value Ref Range Status   Specimen Description BLOOD RIGHT ARM  Final   Special Requests IN PEDIATRIC BOTTLE Blood Culture adequate volume  Final   Culture   Final    NO GROWTH 5 DAYS Performed at [facility], [place] [address], [place], [place] [address]    Report Status 06/23/2016 FINAL  Final  Culture, blood (Routine X 2) w Reflex to ID  Panel     Status: Abnormal   Collection Time: 06/18/16  6:22 PM  Result Value Ref Range Status   Specimen Description BLOOD RIGHT ANTECUBITAL  Final   Special Requests IN PEDIATRIC BOTTLE Blood Culture adequate volume  Final   Culture  Setup Time   Final    GRAM POSITIVE COCCI IN CLUSTERS IN PEDIATRIC BOTTLE CRITICAL RESULT CALLED TO, READ BACK BY AND VERIFIED WITH: D ZEIGLER PHARMD 1932 06/19/16 A BROWNING    Culture (A)  Final    STAPHYLOCOCCUS AUREUS SUSCEPTIBILITIES PERFORMED ON PREVIOUS CULTURE WITHIN THE LAST 5 DAYS. Performed at [facility], [place] [address] [place], [place] [address]    Report Status 06/21/2016 FINAL  Final  Blood Culture ID Panel (Reflexed)     Status: Abnormal   Collection Time: 06/18/16  6:22 PM  Result Value Ref Range Status   Enterococcus species NOT DETECTED NOT DETECTED Final   Listeria monocytogenes NOT DETECTED NOT DETECTED Final   Staphylococcus species DETECTED (A) NOT DETECTED Final    Comment: CRITICAL RESULT CALLED TO, READ BACK BY AND VERIFIED WITHMarcy Siren PHARMD 1932 06/19/16 A BROWNING    Staphylococcus aureus DETECTED (A) NOT  DETECTED Final    Comment: Methicillin (oxacillin) susceptible Staphylococcus aureus (MSSA). Preferred therapy is anti staphylococcal beta lactam antibiotic (Cefazolin or Nafcillin), unless clinically contraindicated. CRITICAL RESULT CALLED TO, READ BACK BY AND VERIFIED WITHMarcy Siren PHARMD 1932 06/19/16 A BROWNING    Methicillin resistance NOT DETECTED NOT DETECTED Final   Streptococcus species NOT DETECTED NOT DETECTED Final   Streptococcus agalactiae NOT DETECTED NOT DETECTED Final   Streptococcus pneumoniae NOT DETECTED NOT DETECTED Final   Streptococcus pyogenes NOT DETECTED NOT DETECTED Final   Acinetobacter baumannii NOT DETECTED NOT DETECTED Final   Enterobacteriaceae species NOT DETECTED NOT DETECTED Final   Enterobacter cloacae complex NOT DETECTED NOT DETECTED Final   Escherichia coli NOT DETECTED NOT DETECTED Final   Klebsiella oxytoca NOT DETECTED NOT DETECTED Final   Klebsiella pneumoniae NOT DETECTED NOT DETECTED Final   Proteus species NOT DETECTED NOT DETECTED Final   Serratia marcescens NOT DETECTED NOT DETECTED Final   Haemophilus influenzae NOT DETECTED NOT DETECTED Final   Neisseria meningitidis NOT DETECTED NOT DETECTED Final   Pseudomonas aeruginosa NOT DETECTED NOT DETECTED Final   Candida albicans NOT DETECTED NOT DETECTED Final   Candida glabrata NOT DETECTED NOT DETECTED Final   Candida krusei NOT DETECTED NOT DETECTED Final   Candida parapsilosis NOT DETECTED NOT DETECTED Final   Candida tropicalis NOT DETECTED NOT DETECTED Final    Comment: Performed at [facility], [address] [place], [place] [address]  Culture, blood (Routine X 2) w Reflex to ID Panel     Status: None   Collection Time: 06/19/16 11:08 AM  Result Value Ref Range Status   Specimen Description BLOOD RIGHT ARM  Final   Special Requests IN PEDIATRIC BOTTLE Blood Culture adequate volume  Final   Culture   Final    NO GROWTH 5 DAYS Performed at [facility], [place] [address] [place], [place] [address]    Report Status 06/24/2016 FINAL  Final  Culture, blood (Routine X 2) w Reflex to ID Panel     Status: None   Collection Time: 06/19/16 11:08 AM  Result Value Ref Range Status   Specimen Description BLOOD RIGHT HAND  Final   Special Requests IN PEDIATRIC BOTTLE Blood Culture adequate volume  Final  Culture   Final    NO GROWTH 5 DAYS Performed at [facility], [place] [address] [place], [place] [address]    Report Status 06/24/2016 FINAL  Final  Body fluid culture     Status: None   Collection Time: 06/19/16 12:25 PM  Result Value Ref Range Status   Specimen Description SYNOVIAL RIGHT KNEE  Final   Special Requests NONE  Final   Gram Stain   Final    MODERATE WBC PRESENT,BOTH PMN AND MONONUCLEAR FEW GRAM POSITIVE COCCI IN PAIRS    Culture   Final    MODERATE STAPHYLOCOCCUS AUREUS NO ANAEROBES ISOLATED Performed at [facility], [place] [address] [place], [place] [address]    Report Status 06/22/2016 FINAL  Final   Organism ID, Bacteria STAPHYLOCOCCUS AUREUS  Final      Susceptibility   Staphylococcus aureus - MIC*    CIPROFLOXACIN <=0.5 SENSITIVE Sensitive     ERYTHROMYCIN <=0.25 SENSITIVE Sensitive     GENTAMICIN <=0.5 SENSITIVE Sensitive     OXACILLIN <=0.25 SENSITIVE Sensitive     TETRACYCLINE <=1 SENSITIVE Sensitive     VANCOMYCIN <=0.5 SENSITIVE Sensitive     TRIMETH/SULFA <=10 SENSITIVE Sensitive     CLINDAMYCIN <=0.25 SENSITIVE Sensitive     RIFAMPIN <=0.5 SENSITIVE Sensitive     Inducible Clindamycin NEGATIVE Sensitive     * MODERATE STAPHYLOCOCCUS AUREUS  C difficile quick scan w PCR reflex     Status: None   Collection Time: 06/26/16 11:47 AM  Result Value Ref Range Status   C Diff antigen NEGATIVE NEGATIVE Final   C Diff toxin NEGATIVE NEGATIVE Final   C Diff interpretation No C. difficile detected.  Final  Culture, Urine     Status: None   Collection Time: 06/27/16  3:41 PM  Result Value Ref Range Status   Specimen  Description URINE, CLEAN CATCH  Final   Special Requests NONE  Final   Culture   Final    NO GROWTH Performed at [facility], [address], [place], [place] [address]    Report Status 06/28/2016 FINAL  Final     Scheduled Meds: . apixaban  10 mg Oral BID   Followed by  . [START ON 07/04/2016] apixaban  5 mg Oral BID  . chlorhexidine  15 mL Mouth Rinse BID  . Chlorhexidine Gluconate Cloth  6 each Topical Q0600  . feeding supplement  1 Container Oral BID BM  . Gerhardt's butt cream   Topical TID  . insulin aspart  0-20 Units Subcutaneous TID WC  . insulin aspart  0-5 Units Subcutaneous QHS  . insulin aspart  8 Units Subcutaneous TID WC  . insulin detemir  8 Units Subcutaneous BID  . mouth rinse  15 mL Mouth Rinse q12n4p  . mupirocin ointment  1 application Nasal BID  . pantoprazole  40 mg Oral BID AC  . sodium chloride flush  10-40 mL Intracatheter Q12H   Continuous Infusions: . sodium chloride 10 mL/hr at 06/24/16 0600  . sodium chloride    . ampicillin-sulbactam (UNASYN) IV Stopped (06/28/16 1534)    Procedures/Studies: Ct Abdomen Pelvis Wo Contrast  Result Date: 06/26/2016 CLINICAL DATA:  Evaluate for retroperitoneal hematoma. Acute blood loss/anemia. EXAM: CT ABDOMEN AND PELVIS WITHOUT CONTRAST TECHNIQUE: Multidetector CT imaging of the abdomen and pelvis was performed following the standard protocol without IV contrast. COMPARISON:  04/17/2013 FINDINGS: Lower chest: There are small bilateral pleural effusions identified peer increased interstitial markings  within the lung bases compatible with pulmonary edema. The heart size appears enlarged. Aortic atherosclerosis identified. RCA coronary artery calcifications are identified. Hepatobiliary: No focal liver abnormality. Previous cholecystectomy. No biliary dilatation. Pancreas: Unremarkable. No pancreatic ductal dilatation or surrounding inflammatory changes. Spleen: Lobulated contour the spleen noted. Several accessory  spleens are identified. Findings may reflect sequelae of prior splenic injury. Adrenals/Urinary Tract: The adrenal glands are normal. Large right kidney cyst is identified measuring 2.4 cm with small thin areas of mural calcification. There is no mass or hydronephrosis identified. The urinary bladder appears normal. Stomach/Bowel: The stomach is unremarkable. No abnormal dilatation of the small or large bowel loops. The appendix is visualized and appears normal. Sigmoid diverticulosis noted without acute inflammation. Vascular/Lymphatic: Aortic atherosclerosis noted. No aneurysm. No upper abdominal or pelvic adenopathy. No inguinal adenopathy. Reproductive: Prostate is unremarkable. Other: No ascites or focal fluid collections identified. There is no evidence for retroperitoneal hematoma. Edema is identified compatible with anasarca which may be secondary to CHF. There is soft tissue edema involving both lower extremities. Musculoskeletal: Degenerative disc disease noted within the lumbar spine. IMPRESSION: 1. No evidence for hematoma. 2. Cardiac enlargement, Aortic Atherosclerosis (ICD10-I70.0). , pleural effusions, pulmonary edema, and anasarca compatible with congestive heart failure. 3. Right kidney cyst containing thin areas of mural calcification is incompletely characterized without IV contrast. Electronically Signed   By: Kerby Moors M.D.   On: 06/26/2016 15:46   Dg Chest 2 View  Result Date: 06/17/2016 CLINICAL DATA:  Lethargy and cough EXAM: CHEST  2 VIEW COMPARISON:  04/23/2014 FINDINGS: Cardiac shadow is stable. Pacing device is again noted. The lungs are well aerated bilaterally. No bony abnormality is noted. IMPRESSION: No active cardiopulmonary disease. Electronically Signed   By: Inez Catalina M.D.   On: 06/17/2016 20:38   Ct Chest Wo Contrast  Result Date: 06/18/2016 CLINICAL DATA:  Lethargy and cough for 2 days EXAM: CT CHEST WITHOUT CONTRAST TECHNIQUE: Multidetector CT imaging of the chest  was performed following the standard protocol without IV contrast. COMPARISON:  06/17/2016, CT 04/17/2016 FINDINGS: Cardiovascular: Limited evaluation without intravenous contrast. Aortic atherosclerosis. No aneurysmal dilatation. Coronary artery calcification. Partially visualized cardiac pacing leads. Mild cardiomegaly. No large pericardial effusion. Mediastinum/Nodes: Midline trachea. Few prominent sub- carinal and periesophageal lymph nodes, measuring up to 13 mm. Esophagus within normal limits. Esophagus Lungs/Pleura: Mild subpleural fibrosis. No focal consolidation, pleural effusion, or pneumothorax. Upper Abdomen: No acute abnormality. 9.6 cm cyst in the mid to upper right kidney. Vague hypodensity in the upper pole of the left kidney also consistent with a cyst Musculoskeletal: Degenerative changes of the spine. No acute or suspicious bone lesion. IMPRESSION: 1. Mild subpleural fibrosis. No acute pulmonary infiltrates are visualized. 2. Mild nonspecific mediastinal adenopathy 3. Cardiomegaly 4. Bilateral kidney cysts, measuring up to 9.6 cm on the right. Electronically Signed   By: Donavan Foil M.D.   On: 06/18/2016 00:26   US Renal  Result Date: 06/28/2016 CLINICAL DATA:  Renal failure. EXAM: RENAL / URINARY TRACT ULTRASOUND COMPLETE COMPARISON:  CT scan Jun 26, 2016 FINDINGS: Right Kidney: Length: 10.3 cm.  8.5 cm cyst.  Mild cortical thinning. Left Kidney: Length: 10.1 cm. Echogenicity within normal limits. No mass or hydronephrosis visualized. Bladder: Appears normal for degree of bladder distention. IMPRESSION: Right renal cyst.  No acute abnormality. Electronically Signed   By: Dorise Bullion III M.D   On: 06/28/2016 16:25   Dg Chest Port 1 View  Result Date: 06/23/2016 CLINICAL DATA:  Central line placement.  EXAM: PORTABLE CHEST 1 VIEW COMPARISON:  06/17/2016 FINDINGS: Right arm PICC has its tip in the proximal right atrium. Withdrawal of 2 cm if this is desired to be above the right atrium.  Pacemaker leads appear the same. Cardiomegaly. Chronic pulmonary scarring. IMPRESSION: Right arm PICC tip in the right atrium. Withdraw 2 cm to be at the SVC RA junction, if that is desired. Electronically Signed   By: Nelson Chimes M.D.   On: 06/23/2016 15:56   Dg Swallowing Func-speech Pathology  Result Date: 06/19/2016 Objective Swallowing Evaluation: Type of Study: MBS-Modified Barium Swallow Study Patient Details Name: CRUE OTERO MRN: [id number] Date of Birth: September 22, 1924 Today's Date: 06/19/2016 Time: SLP Start Time (ACUTE ONLY): 1520-SLP Stop Time (ACUTE ONLY): 1545 SLP Time Calculation (min) (ACUTE ONLY): 25 min Past Medical History: Past Medical History: Diagnosis Date . Anxiety  . Arthritis   R knee,  . CKD (chronic kidney disease)  . Coronary artery disease  . Diabetes mellitus without complication ([place])  . Dysrhythmia   PAF;,sss s/p St. Jude PPM . Hyperlipidemia  . Hypertension  . MI (myocardial infarction) ([place]) 2014 . Presence of permanent cardiac pacemaker  Past Surgical History: Past Surgical History: Procedure Laterality Date . CHOLECYSTECTOMY   . CORONARY ANGIOPLASTY    mLAD '13, RCA DES 03/2013 . EXCISIONAL TOTAL KNEE ARTHROPLASTY WITH ANTIBIOTIC SPACERS Right 04/20/2014  Procedure: IRRIGATION AND DEBRIDMENT RIGHT TOTAL KNEE REMOVE  ACL IMPLANTED AND PLACE SPACER;  Surgeon: [name], MD;  Location: [facility];  Service: Orthopedics;  Laterality: Right; . HEMORROIDECTOMY   . KNEE SURGERY  2006 . PACEMAKER INSERTION   . PICC LINE PLACE PERIPHERAL ([facility] [facility])  04/2014  R upper arm  . TOTAL KNEE ARTHROPLASTY WITH REVISION COMPONENTS Right 06/11/2014  Procedure: TOTAL KNEE ARTHROPLASTY WITH REVISION COMPONENTS REMOVE SPACER PLACE TKA;  Surgeon: [name], MD;  Location: [facility];  Service: Orthopedics;  Laterality: Right; HPI: pt admit to [facility] with AMS, cough- found to be bacterial septic.  PMH + for DM, MI, no CVA hx. CT chest and CXR negative.  Swallow eval ordered due to pt having dysphagia.  Subjective: pt  awake in chair Assessment / Plan / Recommendation CHL IP CLINICAL IMPRESSIONS 06/19/2016 Clinical Impression Pt presents with severe pharyngeal dysphagia - narrow pharynx and decreased tongue base retraction results in very poor epiglottic deflection and gross pharyngeal/vallecular residuals.  Pt requires multiple swallows (up to 5-6) with each bolus and still has vallecular residuals. He did not aspirate or penetrate surprisingly.  Residuals were much worse with solids/pudding than liquids.  Various strategies/postures including head turn left, multiple swallows, following solid with liquids mitigate dysphagia.  Pt did not fully clear vallecular space but can fortunately hock and expectorate to clear.  Pt reports his swallow ability during MBS is "normal" for him causing SLP to suspect baseline significant dysphagia that he has been managing.  Recommend strict precautions to mitigate aspiration risk and modify diet to full liquids. Using teach back, pt able to demonstrate multiple swallows, "hocking" and oral suctioning independently.  Will follow for po tolerance, readiness for dietary advancement.   SLP Visit Diagnosis Dysphagia, oropharyngeal phase (R13.12) Attention and concentration deficit following -- Frontal lobe and executive function deficit following -- Impact on safety and function Severe aspiration risk;Risk for inadequate nutrition/hydration   [facility] IP TREATMENT RECOMMENDATION 06/19/2016 Treatment Recommendations Therapy as outlined in treatment plan below   Prognosis 06/19/2016 Prognosis for Safe Diet Advancement Fair Barriers to Reach Goals Time post onset Barriers/Prognosis  Comment -- [facility] IP DIET RECOMMENDATION 06/19/2016 SLP Diet Recommendations Nectar thick liquid;Thin liquid Liquid Administration via Cup;Straw Medication Administration (No Data) Compensations Slow rate;Small sips/bites;Follow solids with liquid;Multiple dry swallows after each bite/sip Postural Changes Seated upright at 90 degrees;Remain  semi-upright after after feeds/meals (Comment)   [facility] IP OTHER RECOMMENDATIONS 06/19/2016 Recommended Consults -- Oral Care Recommendations Oral care before and after PO Other Recommendations --   [facility] IP FOLLOW UP RECOMMENDATIONS 06/19/2016 Follow up Recommendations (No Data)   [facility] IP FREQUENCY AND DURATION 06/19/2016 Speech Therapy Frequency (ACUTE ONLY) min 2x/week Treatment Duration 1 week      [facility] IP ORAL PHASE 06/19/2016 Oral Phase WFL Oral - Pudding Teaspoon -- Oral - Pudding Cup -- Oral - Honey Teaspoon -- Oral - Honey Cup -- Oral - Nectar Teaspoon -- Oral - Nectar Cup WFL Oral - Nectar Straw WFL Oral - Thin Teaspoon -- Oral - Thin Cup WFL Oral - Thin Straw WFL Oral - Puree WFL Oral - Mech Soft WFL Oral - Regular WFL Oral - Multi-Consistency -- Oral - Pill -- Oral Phase - Comment --  [facility] IP PHARYNGEAL PHASE 06/19/2016 Pharyngeal Phase Impaired Pharyngeal- Pudding Teaspoon -- Pharyngeal -- Pharyngeal- Pudding Cup -- Pharyngeal -- Pharyngeal- Honey Teaspoon -- Pharyngeal -- Pharyngeal- Honey Cup -- Pharyngeal -- Pharyngeal- Nectar Teaspoon -- Pharyngeal -- Pharyngeal- Nectar Cup Reduced pharyngeal peristalsis;Pharyngeal residue - valleculae;Reduced tongue base retraction Pharyngeal -- Pharyngeal- Nectar Straw Reduced pharyngeal peristalsis;Reduced tongue base retraction;Pharyngeal residue - valleculae Pharyngeal -- Pharyngeal- Thin Teaspoon -- Pharyngeal -- Pharyngeal- Thin Cup Reduced epiglottic inversion;Reduced tongue base retraction;Reduced pharyngeal peristalsis Pharyngeal -- Pharyngeal- Thin Straw Reduced pharyngeal peristalsis;Reduced epiglottic inversion;Pharyngeal residue - valleculae Pharyngeal -- Pharyngeal- Puree Reduced epiglottic inversion;Reduced pharyngeal peristalsis;Pharyngeal residue - valleculae Pharyngeal -- Pharyngeal- Mechanical Soft -- Pharyngeal -- Pharyngeal- Regular Reduced pharyngeal peristalsis;Reduced epiglottic inversion;Reduced tongue base retraction;Pharyngeal residue - valleculae  Pharyngeal -- Pharyngeal- Multi-consistency -- Pharyngeal -- Pharyngeal- Pill -- Pharyngeal -- Pharyngeal Comment pt without awareness to mild leftover vallecular residuals, he is able to clear/expectorate with cues   [facility] IP CERVICAL ESOPHAGEAL PHASE 06/19/2016 Cervical Esophageal Phase WFL Pudding Teaspoon -- Pudding Cup -- Honey Teaspoon -- Honey Cup -- Nectar Teaspoon -- Nectar Cup -- Nectar Straw -- Thin Teaspoon -- Thin Cup -- Thin Straw -- Puree -- Mechanical Soft -- Regular -- Multi-consistency -- Pill -- Cervical Esophageal Comment -- No flowsheet data found. Luanna Salk, MS [facility] SLP [phone]               Trooper Olander, DO  [facility] Pager [phone]  If 7PM-7AM, please contact night-coverage [email or web address] Password [email or web address] 06/28/2016, 4:44 PM   LOS: 11 days

## 2016-06-28 NOTE — Discharge Instructions (Addendum)

## 2016-06-29 DIAGNOSIS — N179 Acute kidney failure, unspecified: Secondary | ICD-10-CM

## 2016-06-29 DIAGNOSIS — N189 Chronic kidney disease, unspecified: Secondary | ICD-10-CM

## 2016-06-29 DIAGNOSIS — E1165 Type 2 diabetes mellitus with hyperglycemia: Secondary | ICD-10-CM

## 2016-06-29 DIAGNOSIS — N170 Acute kidney failure with tubular necrosis: Secondary | ICD-10-CM

## 2016-06-29 LAB — GLUCOSE, CAPILLARY
GLUCOSE-CAPILLARY: 196 mg/dL — AB (ref 65–99)
GLUCOSE-CAPILLARY: 136 mg/dL — AB (ref 65–99)
Glucose-Capillary: 143 mg/dL — ABNORMAL HIGH (ref 65–99)
Glucose-Capillary: 213 mg/dL — ABNORMAL HIGH (ref 65–99)

## 2016-06-29 LAB — SEDIMENTATION RATE: SED RATE: 35 mm/h — AB (ref 0–16)

## 2016-06-29 LAB — CBC
HCT: 23.7 % — ABNORMAL LOW (ref 39.0–52.0)
HEMOGLOBIN: 7.6 g/dL — AB (ref 13.0–17.0)
MCH: 27.9 pg (ref 26.0–34.0)
MCHC: 32.1 g/dL (ref 30.0–36.0)
MCV: 87.1 fL (ref 78.0–100.0)
Platelets: 563 10*3/uL — ABNORMAL HIGH (ref 150–400)
RBC: 2.72 MIL/uL — ABNORMAL LOW (ref 4.22–5.81)
RDW: 19 % — ABNORMAL HIGH (ref 11.5–15.5)
WBC: 17.1 10*3/uL — ABNORMAL HIGH (ref 4.0–10.5)

## 2016-06-29 LAB — C-REACTIVE PROTEIN: CRP: 4 mg/dL — ABNORMAL HIGH (ref <1.0)

## 2016-06-29 LAB — BASIC METABOLIC PANEL
ANION GAP: 8 (ref 5–15)
BUN: 10 mg/dL (ref 6–20)
CALCIUM: 8.2 mg/dL — AB (ref 8.9–10.3)
CO2: 24 mmol/L (ref 22–32)
Chloride: 107 mmol/L (ref 101–111)
Creatinine, Ser: 2.18 mg/dL — ABNORMAL HIGH (ref 0.61–1.24)
GFR, EST AFRICAN AMERICAN: 29 mL/min — AB (ref >60)
GFR, EST NON AFRICAN AMERICAN: 25 mL/min — AB (ref >60)
GLUCOSE: 162 mg/dL — AB (ref 65–99)
Potassium: 3.4 mmol/L — ABNORMAL LOW (ref 3.5–5.1)
SODIUM: 139 mmol/L (ref 135–145)

## 2016-06-29 MED ORDER — SODIUM CHLORIDE 0.9 % IV SOLN
3.0000 g | Freq: Two times a day (BID) | INTRAVENOUS | Status: DC
  Administered 2016-06-29 – 2016-07-06 (×14): 3 g via INTRAVENOUS
  Filled 2016-06-29 (×15): qty 3

## 2016-06-29 NOTE — Clinical Social Work Note (Signed)
LCSW sent patient information through the [facility] for rehab placement.  Dede Query, [place] Worker - cell #: [phone]

## 2016-06-29 NOTE — Progress Notes (Signed)
PROGRESS NOTE  Randy [name] TGP:[id number] DOB: 05-20-1924 DOA: 06/17/2016 PCP: System, Pcp Not In  Brief History: 83 y.o.gentleman with a history of mild dementia/cognitive impairment, CAD with prior stent placement, history of atrial fibrillation S/P PPM implant (CHADS-Vasc score of at least four, no anticoagulation), HTN, HLD, DM, and CKD 3 with baseline creatinine around 1.3-1.5 who was referred to the ED via EMS by one of his caregivers for evaluation of increased lethargy, mental status changes, and cough. The patient is nonambulatory at baseline and spends most of his day sitting up a recliner. He has caregiver support 24 hours/day. He has had cough for 4-5 days. He has had two known sick contacts (son-in-law and caregiver). He has had yellow sputum. No chest pain or shortness of breath. No nausea or vomiting/diarrhea. No abdominal pain. No dysuria/hematuria On 06/17/16, he was noted to have increased lethargy and muffled speech with slurring. No other focal weakenss. 911 was called and the patient was transferred to the ED for evaluation. No recent hospitalizations. No recent antibiotics. He has been using OTC mucinex for his symptoms.  Assessment/Plan: Sepsis secondary to MSSA/Enterococcus bacteremia/MSSA Right prosthetic knee infection -Secondary to bacteremia and prosthetic knee infection -Presented with fever, tachycardia, tachypnea, leukocytosis -Patient also had elevated pro calcitonin level as well as lactic acid -Initially placed on vancomycin and zosyn, and transitioned to Unasyn -Chest x-ray, UA unremarkable for infection  -CT chest showed subpleural fibrosis, no acute pulmonary infiltrates. -Influenza PCR, strep pneumonia urine antigen negative -Blood cultures 06/17/2016: MSSA, enterococcus  -sepsis physiology resolved  MSSA/Enterococcus bacteremia/MSSA Right prosthetic knee infection -Blood cultures 06/17/2016: MSSA, E faecalis -Repeat blood  cultures 06/19/16 no growth to date -Echocardiogram (TTE)showed EF 55-60%, moderate AS, trivial MR/TR/AI -Infectious disease consulted and appreciated, recommended TEE and EP consult -Cardiology consulted and appreciated- TEE attempted multiple times on 06/22/16-  -Per cardiology, patient is at high risk of complication due to high risk comorbidities. No plan for device extraction at this time. -Orthopedic surgery consulted and appreciated for infected knee. S/p aspiration x2-->grew MSSA -Patient seen by Dr. Mayer Camel (ortho), patient would need an AKA, however patient seen as a poor surgical candidate. Ortho has discussed this with the patient's son -Patient will need IV unasyn for 6 weeks, and followed by PO amox/clavfor suppression thereafter. -06/22/16 PICC line -06/26/16--case discussed with ortho--no plans for surgery--monitor for serial knee aspirations, f/u office in one week after d/c; repeated R-knee aspiration 5/11, 5/13 -06/27/16--repeat blood culture as WBC trending up--remains negative -5/14--reconsult ID -ESR--35, CRP--4.0  Right and Leftlower extremity DVT -clinically provoked, but son states that pt has had 2 or 3 previous episodes of DVTs in past -son unclear why pt is not on chronic anticoagulation -LE doppler--acute DVT Right common femoral, femoral, and popliteal veinsand Left common femoral and femoral vein -startedIV heparin -transitioned to po apixaban on 06/27/16  Dysphagia -Speech consulted and recommended FULL liquids due to abnormal MBS results -will ask speech to re-evaluation again-->Dys 2 diet -son states pt was on regular diet prior to admission but did require food to be chopped up -discussed with son--he does not want to restrict diet, but does not seem to full accept or fully understand the risk of aspiration--"he's always eaten solid food without problems"  Acute Blood Loss Anemia -drop ~4grams Hgb in past 7days -FOBT--neg -GI consult appreciated-->bid  PPI, no further intervention -CT abd/pelvis-neg for retroperitoneal bleed -LDH/Haptoglobin--no suggestive of hemolysis -repeat CBC in am  Anasarca/Fluid  Excess -5/12-lasix 40 mg IV x 1 -5/13-lasix 20 mg IV x 1  Acute on Chronic kidney disease, stage III -Creatinine baseline at 1.3-1.6 -Serum creatinine peaked 2.18 -Creatinine trending  up--likely fluid shifts, infectious process, -UA is bland -renal US--neg for hydronephrosis -Am BMP -consult nephrology  Diarrhea -check Cdiff--negative -continue Flexiseal -slowing down/improved  Dermatophytosis/tinea cruris -Groin area appears to be erythematous and flaky -discontinuediflucan--pt finished one week -continue mycostatin topical -suspect pt has component of incontinence dermatitis -improved with condom cath  Essential hypertension -Currently on no home medications, continue IV hydralazine PRN  Diabetes mellitus, type II -Currently on no home medications -Continue insulin sliding scale, Levemir, CBG monitoring -06/25/16 check A1C--11.5 -increasenovolog 10units with meals -increase levemir to 10units bid  History of atrial fibrillation -CHADSVASC 5 (based on age, DM, HTN, CAD) -previously not on [facility] -Status post pacemaker placement- cardiology consulted as patient does have bacteremia, as above, no plan for device extraction at this time.   Stage II pressure injury -moisture related -wound care consulted and appreciated -not infected on exam  Moderate aortic stenosis -Noted on echocardiogram  Cognitive impairment -Per son patient can sometimes be clear minded but is not always  Hypokalemia -K 3.0, continue to replete -Monitor BMP -mag= 1.9  Hypomagnesemia -Magnesium 1.9 now  Goals of care -Had discuss with son via phone regarding patient, current treatment and plan, code status, mental status. Per son, patient's mental status is not always clear and at this time he would like to remain full  code and possible have surgeries that might be needed. -Cardio and ortho recommend palliative care consult. Discussed with son, does not feel palliative care consult is needed right now.  DVT Prophylaxislovenox  Code Status:Full  Family Communication:updated son on phone--Total time spent 35 minutes.  Greater than 50% spent face to face counseling and coordinating care.   Disposition Plan:Home when serum creatinine or WBC stabilizes   Consultants Infectious disease Cardiology Orthopedics  Procedures  Echocardiogram Right knee aspiration x5 Attempted TEE  Antibiotics vanco 5/2>>>5/6 Unasyn 5/7>>> Nafcillin 5/3>>5/7   Subjective: Patient continues to complain of right knee pain. He states it is about the same as usual. Denies any headache, chest pain, shortness breath, coughing, hemoptysis, nausea, vomiting, abdominal pain. No dysuria or hematuria.  Objective: Vitals:   06/28/16 0516 06/28/16 1329 06/28/16 2206 06/29/16 0630  BP: (!) 147/55 120/66 (!) 119/47 139/66  Pulse: 75 74 79 70  Resp: '18 18 18 20  '$ Temp: 98.5 F (36.9 C) 98.3 F (36.8 C) 98.4 F (36.9 C) 98.1 F (36.7 C)  TempSrc: Oral Oral Oral Oral  SpO2: 98% 98% 98% 99%  Weight:      Height:        Intake/Output Summary (Last 24 hours) at 06/29/16 0919 Last data filed at 06/29/16 0631  Gross per 24 hour  Intake          1025.17 ml  Output             1750 ml  Net          -724.83 ml   Weight change:  Exam:   General:  Pt is alert, follows commands appropriately, not in acute distress  HEENT: No icterus, No thrush, No neck mass, [place]/AT  Cardiovascular: RRR, S1/S2, no rubs, no gallops  Respiratory: bibasilar crackles  Abdomen: Soft/+BS, non tender, non distended, no guarding  Extremities: 1 + LE edema, No lymphangitis, No petechiae, No rashes, no synovitis   Data Reviewed: I have personally reviewed  following labs and imaging studies Basic Metabolic Panel:  Recent  Labs Lab 06/23/16 0349  06/25/16 0435 06/26/16 0409 06/27/16 0546 06/28/16 0338 06/29/16 0420  NA  --   < > 138 139 138 138 139  K  --   < > 3.4* 3.5 3.5 3.5 3.4*  CL  --   < > 110 108 107 108 107  CO2  --   < > '22 22 24 23 24  '$ GLUCOSE  --   < > 140* 202* 224* 218* 162*  BUN  --   < > '15 13 11 10 10  '$ CREATININE  --   < > 1.71* 1.80* 1.89* 2.04* 2.18*  CALCIUM  --   < > 7.7* 7.9* 7.9* 7.9* 8.2*  MG 1.9  --  1.8  --   --  1.9  --   < > = values in this interval not displayed. Liver Function Tests:  Recent Labs Lab 06/27/16 0546  AST 14*  ALT 6*  ALKPHOS 52  BILITOT 1.1  PROT 5.8*  ALBUMIN 1.9*   No results for input(s): LIPASE, AMYLASE in the last 168 hours. No results for input(s): AMMONIA in the last 168 hours. Coagulation Profile: No results for input(s): INR, PROTIME in the last 168 hours. CBC:  Recent Labs Lab 06/26/16 0409 06/26/16 1629 06/27/16 0542 06/27/16 1158 06/28/16 0338 06/29/16 0420  WBC 13.8* 14.7* 15.1* 16.0* 17.1* 17.1*  NEUTROABS 8.4*  --   --   --  12.2*  --   HGB 8.6* 8.0* 8.0* 7.7* 7.9* 7.6*  HCT 25.7* 24.6* 24.1* 23.5* 23.7* 23.7*  MCV 84.5 83.7 84.3 84.5 85.6 87.1  PLT 527* 514* 507* 523* 556* 563*   Cardiac Enzymes: No results for input(s): CKTOTAL, CKMB, CKMBINDEX, TROPONINI in the last 168 hours. BNP: Invalid input(s): POCBNP CBG:  Recent Labs Lab 06/28/16 0729 06/28/16 1221 06/28/16 1648 06/28/16 2205 06/29/16 0807  GLUCAP 148* 245* 192* 184* 143*   HbA1C: No results for input(s): HGBA1C in the last 72 hours. Urine analysis:    Component Value Date/Time   COLORURINE STRAW (A) 06/27/2016 1541   APPEARANCEUR CLEAR 06/27/2016 1541   LABSPEC 1.008 06/27/2016 1541   PHURINE 6.0 06/27/2016 1541   GLUCOSEU 150 (A) 06/27/2016 1541   HGBUR NEGATIVE 06/27/2016 1541   BILIRUBINUR NEGATIVE 06/27/2016 1541   KETONESUR NEGATIVE 06/27/2016 1541   PROTEINUR NEGATIVE 06/27/2016 1541   UROBILINOGEN 0.2 05/18/2014 1634   NITRITE  NEGATIVE 06/27/2016 1541   LEUKOCYTESUR NEGATIVE 06/27/2016 1541   Sepsis Labs: '@LABRCNTIP'$ (procalcitonin:4,lacticidven:4) ) Recent Results (from the past 240 hour(s))  Culture, blood (Routine X 2) w Reflex to ID Panel     Status: None   Collection Time: 06/19/16 11:08 AM  Result Value Ref Range Status   Specimen Description BLOOD RIGHT ARM  Final   Special Requests IN PEDIATRIC BOTTLE Blood Culture adequate volume  Final   Culture   Final    NO GROWTH 5 DAYS Performed at [facility], [place] [address] [place], [place] [address]    Report Status 06/24/2016 FINAL  Final  Culture, blood (Routine X 2) w Reflex to ID Panel     Status: None   Collection Time: 06/19/16 11:08 AM  Result Value Ref Range Status   Specimen Description BLOOD RIGHT HAND  Final   Special Requests IN PEDIATRIC BOTTLE Blood Culture adequate volume  Final   Culture   Final    NO GROWTH 5 DAYS Performed at [facility], [place] [address] [place], [place] [address]    Report Status 06/24/2016 FINAL  Final  Body fluid culture     Status: None   Collection Time: 06/19/16 12:25 PM  Result Value Ref Range Status   Specimen Description SYNOVIAL RIGHT KNEE  Final   Special Requests NONE  Final   Gram Stain   Final    MODERATE WBC PRESENT,BOTH PMN AND MONONUCLEAR FEW GRAM POSITIVE COCCI IN PAIRS    Culture   Final    MODERATE STAPHYLOCOCCUS AUREUS NO ANAEROBES ISOLATED Performed at [facility], [place] [address], [place], [place] [address]    Report Status 06/22/2016 FINAL  Final   Organism ID, Bacteria STAPHYLOCOCCUS AUREUS  Final      Susceptibility   Staphylococcus aureus - MIC*    CIPROFLOXACIN <=0.5 SENSITIVE Sensitive     ERYTHROMYCIN <=0.25 SENSITIVE Sensitive     GENTAMICIN <=0.5 SENSITIVE Sensitive     OXACILLIN <=0.25 SENSITIVE Sensitive     TETRACYCLINE <=1 SENSITIVE Sensitive     VANCOMYCIN <=0.5 SENSITIVE Sensitive     TRIMETH/SULFA <=10 SENSITIVE Sensitive     CLINDAMYCIN <=0.25  SENSITIVE Sensitive     RIFAMPIN <=0.5 SENSITIVE Sensitive     Inducible Clindamycin NEGATIVE Sensitive     * MODERATE STAPHYLOCOCCUS AUREUS  C difficile quick scan w PCR reflex     Status: None   Collection Time: 06/26/16 11:47 AM  Result Value Ref Range Status   C Diff antigen NEGATIVE NEGATIVE Final   C Diff toxin NEGATIVE NEGATIVE Final   C Diff interpretation No C. difficile detected.  Final  Culture, Urine     Status: None   Collection Time: 06/27/16  3:41 PM  Result Value Ref Range Status   Specimen Description URINE, CLEAN CATCH  Final   Special Requests NONE  Final   Culture   Final    NO GROWTH Performed at [facility], [address] [place], [place] [address]    Report Status 06/28/2016 FINAL  Final     Scheduled Meds: . apixaban  10 mg Oral BID   Followed by  . [START ON 07/04/2016] apixaban  5 mg Oral BID  . chlorhexidine  15 mL Mouth Rinse BID  . Chlorhexidine Gluconate Cloth  6 each Topical Q0600  . feeding supplement  1 Container Oral BID BM  . Gerhardt's butt cream   Topical TID  . insulin aspart  0-20 Units Subcutaneous TID WC  . insulin aspart  0-5 Units Subcutaneous QHS  . insulin aspart  10 Units Subcutaneous TID WC  . insulin detemir  10 Units Subcutaneous BID  . mouth rinse  15 mL Mouth Rinse q12n4p  . mupirocin ointment  1 application Nasal BID  . pantoprazole  40 mg Oral BID AC  . sodium chloride flush  10-40 mL Intracatheter Q12H   Continuous Infusions: . sodium chloride 10 mL/hr at 06/28/16 2300  . sodium chloride    . ampicillin-sulbactam (UNASYN) IV      Procedures/Studies: Ct Abdomen Pelvis Wo Contrast  Result Date: 06/26/2016 CLINICAL DATA:  Evaluate for retroperitoneal hematoma. Acute blood loss/anemia. EXAM: CT ABDOMEN AND PELVIS WITHOUT CONTRAST TECHNIQUE: Multidetector CT imaging of the abdomen and pelvis was performed following the standard protocol without IV contrast. COMPARISON:  04/17/2013 FINDINGS: Lower chest: There are  small bilateral pleural effusions identified peer increased interstitial markings within the lung bases compatible with pulmonary edema. The heart size appears enlarged. Aortic  atherosclerosis identified. RCA coronary artery calcifications are identified. Hepatobiliary: No focal liver abnormality. Previous cholecystectomy. No biliary dilatation. Pancreas: Unremarkable. No pancreatic ductal dilatation or surrounding inflammatory changes. Spleen: Lobulated contour the spleen noted. Several accessory spleens are identified. Findings may reflect sequelae of prior splenic injury. Adrenals/Urinary Tract: The adrenal glands are normal. Large right kidney cyst is identified measuring 2.4 cm with small thin areas of mural calcification. There is no mass or hydronephrosis identified. The urinary bladder appears normal. Stomach/Bowel: The stomach is unremarkable. No abnormal dilatation of the small or large bowel loops. The appendix is visualized and appears normal. Sigmoid diverticulosis noted without acute inflammation. Vascular/Lymphatic: Aortic atherosclerosis noted. No aneurysm. No upper abdominal or pelvic adenopathy. No inguinal adenopathy. Reproductive: Prostate is unremarkable. Other: No ascites or focal fluid collections identified. There is no evidence for retroperitoneal hematoma. Edema is identified compatible with anasarca which may be secondary to CHF. There is soft tissue edema involving both lower extremities. Musculoskeletal: Degenerative disc disease noted within the lumbar spine. IMPRESSION: 1. No evidence for hematoma. 2. Cardiac enlargement, Aortic Atherosclerosis (ICD10-I70.0). , pleural effusions, pulmonary edema, and anasarca compatible with congestive heart failure. 3. Right kidney cyst containing thin areas of mural calcification is incompletely characterized without IV contrast. Electronically Signed   By: Kerby Moors M.D.   On: 06/26/2016 15:46   Dg Chest 2 View  Result Date:  06/17/2016 CLINICAL DATA:  Lethargy and cough EXAM: CHEST  2 VIEW COMPARISON:  04/23/2014 FINDINGS: Cardiac shadow is stable. Pacing device is again noted. The lungs are well aerated bilaterally. No bony abnormality is noted. IMPRESSION: No active cardiopulmonary disease. Electronically Signed   By: Inez Catalina M.D.   On: 06/17/2016 20:38   Ct Chest Wo Contrast  Result Date: 06/18/2016 CLINICAL DATA:  Lethargy and cough for 2 days EXAM: CT CHEST WITHOUT CONTRAST TECHNIQUE: Multidetector CT imaging of the chest was performed following the standard protocol without IV contrast. COMPARISON:  06/17/2016, CT 04/17/2016 FINDINGS: Cardiovascular: Limited evaluation without intravenous contrast. Aortic atherosclerosis. No aneurysmal dilatation. Coronary artery calcification. Partially visualized cardiac pacing leads. Mild cardiomegaly. No large pericardial effusion. Mediastinum/Nodes: Midline trachea. Few prominent sub- carinal and periesophageal lymph nodes, measuring up to 13 mm. Esophagus within normal limits. Esophagus Lungs/Pleura: Mild subpleural fibrosis. No focal consolidation, pleural effusion, or pneumothorax. Upper Abdomen: No acute abnormality. 9.6 cm cyst in the mid to upper right kidney. Vague hypodensity in the upper pole of the left kidney also consistent with a cyst Musculoskeletal: Degenerative changes of the spine. No acute or suspicious bone lesion. IMPRESSION: 1. Mild subpleural fibrosis. No acute pulmonary infiltrates are visualized. 2. Mild nonspecific mediastinal adenopathy 3. Cardiomegaly 4. Bilateral kidney cysts, measuring up to 9.6 cm on the right. Electronically Signed   By: Donavan Foil M.D.   On: 06/18/2016 00:26   US Renal  Result Date: 06/28/2016 CLINICAL DATA:  Renal failure. EXAM: RENAL / URINARY TRACT ULTRASOUND COMPLETE COMPARISON:  CT scan Jun 26, 2016 FINDINGS: Right Kidney: Length: 10.3 cm.  8.5 cm cyst.  Mild cortical thinning. Left Kidney: Length: 10.1 cm. Echogenicity  within normal limits. No mass or hydronephrosis visualized. Bladder: Appears normal for degree of bladder distention. IMPRESSION: Right renal cyst.  No acute abnormality. Electronically Signed   By: Dorise Bullion III M.D   On: 06/28/2016 16:25   Dg Chest Port 1 View  Result Date: 06/23/2016 CLINICAL DATA:  Central line placement. EXAM: PORTABLE CHEST 1 VIEW COMPARISON:  06/17/2016 FINDINGS: Right arm PICC has its  tip in the proximal right atrium. Withdrawal of 2 cm if this is desired to be above the right atrium. Pacemaker leads appear the same. Cardiomegaly. Chronic pulmonary scarring. IMPRESSION: Right arm PICC tip in the right atrium. Withdraw 2 cm to be at the SVC RA junction, if that is desired. Electronically Signed   By: Nelson Chimes M.D.   On: 06/23/2016 15:56   Dg Swallowing Func-speech Pathology  Result Date: 06/19/2016 Objective Swallowing Evaluation: Type of Study: MBS-Modified Barium Swallow Study Patient Details Name: Randy [name] MRN: [id number] Date of Birth: Apr 30, 1924 Today's Date: 06/19/2016 Time: SLP Start Time (ACUTE ONLY): 1520-SLP Stop Time (ACUTE ONLY): 1545 SLP Time Calculation (min) (ACUTE ONLY): 25 min Past Medical History: Past Medical History: Diagnosis Date . Anxiety  . Arthritis   R knee,  . CKD (chronic kidney disease)  . Coronary artery disease  . Diabetes mellitus without complication ([place])  . Dysrhythmia   PAF;,sss s/p St. Jude PPM . Hyperlipidemia  . Hypertension  . MI (myocardial infarction) ([place]) 2014 . Presence of permanent cardiac pacemaker  Past Surgical History: Past Surgical History: Procedure Laterality Date . CHOLECYSTECTOMY   . CORONARY ANGIOPLASTY    mLAD '13, RCA DES 03/2013 . EXCISIONAL TOTAL KNEE ARTHROPLASTY WITH ANTIBIOTIC SPACERS Right 04/20/2014  Procedure: IRRIGATION AND DEBRIDMENT RIGHT TOTAL KNEE REMOVE  ACL IMPLANTED AND PLACE SPACER;  Surgeon: [name], MD;  Location: [facility];  Service: Orthopedics;  Laterality: Right; . HEMORROIDECTOMY   . KNEE SURGERY   2006 . PACEMAKER INSERTION   . PICC LINE PLACE PERIPHERAL ([place] HX)  04/2014  R upper arm  . TOTAL KNEE ARTHROPLASTY WITH REVISION COMPONENTS Right 06/11/2014  Procedure: TOTAL KNEE ARTHROPLASTY WITH REVISION COMPONENTS REMOVE SPACER PLACE TKA;  Surgeon: [name], MD;  Location: [facility];  Service: Orthopedics;  Laterality: Right; HPI: pt admit to [facility] with AMS, cough- found to be bacterial septic.  PMH + for DM, MI, no CVA hx. CT chest and CXR negative.  Swallow eval ordered due to pt having dysphagia.  Subjective: pt awake in chair Assessment / Plan / Recommendation CHL IP CLINICAL IMPRESSIONS 06/19/2016 Clinical Impression Pt presents with severe pharyngeal dysphagia - narrow pharynx and decreased tongue base retraction results in very poor epiglottic deflection and gross pharyngeal/vallecular residuals.  Pt requires multiple swallows (up to 5-6) with each bolus and still has vallecular residuals. He did not aspirate or penetrate surprisingly.  Residuals were much worse with solids/pudding than liquids.  Various strategies/postures including head turn left, multiple swallows, following solid with liquids mitigate dysphagia.  Pt did not fully clear vallecular space but can fortunately hock and expectorate to clear.  Pt reports his swallow ability during MBS is "normal" for him causing SLP to suspect baseline significant dysphagia that he has been managing.  Recommend strict precautions to mitigate aspiration risk and modify diet to full liquids. Using teach back, pt able to demonstrate multiple swallows, "hocking" and oral suctioning independently.  Will follow for po tolerance, readiness for dietary advancement.   SLP Visit Diagnosis Dysphagia, oropharyngeal phase (R13.12) Attention and concentration deficit following -- Frontal lobe and executive function deficit following -- Impact on safety and function Severe aspiration risk;Risk for inadequate nutrition/hydration   CHL IP TREATMENT RECOMMENDATION 06/19/2016  Treatment Recommendations Therapy as outlined in treatment plan below   Prognosis 06/19/2016 Prognosis for Safe Diet Advancement Fair Barriers to Reach Goals Time post onset Barriers/Prognosis Comment -- CHL IP DIET RECOMMENDATION 06/19/2016 SLP Diet Recommendations Nectar thick liquid;Thin liquid  Liquid Administration via Cup;Straw Medication Administration (No Data) Compensations Slow rate;Small sips/bites;Follow solids with liquid;Multiple dry swallows after each bite/sip Postural Changes Seated upright at 90 degrees;Remain semi-upright after after feeds/meals (Comment)   CHL IP OTHER RECOMMENDATIONS 06/19/2016 Recommended Consults -- Oral Care Recommendations Oral care before and after PO Other Recommendations --   CHL IP FOLLOW UP RECOMMENDATIONS 06/19/2016 Follow up Recommendations (No Data)   CHL IP FREQUENCY AND DURATION 06/19/2016 Speech Therapy Frequency (ACUTE ONLY) min 2x/week Treatment Duration 1 week      CHL IP ORAL PHASE 06/19/2016 Oral Phase WFL Oral - Pudding Teaspoon -- Oral - Pudding Cup -- Oral - Honey Teaspoon -- Oral - Honey Cup -- Oral - Nectar Teaspoon -- Oral - Nectar Cup WFL Oral - Nectar Straw WFL Oral - Thin Teaspoon -- Oral - Thin Cup WFL Oral - Thin Straw WFL Oral - Puree WFL Oral - Mech Soft WFL Oral - Regular WFL Oral - Multi-Consistency -- Oral - Pill -- Oral Phase - Comment --  CHL IP PHARYNGEAL PHASE 06/19/2016 Pharyngeal Phase Impaired Pharyngeal- Pudding Teaspoon -- Pharyngeal -- Pharyngeal- Pudding Cup -- Pharyngeal -- Pharyngeal- Honey Teaspoon -- Pharyngeal -- Pharyngeal- Honey Cup -- Pharyngeal -- Pharyngeal- Nectar Teaspoon -- Pharyngeal -- Pharyngeal- Nectar Cup Reduced pharyngeal peristalsis;Pharyngeal residue - valleculae;Reduced tongue base retraction Pharyngeal -- Pharyngeal- Nectar Straw Reduced pharyngeal peristalsis;Reduced tongue base retraction;Pharyngeal residue - valleculae Pharyngeal -- Pharyngeal- Thin Teaspoon -- Pharyngeal -- Pharyngeal- Thin Cup Reduced epiglottic  inversion;Reduced tongue base retraction;Reduced pharyngeal peristalsis Pharyngeal -- Pharyngeal- Thin Straw Reduced pharyngeal peristalsis;Reduced epiglottic inversion;Pharyngeal residue - valleculae Pharyngeal -- Pharyngeal- Puree Reduced epiglottic inversion;Reduced pharyngeal peristalsis;Pharyngeal residue - valleculae Pharyngeal -- Pharyngeal- Mechanical Soft -- Pharyngeal -- Pharyngeal- Regular Reduced pharyngeal peristalsis;Reduced epiglottic inversion;Reduced tongue base retraction;Pharyngeal residue - valleculae Pharyngeal -- Pharyngeal- Multi-consistency -- Pharyngeal -- Pharyngeal- Pill -- Pharyngeal -- Pharyngeal Comment pt without awareness to mild leftover vallecular residuals, he is able to clear/expectorate with cues   CHL IP CERVICAL ESOPHAGEAL PHASE 06/19/2016 Cervical Esophageal Phase WFL Pudding Teaspoon -- Pudding Cup -- Honey Teaspoon -- Honey Cup -- Nectar Teaspoon -- Nectar Cup -- Nectar Straw -- Thin Teaspoon -- Thin Cup -- Thin Straw -- Puree -- Mechanical Soft -- Regular -- Multi-consistency -- Pill -- Cervical Esophageal Comment -- No flowsheet data found. Luanna Salk, MS [facility] SLP [phone]               Sonoma Firkus, DO  [facility] Pager [phone]  If 7PM-7AM, please contact night-coverage [email or web address] Password [email or web address] 06/29/2016, 9:19 AM   LOS: 12 days

## 2016-06-29 NOTE — Progress Notes (Addendum)
  Speech Language Pathology Treatment:    Patient Details Name: YOSEF KROGH MRN: [id number] DOB: May 28, 1924 Today's Date: 06/29/2016 Time: 9470-9628 SLP Time Calculation (min) (ACUTE ONLY): 40 min  Assessment / Plan / Recommendation Clinical Impression  Pt reports good tolerance of intake stating "I don't know of any problems with it" regarding swallowing.  He was leaning to the right upon repositioning and stated "I reckon it's more comfortable that way" when inquired as to reasoning.    Max cues to state swallowing precautions needed as pt states "I just need to be careful so I don't get choked".  However functionally during po, pt conducting swallow strategies except for "hock" and expectoration.  Pt able to easily follow directions with max cueing however.    Observed pt consuming graham cracker, jello and thin water.    Multiple swallows with each bolus observed but pt required max cues to hock and expectorate.  This strategy effectively cleared jello from pharynx.   SlP wrote out specific instructions for pt to use for airway protection, provided pt with written copies of detailed instruction.  MD please order follow up SLP [facility] briefly for dysphagia management.     HPI HPI: pt admit to [facility] with AMS, cough- found to be bacterial septic.  PMH + for DM, MI, no CVA hx. CT chest and CXR negative.  Swallow eval ordered due to pt having dysphagia.       SLP Plan  Continue with current plan of care       Recommendations  Diet recommendations: Dysphagia 2 (fine chop);Thin liquid Liquids provided via: Cup Medication Administration: Crushed with puree Supervision: Full supervision/cueing for compensatory strategies Compensations: Slow rate;Small sips/bites;Multiple dry swallows after each bite/sip;Follow solids with liquid ("hock" to clear pharynx of residuals pt does not sense) Postural Changes and/or Swallow Maneuvers: Seated upright 90 degrees;Upright 30-60 min after meal                 Oral Care Recommendations: Oral care BID Follow up Recommendations: None SLP Visit Diagnosis: Dysphagia, oropharyngeal phase (R13.12) Plan: Continue with current plan of care       [place], [place], [facility] SLP [phone]

## 2016-06-29 NOTE — Progress Notes (Signed)
Pharmacy Antibiotic Note  Randy [name] is a 83 y.o. male with pacemaker and prosthetic knee admitted on 06/17/2016 with sepsis. ID narrowing antibiotic therapy today from Nafcillin and Vancomyin to Unasyn for right prosthetic joint infection and bacteremia due to MSSA, CoNS, and ampicillin sensitive Enterococcus. TTE negative for vegetations. Unable to perform TEE.  Not surgical candidate for AKA.  ID recommends to treat with 6 weeks of IV abx (thru 07/30/16) and then transition to augmentin indefinitely for suppression.  - PICC placed on 06/23/16  Today, 06/29/16:  - day #12 of 42 of abx - afeb - WBC remains increased - SCr increasing, up to 2.18 (crcl~27 ml/min)  Plan: - For CrCl < 30 ml.min, adjust  Unasyn to 3g IV q12h. - Monitor renal function closely  _________________________  Height: 5\' 11"  (180.3 cm) Weight: 212 lb 4.9 oz (96.3 kg) IBW/kg (Calculated) : 75.3  Temp (24hrs), Avg:98.3 F (36.8 C), Min:98.1 F (36.7 C), Max:98.4 F (36.9 C)   Recent Labs Lab 06/25/16 0435 06/26/16 0409 06/26/16 1629 06/27/16 0542 06/27/16 0546 06/27/16 1158 06/28/16 0338 06/29/16 0420  WBC  --  13.8* 14.7* 15.1*  --  16.0* 17.1* 17.1*  CREATININE 1.71* 1.80*  --   --  1.89*  --  2.04* 2.18*    Estimated Creatinine Clearance: 26.7 mL/min (A) (by C-G formula based on SCr of 2.18 mg/dL (H)).    No Known Allergies  Antimicrobials this admission:  5/2 vanc >> 5/7 5/2 zosyn >> 5/3 5/3 naficillin >> 5/7 5/3 fluconazole >> 5/10 5/7 unasyn >> (6/15)   Dose adjustments this admission:  5/5 2130 VT: 14 mg/L, continue Vanc 1 Gm IV q24h f/u scr daily  Microbiology results:  5/2 BCx: 1/2 with MSSA, Enterococcus faecalis (ampicillin sensitive); 1/2 with CoNS FINAL 5/2 influenza PCR: neg 5/2 strep/legionella ur ag: neg 5/3 MRSA PCR: positive 5/3 Trach aspirate: c/w normal respiratory flora 5/3 BCx: 1/2 Staph aureus (BCID = MSSA) FINAL 5/4 synovial fluid from R knee: moderate MSSA  FINAL 5/4 BCx x2: neg FINAL   Thank you for allowing pharmacy to be a part of this patient's care.  Doreene Eland, PharmD, BCPS.   Pager: [phone] 06/29/2016 8:15 AM

## 2016-06-29 NOTE — Consult Note (Signed)
Renal Service Consult Note [facility]  Randy [name] 06/29/2016 [name] D Requesting Physician:  Dr. Carles Collet  Reason for Consult:  Renal failure HPI: The patient is a 83 y.o. year-old with history of MI, HTN, PPM, HL, DM2, CKD and DJD who presented to ED on 5/2 with gen'd weakness, confusion and cough. Blood cx's grew enterococcus and MSSA and patient was found to have R prosthetic knee joint infection.  Ortho initially rec'd AKA but then he was felt to be too sick for major surgery and is now being treated with prolonged course of abx per ID.  Also has implanted PPM, but felt to be too high risk for removal by cardiology.  Creat was 1.3 here in March, 1.8 on admission here on May 2nd, and has risen slowly up to 2.18 today. Asked to see for acute/ chronic renal failure.    Patient got IV lasix 40 mg yest and 20 mg today, responded well with 3.5 L UOP yest and 1.75 L so far today. I/O since admission 5/2 > 23 L in and 16 L out (+7 L)   Wts = 81 kg on admission 5/2 and 96kg on 5/8 (last wt)  Patient has no c/o's at this time.  Denies history of kidney problems, voiding issues or prostate disease. deneis any SOB, orthopnea. +leg swelling.    ROS  denies CP  no HA  no blurry vision  no rash  no diarrhea  no nausea/ vomiting  no dysuria  no difficulty voiding  no change in urine color    Past Medical History  Past Medical History:  Diagnosis Date  . Anxiety   . Arthritis    R knee,   . CKD (chronic kidney disease)   . Coronary artery disease   . Diabetes mellitus without complication ([place])   . Dysrhythmia    PAF;,sss s/p St[employer] Jude PPM  . Hyperlipidemia   . Hypertension   . MI (myocardial infarction) ([place]) 2014  . Presence of permanent cardiac pacemaker    Past Surgical History  Past Surgical History:  Procedure Laterality Date  . CHOLECYSTECTOMY    . CORONARY ANGIOPLASTY     mLAD '13, RCA DES 03/2013  . EXCISIONAL TOTAL KNEE ARTHROPLASTY WITH ANTIBIOTIC  SPACERS Right 04/20/2014   Procedure: IRRIGATION AND DEBRIDMENT RIGHT TOTAL KNEE REMOVE  ACL IMPLANTED AND PLACE SPACER;  Surgeon: [name], MD;  Location: [place];  Service: Orthopedics;  Laterality: Right;  . HEMORROIDECTOMY    . KNEE SURGERY  2006  . PACEMAKER INSERTION    . PICC LINE PLACE PERIPHERAL ([place] HX)  04/2014   R upper arm   . TOTAL KNEE ARTHROPLASTY WITH REVISION COMPONENTS Right 06/11/2014   Procedure: TOTAL KNEE ARTHROPLASTY WITH REVISION COMPONENTS REMOVE SPACER PLACE TKA;  Surgeon: [name], MD;  Location: [place];  Service: Orthopedics;  Laterality: Right;   Family History  Family History  Problem Relation Age of Onset  . Heart attack Mother   . Heart attack Father    Social History  reports that he quit smoking about 33 years ago. He has never used smokeless tobacco. He reports that he does not drink alcohol or use drugs. Allergies No Known Allergies Home medications Prior to Admission medications   Not on File   Liver Function Tests  Recent Labs Lab 06/27/16 0546  AST 14*  ALT 6*  ALKPHOS 52  BILITOT 1.1  PROT 5.8*  ALBUMIN 1.9*   No results for input(s): LIPASE,  AMYLASE in the last 168 hours. CBC  Recent Labs Lab 06/26/16 0409  06/27/16 1158 06/28/16 0338 06/29/16 0420  WBC 13.8*  < > 16.0* 17.1* 17.1*  NEUTROABS 8.4*  --   --  12.2*  --   HGB 8.6*  < > 7.7* 7.9* 7.6*  HCT 25.7*  < > 23.5* 23.7* 23.7*  MCV 84.5  < > 84.5 85.6 87.1  PLT 527*  < > 523* 556* 563*  < > = values in this interval not displayed. Basic Metabolic Panel  Recent Labs Lab 06/23/16 0331 06/24/16 0348 06/25/16 0435 06/26/16 0409 06/27/16 0546 06/28/16 0338 06/29/16 0420  NA 139 138 138 139 138 138 139  K 3.0* 3.6 3.4* 3.5 3.5 3.5 3.4*  CL 112* 110 110 108 107 108 107  CO2 21* 21* 22 22 24 23 24   GLUCOSE 119* 185* 140* 202* 224* 218* 162*  BUN 16 17 15 13 11 10 10   CREATININE 1.89* 1.69* 1.71* 1.80* 1.89* 2.04* 2.18*  CALCIUM 7.5* 7.8* 7.7* 7.9* 7.9* 7.9* 8.2*    Iron/TIBC/Ferritin/ %Sat    Component Value Date/Time   IRON 78 06/27/2016 0546   TIBC 161 (L) 06/27/2016 0546   FERRITIN 896 (H) 06/27/2016 0546   IRONPCTSAT 48 (H) 06/27/2016 0546    Vitals:   06/28/16 1329 06/28/16 2206 06/29/16 0630 06/29/16 1400  BP: 120/66 (!) 119/47 139/66 (!) 138/56  Pulse: 74 79 70 75  Resp: 18 18 20 20   Temp: 98.3 F (36.8 C) 98.4 F (36.9 C) 98.1 F (36.7 C) 98.2 F (36.8 C)  TempSrc: Oral Oral Oral Oral  SpO2: 98% 98% 99% 99%  Weight:      Height:       Exam Gen chron ill appearing, weak and tired No rash, cyanosis or gangrene Sclera anicteric, throat clear  No jvd or bruits Chest clear bilat RRR no MRG Abd soft ntnd no mass or ascites +bs GU normal male w condom cath draining clear yellowish urine MS R knee tender, mildly swollen Ext 2+ RLE edema diffusely, 1+ LLE edema, no UE edema / no wounds or ulcers Neuro is alert, Ox 3 ,gen'd weakness, poor movement of LE's  IP Antibiotics >  Diflucan = dc'd on 5/10 Nystatin powder dc'd on 5/9 Vanc = dc'd on 5/7 Nafcillin = dc'd on 5/7 Unasyn 5/7 > current  UA 5/12 > clear, no bact, glu 150 prot neg, rbc / wbc 0-5 Renal US May 13 >  - Right Kidney 10.3 cm.  8.5 cm cyst.  Mild cortical thinning - Left Kidney: Length: 10.1 cm. Echogenicity within normal limits. No mass or hydronephrosis visualized.  I/O since admission 5/2 > 23 L in and 16 L out (+7 L) Wts = 81 kg on admission 5/2 and 96kg on 5/8 (last wt)  Assessment: 1  Acute on CKD - baseline creat 1.3 in March.  Now up to 2.18 in setting of acutely infected R knee TKR. Looks vol overloaded somewhat but hasn't had wt's for several days.  MEds reviewed, no renal toxins.  BP's good.  Got IV vanc but was stopped a week ago, is on Unasyn now.  For now no new suggestions.  Get new weight today, consider more lasix if wt's are still ^^.  Will follow.   2  Septic R knee prosthesis - not surg candidate, on IV abx for enterococcus/ MSSA  bacteremia 3  Debility 4  Hx PPM 5  HTN bp's stable 6  Hx MI  Plan - will follow  Kelly Splinter MD [facility] pager [phone]   06/29/2016, 2:00 PM

## 2016-06-29 NOTE — Progress Notes (Signed)
Physical Therapy Treatment Patient Details Name: Randy [name] MRN: [id number] DOB: May 04, 1924 Today's Date: 06/29/2016    History of Present Illness Randy [name] is a 83 y.o. gentleman with a history of mild dementia/cognitive impairment, CAD with prior stent placement, history of atrial fibrillation S/P PPM implant  HTN, HLD, DM, and CKD ,infected right TKA(multiple surgeries.) was referred to the ED via EMS by one of his caregivers for evaluation of increased lethargy, mental status changes, and cough.  TPer historypatient is nonambulatory at baseline and spends most of his day sitting up a recliner. Aspiration of the  right knee performed.    PT Comments    Attempted OOB to recliner however pt was unable to achieve full upright sitting EOB.  Increased c/o R knee pain with activity. Present with severe posterior lean and poor hip flexion.  Pt nearly slid off air mattress bed. Returned to supine position.    Follow Up Recommendations        Equipment Recommendations       Recommendations for Other Services       Precautions / Restrictions Precautions Precautions: Fall Precaution Comments: Flexisealm incontince.   Condom cath Restrictions Weight Bearing Restrictions: No    Mobility  Bed Mobility Overal bed mobility: Needs Assistance Bed Mobility: Supine to Sit;Sit to Supine     Supine to sit: Max assist;+2 for physical assistance;+2 for safety/equipment Sit to supine: Total assist;+2 for physical assistance;+2 for safety/equipment   General bed mobility comments: + 2 assist and use of bed pad to complete scooting.  Pt unable to achieve full upright sitting position.  Severe posreior lean and hips thrusting forward nearly falling/sliding off air mattress.  Increased c/o knee pain, pt did not feel able to attempt transfer.  Returned to supine.    Transfers                    Ambulation/Gait                 Stairs            Wheelchair Mobility     Modified Rankin (Stroke Patients Only)       Balance                                            Cognition Arousal/Alertness: Awake/alert Behavior During Therapy: WFL for tasks assessed/performed Overall Cognitive Status: Within Functional Limits for tasks assessed                                        Exercises      General Comments        Pertinent Vitals/Pain Pain Assessment: Faces Faces Pain Scale: Hurts little more Pain Location: R knee Pain Descriptors / Indicators: Burning;Discomfort;Grimacing Pain Intervention(s): Monitored during session;Repositioned    Home Living                      Prior Function            PT Goals (current goals can now be found in the care plan section) Progress towards PT goals: Progressing toward goals    Frequency           PT Plan      Co-evaluation  AM-PAC PT "6 Clicks" Daily Activity  Outcome Measure                   End of Session               Time: 3735-7897 PT Time Calculation (min) (ACUTE ONLY): 27 min  Charges:  $Gait Training: 8-22 mins $Therapeutic Activity: 8-22 mins                    G Codes:       Rica Koyanagi  PTA [facility]  Acute  Rehab Pager      [phone]

## 2016-06-29 NOTE — Care Management Note (Signed)
Case Management Note  Patient Details  Name: JOSIP MEROLLA MRN: [id number] Date of Birth: 1924/06/14  Subjective/Objective: Spoke to Kandace Parkins HCPOA [phone]-He is in agreement to SNF-CSW notified.                   Action/Plan:d/c SNF.   Expected Discharge Date:                  Expected Discharge Plan:  Skilled Nursing Facility  In-House Referral:  Clinical Social Work  Discharge planning Services  CM Consult  Post Acute Care Choice:    Choice offered to:     DME Arranged:    DME Agency:     HH Arranged:    [place] Agency:     Status of Service:  In process, will continue to follow  If discussed at Long Length of Stay Meetings, dates discussed:    Additional Comments:  Dessa Phi, RN 06/29/2016, 12:40 PM

## 2016-06-29 NOTE — Progress Notes (Signed)
Subjective:  Asked to see the patient due to him continuing to have leukocytosis    Antibiotics:  Anti-infectives    Start     Dose/Rate Route Frequency Ordered Stop   06/29/16 1800  Ampicillin-Sulbactam (UNASYN) 3 g in sodium chloride 0.9 % 100 mL IVPB     3 g 200 mL/hr over 30 Minutes Intravenous Every 12 hours 06/29/16 0812     06/22/16 1000  Ampicillin-Sulbactam (UNASYN) 3 g in sodium chloride 0.9 % 100 mL IVPB  Status:  Discontinued     3 g 200 mL/hr over 30 Minutes Intravenous Every 6 hours 06/22/16 0839 06/29/16 0812   06/19/16 2100  nafcillin 2 g in dextrose 5 % 100 mL IVPB  Status:  Discontinued     2 g 200 mL/hr over 30 Minutes Intravenous Every 4 hours 06/18/16 2032 06/22/16 0837   06/18/16 2200  vancomycin (VANCOCIN) IVPB 1000 mg/200 mL premix  Status:  Discontinued     1,000 mg 200 mL/hr over 60 Minutes Intravenous Every 24 hours 06/18/16 1702 06/22/16 0837   06/18/16 2100  vancomycin (VANCOCIN) IVPB 750 mg/150 ml premix  Status:  Discontinued     750 mg 150 mL/hr over 60 Minutes Intravenous Every 24 hours 06/17/16 2103 06/17/16 2103   06/18/16 2100  vancomycin (VANCOCIN) IVPB 1000 mg/200 mL premix  Status:  Discontinued     1,000 mg 200 mL/hr over 60 Minutes Intravenous Every 24 hours 06/17/16 2103 06/18/16 1638   06/18/16 2100  nafcillin IVPB 2 g  Status:  Discontinued     2 g 200 mL/hr over 30 Minutes Intravenous Every 4 hours 06/18/16 2025 06/18/16 2026   06/18/16 2100  nafcillin 2 g in dextrose 5 % 50 mL IVPB     2 g 100 mL/hr over 30 Minutes Intravenous Every 4 hours 06/18/16 2027 06/19/16 1741   06/18/16 1700  nafcillin injection 2 g  Status:  Discontinued     2 g Intravenous Every 4 hours 06/18/16 1641 06/18/16 1650   06/18/16 1700  nafcillin 2 g in dextrose 5 % 100 mL IVPB  Status:  Discontinued     2 g 200 mL/hr over 30 Minutes Intravenous Every 4 hours 06/18/16 1650 06/18/16 2032   06/18/16 1600  fluconazole (DIFLUCAN) tablet 100 mg  Status:   Discontinued     100 mg Oral Daily 06/18/16 1516 06/25/16 1559   06/18/16 0400  piperacillin-tazobactam (ZOSYN) IVPB 3.375 g  Status:  Discontinued     3.375 g 12.5 mL/hr over 240 Minutes Intravenous Every 8 hours 06/17/16 2103 06/18/16 1556   06/17/16 2045  piperacillin-tazobactam (ZOSYN) IVPB 3.375 g     3.375 g 100 mL/hr over 30 Minutes Intravenous  Once 06/17/16 2032 06/17/16 2152   06/17/16 2045  vancomycin (VANCOCIN) IVPB 1000 mg/200 mL premix     1,000 mg 200 mL/hr over 60 Minutes Intravenous  Once 06/17/16 2032 06/17/16 2334      Medications: Scheduled Meds: . apixaban  10 mg Oral BID   Followed by  . [START ON 07/04/2016] apixaban  5 mg Oral BID  . chlorhexidine  15 mL Mouth Rinse BID  . Chlorhexidine Gluconate Cloth  6 each Topical Q0600  . feeding supplement  1 Container Oral BID BM  . Gerhardt's butt cream   Topical TID  . insulin aspart  0-20 Units Subcutaneous TID WC  . insulin aspart  0-5 Units Subcutaneous QHS  . insulin aspart  10  Units Subcutaneous TID WC  . insulin detemir  10 Units Subcutaneous BID  . mouth rinse  15 mL Mouth Rinse q12n4p  . mupirocin ointment  1 application Nasal BID  . pantoprazole  40 mg Oral BID AC  . sodium chloride flush  10-40 mL Intracatheter Q12H   Continuous Infusions: . sodium chloride 10 mL/hr at 06/28/16 2300  . ampicillin-sulbactam (UNASYN) IV     PRN Meds:.hydrALAZINE, sodium chloride flush    Objective: Weight change:   Intake/Output Summary (Last 24 hours) at 06/29/16 1747 Last data filed at 06/29/16 1500  Gross per 24 hour  Intake           680.33 ml  Output             1000 ml  Net          -319.67 ml   Blood pressure (!) 138/56, pulse 75, temperature 98.2 F (36.8 C), temperature source Oral, resp. rate 20, height 5\' 11"  (1.803 m), weight 212 lb 4.9 oz (96.3 kg), SpO2 99 %. Temp:  [98.1 F (36.7 C)-98.4 F (36.9 C)] 98.2 F (36.8 C) (05/14 1400) Pulse Rate:  [70-79] 75 (05/14 1400) Resp:  [18-20] 20  (05/14 1400) BP: (119-139)/(47-66) 138/56 (05/14 1400) SpO2:  [98 %-99 %] 99 % (05/14 1400)  Physical Exam: General: Alert and awake oriented to person, l HEENT: anicteric sclera, pupils reactive to light and accommodation, EOMI CVS regular rate, normal r,  no murmur rubs or gallops Chest: clear to auscultation bilaterally, no wheezing, rales or rhonchi Abdomen: soft nontender, edematous skin Extremities Right knee tender  Neuro: nonfocal  CBC:  CBC Latest Ref Rng & Units 06/29/2016 06/28/2016 06/27/2016  WBC 4.0 - 10.5 K/uL 17.1(H) 17.1(H) 16.0(H)  Hemoglobin 13.0 - 17.0 g/dL 7.6(L) 7.9(L) 7.7(L)  Hematocrit 39.0 - 52.0 % 23.7(L) 23.7(L) 23.5(L)  Platelets 150 - 400 K/uL 563(H) 556(H) 523(H)      BMET  Recent Labs  06/28/16 0338 06/29/16 0420  NA 138 139  K 3.5 3.4*  CL 108 107  CO2 23 24  GLUCOSE 218* 162*  BUN 10 10  CREATININE 2.04* 2.18*  CALCIUM 7.9* 8.2*     Liver Panel   Recent Labs  06/27/16 0546  PROT 5.8*  ALBUMIN 1.9*  AST 14*  ALT 6*  ALKPHOS 52  BILITOT 1.1       Sedimentation Rate  Recent Labs  06/29/16 0420  ESRSEDRATE 35*   C-Reactive Protein  Recent Labs  06/29/16 0420  CRP 4.0*    Micro Results: Recent Results (from the past 720 hour(s))  Blood Culture (routine x 2)     Status: Abnormal   Collection Time: 06/17/16  7:52 PM  Result Value Ref Range Status   Specimen Description BLOOD BLOOD LEFT FOREARM  Final   Special Requests IN PEDIATRIC BOTTLE Blood Culture adequate volume  Final   Culture  Setup Time   Final    GRAM POSITIVE COCCI IN CLUSTERS IN PEDIATRIC BOTTLE CRITICAL VALUE NOTED.  VALUE IS CONSISTENT WITH PREVIOUSLY REPORTED AND CALLED VALUE.    Culture (A)  Final    STAPHYLOCOCCUS SPECIES (COAGULASE NEGATIVE) THE SIGNIFICANCE OF ISOLATING THIS ORGANISM FROM A SINGLE SET OF BLOOD CULTURES WHEN MULTIPLE SETS ARE DRAWN IS UNCERTAIN. PLEASE NOTIFY THE MICROBIOLOGY DEPARTMENT WITHIN ONE WEEK IF SPECIATION AND  SENSITIVITIES ARE REQUIRED. Performed at [facility], [place] [address] [place], [place] [address]    Report Status 06/19/2016 FINAL  Final  Blood  Culture (routine x 2)     Status: Abnormal   Collection Time: 06/17/16  8:12 PM  Result Value Ref Range Status   Specimen Description BLOOD RIGHT ANTECUBITAL  Final   Special Requests   Final    BOTTLES DRAWN AEROBIC AND ANAEROBIC Blood Culture results may not be optimal due to an inadequate volume of blood received in culture bottles   Culture  Setup Time   Final    GRAM POSITIVE COCCI IN CHAINS IN BOTH AEROBIC AND ANAEROBIC BOTTLES CRITICAL RESULT CALLED TO, READ BACK BY AND VERIFIED WITH: N. Glogovac Pharm.D. 15:45 06/18/16 ([email or web address]) Performed at [facility], [place] [address], [place], [place] [address]    Culture STAPHYLOCOCCUS AUREUS ENTEROCOCCUS FAECALIS  (A)  Final   Report Status 06/21/2016 FINAL  Final   Organism ID, Bacteria STAPHYLOCOCCUS AUREUS  Final   Organism ID, Bacteria ENTEROCOCCUS FAECALIS  Final      Susceptibility   Enterococcus faecalis - MIC*    AMPICILLIN <=2 SENSITIVE Sensitive     VANCOMYCIN <=0.5 SENSITIVE Sensitive     GENTAMICIN SYNERGY SENSITIVE Sensitive     * ENTEROCOCCUS FAECALIS   Staphylococcus aureus - MIC*    CIPROFLOXACIN <=0.5 SENSITIVE Sensitive     ERYTHROMYCIN <=0.25 SENSITIVE Sensitive     GENTAMICIN <=0.5 SENSITIVE Sensitive     OXACILLIN <=0.25 SENSITIVE Sensitive     TETRACYCLINE <=1 SENSITIVE Sensitive     VANCOMYCIN <=0.5 SENSITIVE Sensitive     TRIMETH/SULFA <=10 SENSITIVE Sensitive     CLINDAMYCIN <=0.25 SENSITIVE Sensitive     RIFAMPIN <=0.5 SENSITIVE Sensitive     Inducible Clindamycin NEGATIVE Sensitive     * STAPHYLOCOCCUS AUREUS  Blood Culture ID Panel (Reflexed)     Status: Abnormal   Collection Time: 06/17/16  8:12 PM  Result Value Ref Range Status   Enterococcus species DETECTED (A) NOT DETECTED Final    Comment: CRITICAL RESULT CALLED TO, READ BACK BY AND  VERIFIED WITH: N. Glogovac Pharm.D. 15:45 06/18/16 ([email or web address])    Vancomycin resistance NOT DETECTED NOT DETECTED Final   Listeria monocytogenes NOT DETECTED NOT DETECTED Final   Staphylococcus species DETECTED (A) NOT DETECTED Final    Comment: CRITICAL RESULT CALLED TO, READ BACK BY AND VERIFIED WITH: N. Glogovac Pharm.D. 15:45 06/18/16 (wilsonm)    Staphylococcus aureus DETECTED (A) NOT DETECTED Final    Comment: Methicillin (oxacillin) susceptible Staphylococcus aureus (MSSA). Preferred therapy is anti staphylococcal beta lactam antibiotic (Cefazolin or Nafcillin), unless clinically contraindicated. CRITICAL RESULT CALLED TO, READ BACK BY AND VERIFIED WITH: N. Glogovac Pharm.D. 15:45 06/18/16 (wilsonm)    Methicillin resistance NOT DETECTED NOT DETECTED Final   Streptococcus species NOT DETECTED NOT DETECTED Final   Streptococcus agalactiae NOT DETECTED NOT DETECTED Final   Streptococcus pneumoniae NOT DETECTED NOT DETECTED Final   Streptococcus pyogenes NOT DETECTED NOT DETECTED Final   Acinetobacter baumannii NOT DETECTED NOT DETECTED Final   Enterobacteriaceae species NOT DETECTED NOT DETECTED Final   Enterobacter cloacae complex NOT DETECTED NOT DETECTED Final   Escherichia coli NOT DETECTED NOT DETECTED Final   Klebsiella oxytoca NOT DETECTED NOT DETECTED Final   Klebsiella pneumoniae NOT DETECTED NOT DETECTED Final   Proteus species NOT DETECTED NOT DETECTED Final   Serratia marcescens NOT DETECTED NOT DETECTED Final   Haemophilus influenzae NOT DETECTED NOT DETECTED Final   Neisseria meningitidis NOT DETECTED NOT DETECTED Final   Pseudomonas aeruginosa NOT DETECTED NOT DETECTED Final   Candida albicans NOT DETECTED NOT DETECTED Final  Candida glabrata NOT DETECTED NOT DETECTED Final   Candida krusei NOT DETECTED NOT DETECTED Final   Candida parapsilosis NOT DETECTED NOT DETECTED Final   Candida tropicalis NOT DETECTED NOT DETECTED Final    Comment: Performed at [facility], [place] [address] [place], [place] [address]  MRSA PCR Screening     Status: Abnormal   Collection Time: 06/18/16  1:40 AM  Result Value Ref Range Status   MRSA by PCR POSITIVE (A) NEGATIVE Final    Comment:        The GeneXpert MRSA Assay (FDA approved for NASAL specimens only), is one component of a comprehensive MRSA colonization surveillance program. It is not intended to diagnose MRSA infection nor to guide or monitor treatment for MRSA infections. RESULT CALLED TO, READ BACK BY AND VERIFIED WITH: C.SMITH,RN [id number] 06/18/16 W.SHEA   Culture, respiratory (NON-Expectorated)     Status: None   Collection Time: 06/18/16  6:32 AM  Result Value Ref Range Status   Specimen Description TRACHEAL ASPIRATE  Final   Special Requests NONE  Final   Gram Stain   Final    ABUNDANT WBC PRESENT, PREDOMINANTLY PMN RARE SQUAMOUS EPITHELIAL CELLS PRESENT MODERATE GRAM POSITIVE RODS GRAM POSITIVE COCCI IN PAIRS    Culture   Final    Consistent with normal respiratory flora. Performed at [facility], [place] [address], [place], [place] [address]    Report Status 06/20/2016 FINAL  Final  Culture, blood (Routine X 2) w Reflex to ID Panel     Status: None   Collection Time: 06/18/16  6:16 PM  Result Value Ref Range Status   Specimen Description BLOOD RIGHT ARM  Final   Special Requests IN PEDIATRIC BOTTLE Blood Culture adequate volume  Final   Culture   Final    NO GROWTH 5 DAYS Performed at [facility], [place] [address], [place], [place] [address]    Report Status 06/23/2016 FINAL  Final  Culture, blood (Routine X 2) w Reflex to ID Panel     Status: Abnormal   Collection Time: 06/18/16  6:22 PM  Result Value Ref Range Status   Specimen Description BLOOD RIGHT ANTECUBITAL  Final   Special Requests IN PEDIATRIC BOTTLE Blood Culture adequate volume  Final   Culture  Setup Time   Final    GRAM POSITIVE COCCI IN CLUSTERS IN PEDIATRIC BOTTLE CRITICAL RESULT CALLED TO, READ BACK  BY AND VERIFIED WITH: D ZEIGLER PHARMD 1932 06/19/16 A BROWNING    Culture (A)  Final    STAPHYLOCOCCUS AUREUS SUSCEPTIBILITIES PERFORMED ON PREVIOUS CULTURE WITHIN THE LAST 5 DAYS. Performed at [facility], [place] [address] [place], [place] [address]    Report Status 06/21/2016 FINAL  Final  Blood Culture ID Panel (Reflexed)     Status: Abnormal   Collection Time: 06/18/16  6:22 PM  Result Value Ref Range Status   Enterococcus species NOT DETECTED NOT DETECTED Final   Listeria monocytogenes NOT DETECTED NOT DETECTED Final   Staphylococcus species DETECTED (A) NOT DETECTED Final    Comment: CRITICAL RESULT CALLED TO, READ BACK BY AND VERIFIED WITHMarcy Siren PHARMD 1932 06/19/16 A BROWNING    Staphylococcus aureus DETECTED (A) NOT DETECTED Final    Comment: Methicillin (oxacillin) susceptible Staphylococcus aureus (MSSA). Preferred therapy is anti staphylococcal beta lactam antibiotic (Cefazolin or Nafcillin), unless clinically contraindicated. CRITICAL RESULT CALLED TO, READ BACK BY AND VERIFIED WITHMarcy Siren PHARMD 1932 06/19/16 A BROWNING  Methicillin resistance NOT DETECTED NOT DETECTED Final   Streptococcus species NOT DETECTED NOT DETECTED Final   Streptococcus agalactiae NOT DETECTED NOT DETECTED Final   Streptococcus pneumoniae NOT DETECTED NOT DETECTED Final   Streptococcus pyogenes NOT DETECTED NOT DETECTED Final   Acinetobacter baumannii NOT DETECTED NOT DETECTED Final   Enterobacteriaceae species NOT DETECTED NOT DETECTED Final   Enterobacter cloacae complex NOT DETECTED NOT DETECTED Final   Escherichia coli NOT DETECTED NOT DETECTED Final   Klebsiella oxytoca NOT DETECTED NOT DETECTED Final   Klebsiella pneumoniae NOT DETECTED NOT DETECTED Final   Proteus species NOT DETECTED NOT DETECTED Final   Serratia marcescens NOT DETECTED NOT DETECTED Final   Haemophilus influenzae NOT DETECTED NOT DETECTED Final   Neisseria meningitidis NOT DETECTED NOT DETECTED Final    Pseudomonas aeruginosa NOT DETECTED NOT DETECTED Final   Candida albicans NOT DETECTED NOT DETECTED Final   Candida glabrata NOT DETECTED NOT DETECTED Final   Candida krusei NOT DETECTED NOT DETECTED Final   Candida parapsilosis NOT DETECTED NOT DETECTED Final   Candida tropicalis NOT DETECTED NOT DETECTED Final    Comment: Performed at [facility], [place] [address] [place], [place] [address]  Culture, blood (Routine X 2) w Reflex to ID Panel     Status: None   Collection Time: 06/19/16 11:08 AM  Result Value Ref Range Status   Specimen Description BLOOD RIGHT ARM  Final   Special Requests IN PEDIATRIC BOTTLE Blood Culture adequate volume  Final   Culture   Final    NO GROWTH 5 DAYS Performed at [facility], [place] [address], [place], [place] [address]    Report Status 06/24/2016 FINAL  Final  Culture, blood (Routine X 2) w Reflex to ID Panel     Status: None   Collection Time: 06/19/16 11:08 AM  Result Value Ref Range Status   Specimen Description BLOOD RIGHT HAND  Final   Special Requests IN PEDIATRIC BOTTLE Blood Culture adequate volume  Final   Culture   Final    NO GROWTH 5 DAYS Performed at [facility], [place] [address], [place], [place] [address]    Report Status 06/24/2016 FINAL  Final  Body fluid culture     Status: None   Collection Time: 06/19/16 12:25 PM  Result Value Ref Range Status   Specimen Description SYNOVIAL RIGHT KNEE  Final   Special Requests NONE  Final   Gram Stain   Final    MODERATE WBC PRESENT,BOTH PMN AND MONONUCLEAR FEW GRAM POSITIVE COCCI IN PAIRS    Culture   Final    MODERATE STAPHYLOCOCCUS AUREUS NO ANAEROBES ISOLATED Performed at [facility], [place] [address], [place], [place] [address]    Report Status 06/22/2016 FINAL  Final   Organism ID, Bacteria STAPHYLOCOCCUS AUREUS  Final      Susceptibility   Staphylococcus aureus - MIC*    CIPROFLOXACIN <=0.5 SENSITIVE Sensitive     ERYTHROMYCIN <=0.25 SENSITIVE Sensitive       GENTAMICIN <=0.5 SENSITIVE Sensitive     OXACILLIN <=0.25 SENSITIVE Sensitive     TETRACYCLINE <=1 SENSITIVE Sensitive     VANCOMYCIN <=0.5 SENSITIVE Sensitive     TRIMETH/SULFA <=10 SENSITIVE Sensitive     CLINDAMYCIN <=0.25 SENSITIVE Sensitive     RIFAMPIN <=0.5 SENSITIVE Sensitive     Inducible Clindamycin NEGATIVE Sensitive     * MODERATE STAPHYLOCOCCUS AUREUS  C difficile quick scan w PCR reflex  Status: None   Collection Time: 06/26/16 11:47 AM  Result Value Ref Range Status   C Diff antigen NEGATIVE NEGATIVE Final   C Diff toxin NEGATIVE NEGATIVE Final   C Diff interpretation No C. difficile detected.  Final  Culture, Urine     Status: None   Collection Time: 06/27/16  3:41 PM  Result Value Ref Range Status   Specimen Description URINE, CLEAN CATCH  Final   Special Requests NONE  Final   Culture   Final    NO GROWTH Performed at [facility], [address] [place], [place] [address]    Report Status 06/28/2016 FINAL  Final  Culture, blood (Routine X 2) w Reflex to ID Panel     Status: None (Preliminary result)   Collection Time: 06/27/16  5:10 PM  Result Value Ref Range Status   Specimen Description BLOOD LEFT HAND  Final   Special Requests   Final    BOTTLES DRAWN AEROBIC AND ANAEROBIC Blood Culture adequate volume   Culture   Final    NO GROWTH 1 DAY Performed at [facility], [place] [address] [place], [place] [address]    Report Status PENDING  Incomplete  Culture, blood (Routine X 2) w Reflex to ID Panel     Status: None (Preliminary result)   Collection Time: 06/27/16  6:46 PM  Result Value Ref Range Status   Specimen Description BLOOD LEFT FOREARM  Final   Special Requests IN PEDIATRIC BOTTLE Blood Culture adequate volume  Final   Culture   Final    NO GROWTH 1 DAY Performed at [facility], [place] [address] [place], [place] [address]    Report Status PENDING  Incomplete    Studies/Results: US Renal  Result Date:  06/28/2016 CLINICAL DATA:  Renal failure. EXAM: RENAL / URINARY TRACT ULTRASOUND COMPLETE COMPARISON:  CT scan Jun 26, 2016 FINDINGS: Right Kidney: Length: 10.3 cm.  8.5 cm cyst.  Mild cortical thinning. Left Kidney: Length: 10.1 cm. Echogenicity within normal limits. No mass or hydronephrosis visualized. Bladder: Appears normal for degree of bladder distention. IMPRESSION: Right renal cyst.  No acute abnormality. Electronically Signed   By: Dorise Bullion III M.D   On: 06/28/2016 16:25      Assessment/Plan:  INTERVAL HISTORY:   Still with leukocytosis  Principal Problem:   Sepsis ([place]) Active Problems:   Hypertension   Diabetes mellitus ([place])   Chronic kidney disease (CKD), stage III (moderate)   Cough   Leukocytosis   Pacemaker   Pressure injury of skin   Staphylococcus aureus bacteremia with sepsis ([place])   Pacemaker infection ([place])   Enterococcal bacteremia   Late onset Alzheimer's disease without behavioral disturbance   Leg DVT (deep venous thromboembolism), acute, bilateral ([name])   Acute renal failure superimposed on stage 3 chronic kidney disease ([place])   Acute blood loss anemia   Uncontrolled type 2 diabetes mellitus with hyperglycemia, without long-term current use of insulin ([place])   Acute on chronic renal failure ([name])    Randy [name] is a 83 y.o. male with  Pacemaker + MSSA and enterococcal bacteremia = infected pacemaker +/- endocarditis, septic PJI of right knee. He cannot have TEE, he cannot have PM removed or have AKA still with leukocytosis  #1 Leukocytosis:  Patient has known septic knee but is not a candidate for surgery  He also has by definition an infected PM and potentially endocarditis  These are 2 reasons alone for  persistent elevated WBC in context of 2 sites of infection in blood and bone that are NOT going to be controllable and which we are only hoping to suppress  I agree with Orthopedic surgery that palliative care consult could be of  benefit    #2  MSSA and enterococcal bacteremia with infected PM and PJI  If aggressive care  Continues to be pursued then the plan has been:  -continue UNASYN and coplete 6 week course followed by indefinite oral augmentin   Diagnosis: MSSA and AMP S enterococcal bacteremia PM infection and PJI  Culture Result: MSSA and AMP S enterococus  No Known Allergies  Discharge antibiotics: IV unasyn  Duration: 6 weeks End Date:  June 15th  [place] Per Protocol:  Labs weekly while on IV antibiotics: _x_ CBC with differential _x_ BMP   _x_ Please pull PIC at completion of IV antibiotics __ Please leave PIC in place until doctor has seen patient or been notified  WHEN IV ANTIBIOTICS HE NEEDS TO IMMEDIATELY START ORAL AUGMENTIN 875/125 BID FOR THE REST OF HIS LIFE    Fax weekly labs to [phone]  Clinic Follow Up Appt:  Next 4 weeks with [place] in ID clinic  I will again sign off  Please call with further questions.         LOS: 12 days   Alcide Evener 06/29/2016, 5:47 PM

## 2016-06-29 NOTE — NC FL2 (Signed)
[place] LEVEL OF CARE SCREENING TOOL     IDENTIFICATION  Patient Name: Randy [name] Birthdate: [birth year] Sex: male Admission Date (Current Location): 06/17/2016  [facility] and [place] Number:  Herbalist and Address:  [facility],  [place] [address] [place]      Provider Number: [id number]  Attending Physician Name and Address:  Orson Eva, MD  Relative Name and Phone Number:       Current Level of Care: Hospital Recommended Level of Care: [place] Prior Approval Number:    Date Approved/Denied:   PASRR Number: [id number]  Discharge Plan: SNF    Current Diagnoses: Patient Active Problem List   Diagnosis Date Noted  . Acute on chronic renal failure ([place])   . Uncontrolled type 2 diabetes mellitus with hyperglycemia, without long-term current use of insulin ([place]) 06/27/2016  . Acute blood loss anemia   . Leg DVT (deep venous thromboembolism), acute, bilateral ([place]) 06/24/2016  . Acute renal failure superimposed on stage 3 chronic kidney disease ([place]) 06/24/2016  . Staphylococcus aureus bacteremia with sepsis ([place])   . Pacemaker infection ([place])   . Enterococcal bacteremia   . Late onset Alzheimer's disease without behavioral disturbance   . Pressure injury of skin 06/18/2016  . Cough 06/17/2016  . Leukocytosis 06/17/2016  . Pacemaker 06/17/2016  . 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 History of infection due to ESBL Escherichia coli   . Chronic kidney disease (CKD), stage III (moderate) 04/20/2014  . Prosthetic joint infection ([place]) 04/20/2014  . Septic joint of right knee joint ([place]) 04/19/2014  . Pyelonephritis 03/11/2014  . Sepsis ([place]) 01/04/2014  . Diabetes mellitus without complication ([place])   . Diabetes mellitus ([place]) 12/21/2013  . Acute UTI 12/20/2013  .  History of MI (myocardial infarction) 12/20/2013  . UTI (lower urinary tract infection) 12/20/2013  . Elevated brain natriuretic peptide (BNP) level 12/20/2013  . Hyperglycemia 12/20/2013  . Acute kidney injury ([place]) 12/20/2013  . Decubitus ulcer 12/20/2013  . History of DVT (deep vein thrombosis) 12/20/2013  . Hyperlipidemia   . Hypertension   . Anxiety   . Essential hypertension   . Weakness generalized     Orientation RESPIRATION BLADDER Height & Weight     Self  Normal Incontinent Weight: 212 lb 4.9 oz (96.3 kg) Height:  5\' 11"  (180.3 cm)  BEHAVIORAL SYMPTOMS/MOOD NEUROLOGICAL BOWEL NUTRITION STATUS      Incontinent Diet (dys 1 diet fluid consistency thin)  AMBULATORY STATUS COMMUNICATION OF NEEDS Skin   Extensive Assist Verbally                         Personal Care Assistance Level of Assistance  Bathing, Feeding, Dressing Bathing Assistance: Maximum assistance Feeding assistance: Limited assistance Dressing Assistance: Maximum assistance     Functional Limitations Info             SPECIAL CARE FACTORS FREQUENCY  PT (By licensed PT), OT (By licensed OT)     PT Frequency: 5 days OT Frequency: 5 days            Contractures      Additional Factors Info  Code Status, Allergies Code Status Info: full code Allergies Info: no known allergies           Current Medications (06/29/2016):  This is  the current hospital active medication list Current Facility-Administered Medications  Medication Dose Route Frequency Provider Last Rate Last Dose  . 0.9 %  sodium chloride infusion   Intravenous Continuous Cristal Ford, DO 10 mL/hr at 06/28/16 2300    . Ampicillin-Sulbactam (UNASYN) 3 g in sodium chloride 0.9 % 100 mL IVPB  3 g Intravenous Q12H Berton Mount, RPH      . apixaban (ELIQUIS) tablet 10 mg  10 mg Oral BID Orson Eva, MD   10 mg at 06/29/16 4917   Followed by  . [START ON 07/04/2016] apixaban (ELIQUIS) tablet 5 mg  5 mg Oral BID Tat,  David, MD      . chlorhexidine (PERIDEX) 0.12 % solution 15 mL  15 mL Mouth Rinse BID Lily Kocher, MD   15 mL at 06/29/16 0925  . Chlorhexidine Gluconate Cloth 2 % PADS 6 each  6 each Topical Q0600 Tat, Shanon Brow, MD   6 each at 06/29/16 [phone]  . feeding supplement (BOOST / RESOURCE BREEZE) liquid 1 Container  1 Container Oral BID BM Mikhail, [place], DO   1 Container at 06/29/16 1400  . Gerhardt's butt cream   Topical TID Tat, Shanon Brow, MD      . hydrALAZINE (APRESOLINE) injection 10 mg  10 mg Intravenous Q6H PRN Mikhail, Velta Addison, DO      . insulin aspart (novoLOG) injection 0-20 Units  0-20 Units Subcutaneous TID WC Lily Kocher, MD   7 Units at 06/29/16 1437  . insulin aspart (novoLOG) injection 0-5 Units  0-5 Units Subcutaneous QHS Lily Kocher, MD   2 Units at 06/27/16 2147  . insulin aspart (novoLOG) injection 10 Units  10 Units Subcutaneous TID WC Orson Eva, MD   10 Units at 06/29/16 1437  . insulin detemir (LEVEMIR) injection 10 Units  10 Units Subcutaneous BID Orson Eva, MD   10 Units at 06/29/16 [phone]  . MEDLINE mouth rinse  15 mL Mouth Rinse q12n4p Lily Kocher, MD   15 mL at 06/28/16 1505  . mupirocin ointment (BACTROBAN) 2 % 1 application  1 application Nasal BID Orson Eva, MD   1 application at 94/80/16 [phone]  . pantoprazole (PROTONIX) EC tablet 40 mg  40 mg Oral BID Mina Marble, MD   40 mg at 06/29/16 0924  . sodium chloride flush (NS) 0.9 % injection 10-40 mL  10-40 mL Intracatheter Q12H Mikhail, [place], DO   10 mL at 06/26/16 4827  . sodium chloride flush (NS) 0.9 % injection 10-40 mL  10-40 mL Intracatheter PRN Cristal Ford, DO   10 mL at 06/27/16 1204     Discharge Medications: Please see discharge summary for a list of discharge medications.  Relevant Imaging Results:  Relevant Lab Results:   Additional Information [id number]  Carlean Jews, LCSW

## 2016-06-30 LAB — BASIC METABOLIC PANEL
ANION GAP: 8 (ref 5–15)
BUN: 11 mg/dL (ref 6–20)
CHLORIDE: 107 mmol/L (ref 101–111)
CO2: 24 mmol/L (ref 22–32)
CREATININE: 2.2 mg/dL — AB (ref 0.61–1.24)
Calcium: 8.3 mg/dL — ABNORMAL LOW (ref 8.9–10.3)
GFR calc non Af Amer: 25 mL/min — ABNORMAL LOW (ref >60)
GFR, EST AFRICAN AMERICAN: 29 mL/min — AB (ref >60)
Glucose, Bld: 121 mg/dL — ABNORMAL HIGH (ref 65–99)
Potassium: 3.4 mmol/L — ABNORMAL LOW (ref 3.5–5.1)
SODIUM: 139 mmol/L (ref 135–145)

## 2016-06-30 LAB — CBC
HCT: 22.9 % — ABNORMAL LOW (ref 39.0–52.0)
HEMOGLOBIN: 7.5 g/dL — AB (ref 13.0–17.0)
MCH: 28.7 pg (ref 26.0–34.0)
MCHC: 32.8 g/dL (ref 30.0–36.0)
MCV: 87.7 fL (ref 78.0–100.0)
PLATELETS: 575 10*3/uL — AB (ref 150–400)
RBC: 2.61 MIL/uL — AB (ref 4.22–5.81)
RDW: 20.4 % — ABNORMAL HIGH (ref 11.5–15.5)
WBC: 16.5 10*3/uL — AB (ref 4.0–10.5)

## 2016-06-30 LAB — GLUCOSE, CAPILLARY
GLUCOSE-CAPILLARY: 113 mg/dL — AB (ref 65–99)
GLUCOSE-CAPILLARY: 201 mg/dL — AB (ref 65–99)
GLUCOSE-CAPILLARY: 186 mg/dL — AB (ref 65–99)
Glucose-Capillary: 194 mg/dL — ABNORMAL HIGH (ref 65–99)

## 2016-06-30 MED ORDER — INSULIN ASPART 100 UNIT/ML ~~LOC~~ SOLN
10.0000 [IU] | Freq: Three times a day (TID) | SUBCUTANEOUS | Status: DC
  Administered 2016-06-30 – 2016-07-06 (×17): 10 [IU] via SUBCUTANEOUS

## 2016-06-30 MED ORDER — FUROSEMIDE 10 MG/ML IJ SOLN
40.0000 mg | Freq: Two times a day (BID) | INTRAMUSCULAR | Status: DC
  Administered 2016-06-30 – 2016-07-02 (×5): 40 mg via INTRAVENOUS
  Filled 2016-06-30 (×5): qty 4

## 2016-06-30 MED ORDER — POTASSIUM CHLORIDE CRYS ER 20 MEQ PO TBCR
40.0000 meq | EXTENDED_RELEASE_TABLET | Freq: Every day | ORAL | Status: DC
  Administered 2016-06-30 – 2016-07-06 (×7): 40 meq via ORAL
  Filled 2016-06-30 (×7): qty 2

## 2016-06-30 NOTE — Progress Notes (Addendum)
PROGRESS NOTE  Randy [name] PQZ:[id number] DOB: 1924-02-25 DOA: 06/17/2016 PCP: System, Pcp Not In  Brief History: 83 y.o.gentleman with a history of mild dementia/cognitive impairment, CAD with prior stent placement, history of atrial fibrillation S/P PPM implant (CHADS-Vasc score of at least four, no anticoagulation), HTN, HLD, DM, and CKD 3 with baseline creatinine around 1.3-1.5 who was referred to the ED via EMS by one of his caregivers for evaluation of increased lethargy, mental status changes, and cough. The patient is nonambulatory at baseline and spends most of his day sitting up a recliner. He has caregiver support 24 hours/day. He has had cough for 4-5 days. He has had two known sick contacts (son-in-law and caregiver). He has had yellow sputum. No chest pain or shortness of breath. No nausea or vomiting/diarrhea. No abdominal pain. No dysuria/hematuria On 06/17/16, he was noted to have increased lethargy and muffled speech with slurring. No other focal weakenss. 911 was called and the patient was transferred to the ED for evaluation. Further workup revealed bacteremia with MSSA and enterococcus faecalis. ID was consulted to assist with management of antibiotics. The patient was also found to have a right prosthetic knee infection as well as infection of his permanent pacemaker. He was deemed to be a poor candidate for removal of his pacemaker as well as AKA.  Therefore decision was made to finish 6 weeks of intravenous antibiotics and then lifelong suppression with amox/clav. Although there was initial improvement, the patient's serum creatinine has gradually risen. As result, nephrology was consulted to assist.  Assessment/Plan: Sepsis secondary to MSSA/Enterococcus bacteremia/MSSA Right prosthetic knee infection -Secondary to bacteremia and prosthetic knee infection and PPM infection -Presented with fever, tachycardia, tachypnea, leukocytosis -elevated procalcitonin  level as well as lactic acid -Initially placed on vancomycin and zosyn, and transitioned to Unasyn -Chest x-ray, UA unremarkable for infection  -CT chest showed subpleural fibrosis, no acute pulmonary infiltrates. -Influenza PCR, strep pneumonia urine antigen negative -Blood cultures 06/17/2016: MSSA, enterococcus  -sepsis physiology resolved  MSSA/Enterococcus bacteremia/MSSA Right prosthetic knee infection -Blood cultures 06/17/2016: MSSA, E faecalis -Repeat blood cultures 06/19/16 no growth to date -Echocardiogram (TTE)showed EF 55-60%, moderate AS, trivial MR/TR/AI -Infectious disease consulted and appreciated, recommended TEE and EP consult -Cardiology consulted and appreciated- TEE attempted multiple times on 06/22/16-  -Per cardiology, patient is at high risk of complication due to high risk comorbidities. No plan for device extraction at this time. -Orthopedic surgery consulted and appreciated for infected knee. S/p aspiration x2-->grew MSSA -Patient seen by Dr. Mayer Camel (ortho), patient would need an AKA, however patient seen as a poor surgical candidate. Ortho has discussed this with the patient's son -Patient will need IV unasyn for 6 weeks, and followed by PO amox/clavfor suppression thereafter. -06/22/16 PICC line -06/26/16--case discussed with ortho--no plans for surgery--monitor for serial knee aspirations, f/u office in one week after d/c; repeated R-knee aspiration 5/11, 5/13 -06/27/16--repeat blood culture as WBC trending up--remains negative -5/14--reconsult ID-->no new changes; anticipate leukocytosis due to inability to execute source control of infection -ESR--35, CRP--4.0  -plan Unasyn through 07/31/16, then start amox/clav bid indefinitely  Right and Leftlower extremity DVT -clinically provoked, but son states that pt has had 2 or 3 previous episodes of DVTs in past -son unclear why pt is not on chronic anticoagulation -LE doppler--acute DVT Right common femoral, femoral,  and popliteal veinsand Left common femoral and femoral vein -startedIV heparin -transitionedto po apixaban on 06/27/16  Dysphagia -Speech consulted  and recommended FULL liquids due to abnormal MBS results -will ask speech to re-evaluation again-->Dys 2 diet -son states pt was on regular diet prior to admission but did require food to be chopped up -discussed with son--he does not want to restrict diet, but does not seem to full accept or fully understand the risk of aspiration--"he's always eaten solid food without problems"  Acute Blood Loss Anemia -drop ~4grams Hgb in past 7days -FOBT--neg -GI consult appreciated-->bid PPI, no further intervention -CT abd/pelvis-neg for retroperitoneal bleed -LDH/Haptoglobin--no suggestive of hemolysis -repeat CBC in am  Anasarca/Fluid Excess -5/12-lasix 40 mg IV x 1 -5/13-lasix 20 mg IV x 1 -5/15 lasix 40 mg IV bid  Acute on Chronic kidney disease, stage III -Creatinine baseline at 1.3-1.6 -Serum creatinine peaked 2.18 -Creatinine trending up--likely fluid shifts, infectious process, -UA is bland -renal US--neg for hydronephrosis -Am BMP -nephrology consult appreciated-->start IV furosemide  Diarrhea -check Cdiff--negative -continue Flexiseal -slowing down/improved  Dermatophytosis/tinea cruris -Groin area appears to be erythematous and flaky -discontinuediflucan--pt finished one week -continue mycostatin topical -suspect pt has component of incontinence dermatitis -improved with condom cath  Essential hypertension -Currently on no home medications, continue IV hydralazine PRN  Diabetes mellitus, type II -Currently on no home medications -Continue insulin sliding scale, Levemir, CBG monitoring -06/25/16 check A1C--11.5 -increasenovolog 10units with meals -increase levemir to 10units bid  History of atrial fibrillation -CHADSVASC 5 (based on age, DM, HTN, CAD) -previously not on [facility] -Status post pacemaker  placement- cardiology consulted as patient does have bacteremia, as above, no plan for device extraction at this time.   Stage II pressure injury -moisture related -wound care consulted and appreciated -not infected on exam  Moderate aortic stenosis -Noted on echocardiogram  Cognitive impairment -Per son patient can sometimes be pleasantly confused  Hypokalemia -K 3.0, continue to replete -Monitor BMP -mag= 1.9  Hypomagnesemia -Magnesium 1.9 now  Goals of care -Had discuss with son via phone regarding patient, current treatment and plan, code status, mental status. Per son, patient's mental status is not always clear and at this time he would like to remain full code and possible have surgeries that might be needed. -Cardio and ortho recommend palliative care consult. Discussed with son, does not feel palliative care consult is needed right now.  DVT Prophylaxislovenox  Code Status:Full  Family Communication:updated son on phone-   Disposition Plan:SNF--son now agrees to SNF after earlier refusal    Consultants Infectious disease Cardiology Orthopedics  Procedures  Echocardiogram Right knee aspiration x5 Attempted TEE  Antibiotics vanco 5/2>>>5/6 Unasyn 5/7>>> Nafcillin 5/3>>5/7   Subjective: Patient complains of right knee pain although he states that this is slightly better than the past week. Denies any fevers, chills, chest pain, short of breath, nausea, vomiting, diarrhea, abdominal pain. No dysuria or hematuria. No rashes.  Objective: Vitals:   06/29/16 1400 06/29/16 2311 06/30/16 0633 06/30/16 1500  BP: (!) 138/56 (!) 128/47 (!) 131/58 (!) 126/52  Pulse: 75 70 70 68  Resp: _0 Temp: 98.2 F (36.8 C) 97.6 F (36.4 C) 97.2 F (36.2 C) 98.1 F (36.7 C)  TempSrc: Oral Oral Oral Oral  SpO2: 99% 98% 99% 96%  Weight:      Height:        Intake/Output Summary (Last 24 hours) at 06/30/16 1554 Last data filed at  06/30/16 1500  Gross per 24 hour  Intake              930 ml  Output  677 ml  Net              253 ml   Weight change:  Exam:   General:  Pt is alert, follows commands appropriately, not in acute distress  HEENT: No icterus, No thrush, No neck mass, [place]/AT  Cardiovascular: RRR, S1/S2, no rubs, no gallops  Respiratory: Bibasilar crackles. No wheezing. Good air movement.  Abdomen: Soft/+BS, non tender, non distended, no guarding  Extremities: 1 + LE edema, No lymphangitis, No petechiae, No rashes, no synovitis   Data Reviewed: I have personally reviewed following labs and imaging studies Basic Metabolic Panel:  Recent Labs Lab 06/25/16 0435 06/26/16 0409 06/27/16 0546 06/28/16 0338 06/29/16 0420 06/30/16 0421  NA 138 139 138 138 139 139  K 3.4* 3.5 3.5 3.5 3.4* 3.4*  CL 110 108 107 108 107 107  CO2 _0 GLUCOSE 140* 202* 224* 218* 162* 121*  BUN _1 CREATININE 1.71* 1.80* 1.89* 2.04* 2.18* 2.20*  CALCIUM 7.7* 7.9* 7.9* 7.9* 8.2* 8.3*  MG 1.8  --   --  1.9  --   --    Liver Function Tests:  Recent Labs Lab 06/27/16 0546  AST 14*  ALT 6*  ALKPHOS 52  BILITOT 1.1  PROT 5.8*  ALBUMIN 1.9*   No results for input(s): LIPASE, AMYLASE in the last 168 hours. No results for input(s): AMMONIA in the last 168 hours. Coagulation Profile: No results for input(s): INR, PROTIME in the last 168 hours. CBC:  Recent Labs Lab 06/26/16 0409  06/27/16 0542 06/27/16 1158 06/28/16 0338 06/29/16 0420 06/30/16 0421  WBC 13.8*  < > 15.1* 16.0* 17.1* 17.1* 16.5*  NEUTROABS 8.4*  --   --   --  12.2*  --   --   HGB 8.6*  < > 8.0* 7.7* 7.9* 7.6* 7.5*  HCT 25.7*  < > 24.1* 23.5* 23.7* 23.7* 22.9*  MCV 84.5  < > 84.3 84.5 85.6 87.1 87.7  PLT 527*  < > 507* 523* 556* 563* 575*  < > = values in this interval not displayed. Cardiac Enzymes: No results for input(s): CKTOTAL, CKMB, CKMBINDEX, TROPONINI in the last 168  hours. BNP: Invalid input(s): POCBNP CBG:  Recent Labs Lab 06/29/16 1235 06/29/16 1737 06/29/16 2310 06/30/16 0722 06/30/16 1146  GLUCAP 213* 196* 136* 113* 194*   HbA1C: No results for input(s): HGBA1C in the last 72 hours. Urine analysis:    Component Value Date/Time   COLORURINE STRAW (A) 06/27/2016 1541   APPEARANCEUR CLEAR 06/27/2016 1541   LABSPEC 1.008 06/27/2016 1541   PHURINE 6.0 06/27/2016 1541   GLUCOSEU 150 (A) 06/27/2016 1541   HGBUR NEGATIVE 06/27/2016 1541   BILIRUBINUR NEGATIVE 06/27/2016 1541   KETONESUR NEGATIVE 06/27/2016 1541   PROTEINUR NEGATIVE 06/27/2016 1541   UROBILINOGEN 0.2 05/18/2014 1634   NITRITE NEGATIVE 06/27/2016 1541   LEUKOCYTESUR NEGATIVE 06/27/2016 1541   Sepsis Labs: _2 (procalcitonin:4,lacticidven:4) ) Recent Results (from the past 240 hour(s))  C difficile quick scan w PCR reflex     Status: None   Collection Time: 06/26/16 11:47 AM  Result Value Ref Range Status   C Diff antigen NEGATIVE NEGATIVE Final   C Diff toxin NEGATIVE NEGATIVE Final   C Diff interpretation No C. difficile detected.  Final  Culture, Urine     Status: None   Collection Time: 06/27/16  3:41 PM  Result Value Ref Range Status   Specimen Description URINE,  CLEAN CATCH  Final   Special Requests NONE  Final   Culture   Final    NO GROWTH Performed at [facility], [place] [address], [place], [place] [address]    Report Status 06/28/2016 FINAL  Final  Culture, blood (Routine X 2) w Reflex to ID Panel     Status: None (Preliminary result)   Collection Time: 06/27/16  5:10 PM  Result Value Ref Range Status   Specimen Description BLOOD LEFT HAND  Final   Special Requests   Final    BOTTLES DRAWN AEROBIC AND ANAEROBIC Blood Culture adequate volume   Culture   Final    NO GROWTH 2 DAYS Performed at [facility], [place] [address], [place], [place] [address]    Report Status PENDING  Incomplete  Culture, blood (Routine X 2) w Reflex to ID  Panel     Status: None (Preliminary result)   Collection Time: 06/27/16  6:46 PM  Result Value Ref Range Status   Specimen Description BLOOD LEFT FOREARM  Final   Special Requests IN PEDIATRIC BOTTLE Blood Culture adequate volume  Final   Culture   Final    NO GROWTH 2 DAYS Performed at [facility], [place] [address], [place], [place] [address]    Report Status PENDING  Incomplete     Scheduled Meds: . apixaban  10 mg Oral BID   Followed by  . [START ON 07/04/2016] apixaban  5 mg Oral BID  . chlorhexidine  15 mL Mouth Rinse BID  . Chlorhexidine Gluconate Cloth  6 each Topical Q0600  . feeding supplement  1 Container Oral BID BM  . furosemide  40 mg Intravenous BID  . Gerhardt's butt cream   Topical TID  . insulin aspart  0-20 Units Subcutaneous TID WC  . insulin aspart  0-5 Units Subcutaneous QHS  . insulin aspart  10 Units Subcutaneous TID WC  . insulin detemir  10 Units Subcutaneous BID  . mouth rinse  15 mL Mouth Rinse q12n4p  . mupirocin ointment  1 application Nasal BID  . pantoprazole  40 mg Oral BID AC  . potassium chloride  40 mEq Oral Daily  . sodium chloride flush  10-40 mL Intracatheter Q12H   Continuous Infusions: . sodium chloride 10 mL/hr at 06/28/16 2300  . ampicillin-sulbactam (UNASYN) IV Stopped (06/30/16 3491)    Procedures/Studies: Ct Abdomen Pelvis Wo Contrast  Result Date: 06/26/2016 CLINICAL DATA:  Evaluate for retroperitoneal hematoma. Acute blood loss/anemia. EXAM: CT ABDOMEN AND PELVIS WITHOUT CONTRAST TECHNIQUE: Multidetector CT imaging of the abdomen and pelvis was performed following the standard protocol without IV contrast. COMPARISON:  04/17/2013 FINDINGS: Lower chest: There are small bilateral pleural effusions identified peer increased interstitial markings within the lung bases compatible with pulmonary edema. The heart size appears enlarged. Aortic atherosclerosis identified. RCA coronary artery calcifications are identified. Hepatobiliary:  No focal liver abnormality. Previous cholecystectomy. No biliary dilatation. Pancreas: Unremarkable. No pancreatic ductal dilatation or surrounding inflammatory changes. Spleen: Lobulated contour the spleen noted. Several accessory spleens are identified. Findings may reflect sequelae of prior splenic injury. Adrenals/Urinary Tract: The adrenal glands are normal. Large right kidney cyst is identified measuring 2.4 cm with small thin areas of mural calcification. There is no mass or hydronephrosis identified. The urinary bladder appears normal. Stomach/Bowel: The stomach is unremarkable. No abnormal dilatation of the small or large bowel loops. The appendix is visualized and appears normal. Sigmoid diverticulosis noted without acute inflammation. Vascular/Lymphatic: Aortic  atherosclerosis noted. No aneurysm. No upper abdominal or pelvic adenopathy. No inguinal adenopathy. Reproductive: Prostate is unremarkable. Other: No ascites or focal fluid collections identified. There is no evidence for retroperitoneal hematoma. Edema is identified compatible with anasarca which may be secondary to CHF. There is soft tissue edema involving both lower extremities. Musculoskeletal: Degenerative disc disease noted within the lumbar spine. IMPRESSION: 1. No evidence for hematoma. 2. Cardiac enlargement, Aortic Atherosclerosis (ICD10-I70.0). , pleural effusions, pulmonary edema, and anasarca compatible with congestive heart failure. 3. Right kidney cyst containing thin areas of mural calcification is incompletely characterized without IV contrast. Electronically Signed   By: Kerby Moors M.D.   On: 06/26/2016 15:46   Dg Chest 2 View  Result Date: 06/17/2016 CLINICAL DATA:  Lethargy and cough EXAM: CHEST  2 VIEW COMPARISON:  04/23/2014 FINDINGS: Cardiac shadow is stable. Pacing device is again noted. The lungs are well aerated bilaterally. No bony abnormality is noted. IMPRESSION: No active cardiopulmonary disease. Electronically  Signed   By: Inez Catalina M.D.   On: 06/17/2016 20:38   Ct Chest Wo Contrast  Result Date: 06/18/2016 CLINICAL DATA:  Lethargy and cough for 2 days EXAM: CT CHEST WITHOUT CONTRAST TECHNIQUE: Multidetector CT imaging of the chest was performed following the standard protocol without IV contrast. COMPARISON:  06/17/2016, CT 04/17/2016 FINDINGS: Cardiovascular: Limited evaluation without intravenous contrast. Aortic atherosclerosis. No aneurysmal dilatation. Coronary artery calcification. Partially visualized cardiac pacing leads. Mild cardiomegaly. No large pericardial effusion. Mediastinum/Nodes: Midline trachea. Few prominent sub- carinal and periesophageal lymph nodes, measuring up to 13 mm. Esophagus within normal limits. Esophagus Lungs/Pleura: Mild subpleural fibrosis. No focal consolidation, pleural effusion, or pneumothorax. Upper Abdomen: No acute abnormality. 9.6 cm cyst in the mid to upper right kidney. Vague hypodensity in the upper pole of the left kidney also consistent with a cyst Musculoskeletal: Degenerative changes of the spine. No acute or suspicious bone lesion. IMPRESSION: 1. Mild subpleural fibrosis. No acute pulmonary infiltrates are visualized. 2. Mild nonspecific mediastinal adenopathy 3. Cardiomegaly 4. Bilateral kidney cysts, measuring up to 9.6 cm on the right. Electronically Signed   By: Donavan Foil M.D.   On: 06/18/2016 00:26   US Renal  Result Date: 06/28/2016 CLINICAL DATA:  Renal failure. EXAM: RENAL / URINARY TRACT ULTRASOUND COMPLETE COMPARISON:  CT scan Jun 26, 2016 FINDINGS: Right Kidney: Length: 10.3 cm.  8.5 cm cyst.  Mild cortical thinning. Left Kidney: Length: 10.1 cm. Echogenicity within normal limits. No mass or hydronephrosis visualized. Bladder: Appears normal for degree of bladder distention. IMPRESSION: Right renal cyst.  No acute abnormality. Electronically Signed   By: Dorise Bullion III M.D   On: 06/28/2016 16:25   Dg Chest Port 1 View  Result Date:  06/23/2016 CLINICAL DATA:  Central line placement. EXAM: PORTABLE CHEST 1 VIEW COMPARISON:  06/17/2016 FINDINGS: Right arm PICC has its tip in the proximal right atrium. Withdrawal of 2 cm if this is desired to be above the right atrium. Pacemaker leads appear the same. Cardiomegaly. Chronic pulmonary scarring. IMPRESSION: Right arm PICC tip in the right atrium. Withdraw 2 cm to be at the SVC RA junction, if that is desired. Electronically Signed   By: Nelson Chimes M.D.   On: 06/23/2016 15:56   Dg Swallowing Func-speech Pathology  Result Date: 06/19/2016 Objective Swallowing Evaluation: Type of Study: MBS-Modified Barium Swallow Study Patient Details Name: GASPARE NETZEL MRN: [id number] Date of Birth: 08-22-1924 Today's Date: 06/19/2016 Time: SLP Start Time (ACUTE ONLY): 1520-SLP Stop Time (ACUTE ONLY):  1545 SLP Time Calculation (min) (ACUTE ONLY): 25 min Past Medical History: Past Medical History: Diagnosis Date . Anxiety  . Arthritis   R knee,  . CKD (chronic kidney disease)  . Coronary artery disease  . Diabetes mellitus without complication ([place])  . Dysrhythmia   PAF;,sss s/p St. Jude PPM . Hyperlipidemia  . Hypertension  . MI (myocardial infarction) ([place]) 2014 . Presence of permanent cardiac pacemaker  Past Surgical History: Past Surgical History: Procedure Laterality Date . CHOLECYSTECTOMY   . CORONARY ANGIOPLASTY    mLAD '13, RCA DES 03/2013 . EXCISIONAL TOTAL KNEE ARTHROPLASTY WITH ANTIBIOTIC SPACERS Right 04/20/2014  Procedure: IRRIGATION AND DEBRIDMENT RIGHT TOTAL KNEE REMOVE  ACL IMPLANTED AND PLACE SPACER;  Surgeon: [name], MD;  Location: [place];  Service: Orthopedics;  Laterality: Right; . HEMORROIDECTOMY   . KNEE SURGERY  2006 . PACEMAKER INSERTION   . PICC LINE PLACE PERIPHERAL ([place] [facility])  04/2014  R upper arm  . TOTAL KNEE ARTHROPLASTY WITH REVISION COMPONENTS Right 06/11/2014  Procedure: TOTAL KNEE ARTHROPLASTY WITH REVISION COMPONENTS REMOVE SPACER PLACE TKA;  Surgeon: [name], MD;  Location: [place];  Service: Orthopedics;  Laterality: Right; HPI: pt admit to [facility] with AMS, cough- found to be bacterial septic.  PMH + for DM, MI, no CVA hx. CT chest and CXR negative.  Swallow eval ordered due to pt having dysphagia.  Subjective: pt awake in chair Assessment / Plan / Recommendation CHL IP CLINICAL IMPRESSIONS 06/19/2016 Clinical Impression Pt presents with severe pharyngeal dysphagia - narrow pharynx and decreased tongue base retraction results in very poor epiglottic deflection and gross pharyngeal/vallecular residuals.  Pt requires multiple swallows (up to 5-6) with each bolus and still has vallecular residuals. He did not aspirate or penetrate surprisingly.  Residuals were much worse with solids/pudding than liquids.  Various strategies/postures including head turn left, multiple swallows, following solid with liquids mitigate dysphagia.  Pt did not fully clear vallecular space but can fortunately hock and expectorate to clear.  Pt reports his swallow ability during MBS is "normal" for him causing SLP to suspect baseline significant dysphagia that he has been managing.  Recommend strict precautions to mitigate aspiration risk and modify diet to full liquids. Using teach back, pt able to demonstrate multiple swallows, "hocking" and oral suctioning independently.  Will follow for po tolerance, readiness for dietary advancement.   SLP Visit Diagnosis Dysphagia, oropharyngeal phase (R13.12) Attention and concentration deficit following -- Frontal lobe and executive function deficit following -- Impact on safety and function Severe aspiration risk;Risk for inadequate nutrition/hydration   CHL IP TREATMENT RECOMMENDATION 06/19/2016 Treatment Recommendations Therapy as outlined in treatment plan below   Prognosis 06/19/2016 Prognosis for Safe Diet Advancement Fair Barriers to Reach Goals Time post onset Barriers/Prognosis Comment -- CHL IP DIET RECOMMENDATION 06/19/2016 SLP Diet Recommendations Nectar thick liquid;Thin  liquid Liquid Administration via Cup;Straw Medication Administration (No Data) Compensations Slow rate;Small sips/bites;Follow solids with liquid;Multiple dry swallows after each bite/sip Postural Changes Seated upright at 90 degrees;Remain semi-upright after after feeds/meals (Comment)   CHL IP OTHER RECOMMENDATIONS 06/19/2016 Recommended Consults -- Oral Care Recommendations Oral care before and after PO Other Recommendations --   CHL IP FOLLOW UP RECOMMENDATIONS 06/19/2016 Follow up Recommendations (No Data)   CHL IP FREQUENCY AND DURATION 06/19/2016 Speech Therapy Frequency (ACUTE ONLY) min 2x/week Treatment Duration 1 week      CHL IP ORAL PHASE 06/19/2016 Oral Phase WFL Oral - Pudding Teaspoon -- Oral - Pudding Cup -- Oral - Honey  Teaspoon -- Oral - Honey Cup -- Oral - Nectar Teaspoon -- Oral - Nectar Cup WFL Oral - Nectar Straw WFL Oral - Thin Teaspoon -- Oral - Thin Cup WFL Oral - Thin Straw WFL Oral - Puree WFL Oral - Mech Soft WFL Oral - Regular WFL Oral - Multi-Consistency -- Oral - Pill -- Oral Phase - Comment --  CHL IP PHARYNGEAL PHASE 06/19/2016 Pharyngeal Phase Impaired Pharyngeal- Pudding Teaspoon -- Pharyngeal -- Pharyngeal- Pudding Cup -- Pharyngeal -- Pharyngeal- Honey Teaspoon -- Pharyngeal -- Pharyngeal- Honey Cup -- Pharyngeal -- Pharyngeal- Nectar Teaspoon -- Pharyngeal -- Pharyngeal- Nectar Cup Reduced pharyngeal peristalsis;Pharyngeal residue - valleculae;Reduced tongue base retraction Pharyngeal -- Pharyngeal- Nectar Straw Reduced pharyngeal peristalsis;Reduced tongue base retraction;Pharyngeal residue - valleculae Pharyngeal -- Pharyngeal- Thin Teaspoon -- Pharyngeal -- Pharyngeal- Thin Cup Reduced epiglottic inversion;Reduced tongue base retraction;Reduced pharyngeal peristalsis Pharyngeal -- Pharyngeal- Thin Straw Reduced pharyngeal peristalsis;Reduced epiglottic inversion;Pharyngeal residue - valleculae Pharyngeal -- Pharyngeal- Puree Reduced epiglottic inversion;Reduced pharyngeal  peristalsis;Pharyngeal residue - valleculae Pharyngeal -- Pharyngeal- Mechanical Soft -- Pharyngeal -- Pharyngeal- Regular Reduced pharyngeal peristalsis;Reduced epiglottic inversion;Reduced tongue base retraction;Pharyngeal residue - valleculae Pharyngeal -- Pharyngeal- Multi-consistency -- Pharyngeal -- Pharyngeal- Pill -- Pharyngeal -- Pharyngeal Comment pt without awareness to mild leftover vallecular residuals, he is able to clear/expectorate with cues   CHL IP CERVICAL ESOPHAGEAL PHASE 06/19/2016 Cervical Esophageal Phase WFL Pudding Teaspoon -- Pudding Cup -- Honey Teaspoon -- Honey Cup -- Nectar Teaspoon -- Nectar Cup -- Nectar Straw -- Thin Teaspoon -- Thin Cup -- Thin Straw -- Puree -- Mechanical Soft -- Regular -- Multi-consistency -- Pill -- Cervical Esophageal Comment -- No flowsheet data found. Luanna Salk, MS [facility] SLP [phone]               Clemma Johnsen, DO  [facility] Pager [phone]  If 7PM-7AM, please contact night-coverage [email or web address] Password [email or web address] 06/30/2016, 3:54 PM   LOS: 13 days

## 2016-06-30 NOTE — Progress Notes (Signed)
Nutrition Follow-up  DOCUMENTATION CODES:   Not applicable  INTERVENTION:  - Continue Boost Breeze BID and Magic Cup BID. - Continue to encourage PO intakes of meals and supplements.  NUTRITION DIAGNOSIS:   Unintentional weight loss related to acute illness as evidenced by percent weight loss. -ongoing  GOAL:   Patient will meet greater than or equal to 90% of their needs -likely met on average  MONITOR:   PO intake, Supplement acceptance, Weight trends, Labs, Skin  ASSESSMENT:   83 y.o. gentleman with a history of mild dementia/cognitive impairment, CAD with prior stent placement, history of atrial fibrillation S/P PPM implant (CHADS-Vasc score of at least four, no anticoagulation), HTN, HLD, DM, and CKD 3 with baseline creatinine around 1.3-1.5 who was referred to the ED via EMS by one of his caregivers for evaluation of increased lethargy, mental status changes, and cough.  The patient is nonambulatory at baseline and spends most of his day sitting up a recliner.  He has caregiver support 24 hours/day.  He has had cough for 4-5 days.  He has had two known sick contacts (son-in-law and caregiver).   5/15 Per char review, pt consumed 100% of lunch 5/13 (~555 kcal and 24 grams of protein), 100% of lunch 5/14 (~595 kcal and 24 grams of protein) and 50% of breakfast today (~405 kcal and 14 grams of protein). Pt has been accepting Boost Breeze supplements. He is having some difficulty with swallowing and is on a Dysphagia 2, thin liquids diet. He has not been experiencing any abdominal discomfort with PO intakes. No new weight since 5/8. His last R knee aspiration was 5/13 and 10cc fluid removed at that time.  Medications reviewed; 40 mg IV Lasix BID, sliding scale Novolog, 10 units Novolog TID, 10 units Levemir BID, 40 mg oral Protonix BID.  Labs reviewed; CBGs: 113 and 194 mg/dL today, K: 3.4 mmol/L, creatinine: 2.2 mg/dL, Ca: 8.3 mg/dL, GFR: 25 mL/min.   5/10 - Per chart review, pt  consumed 40% of breakfast yesterday (~230 kcal and 3 grams of protein) and 100% of breakfast this AM (~520 kcal and 9 grams of protein).  - No intakes documented from lunch or dinner yesterday.  - Weight + 2.6 kg from 5/5-5/8 and no new weight since that time.  - Reviewed Ortho MD note from this AM which states possible need for perc drain to R knee but that pt is not a candidate for AKA d/t unlikely to survive surgery and recommendation for Palliative Care.    5/7 - Pt currently on FLD, thin liquids.  - He was at [facility] this AM for TEE and was NPO since midnight for the same.  - RN ordered lunch tray for pt ~20 minutes ago.  - Pt with some confusion throughout conversation but mainly answers seem appropriate.  - No family/visitors present at this time.  - No intakes documented since previous assessment and pt unable to recall with certainty if he has been eating meals/eating well at meals.  - He usually has a good appetite and that he cooks for himself at home. - He denies any abdominal pain or nausea.  - RN provided pt with a Boost Breeze shortly before RD visit; pt consumed 100% of this supplement and is interested in continuing to receive it.  - Weight + 3.6 kg from 5/3-5/5.  - Physical assessment done during this visit and shows moderated edema to BLE with no muscle and no fat wasting. - Dr. Aldine Contes note from  this AM states pt to possibly have AKA.  K: 3.2 mmol/L, Phos: 2 mg/dL, Mg: 1.6 mg/dL.   Diet Order:  DIET DYS 2 Room service appropriate? Yes; Fluid consistency: Thin  Skin:  Reviewed, no issues (groin and sacral MASD)  Last BM:  5/15  Height:   Ht Readings from Last 1 Encounters:  06/18/16 _0  (1.803 m)    Weight:   Wt Readings from Last 1 Encounters:  06/23/16 212 lb 4.9 oz (96.3 kg)    Ideal Body Weight:  78.18 kg  BMI:  Body mass index is 29.61 kg/m.  Estimated Nutritional Needs:   Kcal:  1620-1805 (18-20 kcal/kg)  Protein:  75-90 grams  Fluid:   1.8-2 L/day  EDUCATION NEEDS:   No education needs identified at this time    Jarome Matin, MS, RD, LDN, CNSC Inpatient Clinical Dietitian Pager # [phone] After hours/weekend pager # [phone]

## 2016-06-30 NOTE — Clinical Social Work Note (Signed)
Clinical Social Work Assessment  Patient Details  Name: Randy [name] MRN: [id number] Date of Birth: 10-26-1924  Date of referral:  06/30/16               Reason for consult:  Facility Placement                Permission sought to share information with:  Chartered certified accountant granted to share information::  Yes, Verbal Permission Granted  Name::        Agency::     Relationship::     Contact Information:     Housing/Transportation Living arrangements for the past 2 months:  Single Family Home Source of Information:  Adult Children (son Pauletta Browns) Patient Interpreter Needed:  None Criminal Activity/Legal Involvement Pertinent to Current Situation/Hospitalization:    Significant Relationships:   (son, daughter and son in law) Lives with:  Adult Children (daughter and son in law) Do you feel safe going back to the place where you live?    Need for family participation in patient care:  Yes (Comment)  Care giving concerns:  None reported   Social Worker assessment / plan:  LCSW consulted for SNF/rehab.  LCSW reviewed patient chart reflecting dementia diagnosis.  LCSW called and spoke with patient's son [name] who is the [facility] for patient.  LCSW sent patient information through the HUB and provided son with bed options.  LCSW will follow up with choice of rehab and will coordinate discharge.  [facility] knowing that there could be a flexiseal.   Employment status:  Retired Nurse, adult PT Recommendations:  [place] / Referral to community resources:     Patient/Family's Response to care:  Patient's son reported that he was the [facility] and durable POA for patient and all of his siblings.  Patient's son reported that patient had been living with his daughter and son in law and that they were in good shape with private duty nurses.  Patient's son reported that he was willing to have patient go  to rehab if hospital thought it would benefit him but he was also good at home.  Patient's son reported that he would research the bed offers and let SW know in the morning who he chose.   Patient/Family's Understanding of and Emotional Response to Diagnosis, Current Treatment, and Prognosis:  Patient's son is his HCPOA and he understands patient's diagnoses.  Patient has diagnosis of dementia.  Patient's son hopeful patient will continue to progress with his PT  Emotional Assessment Appearance:  Appears stated age Attitude/Demeanor/Rapport:  Lethargic Affect (typically observed):  Accepting Orientation:  Oriented to Self Alcohol / Substance use:    Psych involvement (Current and /or in the community):     Discharge Needs  Concerns to be addressed:    Readmission within the last 30 days:    Current discharge risk:  None Barriers to Discharge:  No Barriers Identified   Carlean Jews, LCSW 06/30/2016, 3:28 PM

## 2016-06-30 NOTE — Progress Notes (Signed)
[place] for apixaban Indication: DVT  No Known Allergies  Patient Measurements: Height: 5\' 11"  (180.3 cm) Weight: 212 lb 4.9 oz (96.3 kg) IBW/kg (Calculated) : 75.3 Heparin Dosing Weight: 94 kg  Vital Signs: Temp: 97.2 F (36.2 C) (05/15 0633) Temp Source: Oral (05/15 0633) BP: 131/58 (05/15 5916) Pulse Rate: 70 (05/15 0633)  Labs:  Recent Labs  06/28/16 0338 06/29/16 0420 06/30/16 0421  HGB 7.9* 7.6* 7.5*  HCT 23.7* 23.7* 22.9*  PLT 556* 563* 575*  CREATININE 2.04* 2.18* 2.20*    Estimated Creatinine Clearance: 26.4 mL/min (A) (by C-G formula based on SCr of 2.2 mg/dL (H)).   Medical History: Past Medical History:  Diagnosis Date  . Anxiety   . Arthritis    R knee,   . CKD (chronic kidney disease)   . Coronary artery disease   . Diabetes mellitus without complication ([place])   . Dysrhythmia    PAF;,sss s/p St. Jude PPM  . Hyperlipidemia   . Hypertension   . MI (myocardial infarction) ([place]) 2014  . Presence of permanent cardiac pacemaker     Assessment: 27 yoM admitted for sepsis secondary to MSSA/Enterococcus bacteremia, recurrent right prosthetic knee infection, groin cellulitis.  Bilateral lower extremity venous duplex completed today shows bilateral acute DVTs. Pt was initially on prophylactic enoxaparin then heparin to apixaban 5/12,   Today, 06/30/2016: - CBC: Hgb - low (trending down), pltc elevated - Renal: SCr trending up  - no dose adjustment recommended in renal impairment however pts with Scr > 2.5 mg/dL or CrCl < 76ml/min were excluded from the clinical trials.   - s/p knee aspiration 5/12 - abd CT on 5/11 neg for bleed -- GI added PPI and signed off  Goal of Therapy:  apixaban per indication  Plan:  - Eliquis 10mg  twice daily for 7 days followed by 5mg  twice daily - Need to watch renal function and may need to consider alternative anticoagulation if Scr continues to worsen  - monitor for s/s  bleeding - Patient educated 5/14  Doreene Eland, PharmD, BCPS.   Pager: [phone] 06/30/2016 11:12 AM

## 2016-06-30 NOTE — Clinical Social Work Placement (Signed)
   CLINICAL SOCIAL WORK PLACEMENT  NOTE  Date:  06/30/2016  Patient Details  Name: Randy [name] MRN: [id number] Date of Birth: 10/16/1924  Clinical Social Work is seeking post-discharge placement for this patient at the [place] level of care (*CSW will initial, date and re-position this form in  chart as items are completed):  Yes   Patient/family provided with [place] Work Department's list of facilities offering this level of care within the geographic area requested by the patient (or if unable, by the patient's family).  Yes   Patient/family informed of their freedom to choose among providers that offer the needed level of care, that participate in Medicare, Medicaid or managed care program needed by the patient, have an available bed and are willing to accept the patient.  Yes   Patient/family informed of [facility]'s ownership interest in [facility] and [facility], as well as of the fact that they are under no obligation to receive care at these facilities.  PASRR submitted to EDS on       PASRR number received on       Existing PASRR number confirmed on 06/30/16     FL2 transmitted to all facilities in geographic area requested by pt/family on 06/30/16     FL2 transmitted to all facilities within larger geographic area on       Patient informed that his/her managed care company has contracts with or will negotiate with certain facilities, including the following:            Patient/family informed of bed offers received.  Patient chooses bed at       Physician recommends and patient chooses bed at      Patient to be transferred to   on  .  Patient to be transferred to facility by       Patient family notified on   of transfer.  Name of family member notified:        PHYSICIAN       Additional Comment:    _______________________________________________ Carlean Jews, [place] 06/30/2016, 3:36 PM

## 2016-06-30 NOTE — Progress Notes (Signed)
PATIENT ID: Randy [name]  MRN: [id number]  DOB/AGE:  04-13-1924 / 84 y.o.  8 Days Post-Op Procedure(s): aborted TEE    PROGRESS NOTE Subjective: Patient is alert, oriented, no Nausea, no Vomiting, yes passing gas. Taking PO Still has significant swallowing problems. Denies SOB, Chest or Calf Pain. Using Incentive Spirometer, PAS in place. Ambulate Initially, nonambulatory, which is baseline, Patient reports pain as 1/10 .    Objective: Vital signs in last 24 hours: Vitals:   06/29/16 0630 06/29/16 1400 06/29/16 2311 06/30/16 0633  BP: 139/66 (!) 138/56 (!) 128/47 (!) 131/58  Pulse: 70 75 70 70  Resp: 20 20 18 18   Temp: 98.1 F (36.7 C) 98.2 F (36.8 C) 97.6 F (36.4 C) 97.2 F (36.2 C)  TempSrc: Oral Oral Oral Oral  SpO2: 99% 99% 98% 99%  Weight:      Height:          Intake/Output from previous day: I/O last 3 completed shifts: In: 1330 [P.O.:720; I.V.:310; IV Piggyback:300] Out: 7048 [Urine:1425; Stool:250]   Intake/Output this shift: No intake/output data recorded.   LABORATORY DATA:  Recent Labs  06/29/16 0420  06/29/16 1737 06/29/16 2310 06/30/16 0421 06/30/16 0722  WBC 17.1*  --   --   --  16.5*  --   HGB 7.6*  --   --   --  7.5*  --   HCT 23.7*  --   --   --  22.9*  --   PLT 563*  --   --   --  575*  --   NA 139  --   --   --  139  --   K 3.4*  --   --   --  3.4*  --   CL 107  --   --   --  107  --   CO2 24  --   --   --  24  --   BUN 10  --   --   --  11  --   CREATININE 2.18*  --   --   --  2.20*  --   GLUCOSE 162*  --   --   --  121*  --   GLUCAP  --   < > 196* 136*  --  113*  CALCIUM 8.2*  --   --   --  8.3*  --   < > = values in this interval not displayed.  Examination: Patient still has impressive bilateral pitting edema consistent with the femoral vein DVTs.  Dystrophic changes to the skin bilaterally to the lower extremities.  The right total knee has a trace effusion, minimally tender to palpation.  The aspiration on Sunday, 2 days ago  yielded 10 cc of purulent fluid.  Assessment:   83 year old man with significant and severe comorbidities including bilateral deep DVTs in the femoral veins of both legs, dementia, history of sepsis last week that seeded his revision right total knee. Any surgery on this patient in and of itself would be life-threatening, and I am not sure he would heal the wound.  If he survived.   Plan:  Continue close monitoring of the right total knee.  The fluid re-accumulates, we will again aspirate.  Continue suppressive antibiotics.    Kerin Salen 06/30/2016, 7:36 AM Patient ID: Randy [name], male   DOB: 10/22/1924, 83 y.o.   MRN: [id number]

## 2016-06-30 NOTE — Progress Notes (Signed)
[facility] Progress Note   Subjective: creat up slightly.  UOP down 600 cc / 24 hrs.   Vitals:   06/29/16 1400 06/29/16 2311 06/30/16 0633 06/30/16 1500  BP: (!) 138/56 (!) 128/47 (!) 131/58 (!) 126/52  Pulse: 75 70 70 68  Resp: 20 18 18 16   Temp: 98.2 F (36.8 C) 97.6 F (36.4 C) 97.2 F (36.2 C) 98.1 F (36.7 C)  TempSrc: Oral Oral Oral Oral  SpO2: 99% 98% 99% 96%  Weight:      Height:        Inpatient medications: . apixaban  10 mg Oral BID   Followed by  . [START ON 07/04/2016] apixaban  5 mg Oral BID  . chlorhexidine  15 mL Mouth Rinse BID  . Chlorhexidine Gluconate Cloth  6 each Topical Q0600  . feeding supplement  1 Container Oral BID BM  . furosemide  40 mg Intravenous BID  . Gerhardt's butt cream   Topical TID  . insulin aspart  0-20 Units Subcutaneous TID WC  . insulin aspart  0-5 Units Subcutaneous QHS  . insulin aspart  10 Units Subcutaneous TID WC  . insulin detemir  10 Units Subcutaneous BID  . mouth rinse  15 mL Mouth Rinse q12n4p  . mupirocin ointment  1 application Nasal BID  . pantoprazole  40 mg Oral BID AC  . potassium chloride  40 mEq Oral Daily  . sodium chloride flush  10-40 mL Intracatheter Q12H   . sodium chloride 10 mL/hr at 06/28/16 2300  . ampicillin-sulbactam (UNASYN) IV Stopped (06/30/16 0651)   hydrALAZINE, sodium chloride flush  Exam: Gen chron ill appearing, awake responsive No jvd or bruits Chest clear bilat RRR no MRG Abd soft ntnd no mass or ascites +bs GU normal male w condom cath draining clear yellowish urine MS R knee tender, mildly swollen Ext 2+ RLE edema diffusely, 1+ LLE edema, no UE edema / no wounds or ulcers Neuro is alert, Ox 3 ,gen'd weakness, poor movement of LE's  IP Antibiotics >  Diflucan = dc'd on 5/10 Nystatin powder dc'd on 5/9 Vanc = dc'd on 5/7 Nafcillin = dc'd on 5/7 Unasyn 5/7 > current  UA 5/12 > clear, no bact, glu 150 prot neg, rbc / wbc 0-5  Renal US May 13 >  - Right  Kidney 10.3 cm. 8.5 cm cyst. Mild cortical thinning - Left Kidney: Length: 10.1 cm. Echogenicity within normal limits. No mass or hydronephrosis visualized.  I/O since admission 5/2 > 23 L in and 16 L out (+7 L) Wts = 81 kg on admission 5/2 and 96kg on 5/8 (last wt)  Assessment: 1  Acute on CKD - baseline creat 1.3 in March, AKI in setting of acute R knee infection w bacteremia. Not surg candidate per ortho. Creat is possibly stabilizing.  On IV abx.  Vol overloaded still, have resumed IV lasix 40 q 12 hrs.  2  Septic R knee prosthesis - not surg candidate, on IV abx for enterococcus/ MSSA bacteremia 3  Debility 4  Hx PPM 5  HTN bp's stable 6  Hx MI   Plan - as above   Kelly Splinter MD [facility] pager [phone]   06/30/2016, 4:18 PM    Recent Labs Lab 06/28/16 0338 06/29/16 0420 06/30/16 0421  NA 138 139 139  K 3.5 3.4* 3.4*  CL 108 107 107  CO2 23 24 24   GLUCOSE 218* 162* 121*  BUN 10 10 11   CREATININE  2.04* 2.18* 2.20*  CALCIUM 7.9* 8.2* 8.3*    Recent Labs Lab 06/27/16 0546  AST 14*  ALT 6*  ALKPHOS 52  BILITOT 1.1  PROT 5.8*  ALBUMIN 1.9*    Recent Labs Lab 06/26/16 0409  06/28/16 0338 06/29/16 0420 06/30/16 0421  WBC 13.8*  < > 17.1* 17.1* 16.5*  NEUTROABS 8.4*  --  12.2*  --   --   HGB 8.6*  < > 7.9* 7.6* 7.5*  HCT 25.7*  < > 23.7* 23.7* 22.9*  MCV 84.5  < > 85.6 87.1 87.7  PLT 527*  < > 556* 563* 575*  < > = values in this interval not displayed. Iron/TIBC/Ferritin/ %Sat    Component Value Date/Time   IRON 78 06/27/2016 0546   TIBC 161 (L) 06/27/2016 0546   FERRITIN 896 (H) 06/27/2016 0546   IRONPCTSAT 48 (H) 06/27/2016 2620

## 2016-06-30 NOTE — Care Management Important Message (Signed)
Important Message  Patient Details IM Letter given to [name]/Case Manager to present to Patient Name: Randy [name] MRN: [id number] Date of Birth: [birth year]   Medicare Important Message Given:  Yes    Kerin Salen 06/30/2016, 11:07 AM

## 2016-07-01 LAB — GLUCOSE, CAPILLARY
GLUCOSE-CAPILLARY: 136 mg/dL — AB (ref 65–99)
GLUCOSE-CAPILLARY: 114 mg/dL — AB (ref 65–99)
Glucose-Capillary: 158 mg/dL — ABNORMAL HIGH (ref 65–99)
Glucose-Capillary: 150 mg/dL — ABNORMAL HIGH (ref 65–99)

## 2016-07-01 NOTE — Progress Notes (Signed)
Occupational Therapy Treatment Patient Details Name: Randy [name] MRN: [id number] DOB: [birth year] Today's Date: 07/01/2016    History of present illness Randy [name] is a 83 y.o. gentleman with a history of mild dementia/cognitive impairment, CAD with prior stent placement, history of atrial fibrillation S/P PPM implant  HTN, HLD, DM, and CKD ,infected right TKA(multiple surgeries.) was referred to the ED via EMS by one of his caregivers for evaluation of increased lethargy, mental status changes, and cough.  TPer historypatient is nonambulatory at baseline and spends most of his day sitting up a recliner. Aspiration of the  right knee performed.   OT comments  Pt agreeable to BUe exercise.  Pt did well tolerating this.  OT used pillow for pt to hold for a little challenge. Pt did well- may be able to tolerate 1 pound weights.   Follow Up Recommendations  SNF    Equipment Recommendations  None recommended by OT    Recommendations for Other Services      Precautions / Restrictions Precautions Precautions: Fall Precaution Comments: Flexisealm incontince.   Condom cath Restrictions Weight Bearing Restrictions: No       Mobility Bed Mobility       Pt able to reposition in the bed using bed rails and following instructions of OT. Pt very crooked in bed and able to walk legs over and straighten out trunk to be more straight in the bed            Transfers    NT                  Balance                            NT               ADL either performed or assessed with clinical judgement   ADL   Eating/Feeding: Set up;Bed level Eating/Feeding Details (indicate cue type and reason): HOB raised Grooming: Set up;Bed level Grooming Details (indicate cue type and reason): HOb raised                                     Vision              Cognition Arousal/Alertness: Awake/alert Behavior During Therapy: WFL for tasks  assessed/performed Overall Cognitive Status: Within Functional Limits for tasks assessed                                          Exercises General Exercises - Upper Extremity Shoulder Flexion: AROM;Both;20 reps Elbow Flexion: AROM;20 reps Elbow Extension: 20 reps;Both;AROM Wrist Extension: AROM;20 reps;Both Digit Composite Flexion: AROM;20 reps;Both   Shoulder Instructions       General Comments      Pertinent Vitals/ Pain       Faces Pain Scale: Hurts a little bit Pain Location: R knee Pain Descriptors / Indicators: Discomfort Pain Intervention(s): Monitored during session;Repositioned         Frequency  Min 2X/week        Progress Toward Goals  OT Goals(current goals can now be found in the care plan section)  Progress towards OT goals: OT to reassess next treatment     Plan Discharge plan remains appropriate;Discharge plan  needs to be updated    Co-evaluation                 AM-PAC PT "6 Clicks" Daily Activity     Outcome Measure   Help from another person eating meals?: A Little Help from another person taking care of personal grooming?: A Little Help from another person toileting, which includes using toliet, bedpan, or urinal?: Total Help from another person bathing (including washing, rinsing, drying)?: Total Help from another person to put on and taking off regular upper body clothing?: Total Help from another person to put on and taking off regular lower body clothing?: Total 6 Click Score: 10    End of Session    OT Visit Diagnosis: Muscle weakness (generalized) (M62.81)   Activity Tolerance Patient tolerated treatment well   Patient Left with call bell/phone within reach;in bed   Nurse Communication Mobility status        Time: 8177-1165 OT Time Calculation (min): 12 min  Charges: OT General Charges $OT Visit: 1 Procedure OT Treatments $Therapeutic Exercise: 8-22 mins  [place],  [place]   [name] D 07/01/2016, 11:00 AM

## 2016-07-01 NOTE — Progress Notes (Signed)
Physical Therapy Treatment Patient Details Name: BRAXDEN LOVERING MRN: [id number] DOB: [birth year] Today's Date: 07/01/2016    History of Present Illness Niall Illes Beer is a 83 y.o. gentleman with a history of mild dementia/cognitive impairment, CAD with prior stent placement, history of atrial fibrillation S/P PPM implant  HTN, HLD, DM, and CKD ,infected right TKA(multiple surgeries.) was referred to the ED via EMS by one of his caregivers for evaluation of increased lethargy, mental status changes, and cough.  TPer historypatient is nonambulatory at baseline and spends most of his day sitting up a recliner. Aspiration of the  right knee performed.    PT Comments    Pt agree to work with PT. Max assist +2 for supine<>sit. Pt sat EOB ~3-4 minutes with Min guard assist. He c/o fatigue and dizziness before requesting to return to supine. No family present during session. Will continue to follow.     Follow Up Recommendations  SNF (unless son decides to take pt back home-then HHPT);Supervision/Assistance - 24 hour      Equipment Recommendations  None recommended by PT    Recommendations for Other Services       Precautions / Restrictions Precautions Precautions: Fall Precaution Comments: Condom cath Restrictions Weight Bearing Restrictions: No    Mobility  Bed Mobility Overal bed mobility: Needs Assistance Bed Mobility: Supine to Sit;Sit to Supine     Supine to sit: Max assist;+2 for physical assistance;+2 for safety/equipment;HOB elevated Sit to supine: Max assist;+2 for physical assistance;+2 for safety/equipment;HOB elevated   General bed mobility comments: Assist for trunk and bil LEs. Utilized bedpad for scooting, positioning. Pt was able to reach for bedrail to assist a little. Sat EOB for ~3-4 mintues. Pt c/o dizziness. Assisted back to supine at pt's request.   Transfers                 General transfer comment: NT-pt unable on today due to fatigue, dizziness.    Ambulation/Gait                 Stairs            Wheelchair Mobility    Modified Rankin (Stroke Patients Only)       Balance Overall balance assessment: Needs assistance Sitting-balance support: Bilateral upper extremity supported;Feet supported Sitting balance-Leahy Scale: Fair Sitting balance - Comments: Mild posterior lean. No LOB.                                     Cognition Arousal/Alertness: Awake/alert Behavior During Therapy: WFL for tasks assessed/performed Overall Cognitive Status: History of cognitive impairments - at baseline                                        Exercises General Exercises - Upper Extremity Shoulder Flexion: AROM;Both;20 reps Elbow Flexion: AROM;20 reps Elbow Extension: 20 reps;Both;AROM Wrist Extension: AROM;20 reps;Both Digit Composite Flexion: AROM;20 reps;Both    General Comments        Pertinent Vitals/Pain Pain Assessment: Faces Faces Pain Scale: Hurts little more Pain Location: R knee Pain Descriptors / Indicators: Discomfort Pain Intervention(s): Limited activity within patient's tolerance;Repositioned    Home Living                      Prior Function  PT Goals (current goals can now be found in the care plan section) Progress towards PT goals: Progressing toward goals    Frequency    Min 3X/week      PT Plan Current plan remains appropriate    Co-evaluation              AM-PAC PT "6 Clicks" Daily Activity  Outcome Measure  Difficulty turning over in bed (including adjusting bedclothes, sheets and blankets)?: A Lot Difficulty moving from lying on back to sitting on the side of the bed? : A Lot Difficulty sitting down on and standing up from a chair with arms (e.g., wheelchair, bedside commode, etc,.)?: Total Help needed moving to and from a bed to chair (including a wheelchair)?: Total Help needed walking in hospital room?:  Total Help needed climbing 3-5 steps with a railing? : Total 6 Click Score: 8    End of Session   Activity Tolerance: Patient limited by fatigue;Patient limited by pain Patient left: in bed;with call bell/phone within reach   PT Visit Diagnosis: Muscle weakness (generalized) (M62.81);Difficulty in walking, not elsewhere classified (R26.2)     Time: 3817-7116 PT Time Calculation (min) (ACUTE ONLY): 11 min  Charges:  $Therapeutic Activity: 8-22 mins                    G Codes:          Weston Anna, MPT Pager: [phone]

## 2016-07-01 NOTE — Progress Notes (Signed)
PROGRESS NOTE  Randy [name] ZOX:[id number] DOB: 01-08-1925 DOA: 06/17/2016 PCP: System, Pcp Not In  HPI/Recap of past 24 hours:  83 y.o.gentleman with a history of mild dementia/cognitive impairment, CAD with prior stent placement, history of atrial fibrillation S/P PPM implant (CHADS-Vasc score of at least four, no anticoagulation), HTN, HLD, DM, and CKD 3 with baseline creatinine around 1.3-1.5 who was referred to the ED via EMS by one of his caregivers for evaluation of increased lethargy, mental status changes, and productive cough with yellowish sputum 4-5 days.. Patient at baseline nonambulatory and spends most of time in recliner and requires caregiver support 24 hours a day.   On 06/17/16, he was noted to have increased lethargy and muffled speech with slurring. No other focal weakenss. 911 was called and the patient was transferred to the ED for evaluation. Further workup revealed bacteremia with MSSA and enterococcus faecalis. ID was consulted to assist with management of antibiotics. The patient was also found to have a right prosthetic knee infection as well as infection of his permanent pacemaker. He was deemed to be a poor candidate for removal of his pacemaker as well as AKA.  Therefore decision was made to finish 6 weeks of intravenous antibiotics and then lifelong suppression with amox/clav. Although there was initial improvement, the patient's serum creatinine has gradually risen. As result, nephrology was consulted to assist.  Assessment/Plan: Principal Problem:   Sepsis ([place]) secondary to MSSA/enterococcus with MSSA right prosthetic knee infection and pacemaker: Patient met criteria on admission given lactic acidosis elevated pro calcitonin, fever, tachycardia, tachypnea and leukocytosis with source from knee as well as pacemaker. Chest x-ray and urinalysis unremarkable. Strep pneumo and influenza PCR also negative. But cultures on 5/2 grew out positive for MSSA and enterococcus.  Sepsis and felt felt to be stable. Infectious disease consulted to assist with antibiotic management. Patient felt to be poor candidate for removal of pacemaker as well as AKA, therefore decision made to go with 6 weeks of IV antibiotics and then lifelong suppression with Augmentin. Active Problems:   Hypertension: Stable   Diabetes mellitus ([place])   Acute kidney injury in the setting of Chronic kidney disease (CKD), stage III (moderate): Patient's serum creatinine rose following improvement from IV hydration. Initially felt to be secondary to sepsis. Nephrology consulted. Now felt to be secondary to perfusion. Seen by nephrology and IV Lasix restarted.  Chronic atrial fibrillation: Chads 2 score of 5. Previous and not on anticoagulation. Unclear why previously not on anticoagulation. Started on here following discovery of DVT bilaterally. Status post pacemaker placement. At this time, no plans for device extraction given high mortality despite bacteremia     Stage II Pressure injury of skin: Which related. Present on admission. Seen by wound care. No signs of infection.  Moderate aortic stenosis: Noted on echocardiogram. Watching input and output.    Late onset Alzheimer's disease without cognitive impairment: Waxes and wanes. Stable.   Leg DVT (deep venous thromboembolism), acute, bilateral ([place]): Patient is a 2 or 3 previous episodes of DVTs in the past. Started on IV heparin. Was unclear why patient was not on chronic anticoagulation. Clot found in right common femoral, femoral and popliteal and left common femoral and femoral veins. Changed over to by mouth Apixaban on 5/12.  Dysphagia: Patient seen by speech therapy who initially recommended full liquids prior to modified barium. After seen by speech, they recommend dysphagia 2 diet.     Acute renal failure superimposed on stage 3 chronic kidney  disease ([place])   Acute blood loss anemia: Following admission, patient had 4 g drop in hemoglobin  over the past 7 days. Fecal occult blood negative. Seen by GI who recommended twice a day PPI. CT of abdomen and pelvis noted no evidence of retroperitoneal bleed. Normal LDH and haptoglobin so not felt to be hemolysis. Following hemoglobin.    Uncontrolled type 2 diabetes mellitus with hyperglycemia, without long-term current use of insulin ([name])  Hypomagnesemia/hypokalemia: Replacing.  Code Status: Full code   Family Communication: Left message with son  Disposition Plan: Skilled nursing    Consultants:  Orthopedic surgery  Infectious disease   Cardiology  Nephrology  Gastroenterology  Procedures:   Echocardiogram done 5/4: Moderate aortic stenosis. Pacemaker in place  Right knee aspiration 5  Attempted TEE  lower extremity Dopplers done 5/9: Bilateral DVT noted  Antimicrobials:   Vancomycin 5/2-5/6  Nafcillin 5/3-5/7  Unasyn 5/7-present   DVT prophylaxis:  On Apixaban   Objective: Vitals:   06/30/16 0633 06/30/16 1500 06/30/16 2202 07/01/16 0355  BP: (!) 131/58 (!) 126/52 112/79 124/77  Pulse: 70 68 72 68  Resp: _0 Temp: 97.2 F (36.2 C) 98.1 F (36.7 C) 97.9 F (36.6 C) 97.9 F (36.6 C)  TempSrc: Oral Oral Oral Oral  SpO2: 99% 96% 98% 100%  Weight:      Height:        Intake/Output Summary (Last 24 hours) at 07/01/16 1449 Last data filed at 07/01/16 1037  Gross per 24 hour  Intake             1160 ml  Output             2784 ml  Net            -1624 ml   Filed Weights   06/18/16 0056 06/20/16 0527 06/23/16 0529  Weight: 90.1 kg (198 lb 10.2 oz) 93.7 kg (206 lb 9.1 oz) 96.3 kg (212 lb 4.9 oz)    Exam:   General:  Alert and oriented 2, no acute distress   Cardiovascular: regular rate and rhythm, S1-S2   Respiratory: clear to auscultation bilaterally   Abdomen: soft, nontender, nondistended, hypoactive bowel sounds   Musculoskeletal: trace pitting edema   Skin: stage II decubitus wound  Psychiatry: patient with  some mild underlying dementia, otherwise no evidence of acute psychoses    Data Reviewed: CBC:  Recent Labs Lab 06/26/16 0409  06/27/16 0542 06/27/16 1158 06/28/16 0338 06/29/16 0420 06/30/16 0421  WBC 13.8*  < > 15.1* 16.0* 17.1* 17.1* 16.5*  NEUTROABS 8.4*  --   --   --  12.2*  --   --   HGB 8.6*  < > 8.0* 7.7* 7.9* 7.6* 7.5*  HCT 25.7*  < > 24.1* 23.5* 23.7* 23.7* 22.9*  MCV 84.5  < > 84.3 84.5 85.6 87.1 87.7  PLT 527*  < > 507* 523* 556* 563* 575*  < > = values in this interval not displayed. Basic Metabolic Panel:  Recent Labs Lab 06/25/16 0435 06/26/16 0409 06/27/16 0546 06/28/16 0338 06/29/16 0420 06/30/16 0421  NA 138 139 138 138 139 139  K 3.4* 3.5 3.5 3.5 3.4* 3.4*  CL 110 108 107 108 107 107  CO2 _1 GLUCOSE 140* 202* 224* 218* 162* 121*  BUN _2 CREATININE 1.71* 1.80* 1.89* 2.04* 2.18* 2.20*  CALCIUM 7.7* 7.9* 7.9* 7.9* 8.2* 8.3*  MG 1.8  --   --  1.9  --   --    GFR: Estimated Creatinine Clearance: 26.4 mL/min (A) (by C-G formula based on SCr of 2.2 mg/dL (H)). Liver Function Tests:  Recent Labs Lab 06/27/16 0546  AST 14*  ALT 6*  ALKPHOS 52  BILITOT 1.1  PROT 5.8*  ALBUMIN 1.9*   No results for input(s): LIPASE, AMYLASE in the last 168 hours. No results for input(s): AMMONIA in the last 168 hours. Coagulation Profile: No results for input(s): INR, PROTIME in the last 168 hours. Cardiac Enzymes: No results for input(s): CKTOTAL, CKMB, CKMBINDEX, TROPONINI in the last 168 hours. BNP (last 3 results) No results for input(s): PROBNP in the last 8760 hours. HbA1C: No results for input(s): HGBA1C in the last 72 hours. CBG:  Recent Labs Lab 06/30/16 1146 06/30/16 1650 06/30/16 2207 07/01/16 0732 07/01/16 1146  GLUCAP 194* 201* 186* 136* 158*   Lipid Profile: No results for input(s): CHOL, HDL, LDLCALC, TRIG, CHOLHDL, LDLDIRECT in the last 72 hours. Thyroid Function Tests: No results for input(s): TSH,  T4TOTAL, FREET4, T3FREE, THYROIDAB in the last 72 hours. Anemia Panel: No results for input(s): VITAMINB12, FOLATE, FERRITIN, TIBC, IRON, RETICCTPCT in the last 72 hours. Urine analysis:    Component Value Date/Time   COLORURINE STRAW (A) 06/27/2016 1541   APPEARANCEUR CLEAR 06/27/2016 1541   LABSPEC 1.008 06/27/2016 1541   PHURINE 6.0 06/27/2016 1541   GLUCOSEU 150 (A) 06/27/2016 1541   HGBUR NEGATIVE 06/27/2016 1541   BILIRUBINUR NEGATIVE 06/27/2016 1541   KETONESUR NEGATIVE 06/27/2016 1541   PROTEINUR NEGATIVE 06/27/2016 1541   UROBILINOGEN 0.2 05/18/2014 1634   NITRITE NEGATIVE 06/27/2016 1541   LEUKOCYTESUR NEGATIVE 06/27/2016 1541   Sepsis Labs: _0 (procalcitonin:4,lacticidven:4)  ) Recent Results (from the past 240 hour(s))  C difficile quick scan w PCR reflex     Status: None   Collection Time: 06/26/16 11:47 AM  Result Value Ref Range Status   C Diff antigen NEGATIVE NEGATIVE Final   C Diff toxin NEGATIVE NEGATIVE Final   C Diff interpretation No C. difficile detected.  Final  Culture, Urine     Status: None   Collection Time: 06/27/16  3:41 PM  Result Value Ref Range Status   Specimen Description URINE, CLEAN CATCH  Final   Special Requests NONE  Final   Culture   Final    NO GROWTH Performed at [facility], [address] [place], [place] [address]    Report Status 06/28/2016 FINAL  Final  Culture, blood (Routine X 2) w Reflex to ID Panel     Status: None (Preliminary result)   Collection Time: 06/27/16  5:10 PM  Result Value Ref Range Status   Specimen Description BLOOD LEFT HAND  Final   Special Requests   Final    BOTTLES DRAWN AEROBIC AND ANAEROBIC Blood Culture adequate volume   Culture   Final    NO GROWTH 2 DAYS Performed at [facility], [place] [address], [place], [place] [address]    Report Status PENDING  Incomplete  Culture, blood (Routine X 2) w Reflex to ID Panel     Status: None (Preliminary result)   Collection Time:  06/27/16  6:46 PM  Result Value Ref Range Status   Specimen Description BLOOD LEFT FOREARM  Final   Special Requests IN PEDIATRIC BOTTLE Blood Culture adequate volume  Final   Culture   Final    NO GROWTH 2 DAYS Performed at [facility], [place]  [address] [place], [place] [address]    Report Status PENDING  Incomplete      Studies: No results found.  Scheduled Meds: . apixaban  10 mg Oral BID   Followed by  . [START ON 07/04/2016] apixaban  5 mg Oral BID  . chlorhexidine  15 mL Mouth Rinse BID  . feeding supplement  1 Container Oral BID BM  . furosemide  40 mg Intravenous BID  . Gerhardt's butt cream   Topical TID  . insulin aspart  0-20 Units Subcutaneous TID WC  . insulin aspart  0-5 Units Subcutaneous QHS  . insulin aspart  10 Units Subcutaneous TID WC  . insulin detemir  10 Units Subcutaneous BID  . mouth rinse  15 mL Mouth Rinse q12n4p  . pantoprazole  40 mg Oral BID AC  . potassium chloride  40 mEq Oral Daily  . sodium chloride flush  10-40 mL Intracatheter Q12H    Continuous Infusions: . sodium chloride 10 mL/hr at 06/28/16 2300  . ampicillin-sulbactam (UNASYN) IV Stopped (07/01/16 0645)     LOS: 14 days    Annita Brod, MD [facility] Pager [phone]  If 7PM-7AM, please contact night-coverage [email or web address] Password [facility] 07/01/2016, 2:49 PM

## 2016-07-01 NOTE — Progress Notes (Signed)
PATIENT ID: Randy [name]  MRN: [id number]  DOB/AGE:  [birth year] / 83 y.o.  9 Days Post-Op Procedure(s): aborted TEE    PROGRESS NOTE Subjective:   Patient is alert, oriented, no Nausea, no Vomiting, yes passing gas. Taking PO but with continued swallowing issues. Denies SOB, Chest or Calf Pain. Using [employer]. Ambulate  - pt is non ambulatory, Patient reports pain as 1 on 0-10 scale,     Objective: Vital signs in last 24 hours: Temp:  [97.9 F (36.6 C)-98.1 F (36.7 C)] 97.9 F (36.6 C) (05/16 0355) Pulse Rate:  [68-72] 68 (05/16 0355) Resp:  [16-18] 16 (05/16 0355) BP: (112-126)/(52-79) 124/77 (05/16 0355) SpO2:  [96 %-100 %] 100 % (05/16 0355)    Intake/Output from previous day: I/O last 3 completed shifts: In: 1331.2 [P.O.:720; I.V.:411.2; IV Piggyback:200] Out: 2559 [Urine:2557; Stool:2]   Intake/Output this shift: No intake/output data recorded.   LABORATORY DATA:  Recent Labs  06/29/16 0420  06/30/16 0421  06/30/16 1650 06/30/16 2207 07/01/16 0732  WBC 17.1*  --  16.5*  --   --   --   --   HGB 7.6*  --  7.5*  --   --   --   --   HCT 23.7*  --  22.9*  --   --   --   --   PLT 563*  --  575*  --   --   --   --   NA 139  --  139  --   --   --   --   K 3.4*  --  3.4*  --   --   --   --   CL 107  --  107  --   --   --   --   CO2 24  --  24  --   --   --   --   BUN 10  --  11  --   --   --   --   CREATININE 2.18*  --  2.20*  --   --   --   --   GLUCOSE 162*  --  121*  --   --   --   --   GLUCAP  --   < >  --   < > 201* 186* 136*  CALCIUM 8.2*  --  8.3*  --   --   --   --   < > = values in this interval not displayed.  Examination: Neurologically intact Neurovascular intact Sensation intact distally Dorsiflexion/Plantar flexion intact} Pt continues with significant lower extremity edema.  Knee is minimally painful.  Small fluid collection. Assessment:   83 year old man with significant and severe comorbidities including bilateral deep DVTs in the  femoral veins of both legs, dementia, history of sepsis last week that seeded his revision right total knee. Any surgery on this patient in and of itself would be life-threatening, and I am not sure he would heal the wound.  If he survived.  Plan: Continue close monitoring of the right total knee.  The fluid re-accumulates, we will again aspirate.  Continue suppressive antibiotics.  If pt discharges to SNF, he will need to follow up within 5-7 days in office with Dr. Mayer Camel.     Tico Crotteau R 07/01/2016, 7:58 AM

## 2016-07-01 NOTE — Progress Notes (Signed)
[facility] Progress Note   Subjective: no lab today, UOP 1.8 L  Yest. Pt in good spirits  Vitals:   06/30/16 2202 07/01/16 0355 07/01/16 1500 07/01/16 1600  BP: 112/79 124/77 122/77   Pulse: 72 68 72   Resp: 18 16 16    Temp: 97.9 F (36.6 C) 97.9 F (36.6 C) 97.9 F (36.6 C)   TempSrc: Oral Oral Oral   SpO2: 98% 100% 98%   Weight:    88 kg (194 lb)  Height:        Inpatient medications: . apixaban  10 mg Oral BID   Followed by  . [START ON 07/04/2016] apixaban  5 mg Oral BID  . chlorhexidine  15 mL Mouth Rinse BID  . feeding supplement  1 Container Oral BID BM  . furosemide  40 mg Intravenous BID  . Gerhardt's butt cream   Topical TID  . insulin aspart  0-20 Units Subcutaneous TID WC  . insulin aspart  0-5 Units Subcutaneous QHS  . insulin aspart  10 Units Subcutaneous TID WC  . insulin detemir  10 Units Subcutaneous BID  . mouth rinse  15 mL Mouth Rinse q12n4p  . pantoprazole  40 mg Oral BID AC  . potassium chloride  40 mEq Oral Daily  . sodium chloride flush  10-40 mL Intracatheter Q12H   . sodium chloride 10 mL/hr at 06/28/16 2300  . ampicillin-sulbactam (UNASYN) IV Stopped (07/01/16 0645)   hydrALAZINE, sodium chloride flush  Exam: Gen chron ill appearing, no distress, cheerful No jvd or bruits Chest clear bilat RRR no MRG Abd soft ntnd no mass or ascites +bs GU normal male w condom cath draining clear yellowish urine MS R knee tender, mildly swollen Ext 1-2+ RLE edema, 1-2+ pannus / abd wall edema bilat Neuro is alert, Ox 3 ,gen'd weakness, poor movement of LE's  IP Antibiotics >  Diflucan = dc'd on 5/10 Nystatin powder dc'd on 5/9 Vanc = dc'd on 5/7 Nafcillin = dc'd on 5/7 Unasyn 5/7 > current  UA 5/12 > clear, no bact, glu 150 prot neg, rbc / wbc 0-5  Renal US May 13 >  - Right Kidney 10.3 cm. 8.5 cm cyst. Mild cortical thinning - Left Kidney: Length: 10.1 cm. Echogenicity within normal limits. No mass or hydronephrosis  visualized.  I/O since admission 5/2 > 23 L in and 16 L out (+7 L) Wts = 81 kg on admission 5/2 and 96kg on 5/8 (last wt)  Assessment: 1  Acute on CKD - baseline creat 1.3 in March, AKI in setting of acute R knee infection w bacteremia. Not surg candidate per ortho. Creat is possibly stabilizing.  On IV abx.  Repeat Cr in am.  2  Septic R knee prosthesis - not surg candidate, on IV abx for enterococcus/ MSSA bacteremia 3  Vol overload - wt's down 88kg, still up 7kg from admission and still +edema 4  Hx PPM 5  HTN bp's stable 6  Hx MI   Plan - cont lasix IV, f/u creat in am   Kelly Splinter MD [place] pager [phone]   07/01/2016, 4:23 PM    Recent Labs Lab 06/28/16 0338 06/29/16 0420 06/30/16 0421  NA 138 139 139  K 3.5 3.4* 3.4*  CL 108 107 107  CO2 23 24 24   GLUCOSE 218* 162* 121*  BUN 10 10 11   CREATININE 2.04* 2.18* 2.20*  CALCIUM 7.9* 8.2* 8.3*    Recent Labs Lab 06/27/16 0546  AST 14*  ALT 6*  ALKPHOS 52  BILITOT 1.1  PROT 5.8*  ALBUMIN 1.9*    Recent Labs Lab 06/26/16 0409  06/28/16 0338 06/29/16 0420 06/30/16 0421  WBC 13.8*  < > 17.1* 17.1* 16.5*  NEUTROABS 8.4*  --  12.2*  --   --   HGB 8.6*  < > 7.9* 7.6* 7.5*  HCT 25.7*  < > 23.7* 23.7* 22.9*  MCV 84.5  < > 85.6 87.1 87.7  PLT 527*  < > 556* 563* 575*  < > = values in this interval not displayed. Iron/TIBC/Ferritin/ %Sat    Component Value Date/Time   IRON 78 06/27/2016 0546   TIBC 161 (L) 06/27/2016 0546   FERRITIN 896 (H) 06/27/2016 0546   IRONPCTSAT 48 (H) 06/27/2016 8677

## 2016-07-02 LAB — BASIC METABOLIC PANEL
Anion gap: 12 (ref 5–15)
BUN: 20 mg/dL (ref 6–20)
CALCIUM: 8.5 mg/dL — AB (ref 8.9–10.3)
CO2: 23 mmol/L (ref 22–32)
CREATININE: 2.48 mg/dL — AB (ref 0.61–1.24)
Chloride: 100 mmol/L — ABNORMAL LOW (ref 101–111)
GFR calc Af Amer: 25 mL/min — ABNORMAL LOW (ref >60)
GFR, EST NON AFRICAN AMERICAN: 21 mL/min — AB (ref >60)
GLUCOSE: 117 mg/dL — AB (ref 65–99)
Potassium: 4 mmol/L (ref 3.5–5.1)
Sodium: 135 mmol/L (ref 135–145)

## 2016-07-02 LAB — CBC
HCT: 25.2 % — ABNORMAL LOW (ref 39.0–52.0)
Hemoglobin: 8.1 g/dL — ABNORMAL LOW (ref 13.0–17.0)
MCH: 28.6 pg (ref 26.0–34.0)
MCHC: 32.1 g/dL (ref 30.0–36.0)
MCV: 89 fL (ref 78.0–100.0)
PLATELETS: 523 10*3/uL — AB (ref 150–400)
RBC: 2.83 MIL/uL — ABNORMAL LOW (ref 4.22–5.81)
RDW: 22.3 % — ABNORMAL HIGH (ref 11.5–15.5)
WBC: 11.2 10*3/uL — ABNORMAL HIGH (ref 4.0–10.5)

## 2016-07-02 LAB — GLUCOSE, CAPILLARY
GLUCOSE-CAPILLARY: 117 mg/dL — AB (ref 65–99)
GLUCOSE-CAPILLARY: 268 mg/dL — AB (ref 65–99)
GLUCOSE-CAPILLARY: 141 mg/dL — AB (ref 65–99)
Glucose-Capillary: 142 mg/dL — ABNORMAL HIGH (ref 65–99)

## 2016-07-02 MED ORDER — HEPARIN (PORCINE) IN NACL 100-0.45 UNIT/ML-% IJ SOLN
1600.0000 [IU]/h | INTRAMUSCULAR | Status: DC
  Administered 2016-07-02: 1600 [IU]/h via INTRAVENOUS
  Filled 2016-07-02: qty 250

## 2016-07-02 MED ORDER — HEPARIN (PORCINE) IN NACL 100-0.45 UNIT/ML-% IJ SOLN: 1600.0000 [IU]/h | INTRAMUSCULAR | Status: DC

## 2016-07-02 NOTE — Progress Notes (Signed)
Pharmacy Antibiotic Note  Randy [name] is a 83 y.o. male with pacemaker and prosthetic knee admitted on 06/17/2016 with sepsis. ID narrowing antibiotic therapy today from Nafcillin and Vancomyin to Unasyn for right prosthetic joint infection and bacteremia due to MSSA, CoNS, and ampicillin sensitive Enterococcus. TTE negative for vegetations. Unable to perform TEE.  Not surgical candidate for AKA.  ID recommends to treat with 6 weeks of IV abx (thru 07/30/16) and then transition to augmentin indefinitely for suppression.  - PICC placed on 06/23/16  Today, 07/02/16:  - day #15 of 42 of abx - afeb - WBC remains slightly increased, improving - SCr increasing (nephrology following)  Plan: - For CrCl < 30 ml.min, Continue Unasyn to 3g IV q12h. - Monitor renal function closely  _________________________  Height: 5\' 11"  (180.3 cm) Weight: 191 lb (86.6 kg) IBW/kg (Calculated) : 75.3  Temp (24hrs), Avg:97.9 F (36.6 C), Min:97.5 F (36.4 C), Max:98.3 F (36.8 C)   Recent Labs Lab 06/27/16 0546 06/27/16 1158 06/28/16 0338 06/29/16 0420 06/30/16 0421 07/02/16 0500  WBC  --  16.0* 17.1* 17.1* 16.5* 11.2*  CREATININE 1.89*  --  2.04* 2.18* 2.20* 2.48*    Estimated Creatinine Clearance: 21.1 mL/min (A) (by C-G formula based on SCr of 2.48 mg/dL (H)).    No Known Allergies  Antimicrobials this admission:  5/2 vanc >> 5/7 5/2 zosyn >> 5/3 5/3 naficillin >> 5/7 5/3 fluconazole >> 5/10 5/7 unasyn >> (6/15)   Dose adjustments this admission:  5/5 2130 VT: 14 mg/L, continue Vanc 1 Gm IV q24h f/u scr daily  Microbiology results:  5/2 BCx: 1/2 with MSSA, Enterococcus faecalis (ampicillin sensitive); 1/2 with CoNS FINAL 5/2 influenza PCR: neg 5/2 strep/legionella ur ag: neg 5/3 MRSA PCR: positive 5/3 Trach aspirate: c/w normal respiratory flora 5/3 BCx: 1/2 Staph aureus (BCID = MSSA) FINAL 5/4 synovial fluid from R knee: moderate MSSA FINAL 5/4 BCx x2: neg FINAL   Thank you for  allowing pharmacy to be a part of this patient's care.  Doreene Eland, PharmD, BCPS.   Pager: [phone] 07/02/2016 11:31 AM

## 2016-07-02 NOTE — Progress Notes (Addendum)
[place] for apixaban to heparin gtt Indication: DVT  No Known Allergies  Patient Measurements: Height: 5\' 11"  (180.3 cm) Weight: 191 lb (86.6 kg) IBW/kg (Calculated) : 75.3 Heparin Dosing Weight: 87 kg  Vital Signs: Temp: 97.5 F (36.4 C) (05/17 0540) Temp Source: Oral (05/17 0540) BP: 136/58 (05/17 0540) Pulse Rate: 70 (05/17 0540)  Labs:  Recent Labs  06/30/16 0421 07/02/16 0500  HGB 7.5* 8.1*  HCT 22.9* 25.2*  PLT 575* 523*  CREATININE 2.20* 2.48*    Estimated Creatinine Clearance: 21.1 mL/min (A) (by C-G formula based on SCr of 2.48 mg/dL (H)).   Medical History: Past Medical History:  Diagnosis Date  . Anxiety   . Arthritis    R knee,   . CKD (chronic kidney disease)   . Coronary artery disease   . Diabetes mellitus without complication ([place])   . Dysrhythmia    PAF;,sss s/p St. Jude PPM  . Hyperlipidemia   . Hypertension   . MI (myocardial infarction) ([place]) 2014  . Presence of permanent cardiac pacemaker     Assessment: 59 yoM admitted for sepsis secondary to MSSA/Enterococcus bacteremia, recurrent right prosthetic knee infection, groin cellulitis.  Bilateral lower extremity venous duplex completed today shows bilateral acute DVTs. Pt was initially on prophylactic enoxaparin then heparin to apixaban 5/12,   Today, 07/02/2016: - CBC: Hgb - decreased but improved overnight, pltc elevated - Renal: SCr trending up  - no dose adjustment recommended in renal impairment in treatment of VTE. However pts with Scr > 2.5 mg/dL or CrCl < 87ml/min were excluded from the clinical trials.   - s/p knee aspiration 5/12 - abd CT on 5/11 neg for bleed -- GI added PPI and signed off  ADDENDUM  Following discussion with Dr. [place] Pink will stop apixaban with worsening renal function and re-institute heparin gtt.  Patient previously on heparin 1600 units/hr with therapeutic trough.   Last dose on eliquis 5/17 at 0830  Baseline  aPTT = 34 sec on 5/3 (will not recheck baseline aPTT or heparin level)  Goal of Therapy:  Heparin level 0.3-0.7 APTT 66-102 sec  Plan:   Stop apixaban  Start heparin 1600 units/hr at 22:00 (time next eliquis dose would have been due)  Check heparin level and aPTT in 8h  Daily heparin level and CBC  If renal function improves, plan is to resume apixaban.  If does not improve then warfarin likely best option  Doreene Eland, PharmD, BCPS.   Pager: [phone] 07/02/2016 11:33 AM

## 2016-07-02 NOTE — Progress Notes (Signed)
[facility] Progress Note   Subjective: 2.6 L UOP on IV lasix yest.  Creat up 2.4 today.  No new pt c/o's.  Wts down from 93 > 86kg, on admit was 82kg.    Vitals:   07/01/16 2134 07/02/16 0500 07/02/16 0540 07/02/16 1453  BP: (!) 117/55  (!) 136/58 (!) 124/57  Pulse: 80  70 72  Resp:   18 16  Temp: 98.3 F (36.8 C)  97.5 F (36.4 C) 97.7 F (36.5 C)  TempSrc: Oral  Oral Oral  SpO2: 96%  98% 100%  Weight:  86.6 kg (191 lb)    Height:        Inpatient medications: . chlorhexidine  15 mL Mouth Rinse BID  . feeding supplement  1 Container Oral BID BM  . Gerhardt's butt cream   Topical TID  . insulin aspart  0-20 Units Subcutaneous TID WC  . insulin aspart  0-5 Units Subcutaneous QHS  . insulin aspart  10 Units Subcutaneous TID WC  . insulin detemir  10 Units Subcutaneous BID  . mouth rinse  15 mL Mouth Rinse q12n4p  . pantoprazole  40 mg Oral BID AC  . potassium chloride  40 mEq Oral Daily  . sodium chloride flush  10-40 mL Intracatheter Q12H   . sodium chloride 10 mL/hr at 06/28/16 2300  . ampicillin-sulbactam (UNASYN) IV Stopped (07/02/16 7939)  . heparin     hydrALAZINE, sodium chloride flush  Exam: Gen chron ill appearing, no distress No jvd or bruits Chest clear bilat RRR no MRG Abd soft ntnd no mass or ascites +bs GU normal male w condom cath draining clear yellowish urine MS R knee tender, mildly swollen Ext 1+ RLE edema, 1+ pannus / abd wall edema Neuro is alert, Ox 3 ,gen'd weakness, poor movement of LE's  IP Antibiotics >  Diflucan = dc'd on 5/10 Nystatin powder dc'd on 5/9 Vanc = dc'd on 5/7 Nafcillin = dc'd on 5/7 Unasyn 5/7 > current  UA 5/12 > clear, no bact, glu 150 prot neg, rbc / wbc 0-5  Renal US May 13 >  - Right Kidney 10.3 cm. 8.5 cm cyst. Mild cortical thinning - Left Kidney: Length: 10.1 cm. Echogenicity within normal limits. No mass or hydronephrosis visualized.   Assessment: 1  Acute on CKD - baseline creat  1.3 in March, AKI in setting of acute R knee infection w bacteremia. No specific renal toxins noted, no hypotension. Unclear cause. UA benigh, US unremarkable. Creat slightly up >> have been diuresing but now will hold off on further diuretics, and will see if creat will come down. Not HD candidate.  2  Septic R knee prosthesis - not surg candidate, on IV abx for enterococcus/ MSSA bacteremia 3  Vol overload - improved, now only 4kg up (14kg up at peak) 4  Hx PPM 5  HTN bp's stable 6  Hx MI   Plan - dc lasix, f/u creat am.    Kelly Splinter MD [facility] pager [phone]   07/02/2016, 5:23 PM    Recent Labs Lab 06/29/16 0420 06/30/16 0421 07/02/16 0500  NA 139 139 135  K 3.4* 3.4* 4.0  CL 107 107 100*  CO2 24 24 23   GLUCOSE 162* 121* 117*  BUN 10 11 20   CREATININE 2.18* 2.20* 2.48*  CALCIUM 8.2* 8.3* 8.5*    Recent Labs Lab 06/27/16 0546  AST 14*  ALT 6*  ALKPHOS 52  BILITOT 1.1  PROT 5.8*  ALBUMIN 1.9*    Recent Labs Lab 06/26/16 0409  06/28/16 0338 06/29/16 0420 06/30/16 0421 07/02/16 0500  WBC 13.8*  < > 17.1* 17.1* 16.5* 11.2*  NEUTROABS 8.4*  --  12.2*  --   --   --   HGB 8.6*  < > 7.9* 7.6* 7.5* 8.1*  HCT 25.7*  < > 23.7* 23.7* 22.9* 25.2*  MCV 84.5  < > 85.6 87.1 87.7 89.0  PLT 527*  < > 556* 563* 575* 523*  < > = values in this interval not displayed. Iron/TIBC/Ferritin/ %Sat    Component Value Date/Time   IRON 78 06/27/2016 0546   TIBC 161 (L) 06/27/2016 0546   FERRITIN 896 (H) 06/27/2016 0546   IRONPCTSAT 48 (H) 06/27/2016 4935

## 2016-07-02 NOTE — Progress Notes (Signed)
  Speech Language Pathology Treatment: Dysphagia  Patient Details Name: Randy [name] MRN: [id number] DOB: 12-Nov-1924 Today's Date: 07/02/2016 Time: 3953-2023 SLP Time Calculation (min) (ACUTE ONLY): 25 min  Assessment / Plan / Recommendation Clinical Impression  Pt seen at bedside during lunch (Dys 2, thin liquid). Pt reports safe swallow precautions include "being careful" and "taking my time". SLP reviewed posted precautions and reiterated rationale for them. No overt s/s aspiration observed during lunch, however, pt did report globus sensation intermittently. Pt required cues to remember to alternate solids and liquids. Per RN, pt likely going to SNF. Recommend continued ST intervention at next level of care.    HPI HPI: 83 year old male admitted to [facility] with AMS, cough- found to be bacterial septic.  PMH + for DM, MI, no CVA hx. CT chest and CXR negative.  Swallow eval ordered due to pt having dysphagia.       SLP Plan  Continue with current plan of care    Recommendations  Diet recommendations: Dysphagia 2 (fine chop);Thin liquid Liquids provided via: Cup;Straw Medication Administration: Crushed with puree Supervision: Full supervision/cueing for compensatory strategies Compensations: Slow rate;Small sips/bites;Multiple dry swallows after each bite/sip;Follow solids with liquid;Minimize environmental distractions Postural Changes and/or Swallow Maneuvers: Seated upright 90 degrees;Upright 30-60 min after meal               Oral Care Recommendations: Oral care BID SLP Visit Diagnosis: Dysphagia, oropharyngeal phase (R13.12) Plan: Continue with current plan of care       Solei Wubben B. Quentin Ore [facility], [place]  Shonna Chock 07/02/2016, 12:21 PM

## 2016-07-02 NOTE — Progress Notes (Signed)
Per Jackelyn Poling at [facility] in [place], [place], facility can offer pt bed. Jackelyn Poling asks CSW to contact facility when pt stable for DC. Report she is reaching out to son re: admission paperwork needs.  CSW will continue following to assist with DC.  Sharren Bridge, MSW, LCSW Clinical Social Work 07/02/2016 [phone]

## 2016-07-02 NOTE — Progress Notes (Signed)
PROGRESS NOTE  Randy [name] INO:[id number] DOB: 1924/12/05 DOA: 06/17/2016 PCP: System, Pcp Not In  HPI/Recap of past 24 hours:  83 y.o.gentleman with a history of mild dementia/cognitive impairment, CAD with prior stent placement, history of atrial fibrillation S/P PPM implant (CHADS-Vasc score of at least four, no anticoagulation), HTN, HLD, DM, and CKD 3 with baseline creatinine around 1.3-1.5 who was referred to the ED via EMS by one of his caregivers for evaluation of increased lethargy, mental status changes, and productive cough with yellowish sputum 4-5 days.. Patient at baseline nonambulatory and spends most of time in recliner and requires caregiver support 24 hours a day.   On 06/17/16, he was noted to have increased lethargy and muffled speech with slurring. No other focal weakenss. 911 was called and the patient was transferred to the ED for evaluation. Further workup revealed bacteremia with MSSA and enterococcus faecalis. ID was consulted to assist with management of antibiotics. The patient was also found to have a right prosthetic knee infection as well as infection of his permanent pacemaker. He was deemed to be a poor candidate for removal of his pacemaker as well as AKA.  Therefore decision was made to finish 6 weeks of intravenous antibiotics and then lifelong suppression with amox/clav. Although there was initial improvement, the patient's serum creatinine has gradually risen. As result, nephrology was consulted to assist.  Over the last few days, patient treated with IV Lasix given volume overload. Creatinine with some mild increase although has been diuresing well.  Patient himself with no complaints. No abdominal pain, no shortness of breath or soreness.  Assessment/Plan: Principal Problem:   Sepsis ([place]) secondary to MSSA/enterococcus with MSSA right prosthetic knee infection and pacemaker: Patient met criteria on admission given lactic acidosis elevated pro calcitonin,  fever, tachycardia, tachypnea and leukocytosis with source from knee as well as pacemaker. Chest x-ray and urinalysis unremarkable. Strep pneumo and influenza PCR also negative. But cultures on 5/2 grew out positive for MSSA and enterococcus. Sepsis and felt felt to be stable. Infectious disease consulted to assist with antibiotic management. Patient felt to be poor candidate for removal of pacemaker as well as AKA, therefore decision made to go with 6 weeks of IV antibiotics and then lifelong suppression with Augmentin. Active Problems:   Hypertension: Stable   Diabetes mellitus ([place])   Acute kidney injury in the setting of Chronic kidney disease (CKD), stage III (moderate): Patient's serum creatinine rose following improvement from IV hydration. Initially felt to be secondary to sepsis. Nephrology consulted. Now felt to be secondary to perfusion. Seen by nephrology and IV Lasix restarted. Creatinine trending up slightly over last few days  Chronic atrial fibrillation: Chads 2 score of 5. Previous and not on anticoagulation. Unclear why previously not on anticoagulation. Started on here following discovery of DVT bilaterally. Status post pacemaker placement. At this time, no plans for device extraction given high mortality despite bacteremia     Stage II Pressure injury of skin: Which related. Present on admission. Seen by wound care. No signs of infection.  Moderate aortic stenosis: Noted on echocardiogram. Watching input and output. Still up 4+ L    Late onset Alzheimer's disease without cognitive impairment: Waxes and wanes. Stable.    Leg DVT (deep venous thromboembolism), acute, bilateral ([place]): Patient is a 2 or 3 previous episodes of DVTs in the past. Started on IV heparin. Was unclear why patient was not on chronic anticoagulation. Clot found in right common femoral, femoral and popliteal and  left common femoral and femoral veins. Changed over to by mouth Apixaban on 5/12.  Dysphagia:  Patient seen by speech therapy who initially recommended full liquids prior to modified barium. After seen by speech, they recommend dysphagia 2 diet.     Acute renal failure superimposed on stage 3 chronic kidney disease ([name])   Acute blood loss anemia: Following admission, patient had 4 g drop in hemoglobin over the past 7 days. Fecal occult blood negative. Seen by GI who recommended twice a day PPI. CT of abdomen and pelvis noted no evidence of retroperitoneal bleed. Normal LDH and haptoglobin so not felt to be hemolysis. Following hemoglobin.    Uncontrolled type 2 diabetes mellitus with hyperglycemia, without long-term current use of insulin ([place]): Latest CBG greater than 250 although once prior over the last few days had been under 200. Continue to watch  Hypomagnesemia/hypokalemia: Replacing.  Code Status: Full code   Family Communication: Left message with son  Disposition Plan: Skilled nursing    Consultants:  Orthopedic surgery  Infectious disease   Cardiology  Nephrology  Gastroenterology  Procedures:   Echocardiogram done 5/4: Moderate aortic stenosis. Pacemaker in place  Right knee aspiration 5  Attempted TEE  lower extremity Dopplers done 5/9: Bilateral DVT noted  Antimicrobials:   Vancomycin 5/2-5/6  Nafcillin 5/3-5/7  Unasyn 5/7-present   DVT prophylaxis:  On Apixaban   Objective: Vitals:   07/01/16 1600 07/01/16 2134 07/02/16 0500 07/02/16 0540  BP:  (!) 117/55  (!) 136/58  Pulse:  80  70  Resp:    18  Temp:  98.3 F (36.8 C)  97.5 F (36.4 C)  TempSrc:  Oral  Oral  SpO2:  96%  98%  Weight: 88 kg (194 lb)  86.6 kg (191 lb)   Height:        Intake/Output Summary (Last 24 hours) at 07/02/16 1328 Last data filed at 07/02/16 0900  Gross per 24 hour  Intake              950 ml  Output             1730 ml  Net             -780 ml   Filed Weights   06/23/16 0529 07/01/16 1600 07/02/16 0500  Weight: 96.3 kg (212 lb 4.9 oz) 88 kg (194  lb) 86.6 kg (191 lb)    Exam: Unchanged from previous day  General:  Alert and oriented 2, no acute distress   Cardiovascular: regular rate and rhythm, S1-S2   Respiratory: clear to auscultation bilaterally   Abdomen: soft, nontender, nondistended, hypoactive bowel sounds   Musculoskeletal: trace pitting edema   Skin: stage II decubitus wound  Psychiatry: patient with some mild underlying dementia, otherwise no evidence of acute psychoses    Data Reviewed: CBC:  Recent Labs Lab 06/26/16 0409  06/27/16 1158 06/28/16 0338 06/29/16 0420 06/30/16 0421 07/02/16 0500  WBC 13.8*  < > 16.0* 17.1* 17.1* 16.5* 11.2*  NEUTROABS 8.4*  --   --  12.2*  --   --   --   HGB 8.6*  < > 7.7* 7.9* 7.6* 7.5* 8.1*  HCT 25.7*  < > 23.5* 23.7* 23.7* 22.9* 25.2*  MCV 84.5  < > 84.5 85.6 87.1 87.7 89.0  PLT 527*  < > 523* 556* 563* 575* 523*  < > = values in this interval not displayed. Basic Metabolic Panel:  Recent Labs Lab 06/27/16 0546 06/28/16 5697 06/29/16 0420  06/30/16 0421 07/02/16 0500  NA 138 138 139 139 135  K 3.5 3.5 3.4* 3.4* 4.0  CL 107 108 107 107 100*  CO2 24 23 24 24 23  GLUCOSE 224* 218* 162* 121* 117*  BUN 11 10 10 11 20  CREATININE 1.89* 2.04* 2.18* 2.20* 2.48*  CALCIUM 7.9* 7.9* 8.2* 8.3* 8.5*  MG  --  1.9  --   --   --    GFR: Estimated Creatinine Clearance: 21.1 mL/min (A) (by C-G formula based on SCr of 2.48 mg/dL (H)). Liver Function Tests:  Recent Labs Lab 06/27/16 0546  AST 14*  ALT 6*  ALKPHOS 52  BILITOT 1.1  PROT 5.8*  ALBUMIN 1.9*   No results for input(s): LIPASE, AMYLASE in the last 168 hours. No results for input(s): AMMONIA in the last 168 hours. Coagulation Profile: No results for input(s): INR, PROTIME in the last 168 hours. Cardiac Enzymes: No results for input(s): CKTOTAL, CKMB, CKMBINDEX, TROPONINI in the last 168 hours. BNP (last 3 results) No results for input(s): PROBNP in the last 8760 hours. HbA1C: No results for  input(s): HGBA1C in the last 72 hours. CBG:  Recent Labs Lab 07/01/16 1146 07/01/16 1629 07/01/16 2132 07/02/16 0737 07/02/16 1129  GLUCAP 158* 150* 114* 117* 268*   Lipid Profile: No results for input(s): CHOL, HDL, LDLCALC, TRIG, CHOLHDL, LDLDIRECT in the last 72 hours. Thyroid Function Tests: No results for input(s): TSH, T4TOTAL, FREET4, T3FREE, THYROIDAB in the last 72 hours. Anemia Panel: No results for input(s): VITAMINB12, FOLATE, FERRITIN, TIBC, IRON, RETICCTPCT in the last 72 hours. Urine analysis:    Component Value Date/Time   COLORURINE STRAW (A) 06/27/2016 1541   APPEARANCEUR CLEAR 06/27/2016 1541   LABSPEC 1.008 06/27/2016 1541   PHURINE 6.0 06/27/2016 1541   GLUCOSEU 150 (A) 06/27/2016 1541   HGBUR NEGATIVE 06/27/2016 1541   BILIRUBINUR NEGATIVE 06/27/2016 1541   KETONESUR NEGATIVE 06/27/2016 1541   PROTEINUR NEGATIVE 06/27/2016 1541   UROBILINOGEN 0.2 05/18/2014 1634   NITRITE NEGATIVE 06/27/2016 1541   LEUKOCYTESUR NEGATIVE 06/27/2016 1541   Sepsis Labs: @LABRCNTIP(procalcitonin:4,lacticidven:4)  ) Recent Results (from the past 240 hour(s))  C difficile quick scan w PCR reflex     Status: None   Collection Time: 06/26/16 11:47 AM  Result Value Ref Range Status   C Diff antigen NEGATIVE NEGATIVE Final   C Diff toxin NEGATIVE NEGATIVE Final   C Diff interpretation No C. difficile detected.  Final  Culture, Urine     Status: None   Collection Time: 06/27/16  3:41 PM  Result Value Ref Range Status   Specimen Description URINE, CLEAN CATCH  Final   Special Requests NONE  Final   Culture   Final    NO GROWTH Performed at [facility], [address] [place], [place] [address]    Report Status 06/28/2016 FINAL  Final  Culture, blood (Routine X 2) w Reflex to ID Panel     Status: None (Preliminary result)   Collection Time: 06/27/16  5:10 PM  Result Value Ref Range Status   Specimen Description BLOOD LEFT HAND  Final   Special Requests    Final    BOTTLES DRAWN AEROBIC AND ANAEROBIC Blood Culture adequate volume   Culture   Final    NO GROWTH 4 DAYS Performed at [facility], [address] [place], [place] [address]    Report Status PENDING  Incomplete  Culture, blood (Routine X 2) w Reflex to   ID Panel     Status: None (Preliminary result)   Collection Time: 06/27/16  6:46 PM  Result Value Ref Range Status   Specimen Description BLOOD LEFT FOREARM  Final   Special Requests IN PEDIATRIC BOTTLE Blood Culture adequate volume  Final   Culture   Final    NO GROWTH 4 DAYS Performed at [facility], [address] [place], [place] [address]    Report Status PENDING  Incomplete      Studies: No results found.  Scheduled Meds: . apixaban  10 mg Oral BID   Followed by  . [START ON 07/04/2016] apixaban  5 mg Oral BID  . chlorhexidine  15 mL Mouth Rinse BID  . feeding supplement  1 Container Oral BID BM  . Gerhardt's butt cream   Topical TID  . insulin aspart  0-20 Units Subcutaneous TID WC  . insulin aspart  0-5 Units Subcutaneous QHS  . insulin aspart  10 Units Subcutaneous TID WC  . insulin detemir  10 Units Subcutaneous BID  . mouth rinse  15 mL Mouth Rinse q12n4p  . pantoprazole  40 mg Oral BID AC  . potassium chloride  40 mEq Oral Daily  . sodium chloride flush  10-40 mL Intracatheter Q12H    Continuous Infusions: . sodium chloride 10 mL/hr at 06/28/16 2300  . ampicillin-sulbactam (UNASYN) IV Stopped (07/02/16 4917)     LOS: 15 days    Annita Brod, MD [facility] Pager [phone]  If 7PM-7AM, please contact night-coverage [email or web address] Password [email or web address] 07/02/2016, 1:28 PM

## 2016-07-02 NOTE — Progress Notes (Signed)
CSW spoke with patient son and discussed discharge plans. Patient son wants patient to be placed at [facility] in [place], [place] close to family. He reports patient has been to facility in the past. Patient has been accepted to [facility]. Patient son understands and is agreeable to pay for [facility] transport, when patient is medically stable. CSW will continue to follow discharge needs.   Kathrin Greathouse, Latanya Presser, MSW Clinical Social Worker 5E and Psychiatric Service Line [phone] 07/02/2016  12:19 PM

## 2016-07-03 DIAGNOSIS — N179 Acute kidney failure, unspecified: Secondary | ICD-10-CM

## 2016-07-03 DIAGNOSIS — A4101 Sepsis due to Methicillin susceptible Staphylococcus aureus: Secondary | ICD-10-CM

## 2016-07-03 LAB — APTT: APTT: 98 s — AB (ref 24–36)

## 2016-07-03 LAB — HEPARIN LEVEL (UNFRACTIONATED): Heparin Unfractionated: >2.2 IU/mL — ABNORMAL HIGH (ref 0.30–0.70)

## 2016-07-03 LAB — CULTURE, BLOOD (ROUTINE X 2)
CULTURE: NO GROWTH
Culture: NO GROWTH
SPECIAL REQUESTS: ADEQUATE
Special Requests: ADEQUATE

## 2016-07-03 LAB — URINALYSIS, ROUTINE W REFLEX MICROSCOPIC
Bilirubin Urine: NEGATIVE
GLUCOSE, UA: NEGATIVE mg/dL
Hgb urine dipstick: NEGATIVE
Ketones, ur: NEGATIVE mg/dL
LEUKOCYTES UA: NEGATIVE
Nitrite: NEGATIVE
PROTEIN: NEGATIVE mg/dL
Specific Gravity, Urine: 1.011 (ref 1.005–1.030)
pH: 6 (ref 5.0–8.0)

## 2016-07-03 LAB — CBC
HCT: 25.3 % — ABNORMAL LOW (ref 39.0–52.0)
HEMOGLOBIN: 7.9 g/dL — AB (ref 13.0–17.0)
MCH: 28.2 pg (ref 26.0–34.0)
MCHC: 31.2 g/dL (ref 30.0–36.0)
MCV: 90.4 fL (ref 78.0–100.0)
Platelets: 580 10*3/uL — ABNORMAL HIGH (ref 150–400)
RBC: 2.8 MIL/uL — AB (ref 4.22–5.81)
RDW: 22.7 % — ABNORMAL HIGH (ref 11.5–15.5)
WBC: 10.1 10*3/uL (ref 4.0–10.5)

## 2016-07-03 LAB — BASIC METABOLIC PANEL
ANION GAP: 11 (ref 5–15)
BUN: 24 mg/dL — ABNORMAL HIGH (ref 6–20)
CALCIUM: 8.7 mg/dL — AB (ref 8.9–10.3)
CO2: 25 mmol/L (ref 22–32)
CREATININE: 2.75 mg/dL — AB (ref 0.61–1.24)
Chloride: 102 mmol/L (ref 101–111)
GFR calc non Af Amer: 19 mL/min — ABNORMAL LOW (ref >60)
GFR, EST AFRICAN AMERICAN: 22 mL/min — AB (ref >60)
GLUCOSE: 123 mg/dL — AB (ref 65–99)
Potassium: 4.1 mmol/L (ref 3.5–5.1)
Sodium: 138 mmol/L (ref 135–145)

## 2016-07-03 LAB — GLUCOSE, CAPILLARY
GLUCOSE-CAPILLARY: 283 mg/dL — AB (ref 65–99)
GLUCOSE-CAPILLARY: 165 mg/dL — AB (ref 65–99)
GLUCOSE-CAPILLARY: 98 mg/dL (ref 65–99)
Glucose-Capillary: 117 mg/dL — ABNORMAL HIGH (ref 65–99)

## 2016-07-03 MED ORDER — ENOXAPARIN SODIUM 100 MG/ML ~~LOC~~ SOLN
1.0000 mg/kg | SUBCUTANEOUS | Status: DC
Start: 2016-07-03 — End: 2016-07-06
  Administered 2016-07-03 – 2016-07-06 (×4): 90 mg via SUBCUTANEOUS
  Filled 2016-07-03 (×4): qty 1

## 2016-07-03 MED ORDER — WARFARIN - PHARMACIST DOSING INPATIENT: Freq: Every day | Status: DC

## 2016-07-03 MED ORDER — WARFARIN SODIUM 4 MG PO TABS
4.0000 mg | ORAL_TABLET | Freq: Once | ORAL | Status: AC
  Administered 2016-07-03: 4 mg via ORAL
  Filled 2016-07-03: qty 1

## 2016-07-03 MED ORDER — COUMADIN BOOK
Freq: Once | Status: AC
  Administered 2016-07-03: 16:00:00
  Filled 2016-07-03: qty 1

## 2016-07-03 MED ORDER — WARFARIN VIDEO
Freq: Once | Status: AC
  Administered 2016-07-03: 16:00:00

## 2016-07-03 MED ORDER — SODIUM CHLORIDE 0.9 % IV SOLN
INTRAVENOUS | Status: DC
  Administered 2016-07-03 – 2016-07-06 (×5): via INTRAVENOUS

## 2016-07-03 NOTE — Progress Notes (Signed)
PT Cancellation Note  Patient Details Name: Randy [name] MRN: [id number] DOB: Oct 27, 1924   Cancelled Treatment:    Reason Eval/Treat Not Completed: Atttempted PT tx session. Pt declined participation on today due to not feeling well. Will check back another day.    Weston Anna, MPT Pager: [phone]

## 2016-07-03 NOTE — Progress Notes (Signed)
Occupational Therapy Treatment Patient Details Name: Randy [name] MRN: [id number] DOB: [birth year] Today's Date: 07/03/2016    History of present illness Randy [name] is a 83 y.o. gentleman with a history of mild dementia/cognitive impairment, CAD with prior stent placement, history of atrial fibrillation S/P PPM implant  HTN, HLD, DM, and CKD ,infected right TKA(multiple surgeries.) was referred to the ED via EMS by one of his caregivers for evaluation of increased lethargy, mental status changes, and cough.  TPer historypatient is nonambulatory at baseline and spends most of his day sitting up a recliner. Aspiration of the  right knee performed.   OT comments  Pt progressing towards goals, focus of this session on bil UE HEP to increase strength and activity tolerance in preparation for completing ADLs and functional mobility. Pt completed bil UE exercises with yellow theraband, bil UE exercises holding lightweight bottle moving UE across all planes. Min verbal cues to initiate rest breaks in between each exercise. Pt will benefit from continued OT services to increase activity tolerance, safety, and independence with ADLs and functional mobility. Continue per POC.    Follow Up Recommendations  SNF    Equipment Recommendations  None recommended by OT          Precautions / Restrictions Precautions Precautions: Fall Restrictions Weight Bearing Restrictions: No       Mobility                   Transfers                 General transfer comment: Completed bed level Bil UE HEP this session, OOB mobility deferred                                                ADL either performed or assessed with clinical judgement   ADL Overall ADL's : Needs assistance/impaired Eating/Feeding: Set up;Bed level                                     General ADL Comments: Focuse of session on Bil UE HEP                         Cognition Arousal/Alertness: Awake/alert Behavior During Therapy: WFL for tasks assessed/performed Overall Cognitive Status: History of cognitive impairments - at baseline Area of Impairment: Orientation;Memory;Awareness                               General Comments: Pt follows one step commands consistently         Exercises General Exercises - Upper Extremity Shoulder Flexion: AROM;Both;10 reps;Theraband Theraband Level (Shoulder Flexion): Level 1 (Yellow) Shoulder Horizontal ABduction: AROM;10 reps;Theraband;Both Theraband Level (Shoulder Horizontal Abduction): Level 1 (Yellow) Shoulder Horizontal ADduction: AROM;Both;10 reps;Theraband Theraband Level (Shoulder Horizontal Adduction): Level 1 (Yellow) Elbow Flexion: AROM;Both;10 reps;Theraband Theraband Level (Elbow Flexion): Level 1 (Yellow) Elbow Extension: AROM;Both;10 reps;Theraband Theraband Level (Elbow Extension): Level 1 (Yellow) Wrist Flexion: AROM;10 reps;Both Wrist Extension: AROM;Both;10 reps Digit Composite Flexion: AROM;15 reps;Both Composite Extension: AROM;15 reps;Both Other Exercises Other Exercises: all bil UE theraband exercises completed at bed level with HOB elevated Other Exercises: bil UE strengthening/endurance exercises holding lightweight bottle, completing UE movements  across all planes (completing each UE separately), 10 reps each plane           General Comments      Pertinent Vitals/ Pain       Pain Assessment: Faces Faces Pain Scale: No hurt                                                          Frequency  Min 2X/week        Progress Toward Goals  OT Goals(current goals can now be found in the care plan section)  Progress towards OT goals: Progressing toward goals  Acute Rehab OT Goals Patient Stated Goal: wants to walk OT Goal Formulation: With patient Time For Goal Achievement: 07/09/16 Potential to Achieve Goals: [place]  Discharge plan remains appropriate                    AM-PAC PT "6 Clicks" Daily Activity     Outcome Measure   Help from another person eating meals?: A Little Help from another person taking care of personal grooming?: A Little Help from another person toileting, which includes using toliet, bedpan, or urinal?: Total Help from another person bathing (including washing, rinsing, drying)?: Total Help from another person to put on and taking off regular upper body clothing?: Total Help from another person to put on and taking off regular lower body clothing?: Total 6 Click Score: 10    End of Session    OT Visit Diagnosis: Muscle weakness (generalized) (M62.81)   Activity Tolerance Patient tolerated treatment well   Patient Left with call bell/phone within reach;in bed   Nurse Communication  (use of theraband, left theraband with Pt)        Time: 5396-7289 OT Time Calculation (min): 21 min  Charges: OT General Charges $OT Visit: 1 Procedure OT Treatments $Therapeutic Exercise: 8-22 mins  Randy [name], OT Pager [phone] 07/03/2016    Randy [name] 07/03/2016, 4:08 PM

## 2016-07-03 NOTE — Progress Notes (Signed)
[facility] Coordinator will continue to follow Randy [name] to support IV ABX at home if that is plan for DC.  I have communicated with caregivers in the home who have administered IV ABX for pt in the past.  [facility] will be on standby if needed to support home IV ABX and [place] services as ordered at DC.  If patient discharges after hours, please call [phone].   [name] 07/03/2016, 9:17 AM

## 2016-07-03 NOTE — Progress Notes (Signed)
PROGRESS NOTE    Randy [name]  MGQ:[id number] DOB: 08-07-1924 DOA: 06/17/2016 PCP: System, Pcp Not In     Brief Narrative:  83 y/o admitted to the hospital from home on 5/2 due to lethargy and found to have MSSA and enterococcus bacteremia. 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 As he was deemed a poor candidate for PPM removal and AKA, decision has been made, in conjunction with ID, to treat him for 6 weeks with IV unasyn and then lifelong suppression with augmentin. Will be discharging to SNF as soon as renal function improves; nephrology on board.   Assessment & Plan:   Principal Problem:   Sepsis ([place]) Active Problems:   Hypertension   Diabetes mellitus ([place])   Chronic kidney disease (CKD), stage III (moderate)   Cough   Leukocytosis   Pacemaker   Pressure injury of skin   Staphylococcus aureus bacteremia with sepsis ([place])   Pacemaker infection ([place])   Enterococcal bacteremia   Late onset Alzheimer's disease without behavioral disturbance   Leg DVT (deep venous thromboembolism), acute, bilateral ([name])   Acute renal failure superimposed on stage 3 chronic kidney disease ([place])   Acute blood loss anemia   Uncontrolled type 2 diabetes mellitus with hyperglycemia, without long-term current use of insulin ([name])   Acute on chronic renal failure ([name])   Sepsis -Secondary to MSSA/Enterococcal Bacteremia with Infection of PPM and prosthetic right knee. -As above, decision for 6 weeks of IV Unasyn followed by lifelong suppression with Augmentin.  Acute DVT -Given his Cr Cl, NOACs are not an option for him. -Will transition over to coumadin with a lovenox bridge. Discussed with pharmacy. -He has a h/o a fib, so this will be lifelong anticoagulation.   DM Type II -CBGs well controlled at present. -Will continue current regimen.  ARF on Stage III CKD -Unclear etiology. Nephrology on board. -No nephrotoxic agents, has not  been hypotensive. -diuretics are now being hold. -Cr has increased to 2.7 today. -Await further recommendations from nephrology.  Chronic A Fib -Rate controlled; has PPM. -Now on anticoagulation.  Stage II Pressure Ulcer -Present on admission. -Seen by wound care.   DVT prophylaxis: warfarin/lovenox Code Status: full code Family Communication: patient only Disposition Plan: hope for DC SNF next 24-48 hours  Consultants:   Renal  ID  Procedures:   As above  Antimicrobials:  Anti-infectives    Start     Dose/Rate Route Frequency Ordered Stop   06/29/16 1800  Ampicillin-Sulbactam (UNASYN) 3 g in sodium chloride 0.9 % 100 mL IVPB     3 g 200 mL/hr over 30 Minutes Intravenous Every 12 hours 06/29/16 0812     06/22/16 1000  Ampicillin-Sulbactam (UNASYN) 3 g in sodium chloride 0.9 % 100 mL IVPB  Status:  Discontinued     3 g 200 mL/hr over 30 Minutes Intravenous Every 6 hours 06/22/16 0839 06/29/16 0812   06/19/16 2100  nafcillin 2 g in dextrose 5 % 100 mL IVPB  Status:  Discontinued     2 g 200 mL/hr over 30 Minutes Intravenous Every 4 hours 06/18/16 2032 06/22/16 0837   06/18/16 2200  vancomycin (VANCOCIN) IVPB 1000 mg/200 mL premix  Status:  Discontinued     1,000 mg 200 mL/hr over 60 Minutes Intravenous Every 24 hours 06/18/16 1702 06/22/16 0837   06/18/16 2100  vancomycin (VANCOCIN) IVPB 750 mg/150 ml premix  Status:  Discontinued  750 mg 150 mL/hr over 60 Minutes Intravenous Every 24 hours 06/17/16 2103 06/17/16 2103   06/18/16 2100  vancomycin (VANCOCIN) IVPB 1000 mg/200 mL premix  Status:  Discontinued     1,000 mg 200 mL/hr over 60 Minutes Intravenous Every 24 hours 06/17/16 2103 06/18/16 1638   06/18/16 2100  nafcillin IVPB 2 g  Status:  Discontinued     2 g 200 mL/hr over 30 Minutes Intravenous Every 4 hours 06/18/16 2025 06/18/16 2026   06/18/16 2100  nafcillin 2 g in dextrose 5 % 50 mL IVPB     2 g 100 mL/hr over 30 Minutes Intravenous Every 4 hours  06/18/16 2027 06/19/16 1741   06/18/16 1700  nafcillin injection 2 g  Status:  Discontinued     2 g Intravenous Every 4 hours 06/18/16 1641 06/18/16 1650   06/18/16 1700  nafcillin 2 g in dextrose 5 % 100 mL IVPB  Status:  Discontinued     2 g 200 mL/hr over 30 Minutes Intravenous Every 4 hours 06/18/16 1650 06/18/16 2032   06/18/16 1600  fluconazole (DIFLUCAN) tablet 100 mg  Status:  Discontinued     100 mg Oral Daily 06/18/16 1516 06/25/16 1559   06/18/16 0400  piperacillin-tazobactam (ZOSYN) IVPB 3.375 g  Status:  Discontinued     3.375 g 12.5 mL/hr over 240 Minutes Intravenous Every 8 hours 06/17/16 2103 06/18/16 1556   06/17/16 2045  piperacillin-tazobactam (ZOSYN) IVPB 3.375 g     3.375 g 100 mL/hr over 30 Minutes Intravenous  Once 06/17/16 2032 06/17/16 2152   06/17/16 2045  vancomycin (VANCOCIN) IVPB 1000 mg/200 mL premix     1,000 mg 200 mL/hr over 60 Minutes Intravenous  Once 06/17/16 2032 06/17/16 2334       Subjective: Feels well, other than some right leg pain  Objective: Vitals:   07/02/16 0540 07/02/16 1453 07/02/16 2038 07/03/16 0700  BP: (!) 136/58 (!) 124/57 (!) 122/51   Pulse: 70 72 69   Resp: 18 16 18    Temp: 97.5 F (36.4 C) 97.7 F (36.5 C) 97.7 F (36.5 C)   TempSrc: Oral Oral Oral   SpO2: 98% 100% 96%   Weight:    88.9 kg (196 lb)  Height:        Intake/Output Summary (Last 24 hours) at 07/03/16 1005 Last data filed at 07/03/16 0600  Gross per 24 hour  Intake          1093.07 ml  Output                0 ml  Net          1093.07 ml   Filed Weights   07/01/16 1600 07/02/16 0500 07/03/16 0700  Weight: 88 kg (194 lb) 86.6 kg (191 lb) 88.9 kg (196 lb)    Examination:  General exam: Alert, awake, oriented x 3 Respiratory system: Clear to auscultation. Respiratory effort normal. Cardiovascular system:RRR. No murmurs, rubs, gallops. Gastrointestinal system: Abdomen is nondistended, soft and nontender. No organomegaly or masses felt. Normal  bowel sounds heard. Central nervous system: Alert and oriented. No focal neurological deficits. Extremities: R>L LE edema Skin: No rashes, lesions or ulcers Psychiatry: Judgement and insight appear normal. Mood & affect appropriate.     Data Reviewed: I have personally reviewed following labs and imaging studies  CBC:  Recent Labs Lab 06/28/16 0338 06/29/16 0420 06/30/16 0421 07/02/16 0500 07/03/16 0503  WBC 17.1* 17.1* 16.5* 11.2* 10.1  NEUTROABS 12.2*  --   --   --   --  HGB 7.9* 7.6* 7.5* 8.1* 7.9*  HCT 23.7* 23.7* 22.9* 25.2* 25.3*  MCV 85.6 87.1 87.7 89.0 90.4  PLT 556* 563* 575* 523* 629*   Basic Metabolic Panel:  Recent Labs Lab 06/28/16 0338 06/29/16 0420 06/30/16 0421 07/02/16 0500 07/03/16 0503  NA 138 139 139 135 138  K 3.5 3.4* 3.4* 4.0 4.1  CL 108 107 107 100* 102  CO2 23 24 24 23 25   GLUCOSE 218* 162* 121* 117* 123*  BUN 10 10 11 20  24*  CREATININE 2.04* 2.18* 2.20* 2.48* 2.75*  CALCIUM 7.9* 8.2* 8.3* 8.5* 8.7*  MG 1.9  --   --   --   --    GFR: Estimated Creatinine Clearance: 19 mL/min (A) (by C-G formula based on SCr of 2.75 mg/dL (H)). Liver Function Tests:  Recent Labs Lab 06/27/16 0546  AST 14*  ALT 6*  ALKPHOS 52  BILITOT 1.1  PROT 5.8*  ALBUMIN 1.9*   No results for input(s): LIPASE, AMYLASE in the last 168 hours. No results for input(s): AMMONIA in the last 168 hours. Coagulation Profile: No results for input(s): INR, PROTIME in the last 168 hours. Cardiac Enzymes: No results for input(s): CKTOTAL, CKMB, CKMBINDEX, TROPONINI in the last 168 hours. BNP (last 3 results) No results for input(s): PROBNP in the last 8760 hours. HbA1C: No results for input(s): HGBA1C in the last 72 hours. CBG:  Recent Labs Lab 07/02/16 0737 07/02/16 1129 07/02/16 1654 07/02/16 2035 07/03/16 0730  GLUCAP 117* 268* 141* 142* 117*   Lipid Profile: No results for input(s): CHOL, HDL, LDLCALC, TRIG, CHOLHDL, LDLDIRECT in the last 72  hours. Thyroid Function Tests: No results for input(s): TSH, T4TOTAL, FREET4, T3FREE, THYROIDAB in the last 72 hours. Anemia Panel: No results for input(s): VITAMINB12, FOLATE, FERRITIN, TIBC, IRON, RETICCTPCT in the last 72 hours. Urine analysis:    Component Value Date/Time   COLORURINE STRAW (A) 06/27/2016 1541   APPEARANCEUR CLEAR 06/27/2016 1541   LABSPEC 1.008 06/27/2016 1541   PHURINE 6.0 06/27/2016 1541   GLUCOSEU 150 (A) 06/27/2016 1541   HGBUR NEGATIVE 06/27/2016 1541   BILIRUBINUR NEGATIVE 06/27/2016 1541   KETONESUR NEGATIVE 06/27/2016 1541   PROTEINUR NEGATIVE 06/27/2016 1541   UROBILINOGEN 0.2 05/18/2014 1634   NITRITE NEGATIVE 06/27/2016 1541   LEUKOCYTESUR NEGATIVE 06/27/2016 1541   Sepsis Labs: @LABRCNTIP (procalcitonin:4,lacticidven:4)  ) Recent Results (from the past 240 hour(s))  C difficile quick scan w PCR reflex     Status: None   Collection Time: 06/26/16 11:47 AM  Result Value Ref Range Status   C Diff antigen NEGATIVE NEGATIVE Final   C Diff toxin NEGATIVE NEGATIVE Final   C Diff interpretation No C. difficile detected.  Final  Culture, Urine     Status: None   Collection Time: 06/27/16  3:41 PM  Result Value Ref Range Status   Specimen Description URINE, CLEAN CATCH  Final   Special Requests NONE  Final   Culture   Final    NO GROWTH Performed at [facility], [address], [place], [place] [address]    Report Status 06/28/2016 FINAL  Final  Culture, blood (Routine X 2) w Reflex to ID Panel     Status: None (Preliminary result)   Collection Time: 06/27/16  5:10 PM  Result Value Ref Range Status   Specimen Description BLOOD LEFT HAND  Final   Special Requests   Final    BOTTLES DRAWN AEROBIC AND ANAEROBIC Blood Culture adequate volume  Culture   Final    NO GROWTH 4 DAYS Performed at [facility], [place] [address], [place], [place] [address]    Report Status PENDING  Incomplete  Culture, blood (Routine X 2) w Reflex to ID  Panel     Status: None (Preliminary result)   Collection Time: 06/27/16  6:46 PM  Result Value Ref Range Status   Specimen Description BLOOD LEFT FOREARM  Final   Special Requests IN PEDIATRIC BOTTLE Blood Culture adequate volume  Final   Culture   Final    NO GROWTH 4 DAYS Performed at [facility], [place] [address], [place], [place] [address]    Report Status PENDING  Incomplete         Radiology Studies: No results found.      Scheduled Meds: . chlorhexidine  15 mL Mouth Rinse BID  . feeding supplement  1 Container Oral BID BM  . Gerhardt's butt cream   Topical TID  . insulin aspart  0-20 Units Subcutaneous TID WC  . insulin aspart  0-5 Units Subcutaneous QHS  . insulin aspart  10 Units Subcutaneous TID WC  . insulin detemir  10 Units Subcutaneous BID  . mouth rinse  15 mL Mouth Rinse q12n4p  . pantoprazole  40 mg Oral BID AC  . potassium chloride  40 mEq Oral Daily  . sodium chloride flush  10-40 mL Intracatheter Q12H   Continuous Infusions: . sodium chloride 10 mL/hr at 06/28/16 2300  . ampicillin-sulbactam (UNASYN) IV Stopped (07/03/16 8675)  . heparin 1,600 Units/hr (07/02/16 2141)     LOS: 16 days    Time spent: 35 minutes. Greater than 50% of this time was spent in direct contact with the patient coordinating care.     Lelon Frohlich, MD [facility] Pager [phone]  If 7PM-7AM, please contact night-coverage [email or web address] Password [email or web address] 07/03/2016, 10:05 AM

## 2016-07-03 NOTE — Progress Notes (Signed)
[facility] Progress Note   Subjective: creat up 2.7, poor UOP off lasix. Pt w/o c/o's.   Vitals:   07/02/16 1453 07/02/16 2038 07/03/16 0700 07/03/16 1300  BP: (!) 124/57 (!) 122/51  (!) 128/47  Pulse: 72 69  70  Resp: 16 18  18   Temp: 97.7 F (36.5 C) 97.7 F (36.5 C)  97.8 F (36.6 C)  TempSrc: Oral Oral  Oral  SpO2: 100% 96%  98%  Weight:   88.9 kg (196 lb)   Height:        Inpatient medications: . chlorhexidine  15 mL Mouth Rinse BID  . enoxaparin (LOVENOX) injection  1 mg/kg Subcutaneous Q24H  . feeding supplement  1 Container Oral BID BM  . Gerhardt's butt cream   Topical TID  . insulin aspart  0-20 Units Subcutaneous TID WC  . insulin aspart  0-5 Units Subcutaneous QHS  . insulin aspart  10 Units Subcutaneous TID WC  . insulin detemir  10 Units Subcutaneous BID  . mouth rinse  15 mL Mouth Rinse q12n4p  . pantoprazole  40 mg Oral BID AC  . potassium chloride  40 mEq Oral Daily  . sodium chloride flush  10-40 mL Intracatheter Q12H  . Warfarin - Pharmacist Dosing Inpatient   Does not apply q1800   . sodium chloride 10 mL/hr at 06/28/16 2300  . sodium chloride    . ampicillin-sulbactam (UNASYN) IV 3 g (07/03/16 1716)   sodium chloride flush  Exam: Gen chron ill appearing, no distress No jvd or bruits Chest clear bilat RRR no MRG Abd soft ntnd no mass or ascites +bs GU normal male w condom cath draining clear yellowish urine MS R knee tender, mildly swollen Ext 1+ RLE edema, 1+ pannus / abd wall edema Neuro is alert, Ox 3 ,gen'd weakness, poor movement of LE's  IP Antibiotics >  Diflucan = dc'd on 5/10 Nystatin powder dc'd on 5/9 Vanc = dc'd on 5/7 Nafcillin = dc'd on 5/7 Unasyn 5/7 > current  UA 5/12 > clear, no bact, glu 150 prot neg, rbc / wbc 0-5  Renal US May 13 >  - Right Kidney 10.3 cm. 8.5 cm cyst. Mild cortical thinning - Left Kidney: Length: 10.1 cm. Echogenicity within normal limits. No mass or hydronephrosis  visualized.   Assessment: 1  Acute on CKD - baseline creat 1.3 in March, AKI in setting of acute R knee infection w bacteremia. No specific renal toxins noted, no hypotension. Unclear cause. UA benigh, [place] norma. Not HD candidate. Creat rising , 2.7 today.  Will resume IVF"s at 75 cc/hr, see if this helps, may be overdiuresed.  2  Septic R knee prosthesis - not surg candidate, on IV abx for enterococcus/ MSSA bacteremia 3  Vol - looks euvolemic, some ankle edema 4  Hx PPM 5  HTN bp's stable 6  Hx MI   Plan - IVF"s   Kelly Splinter MD [facility] pager [phone]   07/03/2016, 5:27 PM    Recent Labs Lab 06/30/16 0421 07/02/16 0500 07/03/16 0503  NA 139 135 138  K 3.4* 4.0 4.1  CL 107 100* 102  CO2 24 23 25   GLUCOSE 121* 117* 123*  BUN 11 20 24*  CREATININE 2.20* 2.48* 2.75*  CALCIUM 8.3* 8.5* 8.7*    Recent Labs Lab 06/27/16 0546  AST 14*  ALT 6*  ALKPHOS 52  BILITOT 1.1  PROT 5.8*  ALBUMIN 1.9*    Recent Labs Lab 06/28/16  3845  06/30/16 0421 07/02/16 0500 07/03/16 0503  WBC 17.1*  < > 16.5* 11.2* 10.1  NEUTROABS 12.2*  --   --   --   --   HGB 7.9*  < > 7.5* 8.1* 7.9*  HCT 23.7*  < > 22.9* 25.2* 25.3*  MCV 85.6  < > 87.7 89.0 90.4  PLT 556*  < > 575* 523* 580*  < > = values in this interval not displayed. Iron/TIBC/Ferritin/ %Sat    Component Value Date/Time   IRON 78 06/27/2016 0546   TIBC 161 (L) 06/27/2016 0546   FERRITIN 896 (H) 06/27/2016 0546   IRONPCTSAT 48 (H) 06/27/2016 3646

## 2016-07-03 NOTE — Progress Notes (Signed)
ANTICOAGULATION CONSULT NOTE   Pharmacy Consult heparin infusion --> enoxaparin/warfarin Indication: DVT, atrial fibrillation  No Known Allergies  Patient Measurements: Height: 5\' 11"  (180.3 cm) Weight: 196 lb (88.9 kg) IBW/kg (Calculated) : 75.3 Heparin Dosing Weight: 87 kg  Vital Signs:    Labs:  Recent Labs  07/02/16 0500 07/03/16 0503  HGB 8.1* 7.9*  HCT 25.2* 25.3*  PLT 523* 580*  APTT  --  98*  HEPARINUNFRC  --  >2.20*  CREATININE 2.48* 2.75*    Estimated Creatinine Clearance: 19 mL/min (A) (by C-G formula based on SCr of 2.75 mg/dL (H)).   Medical History: Past Medical History:  Diagnosis Date  . Anxiety   . Arthritis    R knee,   . CKD (chronic kidney disease)   . Coronary artery disease   . Diabetes mellitus without complication ([place])   . Dysrhythmia    PAF;,sss s/p [employer] PPM  . Hyperlipidemia   . Hypertension   . MI (myocardial infarction) ([place]) 2014  . Presence of permanent cardiac pacemaker     Assessment: 23 yoM admitted for sepsis secondary to MSSA/Enterococcus bacteremia, recurrent right prosthetic knee infection, groin cellulitis.  Lower extremity venous duplex completed on 06/24/2016 + for acute DVTs. Pt was initially placed on IV heparin, then transitioned to Apixaban 5/12, transitioned back to heparin infusion 5/17 given worsening renal function. No dose adjustment of Apixaban recommended in renal impairment in treatment of VTE. However patients with SCr > 2.5 mg/dL or CrCl < 25 ml/min were excluded from the clinical trials. Today, asked to transitioned patient to warfarin with enoxaparin bridge today in order to facilitate discharge to SNF soon.   - abd CT on 5/11 neg for bleed -- GI added PPI and signed off  Today, 07/03/2016: - SCr worsening, up to 2.75 with CrCl ~ 19 ml/min - CBC: Hgb low, but stable, Pltc elevated  - Heparin level this AM elevated, as expected (due to effects of apixaban on heparin level). APTT 98 seconds, within goal  range.   - AST/ALT low, Tbili WNL, Albumin low at 1.9 (on 5/12)  Goal of Therapy:  INR 2-3 Anti-Xa level 0.6-1 units/ml 4hrs after LMWH dose given Monitor platelets by anticoagulation protocol? yes  Plan:   Stop heparin infusion (done)  One hour later, start Enoxaparin 1 mg/kg (90mg ) SQ q24h for CrCl < 30 ml/min.  Monitor renal function closely  Warfarin 4 mg PO x 1 today  Daily PT/INR  Monitor for s/sx of bleeding  Warfarin education prior to discharge (patient has been on warfarin in the past).    Lindell Spar, PharmD, BCPS Pager: [phone] 07/03/2016 12:04 PM

## 2016-07-04 LAB — BASIC METABOLIC PANEL
ANION GAP: 9 (ref 5–15)
BUN: 22 mg/dL — AB (ref 6–20)
CO2: 24 mmol/L (ref 22–32)
Calcium: 8.4 mg/dL — ABNORMAL LOW (ref 8.9–10.3)
Chloride: 106 mmol/L (ref 101–111)
Creatinine, Ser: 2.41 mg/dL — ABNORMAL HIGH (ref 0.61–1.24)
GFR calc Af Amer: 26 mL/min — ABNORMAL LOW (ref >60)
GFR calc non Af Amer: 22 mL/min — ABNORMAL LOW (ref >60)
GLUCOSE: 105 mg/dL — AB (ref 65–99)
POTASSIUM: 4.2 mmol/L (ref 3.5–5.1)
Sodium: 139 mmol/L (ref 135–145)

## 2016-07-04 LAB — PROTIME-INR
INR: 1.46
Prothrombin Time: 17.9 seconds — ABNORMAL HIGH (ref 11.4–15.2)

## 2016-07-04 LAB — CBC
HEMATOCRIT: 24.7 % — AB (ref 39.0–52.0)
HEMOGLOBIN: 7.7 g/dL — AB (ref 13.0–17.0)
MCH: 28.6 pg (ref 26.0–34.0)
MCHC: 31.2 g/dL (ref 30.0–36.0)
MCV: 91.8 fL (ref 78.0–100.0)
Platelets: 545 10*3/uL — ABNORMAL HIGH (ref 150–400)
RBC: 2.69 MIL/uL — ABNORMAL LOW (ref 4.22–5.81)
RDW: 23.1 % — ABNORMAL HIGH (ref 11.5–15.5)
WBC: 8.2 10*3/uL (ref 4.0–10.5)

## 2016-07-04 LAB — GLUCOSE, CAPILLARY
GLUCOSE-CAPILLARY: 131 mg/dL — AB (ref 65–99)
GLUCOSE-CAPILLARY: 190 mg/dL — AB (ref 65–99)
Glucose-Capillary: 110 mg/dL — ABNORMAL HIGH (ref 65–99)
Glucose-Capillary: 203 mg/dL — ABNORMAL HIGH (ref 65–99)

## 2016-07-04 MED ORDER — WARFARIN SODIUM 4 MG PO TABS
4.0000 mg | ORAL_TABLET | Freq: Once | ORAL | Status: AC
  Administered 2016-07-04: 4 mg via ORAL
  Filled 2016-07-04: qty 1

## 2016-07-04 NOTE — Progress Notes (Signed)
[facility] Progress Note   Subjective: creat down 2.4 , great UOP, no c/o  Vitals:   07/03/16 1300 07/03/16 2044 07/04/16 0451 07/04/16 1532  BP: (!) 128/47 (!) 122/56 123/67 (!) 130/54  Pulse: 70 65 67 72  Resp: 18 18 18 19   Temp: 97.8 F (36.6 C) 97.6 F (36.4 C) 97.5 F (36.4 C) 98.7 F (37.1 C)  TempSrc: Oral Oral Oral Oral  SpO2: 98% 97% 98% 97%  Weight:   89.8 kg (197 lb 14.4 oz)   Height:        Inpatient medications: . chlorhexidine  15 mL Mouth Rinse BID  . enoxaparin (LOVENOX) injection  1 mg/kg Subcutaneous Q24H  . feeding supplement  1 Container Oral BID BM  . Gerhardt's butt cream   Topical TID  . insulin aspart  0-20 Units Subcutaneous TID WC  . insulin aspart  0-5 Units Subcutaneous QHS  . insulin aspart  10 Units Subcutaneous TID WC  . insulin detemir  10 Units Subcutaneous BID  . mouth rinse  15 mL Mouth Rinse q12n4p  . pantoprazole  40 mg Oral BID AC  . potassium chloride  40 mEq Oral Daily  . sodium chloride flush  10-40 mL Intracatheter Q12H  . warfarin  4 mg Oral ONCE-1800  . Warfarin - Pharmacist Dosing Inpatient   Does not apply q1800   . sodium chloride 10 mL/hr at 06/28/16 2300  . sodium chloride 75 mL/hr at 07/04/16 0756  . ampicillin-sulbactam (UNASYN) IV Stopped (07/04/16 0609)   sodium chloride flush  Exam: Gen chron ill appearing, no distress No jvd or bruits Chest clear bilat RRR no MRG Abd soft ntnd no mass or ascites +bs GU normal male w condom cath draining clear yellowish urine MS R knee tender, mildly swollen Ext 1+ RLE edema, 1+ pannus / abd wall edema Neuro is alert, Ox 3 ,gen'd weakness, poor movement of LE's  IP Antibiotics >  Diflucan = dc'd on 5/10 Nystatin powder dc'd on 5/9 Vanc = dc'd on 5/7 Nafcillin = dc'd on 5/7 Unasyn 5/7 > current  UA 5/12 > clear, no bact, glu 150 prot neg, rbc / wbc 0-5  Renal US May 13 >  - Right Kidney 10.3 cm. 8.5 cm cyst. Mild cortical thinning - Left Kidney:  Length: 10.1 cm. Echogenicity within normal limits. No mass or hydronephrosis visualized.   Assessment: 1  Acute on CKD - baseline creat 1.3 in March. Unclear cause, normal UA/ [place]. Creat got worse with IV lasix x 3 days, lasix stopped and IVF"s started yesterday, creat down 2.4 today. Findings c/w intravasc vol depletion as cause of AKI.  Improving  2  Septic R knee prosthesis - not surg candidate, on IV abx for enterococcus/ MSSA bacteremia 3  Vol - mild edema ext 4  Hx PPM 5  HTN bp's stable 6  Hx MI   Plan - cont IVF's   Kelly Splinter MD [facility] pager [phone]   07/04/2016, 4:40 PM    Recent Labs Lab 07/02/16 0500 07/03/16 0503 07/04/16 0324  NA 135 138 139  K 4.0 4.1 4.2  CL 100* 102 106  CO2 23 25 24   GLUCOSE 117* 123* 105*  BUN 20 24* 22*  CREATININE 2.48* 2.75* 2.41*  CALCIUM 8.5* 8.7* 8.4*   No results for input(s): AST, ALT, ALKPHOS, BILITOT, PROT, ALBUMIN in the last 168 hours.  Recent Labs Lab 06/28/16 0338  07/02/16 0500 07/03/16 0503 07/04/16 0324  WBC  17.1*  < > 11.2* 10.1 8.2  NEUTROABS 12.2*  --   --   --   --   HGB 7.9*  < > 8.1* 7.9* 7.7*  HCT 23.7*  < > 25.2* 25.3* 24.7*  MCV 85.6  < > 89.0 90.4 91.8  PLT 556*  < > 523* 580* 545*  < > = values in this interval not displayed. Iron/TIBC/Ferritin/ %Sat    Component Value Date/Time   IRON 78 06/27/2016 0546   TIBC 161 (L) 06/27/2016 0546   FERRITIN 896 (H) 06/27/2016 0546   IRONPCTSAT 48 (H) 06/27/2016 8466

## 2016-07-04 NOTE — Progress Notes (Signed)
ANTICOAGULATION CONSULT NOTE   Pharmacy Consult enoxaparin/warfarin Indication: DVT, atrial fibrillation  No Known Allergies  Patient Measurements: Height: 5\' 11"  (180.3 cm) Weight: 197 lb 14.4 oz (89.8 kg) IBW/kg (Calculated) : 75.3   Vital Signs: Temp: 97.5 F (36.4 C) (05/19 0451) Temp Source: Oral (05/19 0451) BP: 123/67 (05/19 0451) Pulse Rate: 67 (05/19 0451)  Labs:  Recent Labs  07/02/16 0500 07/03/16 0503 07/04/16 0324  HGB 8.1* 7.9* 7.7*  HCT 25.2* 25.3* 24.7*  PLT 523* 580* 545*  APTT  --  98*  --   LABPROT  --   --  17.9*  INR  --   --  1.46  HEPARINUNFRC  --  >2.20*  --   CREATININE 2.48* 2.75* 2.41*    Estimated Creatinine Clearance: 21.7 mL/min (A) (by C-G formula based on SCr of 2.41 mg/dL (H)).   Medical History: Past Medical History:  Diagnosis Date  . Anxiety   . Arthritis    R knee,   . CKD (chronic kidney disease)   . Coronary artery disease   . Diabetes mellitus without complication ([place])   . Dysrhythmia    PAF;,sss s/p [employer] Jude PPM  . Hyperlipidemia   . Hypertension   . MI (myocardial infarction) ([place]) 2014  . Presence of permanent cardiac pacemaker     Assessment: 81 yoM admitted for sepsis secondary to MSSA/Enterococcus bacteremia, recurrent right prosthetic knee infection, groin cellulitis.  Lower extremity venous duplex completed on 06/24/2016 + for acute DVTs. Pt was initially placed on IV heparin, then transitioned to Apixaban 5/12, transitioned back to heparin infusion 5/17 given worsening renal function. No dose adjustment of Apixaban recommended in renal impairment in treatment of VTE. However patients with SCr > 2.5 mg/dL or CrCl < 25 ml/min were excluded from the clinical trials. On 5/18, asked to transitioned patient to warfarin with enoxaparin bridge today in order to facilitate discharge to SNF soon.   - abd CT on 5/11 neg for bleed -- GI added PPI and signed off  Today, 07/04/2016: - SCr improved to 2.41 with CrCl ~ 22  ml/min - CBC: Hgb low, but stable, Pltc elevated  - INR 1.46 after one dose of warfarin  Goal of Therapy:  INR 2-3 Anti-Xa level 0.6-1 units/ml 4hrs after LMWH dose given Monitor platelets by anticoagulation protocol? yes  Plan:   Continue Enoxaparin 1 mg/kg (90mg ) SQ q24h for CrCl < 30 ml/min.  Monitor renal function closely  Repeat Warfarin 4 mg PO x 1 today  Daily PT/INR  Monitor for s/sx of bleeding   Lindell Spar, PharmD, BCPS Pager: [phone] 07/04/2016 2:11 PM

## 2016-07-04 NOTE — Progress Notes (Signed)
PROGRESS NOTE    Randy [name]  VOJ:[id number] DOB: Jul 31, 1924 DOA: 06/17/2016 PCP: System, Pcp Not In     Brief Narrative:  83 y/o admitted to the hospital from home on 5/2 due to lethargy and found to have MSSA and enterococcus bacteremia. 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 As he was deemed a poor candidate for PPM removal and AKA, decision has been made, in conjunction with ID, to treat him for 6 weeks with IV unasyn and then lifelong suppression with augmentin. Will be discharging to SNF as soon as renal function improves (per [place] not until Monday 5/21); nephrology on board.   Assessment & Plan:   Principal Problem:   Sepsis ([place]) Active Problems:   Hypertension   Diabetes mellitus ([place])   Chronic kidney disease (CKD), stage III (moderate)   Cough   Leukocytosis   Pacemaker   Pressure injury of skin   Staphylococcus aureus bacteremia with sepsis ([place])   Pacemaker infection ([place])   Enterococcal bacteremia   Late onset Alzheimer's disease without behavioral disturbance   Leg DVT (deep venous thromboembolism), acute, bilateral ([name])   Acute renal failure superimposed on stage 3 chronic kidney disease ([place])   Acute blood loss anemia   Uncontrolled type 2 diabetes mellitus with hyperglycemia, without long-term current use of insulin ([name])   Acute on chronic renal failure ([name])   Sepsis -Secondary to MSSA/Enterococcal Bacteremia with Infection of PPM and prosthetic right knee. -As above, decision for 6 weeks of IV Unasyn followed by lifelong suppression with Augmentin.  Acute DVT -Given his Cr Cl, NOACs are not an option for him. -Has been transitioned over to coumadin. To continue lovenox bridge until INR therapeutic. -He has a h/o a fib, so this will be lifelong anticoagulation.   DM Type II -CBGs well controlled at present. -Will continue current regimen.  ARF on Stage III CKD -Unclear etiology. Nephrology on  board. Probably was overdiuresed. -No nephrotoxic agents, has not been hypotensive. -diuretics are now being hold. -Cr has decreased to 2.4 today. -Appreciate nephrology input and recommendations.  Chronic A Fib -Rate controlled; has PPM. -Now on anticoagulation.  Stage II Pressure Ulcer -Present on admission. -Seen by wound care.   DVT prophylaxis: warfarin/lovenox Code Status: full code Family Communication: patient only Disposition Plan: hope for DC SNF on Monday 5/21  Consultants:   Renal  ID  Procedures:   As above  Antimicrobials:  Anti-infectives    Start     Dose/Rate Route Frequency Ordered Stop   06/29/16 1800  Ampicillin-Sulbactam (UNASYN) 3 g in sodium chloride 0.9 % 100 mL IVPB     3 g 200 mL/hr over 30 Minutes Intravenous Every 12 hours 06/29/16 0812     06/22/16 1000  Ampicillin-Sulbactam (UNASYN) 3 g in sodium chloride 0.9 % 100 mL IVPB  Status:  Discontinued     3 g 200 mL/hr over 30 Minutes Intravenous Every 6 hours 06/22/16 0839 06/29/16 0812   06/19/16 2100  nafcillin 2 g in dextrose 5 % 100 mL IVPB  Status:  Discontinued     2 g 200 mL/hr over 30 Minutes Intravenous Every 4 hours 06/18/16 2032 06/22/16 0837   06/18/16 2200  vancomycin (VANCOCIN) IVPB 1000 mg/200 mL premix  Status:  Discontinued     1,000 mg 200 mL/hr over 60 Minutes Intravenous Every 24 hours 06/18/16 1702 06/22/16 0837   06/18/16 2100  vancomycin (VANCOCIN) IVPB 750  mg/150 ml premix  Status:  Discontinued     750 mg 150 mL/hr over 60 Minutes Intravenous Every 24 hours 06/17/16 2103 06/17/16 2103   06/18/16 2100  vancomycin (VANCOCIN) IVPB 1000 mg/200 mL premix  Status:  Discontinued     1,000 mg 200 mL/hr over 60 Minutes Intravenous Every 24 hours 06/17/16 2103 06/18/16 1638   06/18/16 2100  nafcillin IVPB 2 g  Status:  Discontinued     2 g 200 mL/hr over 30 Minutes Intravenous Every 4 hours 06/18/16 2025 06/18/16 2026   06/18/16 2100  nafcillin 2 g in dextrose 5 % 50 mL  IVPB     2 g 100 mL/hr over 30 Minutes Intravenous Every 4 hours 06/18/16 2027 06/19/16 1741   06/18/16 1700  nafcillin injection 2 g  Status:  Discontinued     2 g Intravenous Every 4 hours 06/18/16 1641 06/18/16 1650   06/18/16 1700  nafcillin 2 g in dextrose 5 % 100 mL IVPB  Status:  Discontinued     2 g 200 mL/hr over 30 Minutes Intravenous Every 4 hours 06/18/16 1650 06/18/16 2032   06/18/16 1600  fluconazole (DIFLUCAN) tablet 100 mg  Status:  Discontinued     100 mg Oral Daily 06/18/16 1516 06/25/16 1559   06/18/16 0400  piperacillin-tazobactam (ZOSYN) IVPB 3.375 g  Status:  Discontinued     3.375 g 12.5 mL/hr over 240 Minutes Intravenous Every 8 hours 06/17/16 2103 06/18/16 1556   06/17/16 2045  piperacillin-tazobactam (ZOSYN) IVPB 3.375 g     3.375 g 100 mL/hr over 30 Minutes Intravenous  Once 06/17/16 2032 06/17/16 2152   06/17/16 2045  vancomycin (VANCOCIN) IVPB 1000 mg/200 mL premix     1,000 mg 200 mL/hr over 60 Minutes Intravenous  Once 06/17/16 2032 06/17/16 2334       Subjective: Feels well, other than some right leg pain  Objective: Vitals:   07/03/16 0700 07/03/16 1300 07/03/16 2044 07/04/16 0451  BP:  (!) 128/47 (!) 122/56 123/67  Pulse:  70 65 67  Resp:  18 18 18   Temp:  97.8 F (36.6 C) 97.6 F (36.4 C) 97.5 F (36.4 C)  TempSrc:  Oral Oral Oral  SpO2:  98% 97% 98%  Weight: 88.9 kg (196 lb)   89.8 kg (197 lb 14.4 oz)  Height:        Intake/Output Summary (Last 24 hours) at 07/04/16 1117 Last data filed at 07/04/16 0612  Gross per 24 hour  Intake          1685.92 ml  Output             1400 ml  Net           285.92 ml   Filed Weights   07/02/16 0500 07/03/16 0700 07/04/16 0451  Weight: 86.6 kg (191 lb) 88.9 kg (196 lb) 89.8 kg (197 lb 14.4 oz)    Examination:  General exam: Alert, awake, oriented x 3 Respiratory system: Clear to auscultation. Respiratory effort normal. Cardiovascular system:RRR. No murmurs, rubs, gallops. Gastrointestinal  system: Abdomen is nondistended, soft and nontender. No organomegaly or masses felt. Normal bowel sounds heard. Central nervous system: Alert and oriented. No focal neurological deficits. Extremities: R>L LE edema Skin: No rashes, lesions or ulcers Psychiatry: Judgement and insight appear normal. Mood & affect appropriate.      Data Reviewed: I have personally reviewed following labs and imaging studies  CBC:  Recent Labs Lab 06/28/16 0338 06/29/16 0420 06/30/16 0421  07/02/16 0500 07/03/16 0503 07/04/16 0324  WBC 17.1* 17.1* 16.5* 11.2* 10.1 8.2  NEUTROABS 12.2*  --   --   --   --   --   HGB 7.9* 7.6* 7.5* 8.1* 7.9* 7.7*  HCT 23.7* 23.7* 22.9* 25.2* 25.3* 24.7*  MCV 85.6 87.1 87.7 89.0 90.4 91.8  PLT 556* 563* 575* 523* 580* 588*   Basic Metabolic Panel:  Recent Labs Lab 06/28/16 0338 06/29/16 0420 06/30/16 0421 07/02/16 0500 07/03/16 0503 07/04/16 0324  NA 138 139 139 135 138 139  K 3.5 3.4* 3.4* 4.0 4.1 4.2  CL 108 107 107 100* 102 106  CO2 23 24 24 23 25 24   GLUCOSE 218* 162* 121* 117* 123* 105*  BUN 10 10 11 20  24* 22*  CREATININE 2.04* 2.18* 2.20* 2.48* 2.75* 2.41*  CALCIUM 7.9* 8.2* 8.3* 8.5* 8.7* 8.4*  MG 1.9  --   --   --   --   --    GFR: Estimated Creatinine Clearance: 21.7 mL/min (A) (by C-G formula based on SCr of 2.41 mg/dL (H)). Liver Function Tests: No results for input(s): AST, ALT, ALKPHOS, BILITOT, PROT, ALBUMIN in the last 168 hours. No results for input(s): LIPASE, AMYLASE in the last 168 hours. No results for input(s): AMMONIA in the last 168 hours. Coagulation Profile:  Recent Labs Lab 07/04/16 0324  INR 1.46   Cardiac Enzymes: No results for input(s): CKTOTAL, CKMB, CKMBINDEX, TROPONINI in the last 168 hours. BNP (last 3 results) No results for input(s): PROBNP in the last 8760 hours. HbA1C: No results for input(s): HGBA1C in the last 72 hours. CBG:  Recent Labs Lab 07/03/16 0730 07/03/16 1215 07/03/16 1643 07/03/16 2143  07/04/16 0742  GLUCAP 117* 283* 165* 98 110*   Lipid Profile: No results for input(s): CHOL, HDL, LDLCALC, TRIG, CHOLHDL, LDLDIRECT in the last 72 hours. Thyroid Function Tests: No results for input(s): TSH, T4TOTAL, FREET4, T3FREE, THYROIDAB in the last 72 hours. Anemia Panel: No results for input(s): VITAMINB12, FOLATE, FERRITIN, TIBC, IRON, RETICCTPCT in the last 72 hours. Urine analysis:    Component Value Date/Time   COLORURINE YELLOW 07/03/2016 2156   APPEARANCEUR CLEAR 07/03/2016 2156   LABSPEC 1.011 07/03/2016 2156   PHURINE 6.0 07/03/2016 2156   GLUCOSEU NEGATIVE 07/03/2016 2156   HGBUR NEGATIVE 07/03/2016 2156   BILIRUBINUR NEGATIVE 07/03/2016 2156   KETONESUR NEGATIVE 07/03/2016 2156   PROTEINUR NEGATIVE 07/03/2016 2156   UROBILINOGEN 0.2 05/18/2014 1634   NITRITE NEGATIVE 07/03/2016 2156   LEUKOCYTESUR NEGATIVE 07/03/2016 2156   Sepsis Labs: @LABRCNTIP (procalcitonin:4,lacticidven:4)  ) Recent Results (from the past 240 hour(s))  C difficile quick scan w PCR reflex     Status: None   Collection Time: 06/26/16 11:47 AM  Result Value Ref Range Status   C Diff antigen NEGATIVE NEGATIVE Final   C Diff toxin NEGATIVE NEGATIVE Final   C Diff interpretation No C. difficile detected.  Final  Culture, Urine     Status: None   Collection Time: 06/27/16  3:41 PM  Result Value Ref Range Status   Specimen Description URINE, CLEAN CATCH  Final   Special Requests NONE  Final   Culture   Final    NO GROWTH Performed at [facility], [address], [place], [place] [address]    Report Status 06/28/2016 FINAL  Final  Culture, blood (Routine X 2) w Reflex to ID Panel     Status: None   Collection Time: 06/27/16  5:10 PM  Result Value Ref Range Status   Specimen Description BLOOD LEFT HAND  Final   Special Requests   Final    BOTTLES DRAWN AEROBIC AND ANAEROBIC Blood Culture adequate volume   Culture   Final    NO GROWTH 5 DAYS Performed at [facility], [address] [place], [place] [address]    Report Status 07/03/2016 FINAL  Final  Culture, blood (Routine X 2) w Reflex to ID Panel     Status: None   Collection Time: 06/27/16  6:46 PM  Result Value Ref Range Status   Specimen Description BLOOD LEFT FOREARM  Final   Special Requests IN PEDIATRIC BOTTLE Blood Culture adequate volume  Final   Culture   Final    NO GROWTH 5 DAYS Performed at [facility], [place] [address], [place], [place] [address]    Report Status 07/03/2016 FINAL  Final         Radiology Studies: No results found.      Scheduled Meds: . chlorhexidine  15 mL Mouth Rinse BID  . enoxaparin (LOVENOX) injection  1 mg/kg Subcutaneous Q24H  . feeding supplement  1 Container Oral BID BM  . Gerhardt's butt cream   Topical TID  . insulin aspart  0-20 Units Subcutaneous TID WC  . insulin aspart  0-5 Units Subcutaneous QHS  . insulin aspart  10 Units Subcutaneous TID WC  . insulin detemir  10 Units Subcutaneous BID  . mouth rinse  15 mL Mouth Rinse q12n4p  . pantoprazole  40 mg Oral BID AC  . potassium chloride  40 mEq Oral Daily  . sodium chloride flush  10-40 mL Intracatheter Q12H  . Warfarin - Pharmacist Dosing Inpatient   Does not apply q1800   Continuous Infusions: . sodium chloride 10 mL/hr at 06/28/16 2300  . sodium chloride 75 mL/hr at 07/04/16 0756  . ampicillin-sulbactam (UNASYN) IV Stopped (07/04/16 0609)     LOS: 17 days    Time spent: 35 minutes. Greater than 50% of this time was spent in direct contact with the patient coordinating care.     Lelon Frohlich, MD [facility] Pager [phone]  If 7PM-7AM, please contact night-coverage [email or web address] Password [email or web address] 07/04/2016, 11:17 AM

## 2016-07-04 NOTE — Progress Notes (Signed)
PATIENT ID: Randy [name]  MRN: [id number]  DOB/AGE:  [birth year] / 81 y.o.  12 Days Post-Op Procedure(s): aborted TEE    PROGRESS NOTE Subjective:   Patient is alert, oriented, no Nausea, no Vomiting, yes passing gas, yes Bowel Movement. Taking PO well. Denies SOB, Chest or Calf Pain. Using Incentive Spirometer, PAS in place. Ambulate  - pt is non ambulatory, Patient reports pain as mild.    Objective: Vital signs in last 24 hours: Temp:  [97.5 F (36.4 C)-97.8 F (36.6 C)] 97.5 F (36.4 C) (05/19 0451) Pulse Rate:  [65-70] 67 (05/19 0451) Resp:  [18] 18 (05/19 0451) BP: (122-128)/(47-67) 123/67 (05/19 0451) SpO2:  [97 %-98 %] 98 % (05/19 0451) Weight:  [89.8 kg (197 lb 14.4 oz)] 89.8 kg (197 lb 14.4 oz) (05/19 0451)    Intake/Output from previous day: I/O last 3 completed shifts: In: 2439 [P.O.:720; I.V.:1419; IV Piggyback:300] Out: 2100 [Urine:2100]   Intake/Output this shift: No intake/output data recorded.   LABORATORY DATA:  Recent Labs  07/03/16 0503  07/03/16 1643 07/03/16 2143 07/04/16 0324 07/04/16 0742  WBC 10.1  --   --   --  8.2  --   HGB 7.9*  --   --   --  7.7*  --   HCT 25.3*  --   --   --  24.7*  --   PLT 580*  --   --   --  545*  --   NA 138  --   --   --  139  --   K 4.1  --   --   --  4.2  --   CL 102  --   --   --  106  --   CO2 25  --   --   --  24  --   BUN 24*  --   --   --  22*  --   CREATININE 2.75*  --   --   --  2.41*  --   GLUCOSE 123*  --   --   --  105*  --   GLUCAP  --   < > 165* 98  --  110*  INR  --   --   --   --  1.46  --   CALCIUM 8.7*  --   --   --  8.4*  --   < > = values in this interval not displayed.  Examination: Neurologically intact Neurovascular intact Sensation intact distally Dorsiflexion/Plantar flexion intact No cellulitis present continued edema}  Assessment:   12 Days Post-Op Procedure(s): aborted TEE 83 year old man with significant and severe comorbidities including bilateral deep DVTs in the femoral  veins of both legs, dementia, history of sepsis last week that seeded his revision right total knee. Any surgery on this patient in and of itself would be life-threatening, and I am not sure he would heal the wound. If he survived.  Plan: Continue close monitoring of the right total knee. The fluid re-accumulates, we will again aspirate. Continue suppressive antibiotics.  If pt discharges to SNF, he will need to follow up within 5-7 days in office with Dr. Mayer Camel.      Randy [name] 07/04/2016, 7:59 AM

## 2016-07-05 ENCOUNTER — Encounter (HOSPITAL_COMMUNITY): Payer: Self-pay

## 2016-07-05 LAB — GLUCOSE, CAPILLARY
GLUCOSE-CAPILLARY: 174 mg/dL — AB (ref 65–99)
Glucose-Capillary: 122 mg/dL — ABNORMAL HIGH (ref 65–99)
Glucose-Capillary: 143 mg/dL — ABNORMAL HIGH (ref 65–99)
Glucose-Capillary: 87 mg/dL (ref 65–99)

## 2016-07-05 LAB — CBC
HCT: 25.3 % — ABNORMAL LOW (ref 39.0–52.0)
Hemoglobin: 8.1 g/dL — ABNORMAL LOW (ref 13.0–17.0)
MCH: 29.5 pg (ref 26.0–34.0)
MCHC: 32 g/dL (ref 30.0–36.0)
MCV: 92 fL (ref 78.0–100.0)
PLATELETS: 536 10*3/uL — AB (ref 150–400)
RBC: 2.75 MIL/uL — ABNORMAL LOW (ref 4.22–5.81)
RDW: 23 % — AB (ref 11.5–15.5)
WBC: 8.1 10*3/uL (ref 4.0–10.5)

## 2016-07-05 LAB — BASIC METABOLIC PANEL
Anion gap: 9 (ref 5–15)
BUN: 20 mg/dL (ref 6–20)
CALCIUM: 8.6 mg/dL — AB (ref 8.9–10.3)
CHLORIDE: 106 mmol/L (ref 101–111)
CO2: 22 mmol/L (ref 22–32)
CREATININE: 2.2 mg/dL — AB (ref 0.61–1.24)
GFR calc Af Amer: 29 mL/min — ABNORMAL LOW (ref >60)
GFR calc non Af Amer: 25 mL/min — ABNORMAL LOW (ref >60)
Glucose, Bld: 127 mg/dL — ABNORMAL HIGH (ref 65–99)
Potassium: 4.2 mmol/L (ref 3.5–5.1)
Sodium: 137 mmol/L (ref 135–145)

## 2016-07-05 LAB — PROTIME-INR
INR: 1.23
PROTHROMBIN TIME: 15.6 s — AB (ref 11.4–15.2)

## 2016-07-05 MED ORDER — WARFARIN SODIUM 6 MG PO TABS
6.0000 mg | ORAL_TABLET | Freq: Once | ORAL | Status: AC
  Administered 2016-07-05: 6 mg via ORAL
  Filled 2016-07-05: qty 1

## 2016-07-05 NOTE — Progress Notes (Signed)
PROGRESS NOTE    Randy [name]  MEQ:[id number] DOB: 02-Sep-1924 DOA: 06/17/2016 PCP: System, Pcp Not In     Brief Narrative:  83 y/o admitted to the hospital from home on 5/2 due to lethargy and found to have MSSA and enterococcus bacteremia. 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 As he was deemed a poor candidate for PPM removal and AKA, decision has been made, in conjunction with ID, to treat him for 6 weeks with IV unasyn and then lifelong suppression with augmentin. Will be discharging to SNF on Monday. Renal function continues to improve with IVF, indicating a component of overdiuresis.  Assessment & Plan:   Principal Problem:   Sepsis ([place]) Active Problems:   Hypertension   Diabetes mellitus ([place])   Chronic kidney disease (CKD), stage III (moderate)   Cough   Leukocytosis   Pacemaker   Pressure injury of skin   Staphylococcus aureus bacteremia with sepsis ([place])   Pacemaker infection ([place])   Enterococcal bacteremia   Late onset Alzheimer's disease without behavioral disturbance   Leg DVT (deep venous thromboembolism), acute, bilateral ([name])   Acute renal failure superimposed on stage 3 chronic kidney disease ([place])   Acute blood loss anemia   Uncontrolled type 2 diabetes mellitus with hyperglycemia, without long-term current use of insulin ([name])   Acute on chronic renal failure ([name])   Sepsis -Secondary to MSSA/Enterococcal Bacteremia with Infection of PPM and prosthetic right knee. -As above, decision for 6 weeks of IV Unasyn followed by lifelong suppression with Augmentin. Not candidate for PPM removal or AKA.  Acute DVT -Given his Cr Cl, NOACs are not an option for him. -Has been transitioned over to coumadin. To continue lovenox bridge until INR therapeutic. -He has a h/o a fib, so this will be lifelong anticoagulation.   DM Type II -CBGs well controlled at present. -Will continue current regimen.  ARF on  Stage III CKD -Unclear etiology. Nephrology on board. Probably was overdiuresed (his improving renal function with IVF would point towards this as a potential etiology). -No nephrotoxic agents, has not been hypotensive. -diuretics are now being hold. -Cr has decreased to 2.2 today. -Appreciate nephrology input and recommendations.  Chronic A Fib -Rate controlled; has PPM. -Now on anticoagulation.  Stage II Pressure Ulcer -Present on admission. -Seen by wound care.   DVT prophylaxis: warfarin/lovenox Code Status: full code Family Communication: patient only Disposition Plan: plan for DC SNF on Monday 5/21  Consultants:   Renal  ID  Orthopedic Surgery  Procedures:   As above  Antimicrobials:  Anti-infectives    Start     Dose/Rate Route Frequency Ordered Stop   06/29/16 1800  Ampicillin-Sulbactam (UNASYN) 3 g in sodium chloride 0.9 % 100 mL IVPB     3 g 200 mL/hr over 30 Minutes Intravenous Every 12 hours 06/29/16 0812     06/22/16 1000  Ampicillin-Sulbactam (UNASYN) 3 g in sodium chloride 0.9 % 100 mL IVPB  Status:  Discontinued     3 g 200 mL/hr over 30 Minutes Intravenous Every 6 hours 06/22/16 0839 06/29/16 0812   06/19/16 2100  nafcillin 2 g in dextrose 5 % 100 mL IVPB  Status:  Discontinued     2 g 200 mL/hr over 30 Minutes Intravenous Every 4 hours 06/18/16 2032 06/22/16 0837   06/18/16 2200  vancomycin (VANCOCIN) IVPB 1000 mg/200 mL premix  Status:  Discontinued     1,000 mg  200 mL/hr over 60 Minutes Intravenous Every 24 hours 06/18/16 1702 06/22/16 0837   06/18/16 2100  vancomycin (VANCOCIN) IVPB 750 mg/150 ml premix  Status:  Discontinued     750 mg 150 mL/hr over 60 Minutes Intravenous Every 24 hours 06/17/16 2103 06/17/16 2103   06/18/16 2100  vancomycin (VANCOCIN) IVPB 1000 mg/200 mL premix  Status:  Discontinued     1,000 mg 200 mL/hr over 60 Minutes Intravenous Every 24 hours 06/17/16 2103 06/18/16 1638   06/18/16 2100  nafcillin IVPB 2 g  Status:   Discontinued     2 g 200 mL/hr over 30 Minutes Intravenous Every 4 hours 06/18/16 2025 06/18/16 2026   06/18/16 2100  nafcillin 2 g in dextrose 5 % 50 mL IVPB     2 g 100 mL/hr over 30 Minutes Intravenous Every 4 hours 06/18/16 2027 06/19/16 1741   06/18/16 1700  nafcillin injection 2 g  Status:  Discontinued     2 g Intravenous Every 4 hours 06/18/16 1641 06/18/16 1650   06/18/16 1700  nafcillin 2 g in dextrose 5 % 100 mL IVPB  Status:  Discontinued     2 g 200 mL/hr over 30 Minutes Intravenous Every 4 hours 06/18/16 1650 06/18/16 2032   06/18/16 1600  fluconazole (DIFLUCAN) tablet 100 mg  Status:  Discontinued     100 mg Oral Daily 06/18/16 1516 06/25/16 1559   06/18/16 0400  piperacillin-tazobactam (ZOSYN) IVPB 3.375 g  Status:  Discontinued     3.375 g 12.5 mL/hr over 240 Minutes Intravenous Every 8 hours 06/17/16 2103 06/18/16 1556   06/17/16 2045  piperacillin-tazobactam (ZOSYN) IVPB 3.375 g     3.375 g 100 mL/hr over 30 Minutes Intravenous  Once 06/17/16 2032 06/17/16 2152   06/17/16 2045  vancomycin (VANCOCIN) IVPB 1000 mg/200 mL premix     1,000 mg 200 mL/hr over 60 Minutes Intravenous  Once 06/17/16 2032 06/17/16 2334       Subjective: Feels well, other than some right leg/knee pain  Objective: Vitals:   07/04/16 1532 07/04/16 2150 07/05/16 0500 07/05/16 0506  BP: (!) 130/54 (!) 127/57  (!) 154/63  Pulse: 72 67  70  Resp: 19 20  20   Temp: 98.7 F (37.1 C) 98.5 F (36.9 C)  97.5 F (36.4 C)  TempSrc: Oral Oral  Oral  SpO2: 97% 100%  100%  Weight:   89.4 kg (197 lb)   Height:        Intake/Output Summary (Last 24 hours) at 07/05/16 1017 Last data filed at 07/05/16 0600  Gross per 24 hour  Intake             1880 ml  Output             2250 ml  Net             -370 ml   Filed Weights   07/03/16 0700 07/04/16 0451 07/05/16 0500  Weight: 88.9 kg (196 lb) 89.8 kg (197 lb 14.4 oz) 89.4 kg (197 lb)    Examination:  General exam: Alert, awake, oriented x  3 Respiratory system: Clear to auscultation. Respiratory effort normal. Cardiovascular system:RRR. No murmurs, rubs, gallops. Gastrointestinal system: Abdomen is nondistended, soft and nontender. No organomegaly or masses felt. Normal bowel sounds heard. Central nervous system: Alert and oriented. No focal neurological deficits. Extremities: R>L LE edema, heat pack over right knee. Psychiatry: Judgement and insight appear normal. Mood & affect appropriate.     Data Reviewed:  I have personally reviewed following labs and imaging studies  CBC:  Recent Labs Lab 06/30/16 0421 07/02/16 0500 07/03/16 0503 07/04/16 0324 07/05/16 0323  WBC 16.5* 11.2* 10.1 8.2 8.1  HGB 7.5* 8.1* 7.9* 7.7* 8.1*  HCT 22.9* 25.2* 25.3* 24.7* 25.3*  MCV 87.7 89.0 90.4 91.8 92.0  PLT 575* 523* 580* 545* 903*   Basic Metabolic Panel:  Recent Labs Lab 06/30/16 0421 07/02/16 0500 07/03/16 0503 07/04/16 0324 07/05/16 0323  NA 139 135 138 139 137  K 3.4* 4.0 4.1 4.2 4.2  CL 107 100* 102 106 106  CO2 24 23 25 24 22   GLUCOSE 121* 117* 123* 105* 127*  BUN 11 20 24* 22* 20  CREATININE 2.20* 2.48* 2.75* 2.41* 2.20*  CALCIUM 8.3* 8.5* 8.7* 8.4* 8.6*   GFR: Estimated Creatinine Clearance: 23.8 mL/min (A) (by C-G formula based on SCr of 2.2 mg/dL (H)). Liver Function Tests: No results for input(s): AST, ALT, ALKPHOS, BILITOT, PROT, ALBUMIN in the last 168 hours. No results for input(s): LIPASE, AMYLASE in the last 168 hours. No results for input(s): AMMONIA in the last 168 hours. Coagulation Profile:  Recent Labs Lab 07/04/16 0324 07/05/16 0323  INR 1.46 1.23   Cardiac Enzymes: No results for input(s): CKTOTAL, CKMB, CKMBINDEX, TROPONINI in the last 168 hours. BNP (last 3 results) No results for input(s): PROBNP in the last 8760 hours. HbA1C: No results for input(s): HGBA1C in the last 72 hours. CBG:  Recent Labs Lab 07/04/16 0742 07/04/16 1216 07/04/16 1644 07/04/16 2153 07/05/16 0727   GLUCAP 110* 203* 131* 190* 122*   Lipid Profile: No results for input(s): CHOL, HDL, LDLCALC, TRIG, CHOLHDL, LDLDIRECT in the last 72 hours. Thyroid Function Tests: No results for input(s): TSH, T4TOTAL, FREET4, T3FREE, THYROIDAB in the last 72 hours. Anemia Panel: No results for input(s): VITAMINB12, FOLATE, FERRITIN, TIBC, IRON, RETICCTPCT in the last 72 hours. Urine analysis:    Component Value Date/Time   COLORURINE YELLOW 07/03/2016 2156   APPEARANCEUR CLEAR 07/03/2016 2156   LABSPEC 1.011 07/03/2016 2156   PHURINE 6.0 07/03/2016 2156   GLUCOSEU NEGATIVE 07/03/2016 2156   HGBUR NEGATIVE 07/03/2016 2156   BILIRUBINUR NEGATIVE 07/03/2016 2156   KETONESUR NEGATIVE 07/03/2016 2156   PROTEINUR NEGATIVE 07/03/2016 2156   UROBILINOGEN 0.2 05/18/2014 1634   NITRITE NEGATIVE 07/03/2016 2156   LEUKOCYTESUR NEGATIVE 07/03/2016 2156   Sepsis Labs: @LABRCNTIP (procalcitonin:4,lacticidven:4)  ) Recent Results (from the past 240 hour(s))  C difficile quick scan w PCR reflex     Status: None   Collection Time: 06/26/16 11:47 AM  Result Value Ref Range Status   C Diff antigen NEGATIVE NEGATIVE Final   C Diff toxin NEGATIVE NEGATIVE Final   C Diff interpretation No C. difficile detected.  Final  Culture, Urine     Status: None   Collection Time: 06/27/16  3:41 PM  Result Value Ref Range Status   Specimen Description URINE, CLEAN CATCH  Final   Special Requests NONE  Final   Culture   Final    NO GROWTH Performed at [facility], [address], [place], [place] [address]    Report Status 06/28/2016 FINAL  Final  Culture, blood (Routine X 2) w Reflex to ID Panel     Status: None   Collection Time: 06/27/16  5:10 PM  Result Value Ref Range Status   Specimen Description BLOOD LEFT HAND  Final   Special Requests   Final    BOTTLES DRAWN AEROBIC AND ANAEROBIC  Blood Culture adequate volume   Culture   Final    NO GROWTH 5 DAYS Performed at [facility], [place] [address] [place], [place] [address]    Report Status 07/03/2016 FINAL  Final  Culture, blood (Routine X 2) w Reflex to ID Panel     Status: None   Collection Time: 06/27/16  6:46 PM  Result Value Ref Range Status   Specimen Description BLOOD LEFT FOREARM  Final   Special Requests IN PEDIATRIC BOTTLE Blood Culture adequate volume  Final   Culture   Final    NO GROWTH 5 DAYS Performed at [facility], [place] [address], [place], [place] [address]    Report Status 07/03/2016 FINAL  Final         Radiology Studies: No results found.      Scheduled Meds: . chlorhexidine  15 mL Mouth Rinse BID  . enoxaparin (LOVENOX) injection  1 mg/kg Subcutaneous Q24H  . feeding supplement  1 Container Oral BID BM  . insulin aspart  0-20 Units Subcutaneous TID WC  . insulin aspart  0-5 Units Subcutaneous QHS  . insulin aspart  10 Units Subcutaneous TID WC  . insulin detemir  10 Units Subcutaneous BID  . mouth rinse  15 mL Mouth Rinse q12n4p  . pantoprazole  40 mg Oral BID AC  . potassium chloride  40 mEq Oral Daily  . sodium chloride flush  10-40 mL Intracatheter Q12H  . Warfarin - Pharmacist Dosing Inpatient   Does not apply q1800   Continuous Infusions: . sodium chloride 10 mL/hr at 06/28/16 2300  . sodium chloride 75 mL/hr at 07/04/16 2146  . ampicillin-sulbactam (UNASYN) IV Stopped (07/05/16 0535)     LOS: 18 days    Time spent: 25 minutes. Greater than 50% of this time was spent in direct contact with the patient coordinating care.     Lelon Frohlich, MD [facility] Pager [phone]  If 7PM-7AM, please contact night-coverage [email or web address] Password [email or web address] 07/05/2016, 10:17 AM

## 2016-07-05 NOTE — Progress Notes (Signed)
Pharmacy Antibiotic Note  Randy [name] is a 83 y.o. male with pacemaker and prosthetic knee admitted on 06/17/2016 with sepsis. ID narrowed antibiotic therapy from Nafcillin and Vancomyin to Unasyn for right prosthetic joint infection and bacteremia due to MSSA, CoNS, and ampicillin sensitive Enterococcus. TTE negative for vegetations. Unable to perform TEE.  Not surgical candidate for AKA.  ID recommends to treat with 6 weeks of IV abx (through 07/31/16) and then transition to augmentin indefinitely for suppression.  - PICC placed on 06/23/16  Today, 07/05/16:  - day #18 of abx - afebrile - WBC improved to WNL  - SCr improved to 2.2 with CrCl ~ 24 ml/min   Plan: - For CrCl < 30 ml.min, Continue Unasyn 3g IV q12h. - Monitor renal function closely  _________________________  Height: 5\' 11"  (180.3 cm) Weight: 197 lb (89.4 kg) IBW/kg (Calculated) : 75.3  Temp (24hrs), Avg:98.2 F (36.8 C), Min:97.5 F (36.4 C), Max:98.7 F (37.1 C)   Recent Labs Lab 06/30/16 0421 07/02/16 0500 07/03/16 0503 07/04/16 0324 07/05/16 0323  WBC 16.5* 11.2* 10.1 8.2 8.1  CREATININE 2.20* 2.48* 2.75* 2.41* 2.20*    Estimated Creatinine Clearance: 23.8 mL/min (A) (by C-G formula based on SCr of 2.2 mg/dL (H)).    No Known Allergies  Antimicrobials this admission:  5/2 vanc >> 5/7 5/2 zosyn >> 5/3 5/3 naficillin >> 5/7 5/3 fluconazole >> 5/10 5/7 unasyn >> (6/15)   Dose adjustments this admission:  5/5 2130 VT: 14 mg/L, continue Vanc 1 Gm IV q24h f/u scr daily  Microbiology results:  5/2 BCx: 1/2 with MSSA, Enterococcus faecalis (ampicillin sensitive); 1/2 with CoNS FINAL 5/2 influenza PCR: neg 5/2 strep/legionella ur ag: neg 5/3 MRSA PCR: positive 5/3 Trach aspirate: c/w normal respiratory flora 5/3 BCx: 1/2 Staph aureus (BCID = MSSA) FINAL 5/4 synovial fluid from R knee: moderate MSSA FINAL 5/4 BCx: NGF 5/11 cdiff: neg 5/12 BCx: NGF 5/12 UCx: NGF  Thank you for allowing pharmacy  to be a part of this patient's care.   Lindell Spar, PharmD, BCPS Pager: [phone] 07/05/2016 2:13 PM

## 2016-07-05 NOTE — Progress Notes (Signed)
[facility] Progress Note   Subjective: creat down 2.4 , great UOP, no c/o  Vitals:   07/04/16 2150 07/05/16 0500 07/05/16 0506 07/05/16 1602  BP: (!) 127/57  (!) 154/63 (!) 137/49  Pulse: 67  70 79  Resp: 20  20 19   Temp: 98.5 F (36.9 C)  97.5 F (36.4 C) 98.7 F (37.1 C)  TempSrc: Oral  Oral Oral  SpO2: 100%  100% 99%  Weight:  89.4 kg (197 lb)    Height:        Inpatient medications: . chlorhexidine  15 mL Mouth Rinse BID  . enoxaparin (LOVENOX) injection  1 mg/kg Subcutaneous Q24H  . feeding supplement  1 Container Oral BID BM  . insulin aspart  0-20 Units Subcutaneous TID WC  . insulin aspart  0-5 Units Subcutaneous QHS  . insulin aspart  10 Units Subcutaneous TID WC  . insulin detemir  10 Units Subcutaneous BID  . mouth rinse  15 mL Mouth Rinse q12n4p  . pantoprazole  40 mg Oral BID AC  . potassium chloride  40 mEq Oral Daily  . sodium chloride flush  10-40 mL Intracatheter Q12H  . Warfarin - Pharmacist Dosing Inpatient   Does not apply q1800   . sodium chloride 10 mL/hr at 06/28/16 2300  . sodium chloride 75 mL/hr at 07/05/16 1021  . ampicillin-sulbactam (UNASYN) IV Stopped (07/05/16 1733)   sodium chloride flush  Exam: Gen chron ill appearing, no distress No jvd or bruits Chest clear bilat RRR no MRG Abd soft ntnd no mass or ascites +bs GU normal male w condom cath draining clear yellowish urine MS R knee tender, mildly swollen Ext 1+ RLE edema, 1+ pannus / abd wall edema Neuro is alert, Ox 3 ,gen'd weakness, poor movement of LE's  IP Antibiotics >  Diflucan = dc'd on 5/10 Nystatin powder dc'd on 5/9 Vanc = dc'd on 5/7 Nafcillin = dc'd on 5/7 Unasyn 5/7 > current  UA 5/12 > clear, no bact, glu 150 prot neg, rbc / wbc 0-5  Renal US May 13 >  - Right Kidney 10.3 cm. 8.5 cm cyst. Mild cortical thinning - Left Kidney: Length: 10.1 cm. Echogenicity within normal limits. No mass or hydronephrosis visualized.   Assessment: 1   Acute on CKD - baseline creat 1.3 in March. Unclear cause, normal UA/ [place]. Creat got worse w/ diuresis, improving w/ IVF's now.  Creat down 2.2 today. Would cont IVF's for another 1-2 days, decreased to 50/hr. 3rd spacing is an issue prob due to associated infection.  Not HD candidate.  No further suggestions, will sign off.  2  Septic prosthetic R knee - not surg candidate, on IV abx for enterococcus/ MSSA bacteremia 3  Vol - mild edema ext 4  Hx PPM 5  HTN bp's stable 6  Hx MI   Plan - as above   Kelly Splinter MD [facility] pager [phone]   07/05/2016, 7:10 PM    Recent Labs Lab 07/03/16 0503 07/04/16 0324 07/05/16 0323  NA 138 139 137  K 4.1 4.2 4.2  CL 102 106 106  CO2 25 24 22   GLUCOSE 123* 105* 127*  BUN 24* 22* 20  CREATININE 2.75* 2.41* 2.20*  CALCIUM 8.7* 8.4* 8.6*   No results for input(s): AST, ALT, ALKPHOS, BILITOT, PROT, ALBUMIN in the last 168 hours.  Recent Labs Lab 07/03/16 0503 07/04/16 0324 07/05/16 0323  WBC 10.1 8.2 8.1  HGB 7.9* 7.7* 8.1*  HCT  25.3* 24.7* 25.3*  MCV 90.4 91.8 92.0  PLT 580* 545* 536*   Iron/TIBC/Ferritin/ %Sat    Component Value Date/Time   IRON 78 06/27/2016 0546   TIBC 161 (L) 06/27/2016 0546   FERRITIN 896 (H) 06/27/2016 0546   IRONPCTSAT 48 (H) 06/27/2016 2334

## 2016-07-05 NOTE — Progress Notes (Signed)
ANTICOAGULATION CONSULT NOTE   Pharmacy Consult enoxaparin/warfarin Indication: DVT, atrial fibrillation  No Known Allergies  Patient Measurements: Height: 5\' 11"  (180.3 cm) Weight: 197 lb (89.4 kg) IBW/kg (Calculated) : 75.3   Vital Signs: Temp: 97.5 F (36.4 C) (05/20 0506) Temp Source: Oral (05/20 0506) BP: 154/63 (05/20 0506) Pulse Rate: 70 (05/20 0506)  Labs:  Recent Labs  07/03/16 0503 07/04/16 0324 07/05/16 0323  HGB 7.9* 7.7* 8.1*  HCT 25.3* 24.7* 25.3*  PLT 580* 545* 536*  APTT 98*  --   --   LABPROT  --  17.9* 15.6*  INR  --  1.46 1.23  HEPARINUNFRC >2.20*  --   --   CREATININE 2.75* 2.41* 2.20*    Estimated Creatinine Clearance: 23.8 mL/min (A) (by C-G formula based on SCr of 2.2 mg/dL (H)).   Medical History: Past Medical History:  Diagnosis Date  . Anxiety   . Arthritis    R knee,   . CKD (chronic kidney disease)   . Coronary artery disease   . Diabetes mellitus without complication ([place])   . Dysrhythmia    PAF;,sss s/p [employer] PPM  . Hyperlipidemia   . Hypertension   . MI (myocardial infarction) ([place]) 2014  . Presence of permanent cardiac pacemaker     Assessment: 9 yoM admitted for sepsis secondary to MSSA/Enterococcus bacteremia, recurrent right prosthetic knee infection, groin cellulitis.  Lower extremity venous duplex completed on 06/24/2016 + for acute DVTs. Pt was initially placed on IV heparin, then transitioned to Apixaban 5/12, transitioned back to heparin infusion 5/17 given worsening renal function. No dose adjustment of Apixaban recommended in renal impairment in treatment of VTE. However patients with SCr > 2.5 mg/dL or CrCl < 25 ml/min were excluded from the clinical trials. On 5/18, asked to transitioned patient to warfarin with enoxaparin bridge in order to facilitate discharge to SNF soon.   - abd CT on 5/11 neg for bleed -- GI added PPI and signed off  Today, 07/05/2016: - SCr improved to 2.2 with CrCl ~ 24 ml/min - CBC:  Hgb low, but stable, Pltc elevated  - INR decreased to 1.23 after two doses of warfarin  Goal of Therapy:  INR 2-3 Anti-Xa level 0.6-1 units/ml 4hrs after LMWH dose given Monitor platelets by anticoagulation protocol? yes  Plan:   Continue Enoxaparin 1 mg/kg (90mg ) SQ q24h for CrCl < 30 ml/min for at least 5 day overlap with warfarin and until INR therapeutic x at least 24 hours.  Monitor renal function closely  Give higher dose of Warfarin 6 mg PO x 1 at 1400.   Daily PT/INR  Monitor for s/sx of bleeding   Lindell Spar, PharmD, BCPS Pager: [phone] 07/05/2016 1:22 PM

## 2016-07-06 LAB — BASIC METABOLIC PANEL
ANION GAP: 8 (ref 5–15)
BUN: 19 mg/dL (ref 6–20)
CO2: 22 mmol/L (ref 22–32)
Calcium: 8.6 mg/dL — ABNORMAL LOW (ref 8.9–10.3)
Chloride: 108 mmol/L (ref 101–111)
Creatinine, Ser: 1.95 mg/dL — ABNORMAL HIGH (ref 0.61–1.24)
GFR calc Af Amer: 33 mL/min — ABNORMAL LOW (ref >60)
GFR, EST NON AFRICAN AMERICAN: 29 mL/min — AB (ref >60)
GLUCOSE: 106 mg/dL — AB (ref 65–99)
Potassium: 4.4 mmol/L (ref 3.5–5.1)
Sodium: 138 mmol/L (ref 135–145)

## 2016-07-06 LAB — PROTIME-INR
INR: 1.29
PROTHROMBIN TIME: 16.2 s — AB (ref 11.4–15.2)

## 2016-07-06 LAB — CBC
HEMATOCRIT: 26.6 % — AB (ref 39.0–52.0)
HEMOGLOBIN: 8 g/dL — AB (ref 13.0–17.0)
MCH: 28.4 pg (ref 26.0–34.0)
MCHC: 30.1 g/dL (ref 30.0–36.0)
MCV: 94.3 fL (ref 78.0–100.0)
Platelets: 522 10*3/uL — ABNORMAL HIGH (ref 150–400)
RBC: 2.82 MIL/uL — ABNORMAL LOW (ref 4.22–5.81)
RDW: 23.4 % — AB (ref 11.5–15.5)
WBC: 8 10*3/uL (ref 4.0–10.5)

## 2016-07-06 LAB — GLUCOSE, CAPILLARY
GLUCOSE-CAPILLARY: 90 mg/dL (ref 65–99)
Glucose-Capillary: 130 mg/dL — ABNORMAL HIGH (ref 65–99)

## 2016-07-06 MED ORDER — WARFARIN SODIUM 6 MG PO TABS: 6.0000 mg | ORAL_TABLET | Freq: Every day | ORAL | 11 refills | Status: AC

## 2016-07-06 MED ORDER — ENOXAPARIN SODIUM 100 MG/ML ~~LOC~~ SOLN: 1.0000 mg/kg | SUBCUTANEOUS | Status: AC

## 2016-07-06 MED ORDER — SODIUM CHLORIDE 0.9 % IV SOLN: 3.0000 g | Freq: Two times a day (BID) | INTRAVENOUS | Status: AC

## 2016-07-06 MED ORDER — WARFARIN SODIUM 6 MG PO TABS
6.0000 mg | ORAL_TABLET | Freq: Once | ORAL | Status: AC
  Administered 2016-07-06: 6 mg via ORAL
  Filled 2016-07-06: qty 1

## 2016-07-06 MED ORDER — PANTOPRAZOLE SODIUM 40 MG PO TBEC: 40.0000 mg | DELAYED_RELEASE_TABLET | Freq: Two times a day (BID) | ORAL | Status: AC

## 2016-07-06 MED ORDER — INSULIN DETEMIR 100 UNIT/ML ~~LOC~~ SOLN: 10.0000 [IU] | Freq: Two times a day (BID) | SUBCUTANEOUS | 11 refills | Status: AC

## 2016-07-06 MED ORDER — AMPICILLIN-SULBACTAM IV (FOR PTA / DISCHARGE USE ONLY): 3.0000 g | Freq: Two times a day (BID) | INTRAVENOUS | 0 refills | Status: AC

## 2016-07-06 NOTE — Progress Notes (Signed)
Patient is a&ox4, no changes from am assessment. AVS printed and reviewed with [facility] at [facility]. [phone]. Questions, concerns denied

## 2016-07-06 NOTE — Progress Notes (Signed)
CSW contacted [facility] to inform staff that patient is ready for discharge. CSW contacted by staff member from [facility] from [facility], staff reported that patient can arrive at SNF today and requested that patient arrive prior to 4:30pm. CSW faxed patient's discharge summary to SNF. CSW informed patient's RN. CSW contacted patient's son [name]) and provided him with number for [employer] to arrange private pay for patient's transport to SNF.

## 2016-07-06 NOTE — Discharge Summary (Signed)
Physician Discharge Summary  Randy [name] OJJ:[id number] DOB: 07-05-1924 DOA: 06/17/2016  PCP: System, Pcp Not In  Admit date: 06/17/2016 Discharge date: 07/06/2016  Time spent: 45 minutes  Recommendations for Outpatient Follow-up:  -Will be discharged to SNF today. -Will be on unasyn until 6/18 to complete 6 weeks of treatment; afterwards will need lifelong suppression therapy with augmentin. -Coumadin 6 mg daily, to be adjusted based on daily INRs. Lovenox 90 mg SQ daily until INR therapeutic.   Discharge Diagnoses:  Principal Problem:   Sepsis ([place]) Active Problems:   Hypertension   Diabetes mellitus ([name])   Chronic kidney disease (CKD), stage III (moderate)   Cough   Leukocytosis   Pacemaker   Pressure injury of skin   Staphylococcus aureus bacteremia with sepsis ([name])   Pacemaker infection ([place])   Enterococcal bacteremia   Late onset Alzheimer's disease without behavioral disturbance   Leg DVT (deep venous thromboembolism), acute, bilateral ([name])   Acute renal failure superimposed on stage 3 chronic kidney disease ([place])   Acute blood loss anemia   Uncontrolled type 2 diabetes mellitus with hyperglycemia, without long-term current use of insulin ([place])   Acute on chronic renal failure [facility])   Discharge Condition: Stable and improved  Filed Weights   07/04/16 0451 07/05/16 0500 07/06/16 0541  Weight: 89.8 kg (197 lb 14.4 oz) 89.4 kg (197 lb) 88.5 kg (195 lb)    History of present illness:  As per Dr. Eulas Post on 5/2: Randy [name] is a 83 y.o. gentleman with a history of mild dementia/cognitive impairment, CAD with prior stent placement, history of atrial fibrillation S/P PPM implant (CHADS-Vasc score of at least four, no anticoagulation), HTN, HLD, DM, and CKD 3 with baseline creatinine around 1.3-1.5 who was referred to the ED via EMS by one of his caregivers for evaluation of increased lethargy, mental status changes, and cough.  The patient is nonambulatory at  baseline and spends most of his day sitting up a recliner.  He has caregiver support 24 hours/day.  He has had cough for 4-5 days.  He has had two known sick contacts (son-in-law and caregiver).  He has had yellow sputum.  No chest pain or shortness of breath.  No nausea or vomiting.  No abdominal pain.  No dysuria.  No diarrhea.  Today, he was noted to have increased lethargy and muffled speech with slurring.  No other focal weakenss.  911 was called and the patient was transferred to the ED for evaluation.  No recent hospitalizations.  No recent antibiotics.  He has been using OTC mucinex for his symptoms.  No overt signs or symptoms of aspiration.  Hospital Course:   Sepsis -Secondary to MSSA/Enterococcal Bacteremia with Infection of PPM and prosthetic right knee. -As above, decision for 6 weeks of IV Unasyn followed by lifelong suppression with Augmentin. Not candidate for PPM removal or AKA.  Acute DVT -Given his Cr Cl, NOACs are not an option for him. -Has been transitioned over to coumadin. To continue lovenox bridge until INR therapeutic. DC doses above. -He has a h/o a fib, so this will be lifelong anticoagulation.   DM Type II -CBGs well controlled at present.  ARF on Stage III CKD -Unclear etiology. Nephrology on board. Probably was overdiuresed (his improving renal function with IVF would point towards this as a potential etiology). -No nephrotoxic agents, has not been hypotensive. -diuretics are now being hold. -Cr has decreased to 1.9 on day of DC. -Appreciate nephrology input  and recommendations.  Chronic A Fib -Rate controlled; has PPM. -Now on anticoagulation.  Stage II Pressure Ulcer -Present on admission. -Seen by wound care.   Procedures:  As above   Consultations:  ID  Nephrology  Orthopedic Surgery  Discharge Instructions  Discharge Instructions    Diet - low sodium heart healthy    Complete by:  As directed    Increase activity  slowly    Complete by:  As directed      Allergies as of 07/06/2016   No Known Allergies     Medication List    TAKE these medications   Ampicillin-Sulbactam 3 g in sodium chloride 0.9 % 100 mL Inject 3 g into the vein every 12 (twelve) hours.   enoxaparin 100 MG/ML injection Commonly known as:  LOVENOX Inject 0.9 mLs (90 mg total) into the skin daily.   insulin detemir 100 UNIT/ML injection Commonly known as:  LEVEMIR Inject 0.1 mLs (10 Units total) into the skin 2 (two) times daily.   pantoprazole 40 MG tablet Commonly known as:  PROTONIX Take 1 tablet (40 mg total) by mouth 2 (two) times daily before a meal.   warfarin 6 MG tablet Commonly known as:  COUMADIN Take 1 tablet (6 mg total) by mouth daily.      No Known Allergies Follow-up Information    [name], MD Follow up in 5 day(s).   Specialty:  Orthopedic Surgery Contact information: [place] [address] [phone]            The results of significant diagnostics from this hospitalization (including imaging, microbiology, ancillary and laboratory) are listed below for reference.    Significant Diagnostic Studies: Ct Abdomen Pelvis Wo Contrast  Result Date: 06/26/2016 CLINICAL DATA:  Evaluate for retroperitoneal hematoma. Acute blood loss/anemia. EXAM: CT ABDOMEN AND PELVIS WITHOUT CONTRAST TECHNIQUE: Multidetector CT imaging of the abdomen and pelvis was performed following the standard protocol without IV contrast. COMPARISON:  04/17/2013 FINDINGS: Lower chest: There are small bilateral pleural effusions identified peer increased interstitial markings within the lung bases compatible with pulmonary edema. The heart size appears enlarged. Aortic atherosclerosis identified. RCA coronary artery calcifications are identified. Hepatobiliary: No focal liver abnormality. Previous cholecystectomy. No biliary dilatation. Pancreas: Unremarkable. No pancreatic ductal dilatation or surrounding  inflammatory changes. Spleen: Lobulated contour the spleen noted. Several accessory spleens are identified. Findings may reflect sequelae of prior splenic injury. Adrenals/Urinary Tract: The adrenal glands are normal. Large right kidney cyst is identified measuring 2.4 cm with small thin areas of mural calcification. There is no mass or hydronephrosis identified. The urinary bladder appears normal. Stomach/Bowel: The stomach is unremarkable. No abnormal dilatation of the small or large bowel loops. The appendix is visualized and appears normal. Sigmoid diverticulosis noted without acute inflammation. Vascular/Lymphatic: Aortic atherosclerosis noted. No aneurysm. No upper abdominal or pelvic adenopathy. No inguinal adenopathy. Reproductive: Prostate is unremarkable. Other: No ascites or focal fluid collections identified. There is no evidence for retroperitoneal hematoma. Edema is identified compatible with anasarca which may be secondary to CHF. There is soft tissue edema involving both lower extremities. Musculoskeletal: Degenerative disc disease noted within the lumbar spine. IMPRESSION: 1. No evidence for hematoma. 2. Cardiac enlargement, Aortic Atherosclerosis (ICD10-I70.0). , pleural effusions, pulmonary edema, and anasarca compatible with congestive heart failure. 3. Right kidney cyst containing thin areas of mural calcification is incompletely characterized without IV contrast. Electronically Signed   By: Kerby Moors M.D.   On: 06/26/2016 15:46   Dg Chest 2  View  Result Date: 06/17/2016 CLINICAL DATA:  Lethargy and cough EXAM: CHEST  2 VIEW COMPARISON:  04/23/2014 FINDINGS: Cardiac shadow is stable. Pacing device is again noted. The lungs are well aerated bilaterally. No bony abnormality is noted. IMPRESSION: No active cardiopulmonary disease. Electronically Signed   By: Inez Catalina M.D.   On: 06/17/2016 20:38   Ct Chest Wo Contrast  Result Date: 06/18/2016 CLINICAL DATA:  Lethargy and cough for 2  days EXAM: CT CHEST WITHOUT CONTRAST TECHNIQUE: Multidetector CT imaging of the chest was performed following the standard protocol without IV contrast. COMPARISON:  06/17/2016, CT 04/17/2016 FINDINGS: Cardiovascular: Limited evaluation without intravenous contrast. Aortic atherosclerosis. No aneurysmal dilatation. Coronary artery calcification. Partially visualized cardiac pacing leads. Mild cardiomegaly. No large pericardial effusion. Mediastinum/Nodes: Midline trachea. Few prominent sub- carinal and periesophageal lymph nodes, measuring up to 13 mm. Esophagus within normal limits. Esophagus Lungs/Pleura: Mild subpleural fibrosis. No focal consolidation, pleural effusion, or pneumothorax. Upper Abdomen: No acute abnormality. 9.6 cm cyst in the mid to upper right kidney. Vague hypodensity in the upper pole of the left kidney also consistent with a cyst Musculoskeletal: Degenerative changes of the spine. No acute or suspicious bone lesion. IMPRESSION: 1. Mild subpleural fibrosis. No acute pulmonary infiltrates are visualized. 2. Mild nonspecific mediastinal adenopathy 3. Cardiomegaly 4. Bilateral kidney cysts, measuring up to 9.6 cm on the right. Electronically Signed   By: Donavan Foil M.D.   On: 06/18/2016 00:26   US Renal  Result Date: 06/28/2016 CLINICAL DATA:  Renal failure. EXAM: RENAL / URINARY TRACT ULTRASOUND COMPLETE COMPARISON:  CT scan Jun 26, 2016 FINDINGS: Right Kidney: Length: 10.3 cm.  8.5 cm cyst.  Mild cortical thinning. Left Kidney: Length: 10.1 cm. Echogenicity within normal limits. No mass or hydronephrosis visualized. Bladder: Appears normal for degree of bladder distention. IMPRESSION: Right renal cyst.  No acute abnormality. Electronically Signed   By: Dorise Bullion III M.D   On: 06/28/2016 16:25   Dg Chest Port 1 View  Result Date: 06/23/2016 CLINICAL DATA:  Central line placement. EXAM: PORTABLE CHEST 1 VIEW COMPARISON:  06/17/2016 FINDINGS: Right arm PICC has its tip in the  proximal right atrium. Withdrawal of 2 cm if this is desired to be above the right atrium. Pacemaker leads appear the same. Cardiomegaly. Chronic pulmonary scarring. IMPRESSION: Right arm PICC tip in the right atrium. Withdraw 2 cm to be at the SVC RA junction, if that is desired. Electronically Signed   By: Nelson Chimes M.D.   On: 06/23/2016 15:56   Dg Swallowing Func-speech Pathology  Result Date: 06/19/2016 Objective Swallowing Evaluation: Type of Study: MBS-Modified Barium Swallow Study Patient Details Name: TAVARION BABINGTON MRN: [id number] Date of Birth: 07/17/1924 Today's Date: 06/19/2016 Time: SLP Start Time (ACUTE ONLY): 1520-SLP Stop Time (ACUTE ONLY): 1545 SLP Time Calculation (min) (ACUTE ONLY): 25 min Past Medical History: Past Medical History: Diagnosis Date . Anxiety  . Arthritis   R knee,  . CKD (chronic kidney disease)  . Coronary artery disease  . Diabetes mellitus without complication ([place])  . Dysrhythmia   PAF;,sss s/p St[employer] Jude PPM . Hyperlipidemia  . Hypertension  . MI (myocardial infarction) ([place]) 2014 . Presence of permanent cardiac pacemaker  Past Surgical History: Past Surgical History: Procedure Laterality Date . CHOLECYSTECTOMY   . CORONARY ANGIOPLASTY    mLAD '13, RCA DES 03/2013 . EXCISIONAL TOTAL KNEE ARTHROPLASTY WITH ANTIBIOTIC SPACERS Right 04/20/2014  Procedure: IRRIGATION AND DEBRIDMENT RIGHT TOTAL KNEE REMOVE  ACL IMPLANTED AND PLACE  SPACER;  Surgeon: [name], MD;  Location: [facility];  Service: Orthopedics;  Laterality: Right; . HEMORROIDECTOMY   . KNEE SURGERY  2006 . PACEMAKER INSERTION   . PICC LINE PLACE PERIPHERAL ([place] [facility])  04/2014  R upper arm  . TOTAL KNEE ARTHROPLASTY WITH REVISION COMPONENTS Right 06/11/2014  Procedure: TOTAL KNEE ARTHROPLASTY WITH REVISION COMPONENTS REMOVE SPACER PLACE TKA;  Surgeon: [name], MD;  Location: [facility];  Service: Orthopedics;  Laterality: Right; HPI: pt admit to [facility] with AMS, cough- found to be bacterial septic.  PMH + for DM, MI, no CVA hx. CT  chest and CXR negative.  Swallow eval ordered due to pt having dysphagia.  Subjective: pt awake in chair Assessment / Plan / Recommendation CHL IP CLINICAL IMPRESSIONS 06/19/2016 Clinical Impression Pt presents with severe pharyngeal dysphagia - narrow pharynx and decreased tongue base retraction results in very poor epiglottic deflection and gross pharyngeal/vallecular residuals.  Pt requires multiple swallows (up to 5-6) with each bolus and still has vallecular residuals. He did not aspirate or penetrate surprisingly.  Residuals were much worse with solids/pudding than liquids.  Various strategies/postures including head turn left, multiple swallows, following solid with liquids mitigate dysphagia.  Pt did not fully clear vallecular space but can fortunately hock and expectorate to clear.  Pt reports his swallow ability during MBS is "normal" for him causing SLP to suspect baseline significant dysphagia that he has been managing.  Recommend strict precautions to mitigate aspiration risk and modify diet to full liquids. Using teach back, pt able to demonstrate multiple swallows, "hocking" and oral suctioning independently.  Will follow for po tolerance, readiness for dietary advancement.   SLP Visit Diagnosis Dysphagia, oropharyngeal phase (R13.12) Attention and concentration deficit following -- Frontal lobe and executive function deficit following -- Impact on safety and function Severe aspiration risk;Risk for inadequate nutrition/hydration   CHL IP TREATMENT RECOMMENDATION 06/19/2016 Treatment Recommendations Therapy as outlined in treatment plan below   Prognosis 06/19/2016 Prognosis for Safe Diet Advancement Fair Barriers to Reach Goals Time post onset Barriers/Prognosis Comment -- CHL IP DIET RECOMMENDATION 06/19/2016 SLP Diet Recommendations Nectar thick liquid;Thin liquid Liquid Administration via Cup;Straw Medication Administration (No Data) Compensations Slow rate;Small sips/bites;Follow solids with  liquid;Multiple dry swallows after each bite/sip Postural Changes Seated upright at 90 degrees;Remain semi-upright after after feeds/meals (Comment)   CHL IP OTHER RECOMMENDATIONS 06/19/2016 Recommended Consults -- Oral Care Recommendations Oral care before and after PO Other Recommendations --   CHL IP FOLLOW UP RECOMMENDATIONS 06/19/2016 Follow up Recommendations (No Data)   CHL IP FREQUENCY AND DURATION 06/19/2016 Speech Therapy Frequency (ACUTE ONLY) min 2x/week Treatment Duration 1 week      CHL IP ORAL PHASE 06/19/2016 Oral Phase WFL Oral - Pudding Teaspoon -- Oral - Pudding Cup -- Oral - Honey Teaspoon -- Oral - Honey Cup -- Oral - Nectar Teaspoon -- Oral - Nectar Cup WFL Oral - Nectar Straw WFL Oral - Thin Teaspoon -- Oral - Thin Cup WFL Oral - Thin Straw WFL Oral - Puree WFL Oral - Mech Soft WFL Oral - Regular WFL Oral - Multi-Consistency -- Oral - Pill -- Oral Phase - Comment --  CHL IP PHARYNGEAL PHASE 06/19/2016 Pharyngeal Phase Impaired Pharyngeal- Pudding Teaspoon -- Pharyngeal -- Pharyngeal- Pudding Cup -- Pharyngeal -- Pharyngeal- Honey Teaspoon -- Pharyngeal -- Pharyngeal- Honey Cup -- Pharyngeal -- Pharyngeal- Nectar Teaspoon -- Pharyngeal -- Pharyngeal- Nectar Cup Reduced pharyngeal peristalsis;Pharyngeal residue - valleculae;Reduced tongue base retraction Pharyngeal -- Pharyngeal- Nectar Straw Reduced pharyngeal  peristalsis;Reduced tongue base retraction;Pharyngeal residue - valleculae Pharyngeal -- Pharyngeal- Thin Teaspoon -- Pharyngeal -- Pharyngeal- Thin Cup Reduced epiglottic inversion;Reduced tongue base retraction;Reduced pharyngeal peristalsis Pharyngeal -- Pharyngeal- Thin Straw Reduced pharyngeal peristalsis;Reduced epiglottic inversion;Pharyngeal residue - valleculae Pharyngeal -- Pharyngeal- Puree Reduced epiglottic inversion;Reduced pharyngeal peristalsis;Pharyngeal residue - valleculae Pharyngeal -- Pharyngeal- Mechanical Soft -- Pharyngeal -- Pharyngeal- Regular Reduced pharyngeal  peristalsis;Reduced epiglottic inversion;Reduced tongue base retraction;Pharyngeal residue - valleculae Pharyngeal -- Pharyngeal- Multi-consistency -- Pharyngeal -- Pharyngeal- Pill -- Pharyngeal -- Pharyngeal Comment pt without awareness to mild leftover vallecular residuals, he is able to clear/expectorate with cues   CHL IP CERVICAL ESOPHAGEAL PHASE 06/19/2016 Cervical Esophageal Phase WFL Pudding Teaspoon -- Pudding Cup -- Honey Teaspoon -- Honey Cup -- Nectar Teaspoon -- Nectar Cup -- Nectar Straw -- Thin Teaspoon -- Thin Cup -- Thin Straw -- Puree -- Mechanical Soft -- Regular -- Multi-consistency -- Pill -- Cervical Esophageal Comment -- No flowsheet data found. Luanna Salk, [place] [facility] [place] [facility] SLP [phone]               Microbiology: Recent Results (from the past 240 hour(s))  C difficile quick scan w PCR reflex     Status: None   Collection Time: 06/26/16 11:47 AM  Result Value Ref Range Status   C Diff antigen NEGATIVE NEGATIVE Final   C Diff toxin NEGATIVE NEGATIVE Final   C Diff interpretation No C. difficile detected.  Final  Culture, Urine     Status: None   Collection Time: 06/27/16  3:41 PM  Result Value Ref Range Status   Specimen Description URINE, CLEAN CATCH  Final   Special Requests NONE  Final   Culture   Final    NO GROWTH Performed at [facility], [address], [place], [place] [address]    Report Status 06/28/2016 FINAL  Final  Culture, blood (Routine X 2) w Reflex to ID Panel     Status: None   Collection Time: 06/27/16  5:10 PM  Result Value Ref Range Status   Specimen Description BLOOD LEFT HAND  Final   Special Requests   Final    BOTTLES DRAWN AEROBIC AND ANAEROBIC Blood Culture adequate volume   Culture   Final    NO GROWTH 5 DAYS Performed at [facility], [place] [address] [place], [place] [address]    Report Status 07/03/2016 FINAL  Final  Culture, blood (Routine X 2) w Reflex to ID Panel     Status: None   Collection Time: 06/27/16  6:46 PM    Result Value Ref Range Status   Specimen Description BLOOD LEFT FOREARM  Final   Special Requests IN PEDIATRIC BOTTLE Blood Culture adequate volume  Final   Culture   Final    NO GROWTH 5 DAYS Performed at [facility], [place] [address] [place], [place] [address]    Report Status 07/03/2016 FINAL  Final     Labs: Basic Metabolic Panel:  Recent Labs Lab 07/02/16 0500 07/03/16 0503 07/04/16 0324 07/05/16 0323 07/06/16 0416  NA 135 138 139 137 138  K 4.0 4.1 4.2 4.2 4.4  CL 100* 102 106 106 108  CO2 23 25 24 22 22   GLUCOSE 117* 123* 105* 127* 106*  BUN 20 24* 22* 20 19  CREATININE 2.48* 2.75* 2.41* 2.20* 1.95*  CALCIUM 8.5* 8.7* 8.4* 8.6* 8.6*   Liver Function Tests: No results for input(s): AST, ALT, ALKPHOS, BILITOT, PROT, ALBUMIN in the last 168 hours. No  results for input(s): LIPASE, AMYLASE in the last 168 hours. No results for input(s): AMMONIA in the last 168 hours. CBC:  Recent Labs Lab 07/02/16 0500 07/03/16 0503 07/04/16 0324 07/05/16 0323 07/06/16 0416  WBC 11.2* 10.1 8.2 8.1 8.0  HGB 8.1* 7.9* 7.7* 8.1* 8.0*  HCT 25.2* 25.3* 24.7* 25.3* 26.6*  MCV 89.0 90.4 91.8 92.0 94.3  PLT 523* 580* 545* 536* 522*   Cardiac Enzymes: No results for input(s): CKTOTAL, CKMB, CKMBINDEX, TROPONINI in the last 168 hours. BNP: BNP (last 3 results) No results for input(s): BNP in the last 8760 hours.  ProBNP (last 3 results) No results for input(s): PROBNP in the last 8760 hours.  CBG:  Recent Labs Lab 07/05/16 0727 07/05/16 1140 07/05/16 1645 07/05/16 2226 07/06/16 0751  GLUCAP 122* 174* 143* 87 90       Signed:  [facility] Pager: [phone] 07/06/2016, 10:05 AM

## 2016-07-06 NOTE — Care Management Note (Signed)
Case Management Note  Patient Details  Name: Randy [name] MRN: [id number] Date of Birth: 06-09-1924  Subjective/Objective:                    Action/Plan:d/c SNF.   Expected Discharge Date:  07/06/16               Expected Discharge Plan:  Skilled Nursing Facility  In-House Referral:  Clinical Social Work  Discharge planning Services  CM Consult  Post Acute Care Choice:    Choice offered to:     DME Arranged:    DME Agency:     HH Arranged:    [place] Agency:     Status of Service:  Completed, signed off  If discussed at [employer], dates discussed:    Additional Comments:  Dessa Phi, RN 07/06/2016, 11:02 AM

## 2016-07-06 NOTE — Clinical Social Work Placement (Signed)
Patient received and accepted bed offer at [facility]. PTAR contacted, family aware. Packet Complete. Patient's RN can call report to [phone], ask for [facility].   CLINICAL SOCIAL WORK PLACEMENT  NOTE  Date:  07/06/2016  Patient Details  Name: Randy [name] MRN: [id number] Date of Birth: September 08, 1924  Clinical Social Work is seeking post-discharge placement for this patient at the [place] level of care (*CSW will initial, date and re-position this form in  chart as items are completed):  Yes   Patient/family provided with [place] Work Department's list of facilities offering this level of care within the geographic area requested by the patient (or if unable, by the patient's family).  Yes   Patient/family informed of their freedom to choose among providers that offer the needed level of care, that participate in Medicare, Medicaid or managed care program needed by the patient, have an available bed and are willing to accept the patient.  Yes   Patient/family informed of [facility]'s ownership interest in [facility] and [facility], as well as of the fact that they are under no obligation to receive care at these facilities.  PASRR submitted to EDS on       PASRR number received on       Existing PASRR number confirmed on 06/30/16     FL2 transmitted to all facilities in geographic area requested by pt/family on 06/30/16     FL2 transmitted to all facilities within larger geographic area on       Patient informed that his/her managed care company has contracts with or will negotiate with certain facilities, including the following:        Yes   Patient/family informed of bed offers received.  Patient chooses bed at Other - please specify in the comment section below: ([facility] )     Physician recommends and patient chooses bed at      Patient to be transferred to Other - please specify in the comment  section below: ([facility]) on 07/06/16.  Patient to be transferred to facility by PTAR     Patient family notified on 07/06/16 of transfer.  Name of family member notified:  Ranae Pila     PHYSICIAN       Additional Comment:    _______________________________________________ Burnis Medin, LCSW 07/06/2016, 1:16 PM

## 2016-07-06 NOTE — Progress Notes (Signed)
PATIENT ID: GWENDOLYN NISHI  MRN: [id number]  DOB/AGE:  September 30, 1924 / 83 y.o.  14 Days Post-Op Procedure(s): aborted TEE    PROGRESS NOTE Subjective:   Patient is alert, oriented, no Nausea, no Vomiting, yes passing gas, yes Bowel Movement. Taking PO well with thickened liquids. Denies SOB, Chest or Calf Pain. Using Incentive Spirometer, PAS in place. Ambulate  -  Pt is non-ambulatory, Patient reports pain as mild,     Objective: Vital signs in last 24 hours: Temp:  [97.9 F (36.6 C)-98.7 F (37.1 C)] 97.9 F (36.6 C) (05/21 0541) Pulse Rate:  [68-79] 70 (05/21 0541) Resp:  [16-19] 16 (05/21 0541) BP: (137)/(49-60) 137/54 (05/21 0541) SpO2:  [99 %-100 %] 99 % (05/21 0541) Weight:  [88.5 kg (195 lb)] 88.5 kg (195 lb) (05/21 0541)    Intake/Output from previous day: I/O last 3 completed shifts: In: 3782.9 [P.O.:1320; I.V.:2262.9; IV Piggyback:200] Out: 2650 [Urine:2650]   Intake/Output this shift: No intake/output data recorded.   LABORATORY DATA:  Recent Labs  07/05/16 0323  07/05/16 1645 07/05/16 2226 07/06/16 0416 07/06/16 0751  WBC 8.1  --   --   --  8.0  --   HGB 8.1*  --   --   --  8.0*  --   HCT 25.3*  --   --   --  26.6*  --   PLT 536*  --   --   --  522*  --   NA 137  --   --   --  138  --   K 4.2  --   --   --  4.4  --   CL 106  --   --   --  108  --   CO2 22  --   --   --  22  --   BUN 20  --   --   --  19  --   CREATININE 2.20*  --   --   --  1.95*  --   GLUCOSE 127*  --   --   --  106*  --   GLUCAP  --   < > 143* 87  --  90  INR 1.23  --   --   --  1.29  --   CALCIUM 8.6*  --   --   --  8.6*  --   < > = values in this interval not displayed.  Examination: Neurologically intact Neurovascular intact Sensation intact distally Intact pulses distally Dorsiflexion/Plantar flexion intact} No pain with palpation Assessment:   14 Days Post-Op Procedure(s): aborted TEE  83 year old man with significant and severe comorbidities including bilateral deep  DVTs in the femoral veins of both legs, dementia, history of sepsis last week that seeded his revision right total knee. Any surgery on this patient in and of itself would be life-threatening, and I am not sure he would heal the wound. If he survived.  Plan: Continue close monitoring of the right total knee. The fluid re-accumulates, we will again aspirate. Continue suppressive antibiotics. If pt discharges to SNF, he will need to follow up within 5-7 days in office with Dr. Mayer Camel.     Panagiotis Oelkers R 07/06/2016, 8:27 AM

## 2016-07-06 NOTE — Progress Notes (Signed)
  Speech Language Pathology Treatment: Dysphagia  Patient Details Name: Randy [name] MRN: [id number] DOB: 08/25/1924 Today's Date: 07/06/2016 Time: 5374-8270 SLP Time Calculation (min) (ACUTE ONLY): 16 min  Assessment / Plan / Recommendation Clinical Impression  Pt intake has been documented as 25% with no fevers and decreased WBC.  Pt reports good tolerance of po intake.  He was observed implementing swallow strategies-multiple swallows with min cues; required mod cues for awareness to wet vocal quality - throat clearing and expectoration with min cues. Teach back and written precautions posted in room helpful for pt to begin generalization.     Suspect pt's swallow has improved due to medical status changes.  Pt admits to premorbid "wet voice" for a few months - denies sudden onset.  SLP uncertain to his ability to provide historical information.  Regardless, pt is tolerating po diet.  Recommend follow up with SLP at SNF to maximize swallow rehab/function.     HPI HPI: 83 year old male admitted to [facility] with AMS, cough- found to be bacterial septic.  PMH + for DM, MI, no CVA hx. CT chest and CXR negative.  Swallow eval ordered due to pt having dysphagia.       SLP Plan  Continue with current plan of care       Recommendations  Liquids provided via: Cup;Straw Medication Administration: Crushed with puree Supervision: Full supervision/cueing for compensatory strategies Compensations: Slow rate;Small sips/bites;Multiple dry swallows after each bite/sip;Follow solids with liquid;Minimize environmental distractions;Other (Comment) (intermittent throat clear/expectorate especially if voice is gurgly) Postural Changes and/or Swallow Maneuvers: Seated upright 90 degrees;Upright 30-60 min after meal                Oral Care Recommendations: Oral care BID SLP Visit Diagnosis: Dysphagia, oropharyngeal phase (R13.12) Plan: Continue with current plan of care       [place], [place] [facility] SLP [phone]

## 2016-07-06 NOTE — Progress Notes (Signed)
NUTRITION NOTE  Pt being followed by this RD and was last seen 06/30/16 at which time he was eating fairly well and consuming ordered oral nutrition supplements (Boost Breeze BID and Magic Cup BID). He is on a Dysphagia 2, thin liquids diet and since last follow-up has mainly been eating 25-50% of meals and continues with consumption of oral nutrition supplements. Order and summary in for discharge to SNF. If pt unable to d/c, RD will continue to monitor for needs.    Randy Matin, MS, RD, LDN, [facility] Inpatient Clinical Dietitian Pager # [phone] After hours/weekend pager # [phone]

## 2016-07-06 NOTE — Care Management Important Message (Signed)
Important Message  Patient Details IM Letter given to [name]/Case Manager to present to Patient Name: ABHIJAY MORRISS MRN: [id number] Date of Birth: 07-May-1924   Medicare Important Message Given:  Yes    Kerin Salen 07/06/2016, 10:47 AMImportant Message  Patient Details  Name: PHILIPE LASWELL MRN: [id number] Date of Birth: 1924-12-20   Medicare Important Message Given:  Yes    Kerin Salen 07/06/2016, 10:47 AM

## 2016-07-06 NOTE — Progress Notes (Signed)
ANTICOAGULATION CONSULT NOTE   Pharmacy Consult enoxaparin/warfarin Indication: DVT, atrial fibrillation  No Known Allergies  Patient Measurements: Height: 5\' 11"  (180.3 cm) Weight: 195 lb (88.5 kg) IBW/kg (Calculated) : 75.3   Vital Signs: Temp: 97.9 F (36.6 C) (05/21 0541) Temp Source: Oral (05/21 0541) BP: 137/54 (05/21 0541) Pulse Rate: 70 (05/21 0541)  Labs:  Recent Labs  07/04/16 0324 07/05/16 0323 07/06/16 0416  HGB 7.7* 8.1* 8.0*  HCT 24.7* 25.3* 26.6*  PLT 545* 536* 522*  LABPROT 17.9* 15.6* 16.2*  INR 1.46 1.23 1.29  CREATININE 2.41* 2.20* 1.95*    Estimated Creatinine Clearance: 26.8 mL/min (A) (by C-G formula based on SCr of 1.95 mg/dL (H)).   Medical History: Past Medical History:  Diagnosis Date  . Anxiety   . Arthritis    R knee,   . CKD (chronic kidney disease)   . Coronary artery disease   . Diabetes mellitus without complication ([place])   . Dysrhythmia    PAF;,sss s/p [employer] PPM  . Hyperlipidemia   . Hypertension   . MI (myocardial infarction) ([place]) 2014  . Presence of permanent cardiac pacemaker     Assessment: 62 yoM admitted for sepsis secondary to MSSA/Enterococcus bacteremia, recurrent right prosthetic knee infection, groin cellulitis.  Lower extremity venous duplex completed on 06/24/2016 + for acute DVTs. Pt was initially placed on IV heparin, then transitioned to Apixaban 5/12, transitioned back to heparin infusion 5/17 given worsening renal function. No dose adjustment of Apixaban recommended in renal impairment in treatment of VTE. However patients with SCr > 2.5 mg/dL or CrCl < 25 ml/min were excluded from the clinical trials. On 5/18, asked to transitioned patient to warfarin with enoxaparin bridge in order to facilitate discharge to SNF soon.   - abd CT on 5/11 neg for bleed -- GI added PPI and signed off  Today, 07/06/2016: - SCr improved to 1.95 with CrCl ~ 27 ml/min - CBC: Hgb low, but stable, Pltc elevated  - INR slightly  increased to 1.29 from 1.23  Goal of Therapy:  INR 2-3 Anti-Xa level 0.6-1 units/ml 4hrs after LMWH dose given Monitor platelets by anticoagulation protocol? yes  Plan:   Continue Enoxaparin 1 mg/kg (90mg ) SQ q24h for CrCl < 30 ml/min for at least 5 day overlap with warfarin and until INR therapeutic x at least 24 hours.  Monitor renal function closely  Give higher dose of Warfarin 6 mg PO x 1   Upon discharge would dose warfarin 6 mg PO daily with close INR monitoring to determine what pt's maintenance dose as pt is a new warfarin start  Daily PT/INR  Monitor for s/sx of bleeding    Royetta Asal, PharmD, BCPS Pager [phone] 07/06/2016 10:03 AM

## 2016-07-21 ENCOUNTER — Inpatient Hospital Stay: Payer: Medicare Other | Admitting: Infectious Diseases

## 2016-12-21 ENCOUNTER — Other Ambulatory Visit: Payer: Self-pay | Admitting: Internal Medicine

## 2016-12-21 DIAGNOSIS — R1084 Generalized abdominal pain: Secondary | ICD-10-CM

## 2016-12-28 ENCOUNTER — Other Ambulatory Visit: Payer: Medicare Other

## 2017-10-29 ENCOUNTER — Inpatient Hospital Stay (HOSPITAL_COMMUNITY): Payer: Medicare Other

## 2017-10-29 ENCOUNTER — Emergency Department (HOSPITAL_COMMUNITY): Payer: Medicare Other

## 2017-10-29 ENCOUNTER — Inpatient Hospital Stay (HOSPITAL_COMMUNITY)
Admission: EM | Admit: 2017-10-29 | Discharge: 2017-11-16 | DRG: 064 | Disposition: E | Payer: Medicare Other | Attending: Internal Medicine | Admitting: Internal Medicine

## 2017-10-29 DIAGNOSIS — R0603 Acute respiratory distress: Secondary | ICD-10-CM | POA: Diagnosis not present

## 2017-10-29 DIAGNOSIS — F0280 Dementia in other diseases classified elsewhere without behavioral disturbance: Secondary | ICD-10-CM | POA: Diagnosis present

## 2017-10-29 DIAGNOSIS — Z86711 Personal history of pulmonary embolism: Secondary | ICD-10-CM

## 2017-10-29 DIAGNOSIS — Z515 Encounter for palliative care: Secondary | ICD-10-CM | POA: Diagnosis not present

## 2017-10-29 DIAGNOSIS — E871 Hypo-osmolality and hyponatremia: Secondary | ICD-10-CM | POA: Diagnosis not present

## 2017-10-29 DIAGNOSIS — E878 Other disorders of electrolyte and fluid balance, not elsewhere classified: Secondary | ICD-10-CM | POA: Diagnosis not present

## 2017-10-29 DIAGNOSIS — G8194 Hemiplegia, unspecified affecting left nondominant side: Secondary | ICD-10-CM | POA: Diagnosis not present

## 2017-10-29 DIAGNOSIS — J690 Pneumonitis due to inhalation of food and vomit: Secondary | ICD-10-CM | POA: Diagnosis not present

## 2017-10-29 DIAGNOSIS — E873 Alkalosis: Secondary | ICD-10-CM | POA: Diagnosis not present

## 2017-10-29 DIAGNOSIS — E8770 Fluid overload, unspecified: Secondary | ICD-10-CM

## 2017-10-29 DIAGNOSIS — I129 Hypertensive chronic kidney disease with stage 1 through stage 4 chronic kidney disease, or unspecified chronic kidney disease: Secondary | ICD-10-CM | POA: Diagnosis present

## 2017-10-29 DIAGNOSIS — I10 Essential (primary) hypertension: Secondary | ICD-10-CM | POA: Diagnosis not present

## 2017-10-29 DIAGNOSIS — G8191 Hemiplegia, unspecified affecting right dominant side: Secondary | ICD-10-CM | POA: Diagnosis not present

## 2017-10-29 DIAGNOSIS — L89159 Pressure ulcer of sacral region, unspecified stage: Secondary | ICD-10-CM | POA: Diagnosis not present

## 2017-10-29 DIAGNOSIS — I480 Paroxysmal atrial fibrillation: Secondary | ICD-10-CM | POA: Diagnosis present

## 2017-10-29 DIAGNOSIS — I361 Nonrheumatic tricuspid (valve) insufficiency: Secondary | ICD-10-CM | POA: Diagnosis not present

## 2017-10-29 DIAGNOSIS — E870 Hyperosmolality and hypernatremia: Secondary | ICD-10-CM | POA: Diagnosis not present

## 2017-10-29 DIAGNOSIS — I639 Cerebral infarction, unspecified: Secondary | ICD-10-CM | POA: Diagnosis not present

## 2017-10-29 DIAGNOSIS — E1151 Type 2 diabetes mellitus with diabetic peripheral angiopathy without gangrene: Secondary | ICD-10-CM | POA: Diagnosis present

## 2017-10-29 DIAGNOSIS — Z7901 Long term (current) use of anticoagulants: Secondary | ICD-10-CM

## 2017-10-29 DIAGNOSIS — Z79899 Other long term (current) drug therapy: Secondary | ICD-10-CM

## 2017-10-29 DIAGNOSIS — R29724 NIHSS score 24: Secondary | ICD-10-CM | POA: Diagnosis present

## 2017-10-29 DIAGNOSIS — I2510 Atherosclerotic heart disease of native coronary artery without angina pectoris: Secondary | ICD-10-CM | POA: Diagnosis present

## 2017-10-29 DIAGNOSIS — N179 Acute kidney failure, unspecified: Secondary | ICD-10-CM | POA: Diagnosis not present

## 2017-10-29 DIAGNOSIS — I63412 Cerebral infarction due to embolism of left middle cerebral artery: Secondary | ICD-10-CM | POA: Diagnosis present

## 2017-10-29 DIAGNOSIS — I63512 Cerebral infarction due to unspecified occlusion or stenosis of left middle cerebral artery: Secondary | ICD-10-CM | POA: Diagnosis not present

## 2017-10-29 DIAGNOSIS — Z950 Presence of cardiac pacemaker: Secondary | ICD-10-CM | POA: Diagnosis not present

## 2017-10-29 DIAGNOSIS — G8101 Flaccid hemiplegia affecting right dominant side: Secondary | ICD-10-CM | POA: Diagnosis present

## 2017-10-29 DIAGNOSIS — I6203 Nontraumatic chronic subdural hemorrhage: Secondary | ICD-10-CM | POA: Diagnosis present

## 2017-10-29 DIAGNOSIS — R1319 Other dysphagia: Secondary | ICD-10-CM

## 2017-10-29 DIAGNOSIS — I6201 Nontraumatic acute subdural hemorrhage: Secondary | ICD-10-CM | POA: Diagnosis present

## 2017-10-29 DIAGNOSIS — R330 Drug induced retention of urine: Secondary | ICD-10-CM | POA: Diagnosis not present

## 2017-10-29 DIAGNOSIS — Z66 Do not resuscitate: Secondary | ICD-10-CM | POA: Diagnosis not present

## 2017-10-29 DIAGNOSIS — H5347 Heteronymous bilateral field defects: Secondary | ICD-10-CM | POA: Diagnosis present

## 2017-10-29 DIAGNOSIS — Z9189 Other specified personal risk factors, not elsewhere classified: Secondary | ICD-10-CM

## 2017-10-29 DIAGNOSIS — E119 Type 2 diabetes mellitus without complications: Secondary | ICD-10-CM

## 2017-10-29 DIAGNOSIS — R471 Dysarthria and anarthria: Secondary | ICD-10-CM | POA: Diagnosis present

## 2017-10-29 DIAGNOSIS — Z794 Long term (current) use of insulin: Secondary | ICD-10-CM

## 2017-10-29 DIAGNOSIS — I4892 Unspecified atrial flutter: Secondary | ICD-10-CM | POA: Diagnosis present

## 2017-10-29 DIAGNOSIS — N183 Chronic kidney disease, stage 3 unspecified: Secondary | ICD-10-CM | POA: Diagnosis present

## 2017-10-29 DIAGNOSIS — R4701 Aphasia: Secondary | ICD-10-CM | POA: Diagnosis present

## 2017-10-29 DIAGNOSIS — I4891 Unspecified atrial fibrillation: Secondary | ICD-10-CM | POA: Diagnosis not present

## 2017-10-29 DIAGNOSIS — I495 Sick sinus syndrome: Secondary | ICD-10-CM | POA: Diagnosis present

## 2017-10-29 DIAGNOSIS — E1122 Type 2 diabetes mellitus with diabetic chronic kidney disease: Secondary | ICD-10-CM | POA: Diagnosis present

## 2017-10-29 DIAGNOSIS — E875 Hyperkalemia: Secondary | ICD-10-CM | POA: Diagnosis not present

## 2017-10-29 DIAGNOSIS — Z95820 Peripheral vascular angioplasty status with implants and grafts: Secondary | ICD-10-CM | POA: Diagnosis not present

## 2017-10-29 DIAGNOSIS — E860 Dehydration: Secondary | ICD-10-CM | POA: Diagnosis not present

## 2017-10-29 DIAGNOSIS — I6202 Nontraumatic subacute subdural hemorrhage: Secondary | ICD-10-CM | POA: Diagnosis not present

## 2017-10-29 DIAGNOSIS — I252 Old myocardial infarction: Secondary | ICD-10-CM

## 2017-10-29 DIAGNOSIS — Z87440 Personal history of urinary (tract) infections: Secondary | ICD-10-CM

## 2017-10-29 DIAGNOSIS — Z87891 Personal history of nicotine dependence: Secondary | ICD-10-CM

## 2017-10-29 DIAGNOSIS — R1310 Dysphagia, unspecified: Secondary | ICD-10-CM | POA: Diagnosis not present

## 2017-10-29 DIAGNOSIS — R29810 Facial weakness: Secondary | ICD-10-CM | POA: Diagnosis present

## 2017-10-29 DIAGNOSIS — T443X5A Adverse effect of other parasympatholytics [anticholinergics and antimuscarinics] and spasmolytics, initial encounter: Secondary | ICD-10-CM | POA: Diagnosis not present

## 2017-10-29 DIAGNOSIS — R627 Adult failure to thrive: Secondary | ICD-10-CM | POA: Diagnosis not present

## 2017-10-29 DIAGNOSIS — I351 Nonrheumatic aortic (valve) insufficiency: Secondary | ICD-10-CM

## 2017-10-29 DIAGNOSIS — Z86718 Personal history of other venous thrombosis and embolism: Secondary | ICD-10-CM

## 2017-10-29 DIAGNOSIS — Z7401 Bed confinement status: Secondary | ICD-10-CM

## 2017-10-29 DIAGNOSIS — J9601 Acute respiratory failure with hypoxia: Secondary | ICD-10-CM | POA: Diagnosis not present

## 2017-10-29 DIAGNOSIS — E876 Hypokalemia: Secondary | ICD-10-CM | POA: Diagnosis not present

## 2017-10-29 DIAGNOSIS — Z8249 Family history of ischemic heart disease and other diseases of the circulatory system: Secondary | ICD-10-CM

## 2017-10-29 DIAGNOSIS — Z955 Presence of coronary angioplasty implant and graft: Secondary | ICD-10-CM

## 2017-10-29 DIAGNOSIS — Z8673 Personal history of transient ischemic attack (TIA), and cerebral infarction without residual deficits: Secondary | ICD-10-CM

## 2017-10-29 DIAGNOSIS — R0602 Shortness of breath: Secondary | ICD-10-CM | POA: Diagnosis not present

## 2017-10-29 DIAGNOSIS — Y92230 Patient room in hospital as the place of occurrence of the external cause: Secondary | ICD-10-CM | POA: Diagnosis not present

## 2017-10-29 DIAGNOSIS — Z7189 Other specified counseling: Secondary | ICD-10-CM | POA: Diagnosis not present

## 2017-10-29 DIAGNOSIS — L89320 Pressure ulcer of left buttock, unstageable: Secondary | ICD-10-CM | POA: Diagnosis present

## 2017-10-29 DIAGNOSIS — Z9049 Acquired absence of other specified parts of digestive tract: Secondary | ICD-10-CM

## 2017-10-29 DIAGNOSIS — D181 Lymphangioma, any site: Secondary | ICD-10-CM | POA: Diagnosis present

## 2017-10-29 DIAGNOSIS — T17908D Unspecified foreign body in respiratory tract, part unspecified causing other injury, subsequent encounter: Secondary | ICD-10-CM

## 2017-10-29 DIAGNOSIS — R1312 Dysphagia, oropharyngeal phase: Secondary | ICD-10-CM | POA: Diagnosis not present

## 2017-10-29 DIAGNOSIS — Z96651 Presence of right artificial knee joint: Secondary | ICD-10-CM | POA: Diagnosis present

## 2017-10-29 DIAGNOSIS — G301 Alzheimer's disease with late onset: Secondary | ICD-10-CM | POA: Diagnosis not present

## 2017-10-29 DIAGNOSIS — E785 Hyperlipidemia, unspecified: Secondary | ICD-10-CM | POA: Diagnosis not present

## 2017-10-29 DIAGNOSIS — R339 Retention of urine, unspecified: Secondary | ICD-10-CM | POA: Diagnosis not present

## 2017-10-29 LAB — COMPREHENSIVE METABOLIC PANEL
ALT: 11 U/L (ref 0–44)
AST: 18 U/L (ref 15–41)
Albumin: 3.6 g/dL (ref 3.5–5.0)
Alkaline Phosphatase: 70 U/L (ref 38–126)
Anion gap: 12 (ref 5–15)
BUN: 25 mg/dL — AB (ref 8–23)
CHLORIDE: 104 mmol/L (ref 98–111)
CO2: 22 mmol/L (ref 22–32)
Calcium: 9.3 mg/dL (ref 8.9–10.3)
Creatinine, Ser: 1.67 mg/dL — ABNORMAL HIGH (ref 0.61–1.24)
GFR calc Af Amer: 40 mL/min — ABNORMAL LOW (ref >60)
GFR, EST NON AFRICAN AMERICAN: 34 mL/min — AB (ref >60)
Glucose, Bld: 194 mg/dL — ABNORMAL HIGH (ref 70–99)
Potassium: 4.3 mmol/L (ref 3.5–5.1)
SODIUM: 138 mmol/L (ref 135–145)
Total Bilirubin: 1 mg/dL (ref 0.3–1.2)
Total Protein: 7.1 g/dL (ref 6.5–8.1)

## 2017-10-29 LAB — DIFFERENTIAL
ABS IMMATURE GRANULOCYTES: 0 10*3/uL (ref 0.0–0.1)
BASOS ABS: 0.1 10*3/uL (ref 0.0–0.1)
BASOS PCT: 1 %
EOS ABS: 0.3 10*3/uL (ref 0.0–0.7)
Eosinophils Relative: 3 %
Immature Granulocytes: 0 %
Lymphocytes Relative: 37 %
Lymphs Abs: 3.6 10*3/uL (ref 0.7–4.0)
MONOS PCT: 8 %
Monocytes Absolute: 0.8 10*3/uL (ref 0.1–1.0)
NEUTROS PCT: 51 %
Neutro Abs: 5.1 10*3/uL (ref 1.7–7.7)

## 2017-10-29 LAB — RAPID URINE DRUG SCREEN, HOSP PERFORMED
Amphetamines: NOT DETECTED
Barbiturates: NOT DETECTED
Benzodiazepines: NOT DETECTED
Cocaine: NOT DETECTED
OPIATES: NOT DETECTED
TETRAHYDROCANNABINOL: NOT DETECTED

## 2017-10-29 LAB — I-STAT CHEM 8, ED
BUN: 30 mg/dL — AB (ref 8–23)
CALCIUM ION: 1.09 mmol/L — AB (ref 1.15–1.40)
Chloride: 104 mmol/L (ref 98–111)
Creatinine, Ser: 1.7 mg/dL — ABNORMAL HIGH (ref 0.61–1.24)
Glucose, Bld: 193 mg/dL — ABNORMAL HIGH (ref 70–99)
HEMATOCRIT: 44 % (ref 39.0–52.0)
HEMOGLOBIN: 15 g/dL (ref 13.0–17.0)
Potassium: 4.4 mmol/L (ref 3.5–5.1)
SODIUM: 139 mmol/L (ref 135–145)
TCO2: 25 mmol/L (ref 22–32)

## 2017-10-29 LAB — HEMOGLOBIN A1C
Hgb A1c MFr Bld: 8.8 % — ABNORMAL HIGH (ref 4.8–5.6)
Mean Plasma Glucose: 205.86 mg/dL

## 2017-10-29 LAB — CBC
HCT: 43.9 % (ref 39.0–52.0)
HEMOGLOBIN: 13.9 g/dL (ref 13.0–17.0)
MCH: 28.4 pg (ref 26.0–34.0)
MCHC: 31.7 g/dL (ref 30.0–36.0)
MCV: 89.6 fL (ref 78.0–100.0)
PLATELETS: 328 10*3/uL (ref 150–400)
RBC: 4.9 MIL/uL (ref 4.22–5.81)
RDW: 14.8 % (ref 11.5–15.5)
WBC: 10 10*3/uL (ref 4.0–10.5)

## 2017-10-29 LAB — CBG MONITORING, ED
GLUCOSE-CAPILLARY: 195 mg/dL — AB (ref 70–99)
Glucose-Capillary: 198 mg/dL — ABNORMAL HIGH (ref 70–99)
Glucose-Capillary: 203 mg/dL — ABNORMAL HIGH (ref 70–99)
Glucose-Capillary: 163 mg/dL — ABNORMAL HIGH (ref 70–99)

## 2017-10-29 LAB — LIPID PANEL
Cholesterol: 181 mg/dL (ref 0–200)
HDL: 29 mg/dL — ABNORMAL LOW (ref >40)
LDL Cholesterol: 108 mg/dL — ABNORMAL HIGH (ref 0–99)
Total CHOL/HDL Ratio: 6.2 RATIO
Triglycerides: 219 mg/dL — ABNORMAL HIGH (ref <150)
VLDL: 44 mg/dL — ABNORMAL HIGH (ref 0–40)

## 2017-10-29 LAB — TSH: TSH: 3.31 u[IU]/mL (ref 0.350–4.500)

## 2017-10-29 LAB — GLUCOSE, CAPILLARY: Glucose-Capillary: 164 mg/dL — ABNORMAL HIGH (ref 70–99)

## 2017-10-29 LAB — PROTIME-INR
INR: 2.04
PROTHROMBIN TIME: 22.9 s — AB (ref 11.4–15.2)

## 2017-10-29 LAB — ETHANOL: Alcohol, Ethyl (B): <10 mg/dL (ref <10)

## 2017-10-29 LAB — I-STAT TROPONIN, ED: TROPONIN I, POC: 0.05 ng/mL (ref 0.00–0.08)

## 2017-10-29 LAB — APTT: APTT: 37 s — AB (ref 24–36)

## 2017-10-29 MED ORDER — IOPAMIDOL (ISOVUE-370) INJECTION 76%
100.0000 mL | Freq: Once | INTRAVENOUS | Status: AC | PRN
  Administered 2017-10-29: 100 mL via INTRAVENOUS

## 2017-10-29 MED ORDER — SENNOSIDES-DOCUSATE SODIUM 8.6-50 MG PO TABS: 1.0000 | ORAL_TABLET | Freq: Every evening | ORAL | Status: DC | PRN

## 2017-10-29 MED ORDER — INSULIN ASPART 100 UNIT/ML ~~LOC~~ SOLN
0.0000 [IU] | SUBCUTANEOUS | Status: DC
  Administered 2017-10-29 (×2): 3 [IU] via SUBCUTANEOUS
  Administered 2017-10-30 (×2): 2 [IU] via SUBCUTANEOUS
  Administered 2017-10-30: 3 [IU] via SUBCUTANEOUS
  Administered 2017-10-30 (×2): 2 [IU] via SUBCUTANEOUS
  Administered 2017-10-31: 3 [IU] via SUBCUTANEOUS
  Administered 2017-10-31 (×2): 2 [IU] via SUBCUTANEOUS
  Administered 2017-10-31: 3 [IU] via SUBCUTANEOUS
  Administered 2017-10-31: 2 [IU] via SUBCUTANEOUS
  Administered 2017-11-01: 5 [IU] via SUBCUTANEOUS
  Administered 2017-11-01 – 2017-11-02 (×6): 3 [IU] via SUBCUTANEOUS
  Administered 2017-11-02: 2 [IU] via SUBCUTANEOUS
  Administered 2017-11-02: 3 [IU] via SUBCUTANEOUS
  Administered 2017-11-02 – 2017-11-05 (×9): 2 [IU] via SUBCUTANEOUS
  Administered 2017-11-05 (×2): 3 [IU] via SUBCUTANEOUS
  Administered 2017-11-06 (×2): 2 [IU] via SUBCUTANEOUS
  Filled 2017-10-29: qty 1

## 2017-10-29 MED ORDER — ATORVASTATIN CALCIUM 80 MG PO TABS: 80.0000 mg | ORAL_TABLET | Freq: Every day | ORAL | Status: DC

## 2017-10-29 MED ORDER — SODIUM CHLORIDE 0.9 % IV SOLN
INTRAVENOUS | Status: DC
  Administered 2017-10-29 – 2017-10-30 (×2): via INTRAVENOUS

## 2017-10-29 NOTE — Code Documentation (Signed)
84yo male arriving to [facility] via [place] at 1125. Patient from home where he was noted by his caregiver to have altered mental status. EMS called and transported the patient to the ED. Code stroke called on patient arrival. Patient to CT. Stroke team to the bedside. CT completed followed by CTA. NIHSS 24, see documentation for details and code stroke times Patient with right arm flaccid, bilateral leg weakness, right facial droop, right hemianopsia and global aphasia on exam. Patient on Coumadin with INR 2.04. Patient is contraindicated for tPA d/t INR. Patient is not a candidate for endovascular intervention. No acute stroke treatment at this time Bedside handoff with ED RN Tray.

## 2017-10-29 NOTE — ED Notes (Signed)
Activated code stroke with carelink @ 11:32 per [facility] Lennette Bihari

## 2017-10-29 NOTE — ED Triage Notes (Signed)
Pt arrived by [facility] from home where pt began having alterted mental status at 1030, LSN 1030. Pt became altered with right sided flaccid, right sided facial droop. Not following commands or talking. Making garbled noises. Airway intact, VSS. Pt is on coumadin. Code stroke called after arrival due to EMS getting vague story. Pt seen by Dr Billy Fischer.

## 2017-10-29 NOTE — ED Provider Notes (Signed)
[facility] EMERGENCY DEPARTMENT Provider Note   CSN: [id number] Arrival date & time: 11/08/2017  1125     History   Chief Complaint Chief Complaint  Patient presents with  . Code Stroke    HPI Randy [name] is a 84 y.o. male with a past medical history of CKD, hypertension, hyperlipidemia, prior MI, prior CVA, Afib on warfarin, who presents to ED for altered mental status.  EMS states that per son, patient began having difficulty speaking, right-sided weakness and facial droop at 10:30 AM.  Patient is normally able to speak without difficulty.  Son denies any residual effects from prior stroke.   Level 5 caveat secondary to altered mental status.  HPI  Past Medical History:  Diagnosis Date  . Anxiety   . Arthritis    R knee,   . CKD (chronic kidney disease)   . Coronary artery disease   . Diabetes mellitus without complication ([place])   . Dysrhythmia    PAF;,sss s/p St. Jude PPM  . Hyperlipidemia   . Hypertension   . MI (myocardial infarction) ([place]) 2014  . Presence of permanent cardiac pacemaker     Patient Active Problem List   Diagnosis Date Noted  . Acute on chronic renal failure ([place])   . Uncontrolled type 2 diabetes mellitus with hyperglycemia, without long-term current use of insulin ([place]) 06/27/2016  . Acute blood loss anemia   . Leg DVT (deep venous thromboembolism), acute, bilateral ([place]) 06/24/2016  . Acute renal failure superimposed on stage 3 chronic kidney disease ([place]) 06/24/2016  . Staphylococcus aureus bacteremia with sepsis ([place])   . Pacemaker infection ([place])   . Enterococcal bacteremia   . Late onset Alzheimer's disease without behavioral disturbance   . Pressure injury of skin 06/18/2016  . Cough 06/17/2016  . Leukocytosis 06/17/2016  . Pacemaker 06/17/2016  . 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 Acquired absence of knee joint following removal of joint prosthesis with presence of antibiotic-impregnated cement spacer  06/08/2014  . History of infection due to ESBL Escherichia coli   . Chronic kidney disease (CKD), stage III (moderate) ([place]) 04/20/2014  . Prosthetic joint infection ([place]) 04/20/2014  . Septic joint of right knee joint ([place]) 04/19/2014  . Pyelonephritis 03/11/2014  . Sepsis ([place]) 01/04/2014  . Diabetes mellitus without complication ([place])   . Diabetes mellitus ([place]) 12/21/2013  . Acute UTI 12/20/2013  . History of MI (myocardial infarction) 12/20/2013  . UTI (lower urinary tract infection) 12/20/2013  . Elevated brain natriuretic peptide (BNP) level 12/20/2013  . Hyperglycemia 12/20/2013  . Acute kidney injury ([place]) 12/20/2013  . Decubitus ulcer 12/20/2013  . History of DVT (deep vein thrombosis) 12/20/2013  . Hyperlipidemia   . Hypertension   . Anxiety   . Essential hypertension   . Weakness generalized     Past Surgical History:  Procedure Laterality Date  . CHOLECYSTECTOMY    . CORONARY ANGIOPLASTY     mLAD '13, RCA DES 03/2013  . EXCISIONAL TOTAL KNEE ARTHROPLASTY WITH ANTIBIOTIC SPACERS Right 04/20/2014   Procedure: IRRIGATION AND DEBRIDMENT RIGHT TOTAL KNEE REMOVE  ACL IMPLANTED AND PLACE SPACER;  Surgeon: [name], MD;  Location: [facility];  Service: Orthopedics;  Laterality: Right;  . HEMORROIDECTOMY    . KNEE SURGERY  2006  . PACEMAKER INSERTION    . PICC LINE PLACE PERIPHERAL ([place] HX)  04/2014   R upper arm   . TOTAL KNEE ARTHROPLASTY WITH REVISION COMPONENTS Right 06/11/2014   Procedure:  TOTAL KNEE ARTHROPLASTY WITH REVISION COMPONENTS REMOVE SPACER PLACE TKA;  Surgeon: [name], MD;  Location: [facility];  Service: Orthopedics;  Laterality: Right;        Home Medications    Prior to Admission medications   Medication Sig Start Date End Date Taking? Authorizing Provider  enoxaparin (LOVENOX) 100 MG/ML injection Inject 0.9 mLs (90 mg total) into the skin daily. 07/06/16   Isaac Bliss, Rayford Halsted, MD  insulin detemir (LEVEMIR) 100 UNIT/ML injection Inject 0.1 mLs  (10 Units total) into the skin 2 (two) times daily. 07/06/16   Isaac Bliss, Rayford Halsted, MD  pantoprazole (PROTONIX) 40 MG tablet Take 1 tablet (40 mg total) by mouth 2 (two) times daily before a meal. 07/06/16   Isaac Bliss, Rayford Halsted, MD  warfarin (COUMADIN) 6 MG tablet Take 1 tablet (6 mg total) by mouth daily. 07/06/16 07/06/17  Erline Hau, MD    Family History Family History  Problem Relation Age of Onset  . Heart attack Mother   . Heart attack Father     Social History Social History   Tobacco Use  . Smoking status: Former Smoker    Last attempt to quit: 06/07/1983    Years since quitting: 34.4  . Smokeless tobacco: Never Used  Substance Use Topics  . Alcohol use: No  . Drug use: No     Allergies   Patient has no known allergies.   Review of Systems Review of Systems  Unable to perform ROS: Mental status change  Neurological: Positive for facial asymmetry, speech difficulty and weakness.     Physical Exam Updated Vital Signs BP (!) 160/66   Pulse 63   Temp (!) 97.5 F (36.4 C) (Oral)   Resp 19   SpO2 99%   Physical Exam  Constitutional: He appears well-developed and well-nourished. No distress.  HENT:  Head: Normocephalic and atraumatic.  Nose: Nose normal.  Eyes: Conjunctivae and EOM are normal. Right eye exhibits no discharge. Left eye exhibits no discharge. No scleral icterus.  Neck: Normal range of motion. Neck supple.  Cardiovascular: Normal rate, regular rhythm, normal heart sounds and intact distal pulses. Exam reveals no gallop and no friction rub.  No murmur heard. Pulmonary/Chest: Effort normal and breath sounds normal. No respiratory distress.  Abdominal: Soft. Bowel sounds are normal. He exhibits no distension. There is no tenderness. There is no guarding.  Musculoskeletal: Normal range of motion. He exhibits no edema.  Neurological: He is alert.  Aphasic.  Facial droop noted on right side.  Weakness of right upper  extremity.  Skin: Skin is warm and dry. No rash noted.  Psychiatric: He has a normal mood and affect.  Nursing note and vitals reviewed.    ED Treatments / Results  Labs (all labs ordered are listed, but only abnormal results are displayed) Labs Reviewed  PROTIME-INR - Abnormal; Notable for the following components:      Result Value   Prothrombin Time 22.9 (*)    All other components within normal limits  APTT - Abnormal; Notable for the following components:   aPTT 37 (*)    All other components within normal limits  COMPREHENSIVE METABOLIC PANEL - Abnormal; Notable for the following components:   Glucose, Bld 194 (*)    BUN 25 (*)    Creatinine, Ser 1.67 (*)    GFR calc non Af Amer 34 (*)    GFR calc Af Amer 40 (*)    All other components within normal  limits  CBG MONITORING, ED - Abnormal; Notable for the following components:   Glucose-Capillary 198 (*)    All other components within normal limits  I-STAT CHEM 8, ED - Abnormal; Notable for the following components:   BUN 30 (*)    Creatinine, Ser 1.70 (*)    Glucose, Bld 193 (*)    Calcium, Ion 1.09 (*)    All other components within normal limits  CBG MONITORING, ED - Abnormal; Notable for the following components:   Glucose-Capillary 203 (*)    All other components within normal limits  URINE CULTURE  CBC  DIFFERENTIAL  ETHANOL  RAPID URINE DRUG SCREEN, HOSP PERFORMED  I-STAT TROPONIN, ED    EKG EKG Interpretation  Date/Time:  Friday October 29 2017 12:31:15 EDT Ventricular Rate:  63 PR Interval:    QRS Duration: 191 QT Interval:  501 QTC Calculation: 513 R Axis:   -59 Text Interpretation:  Paced rhythm Baseline wander in lead(s) V4 No significant change since last tracing Confirmed by Gareth Morgan [phone]) on 11/14/2017 12:48:10 PM   Radiology Ct Angio Head W Or Wo Contrast  Result Date: 10/26/2017 CLINICAL DATA:  Code stroke.  Possible left MCA infarct.  Aphasia EXAM: CT ANGIOGRAPHY HEAD AND  NECK TECHNIQUE: Multidetector CT imaging of the head and neck was performed using the standard protocol during bolus administration of intravenous contrast. Multiplanar CT image reconstructions and MIPs were obtained to evaluate the vascular anatomy. Carotid stenosis measurements (when applicable) are obtained utilizing NASCET criteria, using the distal internal carotid diameter as the denominator. CONTRAST:  149mL ISOVUE-370 IOPAMIDOL (ISOVUE-370) INJECTION 76% COMPARISON:  CT head without contrast of the same day. FINDINGS: CTA NECK FINDINGS Aortic arch: Atherosclerotic calcifications are present at the aortic arch without aneurysm. There is no stenosis at the great vessel origins. There is a common origin of the right common carotid artery and the innominate artery. Right carotid system: The right common carotid artery is within normal limits. Extensive atherosclerotic calcifications present at the right carotid bifurcation. The lumen is narrowed to 2 mm proximally without a significant stenosis relative to the more distal vessel. There is some tortuosity of the cervical right ICA without other significant stenosis. Left carotid system: The left common carotid artery demonstrates distal atherosclerotic changes along the wall. This extends through the right carotid bifurcation and into the left external carotid artery. The left ICA lumen is narrowed to 1.4 mm. This compares with 3.1 mm more distally. Mild narrowing is present in the mid left cervical ICA. Vertebral arteries: The right vertebral artery is occluded in V1 segment. The left vertebral artery originates from the left subclavian artery without significant stenosis. There is no significant stenosis to the left vertebral artery in the neck. There is no significant reconstitution of the right vertebral artery in the neck. Skeleton: Multilevel degenerative changes are present cervical spine. Vertebral body heights maintained. No focal lytic or blastic  lesions present. Alignment is anatomic. Other neck: Soft tissues the neck are otherwise unremarkable. Thyroid is within normal limits. No significant adenopathy is present. Salivary glands are normal. Upper chest: Chronic interstitial lung changes are present bilaterally. No focal nodule, mass, or airspace disease is present. Review of the MIP images confirms the above findings CTA HEAD FINDINGS Anterior circulation: The right internal carotid artery demonstrates atherosclerotic changes through the cavernous segment. The artery is relatively small in caliber. There is no significant focal stenosis relative to the more distal vessel. The left internal carotid artery demonstrates calcifications in  the cavernous segment. There is a high-grade stenosis at the left ophthalmic segment more normal caliber of the ICA terminus. The right M1 segment is within normal limits. The right A1 segment is dominant. The anterior communicating artery is patent. Proximal left M1 segment is within normal limits. There is an occlusion of the distal left M1 segment a posterior left M2 branch is reconstituted. There is a stark paucity of collateral vessels in the left MCA territory. The right MCA is intact. Proximal M2 segments are visualized. There is moderate medium and small vessel disease. There is asymmetric attenuation of distal right ACA branch vessels. Posterior circulation: The left vertebral artery is the dominant vessel. There is some reconstitution of the distal right vertebral artery to the dura. This is likely retrograde. The right PICA is visualized. Basilar artery is normal. Both posterior cerebral arteries originate from basilar tip. Proximal PCA vessels demonstrate some irregularity without focal stenosis or occlusion. Bilateral PCA branch vessels are visualized. Venous sinuses: The dural sinuses are patent. The straight sinus deep cerebral veins are intact. Cortical veins are unremarkable. Anatomic variants: None Review  of the MIP images confirms the above findings IMPRESSION: 1. Emergent large vessel occlusion involving the left distal M1 segment with poor collaterals. 2. There is minimal reconstitution of posterior inferior left M2 branches. No other significant collateralization is evident. 3. Moderate to high-grade left paraophthalmic artery stenosis. 4. Approximately 60% stenosis of the proximal left internal carotid artery. 5. Moderate medium and distal small vessel disease in the right MCA territory without a significant proximal stenosis or occlusion. 6. Moderate stenosis of distal right A3 branches. 7. Atherosclerotic changes involving the right carotid bifurcation and cavernous right internal carotid artery without significant stenoses. 8. Atherosclerotic changes at the aortic arch without significant stenosis. 9. Interstitial lung changes noted. The above was relayed via text pager to Dr. Amie Portland on 10/22/2017 at 12:34 . Electronically Signed   By: San Morelle M.D.   On: 10/21/2017 12:52   Ct Angio Neck W Or Wo Contrast  Result Date: 10/31/2017 CLINICAL DATA:  Code stroke.  Possible left MCA infarct.  Aphasia EXAM: CT ANGIOGRAPHY HEAD AND NECK TECHNIQUE: Multidetector CT imaging of the head and neck was performed using the standard protocol during bolus administration of intravenous contrast. Multiplanar CT image reconstructions and MIPs were obtained to evaluate the vascular anatomy. Carotid stenosis measurements (when applicable) are obtained utilizing NASCET criteria, using the distal internal carotid diameter as the denominator. CONTRAST:  175mL ISOVUE-370 IOPAMIDOL (ISOVUE-370) INJECTION 76% COMPARISON:  CT head without contrast of the same day. FINDINGS: CTA NECK FINDINGS Aortic arch: Atherosclerotic calcifications are present at the aortic arch without aneurysm. There is no stenosis at the great vessel origins. There is a common origin of the right common carotid artery and the innominate artery.  Right carotid system: The right common carotid artery is within normal limits. Extensive atherosclerotic calcifications present at the right carotid bifurcation. The lumen is narrowed to 2 mm proximally without a significant stenosis relative to the more distal vessel. There is some tortuosity of the cervical right ICA without other significant stenosis. Left carotid system: The left common carotid artery demonstrates distal atherosclerotic changes along the wall. This extends through the right carotid bifurcation and into the left external carotid artery. The left ICA lumen is narrowed to 1.4 mm. This compares with 3.1 mm more distally. Mild narrowing is present in the mid left cervical ICA. Vertebral arteries: The right vertebral artery is occluded in V1  segment. The left vertebral artery originates from the left subclavian artery without significant stenosis. There is no significant stenosis to the left vertebral artery in the neck. There is no significant reconstitution of the right vertebral artery in the neck. Skeleton: Multilevel degenerative changes are present cervical spine. Vertebral body heights maintained. No focal lytic or blastic lesions present. Alignment is anatomic. Other neck: Soft tissues the neck are otherwise unremarkable. Thyroid is within normal limits. No significant adenopathy is present. Salivary glands are normal. Upper chest: Chronic interstitial lung changes are present bilaterally. No focal nodule, mass, or airspace disease is present. Review of the MIP images confirms the above findings CTA HEAD FINDINGS Anterior circulation: The right internal carotid artery demonstrates atherosclerotic changes through the cavernous segment. The artery is relatively small in caliber. There is no significant focal stenosis relative to the more distal vessel. The left internal carotid artery demonstrates calcifications in the cavernous segment. There is a high-grade stenosis at the left ophthalmic  segment more normal caliber of the ICA terminus. The right M1 segment is within normal limits. The right A1 segment is dominant. The anterior communicating artery is patent. Proximal left M1 segment is within normal limits. There is an occlusion of the distal left M1 segment a posterior left M2 branch is reconstituted. There is a stark paucity of collateral vessels in the left MCA territory. The right MCA is intact. Proximal M2 segments are visualized. There is moderate medium and small vessel disease. There is asymmetric attenuation of distal right ACA branch vessels. Posterior circulation: The left vertebral artery is the dominant vessel. There is some reconstitution of the distal right vertebral artery to the dura. This is likely retrograde. The right PICA is visualized. Basilar artery is normal. Both posterior cerebral arteries originate from basilar tip. Proximal PCA vessels demonstrate some irregularity without focal stenosis or occlusion. Bilateral PCA branch vessels are visualized. Venous sinuses: The dural sinuses are patent. The straight sinus deep cerebral veins are intact. Cortical veins are unremarkable. Anatomic variants: None Review of the MIP images confirms the above findings IMPRESSION: 1. Emergent large vessel occlusion involving the left distal M1 segment with poor collaterals. 2. There is minimal reconstitution of posterior inferior left M2 branches. No other significant collateralization is evident. 3. Moderate to high-grade left paraophthalmic artery stenosis. 4. Approximately 60% stenosis of the proximal left internal carotid artery. 5. Moderate medium and distal small vessel disease in the right MCA territory without a significant proximal stenosis or occlusion. 6. Moderate stenosis of distal right A3 branches. 7. Atherosclerotic changes involving the right carotid bifurcation and cavernous right internal carotid artery without significant stenoses. 8. Atherosclerotic changes at the aortic  arch without significant stenosis. 9. Interstitial lung changes noted. The above was relayed via text pager to Dr. Amie Portland on 11/08/2017 at 12:34 . Electronically Signed   By: San Morelle M.D.   On: 11/12/2017 12:52   Ct Head Code Stroke Wo Contrast  Result Date: 11/06/2017 CLINICAL DATA:  Code stroke. Aphasia. Focal neuro deficit, less than 6 hours, stroke suspected. Awake, but not following commands. EXAM: CT HEAD WITHOUT CONTRAST TECHNIQUE: Contiguous axial images were obtained from the base of the skull through the vertex without intravenous contrast. COMPARISON:  CT head without contrast 01/03/2014 FINDINGS: Brain: There is loss of normal gray-white differentiation in the anterior left insular cortex. Basal ganglia are intact. No focal cortical abnormalities are present. Moderate generalized atrophy is present. Brainstem and cerebellum are normal. No acute hemorrhage or mass lesion  is present. Ventricles are normal size. Bilateral mixed density extra-axial collections are present. Patient has had prominent extra-axial CSF space on the previous CT scan. There is now a thin layer more hyperdense material lateral to this bilaterally. This is more prominent right than left. The high-density component measures up to 8 mm on right 4 mm on the left. There is a significant midline shift. Vascular: No hyperdense vessel or unexpected calcification. Skull: Calvarium is intact. No focal lytic or blastic lesions are present. Sinuses/Orbits: Chronic wall thickening is present in the maxillary sinuses bilaterally. The paranasal sinuses and mastoid air cells are otherwise clear. ASPECTS [facility] Stroke Program Early CT Score) - Ganglionic level infarction (caudate, lentiform nuclei, internal capsule, insula, M1-M3 cortex): 6/7 - Supraganglionic infarction (M4-M6 cortex): 3/3 Total score (0-10 with 10 being normal): 9/10 IMPRESSION: 1. Loss of gray-white differentiation in the anterior left insular cortex may  represent early infarct. 2. Advanced atrophy and white matter disease without other acute cortical infarct. 3. Bilateral mixed density extra-axial fluid collections most likely represent chronic subdural hematomas, right greater than left. Acuity is indeterminate. The patient it have prominent extra-axial CSF spaces in the past. A more subtle hyperdense component is now present. 4. Evidence of chronic maxillary sinus disease without acute disease. 5. ASPECTS is 9/10 These results were called by telephone at the time of interpretation on 11/05/2017 at 12:11 pm to Dr. Rory Percy, who verbally acknowledged these results. Electronically Signed   By: San Morelle M.D.   On: 11/08/2017 12:12    Procedures Procedures (including critical care time)  CRITICAL CARE Performed by: Delia Heady   Total critical care time: 45 minutes  Critical care time was exclusive of separately billable procedures and treating other patients.  Critical care was necessary to treat or prevent imminent or life-threatening deterioration.  Critical care was time spent personally by me on the following activities: development of treatment plan with patient and/or surrogate as well as nursing, discussions with consultants, evaluation of patient's response to treatment, examination of patient, obtaining history from patient or surrogate, ordering and performing treatments and interventions, ordering and review of laboratory studies, ordering and review of radiographic studies, pulse oximetry and re-evaluation of patient's condition.   Medications Ordered in ED Medications  iopamidol (ISOVUE-370) 76 % injection 100 mL (100 mLs Intravenous Contrast Given 11/02/2017 1226)     Initial Impression / Assessment and Plan / ED Course  I have reviewed the triage vital signs and the nursing notes.  Pertinent labs & imaging results that were available during my care of the patient were reviewed by me and considered in my medical decision  making (see chart for details).  Clinical Course as of Oct 30 1315  Fri Oct 29, 2017  1158 Glucose-Capillary(!): 198 [HK]  1159 BP(!): 146/87 [HK]    Clinical Course User Index [HK] Delia Heady, PA-C   84 year old male with past medical history of CKD, hypertension, hyperlipidemia, prior CVA, A. fib on warfarin presents to ED for altered mental status.  According to EMS, son states that patient became aphasic with right-sided weakness noted.  He had no prior deficits from his prior stroke.  This occurred at 10:30 AM.  Right-sided facial droop and right-sided weakness noted on my exam patient alert but unable to answer questions.  Aphasic. Code stroke was called.  CTA of the head and neck showed ischemic stroke of the left distal M1 segment.  Patient has chronic subdural hematomas noted as well. Neurosurgery recommends discontinuing warfarin and  no reversal. IMTS to admit.  Patient discussed with and seen by my attending, Dr. Billy Fischer.  Portions of this note were generated with [employer]. Dictation errors may occur despite best attempts at proofreading.   Final Clinical Impressions(s) / ED Diagnoses   Final diagnoses:  Acute ischemic stroke [facility])    ED Discharge Orders    None       Delia Heady, PA-C 10/26/2017 [place], MD 10/30/17 [phone]

## 2017-10-29 NOTE — Progress Notes (Signed)
  Echocardiogram 2D Echocardiogram has been performed.  Johny Chess 10/18/2017, 5:46 PM

## 2017-10-29 NOTE — Consult Note (Signed)
Neurology Consultation  Reason for Consult: Code stroke Referring Physician: Dr. Billy Fischer  CC: Right-sided weakness, aphasia  History is obtained from: Chart review.  Attempted to call the son listed on the chart on cell phone x2-no answer, attempted to call the daughter and chart x1-no answer  HPI: Randy [name] is a 84 y.o. male past medical history of atrial fibrillation on Coumadin, at baseline unable to walk but able to move his upper extremities intact, coronary artery disease, chronic kidney disease, hyperlipidemia, hypertension brought in as an acute code stroke. Unclear last known normal.  Unable to reach family.  But according to ER, the best last known normal is 10:30 AM. Examined NIH score documented below consistent with a left MCA syndrome. Not a candidate for IV TPA due to INR being therapeutic. Not a candidate for endovascular thrombectomy due to modified Rankin for at baseline.   LKW: Unclear but to the best of history taking-10:30 AM. tpa given?: no, on Coumadin-therapeutic Premorbid modified Rankin scale (mRS): 4  ROS: Review of systems was unable to be obtained due to patient's mentation and a aphasia  Past Medical History:  Diagnosis Date  . Anxiety   . Arthritis    R knee,   . CKD (chronic kidney disease)   . Coronary artery disease   . Diabetes mellitus without complication ([place])   . Dysrhythmia    PAF;,sss s/p St. Jude PPM  . Hyperlipidemia   . Hypertension   . MI (myocardial infarction) ([place]) 2014  . Presence of permanent cardiac pacemaker      Family History  Problem Relation Age of Onset  . Heart attack Mother   . Heart attack Father    Social History:   reports that he quit smoking about 34 years ago. He has never used smokeless tobacco. He reports that he does not drink alcohol or use drugs.  Medications No current facility-administered medications for this encounter.   Current Outpatient Medications:  .  enoxaparin (LOVENOX) 100  MG/ML injection, Inject 0.9 mLs (90 mg total) into the skin daily., Disp: 0 Syringe, Rfl:  .  insulin detemir (LEVEMIR) 100 UNIT/ML injection, Inject 0.1 mLs (10 Units total) into the skin 2 (two) times daily., Disp: 10 mL, Rfl: 11 .  pantoprazole (PROTONIX) 40 MG tablet, Take 1 tablet (40 mg total) by mouth 2 (two) times daily before a meal., Disp: , Rfl:  .  warfarin (COUMADIN) 6 MG tablet, Take 1 tablet (6 mg total) by mouth daily., Disp: 30 tablet, Rfl: 11  Exam: Current vital signs: BP (!) 146/87 (BP Location: Right Arm)   Pulse 76   Temp (!) 97.5 F (36.4 C) (Oral)   Resp 16   SpO2 96%  Vital signs in last 24 hours: Temp:  [97.5 F (36.4 C)] 97.5 F (36.4 C) (09/13 1143) Pulse Rate:  [76] 76 (09/13 1143) Resp:  [16] 16 (09/13 1143) BP: (146)/(87) 146/87 (09/13 1143) SpO2:  [96 %] 96 % (09/13 1143) General: Well-developed well-nourished elderly man in no acute distress HEENT: Normocephalic Intermatic CVS: D6-L8 regular rate rhythm Respiratory: Breathing normally and saturating normal on room air 70s: No edema Neurological exam Patient awake, alert, not oriented and nonverbal. Speech is extremely dysarthric he is only able to mumble some noises but is completely incompressible. Cranial nerves: Pupils are equal round reactive to light, he has leftward gaze preference but is able to overcome and look to the right at times, right facial weakness. Motor exam: Right upper extremity is  flaccid, left upper extremity 5/5, both lower extremities are 1/5 at most. Sensory: Dense sensory loss on the right Difficult to assess coordination and gait  NIHSS 1a Level of Conscious.: 0 1b LOC Questions: 2 1c LOC Commands: 2 2 Best Gaze: 1 3 Visual: 2 4 Facial Palsy: 2 5a Motor Arm - left: 0 5b Motor Arm - Right: 4 6a Motor Leg - Left:3 6b Motor Leg - Right: 4 7 Limb Ataxia: 0 8 Sensory: 2 9 Best Language: 2 10 Dysarthria: 2 11 Extinct. and Inatten.: 2 TOTAL: 28   Labs I have  reviewed labs in epic and the results pertinent to this consultation are: CBC    Component Value Date/Time   WBC 10.0 11/06/2017 1145   RBC 4.90 11/06/2017 1145   HGB 13.9 11/13/2017 1145   HCT 43.9 11/05/2017 1145   PLT 328 10/23/2017 1145   MCV 89.6 10/20/2017 1145   MCH 28.4 11/15/2017 1145   MCHC 31.7 11/11/2017 1145   RDW 14.8 11/02/2017 1145   LYMPHSABS 3.6 11/12/2017 1145   MONOABS 0.8 11/02/2017 1145   EOSABS 0.3 11/14/2017 1145   BASOSABS 0.1 10/30/2017 1145    CMP     Component Value Date/Time   NA 138 07/06/2016 0416   K 4.4 07/06/2016 0416   CL 108 07/06/2016 0416   CO2 22 07/06/2016 0416   GLUCOSE 106 (H) 07/06/2016 0416   BUN 19 07/06/2016 0416   CREATININE 1.95 (H) 07/06/2016 0416   CREATININE 1.22 05/29/2014 1218   CALCIUM 8.6 (L) 07/06/2016 0416   PROT 5.8 (L) 06/27/2016 0546   ALBUMIN 1.9 (L) 06/27/2016 0546   AST 14 (L) 06/27/2016 0546   ALT 6 (L) 06/27/2016 0546   ALKPHOS 52 06/27/2016 0546   BILITOT 1.1 06/27/2016 0546   GFRNONAA 29 (L) 07/06/2016 0416   GFRAA 33 (L) 07/06/2016 0416    Imaging I have reviewed the images obtained:  CT-scan of the brain-no hyperdense vessel, chronic extra-axial CSF collections with mixed density concerning for some acute component of the subdural but not very clear, no large hypodensity but subtle hypodensity in the left insula.  CTA head and neck and perfusion pending at this time.  Assessment:  84 year old man brought in for assessment of right-sided facility and aphasia-code stroke activated upon arrival into the emergency room due to unclear last known normal Attempted to reach the patient's family multiple times without success. Patient on Coumadin for atrial flutter/fibrillation-INR therapeutic. Hence not a candidate for TPA. Attempting to obtain vessel imaging but he will not be a candidate for endovascular treatment due to modified Rankin score being 4 at baseline.  As far as the CT scan findings  concerning for acute on chronic subdural hematomas, I will recommend consulting neurosurgery regarding whether reversal of Coumadin is required. In either case, Coumadin should be held at this time but the reversal decision should be made after consultation with neurosurgery.  Impression: Left MCA territory ischemic stroke Possible acute on chronic subdural Fibrillation-on Coumadin therapeutic  Recommendations: -Admit to hospitalist  -Telemetry monitoring -Allow for permissive hypertension for the first 24-48h - only treat PRN if SBP >220 mmHg. Blood pressures can be gradually normalized to SBP<140 upon discharge. -MRI brain without contrast -Echocardiogram -HgbA1c, fasting lipid panel -Frequent neuro checks =-Hold antiplatelets and anticoagulation until okay by neurosurgery. -Atorvastatin 80 mg PO daily -Risk factor modification -I discussed the importance of exercise as well as smoking/alcohol/illicit drug use cessation. -PT consult, OT consult, Speech consult -Neurosurgical consultation on  opinion on whether reversal of Coumadin is required  Please page stroke NP/PA/MD (listed on AMION)  from 8am-4 pm as this patient will be followed by the stroke team at this point.  -- Amie Portland, MD [facility] Pager: [phone] If 7pm to 7am, please call on call as listed on AMION.   Addendum Spoke with the son over the Trellis Vanoverbeke at [phone].  He is currently on vacation in [place]. He confirmed the modified Rankin score. I told him in detail why we could not TPA him and or consider him for thrombectomy. He has asked to be updated on his father's progress after the consultations.  -- Amie Portland, MD [facility] Pager: [phone] If 7pm to 7am, please call on call as listed on AMION.   CRITICAL CARE ATTESTATION This patient is critically ill and at significant risk of neurological worsening, death and care requires constant  monitoring of vital signs, hemodynamics, respiratory, and cardiac monitoring. I spent 40  minutes of neurocritical care time performing neurological assessment, discussion with family, other specialists and medical decision making of high complexity in the care of  this patient.

## 2017-10-29 NOTE — H&P (Signed)
Date: 11/12/2017               Patient Name:  Randy [name] MRN: [id number]  DOB: [birth year] Age / Sex: 84 y.o., male   PCP: System, Pcp Not In         Medical Service: Internal Medicine Teaching Service         Attending Physician: Dr. Lenice Pressman, MD    First Contact: Randy [name] Pager: [phone]  Second Contact: Randy [name] Pager: [phone]       After Hours (After 5p/  First Contact Pager: [phone]  weekends / holidays): Second Contact Pager: [phone]   Chief Complaint: CVA  History of Present Illness:  Randy [name] is a 84 year old male with P MH of CKD, HTN, HLD, prior MI status post PE M, prior CVA, A. fib on warfarin presenting with AMS.  He was examined and evaluated at bedside in the ED.  He has significant dysarthria and is unable to answer questions.  He is able to understand and able to nod yes or no.  Currently endorsing no acute pain. Per chart review, he began to have dysarthria right-sided weakness and facial droop beginning this morning.  Normally the patient is able to speak without any difficulties.  At baseline unable to walk but able to move both his upper extremities.  Brought in with acute code stroke.  In the ED he wad brought by EMS and Acute code stroke was called. He underwent CT head and CTA and neurology was consulted. His INR was theapeutic on his home Coumadin which made him ineligible for TPA. CT head showed chronic subdural hematomas and is of early infarct in anterior left insular cortex.  CTA showed no large vessel occlusion and left distal M1.  As well as significant cerebral artery disease with multiple vessels with moderate stenosis.  Surgery was also consulted and recommended no intervention at this time.  Recommend holding warfarin without active reversal therapy.   Meds:  Lovenox 90mg  daily Coumadin 6mg  daily Insulin Detemir 10 units BID Protonix 40mg  BID  Allergies: Allergies as of 10/20/2017  . (No Known Allergies)    Past Medical History:  Diagnosis Date  . Anxiety   . Arthritis    R knee,   . CKD (chronic kidney disease)   . Coronary artery disease   . Diabetes mellitus without complication ([place])   . Dysrhythmia    PAF;,sss s/p St. Jude PPM  . Hyperlipidemia   . Hypertension   . MI (myocardial infarction) ([place]) 2014  . Presence of permanent cardiac pacemaker     Family History:  Family History  Problem Relation Age of Onset  . Heart attack Mother   . Heart attack Father    Social History: Patient unable to provide but chart review shows 24/7 caretaker. Not in room at ED  Review of Systems: A complete ROS was negative except as per HPI.   Physical Exam: Blood pressure (!) 162/68, pulse 62, temperature (!) 97.5 F (36.4 C), temperature source Oral, resp. rate 16, SpO2 100 %. Physical Exam  Constitutional: He is well-developed, well-nourished, and in no distress. No distress.  HENT:  Mouth/Throat: Oropharynx is clear and moist. No oropharyngeal exudate.  Eyes: Pupils are equal, round, and reactive to light. Conjunctivae and EOM are normal. No scleral icterus.  Neck: Normal range of motion. Neck supple. No JVD present.  Cardiovascular: Normal rate, regular rhythm, normal heart sounds and intact distal pulses.  Palpable pacemaker  on left sternal border  Pulmonary/Chest: Effort normal and breath sounds normal.  Abdominal: Soft. Bowel sounds are normal.  Lymphadenopathy:    He has no cervical adenopathy.  Neurological: He is alert. A cranial nerve deficit is present. Coordination abnormal.  Alert, Following directions, severe dysarthria. Right facial droop, Right hemianopsia, Bilateral upper and lower facial numbness. Right side hemiparesis. 3/5 LUE strength. 1/5 LLE strength.   Skin: Skin is warm and dry. He is not diaphoretic.  Right knee surgical scar    EKG: personally reviewed my interpretation is sinus rhythm, wide QRS, no ST changes, similar to prior  CXR: N/A  Assessment &  Plan by Problem: Randy [name] is a 84 yo M w/ PMH of T2DM, HTN, HLD, CAD s/p stent, A.fib s/p PPM, CVA, DVT, CKD3, and late onset Alzheimer's presenting with acute altered mental status, weakness and dysarthria found to have MCA occlusion on CTA. He has had prior history of coronary and cerebral artery disease and has multiple risk factors. However, fact that he was on therapuetic anticoagulation with Warfarin on admission is concerning.   CVA 2/2 left MCA occlusion  Presented with CVA. Onset time unclear. Neuro and neurosurgery on board. Right sided deficits and severe dysarthria. Was on Warfarin with therapeutic INR (2.04) on admission. NIHSS 24. Will need work up for what precipitated the occlusion. Most likely to be embolic stroke. - F/u Echo - Unclear if pacemaker is safe for MRI. Will attempt to reach out to patient's cardiologist (RandyRay Marya [name]) - Labs: HgbA1c, Lipid Panel, TSH - Neuro checks - Appreciate Neuro and Neuro surg recs - Start Atorvastatin 80 mg PO daily - Stop home Warfarin per Neurosurg - PT, OT, S&S eval - Telemetry  CKD stage 3 Baseline creatinine 1.8 Admission creatinine 1.67 - Trend BMP  Type 2 Diabetes Mellitus A1c 8.8 on admission - Glucose checks - Sliding scale aspart  DVT prophx: Coumadin but letting INR drop currently Diet: NPO til speech and swallow Bowel: Senokot Code: Full  Dispo: Admit patient to Inpatient with expected length of stay greater than 2 midnights.  Signed: Kristie [name], PGY1 Pager: [phone]

## 2017-10-29 NOTE — Consult Note (Addendum)
Neurosurgery Consultation  Reason for Consult: Subdural hematoma Referring Physician: Billy Fischer  CC: Aphasia  HPI: This is a 84 y.o. man that presents with acute right sided weakness and aphasia. Due to his aphasia, further information from the patient is not available. From review of his records, it sounds like is last known normal was 10:30 this morning. No tPA because he is therapeutic on warfarin, no thrombectomy due to elevated baseline MRS. After obtaining a CTH, it was noted that he may have some subdural blood components, neurosurgery was consulted for further recommendations.    ROS: A 14 point ROS was performed and is negative except as noted in the HPI, however limited due to patient's aphasia.  PMHx:  Past Medical History:  Diagnosis Date  . Anxiety   . Arthritis    R knee,   . CKD (chronic kidney disease)   . Coronary artery disease   . Diabetes mellitus without complication ([place])   . Dysrhythmia    PAF;,sss s/p St. Jude PPM  . Hyperlipidemia   . Hypertension   . MI (myocardial infarction) ([place]) 2014  . Presence of permanent cardiac pacemaker    FamHx:  Family History  Problem Relation Age of Onset  . Heart attack Mother   . Heart attack Father    SocHx:  reports that he quit smoking about 34 years ago. He has never used smokeless tobacco. He reports that he does not drink alcohol or use drugs.  Exam: Vital signs in last 24 hours: Temp:  [97.5 F (36.4 C)] 97.5 F (36.4 C) (09/13 1143) Pulse Rate:  [61-76] 62 (09/13 1315) Resp:  [16-25] 16 (09/13 1315) BP: (146-164)/(63-87) 162/68 (09/13 1315) SpO2:  [96 %-100 %] 100 % (09/13 1315) General: Awake, alert, lying in bed in NAD Head: normocephalic and atruamatic HEENT: neck supple Pulmonary: breathing room air comfortably, no evidence of increased work of breathing Psych: flat affect, non-reactive Abdomen: S NT ND Extremities: warm and well perfused x4 Neuro: Eyes open spontaneously, PERRL, gaze neutral,  receptive and motor aphasia, does not FC, unable to mimic on the left so unable to evaluate strength but at least 3/5, right strength 0/5 to painful stimulus, +right leg externally rotated   Assessment and Plan: 84 y.o. man with acute onset right hemiparesis and aphasia. [place] personally reviewed, which shows some hypodensity in the left insula, large subdural spaces with some moderate density areas, consistent with likely subacute on chronic subdural hematoma. CTA shows left M1 cutoff with reconstitution in the M2, but still a paucity of vascularity in the entire left MCA distribution. Likely incidental, but there is also a right vert occlusion at the V3 segment. -MRI pending. On the CTA, there are some ischemic changes but without CTP or DWI, unable to definitely say how large the penumbra is. As this tissue may be surviving off of the reconstituted M2, there is a chance that active reversal of his anticoagulation could lead to occlusion and completion of the entire MCA distribution infarct. Given that the subdural blood products appear subacute and not acute, recommend holding anticoagulation but not giving active reversal. Given that he has bilateral subdurals that did bleed in the past few weeks, despite his current (likely embolic) stroke, I do not recommend restarting therapeutic anticoagulation. -No acute neurosurgical intervention indicated at this time. Given the patient's advanced age, baseline poor modified Rankin, and significant deficit, I do not think that it would be appropriate to offer a decompressive craniectomy if the MRI shows a malignant  MCA infarct. -Please call with any concerns or questions  Judith Part, MD 10/28/2017 1:52 PM [facility]

## 2017-10-29 NOTE — ED Notes (Signed)
Pt CBG 203, RN notified.

## 2017-10-29 NOTE — ED Notes (Signed)
Changed pt brief and placed on CC

## 2017-10-29 NOTE — ED Notes (Signed)
CBG was taken (195). Pt tolerated well.

## 2017-10-30 ENCOUNTER — Inpatient Hospital Stay (HOSPITAL_COMMUNITY): Payer: Medicare Other

## 2017-10-30 DIAGNOSIS — I6203 Nontraumatic chronic subdural hemorrhage: Secondary | ICD-10-CM

## 2017-10-30 DIAGNOSIS — Z95820 Peripheral vascular angioplasty status with implants and grafts: Secondary | ICD-10-CM

## 2017-10-30 DIAGNOSIS — G301 Alzheimer's disease with late onset: Secondary | ICD-10-CM

## 2017-10-30 DIAGNOSIS — I252 Old myocardial infarction: Secondary | ICD-10-CM

## 2017-10-30 DIAGNOSIS — I4891 Unspecified atrial fibrillation: Secondary | ICD-10-CM

## 2017-10-30 DIAGNOSIS — E785 Hyperlipidemia, unspecified: Secondary | ICD-10-CM

## 2017-10-30 DIAGNOSIS — I129 Hypertensive chronic kidney disease with stage 1 through stage 4 chronic kidney disease, or unspecified chronic kidney disease: Secondary | ICD-10-CM

## 2017-10-30 DIAGNOSIS — Z7901 Long term (current) use of anticoagulants: Secondary | ICD-10-CM

## 2017-10-30 DIAGNOSIS — R471 Dysarthria and anarthria: Secondary | ICD-10-CM

## 2017-10-30 DIAGNOSIS — Z950 Presence of cardiac pacemaker: Secondary | ICD-10-CM

## 2017-10-30 DIAGNOSIS — E1122 Type 2 diabetes mellitus with diabetic chronic kidney disease: Secondary | ICD-10-CM

## 2017-10-30 DIAGNOSIS — Z8673 Personal history of transient ischemic attack (TIA), and cerebral infarction without residual deficits: Secondary | ICD-10-CM

## 2017-10-30 DIAGNOSIS — I10 Essential (primary) hypertension: Secondary | ICD-10-CM

## 2017-10-30 DIAGNOSIS — F0280 Dementia in other diseases classified elsewhere without behavioral disturbance: Secondary | ICD-10-CM

## 2017-10-30 DIAGNOSIS — N183 Chronic kidney disease, stage 3 (moderate): Secondary | ICD-10-CM

## 2017-10-30 DIAGNOSIS — I63512 Cerebral infarction due to unspecified occlusion or stenosis of left middle cerebral artery: Secondary | ICD-10-CM

## 2017-10-30 DIAGNOSIS — G8191 Hemiplegia, unspecified affecting right dominant side: Secondary | ICD-10-CM

## 2017-10-30 DIAGNOSIS — I6201 Nontraumatic acute subdural hemorrhage: Secondary | ICD-10-CM | POA: Diagnosis present

## 2017-10-30 DIAGNOSIS — I6202 Nontraumatic subacute subdural hemorrhage: Secondary | ICD-10-CM

## 2017-10-30 DIAGNOSIS — R29810 Facial weakness: Secondary | ICD-10-CM

## 2017-10-30 LAB — PROTIME-INR
INR: 2
PROTHROMBIN TIME: 22.5 s — AB (ref 11.4–15.2)

## 2017-10-30 LAB — BASIC METABOLIC PANEL
Anion gap: 14 (ref 5–15)
BUN: 21 mg/dL (ref 8–23)
CO2: 18 mmol/L — ABNORMAL LOW (ref 22–32)
Calcium: 8.9 mg/dL (ref 8.9–10.3)
Chloride: 107 mmol/L (ref 98–111)
Creatinine, Ser: 1.57 mg/dL — ABNORMAL HIGH (ref 0.61–1.24)
GFR calc Af Amer: 43 mL/min — ABNORMAL LOW (ref >60)
GFR calc non Af Amer: 37 mL/min — ABNORMAL LOW (ref >60)
Glucose, Bld: 143 mg/dL — ABNORMAL HIGH (ref 70–99)
Potassium: 4.6 mmol/L (ref 3.5–5.1)
Sodium: 139 mmol/L (ref 135–145)

## 2017-10-30 LAB — URINE CULTURE: CULTURE: NO GROWTH

## 2017-10-30 LAB — GLUCOSE, CAPILLARY
Glucose-Capillary: 109 mg/dL — ABNORMAL HIGH (ref 70–99)
Glucose-Capillary: 117 mg/dL — ABNORMAL HIGH (ref 70–99)
Glucose-Capillary: 129 mg/dL — ABNORMAL HIGH (ref 70–99)
Glucose-Capillary: 133 mg/dL — ABNORMAL HIGH (ref 70–99)
Glucose-Capillary: 138 mg/dL — ABNORMAL HIGH (ref 70–99)
Glucose-Capillary: 142 mg/dL — ABNORMAL HIGH (ref 70–99)

## 2017-10-30 MED ORDER — CHLORHEXIDINE GLUCONATE 0.12 % MT SOLN
15.0000 mL | Freq: Two times a day (BID) | OROMUCOSAL | Status: DC
  Administered 2017-10-30 – 2017-11-06 (×15): 15 mL via OROMUCOSAL
  Filled 2017-10-30 (×15): qty 15

## 2017-10-30 MED ORDER — HEPARIN SODIUM (PORCINE) 5000 UNIT/ML IJ SOLN
5000.0000 [IU] | Freq: Three times a day (TID) | INTRAMUSCULAR | Status: DC
  Administered 2017-10-30 – 2017-11-06 (×21): 5000 [IU] via SUBCUTANEOUS
  Filled 2017-10-30 (×20): qty 1

## 2017-10-30 MED ORDER — ASPIRIN EC 325 MG PO TBEC: 325.0000 mg | DELAYED_RELEASE_TABLET | Freq: Every day | ORAL | Status: DC

## 2017-10-30 MED ORDER — ORAL CARE MOUTH RINSE
15.0000 mL | Freq: Two times a day (BID) | OROMUCOSAL | Status: DC
  Administered 2017-10-30 – 2017-11-06 (×14): 15 mL via OROMUCOSAL

## 2017-10-30 MED ORDER — ASPIRIN 300 MG RE SUPP
300.0000 mg | Freq: Every day | RECTAL | Status: DC
  Administered 2017-10-30 – 2017-11-06 (×8): 300 mg via RECTAL
  Filled 2017-10-30 (×8): qty 1

## 2017-10-30 MED ORDER — ATORVASTATIN CALCIUM 40 MG PO TABS: 40.0000 mg | ORAL_TABLET | Freq: Every day | ORAL | Status: DC

## 2017-10-30 NOTE — Progress Notes (Signed)
Dr called wanting to know if we could do a MRI on Randy [name] with a pacemaker.  I looked up the patient and found no info other than he has a pacer.  Informed Dr that unless we get some information on pacemaker and leads we can not scan.

## 2017-10-30 NOTE — Evaluation (Addendum)
Physical Therapy Evaluation Patient Details Name: Randy [name] MRN: [id number] DOB: 27-Apr-1924 Today's Date: 10/30/2017   History of Present Illness  Pt is a 84 y.o. M with significant PMH of CKD, HTN, H LD, prior MI status, a.fib on Warfarin presenting with AMS. Has significant dysarthria. CT shows chronic subdural hematomas and early infarct in anteiror left insular cortex. CTA shows no large vessel occlusion.  Clinical Impression   Pt admitted with above diagnosis. Pt currently with functional limitations due to the deficits listed below (see PT Problem List). No family/caregiver present and patient unable to provide accurate history of PLOF and home set up/equipment available. Per chart review, patient is non ambulatory at baseline and patient does state he utilizes a wheelchair at home. Patient presenting with decreased functional mobility secondary to decreased range of motion, right hemiparesis, left upper and lower extremity weakness, left ankle contracture, poor balance, and decreased cognition. Requiring two person total assistance to progress to sitting edge of bed and to maintain upright positioning. Complaint of dizziness which did not resolve after ~5 minutes, VSS. Recommend maxi move transfer from bed to chair. Pt will benefit from skilled PT to increase their independence and safety with mobility to allow discharge to the venue listed below.      Follow Up Recommendations SNF;Supervision/Assistance - 24 hour    Equipment Recommendations  Other (comment)(defer)    Recommendations for Other Services   Palliative consult    Precautions / Restrictions Precautions Precautions: Fall Restrictions Weight Bearing Restrictions: No      Mobility  Bed Mobility Overal bed mobility: Needs Assistance Bed Mobility: Supine to Sit;Sit to Supine     Supine to sit: Total assist;+2 for physical assistance Sit to supine: +2 for physical assistance;+2 for safety/equipment   General bed  mobility comments: Total assistance for negotiation of legs to edge of bed and elevating trunk. No trunk activation noted   Transfers                 General transfer comment: deferred  Ambulation/Gait                Stairs            Wheelchair Mobility    Modified Rankin (Stroke Patients Only) Modified Rankin (Stroke Patients Only) Pre-Morbid Rankin Score: Severe disability Modified Rankin: Severe disability     Balance Overall balance assessment: Needs assistance Sitting-balance support: Feet supported;Bilateral upper extremity supported Sitting balance-Leahy Scale: Zero Sitting balance - Comments: Total assistance for maintaining upright with no trunk/core activation noted. tactile cueing for hand support. Maintained for ~5 minutes                                     Pertinent Vitals/Pain Pain Assessment: Faces Faces Pain Scale: No hurt    Home Living                   Additional Comments: Pt not able to contribute to history and no family/caregiver present. States he lives at home with family.    Prior Function Level of Independence: Needs assistance         Comments: Pt is an unreliable historian. States he self propels manual wheelchair and "stands with walker," to get into wheelchair.     Hand Dominance   Dominant Hand: Left    Extremity/Trunk Assessment   Upper Extremity Assessment Upper Extremity Assessment: RUE deficits/detail;LUE deficits/detail  RUE Deficits / Details: No active movement noted. PROM shoulder flexion to ~100 degrees LUE Deficits / Details: AROM shoulder flexion to 90 degrees. Weak handgrip    Lower Extremity Assessment Lower Extremity Assessment: RLE deficits/detail;LLE deficits/detail RLE Deficits / Details: No active movement noted. PROM knee flexion limited LLE Deficits / Details: Unable to formally assess due to cognition. Limited quad activation noted and ankle contracture with foot  ~20 deg from neutral        Communication   Communication: Expressive difficulties(dysarthric )  Cognition Arousal/Alertness: Awake/alert Behavior During Therapy: Flat affect Overall Cognitive Status: Impaired/Different from baseline Area of Impairment: Orientation;Attention;Following commands;Awareness                 Orientation Level: Disoriented to;Place;Time;Situation Current Attention Level: Focused   Following Commands: Follows one step commands inconsistently;Follows one step commands with increased time       General Comments: Very dysarthric and wet sounding vocal quality. Pt able to formulate mostly one word responses to questions. Not oriented to time, place, situation. Follows one step commands occasionally with direct cueing      General Comments      Exercises Other Exercises Other Exercises: PROM right hip/knee flexion/ankle dorsiflexion x 5   Assessment/Plan    PT Assessment Patient needs continued PT services  PT Problem List Decreased strength;Decreased range of motion;Decreased activity tolerance;Decreased balance;Decreased mobility;Decreased coordination;Decreased cognition;Decreased safety awareness;Impaired sensation;Impaired tone       PT Treatment Interventions Functional mobility training;Therapeutic activities;Therapeutic exercise;Balance training;Neuromuscular re-education;Cognitive remediation;Patient/family education;Wheelchair mobility training    PT Goals (Current goals can be found in the Care Plan section)  Acute Rehab PT Goals Patient Stated Goal: unable to participate in goal setting PT Goal Formulation: Patient unable to participate in goal setting Time For Goal Achievement: 11/13/17 Potential to Achieve Goals: Poor    Frequency Min 2X/week   Barriers to discharge        Co-evaluation               AM-PAC PT "6 Clicks" Daily Activity  Outcome Measure Difficulty turning over in bed (including adjusting bedclothes,  sheets and blankets)?: Unable Difficulty moving from lying on back to sitting on the side of the bed? : Unable Difficulty sitting down on and standing up from a chair with arms (e.g., wheelchair, bedside commode, etc,.)?: Unable Help needed moving to and from a bed to chair (including a wheelchair)?: Total Help needed walking in hospital room?: Total Help needed climbing 3-5 steps with a railing? : Total 6 Click Score: 6    End of Session   Activity Tolerance: Patient limited by fatigue Patient left: in bed;with call bell/phone within reach Nurse Communication: Mobility status;Need for lift equipment PT Visit Diagnosis: Other abnormalities of gait and mobility (R26.89);Hemiplegia and hemiparesis;Other symptoms and signs involving the nervous system (R29.898) Hemiplegia - Right/Left: Right Hemiplegia - dominant/non-dominant: Non-dominant    Time: 6659-9357 PT Time Calculation (min) (ACUTE ONLY): 32 min   Charges:   PT Evaluation $PT Eval Moderate Complexity: 1 Mod          Ellamae Sia, [place], DPT Acute Rehabilitation Services Pager [phone] Office [phone]   Willy Eddy 10/30/2017, 12:27 PM

## 2017-10-30 NOTE — Evaluation (Signed)
Clinical/Bedside Swallow Evaluation Patient Details  Name: Randy [name] MRN: [id number] Date of Birth: 1925/01/24  Today's Date: 10/30/2017 Time: SLP Start Time (ACUTE ONLY): 1212 SLP Stop Time (ACUTE ONLY): 1225 SLP Time Calculation (min) (ACUTE ONLY): 13 min  Past Medical History:  Past Medical History:  Diagnosis Date  . Anxiety   . Arthritis    R knee,   . CKD (chronic kidney disease)   . Coronary artery disease   . Diabetes mellitus without complication ([place])   . Dysrhythmia    PAF;,sss s/p [employer] Jude PPM  . Hyperlipidemia   . Hypertension   . MI (myocardial infarction) ([place]) 2014  . Presence of permanent cardiac pacemaker    Past Surgical History:  Past Surgical History:  Procedure Laterality Date  . CHOLECYSTECTOMY    . CORONARY ANGIOPLASTY     mLAD '13, RCA DES 03/2013  . EXCISIONAL TOTAL KNEE ARTHROPLASTY WITH ANTIBIOTIC SPACERS Right 04/20/2014   Procedure: IRRIGATION AND DEBRIDMENT RIGHT TOTAL KNEE REMOVE  ACL IMPLANTED AND PLACE SPACER;  Surgeon: [name], MD;  Location: [facility];  Service: Orthopedics;  Laterality: Right;  . HEMORROIDECTOMY    . KNEE SURGERY  2006  . PACEMAKER INSERTION    . PICC LINE PLACE PERIPHERAL ([place] [facility])  04/2014   R upper arm   . TOTAL KNEE ARTHROPLASTY WITH REVISION COMPONENTS Right 06/11/2014   Procedure: TOTAL KNEE ARTHROPLASTY WITH REVISION COMPONENTS REMOVE SPACER PLACE TKA;  Surgeon: [name], MD;  Location: [place];  Service: Orthopedics;  Laterality: Right;   HPI:  Randy [name] is a 84 y.o. male with a past medical history of CKD, hypertension, hyperlipidemia, prior MI, prior CVA, Afib on warfarin, who presents to ED for altered mental status, difficulty speaking, right-sided weakness and facial droop at 10:30 AM. Head CT Loss of gray-white differentiation in the anterior left insular cortex may represent early infarct, advanced atrophy and white matter disease without other acute cortical infarct, bilateral mixed density  extra-axial fluid collections most likely represent chronic subdural hematomas. No CXR documentation this admission. MBS 06/19/16 revealed multiple swallows, poor epiglottic deflection, gross pharyngeal residuals, no penetration/aspiration, full liquids recommended; diet upgrade to Dys 2, thin.    Assessment / Plan / Recommendation Clinical Impression  Suspect pt is aspirating given clinical observations via subjective assessment with thin and puree. Baseline audible congestion with decreased saliva management. Swallow intiation appeared uncoordinated, audible swallow and immediate and delayed throat clearing. Pt will need instrumental evaluation; not recommending po meds at this time, however educated RN pt could have ice chips after oral care intermittently with full supervision. Will follow up tomorrow for appropriateness for MBS.   SLP Visit Diagnosis: Dysphagia, unspecified (R13.10)    Aspiration Risk  Severe aspiration risk;Moderate aspiration risk    Diet Recommendation Ice chips PRN after oral care;NPO        Other  Recommendations Oral Care Recommendations: Oral care QID   Follow up Recommendations Skilled Nursing facility      Frequency and Duration min 2x/week  2 weeks       Prognosis Prognosis for Safe Diet Advancement: Fair      Swallow Study   General HPI: Randy [name] is a 84 y.o. male with a past medical history of CKD, hypertension, hyperlipidemia, prior MI, prior CVA, Afib on warfarin, who presents to ED for altered mental status, difficulty speaking, right-sided weakness and facial droop at 10:30 AM. Head CT Loss of gray-white differentiation in the anterior left insular  cortex may represent early infarct, advanced atrophy and white matter disease without other acute cortical infarct, bilateral mixed density extra-axial fluid collections most likely represent chronic subdural hematomas. No CXR documentation this admission. MBS 06/19/16 revealed multiple swallows, poor  epiglottic deflection, gross pharyngeal residuals, no penetration/aspiration, full liquids recommended; diet upgrade to Dys 2, thin.  Type of Study: Bedside Swallow Evaluation Previous Swallow Assessment: see HPI Diet Prior to this Study: NPO Temperature Spikes Noted: No Respiratory Status: Room air History of Recent Intubation: No Behavior/Cognition: Alert;Cooperative;Requires cueing Oral Cavity Assessment: Dry Oral Care Completed by SLP: Recent completion by staff Oral Cavity - Dentition: Poor condition;Missing dentition Vision: Functional for self-feeding Self-Feeding Abilities: Needs assist Patient Positioning: Upright in bed Baseline Vocal Quality: Low vocal intensity Volitional Cough: Weak    Oral/Motor/Sensory Function Overall Oral Motor/Sensory Function: Generalized oral weakness   Ice Chips Ice chips: Not tested   Thin Liquid Thin Liquid: Impaired Presentation: Cup Pharyngeal  Phase Impairments: Decreased hyoid-laryngeal movement;Other (comments);Throat Clearing - Immediate(audible swallow)    Nectar Thick Nectar Thick Liquid: Not tested   Honey Thick Honey Thick Liquid: Not tested   Puree Puree: Impaired Pharyngeal Phase Impairments: Multiple swallows   Solid     Solid: Not tested      Houston Siren 10/30/2017,1:20 PM  Orbie Pyo Colvin Caroli.Ed Risk analyst [phone] Office [phone]

## 2017-10-30 NOTE — Progress Notes (Addendum)
   Subjective:  Randy [name] is a 84 y.o. with PMH of CKD, HTN, HLD, prior MI s/p stent, prior CVA, A.fib s/p Pacemaker admit for L MCA CVA on hospital day 1  Randy [name] was examined and evaluated at bedside this a.m.  H his dysarthria had significantly improved since yesterday although he is still difficult to understand.  He was AAOx2 to name and location. Unable to clarify year. States he feels comfortable. Not in any acute pain. Able to follow directions but continuing to endorse significant right sided weakness. Denies any headache/ chest pain/ palpations/ dyspnea.  Objective:  Vital signs in last 24 hours: Vitals:   11/08/2017 2300 11/04/2017 2324 10/30/17 0315 10/30/17 0800  BP: (!) 168/70 (!) 157/67 (!) 145/66 (!) 137/56  Pulse: 63 63 63 61  Resp: 20 20 18 19   Temp: 98 F (36.7 C) 98.1 F (36.7 C) 98.2 F (36.8 C) 98 F (36.7 C)  TempSrc: Oral Oral Oral Oral  SpO2: 98% 99% 100% 99%  Weight:      Height:       Physical Exam  Constitutional: He is well-developed, well-nourished, and in no distress. No distress.  HENT:  Head: Normocephalic and atraumatic.  Mouth/Throat: Oropharynx is clear and moist. No oropharyngeal exudate.  Eyes: Pupils are equal, round, and reactive to light. No scleral icterus.  Right side hemianopsia  Neck: Normal range of motion. Neck supple.  Cardiovascular: Normal rate, regular rhythm, normal heart sounds and intact distal pulses.  Pulmonary/Chest: Effort normal and breath sounds normal.  Abdominal: Soft. Bowel sounds are normal.  Musculoskeletal: He exhibits no edema or deformity.  Neurological: He is alert.  Right side facial droop. Right sided upper and lower extremity strength 1/5. Left sided upper extremity strength 4/5. Left sided lower extremity strength 2/5. Left foot dorsi flexed  Skin: Skin is warm and dry. He is not diaphoretic.  Surgical scar on knee   Assessment/Plan:  Active Problems:   CVA (cerebral vascular accident)  [facility])  Randy [name] is a 84 year old male with PMH of HTN, HLD, CKD, CVA, prior MI status post stents, A. fib status post pacemaker admitted for left MCA CVA.  His neurological deficits appear much improved than yesterday.  Continuing to endorse right-sided weakness.  Able to speak and follow directions.  Still continuing to endorse some dysarthria.  Unclear of his baseline but appears to be improving.  Neuro on board will follow recommendations.  CVA 2/2 left MCA occlusion  Echo show no PFO. Unclear if pacemaker is MRI compatible.  Holding till able to be clarified with his cardiologist.  Cardiologist's office is not open during weekend. - Spoke with on-call cardiologist. States he will call back after speaking with patient's primary cardiologist (Dr.Ray Marya Fossa) - Neuro checks - Appreciate Neuro and Neuro surg recs - C/w Atorvastatin 80 mg PO daily - C/w hold home Warfarin per Neurosurg - Telemetry (Well paced on tele. D/C tele tomorrow if continuing to be well paced)  DVT prophx: Coumadin but letting INR drop currently (Currently 2.0) Diet: NPO til speech and swallow Bowel: Senokot Code: Full  Dispo: Anticipated discharge in approximately 2 day(s).   [name], MD 10/30/2017, 1:07 PM Pager: [phone]

## 2017-10-30 NOTE — Evaluation (Signed)
Occupational Therapy Evaluation Patient Details Name: Randy [name] MRN: [id number] DOB: [birth year] Today's Date: 10/30/2017    History of Present Illness Pt is a 84 y.o. M with significant PMH of CKD, HTN, H LD, prior MI status, a.fib on Warfarin presenting with AMS. Has significant dysarthria. CT shows chronic subdural hematomas and early infarct in anteiror left insular cortex. CTA shows no large vessel occlusion.   Clinical Impression   Pt admitted with the above diagnoses and presents with below problem list. Pt will benefit from continued acute OT to address the below listed deficits and maximize independence with basic ADLs prior to d/c to next venue. Unclear what was pt's PLOF with ADLs and home setup. Pt unable to provide full history and no family present on eval. Per chart review and confirmed by pt he does not ambulate at baseline. Pt reports he uses a rw to get into w/c himself. Pt is currently total A with ADLs, +2 for bed mobility.     Follow Up Recommendations  SNF    Equipment Recommendations  Other (comment)(defer to next venuie)    Recommendations for Other Services Speech consult     Precautions / Restrictions Precautions Precautions: Fall Restrictions Weight Bearing Restrictions: No      Mobility Bed Mobility Overal bed mobility: Needs Assistance Bed Mobility: Supine to Sit;Sit to Supine     Supine to sit: Total assist;+2 for physical assistance Sit to supine: +2 for physical assistance;+2 for safety/equipment   General bed mobility comments: Total assistance for negotiation of legs to edge of bed and elevating trunk. No trunk activation noted   Transfers                 General transfer comment: deferred    Balance Overall balance assessment: Needs assistance Sitting-balance support: Feet supported;Bilateral upper extremity supported Sitting balance-Leahy Scale: Zero Sitting balance - Comments: Total assistance for maintaining upright with  no trunk/core activation noted. tactile cueing for hand support. Maintained for ~5 minutes                                   ADL either performed or assessed with clinical judgement   ADL Overall ADL's : Needs assistance/impaired Eating/Feeding: NPO   Grooming: Total assistance   Upper Body Bathing: Total assistance   Lower Body Bathing: Total assistance   Upper Body Dressing : Total assistance   Lower Body Dressing: Total assistance   Toilet Transfer: Total assistance   Toileting- Clothing Manipulation and Hygiene: Total assistance         General ADL Comments: Pt completed bed mobility with +2 total assist     Vision         Perception     Praxis      Pertinent Vitals/Pain Pain Assessment: Faces Faces Pain Scale: No hurt     Hand Dominance Left   Extremity/Trunk Assessment Upper Extremity Assessment Upper Extremity Assessment: Generalized weakness;RUE deficits/detail;LUE deficits/detail RUE Deficits / Details: No active movement noted. PROM shoulder flexion to ~100 degrees LUE Deficits / Details: AROM shoulder flexion to 90 degrees. Weak handgrip   Lower Extremity Assessment Lower Extremity Assessment: Defer to PT evaluation RLE Deficits / Details: No active movement noted. PROM knee flexion limited LLE Deficits / Details: Unable to formally assess due to cognition. Limited quad activation noted and ankle contracture with foot ~20 deg from neutral        Communication  Communication Communication: Expressive difficulties   Cognition Arousal/Alertness: Awake/alert Behavior During Therapy: Flat affect Overall Cognitive Status: Impaired/Different from baseline Area of Impairment: Orientation;Attention;Following commands;Awareness                 Orientation Level: Disoriented to;Place;Time;Situation Current Attention Level: Focused   Following Commands: Follows one step commands inconsistently;Follows one step commands with  increased time       General Comments: Very dysarthric and wet sounding vocal quality. Pt able to formulate mostly one word responses to questions. Not oriented to time, place, situation. Follows one step commands occasionally with direct cueing   General Comments  Elevated HOB for better ability to clear throat. Spoke with RN about yonker and likely needing assist to clear throat.     Exercises Exercises: Other exercises Other Exercises Other Exercises: PROM right hip/knee flexion/ankle dorsiflexion x 5   Shoulder Instructions      Home Living                                   Additional Comments: Pt not able to contribute to history and no family/caregiver present. States he lives at home with family.      Prior Functioning/Environment Level of Independence: Needs assistance        Comments: Pt is an unreliable historian. States he self propels manual wheelchair and "stands with walker," to get into wheelchair.        OT Problem List: Decreased range of motion;Decreased strength;Decreased activity tolerance;Impaired balance (sitting and/or standing);Decreased coordination;Decreased cognition;Decreased safety awareness;Decreased knowledge of use of DME or AE;Decreased knowledge of precautions;Cardiopulmonary status limiting activity;Impaired UE functional use;Impaired tone      OT Treatment/Interventions: Self-care/ADL training;Neuromuscular education;DME and/or AE instruction;Therapeutic activities;Cognitive remediation/compensation;Patient/family education;Balance training    OT Goals(Current goals can be found in the care plan section) Acute Rehab OT Goals Patient Stated Goal: unable to participate in goal setting Time For Goal Achievement: 11/13/17 Potential to Achieve Goals: Good ADL Goals Pt Will Perform Grooming: sitting;with mod assist Pt Will Perform Upper Body Bathing: with mod assist;sitting Pt Will Perform Lower Body Bathing: with max  assist;sitting/lateral leans Additional ADL Goal #1: Pt will complete bed mobility at mod A +2 to prepare for EOB ADLs/transfers  OT Frequency: Min 2X/week   Barriers to D/C:            Co-evaluation PT/OT/SLP Co-Evaluation/Treatment: Yes Reason for Co-Treatment: Complexity of the patient's impairments (multi-system involvement);For patient/therapist safety;To address functional/ADL transfers   OT goals addressed during session: ADL's and self-care      AM-PAC PT "6 Clicks" Daily Activity     Outcome Measure Help from another person eating meals?: Total Help from another person taking care of personal grooming?: Total Help from another person toileting, which includes using toliet, bedpan, or urinal?: Total Help from another person bathing (including washing, rinsing, drying)?: Total Help from another person to put on and taking off regular upper body clothing?: Total Help from another person to put on and taking off regular lower body clothing?: Total 6 Click Score: 6   End of Session Nurse Communication: Other (comment)(would benefit from yonker and likely needs assist to use it)  Activity Tolerance: Patient limited by fatigue Patient left: in bed;with call bell/phone within reach  OT Visit Diagnosis: Muscle weakness (generalized) (M62.81);Other symptoms and signs involving cognitive function;Cognitive communication deficit (R41.841);Other abnormalities of gait and mobility (R26.89)  Time: 9802-2179 OT Time Calculation (min): 32 min Charges:  OT General Charges $OT Visit: 1 Visit OT Evaluation $OT Eval Moderate Complexity: [place], OT Acute Rehabilitation Services Pager: [phone] Office: [phone]   Hortencia Pilar 10/30/2017, 12:56 PM

## 2017-10-30 NOTE — Progress Notes (Signed)
SLP Cancellation Note  Patient Details Name: Randy [name] MRN: [id number] DOB: 03-11-1924   Cancelled treatment:        Pt down for CT. Will continue efforts.    [place] Siren 10/30/2017, 9:31 AM  Orbie Pyo Colvin Caroli.Ed Risk analyst [phone] weekend pager Office [phone]

## 2017-10-30 NOTE — Progress Notes (Addendum)
  Date: 10/30/2017  Patient name: Randy [name]  Medical record number: [id number]  Date of birth: 1924/08/11   I have seen and evaluated this patient and I have discussed the plan of care with the house staff. Please see their note for complete details. I concur with their findings with the following additions/corrections:   84 year old man with a significant history of CKD 3, MI status post PCI, prior stroke, A. fib with pacemaker, HTN, HLD, who presented with an acute left MCA stroke and acute on chronic subdural hemorrhage.  Today, he continues to have significant deficits with dysarthria and concern for aspiration risk on SLP evaluation.  He has been evaluated by neurology and neurosurgery yesterday.  At this time, we are holding anticoagulation given his acute on chronic subdural hemorrhage and trying to obtain an MRI.  However, unclear if his pacemaker is MRI compatible. We could consider CT perfusion scan instead of MRI, but unclear how this would change our management.  Not a candidate for TPA or neurosurgical intervention.  At this point, we just need to decide what the best medication regimen for him to balance the risks of bleeding versus clot extension.    PT and OT evaluation today recommending SNF at discharge, we will also consider a palliative care consultation depending on how he recovers in the next day or 2.  Lenice Pressman, M.D., Ph.D. 10/30/2017, 4:03 PM

## 2017-10-30 NOTE — Progress Notes (Addendum)
STROKE TEAM PROGRESS NOTE   SUBJECTIVE (INTERVAL HISTORY) No family is at the bedside.  Pt lying in bed, still has right hemiparesis and right facial droop.  Has pacemaker not able to have MRI.  CT repeat no acute abnormality.   OBJECTIVE Vitals:   11/02/2017 2324 10/30/17 0315 10/30/17 0800 10/30/17 1312  BP: (!) 157/67 (!) 145/66 (!) 137/56 (!) 141/66  Pulse: 63 63 61 63  Resp: 20 18 19 18   Temp: 98.1 F (36.7 C) 98.2 F (36.8 C) 98 F (36.7 C) 98.2 F (36.8 C)  TempSrc: Oral Oral Oral Oral  SpO2: 99% 100% 99% 98%  Weight:      Height:        CBC:  Recent Labs  Lab 10/25/2017 1145 11/13/2017 1146  WBC 10.0  --   NEUTROABS 5.1  --   HGB 13.9 15.0  HCT 43.9 44.0  MCV 89.6  --   PLT 328  --     Basic Metabolic Panel:  Recent Labs  Lab 10/24/2017 1145 11/06/2017 1146 10/30/17 0716  NA 138 139 139  K 4.3 4.4 4.6  CL 104 104 107  CO2 22  --  18*  GLUCOSE 194* 193* 143*  BUN 25* 30* 21  CREATININE 1.67* 1.70* 1.57*  CALCIUM 9.3  --  8.9    Lipid Panel:     Component Value Date/Time   CHOL 181 11/02/2017 1446   TRIG 219 (H) 11/04/2017 1446   HDL 29 (L) 11/15/2017 1446   CHOLHDL 6.2 11/06/2017 1446   VLDL 44 (H) 11/10/2017 1446   LDLCALC 108 (H) 10/28/2017 1446   HgbA1c:  Lab Results  Component Value Date   HGBA1C 8.8 (H) 11/15/2017   Urine Drug Screen:     Component Value Date/Time   LABOPIA NONE DETECTED 11/15/2017 1135   COCAINSCRNUR NONE DETECTED 10/17/2017 1135   LABBENZ NONE DETECTED 11/01/2017 1135   AMPHETMU NONE DETECTED 11/15/2017 1135   THCU NONE DETECTED 10/26/2017 1135   LABBARB NONE DETECTED 10/30/2017 1135    Alcohol Level     Component Value Date/Time   ETH <10 10/31/2017 1145    IMAGING  Ct Angio Head W Or Wo Contrast Ct Angio Neck W Or Wo Contrast 11/08/2017 IMPRESSION:  1. Emergent large vessel occlusion involving the left distal M1 segment with poor collaterals.  2. There is minimal reconstitution of posterior inferior left  M2 branches. No other significant collateralization is evident.  3. Moderate to high-grade left paraophthalmic artery stenosis.  4. Approximately 60% stenosis of the proximal left internal carotid artery.  5. Moderate medium and distal small vessel disease in the right MCA territory without a significant proximal stenosis or occlusion.  6. Moderate stenosis of distal right A3 branches.  7. Atherosclerotic changes involving the right carotid bifurcation and cavernous right internal carotid artery without significant stenoses.  8. Atherosclerotic changes at the aortic arch without significant stenosis.  9. Interstitial lung changes noted.    Ct Head Wo Contrast 10/30/2017 IMPRESSION:  1. No evidence of left-sided infarct. Today the insular cortex appears distinct and preserved.  2. Small age-indeterminate low right parietal infarct.  3. Stable mixed density subdural collections without shift or significant mass effect.    Ct Head Code Stroke Wo Contrast 11/04/2017 IMPRESSION:  1. Loss of gray-white differentiation in the anterior left insular cortex may represent early infarct.  2. Advanced atrophy and white matter disease without other acute cortical infarct.  3. Bilateral mixed density extra-axial fluid collections  most likely represent chronic subdural hematomas, right greater than left. Acuity is indeterminate. The patient it have prominent extra-axial CSF spaces in the past. A more subtle hyperdense component is now present.  4. Evidence of chronic maxillary sinus disease without acute disease.  5. ASPECTS is 9/10    Transthoracic Echocardiogram  11/11/2017 Study Conclusions  - Left ventricle: The cavity size was normal. There was moderate   concentric hypertrophy. Systolic function was normal. The   estimated ejection fraction was in the range of 50% to 55%. Wall   motion was normal; there were no regional wall motion   abnormalities. Doppler parameters are consistent with  abnormal   left ventricular relaxation (grade 1 diastolic dysfunction). - Ventricular septum: Septal motion showed abnormal function,   dyssynergy, and paradox. These changes are consistent with right   ventricular pacing. - Aortic valve: Right coronary cusp mobility was severely   restricted. There was mild stenosis. There was mild   regurgitation. Valve area (VTI): 1.66 cm^2. Valve area (Vmax):   1.43 cm^2. Valve area (Vmean): 1.46 cm^2. - Left atrium: The atrium was mildly to moderately dilated. - Right atrium: The atrium was mildly dilated. - Atrial septum: No defect or patent foramen ovale was identified.   Echo contrast study showed no right-to-left atrial level shunt,   at baseline or with provocation.    PHYSICAL EXAM Blood pressure (!) 141/66, pulse 63, temperature 98.2 F (36.8 C), temperature source Oral, resp. rate 18, height 6' (1.829 m), weight 80.3 kg, SpO2 98 %.  General - Well nourished, well developed, lethargic.  Ophthalmologic - fundi not visualized due to noncooperation.  Cardiovascular - Regular rate and rhythm, not in afib.  Neuro - awake but lethargic, orientated to self, place, but not to time or situation. Severe dysarthria, but able to name and repeat. Paucity of speech, following all commands. Left pupil irregular with surgical pupil not reactive to light, right pupil round 2.82mm, reactive to light. Visual field intact, blinking to visual threat bilaterally. No gaze preference. Right mild facial droop, tongue midline. LUE 4/5 and LLE 3-/5. However, RUE 0/5 and RLE withdraw to pain only. Sensation symmetrical. Right hand no ataxia. Gait not tested.     ASSESSMENT/PLAN Mr. DASH CARDARELLI is a 84 y.o. male with history of atrial fibrillation on Coumadin, PPM, at baseline unable to walk, coronary artery disease, DM, chronic kidney disease, hyperlipidemia, and hypertension presenting with Rt sided weakness and aphasia. He did not receive IV t-PA due to  anticoagulation.   Stroke: R MCA infarct due to left M1 distal occlusion, embolic pattern, likely due to afib with low normal INR vs. Left ICA stenosis  Resultant right facial droop, right hemiparesis  Initial CT head suspect left insular cortex infarct.  Bilateral chronic hygroma   Repeat CT head showed no acute abnormality. Small age-indeterminate low right parietal infarct.   MRI head - not performed - PPM  CTA H&N - left M1 distal occlusion with decreased branch filling. Approximately 60% stenosis of the proximal left ICA.  2D Echo - EF 50 - 55%. No cardiac source of emboli identified.   LDL - 108  HgbA1c - 8.8  INR 2.04->2.00  VTE prophylaxis - heparin subq  Diet - NPO  warfarin daily prior to admission, now on aspirin 325 mg daily or ASA PR.  Recommend continue Coumadin once p.o. access, INR goal increased to 2.5-3.0 for further stroke prevention.  Once INR at goal, aspirin can be discontinued.  Patient counseled  to be compliant with his antithrombotic medications  Ongoing aggressive stroke risk factor management  Therapy recommendations:  SNF  Disposition:  Pending  Afib on coumadin  INR 2.04-2.00  Currently on aspirin PR due to n.p.o. Status  Recommend to continue Coumadin once p.o. access, INR goal 2.5-3.0.  Once INR at goal, aspirin can be discontinued  Left carotid stenosis  CTA showed left ICA 60% stenosis  However, patient not a candidate for revascularization procedure.  Consider outpatient follow-up with vascular surgery if patient neurologically improves.  Hypertension  Stable Permissive hypertension (OK if <180/105) but gradually normalize in 5-7 days . Long-term BP goal normotensive  Hyperlipidemia  Lipid lowering medication PTA: none  LDL 108, goal < 70  Current lipid lowering medication: Lipitor 40 mg daily  Continue statin at discharge  Diabetes  HgbA1c 8.8, goal < 7.0  Uncontrolled  SSI  CBG monitoring  DM education  and PCP close follow-up  Other Stroke Risk Factors  Advanced age  Former cigarette smoker - quit   Coronary artery disease/MI   Other Active Problems  CKD 3 - Creatinine 1.57  PPM  Baseline not walking independently.  B/l hygroma - found on CT, could be due to chronic SDH but no acute findings.   Hospital day # 1  Neurology will sign off. Please call with questions. Pt will follow up with stroke clinic NP at [facility] in about 4 weeks. Thanks for the consult.  Rosalin Hawking, MD PhD Stroke Neurology 10/30/2017 5:00 PM  To contact Stroke Continuity provider, please refer to [email or web address] After hours, contact General Neurology

## 2017-10-31 ENCOUNTER — Inpatient Hospital Stay (HOSPITAL_COMMUNITY): Payer: Medicare Other

## 2017-10-31 ENCOUNTER — Other Ambulatory Visit: Payer: Self-pay

## 2017-10-31 DIAGNOSIS — R1310 Dysphagia, unspecified: Secondary | ICD-10-CM

## 2017-10-31 DIAGNOSIS — E8770 Fluid overload, unspecified: Secondary | ICD-10-CM

## 2017-10-31 LAB — BLOOD GAS, ARTERIAL
Acid-base deficit: 2.8 mmol/L — ABNORMAL HIGH (ref 0.0–2.0)
Bicarbonate: 20.3 mmol/L (ref 20.0–28.0)
DRAWN BY: 36277
O2 CONTENT: 2 L/min
O2 Saturation: 87.2 %
Patient temperature: 98.6
pCO2 arterial: 28.2 mmHg — ABNORMAL LOW (ref 32.0–48.0)
pH, Arterial: 7.47 — ABNORMAL HIGH (ref 7.350–7.450)
pO2, Arterial: 49.1 mmHg — ABNORMAL LOW (ref 83.0–108.0)

## 2017-10-31 LAB — GLUCOSE, CAPILLARY
Glucose-Capillary: 141 mg/dL — ABNORMAL HIGH (ref 70–99)
Glucose-Capillary: 154 mg/dL — ABNORMAL HIGH (ref 70–99)
Glucose-Capillary: 172 mg/dL — ABNORMAL HIGH (ref 70–99)
Glucose-Capillary: 178 mg/dL — ABNORMAL HIGH (ref 70–99)
Glucose-Capillary: 148 mg/dL — ABNORMAL HIGH (ref 70–99)

## 2017-10-31 LAB — PROTIME-INR
INR: 1.99
Prothrombin Time: 22.4 seconds — ABNORMAL HIGH (ref 11.4–15.2)

## 2017-10-31 MED ORDER — FUROSEMIDE 10 MG/ML IJ SOLN
40.0000 mg | Freq: Once | INTRAMUSCULAR | Status: AC
  Administered 2017-10-31: 40 mg via INTRAVENOUS
  Filled 2017-10-31: qty 4

## 2017-10-31 MED ORDER — FUROSEMIDE 10 MG/ML IJ SOLN
20.0000 mg | Freq: Once | INTRAMUSCULAR | Status: AC
  Administered 2017-10-31: 20 mg via INTRAVENOUS
  Filled 2017-10-31: qty 4

## 2017-10-31 NOTE — Progress Notes (Signed)
  Night team visit note:  We saw and evaluated Randy [name] after being  paged for abnormal upper airway sound. At bedside, patient was comfortable and confirmed that he is doing ok, has no difficulty breathing.  O2 sat is 96% at room air. On exam: Has diffuse Rhonchi and some upper extremities edema. He has received 125 ml/h IV fluid since yesterday. Is CKD III. Seems to be volume overload. We hold the IV fluid for tonight, to be reevaluated in the morning and decide about tolerable rate of maintenance IV fluid.   Linna Hoff, MD Internal Medicine PGY-1 12:44 AM:

## 2017-10-31 NOTE — Progress Notes (Signed)
  Date: 10/31/2017  Patient name: Randy [name]  Medical record number: [id number]  Date of birth: 1924/02/22   I have seen and evaluated this patient and I have discussed the plan of care with the house staff. Please see their note for complete details. I concur with their findings with the following additions/corrections:   84 year old man admitted with left MCA stroke.  Still with significant dysarthria and difficulty swallowing.  Per SLP evaluation today, not safe to swallow as he is at significant risk of aspiration.  Will need to reassess over the coming day or 2 and consider his goals of care and conversations with his family.  Without significant improvement, I suspect hospice may be appropriate.  Lenice Pressman, M.D., Ph.D. 10/31/2017, 4:38 PM

## 2017-10-31 NOTE — NC FL2 (Signed)
[place] LEVEL OF CARE SCREENING TOOL     IDENTIFICATION  Patient Name: Randy [name] Birthdate: 11-Apr-1924 Sex: male Admission Date (Current Location): 10/17/2017  [facility] and [place] Number:  Herbalist and Address:  [facility], [place] [address] [place], [place] [address]      Provider Number: [id number]  Attending Physician Name and Address:  Bartholomew Crews, MD  Relative Name and Phone Number:  [name]; daughter; [phone]    Current Level of Care: Hospital Recommended Level of Care: [place] Prior Approval Number:    Date Approved/Denied:   PASRR Number: [id number]  Discharge Plan: SNF    Current Diagnoses: Patient Active Problem List   Diagnosis Date Noted  . Acute on chronic intracranial subdural hematoma ([name]) 10/30/2017  . CVA (cerebral vascular accident) ([place]) 11/13/2017  . Acute on chronic renal failure ([place])   . Uncontrolled type 2 diabetes mellitus with hyperglycemia, without long-term current use of insulin ([place]) 06/27/2016  . Acute blood loss anemia   . Leg DVT (deep venous thromboembolism), acute, bilateral ([place]) 06/24/2016  . Acute renal failure superimposed on stage 3 chronic kidney disease ([place]) 06/24/2016  . Staphylococcus aureus bacteremia with sepsis ([place])   . Pacemaker infection ([place])   . Enterococcal bacteremia   . Late onset Alzheimer's disease without behavioral disturbance   . Pressure injury of skin 06/18/2016  . Cough 06/17/2016  . Leukocytosis 06/17/2016  . Pacemaker 06/17/2016  . 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 History of infection due to ESBL Escherichia coli   . Chronic kidney disease (CKD), stage III (moderate) ([place]) 04/20/2014  . Prosthetic joint infection ([place]) 04/20/2014  . Septic joint of right knee  joint ([place]) 04/19/2014  . Pyelonephritis 03/11/2014  . Sepsis ([place]) 01/04/2014  . Diabetes mellitus without complication ([place])   . Diabetes mellitus ([place]) 12/21/2013  . Acute UTI 12/20/2013  . History of MI (myocardial infarction) 12/20/2013  . UTI (lower urinary tract infection) 12/20/2013  . Elevated brain natriuretic peptide (BNP) level 12/20/2013  . Hyperglycemia 12/20/2013  . Acute kidney injury ([place]) 12/20/2013  . Decubitus ulcer 12/20/2013  . History of DVT (deep vein thrombosis) 12/20/2013  . Hyperlipidemia   . Hypertension   . Anxiety   . Essential hypertension   . Weakness generalized     Orientation RESPIRATION BLADDER Height & Weight     Self  Normal Continent Weight: 177 lb (80.3 kg) Height:  6' (182.9 cm)  BEHAVIORAL SYMPTOMS/MOOD NEUROLOGICAL BOWEL NUTRITION STATUS      Continent Diet(see discharge summary)  AMBULATORY STATUS COMMUNICATION OF NEEDS Skin   Extensive Assist Verbally Normal                       Personal Care Assistance Level of Assistance  Bathing, Feeding, Dressing Bathing Assistance: Maximum assistance Feeding assistance: Limited assistance Dressing Assistance: Maximum assistance     Functional Limitations Info  Sight, Hearing, Speech Sight Info: Adequate Hearing Info: Adequate Speech Info: Impaired(slurred)    SPECIAL CARE FACTORS FREQUENCY  PT (By licensed PT), OT (By licensed OT)     PT Frequency: 5x week OT Frequency: 5x week            Contractures Contractures Info: Not present    Additional Factors Info  Code Status, Allergies Code Status Info:  Full Code  Allergies Info: No Known Allergies           Current Medications (10/31/2017):  This is the current hospital active medication list Current Facility-Administered Medications  Medication Dose Route Frequency Provider Last Rate Last Dose  . aspirin EC tablet 325 mg  325 mg Oral Daily Rosalin Hawking, MD       Or  . aspirin suppository 300 mg  300 mg Rectal  Daily Rosalin Hawking, MD   300 mg at 10/31/17 0848  . atorvastatin (LIPITOR) tablet 40 mg  40 mg Oral q1800 Rosalin Hawking, MD      . chlorhexidine (PERIDEX) 0.12 % solution 15 mL  15 mL Mouth Rinse BID Bartholomew Crews, MD   15 mL at 10/31/17 0849  . heparin injection 5,000 Units  5,000 Units Subcutaneous Q8H Rosalin Hawking, MD   5,000 Units at 10/31/17 6015  . insulin aspart (novoLOG) injection 0-15 Units  0-15 Units Subcutaneous Q4H [name], MD   2 Units at 10/31/17 [phone]  . MEDLINE mouth rinse  15 mL Mouth Rinse q12n4p Bartholomew Crews, MD   15 mL at 10/30/17 1733  . senna-docusate (Senokot-S) tablet 1 tablet  1 tablet Oral QHS PRN [name], MD         Discharge Medications: Please see discharge summary for a list of discharge medications.  Relevant Imaging Results:  Relevant Lab Results:   Additional Information [id number] [place], [place]

## 2017-10-31 NOTE — Consult Note (Addendum)
Modified Barium Swallow Progress Note  Patient Details  Name: Randy [name] MRN: [id number] Date of Birth: Jan 05, 1925  Today's Date: 10/31/2017  Modified Barium Swallow completed.  Full report located under Chart Review in the Imaging Section.  Brief recommendations include the following:  Clinical Impression Pt presents with a moderate-severe oropharyngeal dysphagia characterized by oral holding, lingual pumping/discoordinated swallow without initiation of swallow pharyngeally with tsp amounts of nectar/honey and/or puree via spoon with max verbal/tactile cues provided; pt with decreased level of alertness/inability to follow simple commands and with eventual oral suctioning required to remove various boluses from oral cavity d/t moderate-severe risk of aspiration and inability to expel independently; recommend NPO status with repeat MBS and/or non-oral feeding initiated d/t pt's risk of aspiration and inadequate nutrition/hydration d/t severe dysphagia/decreased LOA     Swallow Evaluation Recommendations       SLP Diet Recommendations: NPO;Other (Comment)(PO trials/repeat objective assessment)       Medication Administration: Via alternative means               Oral Care Recommendations: Oral care QID        Elvina Sidle, M.S., CCC-SLP 10/31/2017,2:10 PM

## 2017-10-31 NOTE — Progress Notes (Signed)
Pt has rhonchi type breath sounds in bilateral lobes, notified rounding team, physician to assess. Randy [name]

## 2017-10-31 NOTE — Progress Notes (Addendum)
Subjective:  Randy [name] is a 84 y.o. with PMH of CKD, HTN, HLD, prior MI s/p stent, prior CVA, A.fib s/p Pacemaker admit for L MCA CVA on hospital day 2  Randy [name] was seen at bedside this morning. His dysarthria remains improved, but still difficult to understand. Remains AAOx2 to name and location. Able to follow some direction for exam. Continues to have gurgling sounds as reported by the night team. This improved with some suction, but remain present. Patient able to demostrate adequate cough while I was in the room. He denies any choking sensation. No there concerns this AM.  Objective:  Vital signs in last 24 hours: Vitals:   10/30/17 2352 10/31/17 0340 10/31/17 0752 10/31/17 0850  BP: (!) 160/60 (!) 146/55 (!) 130/55   Pulse: 62 67 63   Resp: 18 18 20  (!) 24  Temp: (!) 97.5 F (36.4 C) (!) 97.5 F (36.4 C) 98.2 F (36.8 C)   TempSrc: Oral Oral Oral   SpO2: 96% 94% 92%   Weight:      Height:       Physical Exam  Constitutional: He is well-developed, well-nourished, and in no distress. No distress.  HENT:  Head: Normocephalic and atraumatic.  Mouth/Throat: Oropharynx is clear and moist. No oropharyngeal exudate.  Eyes: Pupils are equal, round, and reactive to light. No scleral icterus.  Right side hemianopsia  Neck: Normal range of motion. Neck supple.  Cardiovascular: Normal rate, regular rhythm, normal heart sounds and intact distal pulses.  Pulmonary/Chest: Effort normal. No respiratory distress.  Rhonchi vs transmitted gurgling from upper airway, able to cough appropriately while I was in the room  Abdominal: Soft. Bowel sounds are normal.  Musculoskeletal: He exhibits no edema or deformity.  Neurological: He is alert.  Right side facial droop. Right sided upper and lower extremity strength 1/5. Left sided upper extremity strength 4/5. Left sided lower extremity strength 2/5. Left foot dorsi flexed  Skin: Skin is warm and dry. He is not diaphoretic.    Surgical scar on knee   Assessment/Plan:  Principal Problem:   CVA (cerebral vascular accident) ([place]) Active Problems:   Hypertension   History of MI (myocardial infarction)   Diabetes mellitus ([place])   Chronic kidney disease (CKD), stage III (moderate) ([name])   Late onset Alzheimer's disease without behavioral disturbance   Acute on chronic intracranial subdural hematoma ([place])  84 year old male with PMH of HTN, HLD, CKD, CVA, prior MI status post stents, A. fib status post pacemaker admitted for left MCA CVA.  His neurological deficits are improving somewhat this admission. Continued right-sided weakness.  Able to speak and follow directions. Continued dysarthria. Unclear of his baseline but appears to be improving.  Neuro on board will follow recommendations.  Volume Overload Patient noticed to have gurgling breath sounds and some edema of his thighs overnight. Maintenance fluids were discontinued. Continues to respirate without difficulty with good saturations. On Lasix 40mg  daily at home. Exam complicated by transmitted upper airway sounds, will obtain CXR for further evaluation. - Hold fluids - Lasix 40mg  IV - CXR  CVA 2/2 left MCA occlusion  Echo show no PFO. Unclear if pacemaker is MRI compatible.  Holding till able to be clarified with his cardiologist.  Cardiologist's office is not open during weekend. - Spoke with on-call cardiologist. States he will call back after speaking with patient's primary cardiologist (Dr.Ray Marya [name]) - Neuro checks - Appreciate Neuro and Neuro surg recs - C/w Atorvastatin 80 mg PO daily -  C/w hold home Warfarin per Neurosurg - Telemetry, Continue tele 1 more day given abnormal breathing pattern. (D/C tele tomorrow)  DVT prophx: Coumadin but letting INR drop. SCDs Diet: NPO til speech and swallow Bowel: Senokot Code: Full  Dispo: Anticipated discharge in approximately 2 day(s).   Randy Seat, MD 10/31/2017, 11:07 AM Pager:  [phone]

## 2017-10-31 NOTE — Significant Event (Addendum)
Rapid Response Event Note  Overview: Time Called: 2130 Arrival Time: 2132 Event Type: Respiratory, Neurologic  Initial Focused Assessment: Called by RN about patient increased RR that started around 9 pm. Per RN, he went in to assess the patient 2100, noticed increased RR and increased WOB, along with L pupil being fixed/dilated. RN called IMTS MD on call as well and I received a call a 2130 about the patient's condition.   Upon arrival, patient's RR was in the 40s, shallow and rapid, almost appeared like "neuro breathing", mild use of accessory muscles, not in full distress but moderately labored. Coarse rhonchi thorough out/diminished in the bases, air movement present bilaterally (not great but present). Patient is and has been been aspirating, is a high aspiration risk, cannot cough up his secretions, very weak cough/gag. Neuro: lethargic, able to say "hospital" x1, withdrew from noxious stimuli, speech very slurred, did not follow commands for me, right sided hemiplegia, + 1 edema generalized, cool to touch, + 1 pulses. VS - HR and SBP/MAP stable, + PPM, sats were 91-92 on 2L [place], RR in the upper 30s to mid 40s at times.  Currently patient is not protecting his airway, he cannot cough up his secretions, and is not alert enough to wear BIPAP. High aspiration risk also limits the use of BIPAP.  Situation is tenuous, patient is in mild distress, given his grave condition and age, I am worried that he might not tolerate breathing like til the morning hours, and will fatigue.  Interventions: -- STAT ABG - 7.47/28.2/49.1/20.3 and O2 Sat of 87 - oxygen increased to VM at 6L then increased over time to 10L to keep sats above 92%. -- STAT CT -  Evolving LEFT insula and basal ganglia nonhemorrhagic infarct (MCA territory), small suspected RIGHT parietal infarct, stable small mixed density subdural collections. -- STAT CXR - Shallow inspiration with atelectasis in the lung bases. -- Lasix 20mg  IV x 1 (  UOP was 225cc prior to Lasix) -- Level of Care: upgrade SDU monitoring   Plan of Care: - IMTS did communicate with family, IMTS Attending MD is also calling the family with an update on patient's condition. - I have coordinated an ICU bed and with RT, should the decision be made that the patient needs to be intubated.  - High Risk for Intubation - IMTS MD will update RR RN as the plan evolves.  - Repeat ABG - improvement of PaO2 - 64.2 - PCCM was consulted, patient placed on HHFNC, patient was more awake, follow commands. - Leave patient on 3W as SDU on HHFNC, should patient decompensate, IMTS will reach out to PCCM. - I called for an updated at 0430, per RN, patient was tolerating HHFNC well and RR was in the mid to upper 20s now.   Event Summary:  Name of Physician Notified: IMTS MDs at 2140, 2155, 2245  Event End Time: 2345, [place], [place]

## 2017-10-31 NOTE — Clinical Social Work Note (Signed)
Clinical Social Work Assessment  Patient Details  Name: Randy [name] MRN: [id number] Date of Birth: [birth year]  Date of referral:  10/31/17               Reason for consult:  Facility Placement                Permission sought to share information with:  Family Supports Permission granted to share information::     Name::     Freight forwarder::  [facility] in [place]  Relationship::  son  Contact Information:     Housing/Transportation Living arrangements for the past 2 months:  Single Family Home Source of Information:  Patient Patient Interpreter Needed:  None Criminal Activity/Legal Involvement Pertinent to Current Situation/Hospitalization:  No - Comment as needed Significant Relationships:  Adult Children Lives with:  Adult Children Do you feel safe going back to the place where you live?  No Need for family participation in patient care:  No (Coment)  Care giving concerns:  Pt is only alert to self. CSW spoke with pt's son via telephone.    Social Worker assessment / plan:  CSW spoke with pt's son via telephone. Pt's son aware pt will need SNF at d/c. Pt's son states pt lives in [place] with his daughter who is disabled because they both have the same caregiver to come in the home. Pt's son lives in [place] and would like for pt to go to [facility] in [place] for SNF--pt has been there in the past. CSW will reach out to facility and send referral.  Employment status:  Retired Insurance underwriter information:  Medicare PT Recommendations:  [place] / Referral to community resources:  [place]  Patient/Family's Response to care: Pt's son verbalized understanding of CSW role and expressed appreciation for support. Pt's son denies any concern regarding pt care at this time.    Patient/Family's Understanding of and Emotional Response to Diagnosis, Current Treatment, and Prognosis:  Pt's son understanding and realistic  regarding pt's physical limitations. Pt's son understands the need for pt to go to SNF at d/c. Pt's son denies any concern regarding pt's treatment plan at this time. CSW will continue to provide support and facilitate d/c needs.   Emotional Assessment Appearance:  Appears stated age Attitude/Demeanor/Rapport:  Unable to Assess Affect (typically observed):  Unable to Assess Orientation:  Oriented to Self Alcohol / Substance use:  Not Applicable Psych involvement (Current and /or in the community):  No (Comment)  Discharge Needs  Concerns to be addressed:  Basic Needs, Care Coordination Readmission within the last 30 days:  No Current discharge risk:  Dependent with Mobility Barriers to Discharge:  Continued Medical Work up   [employer], LCSW 10/31/2017, 4:19 PM

## 2017-10-31 NOTE — Progress Notes (Signed)
Rounding team with pt this am to assess abnormal respiratory rhythm..the patient still continue to sound gurly and wet. RR at 24 at this time. MD paged.  Ave Filter, RN

## 2017-10-31 NOTE — Progress Notes (Signed)
I spoke to patient's son over the phone and updated him on the patient's status and workup thus far. He understands that the patients inability to swallow is concerning, but remains hopeful as his father has recovered from previous strokes requiring artificial nutrition. He would like to wait and monitor for further improvement.   He is also open to speaking with palliative care. He states he is coming back in to town tomorrow and should be able to come by in the next couple of days as he coordinates with his work. He can be reached by phone in the mean time using the number listed in patient demographics.  - Will place palliative care consult  Pearson Grippe, DO IM PGY-2 Pager: [phone]

## 2017-10-31 NOTE — Progress Notes (Signed)
Pt NPO, no IVF since midnight last night. Notified rounding team.  Team will re-evaluate need for fluids tomorrow AM. Arlis Porta

## 2017-10-31 NOTE — Progress Notes (Addendum)
Paged to come see patient about change in mental status, increased respirations to 35 and drop on O2 saturation from 95 to 85%. Nurse also concerned about left pupil dilated and non-reactive. Nurse placed patient on 2L [place] with sats increasing to 93%.  Randy [name] was last oriented around 7pm. When we saw him he would only respond to questions with the word "yes." Not orientedx3. Tachypnic. Diffuse course breath sounds. Grimacing to pain. Discussed with RR as well. He has been unable to have MRI as it has been unknown if patient has compatible pacemaker.  Looking back in chart it appears his left pupil is chronically dilated and non-reactive. Etiology unclear.   ABG: pH 7.47, pCO2 28, PO2 49 HCO3 21  Plan:  CT of the head pending Obtain chest xray Will call and discuss with family  Increase [place] to 6L   Addendum: Spoke with patient's son and discussed patient's condition and possible desaturation. His son states he would want his father to be intubated in the event it became necessary.

## 2017-11-01 DIAGNOSIS — R1312 Dysphagia, oropharyngeal phase: Secondary | ICD-10-CM

## 2017-11-01 DIAGNOSIS — I2510 Atherosclerotic heart disease of native coronary artery without angina pectoris: Secondary | ICD-10-CM

## 2017-11-01 DIAGNOSIS — E873 Alkalosis: Secondary | ICD-10-CM

## 2017-11-01 DIAGNOSIS — Z95810 Presence of automatic (implantable) cardiac defibrillator: Secondary | ICD-10-CM

## 2017-11-01 DIAGNOSIS — D72829 Elevated white blood cell count, unspecified: Secondary | ICD-10-CM

## 2017-11-01 DIAGNOSIS — R011 Cardiac murmur, unspecified: Secondary | ICD-10-CM

## 2017-11-01 DIAGNOSIS — L89159 Pressure ulcer of sacral region, unspecified stage: Secondary | ICD-10-CM

## 2017-11-01 DIAGNOSIS — G8194 Hemiplegia, unspecified affecting left nondominant side: Secondary | ICD-10-CM

## 2017-11-01 DIAGNOSIS — R0602 Shortness of breath: Secondary | ICD-10-CM

## 2017-11-01 DIAGNOSIS — Z79899 Other long term (current) drug therapy: Secondary | ICD-10-CM

## 2017-11-01 DIAGNOSIS — I63412 Cerebral infarction due to embolism of left middle cerebral artery: Principal | ICD-10-CM

## 2017-11-01 DIAGNOSIS — Z955 Presence of coronary angioplasty implant and graft: Secondary | ICD-10-CM

## 2017-11-01 DIAGNOSIS — N179 Acute kidney failure, unspecified: Secondary | ICD-10-CM

## 2017-11-01 LAB — GLUCOSE, CAPILLARY
Glucose-Capillary: 169 mg/dL — ABNORMAL HIGH (ref 70–99)
Glucose-Capillary: 173 mg/dL — ABNORMAL HIGH (ref 70–99)
Glucose-Capillary: 200 mg/dL — ABNORMAL HIGH (ref 70–99)
Glucose-Capillary: 209 mg/dL — ABNORMAL HIGH (ref 70–99)
Glucose-Capillary: 166 mg/dL — ABNORMAL HIGH (ref 70–99)
Glucose-Capillary: 164 mg/dL — ABNORMAL HIGH (ref 70–99)
Glucose-Capillary: 184 mg/dL — ABNORMAL HIGH (ref 70–99)

## 2017-11-01 LAB — CBC
HCT: 45.1 % (ref 39.0–52.0)
HEMOGLOBIN: 14 g/dL (ref 13.0–17.0)
MCH: 28.3 pg (ref 26.0–34.0)
MCHC: 31 g/dL (ref 30.0–36.0)
MCV: 91.1 fL (ref 78.0–100.0)
Platelets: 340 10*3/uL (ref 150–400)
RBC: 4.95 MIL/uL (ref 4.22–5.81)
RDW: 15.3 % (ref 11.5–15.5)
WBC: 19.9 10*3/uL — ABNORMAL HIGH (ref 4.0–10.5)

## 2017-11-01 LAB — BASIC METABOLIC PANEL
Anion gap: 17 — ABNORMAL HIGH (ref 5–15)
BUN: 28 mg/dL — ABNORMAL HIGH (ref 8–23)
CO2: 18 mmol/L — AB (ref 22–32)
Calcium: 9.1 mg/dL (ref 8.9–10.3)
Chloride: 108 mmol/L (ref 98–111)
Creatinine, Ser: 2.22 mg/dL — ABNORMAL HIGH (ref 0.61–1.24)
GFR calc Af Amer: 28 mL/min — ABNORMAL LOW (ref >60)
GFR calc non Af Amer: 24 mL/min — ABNORMAL LOW (ref >60)
GLUCOSE: 193 mg/dL — AB (ref 70–99)
Potassium: 4.5 mmol/L (ref 3.5–5.1)
Sodium: 143 mmol/L (ref 135–145)

## 2017-11-01 LAB — BLOOD GAS, ARTERIAL
ACID-BASE DEFICIT: 2 mmol/L (ref 0.0–2.0)
Bicarbonate: 21.4 mmol/L (ref 20.0–28.0)
DRAWN BY: 252031
FIO2: 40
O2 Saturation: 93.6 %
PO2 ART: 64.2 mmHg — AB (ref 83.0–108.0)
Patient temperature: 98.6
pCO2 arterial: 31.2 mmHg — ABNORMAL LOW (ref 32.0–48.0)
pH, Arterial: 7.45 (ref 7.350–7.450)

## 2017-11-01 MED ORDER — GLYCOPYRROLATE 0.2 MG/ML IJ SOLN
0.2000 mg | INTRAMUSCULAR | Status: AC
  Administered 2017-11-01 – 2017-11-02 (×6): 0.2 mg via INTRAVENOUS
  Filled 2017-11-01 (×6): qty 1

## 2017-11-01 MED ORDER — INSULIN DETEMIR 100 UNIT/ML ~~LOC~~ SOLN: 5.0000 [IU] | Freq: Every day | SUBCUTANEOUS | Status: DC

## 2017-11-01 MED ORDER — INSULIN DETEMIR 100 UNIT/ML ~~LOC~~ SOLN
5.0000 [IU] | Freq: Every day | SUBCUTANEOUS | Status: DC
  Administered 2017-11-01 – 2017-11-05 (×5): 5 [IU] via SUBCUTANEOUS
  Filled 2017-11-01 (×7): qty 0.05

## 2017-11-01 MED ORDER — SODIUM CHLORIDE 0.9 % IV SOLN
INTRAVENOUS | Status: DC
  Administered 2017-11-01 – 2017-11-02 (×2): via INTRAVENOUS

## 2017-11-01 NOTE — Progress Notes (Signed)
  Date: 11/01/2017  Patient name: Randy [name]  Medical record number: [id number]  Date of birth: [birth year]   I have seen and evaluated this patient and I have discussed the plan of care with the house staff. Please see their note for complete details. I concur with their findings with the following additions/corrections: Randy [name] was seen this morning with the team on rounds.  His son was at the bedside.  He was admitted for an evolving left insula and basal ganglia nonhemorrhagic infarct.  This corresponds to the MCA territory.  He also has a small suspected right parietal infarct.  He has stable small mixed density subdural collections.  He is not able to get an MRI as his ICD is not compatible.  He had respiratory distress at night developing a respiratory alkalosis which was mild but significant hypoxia.  He was placed on a Venturi mask followed by high flow nasal cannula and his oxygenation improved. PCCM was consulted as the patient was full code including intubation even after discussion with the patient's son.  This morning, Randy [name] is alert and is able to say simple words but his speech is not clear.  He is in sinus rhythm with a systolic murmur.  He has coarse breath sounds bilaterally but good airflow.  His creatinine has trended up to 2.22.  His LDL is 108 and his HDL is 29.  He has a leukocytosis of 20.  He had a modified barium swallow yesterday which showed moderate to severe oropharyngeal dysphasia and a high risk of aspiration.  He was made n.p.o. with plans to repeat the modified barium swallow at a later date.  The patient's son was at bedside and Dr. Truman Hayward updated him on events.  I informed the son that intubation was best with a reversible underlying condition.  We also discussed that artificial feeding is associated with increased UTIs, dehydration, and does not mitigate the risk of aspiration.  His son was not aware of this.  Randy [name] remains a full code at  this time because he responded so well after his last stroke.  Only time is to tell whether he is going to regain any function and we will continue to have ongoing conversations with the patient and his family.  Palliative care has been consulted to assist in these conversations.  Bartholomew Crews, MD 11/01/2017, 2:54 PM

## 2017-11-01 NOTE — Significant Event (Signed)
Rapid Response Event Note  Follow Up:   I went by to see Randy [name] this evening, he was resting comfortably. I did wake him briefly, I asked if he remembered from last night, he nodded and smiled. I asked if had a good day and if he was comfortable, he said his day was "good" and he was okay. RR in the mid-upper teens, he looks comfortable, not using his accessory muscles, does not appear labored. Still on HHFNC at 30L/min at 50%, saturations were 99%.   Call RRT if needed, will follow as needed.

## 2017-11-01 NOTE — Progress Notes (Signed)
Inpatient Diabetes Program Recommendations  AACE/ADA: New Consensus Statement on Inpatient Glycemic Control (2015)  Target Ranges:  Prepandial:   less than 140 mg/dL      Peak postprandial:   less than 180 mg/dL (1-2 hours)      Critically ill patients:  140 - 180 mg/dL   Lab Results  Component Value Date   GLUCAP 209 (H) 11/01/2017   HGBA1C 8.8 (H) 11/01/2017    Review of Glycemic ControlResults for Randy [name], Randy [name] (MRN [id number]) as of 11/01/2017 11:35  Ref. Range 10/31/2017 16:10 10/31/2017 20:15 11/01/2017 02:07 11/01/2017 03:52 11/01/2017 08:01  Glucose-Capillary Latest Ref Range: 70 - 99 mg/dL 178 (H) 148 (H) 173 (H) 200 (H) 209 (H)    Diabetes history: DM2 Outpatient Diabetes medications: Levemir 20 units bid, Novolog 2 units tid with meals Current orders for Inpatient glycemic control:  Novolog moderate q 4 hours Inpatient Diabetes Program Recommendations:   May consider adding Levemir 5 units daily.   THanks,  Adah Perl, RN, BC-ADM Inpatient Diabetes Coordinator Pager [phone] (8a-5p)

## 2017-11-01 NOTE — Progress Notes (Signed)
SLP Cancellation Note  Patient Details Name: Randy [name] MRN: [id number] DOB: 05-28-1924   Cancelled treatment:       Reason Eval/Treat Not Completed: Medical issues which prohibited therapy. Pt currently requiring significant O2 support. Pt not appropriate for po intake given respiratory needs. Will continue to follow and proceed accordingly. RN aware.  Celia B. Quentin Ore [facility], CCC-SLP Speech Language Pathologist [phone]  Shonna Chock 11/01/2017, 9:33 AM

## 2017-11-01 NOTE — Progress Notes (Signed)
Subjective:  Randy [name] is a 84 y.o. with PMH of prior CVAs, CAD, CKA, A.fib w/ pacemaker, HLD, HTN admit for CVA on hospital day 3   Mr.Sartin was examined and evaluated at bedside this AM with his son and daughter present. There was a discussion pertaining to goals of care and they re-iterated for patient to be left at Full Code. Son states he will change his mind if his father's condition deteriorates, but for now explicitly endorse Full Code. He was alert an oriented to name only. Although his dysarthria had improved, he had a very raspy voice and was difficult to understand. He responds "yeah" to all assessment questionnaire but able to follow some commands.  Objective:  Vital signs in last 24 hours: Vitals:   11/01/17 0300 11/01/17 0400 11/01/17 0720 11/01/17 0800  BP:  (!) 124/56  (!) 128/50  Pulse:  78 67 62  Resp:  (!) 27 (!) 24   Temp:  98.2 F (36.8 C)  98.9 F (37.2 C)  TempSrc:  Axillary  Axillary  SpO2: 96% 96% 100% 98%  Weight:      Height:       Physical Exam  Constitutional: No distress.  HENT:  Head: Normocephalic and atraumatic.  Mouth/Throat: Oropharynx is clear and moist. No oropharyngeal exudate.  Eyes: Conjunctivae are normal. No scleral icterus.  Left eye dilated. Unable to assess extraocular movement  Neck: No JVD present.  Cardiovascular: Normal rate, regular rhythm, normal heart sounds and intact distal pulses.  Pulmonary/Chest: Breath sounds normal. No stridor. No respiratory distress. He has no wheezes. He has no rales.  Tachypneic  Abdominal: Soft. Bowel sounds are normal.  Musculoskeletal: He exhibits no edema, tenderness or deformity.  Neurological: He is alert. A cranial nerve deficit is present.  Oriented x 1.Strength: RUE - 0/5, RLE - 0/5, LUE - 3/5, LLE - 1/5. Unable to assess sensation.  Skin: Skin is warm and dry. He is not diaphoretic.   Assessment/Plan:  Principal Problem:   CVA (cerebral vascular accident) ([place]) Active  Problems:   Hypertension   History of MI (myocardial infarction)   Diabetes mellitus ([place])   Chronic kidney disease (CKD), stage III (moderate) ([name])   Late onset Alzheimer's disease without behavioral disturbance   Acute on chronic intracranial subdural hematoma ([name])   Shortness of breath  84 year old male with PMH of HTN, HLD, CKD, CVA, prior MI status post stents, A. fib status post pacemaker admitted for left MCA CVA.  Overnight had an episode of tachypnea and desaturation with ABG showing respiratory alkalosis.  Upon crit was consulted and put the patient on high flow nasal cannula.  This morning appears improved currently saturating on 96%.  Her mentation appears to be stable.  Not back to baseline per son.  Neurological exam appears worse than Saturday.   Desaturation and Tachypnea w/ respiratory alkalosis 2/2 silent aspiration Overnight desat to 85, RR to 35. ABG showed respiratory alkalosis (pH 7.47, pCO2 28.2, pO2 49.1, Bicarb 20.3). Repeat ABG showed improvement (pH 7.45, pCO2 31.2, PO2 64.2 Bicarb 21.4) Currently satting 96 on HFNC. AM lab show new leukocytosis 19.9 <- 10. X-ray show no edema, effusion or consolidations. Last INR 1.9 while holding home Coumadin.  - Keep NPO - Appreciate pulm recs - C/w glycopyrrolate 0.2mg  q4hr per pulm  - HFNC40L to keep O2 sat >92% per pulm t  CVA 2/2leftMCA occlusion Significant right sided deficits mentioned above. Improved since admission but not back to baseline. Neuro &  neuro surg signed off. F/u with stroke clinic in 4 weeks. - F/u palliative consult - Spoke with primary cardiologist. PACEMAKER NOT COMPATIBLE WITH MRI.  -Neuro checks, permissive hypertension - C/w ASA for now. Resume warfarin when patient able to tolerate PO intake per neuro - Speech and swallow recommend NPO after Barium swallow -C/wAtorvastatin 80 mg PO daily - Sacral ulcer wound care per wound care team - Telemetry  AKI on CKD Baseline creatinine 1.6, Was  1.57 on 9/14. Elevated to 2.22 this AM after stopping fluids. No contrast or renal toxic meds yesterday.  - Restart IV fluids NS 125cc/hr - C/w monitor  DVT prophx:Coumadin but letting INR drop. SCDs Diet: NPO Bowel:Senokot Code:Full  Dispo: Anticipated discharge in approximately 2 day(s).   Note started by [facility], MD [name], MD 11/01/2017, 10:35 AM Pager: [phone]

## 2017-11-01 NOTE — Progress Notes (Signed)
Palliative Medicine consult noted. Due to high referral volume, there may be a delay seeing this patient. Please call the Palliative Medicine Team office at [phone] if recommendations are needed in the interim.  Thank you for inviting [place] to see this patient.  Marjie Skiff Alira Fretwell, RN, BSN, [facility] Palliative Medicine Team 11/01/2017 2:01 PM Office [phone]

## 2017-11-01 NOTE — Progress Notes (Signed)
Re-evaluated patient once again. Now on Venturi mask at 10L for almost 2 hours, but remains tachypneic with RR in the 30s. He is somnolent and does not follow commands. He also has a weak cough reflex. Will order another ABG and plan to consult PCCM for intubation if worsening hypoxia.   OF NOTE: Dr. Rebeca Alert spoke to patient's son Ronaldo Crilly [facility]) once again. Per son, patient remains full code and he would like to proceed with intubation if needed.   Welford Roche, MD  Internal Medicine PGY-2  P [phone]

## 2017-11-01 NOTE — Consult Note (Signed)
[place] Nurse wound consult note Patient evaluated in [facility] [phone].  No family present. Reason for Consult: sacral wound Wound type: Patient has a wound on the right and left sacral/buttock areas.  The one on the left has purple discoloration and some small areas of slough and is consistent with a DTI and unstageable injury.  The one on the right buttock could be related to pressure, but the wound seems more consistent with MASD.  I see the patient has a condom cath in place, and does not communicate, so there is great likelihood some of the damage is related to fecal and urinary incontinence contributing to MASD.  Both wounds had a thin hydrocolloid over the wound beds. Pressure Injury POA: Yes Measurement: The left wound measures 9.3 cm x 6 cm and has purple discoloration along the lateral border.  It also has two spots of yellow slough.  The right wound measures 5 cm x 3.5 cm and has no measurable depth.  Both wounds have peeling skin within the wound beds as well as adjacent to them. Dressing procedure/placement/frequency: Thin hydrocolloids (Replicare, [place] 892) were placed to the wound beds after cleansing them.  These were dated.  They can be left in place up to 5 days.  Additional care measures include an air mattress.  The patient tolerated the evaluation very well. Monitor the wound area(s) for worsening of condition such as: Signs/symptoms of infection,  Increase in size,  Development of or worsening of odor, Development of pain, or increased pain at the affected locations.  Notify the medical team if any of these develop.  Thank you for the consult.  Discussed plan of care with the patient and bedside nurse.  [place] nurse will not follow at this time.  Please re-consult the [place] team if needed.  Val Riles, RN, MSN, CWOCN, CNS-BC, pager [phone]

## 2017-11-01 NOTE — Consult Note (Signed)
Randy [name]  WCH:[id number] DOB: [birth year] DOA: 11/15/2017 PCP: System, Pcp Not In    LOS: 3 days   Reason for Consult / Chief Complaint:  Respiratory Distress   Consulting MD and date:  Dr. Isac Sarna 9/16  HPI/Summary of hospital stay:  84 year old male with PMH of CKD, HTN, HLD, prior MI s/p PE, CVA, A.Fib on Coumadin   Presents to ED on 9/13 with AMS, right sided weakness found to have left MCA. Stay complicated by confusion, dysarthria, and dysphagia. On 9/16 patient with progressive hypoxia, tachypnea, and lethargy. PCCM consulted.     Subjective:  Sitting in bed, no distress, oxygenation 96% on heated high flow   Objective   Blood pressure (!) 121/52, pulse 64, temperature 98.9 F (37.2 C), temperature source Axillary, resp. rate (!) 22, height 6' (1.829 m), weight 80.3 kg, SpO2 98 %.    FiO2 (%):  [70 %] 70 %   Intake/Output Summary (Last 24 hours) at 11/01/2017 0220 Last data filed at 10/31/2017 2241 Gross per 24 hour  Intake 0 ml  Output 1625 ml  Net -1625 ml   Filed Weights   11/06/2017 2152  Weight: 80.3 kg    Examination: General: Elderly male, no distress  HENT: dry MM  Lungs: Rhonchi upper airways  Cardiovascular: RRR, no MRG  Abdomen: Soft, non-distended  Extremities: -edema  Neuro: alert, follows commands GU: Condom cath in place   Consults: date of consult/date signed off & final recs:   PCCM 9/16  Procedures: N/A   Significant Diagnostic Tests: CT Head 9/13 > 1. Loss of gray-white differentiation in the anterior left insular cortex may represent early infarct. 2. Advanced atrophy and white matter disease without other acute cortical infarct. 3. Bilateral mixed density extra-axial fluid collections most likely represent chronic subdural hematomas, right greater than left. Acuity is indeterminate. The patient it have prominent extra-axial CSF spaces in the past. A more subtle hyperdense component is now present. 4. Evidence of  chronic maxillary sinus disease without acute Disease. CTA Head 9/13 > 1. Emergent large vessel occlusion involving the left distal M1 segment with poor collaterals. 2. There is minimal reconstitution of posterior inferior left M2 branches. No other significant collateralization is evident. 3. Moderate to high-grade left paraophthalmic artery stenosis. 4. Approximately 60% stenosis of the proximal left internal carotid artery. 5. Moderate medium and distal small vessel disease in the right MCA territory without a significant proximal stenosis or occlusion. 6. Moderate stenosis of distal right A3 branches. 7. Atherosclerotic changes involving the right carotid bifurcation and cavernous right internal carotid artery without significant stenoses. 8. Atherosclerotic changes at the aortic arch without significant stenosis. 9. Interstitial lung changes noted. DG Swallow 9/19 > Pt presents with a moderate-severe oropharyngeal dysphagia characterized by oral holding, lingual pumping/discoordinated swallow without initiation of swallow pharyngeally with tsp amounts of nectar/honey and/or puree via spoon with max verbal/tactile cues provided; pt with decreased level of alertness/inability to follow simple commands and with eventual oral suctioning required to remove various boluses from oral cavity d/t moderate-severe risk of aspiration and inability to expel independently; recommend NPO status with repeat MBS and/or non-oral feeding initiated d/t pt's risk of aspiration and inadequate nutrition/hydration d/t severe dysphagia/decreased LOA. CXR 9/15 > Shallow inspiration with atelectasis in the lung bases  Micro Data: Urine Culture 9/13 > Negative   Antimicrobials:  N/A    Resolved Hospital Problem list    Assessment & Plan:   Acute Hypoxic Respiratory Distress  with increased secreations and severe dysphagia  CXR with Shallow inspiration with atelectasis in the lung bases. Plan  -Maintain  Oxygen Saturation >92 -Start Heated High Flow with 40 L for PEEP of 5 -Patient is protecting airway and is alert/following commands > no need for escalation of care at this time  -Robinul 0.2 mg q4h for 24 hours   Left MCA CVA H/O Dementia  -Reported Poor Baseline Function  -Per Neurology  -Palliative Care Consult Pending   Disposition / Summary of Today's Plan 11/01/17   84 year old male with recent Stroke with progressive hypoxia and severe dysphagia.     DVT prophylaxis: Heparin SQ Diet: NPO Mobility:Bedrest  Code Status: Full Code as below  Family Communication: Spoke to patient son. Extensive conversations regarding goals of care. States that his goal is to keep patient alive by any means necessary as he is currently out of town and would like to be at the bedside before making decisions for change in code status.   Labs   CBC: Recent Labs  Lab 11/05/2017 1145 11/06/2017 1146  WBC 10.0  --   NEUTROABS 5.1  --   HGB 13.9 15.0  HCT 43.9 44.0  MCV 89.6  --   PLT 328  --    Basic Metabolic Panel: Recent Labs  Lab 11/03/2017 1145 10/22/2017 1146 10/30/17 0716  NA 138 139 139  K 4.3 4.4 4.6  CL 104 104 107  CO2 22  --  18*  GLUCOSE 194* 193* 143*  BUN 25* 30* 21  CREATININE 1.67* 1.70* 1.57*  CALCIUM 9.3  --  8.9   GFR: Estimated Creatinine Clearance: 33.6 mL/min (A) (by C-G formula based on SCr of 1.57 mg/dL (H)). Recent Labs  Lab 10/28/2017 1145  WBC 10.0   Liver Function Tests: Recent Labs  Lab 11/01/2017 1145  AST 18  ALT 11  ALKPHOS 70  BILITOT 1.0  PROT 7.1  ALBUMIN 3.6   No results for input(s): LIPASE, AMYLASE in the last 168 hours. No results for input(s): AMMONIA in the last 168 hours. ABG    Component Value Date/Time   PHART 7.450 10/31/2017 2340   PCO2ART 31.2 (L) 10/31/2017 2340   PO2ART 64.2 (L) 10/31/2017 2340   HCO3 21.4 10/31/2017 2340   TCO2 25 10/21/2017 1146   ACIDBASEDEF 2.0 10/31/2017 2340   O2SAT 93.6 10/31/2017 2340      Coagulation Profile: Recent Labs  Lab 11/13/2017 1145 10/30/17 0716 10/31/17 0628  INR 2.04 2.00 1.99   Cardiac Enzymes: No results for input(s): CKTOTAL, CKMB, CKMBINDEX, TROPONINI in the last 168 hours. HbA1C: Hgb A1c MFr Bld  Date/Time Value Ref Range Status  11/13/2017 11:45 AM 8.8 (H) 4.8 - 5.6 % Final    Comment:    (NOTE) Pre diabetes:          5.7%-6.4% Diabetes:              >6.4% Glycemic control for   <7.0% adults with diabetes   06/25/2016 04:35 AM 11.5 (H) 4.8 - 5.6 % Final    Comment:    (NOTE)         Pre-diabetes: 5.7 - 6.4         Diabetes: >6.4         Glycemic control for adults with diabetes: <7.0    CBG: Recent Labs  Lab 10/31/17 0739 10/31/17 1210 10/31/17 1610 10/31/17 2015 11/01/17 0207  GLUCAP 154* 172* 178* 148* 173*     Review  of Systems:    Unable to review as patient is confused   Past medical history  He,  has a past medical history of Anxiety, Arthritis, CKD (chronic kidney disease), Coronary artery disease, Diabetes mellitus without complication ([place]), Dysrhythmia, Hyperlipidemia, Hypertension, MI (myocardial infarction) ([place]) (2014), and Presence of permanent cardiac pacemaker.   Surgical History    Past Surgical History:  Procedure Laterality Date  . CHOLECYSTECTOMY    . CORONARY ANGIOPLASTY     mLAD '13, RCA DES 03/2013  . EXCISIONAL TOTAL KNEE ARTHROPLASTY WITH ANTIBIOTIC SPACERS Right 04/20/2014   Procedure: IRRIGATION AND DEBRIDMENT RIGHT TOTAL KNEE REMOVE  ACL IMPLANTED AND PLACE SPACER;  Surgeon: [name], MD;  Location: [facility];  Service: Orthopedics;  Laterality: Right;  . HEMORROIDECTOMY    . KNEE SURGERY  2006  . PACEMAKER INSERTION    . PICC LINE PLACE PERIPHERAL ([facility])  04/2014   R upper arm   . TOTAL KNEE ARTHROPLASTY WITH REVISION COMPONENTS Right 06/11/2014   Procedure: TOTAL KNEE ARTHROPLASTY WITH REVISION COMPONENTS REMOVE SPACER PLACE TKA;  Surgeon: [name], MD;  Location: [facility];  Service:  Orthopedics;  Laterality: Right;     Social History   Social History   Socioeconomic History  . Marital status: Widowed    Spouse name: Not on file  . Number of children: Not on file  . Years of education: Not on file  . Highest education level: Not on file  Occupational History  . Occupation: Retired  Scientific laboratory technician  . Financial resource strain: Not on file  . Food insecurity:    Worry: Not on file    Inability: Not on file  . Transportation needs:    Medical: Not on file    Non-medical: Not on file  Tobacco Use  . Smoking status: Former Smoker    Last attempt to quit: 06/07/1983    Years since quitting: 34.4  . Smokeless tobacco: Never Used  Substance and Sexual Activity  . Alcohol use: No  . Drug use: No  . Sexual activity: Never  Lifestyle  . Physical activity:    Days per week: Not on file    Minutes per session: Not on file  . Stress: Not on file  Relationships  . Social connections:    Talks on phone: Not on file    Gets together: Not on file    Attends religious service: Not on file    Active member of club or organization: Not on file    Attends meetings of clubs or organizations: Not on file    Relationship status: Not on file  . Intimate partner violence:    Fear of current or ex partner: Not on file    Emotionally abused: Not on file    Physically abused: Not on file    Forced sexual activity: Not on file  Other Topics Concern  . Not on file  Social History Narrative   Has 24/7 caregivers.  ,  reports that he quit smoking about 34 years ago. He has never used smokeless tobacco. He reports that he does not drink alcohol or use drugs.   Family history   His family history includes Heart attack in his father and mother.   Allergies No Known Allergies  Home meds  Prior to Admission medications   Medication Sig Start Date End Date Taking? Authorizing Provider  acetaminophen (TYLENOL) 500 MG tablet Take 1,000 mg by mouth as needed for mild pain.    Yes  [provider]  furosemide (LASIX) 40 MG tablet Take 40 mg by mouth daily. 10/14/17  Yes [provider]  insulin aspart (NOVOLOG FLEXPEN) 100 UNIT/ML FlexPen Inject 2 Units into the skin 2 (two) times daily. Morning and afternoon   Yes [provider]  insulin detemir (LEVEMIR) 100 UNIT/ML injection Inject 0.1 mLs (10 Units total) into the skin 2 (two) times daily. Patient taking differently: Inject 20 Units into the skin 2 (two) times daily.  07/06/16  Yes Isaac Bliss, Rayford Halsted, MD  warfarin (COUMADIN) 6 MG tablet Take 1 tablet (6 mg total) by mouth daily. Patient taking differently: Take 4 mg by mouth daily at 6 PM.  07/06/16 11/12/2017 Yes Isaac Bliss, Rayford Halsted, MD  enoxaparin (LOVENOX) 100 MG/ML injection Inject 0.9 mLs (90 mg total) into the skin daily. Patient not taking: Reported on 10/31/2017 07/06/16   Isaac Bliss, Rayford Halsted, MD  pantoprazole (PROTONIX) 40 MG tablet Take 1 tablet (40 mg total) by mouth 2 (two) times daily before a meal. Patient not taking: Reported on 10/20/2017 07/06/16   Isaac Bliss, Rayford Halsted, MD    Hayden Pedro, AGACNP-BC [facility]  Pgr: [phone]  PCCM Pgr: [phone]

## 2017-11-02 DIAGNOSIS — R330 Drug induced retention of urine: Secondary | ICD-10-CM

## 2017-11-02 DIAGNOSIS — T443X5A Adverse effect of other parasympatholytics [anticholinergics and antimuscarinics] and spasmolytics, initial encounter: Secondary | ICD-10-CM

## 2017-11-02 DIAGNOSIS — Z960 Presence of urogenital implants: Secondary | ICD-10-CM

## 2017-11-02 DIAGNOSIS — Z515 Encounter for palliative care: Secondary | ICD-10-CM

## 2017-11-02 DIAGNOSIS — E875 Hyperkalemia: Secondary | ICD-10-CM

## 2017-11-02 DIAGNOSIS — R1319 Other dysphagia: Secondary | ICD-10-CM

## 2017-11-02 DIAGNOSIS — I639 Cerebral infarction, unspecified: Secondary | ICD-10-CM

## 2017-11-02 DIAGNOSIS — Z7189 Other specified counseling: Secondary | ICD-10-CM

## 2017-11-02 LAB — GLUCOSE, CAPILLARY
Glucose-Capillary: 115 mg/dL — ABNORMAL HIGH (ref 70–99)
Glucose-Capillary: 132 mg/dL — ABNORMAL HIGH (ref 70–99)
Glucose-Capillary: 134 mg/dL — ABNORMAL HIGH (ref 70–99)
Glucose-Capillary: 147 mg/dL — ABNORMAL HIGH (ref 70–99)
Glucose-Capillary: 159 mg/dL — ABNORMAL HIGH (ref 70–99)
Glucose-Capillary: 167 mg/dL — ABNORMAL HIGH (ref 70–99)

## 2017-11-02 LAB — CBC
HCT: 40.1 % (ref 39.0–52.0)
Hemoglobin: 12.7 g/dL — ABNORMAL LOW (ref 13.0–17.0)
MCH: 28.1 pg (ref 26.0–34.0)
MCHC: 31.7 g/dL (ref 30.0–36.0)
MCV: 88.7 fL (ref 78.0–100.0)
Platelets: 382 10*3/uL (ref 150–400)
RBC: 4.52 MIL/uL (ref 4.22–5.81)
RDW: 15.3 % (ref 11.5–15.5)
WBC: 18.7 10*3/uL — ABNORMAL HIGH (ref 4.0–10.5)

## 2017-11-02 LAB — BASIC METABOLIC PANEL
Anion gap: 16 — ABNORMAL HIGH (ref 5–15)
Anion gap: 13 (ref 5–15)
BUN: 41 mg/dL — ABNORMAL HIGH (ref 8–23)
BUN: 39 mg/dL — ABNORMAL HIGH (ref 8–23)
CALCIUM: 9.1 mg/dL (ref 8.9–10.3)
CALCIUM: 8.6 mg/dL — AB (ref 8.9–10.3)
CO2: 17 mmol/L — ABNORMAL LOW (ref 22–32)
CO2: 18 mmol/L — ABNORMAL LOW (ref 22–32)
Chloride: 114 mmol/L — ABNORMAL HIGH (ref 98–111)
Chloride: 115 mmol/L — ABNORMAL HIGH (ref 98–111)
Creatinine, Ser: 2.26 mg/dL — ABNORMAL HIGH (ref 0.61–1.24)
Creatinine, Ser: 2.22 mg/dL — ABNORMAL HIGH (ref 0.61–1.24)
GFR calc non Af Amer: 24 mL/min — ABNORMAL LOW (ref >60)
GFR calc non Af Amer: 24 mL/min — ABNORMAL LOW (ref >60)
GFR, EST AFRICAN AMERICAN: 27 mL/min — AB (ref >60)
GFR, EST AFRICAN AMERICAN: 28 mL/min — AB (ref >60)
GLUCOSE: 153 mg/dL — AB (ref 70–99)
GLUCOSE: 162 mg/dL — AB (ref 70–99)
POTASSIUM: 5.4 mmol/L — AB (ref 3.5–5.1)
POTASSIUM: 3.8 mmol/L (ref 3.5–5.1)
Sodium: 147 mmol/L — ABNORMAL HIGH (ref 135–145)
Sodium: 146 mmol/L — ABNORMAL HIGH (ref 135–145)

## 2017-11-02 MED ORDER — SODIUM CHLORIDE 0.9 % IV SOLN
INTRAVENOUS | Status: DC
  Administered 2017-11-02 – 2017-11-04 (×4): via INTRAVENOUS

## 2017-11-02 NOTE — Care Management Important Message (Signed)
Important Message  Patient Details  Name: Randy [name] MRN: [id number] Date of Birth: [birth year]   Medicare Important Message Given:  Yes    Orbie Pyo 11/02/2017, 2:23 PM

## 2017-11-02 NOTE — Progress Notes (Signed)
Paged by RN and 12:01 AM: No UOP for almost 24 hours and bladder scan shows 682 mL.  I suspect this is urinary retention caused by glycopyrrolate which he received yesterday for oral secretions after being found in respiratory distress high concern for aspiration.  Will I&O cath x1 and repeat bladder scan in 4-6 hours.  If continues to retain urine will place a Foley catheter.

## 2017-11-02 NOTE — Progress Notes (Signed)
Foley inserted per MD order. Pt had a bladder scan volume of 534. Pt's son [name] was notified about the foley insertion.

## 2017-11-02 NOTE — Progress Notes (Addendum)
No charge palliative note:   Left message for patient's son requesting return call to arrange [place] meeting.   Addendum- planned meeting with [name] tomorrow at [facility], AGNP-C Palliative Medicine  Please call Palliative Medicine team phone with any questions [phone]. For individual providers please see AMION.

## 2017-11-02 NOTE — Progress Notes (Addendum)
Initial Nutrition Assessment  DOCUMENTATION CODES:   Not applicable  INTERVENTION:  If pt continues to be NPO for the next 24-48 hours, may need consideration of enteral nutrition (pending goals of care) using Jevity 1.5 formula at 20 ml/hr and increasing by 10 ml every 4 hours to goal rate of 50 ml/hr.  Regimen to provide 1800 kcal, 77 grams of protein, and 912 ml water.   NUTRITION DIAGNOSIS:   Inadequate oral intake related to inability to eat as evidenced by NPO status.  GOAL:   Patient will meet greater than or equal to 90% of their needs  MONITOR:   Labs, Weight trends, I & O's, Skin  REASON FOR ASSESSMENT:   Low Braden    ASSESSMENT:   84 y.o. with PMH of prior CVAs, CAD, CKA, A.fib w/ pacemaker, HLD, HTN admit for L MCA CVA.  Pt has been NPO since admission 9/13. MBS done 9/15 revealed pt with moderate-severe oropharyngeal dysphagia recommends NPO status. Pt is currently on HFNC. No Po trial/evaluation done by SLP today due to high level of oxygen need. Pt was asleep during time of visit and did not awaken to RD assessment. No family at bedside. RD unable to obtain nutrition history. Palliative care has been consulted and plans to meet with pt's son tomorrow to discuss pt's goals of care. If pt continues to be NPO for the next 24-48 hours, may need consideration of enteral nutrition. Tube feeding recommendations have been stated above. RD to continue to monitor.   Labs and medications reviewed. Sodium elevated at 146. Chloride elevated at 115.   NUTRITION - FOCUSED PHYSICAL EXAM:  Depletion likely related to the natural aging process.     Most Recent Value  Orbital Region  Unable to assess  Upper Arm Region  No depletion  Thoracic and Lumbar Region  No depletion  Buccal Region  Unable to assess  Temple Region  Unable to assess  Clavicle Bone Region  Mild depletion  Clavicle and Acromion Bone Region  Mild depletion  Scapular Bone Region  Unable to assess   Dorsal Hand  Unable to assess  Patellar Region  Moderate depletion  Anterior Thigh Region  Moderate depletion  Posterior Calf Region  Unable to assess  Edema (RD Assessment)  Mild  Hair  Reviewed  Eyes  Unable to assess  Mouth  Unable to assess  Skin  Reviewed  Nails  Unable to assess       Diet Order:   Diet Order            Diet NPO time specified  Diet effective now              EDUCATION NEEDS:   Not appropriate for education at this time  Skin:  Skin Assessment: Skin Integrity Issues: Skin Integrity Issues:: Stage II Stage II: buttocks  Last BM:  Unknown  Height:   Ht Readings from Last 1 Encounters:  10/31/2017 6' (1.829 m)    Weight:   Wt Readings from Last 1 Encounters:  11/11/2017 80.3 kg    Ideal Body Weight:  80.9 kg  BMI:  Body mass index is 24.01 kg/m.  Estimated Nutritional Needs:   Kcal:  6644-0347  Protein:  75-85 grams  Fluid:  1.7 - 1.9 L/day    Corrin Parker, MS, RD, LDN Pager # [phone] After hours/ weekend pager # [phone]

## 2017-11-02 NOTE — Progress Notes (Signed)
Physical Therapy Treatment Patient Details Name: Randy [name] MRN: [id number] DOB: Apr 27, 1924 Today's Date: 11/02/2017    History of Present Illness Pt is a 84 y.o. M with significant PMH of CKD, HTN, H LD, prior MI status, a.fib on Warfarin presenting with AMS. Has significant dysarthria. CT shows chronic subdural hematomas and early infarct in anteiror left insular cortex. CTA shows no large vessel occlusion.    PT Comments    Pt tolerated treatment well. Able to follow one step commands with verbal and tactile cueing consistently, EOB mobility performed to increase activity tolerance. Performed postural retraining to improve sitting balance. Pt performed therapeutic exercises; pt still requires 2+ max assist in for mobility. Pt vital signs during therapy; SPO2 lowest recorded value 88% on 35 L HFNC; end of therapy SPO2 91; HR max 102 during activity, returned to baseline 70s.     Follow Up Recommendations  SNF;Supervision/Assistance - 24 hour     Equipment Recommendations       Recommendations for Other Services       Precautions / Restrictions Precautions Precautions: Fall Restrictions Weight Bearing Restrictions: No    Mobility  Bed Mobility Overal bed mobility: Needs Assistance Bed Mobility: Supine to Sit;Sit to Supine     Supine to sit: +2 for physical assistance;Total assist Sit to supine: +2 for physical assistance;+2 for safety/equipment   General bed mobility comments: Total assistance for negotiation of legs to edge of bed and elevating trunk. No trunk activation noted   Transfers                    Ambulation/Gait                 Stairs             Wheelchair Mobility    Modified Rankin (Stroke Patients Only) Modified Rankin (Stroke Patients Only) Pre-Morbid Rankin Score: Severe disability Modified Rankin: Severe disability     Balance Overall balance assessment: Needs assistance Sitting-balance support: Feet  supported;Single extremity supported Sitting balance-Leahy Scale: Poor  Sitting balance - Comments: Max assistance for maintaining upright with no trunk/core activation noted. tactile cueing for hand support. Maintained for 8 minutes Postural control: Posterior lean;Right lateral lean                                  Cognition Arousal/Alertness: Awake/alert Behavior During Therapy: Flat affect Overall Cognitive Status: Impaired/Different from baseline Area of Impairment: Orientation;Attention;Following commands;Awareness                   Current Attention Level: Focused   Following Commands: Follows one step commands consistently       General Comments: Followed one step commands with motor tasks, patient able to verbalize to simple questions quietly       Exercises Other Exercises Other Exercises: EOB AROM on L LE, PROM on R LE, knee flexion/extension, dorsiflexion x5 required verbal and tactile cueing Other Exercises: AROM of shoulder flexion/ADD on L UE, unable to elicit mobility on R UE and R LE  Other Exercises: Cervical extensions perfromed with overpressure x3 for postural retraining      General Comments General comments (skin integrity, edema, etc.): Elevated HOB for airway clearance, spoke with RN excess humidity in HFNC that was irritating the pt. Pt on 35 L O2 lowest value during therapy noted was 88, at end of treatment SPO2 91  Pertinent Vitals/Pain Pain Assessment: No/denies pain    Home Living                      Prior Function            PT Goals (current goals can now be found in the care plan section) Acute Rehab PT Goals Patient Stated Goal: unable to participate in goal setting PT Goal Formulation: Patient unable to participate in goal setting Time For Goal Achievement: 11/13/17 Potential to Achieve Goals: Poor    Frequency    Min 2X/week      PT Plan Current plan remains appropriate     Co-evaluation              AM-PAC PT "6 Clicks" Daily Activity  Outcome Measure  Difficulty turning over in bed (including adjusting bedclothes, sheets and blankets)?: Unable Difficulty moving from lying on back to sitting on the side of the bed? : Unable Difficulty sitting down on and standing up from a chair with arms (e.g., wheelchair, bedside commode, etc,.)?: Unable Help needed moving to and from a bed to chair (including a wheelchair)?: Total Help needed walking in hospital room?: Total Help needed climbing 3-5 steps with a railing? : Total 6 Click Score: 6    End of Session Equipment Utilized During Treatment: Oxygen(35 L HFNC ) Activity Tolerance: Patient limited by fatigue;Patient tolerated treatment well Patient left: in bed;with call bell/phone within reach;with SCD's reapplied Nurse Communication: Mobility status;Other (comment)(Humidity in HFNC irritating the patient) PT Visit Diagnosis: Other symptoms and signs involving the nervous system (R29.898);Hemiplegia and hemiparesis;Other abnormalities of gait and mobility (R26.89);Muscle weakness (generalized) (M62.81) Hemiplegia - Right/Left: Right Hemiplegia - dominant/non-dominant: Non-dominant     Time: 7672-0947 PT Time Calculation (min) (ACUTE ONLY): 26 min  Charges:                       Alanda Slim SPT   Delvin Hedeen 11/02/2017, 10:39 AM

## 2017-11-02 NOTE — Progress Notes (Signed)
Patient with no urine output at about 2350, bladder scan done with result above 650. Doctor on call notified see order.

## 2017-11-02 NOTE — Progress Notes (Addendum)
Repeat bmp results:  BMP Latest Ref Rng & Units 11/02/2017 11/02/2017 11/01/2017  Glucose 70 - 99 mg/dL 162(H) 153(H) 193(H)  BUN 8 - 23 mg/dL 39(H) 41(H) 28(H)  Creatinine 0.61 - 1.24 mg/dL 2.22(H) 2.26(H) 2.22(H)  Sodium 135 - 145 mmol/L 146(H) 147(H) 143  Potassium 3.5 - 5.1 mmol/L 3.8 5.4(H) 4.5  Chloride 98 - 111 mmol/L 115(H) 114(H) 108  CO2 22 - 32 mmol/L 18(L) 17(L) 18(L)  Calcium 8.9 - 10.3 mg/dL 8.6(L) 9.1 9.1   Repeat BMP show Potassium was falsely elevated. Continuing to endorse AKI. Bun/Cr >1.5 Most likely pre-renal due to dehydration. Will resume fluids after checking if urinary retention is resolved by afternoon. If not resolved, will place foley and then start fluids.  Addendum: Bladder scan show 534cc. Start Foley catheter and will resume maintenance fluids

## 2017-11-02 NOTE — Progress Notes (Signed)
  Speech Language Pathology Treatment: Dysphagia  Patient Details Name: Randy [name] MRN: [id number] DOB: Oct 19, 1924 Today's Date: 11/02/2017 Time: 1250-1305 SLP Time Calculation (min) (ACUTE ONLY): 15 min  Assessment / Plan / Recommendation Clinical Impression  Pt seen at bedside. No family present. Pt continues to require HFNC. Pt is alert, nonvocal, not following commands. Oral care was completed with suction. Pt required verbal, tactile and visual cues to participate in oral care. Moisturizer applied to pt's lips after oral care completed. No po trials were given at this time, given high level of O2 requirement. ST will continue to follow for po readiness.    HPI HPI: 84 year old male with P MH of CKD, HTN, HLD, prior MI status post PE M, prior CVA, A. fib on warfarin presenting with AMS.  He was examined and evaluated at bedside in the ED.  He has significant dysarthria and is unable to answer questions.  He is able to understand and able to nod yes or no.  Currently endorsing no acute pain. Per chart review, he began to have dysarthria right-sided weakness and facial droop beginning this morning.  Normally the patient is able to speak without any difficulties.  At baseline unable to walk but able to move both his upper extremities.  Brought in with acute code stroke.      SLP Plan  Continue with current plan of care       Recommendations  Diet recommendations: NPO Medication Administration: Via alternative means                Oral Care Recommendations: Staff/trained caregiver to provide oral care;Oral care QID Follow up Recommendations: Skilled Nursing facility SLP Visit Diagnosis: Dysphagia, oropharyngeal phase (R13.12) Plan: Continue with current plan of care       GO               Alyssamae Klinck B. Quentin Ore [facility], [place] Speech Language Pathologist [phone]  Shonna Chock 11/02/2017, 1:24 PM

## 2017-11-02 NOTE — Consult Note (Signed)
Consultation Note Date: 11/02/2017   Patient Name: Randy [name]  DOB: 05-Jan-1925  MRN: [id number]  Age / Sex: 84 y.o., male  PCP: System, Pcp Not In Referring Physician: Bartholomew Crews, MD  Reason for Consultation: Establishing goals of care  HPI/Patient Profile: 84 y.o. male  with past medical history of CKD, HTN, HLD, previous MI, CVA, A. fib  admitted on 10/26/2017 with altered mental status, R side flaccid, R facial droop not following commands or talking. Initial workup revealed chronic subdural hematomas with CSF collections of mixed density worrisome for some acute hemmorhage, possible early insular infarct. CTA showed no large vessel occlusion, M1 occlusion with poor collaterals significant cerebral artery disease, multiple vessels with moderate stenosis- surgery was consulted with no recommendations for intervention. Repeat CT scan on 9/15 showed evolving Left insula and ganglia nonhemorrhagic infarct, and R parietal infarct, stable mixed density subdural collections. Clinically, patient is not progressing. SLP eval on 9/15 noted suspected silent aspiration, recommended NPO with ice chips only. RR event on 9/15 pm significant for worsening clinical status worrisome for patient's ability to protect his airway. He continues to have copious oral secretions and is unable to take in anything by mouth. Palliative medicine consulted for [place].  Clinical Assessment and Goals of Care: I reviewed patient's chart and evaluated him at bedside. Noted that primary team has had prior [place] discussions with patient's children indicating desire for full scope care and full code status.  He opens his eyes to voice. Unable to verbalize his name. Denies pain. He has visible increased work of breathing and his nurse tells me she is having to continue to increase his oxygen concentration- he is currently on high flow [place].  He is  unable to move his R arm or squeeze with R hand. Can squeeze with his left hand very lightly, blinks his eyes at my request. Cannot move any lower extremities or wiggle toes.  I was able to reach patient's son- [name] by phone. [name] states that original plan and GOC were for patient to return to SNF in [place]. [name] is under the impression that his father is improving. I expressed concern that his father is showing signs of decline and that it is important to discuss [place] and his father's wishes.    Primary Decision Maker NEXT OF KIN- patient's son- [name] and daughter- [name]    SUMMARY OF RECOMMENDATIONS -Continue current care -[place] meeting planned for tomorrow at [facility]:  Full code  Palliative Prophylaxis:   Aspiration, Delirium Protocol and Frequent Pain Assessment  Prognosis:    Unable to determine  Discharge Planning: To Be Determined  Primary Diagnoses: Present on Admission: . CVA (cerebral vascular accident) ([place]) . Hypertension . Chronic kidney disease (CKD), stage III (moderate) ([name]) . Late onset Alzheimer's disease without behavioral disturbance . Acute on chronic intracranial subdural hematoma ([name])   Scheduled Meds: . aspirin EC  325 mg Oral Daily   Or  . aspirin  300 mg Rectal  Daily  . chlorhexidine  15 mL Mouth Rinse BID  . heparin injection (subcutaneous)  5,000 Units Subcutaneous Q8H  . insulin aspart  0-15 Units Subcutaneous Q4H  . insulin detemir  5 Units Subcutaneous QHS  . mouth rinse  15 mL Mouth Rinse q12n4p   Continuous Infusions: . sodium chloride     PRN Meds:. Medications Prior to Admission:  Prior to Admission medications   Medication Sig Start Date End Date Taking? Authorizing Provider  acetaminophen (TYLENOL) 500 MG tablet Take 1,000 mg by mouth as needed for mild pain.   Yes [provider]  furosemide (LASIX) 40 MG tablet Take 40 mg by mouth daily. 10/14/17  Yes [provider]  insulin aspart (NOVOLOG FLEXPEN) 100 UNIT/ML FlexPen Inject 2 Units into the skin 2 (two) times daily. Morning and afternoon   Yes [provider]  insulin detemir (LEVEMIR) 100 UNIT/ML injection Inject 0.1 mLs (10 Units total) into the skin 2 (two) times daily. Patient taking differently: Inject 20 Units into the skin 2 (two) times daily.  07/06/16  Yes Isaac Bliss, Rayford Halsted, MD  warfarin (COUMADIN) 6 MG tablet Take 1 tablet (6 mg total) by mouth daily. Patient taking differently: Take 4 mg by mouth daily at 6 PM.  07/06/16 10/18/2017 Yes Isaac Bliss, Rayford Halsted, MD  enoxaparin (LOVENOX) 100 MG/ML injection Inject 0.9 mLs (90 mg total) into the skin daily. Patient not taking: Reported on 11/11/2017 07/06/16   Isaac Bliss, Rayford Halsted, MD  pantoprazole (PROTONIX) 40 MG tablet Take 1 tablet (40 mg total) by mouth 2 (two) times daily before a meal. Patient not taking: Reported on 10/18/2017 07/06/16   Isaac Bliss, Rayford Halsted, MD   No Known Allergies Review of Systems  Unable to perform ROS   Physical Exam - as noted in patient profile above  Vital Signs: BP (!) 144/68 (BP Location: Left Arm)   Pulse 63   Temp 98.9 F (37.2 C) (Axillary)   Resp (!) 21   Ht 6' (1.829 m)   Wt 80.3 kg   SpO2 97%   BMI 24.01 kg/m  Pain Scale: PAINAD   Pain Score: 0-No pain   SpO2: SpO2: 97 % O2 Device:SpO2: 97 % O2 Flow Rate: .O2 Flow Rate (L/min): 30 L/min  IO: Intake/output summary:   Intake/Output Summary (Last 24 hours) at 11/02/2017 1629 Last data filed at 11/02/2017 1119 Gross per 24 hour  Intake 1691.04 ml  Output 700 ml  Net 991.04 ml    LBM: Last BM Date: (pt unable to answer question ) Baseline Weight: Weight: 80.3 kg Most recent weight: Weight: 80.3 kg     Palliative Assessment/Data: PPS: 10%     Thank you for this consult. Palliative medicine will continue to follow and assist as needed.   Time In: 1330 Time Out: 1440 Time Total: 70 mins  Greater than  50%  of this time was spent counseling and coordinating care related to the above assessment and plan.  Signed by: Mariana Kaufman, AGNP-C Palliative Medicine    Please contact Palliative Medicine Team phone at [phone] for questions and concerns.  For individual provider: See Shea Evans

## 2017-11-02 NOTE — Progress Notes (Signed)
Subjective:  Randy [name] is a 84 y.o. with PMH of prior CVAs, CAD, CKA, A.fib w/ pacemaker, HLD, HTN admit for L MCA CVA on hospital day 4  Randy [name] was examined at bedside and was found sleeping. He awakened with verbal command and hand rub. Had secretions after a cough which was suctioned by the team. He however continued to cough with copious oral secretion. Appeared somnolent and lethargic. On physical exams, he was able to shake hands but with noticeable weakness. Unable to move lower extremities against gravity. He denied any abdominal pain. Condom cath urine bag showed 40cc of yellow urine.  Objective:  Vital signs in last 24 hours: Vitals:   11/02/17 0125 11/02/17 0520 11/02/17 0828 11/02/17 0839  BP: 125/64 (!) 147/50    Pulse: 79  75   Resp: (!) 24     Temp:  97.7 F (36.5 C)    TempSrc:  Axillary    SpO2: 99% 97%  93%  Weight:      Height:       Physical Exam  Constitutional: No distress.  HENT:  Mouth/Throat: Oropharyngeal exudate (Glutteral coughs with inability to clear oral secretions. Suctioning revealed clear productive sputum ) present.  Eyes: Conjunctivae are normal.  Left dilated pupil. Unable to assess extraocular movement.  Cardiovascular: Normal rate, regular rhythm and intact distal pulses.  Murmur (systolic murmur on left sternal border) heard. Palpable pacemaker. Cool extremities  Pulmonary/Chest: He has no wheezes. He has no rales.  Neurological: He is alert. He has normal reflexes.  Alert but worsened dysarthria. Unable to assess orientation. Strength: RUE 0/5, LUE 2/5, RLE 0/5, LLE 1/5. Unable to assess sensation   Skin: He is not diaphoretic.   Assessment/Plan:  Principal Problem:   CVA (cerebral vascular accident) ([place]) Active Problems:   Hypertension   History of MI (myocardial infarction)   Diabetes mellitus ([place])   Chronic kidney disease (CKD), stage III (moderate) ([name])   Late onset Alzheimer's disease without behavioral  disturbance   Acute on chronic intracranial subdural hematoma ([name])   Shortness of breath  84 year old male with PMH of HTN, HLD, CKD, CVA, prior MI status post stents, A. fib status post pacemaker admitted for left MCA CVA.  Yesterday he produced minimal urine bladder scan showed 600 cc.  Night team ordered INO cath and he eliminated about 500 cc.  Patient no longer on glycopyrrolate will monitor urine output throughout the day and consider putting in Foley.  Also had hyperkalemia on BMP this a.m. abnormal values make me suspect hemolysis of the lab blood sample will repeat BMP and work-up as needed if continuing to be abnormal.  Urinary Retention 2/2 glycopyrrolate Minimal urine output yesterday. Bladder scan at 12:AM showed 682 CCs. I&O cath x1 pulled out -700cc. Repeat bladder scan showed 150cc at 6am - Repeat bladder scan at 12:00 - If no further output, will put in foley - Off glycopyrrolate now  Desaturation and Tachypnea w/ respiratory alkalosis 2/2 silent aspiration Satting 93 on 30L this morning. Still having significant oral secretions that he cannot clear - Keep NPO, Frequent suctioning - Appreciate pulm recs - HFNC30L to keep O2 sat >92% per pulm, wean off per respiratory  CVA 2/2leftMCA occlusion Appears to have fluctuating mentation. Alert but minimally responsive with worsened dysarthria compared to yesterday. Able to answer with nodding of his head.e. Neuro & neuro surg signed off. F/u with stroke clinic in 4 weeks. - F/u palliative consult -Neuro checks, permissive hypertension -  C/w ASA, per neurosurg should not restart Coumadin - C/w NPO, frequent suctioning -C/wAtorvastatin 80 mg PO daily - Sacral ulcer wound care per wound care team - Telemetry  AKI on CKD Baseline creatinine 1.6, Was 1.57 on 9/14. Elevated to 2.22 this AM after stopping fluids. Morning BMP show K of 5.4, BUN 41 Creatinine 2.26. Suspect hemolysis of lab sample. - Stat Repeat BMP - C/w  monitor  DVT prophx:SCDs Diet: NPO Bowel:Senokot Code:Full Dispo: Anticipated discharge in approximately 3 day(s).   [name], MD 11/02/2017, 10:11 AM Pager: [phone]

## 2017-11-02 NOTE — Progress Notes (Signed)
  Date: 11/02/2017  Patient name: Randy [name]  Medical record number: [id number]  Date of birth: Apr 26, 1924   I have seen and evaluated this patient and I have discussed the plan of care with the house staff. Please see their note for complete details. I concur with their findings with the following additions/corrections: [name] was seen on team AM rounds. No family at bedside. He had sig secretions and a wet cough during our visit. Overall prognosis poor. I appreciate [facility] Care assistance   Bartholomew Crews, MD 11/02/2017, 1:05 PM

## 2017-11-03 LAB — BASIC METABOLIC PANEL
ANION GAP: 13 (ref 5–15)
BUN: 39 mg/dL — ABNORMAL HIGH (ref 8–23)
CO2: 16 mmol/L — ABNORMAL LOW (ref 22–32)
Calcium: 8.3 mg/dL — ABNORMAL LOW (ref 8.9–10.3)
Chloride: 118 mmol/L — ABNORMAL HIGH (ref 98–111)
Creatinine, Ser: 1.79 mg/dL — ABNORMAL HIGH (ref 0.61–1.24)
GFR calc Af Amer: 36 mL/min — ABNORMAL LOW (ref >60)
GFR, EST NON AFRICAN AMERICAN: 31 mL/min — AB (ref >60)
Glucose, Bld: 162 mg/dL — ABNORMAL HIGH (ref 70–99)
POTASSIUM: 3.5 mmol/L (ref 3.5–5.1)
SODIUM: 147 mmol/L — AB (ref 135–145)

## 2017-11-03 LAB — CBC
HEMATOCRIT: 38.5 % — AB (ref 39.0–52.0)
HEMOGLOBIN: 12.3 g/dL — AB (ref 13.0–17.0)
MCH: 28.2 pg (ref 26.0–34.0)
MCHC: 31.9 g/dL (ref 30.0–36.0)
MCV: 88.3 fL (ref 78.0–100.0)
Platelets: 338 10*3/uL (ref 150–400)
RBC: 4.36 MIL/uL (ref 4.22–5.81)
RDW: 15.4 % (ref 11.5–15.5)
WBC: 12.1 10*3/uL — ABNORMAL HIGH (ref 4.0–10.5)

## 2017-11-03 LAB — GLUCOSE, CAPILLARY
Glucose-Capillary: 127 mg/dL — ABNORMAL HIGH (ref 70–99)
Glucose-Capillary: 129 mg/dL — ABNORMAL HIGH (ref 70–99)
Glucose-Capillary: 120 mg/dL — ABNORMAL HIGH (ref 70–99)
Glucose-Capillary: 113 mg/dL — ABNORMAL HIGH (ref 70–99)
Glucose-Capillary: 133 mg/dL — ABNORMAL HIGH (ref 70–99)
Glucose-Capillary: 129 mg/dL — ABNORMAL HIGH (ref 70–99)

## 2017-11-03 MED ORDER — GLYCOPYRROLATE 0.2 MG/ML IJ SOLN
0.2000 mg | INTRAMUSCULAR | Status: AC
  Administered 2017-11-03 (×3): 0.2 mg via INTRAVENOUS
  Filled 2017-11-03 (×3): qty 1

## 2017-11-03 NOTE — Progress Notes (Signed)
Daily Progress Note   Patient Name: Randy [name]       Date: 11/03/2017 DOB: 08/24/1924  Age: 84 y.o. MRN#: [id number] Attending Physician: Bartholomew Crews, MD Primary Care Physician: System, Pcp Not In Admit Date: 10/24/2017  Reason for Consultation/Follow-up: Establishing goals of care  Subjective: Met with patient's son, Randy [name]. Patient previously worked various jobs in [place]. He enjoyed woodworking. Prior to admission he was bedbound since his MI approx 3 years ago. He was able to feed himself, recognize and interact with family members.   We discussed their current illness and what it means in the larger context of their on-going co-morbidities.  Natural disease trajectory and expectations at EOL were discussed. We discussed patient's very poor prognosis for meaningful functional recovery. We reviewed his fluctuating respiratory status, his inability to take in nutrition or hydration and how this affects his overall recovery.   The difference between aggressive medical intervention and comfort care was reviewed.   Advanced directives, concepts specific to code status, artifical feeding and hydration, and rehospitalization were considered and discussed.  Hospice and Palliative Care services outpatient were explained and offered.  Questions and concerns were addressed.  Hard Choices booklet left for review. The family was encouraged to call with questions or concerns.   Randy [name] states for now he chooses to continue aggressive medical care including artificial feeding and PEG tube placement. He states that if his Dad were in [place] now, in this state, he would be more inclined to transition to a comfort path because he would be able to be at his bedside more during his final days.  Randy [name] is feeling guilt over having to work and not being able to be in [place] with his Dad. We discussed if there was an option of transferring his Dad to a [facility] in [place] for comfort care- would he choose that option- Randy [name] states he is uncertain of that. He reverts back to saying he has seen his Dad "like this before" referring to his Dad's previous stroke and MI. I encouraged Randy [name] to look at the fact that his Dad was in a different state of health prior to that incident.  Randy [name] is able to set care limit with DNR status- but otherwise full scope care including advanced airway protection and intubation for airway support.  We  discussed patient's status is tenuous and it is possible he may not be able to get to a point where he can survive a transfer to a SNF in [place]. Randy [name] is understanding, and open to continued [place] discussions.   Review of Systems  Unable to perform ROS: Acuity of condition    Length of Stay: 5  Current Medications: Scheduled Meds:  . aspirin EC  325 mg Oral Daily   Or  . aspirin  300 mg Rectal Daily  . chlorhexidine  15 mL Mouth Rinse BID  . glycopyrrolate  0.2 mg Intravenous Q4H  . heparin injection (subcutaneous)  5,000 Units Subcutaneous Q8H  . insulin aspart  0-15 Units Subcutaneous Q4H  . insulin detemir  5 Units Subcutaneous QHS  . mouth rinse  15 mL Mouth Rinse q12n4p    Continuous Infusions: . sodium chloride 75 mL/hr at 11/03/17 1430    PRN Meds:   Physical Exam          Vital Signs: BP (!) 117/50   Pulse 84   Temp 98.2 F (36.8 C) (Oral)   Resp (!) 29   Ht 6' (1.829 m)   Wt 80.3 kg   SpO2 92%   BMI 24.01 kg/m  SpO2: SpO2: 92 % O2 Device: O2 Device: Nasal Cannula O2 Flow Rate: O2 Flow Rate (L/min): 5 L/min  Intake/output summary:   Intake/Output Summary (Last 24 hours) at 11/03/2017 1611 Last data filed at 11/03/2017 1287 Gross per 24 hour  Intake 1825.9 ml  Output 1075 ml  Net 750.9 ml   LBM: Last BM Date: (pt  unable to recall) Baseline Weight: Weight: 80.3 kg Most recent weight: Weight: 80.3 kg       Palliative Assessment/Data: PPS: 10%      Patient Active Problem List   Diagnosis Date Noted  . Acute ischemic stroke ([place])   . Other dysphagia   . Advanced care planning/counseling discussion   . Goals of care, counseling/discussion   . Palliative care by specialist   . Shortness of breath   . Acute on chronic intracranial subdural hematoma ([name]) 10/30/2017  . CVA (cerebral vascular accident) ([place]) 11/11/2017  . Acute on chronic renal failure ([place])   . Uncontrolled type 2 diabetes mellitus with hyperglycemia, without long-term current use of insulin ([place]) 06/27/2016  . Acute blood loss anemia   . Leg DVT (deep venous thromboembolism), acute, bilateral ([place]) 06/24/2016  . Acute renal failure superimposed on stage 3 chronic kidney disease ([place]) 06/24/2016  . Staphylococcus aureus bacteremia with sepsis ([place])   . Pacemaker infection ([place])   . Enterococcal bacteremia   . Late onset Alzheimer's disease without behavioral disturbance   . Pressure injury of skin 06/18/2016  . Cough 06/17/2016  . Leukocytosis 06/17/2016  . Pacemaker 06/17/2016  . 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 History of infection due to ESBL Escherichia coli   . Chronic kidney disease (CKD), stage III (moderate) ([place]) 04/20/2014  . Prosthetic joint infection ([place]) 04/20/2014  . Septic joint of right knee joint ([place]) 04/19/2014  . Pyelonephritis 03/11/2014  . Sepsis ([place]) 01/04/2014  . Diabetes mellitus without complication ([place])   . Diabetes mellitus ([place]) 12/21/2013  . Acute UTI 12/20/2013  . History of MI (myocardial infarction) 12/20/2013  . UTI (lower urinary tract infection) 12/20/2013  . Elevated brain natriuretic peptide (BNP) level 12/20/2013  . Hyperglycemia  12/20/2013  . Acute kidney injury ([place]) 12/20/2013  . Decubitus ulcer 12/20/2013  . History of DVT (deep vein thrombosis) 12/20/2013  . Hyperlipidemia   . Hypertension   . Anxiety   . Essential hypertension   . Weakness generalized     Palliative Care Assessment & Plan   Patient Profile: 84 y.o. male  with past medical history of CKD, HTN, HLD, previous MI, CVA, A. fib  admitted on 10/23/2017 with altered mental status, R side flaccid, R facial droop not following commands or talking. Initial workup revealed chronic subdural hematomas with CSF collections of mixed density worrisome for some acute hemmorhage, possible early insular infarct. CTA showed no large vessel occlusion, M1 occlusion with poor collaterals significant cerebral artery disease, multiple vessels with moderate stenosis- surgery was consulted with no recommendations for intervention. Repeat CT scan on 9/15 showed evolving Left insula and ganglia nonhemorrhagic infarct, and R parietal infarct, stable mixed density subdural collections. Clinically, patient is not progressing. SLP eval on 9/15 noted suspected silent aspiration, recommended NPO with ice chips only. RR event on 9/15 pm significant for worsening clinical status worrisome for patient's ability to protect his airway. He continues to have copious oral secretions and is unable to take in anything by mouth. Palliative medicine consulted for [place].  Assessment/Recommendations/Plan   Continue current care with attempt to stabilize for d/c to SNF in [facility]  DNR with full scope care including advanced airway protection if needed if patient's respiratory status declines  Begin artifical feeding (I question if 1. SNF will take patient with [place] and 2. If he would survive PEG tube placement?)  PMT will continue to follow and discuss GOC with [place] does note that he feels he gets conflicting information from staff when he calls regarding his father's progress (he  notes he called and spoke with nursing at night was told that his Dad was improving)- recommend communicate information honestly but with compassion regarding patient's status and prognosis using phrases including "we hope" but "our worry"..etc.  Goals of Care and Additional Recommendations:  Limitations on Scope of Treatment: Full Scope Treatment  Code Status:  DNR  Prognosis:   Unable to determine- if transitioned to comfort likely less than 2 weeks- with full scope care I doubt he would survive more than 1 year  Discharge Planning:  To Be Determined  Care plan was discussed with patient's son- Randy [name]  Thank you for allowing the Palliative Medicine Team to assist in the care of this patient.   Time In: 1500 Time Out: 1640 Total Time 100 mins Prolonged Time Billed Yes      Greater than 50%  of this time was spent counseling and coordinating care related to the above assessment and plan.  Mariana Kaufman, AGNP-C Palliative Medicine   Please contact Palliative Medicine Team phone at [phone] for questions and concerns.

## 2017-11-03 NOTE — Progress Notes (Signed)
RN received in report that pt was now on RA after being on 25L of oxygen over night. RN checked pt and noticed O2 sats were 87% on RA , RR 29; and labored breathing. RN put pt on 2L of O2 and sats came up to 95%. Pt's sats began to drop again to 83% on 2L of O2 and RN had to increase O2 to 5L. RN notified MD and respiratory therapy.

## 2017-11-03 NOTE — Progress Notes (Signed)
Subjective:  GAR GLANCE is a 84 y.o. with PMH of prior CVAs, CAD, CKA, A.fib w/ pacemaker, HLD, HTN admit for L MCA CVA on hospital day 5   [name] was examined and evaluated at bedside this a.m.  He appears to have slightly improved dysarthria and we are able to understand some of his words.  Currently off high flow nasal cannula satting in the high 80s on room air.  He nods no when asked about any significant pain.  He appears to resting comfortably in bed.  Informed the patient that his family will be here this afternoon for family meeting.  Patient nods to show understanding.  Objective:  Vital signs in last 24 hours: Vitals:   11/03/17 1055 11/03/17 1100 11/03/17 1105 11/03/17 1141  BP:    (!) 106/56  Pulse:      Resp:    (!) 22  Temp:    98.2 F (36.8 C)  TempSrc:    Oral  SpO2: 96% 96% 95%   Weight:      Height:       Physical Exam  Constitutional: No distress.  HENT:  Mouth/Throat: Oropharyngeal exudate (Glutteral coughs with inability to clear oral secretions.) present.  Eyes: Conjunctivae are normal.  Left dilated pupil. Unable to assess extraocular movement.  Cardiovascular: Normal rate, regular rhythm and intact distal pulses.  Murmur (systolic murmur on left sternal border) heard. Palpable pacemaker. Cool extremities  Pulmonary/Chest: He has no wheezes. He has no rales.  Abdominal: Soft. Bowel sounds are normal.  Neurological: He is alert. He has normal reflexes. A cranial nerve deficit is present.  Dysarthria somewhat improved compared to yesterday. AAO to name and location (hospital but not which hospital) but not date (thought 5). Strength: RUE 0/5, LUE 3/5, RLE 0/5, LLE 1/5. Unable to assess sensation.  Skin: Skin is warm and dry. He is not diaphoretic.   Assessment/Plan:  Principal Problem:   CVA (cerebral vascular accident) ([place]) Active Problems:   Hypertension   History of MI (myocardial infarction)   Diabetes mellitus ([place])   Chronic  kidney disease (CKD), stage III (moderate) ([name])   Late onset Alzheimer's disease without behavioral disturbance   Acute on chronic intracranial subdural hematoma ([name])   Shortness of breath   Acute ischemic stroke [facility])   Other dysphagia   Advanced care planning/counseling discussion   Goals of care, counseling/discussion   Palliative care by specialist  84 year old male with PMH of HTN, HLD, CKD, CVA, prior MI status post stents, A. fib status post pacemaker admitted for left MCA CVA. He appears to have fluctuating mentation but continuing to endorse significant residual deficits from his CVA which have not improved significantly since admission. Palliative care is on board and planning on family meeting today. Will f/u with their recommendations  Urinary Retention 2/2 glycopyrrolate Foley placed yesterday. 600 cc output since foley placement - C/w monitor Output  Desaturation and tachypnea 2/2 silent aspirations Was placed on HFNC yesterday. Was satting 40 on RA this AM but have been requiring [place] support per RN - Keep NPO, Frequent suctioning - Appreciate pulm recs - Currently on O2 [place] 5L, keep sat above 88  CVA 2/2leftMCA occlusion Appears to have fluctuating mentation. Alert but minimally responsive. Able to answer with nodding of his head.e. Neuro & neuro surg signed off. F/u with stroke clinic in 4 weeks. Family meeting scheduled for this afternoon. - Appreciate palliative recs -Neuro checks, permissive hypertension - C/w ASA, per neurosurg should  not restart Coumadin - C/w NPO, frequent suctioning -C/wAtorvastatin 80 mg PO daily - Sacral ulcer wound care per wound care team  AKI on CKD 2/2 dehydration Baseline creatinine 1.6. Creatine this AM 1.79 after starting maintenance fluids yesterday - Resolved  DVT prophx:SCDs Diet: NPO Bowel:Senokot Code:Full Dispo: Anticipated discharge in approximately 2 day(s).   [name], MD 11/03/2017, 11:43 AM Pager:  [phone]

## 2017-11-03 NOTE — Progress Notes (Signed)
RT entered room and found patient with HFNC off and patient spo2 94% on Room Air.  Patient's vitals are stable, with RR 20s. Patient tolerated NTS well and is resting comfortably on room air at this time. HFNC is at bedside if needed. RN is aware.  RT will monitor as needed.

## 2017-11-03 NOTE — Progress Notes (Signed)
  Date: 11/03/2017  Patient name: Randy [name]  Medical record number: [id number]  Date of birth: [birth year]   I have seen and evaluated this patient and I have discussed the plan of care with the house staff. Please see their note for complete details. I concur with their findings with the following additions/corrections: Randy [name] was seen on team morning rounds.  He is alert but appears fatigued and lethargic.  He has fewer wet cough today but that can be worrisome as he may not be clearing secretions/aspirations.  He is back on the glycopyrrolate but has a Foley catheter now.  His sodium has increased to 147, bicarb decreased to 16, and creatinine has a slight trend down.  Overall prognosis is poor and palliative care has a goals of care conversation planned today with the family.  Bartholomew Crews, MD 11/03/2017, 1:14 PM

## 2017-11-04 ENCOUNTER — Inpatient Hospital Stay (HOSPITAL_COMMUNITY): Payer: Medicare Other

## 2017-11-04 DIAGNOSIS — J690 Pneumonitis due to inhalation of food and vomit: Secondary | ICD-10-CM

## 2017-11-04 DIAGNOSIS — R339 Retention of urine, unspecified: Secondary | ICD-10-CM

## 2017-11-04 DIAGNOSIS — R0603 Acute respiratory distress: Secondary | ICD-10-CM

## 2017-11-04 DIAGNOSIS — Z66 Do not resuscitate: Secondary | ICD-10-CM

## 2017-11-04 LAB — BASIC METABOLIC PANEL
Anion gap: 10 (ref 5–15)
BUN: 36 mg/dL — ABNORMAL HIGH (ref 8–23)
CO2: 17 mmol/L — ABNORMAL LOW (ref 22–32)
CREATININE: 1.68 mg/dL — AB (ref 0.61–1.24)
Calcium: 8.3 mg/dL — ABNORMAL LOW (ref 8.9–10.3)
Chloride: 124 mmol/L — ABNORMAL HIGH (ref 98–111)
GFR calc Af Amer: 39 mL/min — ABNORMAL LOW (ref >60)
GFR, EST NON AFRICAN AMERICAN: 34 mL/min — AB (ref >60)
Glucose, Bld: 131 mg/dL — ABNORMAL HIGH (ref 70–99)
Potassium: 3.4 mmol/L — ABNORMAL LOW (ref 3.5–5.1)
SODIUM: 151 mmol/L — AB (ref 135–145)

## 2017-11-04 LAB — GLUCOSE, CAPILLARY
Glucose-Capillary: 113 mg/dL — ABNORMAL HIGH (ref 70–99)
Glucose-Capillary: 118 mg/dL — ABNORMAL HIGH (ref 70–99)
Glucose-Capillary: 119 mg/dL — ABNORMAL HIGH (ref 70–99)
Glucose-Capillary: 120 mg/dL — ABNORMAL HIGH (ref 70–99)
Glucose-Capillary: 124 mg/dL — ABNORMAL HIGH (ref 70–99)
Glucose-Capillary: 145 mg/dL — ABNORMAL HIGH (ref 70–99)
Glucose-Capillary: 156 mg/dL — ABNORMAL HIGH (ref 70–99)

## 2017-11-04 LAB — CBC
HCT: 42.6 % (ref 39.0–52.0)
Hemoglobin: 13.1 g/dL (ref 13.0–17.0)
MCH: 28.3 pg (ref 26.0–34.0)
MCHC: 30.8 g/dL (ref 30.0–36.0)
MCV: 92 fL (ref 78.0–100.0)
Platelets: 368 10*3/uL (ref 150–400)
RBC: 4.63 MIL/uL (ref 4.22–5.81)
RDW: 15.9 % — AB (ref 11.5–15.5)
WBC: 12.8 10*3/uL — AB (ref 4.0–10.5)

## 2017-11-04 LAB — PHOSPHORUS: PHOSPHORUS: 2.7 mg/dL (ref 2.5–4.6)

## 2017-11-04 LAB — MRSA PCR SCREENING: MRSA by PCR: NEGATIVE

## 2017-11-04 MED ORDER — DEXTROSE-NACL 5-0.45 % IV SOLN
INTRAVENOUS | Status: AC
  Administered 2017-11-04: 19:00:00 via INTRAVENOUS

## 2017-11-04 MED ORDER — SODIUM CHLORIDE 0.9 % IV SOLN
3.0000 g | Freq: Three times a day (TID) | INTRAVENOUS | Status: DC
  Administered 2017-11-04 – 2017-11-06 (×7): 3 g via INTRAVENOUS
  Filled 2017-11-04 (×8): qty 3

## 2017-11-04 NOTE — Progress Notes (Signed)
Paged for patient having more purulent secretions and gurgling per nursing. On going to see patient he does have increased secretions which are opaque in nature. He responds to commands to cough. Cough is improved from previous but does not completely clear airway. He appears comfortable and is awake. He is not tachycardic, afebrile. We will order chest xr and have RT come see him. He was previously on glycopyrrolate, which was helping his secretions, but ended up requiring a foley so we will hold off on reordering glycopyrrolate at this time.   Plan:  - chest xr ordered  - RT   Seawell, Jaimie A, DO 11/04/2017, 5:34 AM Pager: [phone]

## 2017-11-04 NOTE — Progress Notes (Signed)
Pharmacy Antibiotic Note  Randy [name] is a 84 y.o. male to begin Unasyn dosing for aspiration pneumonia.   Scr was up but now back to baseline.  Borderline for q8h vs q12h Unasyn dosing.    Plan:  Unasyn 3gm IV q8hrs  Follow renal function for any need to adjust dosing interval.  Height: 6' (182.9 cm) Weight: 177 lb (80.3 kg) IBW/kg (Calculated) : 77.6  Temp (24hrs), Avg:98.3 F (36.8 C), Min:97.6 F (36.4 C), Max:99.1 F (37.3 C)  Recent Labs  Lab 11/15/2017 1145  11/01/17 0526 11/02/17 0645 11/02/17 1017 11/03/17 0316 11/04/17 0649  WBC 10.0  --  19.9* 18.7*  --  12.1* 12.8*  CREATININE 1.67*   < > 2.22* 2.26* 2.22* 1.79* 1.68*   < > = values in this interval not displayed.    Estimated Creatinine Clearance: 31.4 mL/min (A) (by C-G formula based on SCr of 1.68 mg/dL (H)).    No Known Allergies  Antimicrobials this admission:  Unasyn 9/19>>  Dose adjustments this admission:  n/a  Microbiology results:  9/13 urine - negative  Thank you for allowing pharmacy to be a part of this patient's care.  Arty Baumgartner, [place] Pager: [phone] or phone: [phone] 11/04/2017 12:50 PM

## 2017-11-04 NOTE — Progress Notes (Signed)
Occupational Therapy Treatment Patient Details Name: Randy [name] MRN: [id number] DOB: August 22, 1924 Today's Date: 11/04/2017    History of present illness Pt is a 84 y.o. M with significant PMH of CKD, HTN, H LD, prior MI status, a.fib on Warfarin presenting with AMS. Has significant dysarthria. CT shows chronic subdural hematomas and early infarct in anteiror left insular cortex. CTA shows no large vessel occlusion.   OT comments  Patient agreeable to OT session.  Limited engagement and required maximal verbal cueing to open eyes throughout session, unable to maintain eyes open.  He attempted to participate in functional reaching with L UE to midline and to face but unsuccessful, requiring total assistance to wash face from bed level.  Provided PROM of R UE, and PROM to AAROM to L UE with noted weakness and deconditioning. Downgraded goals and frequency, but will continue to follow in order to re-assess needs.    Follow Up Recommendations  SNF;Supervision/Assistance - 24 hour    Equipment Recommendations  Other (comment)(defer to next venue of care)    Recommendations for Other Services      Precautions / Restrictions Precautions Precautions: Fall Restrictions Weight Bearing Restrictions: No       Mobility Bed Mobility               General bed mobility comments: supine in bed, repositioned to support R UE   Transfers                      Balance Overall balance assessment: Needs assistance Sitting-balance support: Feet supported;Single extremity supported Sitting balance-Leahy Scale: Zero Sitting balance - Comments: Total assistance for maintaining upright with no trunk/core activation noted. tactile cueing for hand support. Maintained for 8 minutes Postural control: Posterior lean;Right lateral lean                                 ADL either performed or assessed with clinical judgement   ADL Overall ADL's : Needs assistance/impaired      Grooming: Total assistance;Wash/dry face Grooming Details (indicate cue type and reason): pt attempted to reach towards face with L UE, but unable to d/t weakness and coordination                                General ADL Comments: limited session due to lethargy and cognition, completed bed level grooming and UE ROM      Vision   Additional Comments: cueing to maintain eyes open during session with limited success   Perception     Praxis      Cognition Arousal/Alertness: Awake/alert Behavior During Therapy: Flat affect Overall Cognitive Status: Impaired/Different from baseline Area of Impairment: Attention;Following commands;Awareness                   Current Attention Level: Focused   Following Commands: Follows one step commands inconsistently   Awareness: Intellectual   General Comments: pt attempted to commuincate verbally at times, inconsistent and limited clarity of voice         Exercises Exercises: Other exercises Other Exercises Other Exercises: PROM of R UE, PROM to AAROM of L UE    Shoulder Instructions       General Comments bilat LE edema (R >L)    Pertinent Vitals/ Pain       Pain Assessment: Faces Faces Pain Scale:  No hurt Pain Location: unspecified-pain with all movement Pain Descriptors / Indicators: Grimacing;Guarding Pain Intervention(s): Limited activity within patient's tolerance;Monitored during session;Repositioned  Home Living                                          Prior Functioning/Environment              Frequency  Min 1X/week        Progress Toward Goals  OT Goals(current goals can now be found in the care plan section)  Progress towards OT goals: Goals drowngraded-see care plan  Acute Rehab OT Goals Patient Stated Goal: unable to participate in goal setting Time For Goal Achievement: 11/13/17 Potential to Achieve Goals: Fair ADL Goals Pt Will Perform Grooming: with mod  assist;bed level Pt Will Perform Upper Body Bathing: with mod assist;bed level Pt Will Perform Lower Body Bathing: with max assist;bed level Additional ADL Goal #2: Pt will follow 1 step commands consistently in preperation for ADLs.  Plan Discharge plan remains appropriate;Frequency needs to be updated    Co-evaluation                 AM-PAC PT "6 Clicks" Daily Activity     Outcome Measure   Help from another person eating meals?: Total Help from another person taking care of personal grooming?: Total Help from another person toileting, which includes using toliet, bedpan, or urinal?: Total Help from another person bathing (including washing, rinsing, drying)?: Total Help from another person to put on and taking off regular upper body clothing?: Total Help from another person to put on and taking off regular lower body clothing?: Total 6 Click Score: 6    End of Session    OT Visit Diagnosis: Muscle weakness (generalized) (M62.81);Other symptoms and signs involving cognitive function;Cognitive communication deficit (R41.841);Other abnormalities of gait and mobility (R26.89) Symptoms and signs involving cognitive functions: Cerebral infarction   Activity Tolerance Patient limited by fatigue;Patient limited by lethargy   Patient Left in bed;with call bell/phone within reach   Nurse Communication Mobility status        Time: 5183-3582 OT Time Calculation (min): 11 min  Charges: OT General Charges $OT Visit: 1 Visit OT Treatments $Self Care/Home Management : 8-22 mins  Delight Stare, [place] Pager [phone] Office [phone]    Delight Stare 11/04/2017, 2:13 PM

## 2017-11-04 NOTE — Progress Notes (Signed)
Nutrition Follow-up  DOCUMENTATION CODES:   Not applicable  INTERVENTION:  Family desires to continue aggressive medical care for pt.  Within the next 24-48 hours, recommend initiating enteral nutrition (if within goals of care) using Jevity 1.5 formula  at 20 ml/hr and increasing by 10 ml every 4 hours to goal rate of 50 ml/hr.  Regimen to provide 1800 kcal, 77 grams of protein, and 912 ml water.  NUTRITION DIAGNOSIS:   Inadequate oral intake related to inability to eat as evidenced by NPO status; ongoing  GOAL:   Patient will meet greater than or equal to 90% of their needs; not met  MONITOR:   Labs, Weight trends, I & O's, Skin  REASON FOR ASSESSMENT:   Low Braden    ASSESSMENT:   84 y.o. with PMH of prior CVAs, CAD, CKA, A.fib w/ pacemaker, HLD, HTN admit for L MCA CVA.  Pt NPO since admission 9/13. Pt with moderate-severe oropharyngeal dysphagia. Per MD, pt has developed right lower lobe infiltrate and MD reports likely from aspiration. Per Palliative care discussion with son yesterday, decision of continuing with aggressive medical care was chosen which includes enteral nutrition and Cortrak/PEG tube. MD concerned regarding starting enteral nutrition as pt aspirating own secretions and afraid enteral nutrition will exacerbate the risk. Pt with poor prognosis. Will continue to monitor palliative and family discussions.  Labs and medications reviewed. Sodium elevated at 151. Chloride elevated at 124.   Diet Order:   Diet Order            Diet NPO time specified  Diet effective now              EDUCATION NEEDS:   Not appropriate for education at this time  Skin:  Skin Assessment: Skin Integrity Issues: Skin Integrity Issues:: Stage II Stage II: buttocks  Last BM:  Unknown  Height:   Ht Readings from Last 1 Encounters:  10/30/2017 6' (1.829 m)    Weight:   Wt Readings from Last 1 Encounters:  10/19/2017 80.3 kg    Ideal Body Weight:  80.9 kg  BMI:   Body mass index is 24.01 kg/m.  Estimated Nutritional Needs:   Kcal:  1499-6924  Protein:  75-85 grams  Fluid:  1.7 - 1.9 L/day    Corrin Parker, MS, RD, LDN Pager # [phone] After hours/ weekend pager # [phone]

## 2017-11-04 NOTE — Progress Notes (Signed)
Subjective:  Randy [name] is a 84 y.o. with PMH of HTN, HLD, CKD, CVA, prior MI status post stents, A. fib status post pacemaker admitted for left MCA CVA on hospital day 6  Randy [name] was examined at bedside this AM. He was noted to be off his high flow nasal canula. He was observed with significantly increased work of breathing. He is alert to first name but able to follow directions. He was unable to cough when prompted. He still has noticeable gurgling. Points toward his stomach when asked about pain.   Objective:  Vital signs in last 24 hours: Vitals:   11/03/17 2348 11/04/17 0310 11/04/17 1000 11/04/17 1122  BP: (!) 144/60 (!) 158/50 (!) 144/61 (!) 145/81  Pulse: 76 79 70 73  Resp: 20 20 20 20   Temp: 97.6 F (36.4 C) 98.4 F (36.9 C) 98 F (36.7 C) 98.5 F (36.9 C)  TempSrc: Axillary Axillary Axillary Axillary  SpO2: 93% 97% 94% 93%  Weight:      Height:       Physical Exam  Constitutional: He is well-developed, well-nourished, and in no distress. No distress.  HENT:  Head: Normocephalic and atraumatic.  Mouth/Throat: Oropharyngeal exudate (Yellowish sputum observed around mouth and on washcloth) present.  Eyes: Conjunctivae are normal. No scleral icterus.  Dilated left pupil  Cardiovascular: Normal rate, regular rhythm, normal heart sounds and intact distal pulses.  No murmur heard. Pulmonary/Chest: He is in respiratory distress (Increased respiratory effort with accessory muscle use). He has no wheezes.  Genitourinary:  Genitourinary Comments: Foley catheter in place. Foley bag with 300cc of yellow urine  Musculoskeletal: He exhibits edema (1+ pitting edema up to knees.).  Neurological: He is alert.  Oriented x1 to name. Dysarthria unchanged since yesterday. Strength: RUE 0/5, LUE 2/5, RLE 0/5, LLE 1/5. Unable to assess sensation. GCS 14  Skin: He is not diaphoretic.   Assessment/Plan:  Principal Problem:   CVA (cerebral vascular accident) ([place]) Active  Problems:   Hypertension   History of MI (myocardial infarction)   Diabetes mellitus ([place])   Chronic kidney disease (CKD), stage III (moderate) ([name])   Late onset Alzheimer's disease without behavioral disturbance   Acute on chronic intracranial subdural hematoma ([name])   Shortness of breath   Acute ischemic stroke [facility])   Other dysphagia   Advanced care planning/counseling discussion   Goals of care, counseling/discussion   Palliative care by specialist  84 year old male with PMH of HTN, HLD, CKD, CVA, prior MI status post stents, A. fib status post pacemaker admitted for left MCA CVA.   To have fluctuating mentation.  Overnight started producing yellow sputum with increased work of breathing.  Chest x-ray revealed aspiration pneumonia.  Will start antibiotics.  Informed the family of evolving situation however family has not changed their mind about the patient's status and wants to proceed with tube feeds.   Aspiration Pneumonia 2/2 inability to clear airway  Productive sputum on exam. Leukocytosis stable 12.8 <-12.1 Increased work of breathing on exam. Patchy consolidation @ right mid and lower lung. O2sat 92 - Keep NPO, frequent suctioning - Currently on O2 [place] 4.5L, keep sat above 88 - Start Unasyn per pharmacy - Sputum Culture - MRSA PCR  Urinary Retention  Foley in place. Off glycopyrrolate now. 100 cc output recorded. 300 cc in bag. - C/w monitor Output  CVA 2/2leftMCA occlusion Dysarthria preventing accurate assessment of mentation. Alert but minimally responsive. Family agrees to DNR but with full scope of  care and with intubation. - Appreciate palliative recs  -Neuro checks,permissive hypertension -C/w ASA, per neurosurg should not restart Coumadin -C/wAtorvastatin 80 mg PO daily - Sacral ulcer wound care per wound care team  DVT prophx:SCDs Diet: NPO Bowel:Senokot Code:DNR  Dispo: Anticipated discharge in approximately 2 day(s).   [name],  MD 11/04/2017, 3:27 PM Pager: [phone]

## 2017-11-04 NOTE — Progress Notes (Signed)
  Date: 11/04/2017  Patient name: PERL KERNEY  Medical record number: [id number]  Date of birth: June 04, 1924   I have seen and evaluated this patient and I have discussed the plan of care with the house staff. Please see their note for complete details. I concur with their findings with the following additions/corrections: Mr. Windmiller was seen on team morning rounds he was alert but appears very fatigued and is using accessory muscles to breathe.  He is tachypneic and has very wet secretions.  He was unable to cough to clear the secretions.  Due to increased secretions and gurgling last night, a repeat chest x-ray was ordered.  I personally reviewed the chest x-ray image, an AP portable.  He was rotated and there is poor inspiration.  However, there is a right sided infiltrate particularly in the right base.  There are also increased markings in the left base.  He has not had a fever.  He is oxygenating well on 4.5 L of oxygen per nasal cannula.  His leukocytosis remains elevated at 12.8.  Despite n.p.o. status, he has developed a right lower lobe infiltrate, likely aspiration, and also a possible left lower infiltrate.  He continues to have respiratory distress, significant secretions, a weak cough, and appears overall fatigued and weak.  We reviewed palliative care's note.  His son has chosen to continue aggressive medical care including artificial feeding and PEG tube.  He is DNR.  I am very concerned about starting any enteral feeding even artificial feeding as he is already demonstrated he is aspirating on his own secretions and giving tube feeds will only increase this risk.  We have started antibiotics for the aspiration pneumonia and will continue to follow palliative care's discussions with Mr. Osmundson son.  Prognosis is poor.  Bartholomew Crews, MD 11/04/2017, 1:56 PM

## 2017-11-04 NOTE — Care Management Note (Signed)
Case Management Note  Patient Details  Name: Randy [name] MRN: [id number] Date of Birth: 08/19/1924  Subjective/Objective:     Pt admitted with CVA. He is from home. Contact:   Cahlil Sattar at [phone].              Action/Plan: Recommendations are for SNF. CM following for d/c disposition.  Expected Discharge Date:                  Expected Discharge Plan:  Skilled Nursing Facility  In-House Referral:  Clinical Social Work  Discharge planning Services     Post Acute Care Choice:    Choice offered to:     DME Arranged:    DME Agency:     HH Arranged:    [place] Agency:     Status of Service:  In process, will continue to follow  If discussed at Long Length of Stay Meetings, dates discussed:    Additional Comments:  Pollie Friar, RN 11/04/2017, 3:56 PM

## 2017-11-04 NOTE — Progress Notes (Signed)
Physical Therapy Treatment Patient Details Name: Randy [name] MRN: [id number] DOB: May 08, 1924 Today's Date: 11/04/2017    History of Present Illness Pt is a 84 y.o. M with significant PMH of CKD, HTN, H LD, prior MI status, a.fib on Warfarin presenting with AMS. Has significant dysarthria. CT shows chronic subdural hematomas and early infarct in anteiror left insular cortex. CTA shows no large vessel occlusion.    PT Comments    Patient agreeable to participate in therapy. Patient requires total A +2 for bed mobility this session. ROM of all extremities and repositioning performed by therapist with pt attempting at times to assist. All mobility limited by pain. PT will continue to follow acutely and progress as tolerated per POC.   Follow Up Recommendations  SNF;Supervision/Assistance - 24 hour     Equipment Recommendations  Other (comment)(defer)    Recommendations for Other Services       Precautions / Restrictions Precautions Precautions: Fall Restrictions Weight Bearing Restrictions: No    Mobility  Bed Mobility               General bed mobility comments: total A +2 to reposition in bed   Transfers                    Ambulation/Gait                 Stairs             Wheelchair Mobility    Modified Rankin (Stroke Patients Only) Modified Rankin (Stroke Patients Only) Pre-Morbid Rankin Score: Severe disability Modified Rankin: Severe disability     Balance Overall balance assessment: Needs assistance Sitting-balance support: Feet supported;Single extremity supported Sitting balance-Leahy Scale: Zero Sitting balance - Comments: Total assistance for maintaining upright with no trunk/core activation noted. tactile cueing for hand support. Maintained for 8 minutes Postural control: Posterior lean;Right lateral lean                                  Cognition Arousal/Alertness: Awake/alert Behavior During Therapy:  Flat affect                                   General Comments: pt attempted to communicate verbally however it appeared that secretions limited clarity of voice; pt did respond with head nods       Exercises Other Exercises Other Exercises: PROM of all extremities-limited by pain    General Comments General comments (skin integrity, edema, etc.): bilat LE edema (R >L)      Pertinent Vitals/Pain Pain Assessment: Faces Faces Pain Scale: Hurts even more Pain Location: unspecified-pain with all movement Pain Descriptors / Indicators: Grimacing;Guarding Pain Intervention(s): Limited activity within patient's tolerance;Monitored during session;Repositioned    Home Living                      Prior Function            PT Goals (current goals can now be found in the care plan section) Progress towards PT goals: Not progressing toward goals - comment    Frequency    Min 2X/week      PT Plan Current plan remains appropriate    Co-evaluation              AM-PAC PT "6 Clicks" Daily Activity  Outcome Measure  Difficulty turning over in bed (including adjusting bedclothes, sheets and blankets)?: Unable Difficulty moving from lying on back to sitting on the side of the bed? : Unable Difficulty sitting down on and standing up from a chair with arms (e.g., wheelchair, bedside commode, etc,.)?: Unable Help needed moving to and from a bed to chair (including a wheelchair)?: Total Help needed walking in hospital room?: Total Help needed climbing 3-5 steps with a railing? : Total 6 Click Score: 6    End of Session Equipment Utilized During Treatment: Oxygen Activity Tolerance: Patient limited by fatigue;Patient limited by pain Patient left: in bed;with call bell/phone within reach Nurse Communication: Mobility status;Need for lift equipment PT Visit Diagnosis: Other symptoms and signs involving the nervous system (R29.898);Hemiplegia and  hemiparesis;Other abnormalities of gait and mobility (R26.89);Muscle weakness (generalized) (M62.81) Hemiplegia - Right/Left: Right Hemiplegia - dominant/non-dominant: Non-dominant     Time: 2947-6546 PT Time Calculation (min) (ACUTE ONLY): 18 min  Charges:  $Therapeutic Activity: 8-22 mins                     Earney Navy, PTA Acute Rehabilitation Services Pager: [phone] Office: [phone]     Darliss Cheney 11/04/2017, 12:26 PM

## 2017-11-05 DIAGNOSIS — R627 Adult failure to thrive: Secondary | ICD-10-CM

## 2017-11-05 DIAGNOSIS — E871 Hypo-osmolality and hyponatremia: Secondary | ICD-10-CM

## 2017-11-05 DIAGNOSIS — E878 Other disorders of electrolyte and fluid balance, not elsewhere classified: Secondary | ICD-10-CM

## 2017-11-05 DIAGNOSIS — Z9189 Other specified personal risk factors, not elsewhere classified: Secondary | ICD-10-CM

## 2017-11-05 DIAGNOSIS — E876 Hypokalemia: Secondary | ICD-10-CM

## 2017-11-05 DIAGNOSIS — E870 Hyperosmolality and hypernatremia: Secondary | ICD-10-CM

## 2017-11-05 LAB — GLUCOSE, CAPILLARY
Glucose-Capillary: 180 mg/dL — ABNORMAL HIGH (ref 70–99)
Glucose-Capillary: 144 mg/dL — ABNORMAL HIGH (ref 70–99)
Glucose-Capillary: 123 mg/dL — ABNORMAL HIGH (ref 70–99)
Glucose-Capillary: 109 mg/dL — ABNORMAL HIGH (ref 70–99)
Glucose-Capillary: 122 mg/dL — ABNORMAL HIGH (ref 70–99)
Glucose-Capillary: 106 mg/dL — ABNORMAL HIGH (ref 70–99)

## 2017-11-05 LAB — BASIC METABOLIC PANEL
Anion gap: 11 (ref 5–15)
Anion gap: 9 (ref 5–15)
Anion gap: 14 (ref 5–15)
BUN: 36 mg/dL — ABNORMAL HIGH (ref 8–23)
BUN: 35 mg/dL — AB (ref 8–23)
BUN: 37 mg/dL — AB (ref 8–23)
CALCIUM: 8.5 mg/dL — AB (ref 8.9–10.3)
CHLORIDE: 126 mmol/L — AB (ref 98–111)
CHLORIDE: 127 mmol/L — AB (ref 98–111)
CO2: 18 mmol/L — ABNORMAL LOW (ref 22–32)
CO2: 19 mmol/L — ABNORMAL LOW (ref 22–32)
CO2: 16 mmol/L — ABNORMAL LOW (ref 22–32)
CREATININE: 1.67 mg/dL — AB (ref 0.61–1.24)
CREATININE: 1.79 mg/dL — AB (ref 0.61–1.24)
Calcium: 8.3 mg/dL — ABNORMAL LOW (ref 8.9–10.3)
Calcium: 8.3 mg/dL — ABNORMAL LOW (ref 8.9–10.3)
Chloride: 127 mmol/L — ABNORMAL HIGH (ref 98–111)
Creatinine, Ser: 1.7 mg/dL — ABNORMAL HIGH (ref 0.61–1.24)
GFR calc Af Amer: 39 mL/min — ABNORMAL LOW (ref >60)
GFR calc Af Amer: 40 mL/min — ABNORMAL LOW (ref >60)
GFR calc Af Amer: 36 mL/min — ABNORMAL LOW (ref >60)
GFR calc non Af Amer: 33 mL/min — ABNORMAL LOW (ref >60)
GFR calc non Af Amer: 31 mL/min — ABNORMAL LOW (ref >60)
GFR, EST NON AFRICAN AMERICAN: 34 mL/min — AB (ref >60)
GLUCOSE: 123 mg/dL — AB (ref 70–99)
Glucose, Bld: 191 mg/dL — ABNORMAL HIGH (ref 70–99)
Glucose, Bld: 145 mg/dL — ABNORMAL HIGH (ref 70–99)
POTASSIUM: 3.3 mmol/L — AB (ref 3.5–5.1)
Potassium: 2.8 mmol/L — ABNORMAL LOW (ref 3.5–5.1)
Potassium: 3.8 mmol/L (ref 3.5–5.1)
SODIUM: 155 mmol/L — AB (ref 135–145)
SODIUM: 155 mmol/L — AB (ref 135–145)
Sodium: 157 mmol/L — ABNORMAL HIGH (ref 135–145)

## 2017-11-05 LAB — CBC
HCT: 39.3 % (ref 39.0–52.0)
HEMOGLOBIN: 12.5 g/dL — AB (ref 13.0–17.0)
MCH: 28.6 pg (ref 26.0–34.0)
MCHC: 31.8 g/dL (ref 30.0–36.0)
MCV: 89.9 fL (ref 78.0–100.0)
Platelets: 389 10*3/uL (ref 150–400)
RBC: 4.37 MIL/uL (ref 4.22–5.81)
RDW: 15.9 % — ABNORMAL HIGH (ref 11.5–15.5)
WBC: 13.8 10*3/uL — ABNORMAL HIGH (ref 4.0–10.5)

## 2017-11-05 MED ORDER — FUROSEMIDE 10 MG/ML IJ SOLN
20.0000 mg | Freq: Once | INTRAMUSCULAR | Status: AC
  Administered 2017-11-05: 20 mg via INTRAVENOUS
  Filled 2017-11-05: qty 4

## 2017-11-05 MED ORDER — POTASSIUM CHLORIDE 10 MEQ/100ML IV SOLN
10.0000 meq | INTRAVENOUS | Status: AC
  Administered 2017-11-05 (×4): 10 meq via INTRAVENOUS
  Filled 2017-11-05 (×4): qty 100

## 2017-11-05 MED ORDER — DEXTROSE 5 % IV SOLN
INTRAVENOUS | Status: DC
  Administered 2017-11-05: 10:00:00 via INTRAVENOUS

## 2017-11-05 NOTE — Progress Notes (Signed)
  Date: 11/05/2017  Patient name: Randy [name]  Medical record number: [id number]  Date of birth: 1924/08/18   I have seen and evaluated this patient and I have discussed the plan of care with the house staff. Please see their note for complete details. I concur with their findings with the following additions/corrections: Randy [name] was seen on team morning rounds.  He appears very fatigued and is using accessory muscles to breathe and is quite tachypneic. He was on nasal cannula but was mouth breathing and was satting in the mid 80s.  We moved the O2 sat to his mouth and he increased to 88 to 89% despite 6 L of oxygen.  We then transition him to a Venturi mask and he increased his O2 sats to 90% on 6 L.  He had only 300 cc of urine documented yesterday despite IV fluid administration.  He has a Foley catheter and it is draining urine so this is not an issue of retention.  He has minimal to no pitting edema but he is developing crackles particularly in his dependent lung base.  He is able to keep his eyes open throughout the examination.  He focuses on the speaker and can track his eyes when the speaker changes.  He follows commands with his left arm.  He remains n.p.o.  His hyponatremia has worsened and his sodium is now 155 despite IV fluids.  The team discussed with Randy [name] that he had a stroke.  He nodded that he understood he had a stroke.  We then discussed that the stroke has affected his ability to safely swallow and he nodded he understood this.  We informed him that he had a pneumonia and when asked if he knew he had a pneumonia he shook his head no. Dr. Truman Hayward just diagnosed it yesterday.  We discussed that his son wanted a tube to to go in his nose to his stomach to give him nutrition.  Dr. Truman Hayward briefly discussed the risk including discomfort and further aspiration.  When asked whether he agreed with his son's decision for the feeding tube, he did not answer for appr 2 minutes but then  shook his head no that he did not want a feeding tube.  It is my clinical assessment that a feeding tube will be detrimental to Randy [name].  It will cause discomfort and will further increase his risk of aspiration.  I do not believe that it will change his underlying trajectory.  My best medical assessment is that Randy [name] will not recover from the stroke, oropharyngeal dysphasia, and aspiration pneumonia.  It is my assessment that Randy [name] comprehended all that we told him.  It is also my assessment that Randy [name] can make decisions for his medical care.  We would only utilize his family members (son) as surrogate decision makers if Randy [name] is unable to make his own decisions.  This is not the case per our assessment today.  Palliative care has also been assessing the patient and we will discuss this further when they are available.  Bartholomew Crews, MD 11/05/2017, 11:43 AM

## 2017-11-05 NOTE — Progress Notes (Addendum)
Subjective:  Randy [name] is a 84 y.o. with PMH of PMH of HTN, HLD, CKD, CVA, prior MI status post stents, A. fib status post pacemaker  admit for L MCA CVA on hospital day 7  Randy [name] was examined and evaluated at bedside this AM. He states he feels alright. He was observed continuing to use accessory muscles to breathe and his O2 sat was in low 80s at 5L [place]. He was also observed coughing but not producing sputum. He oxygenation improved after he was bumped up to 6L O2 and nasal cannula was changed to face mask. He was observed answering questions with head nods and shakes. Explained to the Randy [name] about benefits and risks of tube feeds especially with his noted aspiration pneumonia. When asked 'do you want [place] to give you food through feeding tubes?' He shook his head No after considerable deliberation. He shook his head No again after we repeated the question. Denies any significant pain.  Spoke with son about risks of aspiration when on tube feeds and our assessment that the patient is refusing tube feeds by shaking his head.  Patient's son disagrees with our assessment and believes that his father is unable to make medical decisions. Unfortunately he cannot be at bedside today due to his work. Palliative states they will reach out to him.   Objective:  Vital signs in last 24 hours: Vitals:   11/04/17 2301 11/04/17 2336 11/05/17 0445 11/05/17 0736  BP:  (!) 139/49 (!) 135/98 (!) 147/67  Pulse: 83 63 86 61  Resp: (!) 22 (!) 24 (!) 25 19  Temp:  97.7 F (36.5 C) (!) 97.4 F (36.3 C) 97.9 F (36.6 C)  TempSrc:  Oral Oral Oral  SpO2: 93% 96% 90%   Weight:      Height:       Physical Exam  Constitutional: No distress.  HENT:  Mouth/Throat: Oropharyngeal exudate (whitish sputum drained through suction) present.  Speaking with gurgling voice  Eyes: Conjunctivae are normal. No scleral icterus.  Dilated left pupil  Neck: No JVD present.  Cardiovascular: Normal rate, regular  rhythm and intact distal pulses.  Pulmonary/Chest: He is in respiratory distress. He has rales (crackles on right lower lobe).  Abdominal: Soft. Bowel sounds are normal. He exhibits no distension. There is no tenderness. There is no guarding.  Genitourinary:  Genitourinary Comments: Foley in place 400cc of yellow urine in bag  Musculoskeletal: Normal range of motion. He exhibits no edema or deformity.  Lymphadenopathy:    He has no cervical adenopathy.  Neurological: He is alert.  AAO x1 possibly to name. Difficult to understand due to dysarthria. Strength: RUE 0/5, LUE 3/5, RLE 0/5, LLE 1/5. Unable to assess sensation.   Skin: Skin is warm and dry. He is not diaphoretic.   Assessment/Plan:  Principal Problem:   CVA (cerebral vascular accident) ([place]) Active Problems:   Hypertension   History of MI (myocardial infarction)   Diabetes mellitus ([name])   Chronic kidney disease (CKD), stage III (moderate) ([name])   Late onset Alzheimer's disease without behavioral disturbance   Acute on chronic intracranial subdural hematoma ([name])   Shortness of breath   Acute ischemic stroke [facility])   Other dysphagia   Advanced care planning/counseling discussion   Goals of care, counseling/discussion   Palliative care by specialist   84 year old male with PMH of HTN, HLD, CKD, CVA, prior MI status post stents, A. fib status post pacemaker admitted for left MCA CVA. Admitted on the 13th.  Has been NPO for 7 days and require nutritional support.  New to have ongoing discussion about risk-benefit of tube feeding.  Son wants patient to start to feed but patient himself appears to refuse. Will clarify patient's decision making capability with palliative.  Hypernatremia, Hyperchloremia, Hypokalemia 2/2 failure to thrive vs dehydration Na 155, K 3.3, Cl 126 Renal function intact Creatinine 1.7 which is his baseline. GFR 33 - Will start tube feeds after clarification with palliative - Started on D5W at 200cc/hr,  but observed having worsening dyspnea - STOP D5W - 1 time KCl 75mEq x4 - Strict I&Os  Aspiration Pneumonia 2/2 inability to clear airway  Increasing leukocytosis stable 13.8 <-12.8 Continuing to have Increased work of breathing on exam. Patchy consolidation @ right mid and lower lung on X-ray. O2sat 90 at 6L MRSA PCR negative. Crackles on exam - 1 time dose of Lasix 20mg  - Keep NPO, frequent suctioning - Currently on O2 6L with facemask, keep sat above 88 - C/w Unasyn per pharmacy - Sputum Culture  Urinary Retention  Foley in place. Off glycopyrrolate now. 300cc output recorded last 24 hours. 400 cc in bag. - C/w monitor Output  CVA 2/2leftMCA occlusion Dysarthria preventing accurate assessment of mentation. Alert and following directions but unable to speak. Family agrees to DNR but with full scope of care and with intubation. -Appreciate palliative recs  -Neuro checks,permissive hypertension -C/w ASA, per neurosurg should not restart Coumadin -C/wAtorvastatin 80 mg PO daily - Sacral ulcer wound care per wound care team  DVT prophx:SCDs Diet: NPO Bowel:Senokot Code:DNR  Dispo: Anticipated discharge in approximately 4 day(s).   Randy Anis, MD 11/05/2017, 11:01 AM Pager: [phone]

## 2017-11-05 NOTE — Progress Notes (Signed)
SLP Cancellation Note  Patient Details Name: Randy [name] MRN: [id number] DOB: August 05, 1924   Cancelled treatment:       Reason Eval/Treat Not Completed: Patient not medically ready. Following via chart, agree with Dr. Zenovia Jarred assessment. Pt continues to be at high risk of aspiration and inappropriate for PO intake or interventions at this time other than comfort measures.   Herbie [place], [place] CCC-SLP [phone]  Lynann Beaver 11/05/2017, 12:04 PM

## 2017-11-05 NOTE — Progress Notes (Signed)
CSW following for discharge plan. Noting per chart review that patient not medically stable for SNF placement at this time, and continuing to work on decision for aggressive care versus comfort measures.   CSW to continue to follow for needs.  Laveda Abbe, [place] Clinical Social Worker [phone]

## 2017-11-05 NOTE — Progress Notes (Signed)
Daily Progress Note   Patient Name: Randy [name]       Date: 11/05/2017 DOB: 02-18-1924  Age: 84 y.o. MRN#: [id number] Attending Physician: Bartholomew Crews, MD Primary Care Physician: System, Pcp Not In Admit Date: 11/14/2017  Reason for Consultation/Follow-up: Establishing goals of care  Subjective: Upon arrival to room, patient wakes to voice. Aphasic but able to nod yes/no to simple questions. Attempted to talk. Audible secretions. Currently on NRB.   No family at bedside.   Length of Stay: 7  Current Medications: Scheduled Meds:  . aspirin EC  325 mg Oral Daily   Or  . aspirin  300 mg Rectal Daily  . chlorhexidine  15 mL Mouth Rinse BID  . heparin injection (subcutaneous)  5,000 Units Subcutaneous Q8H  . insulin aspart  0-15 Units Subcutaneous Q4H  . insulin detemir  5 Units Subcutaneous QHS  . mouth rinse  15 mL Mouth Rinse q12n4p    Continuous Infusions: . ampicillin-sulbactam (UNASYN) IV Stopped (11/05/17 1416)  . dextrose Stopped (11/05/17 1606)    PRN Meds:  Physical Exam  Constitutional: He is easily aroused. He appears ill.  HENT:  Head: Normocephalic and atraumatic.  Pulmonary/Chest: He has decreased breath sounds.  Audible secretions. Dyspnea at rest. NRB.  Neurological: He is alert and easily aroused.  Aphasic  Skin: Skin is warm and dry. There is pallor.  Nursing note and vitals reviewed.          Vital Signs: BP (!) 128/49 (BP Location: Left Arm)   Pulse (!) 58   Temp 97.7 F (36.5 C) (Axillary)   Resp (!) 22   Ht 6' (1.829 m)   Wt 80.3 kg   SpO2 97%   BMI 24.01 kg/m  SpO2: SpO2: 97 % O2 Device: O2 Device: (S) NRB(Placed on NRB due to SAT's of 87% on 55% VM. MD aware. ) O2 Flow Rate: O2 Flow Rate (L/min): 15 L/min  Intake/output  summary:   Intake/Output Summary (Last 24 hours) at 11/05/2017 1716 Last data filed at 11/05/2017 1624 Gross per 24 hour  Intake 681.19 ml  Output 1150 ml  Net -468.81 ml   LBM: Last BM Date: 11/04/17 Baseline Weight: Weight: 80.3 kg Most recent weight: Weight: 80.3 kg       Palliative Assessment/Data: PPS 20%   Flowsheet  Rows     Most Recent Value  Intake Tab  Unit at Time of Referral  Med/Surg Unit  Palliative Care Primary Diagnosis  Neurology  Date Notified  11/01/17  Palliative Care Type  New Palliative care  Reason for referral  Clarify Goals of Care  Date of Admission  11/13/2017  Date first seen by Palliative Care  11/02/17  # of days Palliative referral response time  1 Day(s)  # of days IP prior to Palliative referral  3  Clinical Assessment  Psychosocial & Spiritual Assessment  Palliative Care Outcomes      Patient Active Problem List   Diagnosis Date Noted  . Acute ischemic stroke ([place])   . Other dysphagia   . Advanced care planning/counseling discussion   . Goals of care, counseling/discussion   . Palliative care by specialist   . Shortness of breath   . Acute on chronic intracranial subdural hematoma ([name]) 10/30/2017  . CVA (cerebral vascular accident) ([place]) 10/23/2017  . Acute on chronic renal failure ([place])   . Uncontrolled type 2 diabetes mellitus with hyperglycemia, without long-term current use of insulin ([place]) 06/27/2016  . Acute blood loss anemia   . Leg DVT (deep venous thromboembolism), acute, bilateral ([place]) 06/24/2016  . Acute renal failure superimposed on stage 3 chronic kidney disease ([place]) 06/24/2016  . Staphylococcus aureus bacteremia with sepsis ([place])   . Pacemaker infection ([place])   . Enterococcal bacteremia   . Late onset Alzheimer's disease without behavioral disturbance   . Pressure injury of skin 06/18/2016  . Cough 06/17/2016  . Leukocytosis 06/17/2016  . Pacemaker 06/17/2016  . 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 History of infection due to ESBL Escherichia coli   . Chronic kidney disease (CKD), stage III (moderate) ([place]) 04/20/2014  . Prosthetic joint infection ([place]) 04/20/2014  . Septic joint of right knee joint ([place]) 04/19/2014  . Pyelonephritis 03/11/2014  . Sepsis ([place]) 01/04/2014  . Diabetes mellitus without complication ([place])   . Diabetes mellitus ([place]) 12/21/2013  . Acute UTI 12/20/2013  . History of MI (myocardial infarction) 12/20/2013  . UTI (lower urinary tract infection) 12/20/2013  . Elevated brain natriuretic peptide (BNP) level 12/20/2013  . Hyperglycemia 12/20/2013  . Acute kidney injury ([place]) 12/20/2013  . Decubitus ulcer 12/20/2013  . History of DVT (deep vein thrombosis) 12/20/2013  . Hyperlipidemia   . Hypertension   . Anxiety   . Essential hypertension   . Weakness generalized     Palliative Care Assessment & Plan   Patient Profile: 84 y.o. male  with past medical history of CKD, HTN, HLD, previous MI, CVA, A. fib  admitted on 11/05/2017 with altered mental status, R side flaccid, R facial droop not following commands or talking. Initial workup revealed chronic subdural hematomas with CSF collections of mixed density worrisome for some acute hemmorhage, possible early insular infarct. CTA showed no large vessel occlusion, M1 occlusion with poor collaterals significant cerebral artery disease, multiple vessels with moderate stenosis- surgery was consulted with no recommendations for intervention. Repeat CT scan on 9/15 showed evolving Left insula and ganglia nonhemorrhagic infarct, and R parietal infarct, stable mixed density subdural collections. Clinically, patient is not progressing. SLP eval on 9/15 noted suspected silent aspiration, recommended NPO with ice chips only. RR event on 9/15 pm significant for worsening clinical status worrisome for  patient's ability to protect his airway.  He continues to have copious oral secretions and is unable to take in anything by mouth. Palliative medicine consulted for [place].  Assessment: Left MCA CVA Dysarthria Dysphagia Aspiration pneumonia At high risk for aspiration Dehydration Failure to thrive  Recommendations/Plan:  Continue current interventions. No feeding tube per patient wishes after conversation with attending this morning.   No family at bedside. Will need further [place] conversation with patient and family when family present. Son is not local and will not be at the hospital today. PMT hopeful to follow-up with patient/son this weekend. PMT provider will follow.   Poor prognosis and high risk for decompensation.  Code Status: Partial   Code Status Orders  (From admission, onward)         Start     Ordered   11/04/17 1703  Limited resuscitation (code)  Continuous    Question Answer Comment  In the event of cardiac or respiratory ARREST: Initiate Code Blue, Call Rapid Response Yes   In the event of cardiac or respiratory ARREST: Perform CPR No   In the event of cardiac or respiratory ARREST: Perform Intubation/Mechanical Ventilation Yes   In the event of cardiac or respiratory ARREST: Use NIPPV/BiPAp only if indicated Yes   In the event of cardiac or respiratory ARREST: Administer ACLS medications if indicated No   In the event of cardiac or respiratory ARREST: Perform Defibrillation or Cardioversion if indicated No      11/04/17 1706        Code Status History    Date Active Date Inactive Code Status Order ID Comments User Context   11/13/2017 1432 11/04/2017 1706 Full Code [id number]  [name], MD ED   06/17/2016 2335 07/06/2016 1648 Full Code [id number]  Lily Kocher, MD ED   06/11/2014 1904 06/15/2014 2038 Full Code [id number]  Leighton Parody, PA-C Inpatient   04/20/2014 1804 04/24/2014 2104 Full Code [id number]  Leighton Parody, PA-C Inpatient   04/19/2014 2305 04/20/2014  1804 Full Code [id number]  Juluis Mire, MD Inpatient   03/11/2014 1450 03/14/2014 1917 Full Code [id number]  Reyne Dumas, MD ED   01/04/2014 0347 01/09/2014 2028 Full Code [id number]  Ivor Costa, MD Inpatient   12/20/2013 0513 12/23/2013 2045 Full Code [id number]  Ivor Costa, MD Inpatient       Prognosis:   Unable to determine: guarded to poor secondary to left MCA stroke, dysphagia, high risk for aspiration, and aspiration pneumonia.   Discharge Planning:  To Be Determined  Care plan was discussed with IMTS team, patient, RN  Thank you for allowing the Palliative Medicine Team to assist in the care of this patient.   Time In: 1305 Time Out: 1330 Total Time 50min Prolonged Time Billed no      Greater than 50%  of this time was spent counseling and coordinating care related to the above assessment and plan.  Ihor Dow, FNP-C Palliative Medicine Team  Phone: [phone] Fax: [phone]  Please contact Palliative Medicine Team phone at [phone] for questions and concerns.

## 2017-11-06 LAB — CBC
HCT: 43 % (ref 39.0–52.0)
Hemoglobin: 13.2 g/dL (ref 13.0–17.0)
MCH: 27.9 pg (ref 26.0–34.0)
MCHC: 30.7 g/dL (ref 30.0–36.0)
MCV: 90.9 fL (ref 78.0–100.0)
PLATELETS: 380 10*3/uL (ref 150–400)
RBC: 4.73 MIL/uL (ref 4.22–5.81)
RDW: 16.3 % — AB (ref 11.5–15.5)
WBC: 18.3 10*3/uL — ABNORMAL HIGH (ref 4.0–10.5)

## 2017-11-06 LAB — BASIC METABOLIC PANEL
Anion gap: 12 (ref 5–15)
BUN: 40 mg/dL — AB (ref 8–23)
CALCIUM: 8.3 mg/dL — AB (ref 8.9–10.3)
CO2: 18 mmol/L — ABNORMAL LOW (ref 22–32)
Chloride: 128 mmol/L — ABNORMAL HIGH (ref 98–111)
Creatinine, Ser: 1.85 mg/dL — ABNORMAL HIGH (ref 0.61–1.24)
GFR calc Af Amer: 35 mL/min — ABNORMAL LOW (ref >60)
GFR, EST NON AFRICAN AMERICAN: 30 mL/min — AB (ref >60)
GLUCOSE: 124 mg/dL — AB (ref 70–99)
Potassium: 3.3 mmol/L — ABNORMAL LOW (ref 3.5–5.1)
Sodium: 158 mmol/L — ABNORMAL HIGH (ref 135–145)

## 2017-11-06 LAB — GLUCOSE, CAPILLARY
Glucose-Capillary: 121 mg/dL — ABNORMAL HIGH (ref 70–99)
Glucose-Capillary: 99 mg/dL (ref 70–99)
Glucose-Capillary: 115 mg/dL — ABNORMAL HIGH (ref 70–99)
Glucose-Capillary: 127 mg/dL — ABNORMAL HIGH (ref 70–99)
Glucose-Capillary: 149 mg/dL — ABNORMAL HIGH (ref 70–99)

## 2017-11-06 MED ORDER — HALOPERIDOL 0.5 MG PO TABS
0.5000 mg | ORAL_TABLET | ORAL | Status: DC | PRN
  Filled 2017-11-06: qty 1

## 2017-11-06 MED ORDER — IPRATROPIUM-ALBUTEROL 0.5-2.5 (3) MG/3ML IN SOLN
3.0000 mL | Freq: Four times a day (QID) | RESPIRATORY_TRACT | Status: DC
  Administered 2017-11-06: 3 mL via RESPIRATORY_TRACT
  Filled 2017-11-06 (×2): qty 3

## 2017-11-06 MED ORDER — ONDANSETRON HCL 4 MG/2ML IJ SOLN: 4.0000 mg | Freq: Four times a day (QID) | INTRAMUSCULAR | Status: DC | PRN

## 2017-11-06 MED ORDER — DIPHENHYDRAMINE HCL 50 MG/ML IJ SOLN: 12.5000 mg | INTRAMUSCULAR | Status: DC | PRN

## 2017-11-06 MED ORDER — ACETAMINOPHEN 650 MG RE SUPP: 650.0000 mg | Freq: Four times a day (QID) | RECTAL | Status: DC | PRN

## 2017-11-06 MED ORDER — HALOPERIDOL LACTATE 2 MG/ML PO CONC
0.5000 mg | ORAL | Status: DC | PRN
  Filled 2017-11-06: qty 0.3

## 2017-11-06 MED ORDER — ACETAMINOPHEN 325 MG PO TABS: 650.0000 mg | ORAL_TABLET | Freq: Four times a day (QID) | ORAL | Status: DC | PRN

## 2017-11-06 MED ORDER — SODIUM CHLORIDE 0.9 % IV SOLN
3.0000 g | Freq: Two times a day (BID) | INTRAVENOUS | Status: DC
  Administered 2017-11-07: 3 g via INTRAVENOUS
  Filled 2017-11-06: qty 3

## 2017-11-06 MED ORDER — LORAZEPAM 1 MG PO TABS: 1.0000 mg | ORAL_TABLET | ORAL | Status: DC | PRN

## 2017-11-06 MED ORDER — POTASSIUM CHLORIDE 10 MEQ/100ML IV SOLN
10.0000 meq | INTRAVENOUS | Status: AC
  Administered 2017-11-06 (×4): 10 meq via INTRAVENOUS
  Filled 2017-11-06 (×4): qty 100

## 2017-11-06 MED ORDER — FUROSEMIDE 10 MG/ML IJ SOLN
20.0000 mg | Freq: Once | INTRAMUSCULAR | Status: AC
  Administered 2017-11-06: 20 mg via INTRAVENOUS

## 2017-11-06 MED ORDER — ONDANSETRON 4 MG PO TBDP: 4.0000 mg | ORAL_TABLET | Freq: Four times a day (QID) | ORAL | Status: DC | PRN

## 2017-11-06 MED ORDER — FUROSEMIDE 20 MG PO TABS
20.0000 mg | ORAL_TABLET | Freq: Once | ORAL | Status: DC
  Filled 2017-11-06: qty 1

## 2017-11-06 MED ORDER — MORPHINE SULFATE (PF) 2 MG/ML IV SOLN: 1.0000 mg | INTRAVENOUS | Status: DC | PRN

## 2017-11-06 MED ORDER — HALOPERIDOL LACTATE 5 MG/ML IJ SOLN: 0.5000 mg | INTRAMUSCULAR | Status: DC | PRN

## 2017-11-06 MED ORDER — LORAZEPAM 2 MG/ML PO CONC: 1.0000 mg | ORAL | Status: DC | PRN

## 2017-11-06 MED ORDER — FUROSEMIDE 10 MG/ML IJ SOLN
20.0000 mg | Freq: Every day | INTRAMUSCULAR | Status: DC
  Administered 2017-11-06: 20 mg via INTRAVENOUS
  Filled 2017-11-06 (×2): qty 4

## 2017-11-06 MED ORDER — SENNA 8.6 MG PO TABS: 1.0000 | ORAL_TABLET | Freq: Every evening | ORAL | Status: DC | PRN

## 2017-11-06 MED ORDER — LORAZEPAM 2 MG/ML IJ SOLN: 1.0000 mg | INTRAMUSCULAR | Status: DC | PRN

## 2017-11-06 NOTE — Progress Notes (Signed)
I have discussed patient with attending, Dr. Lynnae January and also Dr. Rowe Pavy PMT provider. Call placed to PMT medical director, Dr. Lane Hacker. Awaiting return call from Dr. Hilma Favors.   Also discussed with Dr. Truman Hayward, who just spoke with son. Son plans to be at the hospital tomorrow, 9/22. Encouraged Dr. Truman Hayward to notify PMT provider when son arrives tomorrow. PMT willing to participate in multidisciplinary meeting tomorrow.   NO CHARGE  Ihor Dow, FNP-C Palliative Medicine Team  Phone: [phone] Fax: [phone]

## 2017-11-06 NOTE — Progress Notes (Addendum)
Spoke with patient's son, [name], on the phone about his father's condition. Described the course of his progress since his last visit on Wednesday including worsening respiratory status, aspiration pneumonia and patient's inability to tolerate fluids. Described the course of attempting to correct his electrolyte imbalances and failing to do that due to his respiratory status and how that's further evidence of patient's tenuous status. Validated Eric's feelings of anger, guilt, frustration. [name] expressed understanding and stated he feels he is well informed. He stated he will come by the hospital tomorrow to re-evaluate code status and goals of care in person.  10 minutes after this conversation, was paged about Mr.Rosenberg's respiratory status with desaturation events down to mid 80s and HR climbing up to 140s. Examined the pt at bedside. Found to have increased crackles on his lungs up 2/3 way up the thorax. Stopped fluids and ordered another dose of Lasix. Oxygenation improved to low 90s but HR continuing to be high after RT suctioned mucus plug from his nasopharynx. Paged rapid response. Called the patient's son again to inform him of the updated status and urged him to come down to the hospital now and asked about comfort care. He stated he would like to see the patient in person to make that decision. Stated he would drive down tonight.

## 2017-11-06 NOTE — Progress Notes (Signed)
Voicemail left for son, Baylon Santelli. Awaiting return call to further discuss [place].   NO CHARGE  Ihor Dow, FNP-C Palliative Medicine Team  Phone: [phone] Fax: [phone]

## 2017-11-06 NOTE — Progress Notes (Signed)
Called by IM team to assist with patient with resp distress needing NTS.  Dr. Truman Hayward and Dr. Trilby Drummer at bedside along with RN Nira Conn.  RT present and just suctioned large amounts of thick yellow sputum.  Patient using accessory muscles - very weak - staff reports very weak cough.  RR 40's.  On NRB mask O2 sats had dropped - now trying to recover but slowly. Bil BS coarse rhonchi throughout - worse on the right - left basilar crackles - Lasix given by Nira Conn RN per order MD. Repositioned in bed.   Patient more lethargic per staff.  Opens eyes briefly to name.  Not following commands.  Afib rate 130-140.  BP 108/49.  Dr. Truman Hayward speaking repeatedly with son.  Remains partial code = intubation only - son wishing to see patient before he makes decision TU:UEKCMKL measures.  Dr. Truman Hayward and Dr. Trilby Drummer present - tenuous situation.  Will continue to follow with RT and staff.  Handoff to [employer].

## 2017-11-06 NOTE — Progress Notes (Signed)
Pharmacy Antibiotic Note - follow-up  Randy [name] is a 84 y.o. male on Unasyn for aspiration pneumonia.  Patients renal function declining over past few days, slight urine output past two days.   Decreasing frequency of antibiotic administration with regard to current creatinine clearance.  Plan:  Unasyn 3gm IV q12hrs  Follow clinical improvement, s/sx of infection, renal function.  Height: 6' (182.9 cm) Weight: 177 lb (80.3 kg) IBW/kg (Calculated) : 77.6  Temp (24hrs), Avg:98.1 F (36.7 C), Min:97.7 F (36.5 C), Max:98.5 F (36.9 C)  Recent Labs  Lab 11/02/17 0645  11/03/17 0316 11/04/17 0649 11/05/17 0612 11/05/17 1126 11/05/17 1846 11/06/17 0605  WBC 18.7*  --  12.1* 12.8* 13.8*  --   --  18.3*  CREATININE 2.26*   < > 1.79* 1.68* 1.70* 1.67* 1.79* 1.85*   < > = values in this interval not displayed.    Estimated Creatinine Clearance: 28.5 mL/min (A) (by C-G formula based on SCr of 1.85 mg/dL (H)).    No Known Allergies  Antimicrobials this admission:  Unasyn 9/19>>  Dose adjustments this admission:  Unasyn 3g q8h >> 3g q12h  Microbiology results:  9/13 urine - negative   Thank you for allowing pharmacy to be a part of this patient's care.  Tamela Gammon, PharmD 11/06/2017 2:07 PM PGY-1 Pharmacy Resident Direct Phone: [phone] Please check [email or web address] for unit-specific pharmacist phone numbers

## 2017-11-06 NOTE — Progress Notes (Signed)
Called to room by RN stated that patient "sound wet". On arrival to patient room noted gurgling respirations at the door, very apparent audible secretions. Patient was encouraged to cough. Patient did not follow commands. Pt was NTS got back massive copious amount of tan/yellow thick secretions. Patient has a very weak ineffective cough. Patient SATs are acceptable at this time 94%. RN at bedside

## 2017-11-06 NOTE — Progress Notes (Signed)
Return call from patient's son, [name]. [name] is very angry and demanding that feeding tube be placed. He tells me his father has not been able to make decisions in 6 or 7 years. He tells me his father is deteriorating because feedings have not been started and feels we are are giving up on him. [name] is very frustrated. He will not be at the hospital until tomorrow, Sunday.   Notified attending, Dr. Truman Hayward of son's concerns.   NO CHARGE  Ihor Dow, FNP-C Palliative Medicine Team  Phone: [phone] Fax: [phone]

## 2017-11-06 NOTE — Progress Notes (Addendum)
Spoke with patient's family at bedside. Discussed low chance of recovery for the patient and imminent intubation for respiratory failure. Risks and benefits of intubation were explained to the patient. Discussed limited improvement in his status with intubation and risks of earlier demise just by attempting more aggressive treatments. Re-clarified that whether with intubation or not, his neurological symptoms are most likely to be irreversible and recovery from his stroke back to baseline is very unlikely. Patient's son expressed understanding but stated needed time to process and had to call his relatives. Explained to him that I will be available for further discussion if needed.  Spoke with family again after they spent time discussing with other relatives via phone. Patient's son, Randy [name], agrees to change to DNR, DNI status and would like to proceed with changing patient to Hospice status. He agrees to [place] putting patient on comfort care and returning to hospital tomorrow to proceed with paperwork for getting him to Hospice. Explained to him that we will continue to treat the patient's acute problems that may cause the patient discomfort but will focus on comfort over all else. [name] expressed understanding and agrees with the plan. He states he needs to go back home at the moment but will return tomorrow to meet with palliative and proceed with signing the MOST form. All other concerns from other family members were addressed.

## 2017-11-06 NOTE — Progress Notes (Signed)
  Date: 11/06/2017  Patient name: Randy [name]  Medical record number: [id number]  Date of birth: 1924/11/15   I have seen and evaluated this patient and I have discussed the plan of care with the house staff. Please see their note for complete details. I concur with their findings with the following additions/corrections: I saw and evaluated Randy [name] with Randy. Truman [name].  He is much more fatigued today but opens eyes and looks at the speaker.  He is tachypneic, in respiratory distress, is using accessory muscles, and has rales and rhonchi bilaterally.  He is requiring nonrebreather to maintain saturations.  He is unable to cough despite audible secretions.  I have noted palliative care Randy [name]'s notes in Epic.  The son has requested aggressive care including tube feeds, noninvasive ventilation, and intubation and mechanical ventilation.  My clinical assessment is that the patient will not recover from the stroke and the resulting dysphasia.  Despite n.p.o. status, he developed a right lower lobe infiltrate and is being treated for an aspiration pneumonia.  Tube feeds will not reverse the underlying process and will be uncomfortable and increase the risk of further aspiration and therefore the patient's demise.  It was my assessment that yesterday Randy [name] was not quite as fatigued and was able to make his own wishes regarding his care known to the team.  He clearly indicated that he did not want tube feeds.  This is a difficult situation as I feel the patient was clearly able to make his wishes known yesterday but those wishes contradict the son's wishes.  Since Randy [name] is my patient, I feel I must act in his best interest and honor the wishes that he communicated to the team yesterday.  I have a phone call into palliative care to further discuss the conflict between the current care being offered, i.e. lack of tube feeds, and the son's concerns and wishes.  Spoke with Randy [name]  care. She will reach out to Randy [name] - get second opinion regarding the situation.   Have placed page to ethics committee.   Randy Crews, MD 11/06/2017, 2:28 PM

## 2017-11-06 NOTE — Progress Notes (Signed)
Subjective:  Randy [name] is a 84 y.o. with PMH of PMH of HTN, HLD, CKD, CVA, prior MI status post stents, A. fib status post pacemaker  admit for L MCA CVA on hospital day 8  Randy [name] was examined and evaluated at bedside this a.m.  Observed sleeping comfortably in bed.  Upon awakening he appears less alert than yesterday.  Able to follow directions.  Dysarthria continuing to prevent him from speaking.  Denies any significant pain.  Objective:  Vital signs in last 24 hours: Vitals:   11/05/17 2014 11/05/17 2314 11/06/17 0326 11/06/17 0525  BP: (!) 148/57 133/72 130/69   Pulse: 75 74 (!) 58 74  Resp: 20 20 (!) 22 (!) 28  Temp: 97.9 F (36.6 C) 97.9 F (36.6 C) 98 F (36.7 C)   TempSrc: Oral Oral Oral   SpO2: 100% 100% 99% 96%  Weight:      Height:       Physical Exam  Constitutional: No distress.  HENT:  Mouth/Throat: No oropharyngeal exudate.  Speaking with gurgling voice  Eyes: Conjunctivae are normal. No scleral icterus.  Dilated left pupil  Neck: No JVD present.  Cardiovascular: Normal rate, regular rhythm and intact distal pulses.  Pulmonary/Chest: He is in respiratory distress (Accessory muscle use). He has rales (crackles on bilateral lower lobes).  Abdominal: Soft. Bowel sounds are normal. He exhibits no distension. There is no tenderness. There is no guarding.  Genitourinary:  Genitourinary Comments: Foley in place 200cc of yellow urine in bag  Musculoskeletal: Normal range of motion. He exhibits edema (minimal edema on lower extremities). He exhibits no deformity.  Lymphadenopathy:    He has no cervical adenopathy.  Neurological: He is alert.  AAO x1 possibly to name. Difficult to understand due to dysarthria. Strength: RUE 0/5, LUE 2/5, RLE 0/5, LLE 0/5. Unable to assess sensation.   Skin: Skin is warm and dry. He is not diaphoretic.   Assessment/Plan:  Principal Problem:   CVA (cerebral vascular accident) ([place]) Active Problems:   Hypertension  History of MI (myocardial infarction)   Diabetes mellitus ([place])   Chronic kidney disease (CKD), stage III (moderate) ([name])   Late onset Alzheimer's disease without behavioral disturbance   Acute on chronic intracranial subdural hematoma ([name])   Shortness of breath   Acute ischemic stroke ([place])   Other dysphagia   Advanced care planning/counseling discussion   Goals of care, counseling/discussion   Palliative care by specialist   At high risk for aspiration   84 year old male with PMH of HTN, HLD, CKD, CVA, prior MI status post stents, A. fib status post pacemaker admitted for left MCA CVA. Urine output significantly improved after 20 mg IV of Lasix. With Lasix on board, he may be able to correct his electrolyte abnormalities with fluid resuscitation. Will attempt gentle fluid resuscitation  Hypernatremia, Hyperchloremia, Hypokalemia 2/2 failure to thrive vs dehydration Na 158, K 3.3, Cl 128 Renal function worsening Creatinine 1.85 <- 1.79 <- 1.67 Baseline 1.7. BUN/CR >20. Received 400 cc D5W yesterday but stopped 2/2 dyspnea and crackles on exam. Will restart with gentle fluid resuscitation.  - Will start tube feeds after clarification with palliative - Restart D5W at 75cc/hr - 1 time KCl 39mEq x4 - Strict I&Os  Aspiration Pneumonia 2/2 inability to clear airway  Increasing leukocytosis stable 18.3<- 13.8 <-12.8 Continuing to have Increased work of breathing on exam. Patchy consolidation @ right mid and lower lung on X-ray. O2sat 99 on rebreather.  Crackles improving on  exam - 1 time dose of Lasix 20mg  - Keep NPO, frequent suctioning - Currently on rebreather, keep sat above 88 - C/w Unasyn per pharmacy - Sputum Culture  Urinary Retention  Foley in place. Yesterday received 20 mg IV lasix. 1200 cc output today - 20mg  IV lasix daily - C/w monitor Output  CVA 2/2leftMCA occlusion Dysarthria preventing accurate assessment of mentation. Alert and following directions but unable  to speak. Family agrees to DNR but with full scope of care and with intubation. -Appreciate palliative recs  -Neuro checks,permissive hypertension -C/w ASA, per neurosurg should not restart Coumadin -C/wAtorvastatin 80 mg PO daily - Sacral ulcer wound care per wound care team  DVT prophx:SCDs Diet: NPO Bowel:Senokot Code:DNR (intubation or bipap ok)  Dispo: Anticipated discharge in approximately 4 day(s).   Randy Anis, MD 11/06/2017, 7:05 AM Pager: [phone]

## 2017-11-07 MED ORDER — LEVALBUTEROL HCL 0.63 MG/3ML IN NEBU
0.6300 mg | INHALATION_SOLUTION | Freq: Four times a day (QID) | RESPIRATORY_TRACT | Status: DC
  Administered 2017-11-07: 0.63 mg via RESPIRATORY_TRACT
  Filled 2017-11-07 (×2): qty 3

## 2017-11-16 NOTE — Progress Notes (Signed)
Responded to pulse oximetry alarm. Patient unresponsive and pale, shallow breaths present. While in the room, patient coughed once, weakly.  As nurse stood by bed, patient stopped breathing. Telemetry showing asystole.  No pulse palpated, no heart or breath sounds auscultated. Second verification by [employer], RN. Notified MD and Ames Coupe, RN notified patient's son by telephone.  Son gave name of funeral home and stated that he did not need to come to the hospital and gave consent to move patient's remains to the morgue for pick up.  Post-mortem care to be performed and patient to be transported. Wendee Copp

## 2017-11-16 DEATH — deceased

## 2017-12-17 NOTE — Death Summary Note (Signed)
  Name: Randy [name] MRN: [id number] DOB: 22-Jun-1924 84 y.o.  Date of Admission: 11/06/2017 11:25 AM Date of Discharge: 12/03/17 07:20 AM Attending Physician: Larey Dresser  Discharge Diagnosis: Principal Problem:   CVA (cerebral vascular accident) [facility]) Active Problems:   Hypertension   History of MI (myocardial infarction)   Diabetes mellitus ([place])   Chronic kidney disease (CKD), stage III (moderate) ([place])   Late onset Alzheimer's disease without behavioral disturbance ([place])   Acute on chronic intracranial subdural hematoma ([name])   Shortness of breath   Acute ischemic stroke [facility])   Other dysphagia   Advanced care planning/counseling discussion   Goals of care, counseling/discussion   Palliative care by specialist   At high risk for aspiration - Aspiration Pneumonia  Cause of death: Respiratory failure 2/2 aspiration pneumonia Time of death: 12-03-2017 07:20 AM  Disposition and follow-up:   Randy [name] was discharged from [facility] in expired condition.    Hospital Course: Randy [name] presented with right sided deficits and severe dysarthria on warfarin at home. Found to have left MCV CVA on CTA head and neck as well as chronic subdural hematomas. His anticoagulation was stopped. MRI was not performed due to incompatible pacemaker and S&S evaluation showed he had significant dysphagia. On day 3 he had desaturation event with respiratory alkalosis diagnosed as silent aspiration and was started on glycopyrrolate with high flow nasal cannula per PCCM. From then on, he had fluctuating mentation and oxygen requirements. A family meeting on hospital day 5 resulted in patient's code status being changed to DNR but not DNI. On day 6 patient's oxygen requirements accutely worsened with purulent oropharyngeal secretions. Found to have worsening aspiration pneumonia. On day 8, patient had increasing tachycardia with desaturation. Patient's family was called to  bedside and agreed to change patient to comfort care with plans to discharge to Hospice. Patient expired on morning of 12/03/2017 07:20 AM  Signed: [name], MD 11/16/2017, 7:39 PM

## 2018-09-07 IMAGING — CR DG CHEST 2V
2 series · 2 of 2 positions shown · non-contrast
Comparison: 04/23/2014

CLINICAL DATA: Lethargy and cough

EXAM:
CHEST  2 VIEW

[w chest lat]
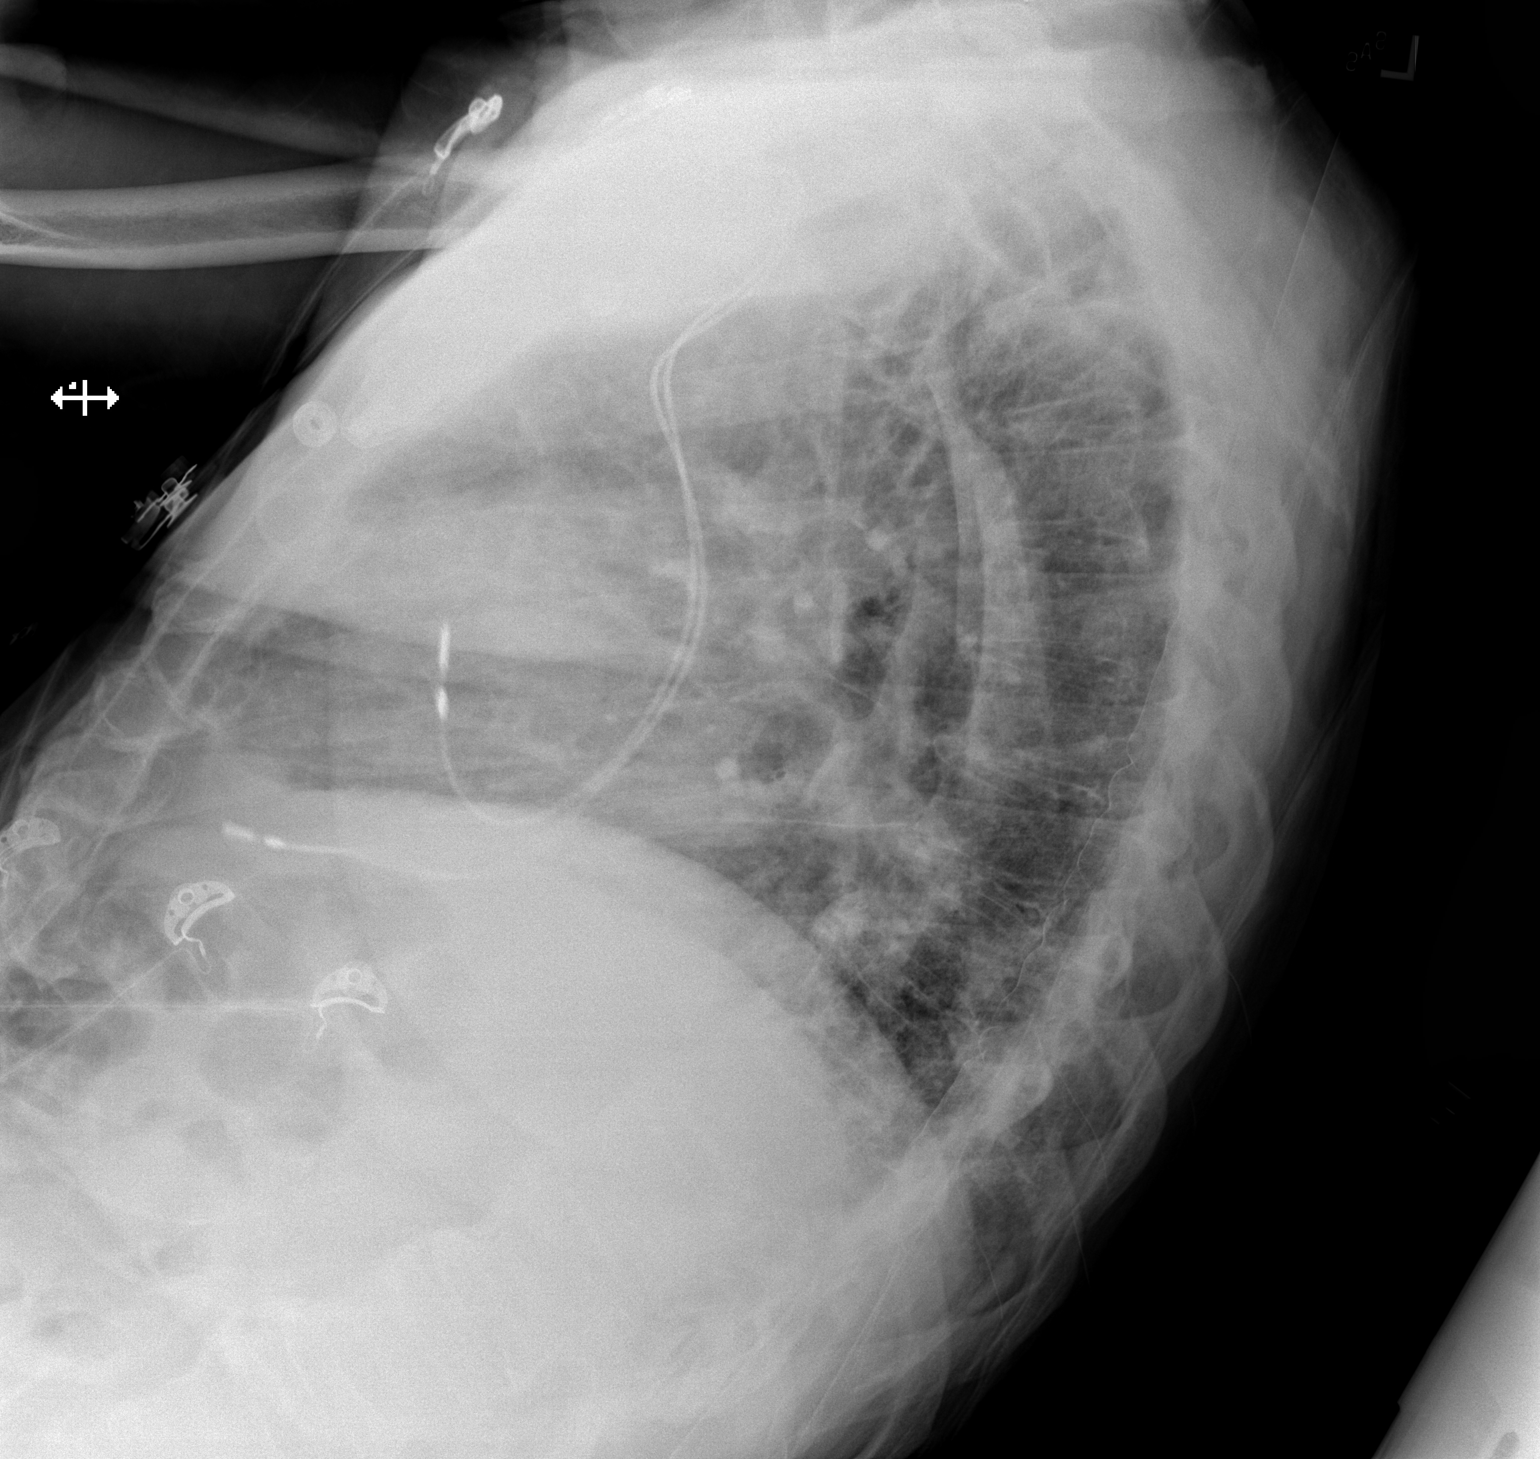

[x chest ap]
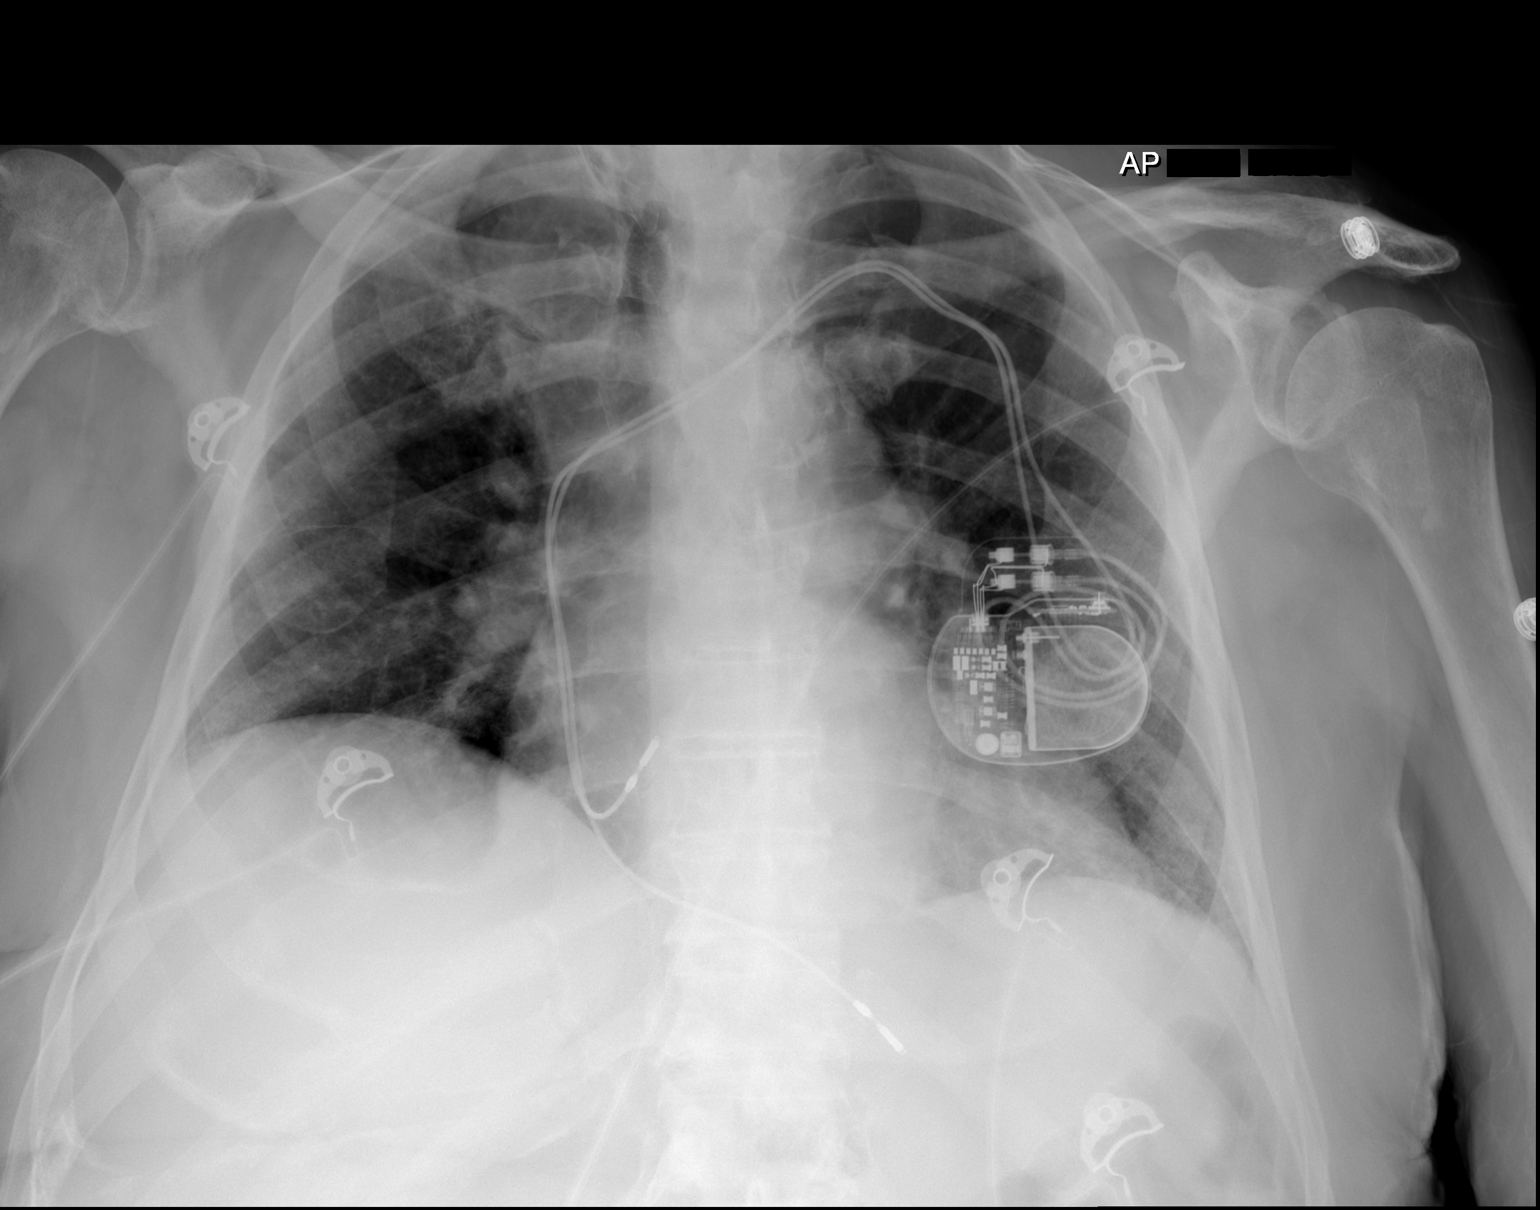

[2 of 2 positions shown; findings below may reference images not displayed]

FINDINGS: Cardiac shadow is stable. Pacing device is again noted. The lungs
are well aerated bilaterally. No bony abnormality is noted.
IMPRESSION: No active cardiopulmonary disease.

## 2018-09-07 IMAGING — CT CT CHEST W/O CM
2 of 5 series · 15 of 36 positions shown, 19 images · non-contrast
Comparison: 06/17/2016, CT 04/17/2016

CLINICAL DATA: Lethargy and cough for 2 days

EXAM:
CT CHEST WITHOUT CONTRAST
TECHNIQUE: Multidetector CT imaging of the chest was performed following the
standard protocol without IV contrast.

[Series 4: super d · axial · 0.69mm/px · z∈[+1679,+1958]mm · 14 of 315 slices shown, 18 images]
[im 18/315  mediastinal]
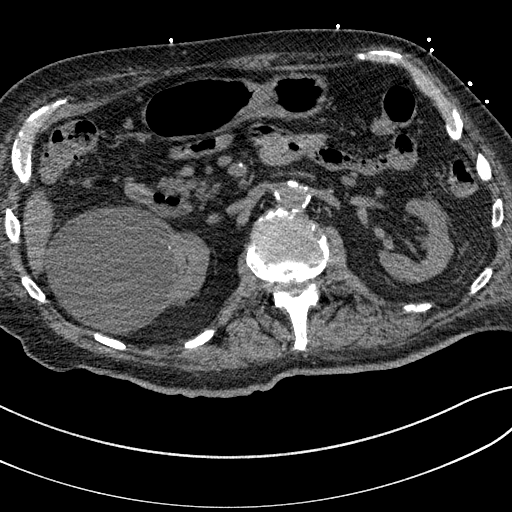
[im 18/315  lung]
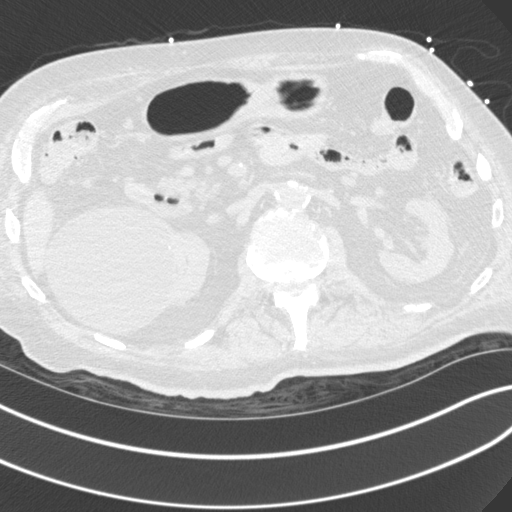
[im 35/315  lung]
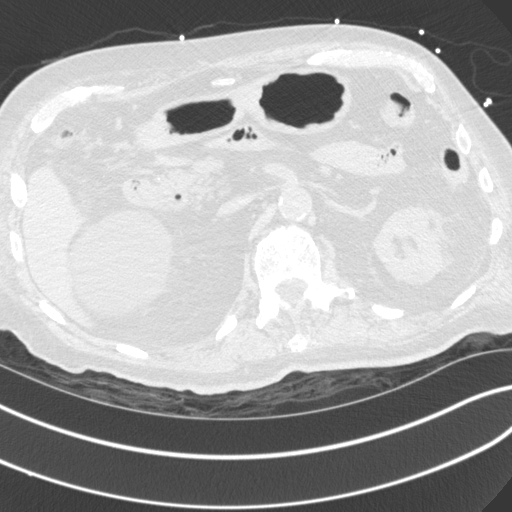
[im 70/315  lung]
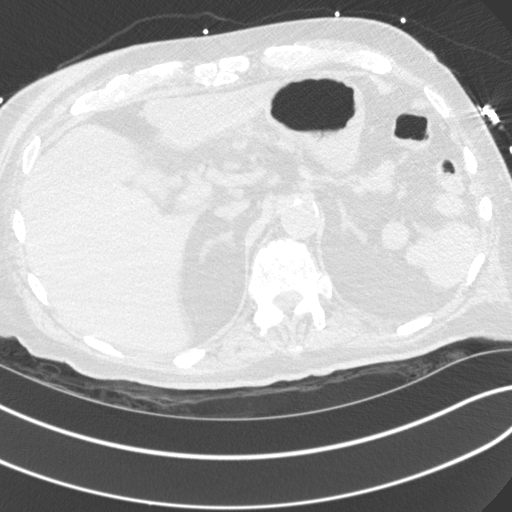
[im 88/315  lung]
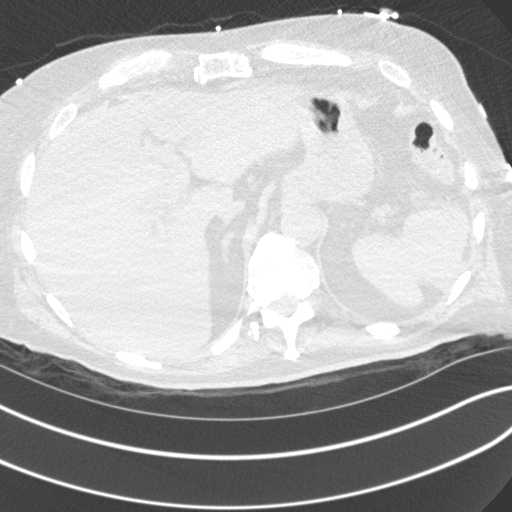
[im 105/315  mediastinal]
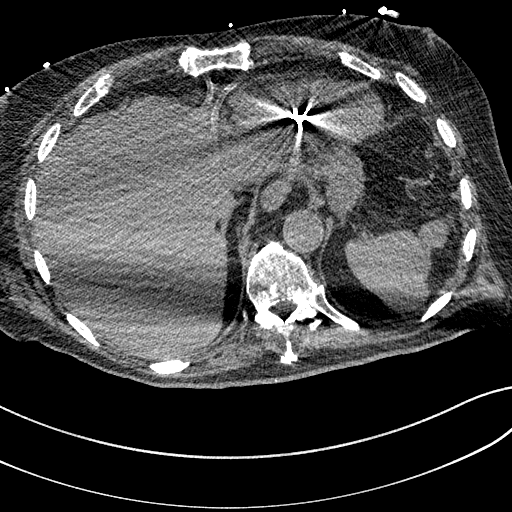
[im 105/315  lung]
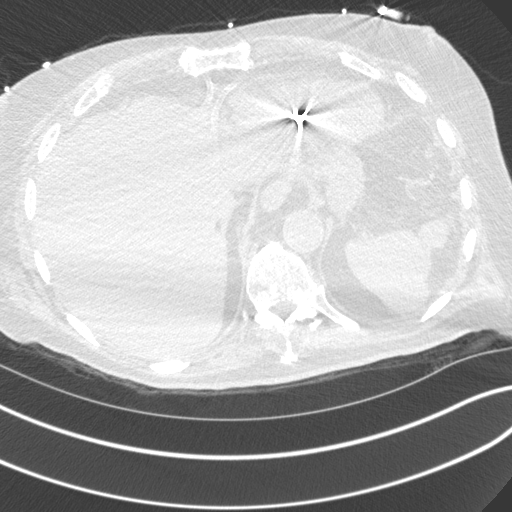
[im 123/315  lung]
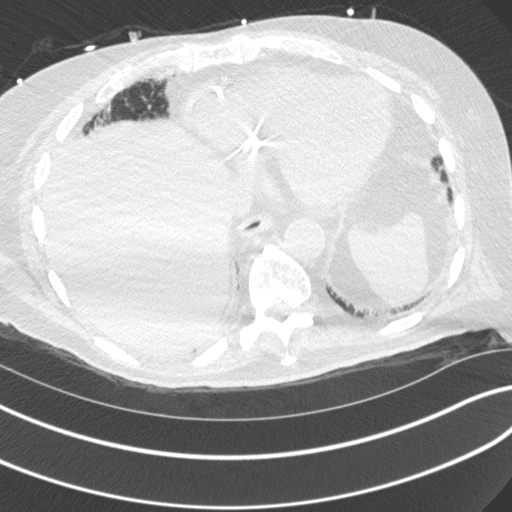
[im 140/315  lung]
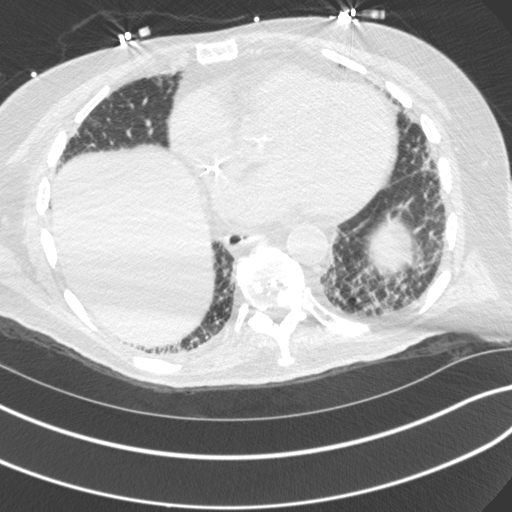
[im 175/315  lung]
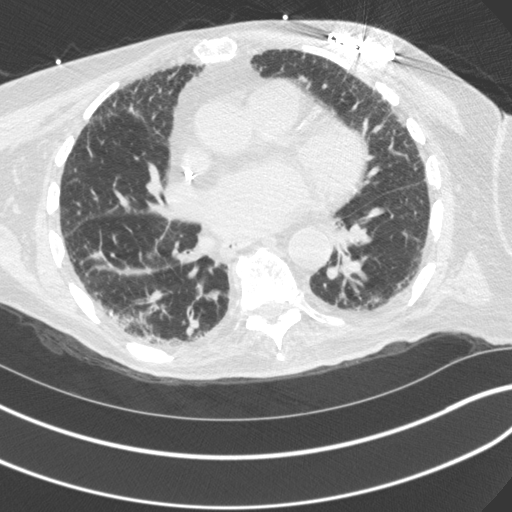
[im 192/315  mediastinal]
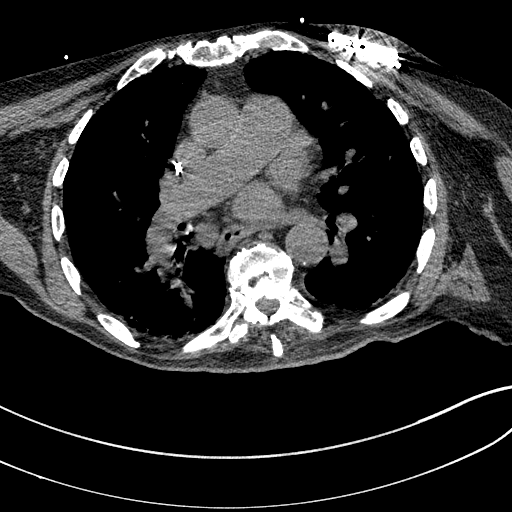
[im 192/315  lung]
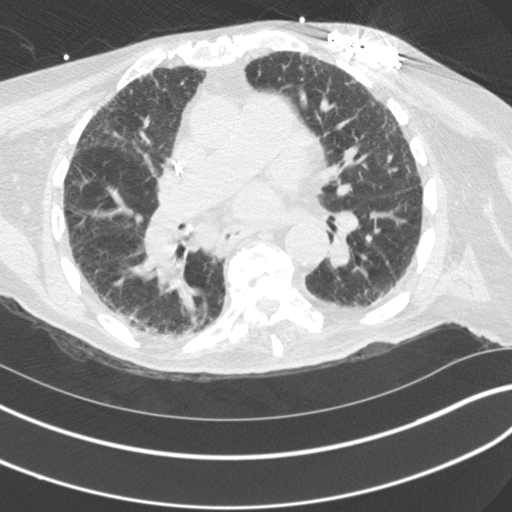
[im 210/315  lung]
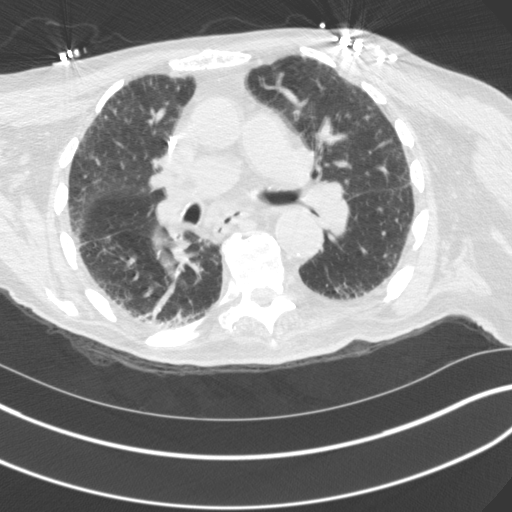
[im 227/315  lung]
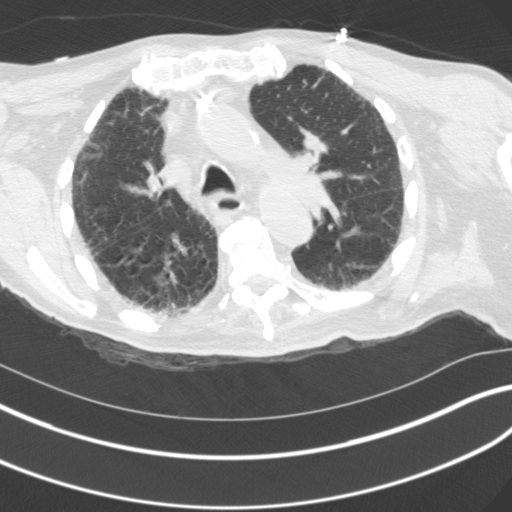
[im 245/315  lung]
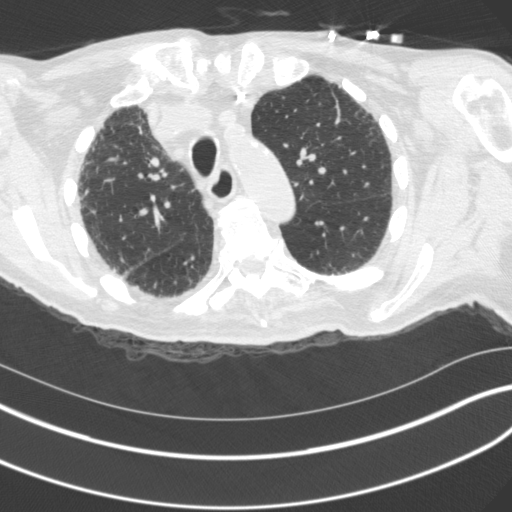
[im 280/315  mediastinal]
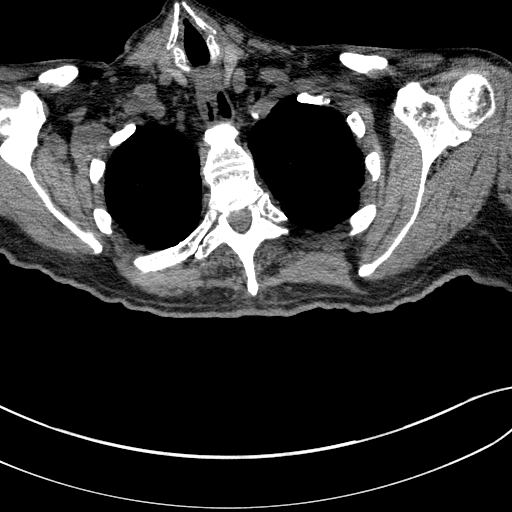
[im 280/315  lung]
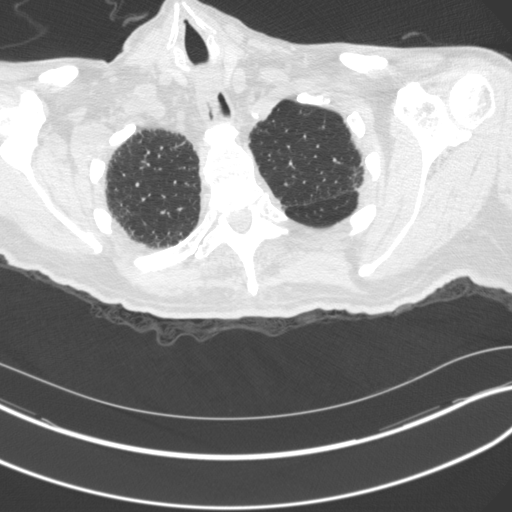
[im 297/315  lung]
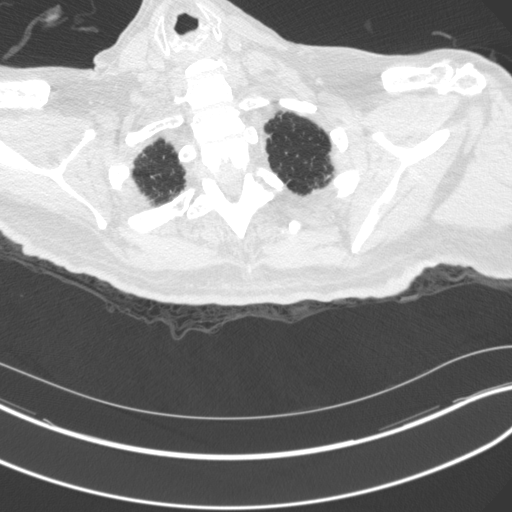

[Series 6: coronals · coronal · 0.60mm/px · 1 of 121 slices shown]
[im 61/121  lung]
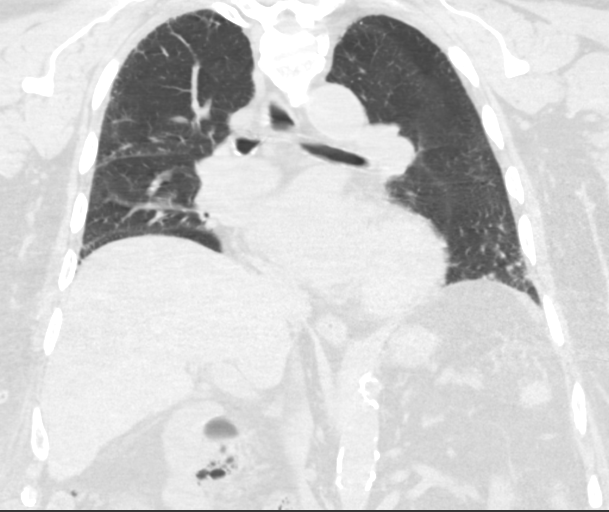

[15 of 36 positions shown; findings below may reference images not displayed]

FINDINGS: Cardiovascular: Limited evaluation without intravenous contrast.
Aortic atherosclerosis. No aneurysmal dilatation. Coronary artery
calcification. Partially visualized cardiac pacing leads. Mild
cardiomegaly. No large pericardial effusion.

Mediastinum/Nodes: Midline trachea. Few prominent sub- carinal and
periesophageal lymph nodes, measuring up to 13 mm. Esophagus within
normal limits. Esophagus

Lungs/Pleura: Mild subpleural fibrosis. No focal consolidation,
pleural effusion, or pneumothorax.

Upper Abdomen: No acute abnormality. 9.6 cm cyst in the mid to upper
right kidney. Vague hypodensity in the upper pole of the left kidney
also consistent with a cyst

Musculoskeletal: Degenerative changes of the spine. No acute or
suspicious bone lesion.
IMPRESSION: 1. Mild subpleural fibrosis. No acute pulmonary infiltrates are
visualized.
2. Mild nonspecific mediastinal adenopathy
3. Cardiomegaly
4. Bilateral kidney cysts, measuring up to 9.6 cm on the right.
